# Patient Record
Sex: Male | Born: 1960 | Race: White | Hispanic: No | Marital: Married | Smoking: Current every day smoker
Health system: Southern US, Community
[De-identification: ages and names within clinical notes are randomized; demographics above are authoritative.]

## PROBLEM LIST (undated history)

## (undated) DIAGNOSIS — F1320 Sedative, hypnotic or anxiolytic dependence, uncomplicated: Secondary | ICD-10-CM

## (undated) DIAGNOSIS — E538 Deficiency of other specified B group vitamins: Secondary | ICD-10-CM

## (undated) DIAGNOSIS — G459 Transient cerebral ischemic attack, unspecified: Secondary | ICD-10-CM

## (undated) DIAGNOSIS — F1120 Opioid dependence, uncomplicated: Secondary | ICD-10-CM

## (undated) DIAGNOSIS — I255 Ischemic cardiomyopathy: Secondary | ICD-10-CM

## (undated) DIAGNOSIS — F1910 Other psychoactive substance abuse, uncomplicated: Secondary | ICD-10-CM

## (undated) DIAGNOSIS — I509 Heart failure, unspecified: Secondary | ICD-10-CM

## (undated) DIAGNOSIS — F329 Major depressive disorder, single episode, unspecified: Secondary | ICD-10-CM

## (undated) DIAGNOSIS — I219 Acute myocardial infarction, unspecified: Secondary | ICD-10-CM

## (undated) DIAGNOSIS — N400 Enlarged prostate without lower urinary tract symptoms: Secondary | ICD-10-CM

## (undated) DIAGNOSIS — G8929 Other chronic pain: Secondary | ICD-10-CM

## (undated) DIAGNOSIS — Z95810 Presence of automatic (implantable) cardiac defibrillator: Secondary | ICD-10-CM

## (undated) DIAGNOSIS — F419 Anxiety disorder, unspecified: Secondary | ICD-10-CM

## (undated) DIAGNOSIS — Z8547 Personal history of malignant neoplasm of testis: Secondary | ICD-10-CM

## (undated) DIAGNOSIS — F32A Depression, unspecified: Secondary | ICD-10-CM

## (undated) DIAGNOSIS — I2510 Atherosclerotic heart disease of native coronary artery without angina pectoris: Secondary | ICD-10-CM

## (undated) DIAGNOSIS — M549 Dorsalgia, unspecified: Secondary | ICD-10-CM

## (undated) DIAGNOSIS — M1990 Unspecified osteoarthritis, unspecified site: Secondary | ICD-10-CM

## (undated) DIAGNOSIS — G4733 Obstructive sleep apnea (adult) (pediatric): Secondary | ICD-10-CM

## (undated) DIAGNOSIS — D751 Secondary polycythemia: Secondary | ICD-10-CM

## (undated) HISTORY — DX: Other chronic pain: G89.29

## (undated) HISTORY — DX: Personal history of malignant neoplasm of testis: Z85.47

## (undated) HISTORY — DX: Benign prostatic hyperplasia without lower urinary tract symptoms: N40.0

## (undated) HISTORY — DX: Ischemic cardiomyopathy: I25.5

## (undated) HISTORY — DX: Deficiency of other specified B group vitamins: E53.8

## (undated) HISTORY — DX: Major depressive disorder, single episode, unspecified: F32.9

## (undated) HISTORY — DX: Secondary polycythemia: D75.1

## (undated) HISTORY — DX: Heart failure, unspecified: I50.9

## (undated) HISTORY — DX: Unspecified osteoarthritis, unspecified site: M19.90

## (undated) HISTORY — DX: Opioid dependence, uncomplicated: F11.20

## (undated) HISTORY — DX: Presence of automatic (implantable) cardiac defibrillator: Z95.810

## (undated) HISTORY — DX: Atherosclerotic heart disease of native coronary artery without angina pectoris: I25.10

## (undated) HISTORY — PX: ASD REPAIR, SINUS VENOSUS: SHX1196

## (undated) HISTORY — PX: TESTICLE SURGERY: SHX794

## (undated) HISTORY — DX: Depression, unspecified: F32.A

## (undated) HISTORY — DX: Sedative, hypnotic or anxiolytic dependence, uncomplicated: F13.20

## (undated) HISTORY — DX: Anxiety disorder, unspecified: F41.9

## (undated) HISTORY — DX: Chronic systolic (congestive) heart failure: I50.22

## (undated) HISTORY — DX: Dorsalgia, unspecified: M54.9

## (undated) HISTORY — PX: HERNIA REPAIR: SHX51

---

## 2002-12-08 ENCOUNTER — Ambulatory Visit (HOSPITAL_COMMUNITY): Admission: RE | Admit: 2002-12-08 | Discharge: 2002-12-08 | Payer: Self-pay | Admitting: Cardiology

## 2003-08-13 ENCOUNTER — Ambulatory Visit (HOSPITAL_COMMUNITY): Admission: RE | Admit: 2003-08-13 | Discharge: 2003-08-13 | Payer: Self-pay | Admitting: Cardiology

## 2004-10-25 ENCOUNTER — Emergency Department (HOSPITAL_COMMUNITY): Admission: EM | Admit: 2004-10-25 | Discharge: 2004-10-25 | Payer: Self-pay | Admitting: Emergency Medicine

## 2005-05-17 ENCOUNTER — Observation Stay (HOSPITAL_COMMUNITY): Admission: EM | Admit: 2005-05-17 | Discharge: 2005-05-18 | Payer: Self-pay | Admitting: Emergency Medicine

## 2005-05-20 ENCOUNTER — Emergency Department (HOSPITAL_COMMUNITY): Admission: EM | Admit: 2005-05-20 | Discharge: 2005-05-20 | Payer: Self-pay | Admitting: Emergency Medicine

## 2005-07-24 ENCOUNTER — Ambulatory Visit: Payer: Self-pay | Admitting: Oncology

## 2005-08-04 ENCOUNTER — Ambulatory Visit (HOSPITAL_COMMUNITY): Admission: RE | Admit: 2005-08-04 | Discharge: 2005-08-04 | Payer: Self-pay | Admitting: *Deleted

## 2005-08-05 ENCOUNTER — Ambulatory Visit (HOSPITAL_COMMUNITY): Admission: RE | Admit: 2005-08-05 | Discharge: 2005-08-05 | Payer: Self-pay | Admitting: *Deleted

## 2006-05-26 ENCOUNTER — Encounter: Admission: RE | Admit: 2006-05-26 | Discharge: 2006-05-26 | Payer: Self-pay | Admitting: Specialist

## 2006-09-17 ENCOUNTER — Ambulatory Visit: Payer: Self-pay | Admitting: Critical Care Medicine

## 2006-09-17 ENCOUNTER — Inpatient Hospital Stay (HOSPITAL_COMMUNITY): Admission: EM | Admit: 2006-09-17 | Discharge: 2006-09-20 | Payer: Self-pay | Admitting: Emergency Medicine

## 2006-09-29 ENCOUNTER — Ambulatory Visit: Payer: Self-pay | Admitting: Critical Care Medicine

## 2007-02-09 ENCOUNTER — Ambulatory Visit: Payer: Self-pay | Admitting: Internal Medicine

## 2007-02-15 ENCOUNTER — Ambulatory Visit (HOSPITAL_COMMUNITY): Admission: RE | Admit: 2007-02-15 | Discharge: 2007-02-15 | Payer: Self-pay | Admitting: Internal Medicine

## 2010-07-30 ENCOUNTER — Telehealth (INDEPENDENT_AMBULATORY_CARE_PROVIDER_SITE_OTHER): Payer: Self-pay | Admitting: *Deleted

## 2010-08-03 ENCOUNTER — Encounter: Payer: Self-pay | Admitting: Specialist

## 2010-08-03 ENCOUNTER — Encounter: Payer: Self-pay | Admitting: *Deleted

## 2010-08-07 ENCOUNTER — Telehealth (INDEPENDENT_AMBULATORY_CARE_PROVIDER_SITE_OTHER): Payer: Self-pay | Admitting: *Deleted

## 2010-08-07 ENCOUNTER — Inpatient Hospital Stay (HOSPITAL_COMMUNITY)
Admission: EM | Admit: 2010-08-07 | Discharge: 2010-08-09 | Payer: Self-pay | Source: Home / Self Care | Attending: Cardiology | Admitting: Cardiology

## 2010-08-07 LAB — COMPREHENSIVE METABOLIC PANEL WITH GFR
ALT: 23 U/L (ref 0–53)
AST: 16 U/L (ref 0–37)
Albumin: 4.3 g/dL (ref 3.5–5.2)
Alkaline Phosphatase: 72 U/L (ref 39–117)
BUN: 16 mg/dL (ref 6–23)
CO2: 24 meq/L (ref 19–32)
Calcium: 9.1 mg/dL (ref 8.4–10.5)
Chloride: 104 meq/L (ref 96–112)
Creatinine, Ser: 1.11 mg/dL (ref 0.4–1.5)
GFR calc non Af Amer: >60 mL/min
Glucose, Bld: 100 mg/dL — ABNORMAL HIGH (ref 70–99)
Potassium: 4.2 meq/L (ref 3.5–5.1)
Sodium: 135 meq/L (ref 135–145)
Total Bilirubin: 0.3 mg/dL (ref 0.3–1.2)
Total Protein: 7.2 g/dL (ref 6.0–8.3)

## 2010-08-07 LAB — CBC
HCT: 51 % (ref 39.0–52.0)
Hemoglobin: 16.6 g/dL (ref 13.0–17.0)
MCH: 29.8 pg (ref 26.0–34.0)
MCHC: 32.5 g/dL (ref 30.0–36.0)
MCV: 91.6 fL (ref 78.0–100.0)
Platelets: 153 10*3/uL (ref 150–400)
RBC: 5.57 MIL/uL (ref 4.22–5.81)
RDW: 13.4 % (ref 11.5–15.5)
WBC: 6.8 10*3/uL (ref 4.0–10.5)

## 2010-08-07 LAB — DIFFERENTIAL
Basophils Absolute: 0 10*3/uL (ref 0.0–0.1)
Basophils Relative: 0 % (ref 0–1)
Eosinophils Absolute: 0.3 10*3/uL (ref 0.0–0.7)
Eosinophils Relative: 4 % (ref 0–5)
Lymphocytes Relative: 35 % (ref 12–46)
Lymphs Abs: 2.4 10*3/uL (ref 0.7–4.0)
Monocytes Absolute: 0.4 10*3/uL (ref 0.1–1.0)
Monocytes Relative: 6 % (ref 3–12)
Neutro Abs: 3.8 10*3/uL (ref 1.7–7.7)
Neutrophils Relative %: 55 % (ref 43–77)

## 2010-08-07 LAB — POCT I-STAT 3, VENOUS BLOOD GAS (G3P V)
Acid-Base Excess: 1 mmol/L (ref 0.0–2.0)
Bicarbonate: 25.6 mEq/L — ABNORMAL HIGH (ref 20.0–24.0)
O2 Saturation: 97 %
TCO2: 27 mmol/L (ref 0–100)
pCO2, Ven: 40.6 mmHg — ABNORMAL LOW (ref 45.0–50.0)
pH, Ven: 7.409 — ABNORMAL HIGH (ref 7.250–7.300)
pO2, Ven: 92 mmHg — ABNORMAL HIGH (ref 30.0–45.0)

## 2010-08-07 LAB — CK TOTAL AND CKMB (NOT AT ARMC)
CK, MB: 3 ng/mL (ref 0.3–4.0)
Relative Index: 2.7 — ABNORMAL HIGH (ref 0.0–2.5)
Total CK: 111 U/L (ref 7–232)

## 2010-08-07 LAB — RAPID URINE DRUG SCREEN, HOSP PERFORMED
Amphetamines: NOT DETECTED
Barbiturates: NOT DETECTED
Benzodiazepines: POSITIVE — AB
Cocaine: NOT DETECTED
Opiates: POSITIVE — AB
Tetrahydrocannabinol: POSITIVE — AB

## 2010-08-07 LAB — APTT: aPTT: 29 s (ref 24–37)

## 2010-08-07 LAB — HEMOGLOBIN A1C
Hgb A1c MFr Bld: 5.7 % — ABNORMAL HIGH
Mean Plasma Glucose: 117 mg/dL — ABNORMAL HIGH

## 2010-08-07 LAB — PROTIME-INR
INR: 1.01 (ref 0.00–1.49)
Prothrombin Time: 13.5 s (ref 11.6–15.2)

## 2010-08-07 LAB — TSH: TSH: 0.757 u[IU]/mL (ref 0.350–4.500)

## 2010-08-07 LAB — TROPONIN I: Troponin I: <0.01 ng/mL (ref 0.00–0.06)

## 2010-08-07 LAB — BRAIN NATRIURETIC PEPTIDE: Pro B Natriuretic peptide (BNP): 66 pg/mL (ref 0.0–100.0)

## 2010-08-08 LAB — POCT ACTIVATED CLOTTING TIME
Activated Clotting Time: 140 seconds
Activated Clotting Time: 605 seconds

## 2010-08-08 LAB — CBC
HCT: 49 % (ref 39.0–52.0)
HCT: 50.8 % (ref 39.0–52.0)
Hemoglobin: 17 g/dL (ref 13.0–17.0)
Hemoglobin: 17.5 g/dL — ABNORMAL HIGH (ref 13.0–17.0)
MCH: 31.5 pg (ref 26.0–34.0)
MCH: 31.5 pg (ref 26.0–34.0)
MCHC: 34.7 g/dL (ref 30.0–36.0)
MCHC: 34.4 g/dL (ref 30.0–36.0)
MCV: 90.7 fL (ref 78.0–100.0)
MCV: 91.5 fL (ref 78.0–100.0)
Platelets: 141 10*3/uL — ABNORMAL LOW (ref 150–400)
Platelets: 138 10*3/uL — ABNORMAL LOW (ref 150–400)
RBC: 5.4 MIL/uL (ref 4.22–5.81)
RBC: 5.55 MIL/uL (ref 4.22–5.81)
RDW: 13.5 % (ref 11.5–15.5)
RDW: 13.5 % (ref 11.5–15.5)
WBC: 6.1 10*3/uL (ref 4.0–10.5)
WBC: 5.9 10*3/uL (ref 4.0–10.5)

## 2010-08-08 LAB — HEPARIN LEVEL (UNFRACTIONATED)
Heparin Unfractionated: 0.16 IU/mL — ABNORMAL LOW (ref 0.30–0.70)
Heparin Unfractionated: 0.63 IU/mL (ref 0.30–0.70)
Heparin Unfractionated: <0.1 IU/mL — ABNORMAL LOW (ref 0.30–0.70)

## 2010-08-08 LAB — PLATELET INHIBITION P2Y12
P2Y12 % Inhibition: 28 %
Platelet Function  P2Y12: 128 [PRU] — ABNORMAL LOW (ref 194–418)
Platelet Function Baseline: 177 [PRU] — ABNORMAL LOW (ref 194–418)

## 2010-08-08 LAB — CARDIAC PANEL(CRET KIN+CKTOT+MB+TROPI)
CK, MB: 2.4 ng/mL (ref 0.3–4.0)
CK, MB: 3 ng/mL (ref 0.3–4.0)
CK, MB: 2.7 ng/mL (ref 0.3–4.0)
Relative Index: 2.4 (ref 0.0–2.5)
Relative Index: 2.9 — ABNORMAL HIGH (ref 0.0–2.5)
Relative Index: INVALID (ref 0.0–2.5)
Total CK: 100 U/L (ref 7–232)
Total CK: 104 U/L (ref 7–232)
Total CK: 98 U/L (ref 7–232)
Troponin I: 0.01 ng/mL (ref 0.00–0.06)
Troponin I: <0.01 ng/mL (ref 0.00–0.06)
Troponin I: <0.01 ng/mL (ref 0.00–0.06)

## 2010-08-08 LAB — GLUCOSE, CAPILLARY: Glucose-Capillary: 105 mg/dL — ABNORMAL HIGH (ref 70–99)

## 2010-08-09 LAB — CBC
HCT: 48.6 % (ref 39.0–52.0)
Hemoglobin: 16.6 g/dL (ref 13.0–17.0)
MCH: 30.9 pg (ref 26.0–34.0)
MCHC: 34.2 g/dL (ref 30.0–36.0)
MCV: 90.5 fL (ref 78.0–100.0)
Platelets: 138 10*3/uL — ABNORMAL LOW (ref 150–400)
RBC: 5.37 MIL/uL (ref 4.22–5.81)
RDW: 13.5 % (ref 11.5–15.5)
WBC: 7.1 10*3/uL (ref 4.0–10.5)

## 2010-08-09 LAB — BASIC METABOLIC PANEL
BUN: 13 mg/dL (ref 6–23)
CO2: 26 mEq/L (ref 19–32)
Calcium: 9 mg/dL (ref 8.4–10.5)
Chloride: 104 mEq/L (ref 96–112)
Creatinine, Ser: 1.08 mg/dL (ref 0.4–1.5)
GFR calc Af Amer: >60 mL/min (ref >60)
GFR calc non Af Amer: >60 mL/min (ref >60)
Glucose, Bld: 99 mg/dL (ref 70–99)
Potassium: 3.9 mEq/L (ref 3.5–5.1)
Sodium: 139 mEq/L (ref 135–145)

## 2010-08-12 ENCOUNTER — Encounter: Payer: Self-pay | Admitting: Internal Medicine

## 2010-08-12 ENCOUNTER — Telehealth: Payer: Self-pay | Admitting: Internal Medicine

## 2010-08-13 ENCOUNTER — Observation Stay (HOSPITAL_COMMUNITY)
Admission: RE | Admit: 2010-08-13 | Discharge: 2010-08-14 | Disposition: A | Discharge status: Home / Self Care | Payer: BC Managed Care – PPO | Source: Ambulatory Visit | Attending: Internal Medicine | Admitting: Internal Medicine

## 2010-08-13 DIAGNOSIS — G8929 Other chronic pain: Secondary | ICD-10-CM | POA: Insufficient documentation

## 2010-08-13 DIAGNOSIS — I2589 Other forms of chronic ischemic heart disease: Principal | ICD-10-CM | POA: Insufficient documentation

## 2010-08-13 DIAGNOSIS — M4800 Spinal stenosis, site unspecified: Secondary | ICD-10-CM | POA: Insufficient documentation

## 2010-08-13 DIAGNOSIS — I498 Other specified cardiac arrhythmias: Secondary | ICD-10-CM | POA: Insufficient documentation

## 2010-08-13 DIAGNOSIS — Z8547 Personal history of malignant neoplasm of testis: Secondary | ICD-10-CM | POA: Insufficient documentation

## 2010-08-13 DIAGNOSIS — G4733 Obstructive sleep apnea (adult) (pediatric): Secondary | ICD-10-CM | POA: Insufficient documentation

## 2010-08-13 DIAGNOSIS — Z7902 Long term (current) use of antithrombotics/antiplatelets: Secondary | ICD-10-CM | POA: Insufficient documentation

## 2010-08-13 DIAGNOSIS — I2510 Atherosclerotic heart disease of native coronary artery without angina pectoris: Secondary | ICD-10-CM | POA: Insufficient documentation

## 2010-08-13 DIAGNOSIS — Z79899 Other long term (current) drug therapy: Secondary | ICD-10-CM | POA: Insufficient documentation

## 2010-08-13 DIAGNOSIS — F1920 Other psychoactive substance dependence, uncomplicated: Secondary | ICD-10-CM | POA: Insufficient documentation

## 2010-08-13 DIAGNOSIS — F17200 Nicotine dependence, unspecified, uncomplicated: Secondary | ICD-10-CM | POA: Insufficient documentation

## 2010-08-13 DIAGNOSIS — F1320 Sedative, hypnotic or anxiolytic dependence, uncomplicated: Secondary | ICD-10-CM | POA: Insufficient documentation

## 2010-08-13 DIAGNOSIS — F329 Major depressive disorder, single episode, unspecified: Secondary | ICD-10-CM | POA: Insufficient documentation

## 2010-08-13 DIAGNOSIS — N400 Enlarged prostate without lower urinary tract symptoms: Secondary | ICD-10-CM | POA: Insufficient documentation

## 2010-08-13 DIAGNOSIS — F3289 Other specified depressive episodes: Secondary | ICD-10-CM | POA: Insufficient documentation

## 2010-08-13 HISTORY — PX: CARDIAC DEFIBRILLATOR PLACEMENT: SHX171

## 2010-08-13 LAB — SURGICAL PCR SCREEN
MRSA, PCR: NEGATIVE
Staphylococcus aureus: NEGATIVE

## 2010-08-14 ENCOUNTER — Inpatient Hospital Stay (HOSPITAL_COMMUNITY): Payer: BC Managed Care – PPO

## 2010-08-14 NOTE — Progress Notes (Signed)
  Request for Records received form [facility] Rotenstre[facility] [place] sent to [employer]  July 30, 2010 12:51 PM

## 2010-08-14 NOTE — Progress Notes (Signed)
Summary: pt's dad needs to speak with [name]  Phone Note Call from Patient   Caller: [phone] pt's [name] Reason for Call: Talk to Nurse Summary of Call: pt's dad [name] needs to talk with you re pt, said he has been seen here before, but I don't see any records of it Initial call taken by: [name],  August 07, 2010 1:28 PM  Follow-up for Phone Call        per family request  will go see pt in the ER [name] and [name] aware [name], RN, BSN  August 07, 2010 2:06 PM

## 2010-08-15 ENCOUNTER — Telehealth: Payer: Self-pay | Admitting: Internal Medicine

## 2010-08-18 ENCOUNTER — Encounter: Payer: Self-pay | Admitting: Internal Medicine

## 2010-08-20 NOTE — Progress Notes (Signed)
Summary: question re procedure  Phone Note Call from Patient Call back at [phone]   Caller: Patient Reason for Call: Talk to Nurse Summary of Call: pt has question re his procedure done in the hospital Cardiac catheterization pacermaker. pt has question re what he needs to do next. pt wants to know what he does re the bandage/can he shower? Initial call taken by: Regan Lemming,  August 15, 2010 11:05 AM  Follow-up for Phone Call        Pt. has discharge instructions from hospital.  Pt still has large bandage over defib insertion site. I told pt he could remove this dressing as it had been 24 hours after insertion.  He is aware there are steri strips under dressing and understands instructions as on dc sheet for these.  Aware he can not shower for one week.  Pt also asking regarding Merlin. Pt informed this will be started after follow up appt with Dr. Caryl Comes and he will be given information on this at office visit with Dr. Caryl Comes. Follow-up by: Thompson Grayer, RN, BSN,  August 15, 2010 11:34 AM

## 2010-08-20 NOTE — Progress Notes (Signed)
Summary: pt needs know if he is getting defib tomorrow  Phone Note Call from Patient Call back at [phone]   Caller: Patient Reason for Call: Talk to Nurse, Talk to Doctor Summary of Call: pt thinks he is to get a defib implanted tomorrow but he has not heard anything Initial call taken by: Shelda Pal,  August 12, 2010 12:32 PM  Follow-up for Phone Call        08/12/10-1320pm--pt calling wanting to know if defib implant is going to happen tomorrow--after speaking with dr Caryl Comes i went ahead and sched procedure for 1pm 08/13/10 with anesthesia available--i also notified pt to be at short stay,NPO, at 11am for 100pm procedure--pt agrees Follow-up by: Leodis Sias, RN,  August 12, 2010 1:21 PM

## 2010-08-27 ENCOUNTER — Ambulatory Visit: Payer: BC Managed Care – PPO

## 2010-08-28 NOTE — Miscellaneous (Signed)
Summary: Device preload  Clinical Lists Changes  Observations: Added new observation of ICD INDICATN: ICM (08/18/2010 12:27) Added new observation of ICDLEADSTAT2: active (08/18/2010 12:27) Added new observation of ICDLEADSER2: [id number] (08/18/2010 12:27) Added new observation of ICDLEADMOD2: 1423T (08/18/2010 12:27) Added new observation of ICDLEADLOC2: RV (08/18/2010 12:27) Added new observation of ICDLEADSTAT1: active (08/18/2010 12:27) Added new observation of ICDLEADSER1: [id number] (08/18/2010 12:27) Added new observation of ICDLEADMOD1: 5686HU (08/18/2010 12:27) Added new observation of ICDLEADDOI2: 08/13/2010 (08/18/2010 12:27) Added new observation of ICDLEADDOI1: 08/13/2010 (08/18/2010 12:27) Added new observation of ICDLEADLOC1: RA (08/18/2010 12:27) Added new observation of ICD IMP MD: Virl Axe, MD (08/18/2010 12:27) Added new observation of ICD IMPL DTE: 08/13/2010 (08/18/2010 12:27) Added new observation of ICD SERL#: [id number]  (08/18/2010 12:27) Added new observation of ICD MODL#: [id number]  (08/18/2010 12:27) Added new observation of ICDMANUFACTR: St Jude  (08/18/2010 12:27) Added new observation of ICD MD: Virl Axe, MD  (08/18/2010 12:27)       ICD Specifications Following MD:  Virl Axe, MD     ICD Vendor:  St Jude     ICD Model Number:  [id number]     ICD Serial Number:  [id number] ICD DOI:  08/13/2010     ICD Implanting MD:  Virl Axe, MD  Lead 1:    Location: RA     DOI: 08/13/2010     Model #: [id number]     Serial #: [id number]     Status: active Lead 2:    Location: RV     DOI: 08/13/2010     Model #: [id number]     Serial #: [id number]     Status: active  Indications::  ICM

## 2010-09-01 NOTE — Op Note (Signed)
[name], [name]                ACCOUNT NO.:  [id number]  MEDICAL RECORD NO.:  [id number]           PATIENT TYPE:  I  LOCATION:  2035                         FACILITY:  [facility]  PHYSICIAN:  Deboraha Sprang, MD, FACCDATE OF BIRTH:  1961/05/11  DATE OF PROCEDURE:  08/13/2010 DATE OF DISCHARGE:                              OPERATIVE REPORT   PREOPERATIVE DIAGNOSES:  Ischemic cardiomyopathy and depressed left ventricular function and some degree of bradycardia.  POSTOPERATIVE DIAGNOSES:  Ischemic cardiomyopathy and depressed left ventricular function and some degree of bradycardia.  PROCEDURE:  Dual-chamber defibrillator implantation with intraoperative defibrillation threshold testing.  Following obtaining informed consent, the patient was brought to electrophysiology laboratory and placed on the fluoroscopic table in the supine position.  After routine prep and drape of the left upper chest and under sedation delivered by Dr. Myriam Jacobson, the patient was prepped and draped and then, the patient's left subclavian area was locally anesthetized.  An incision was made and carried down to layer of the prepectoral fascia.  Using electrocautery and sharp dissection, a pocket was formed similarly.  Hemostasis was obtained.  Thereafter, attention was turned to gaining access to the external thoracic left subclavian vein which was accomplished with mild difficulty, but without the aspiration of air or puncture of the artery. Two separate venipunctures were accomplished, guidewires were placed and retained, and sequentially an 8-French and 7-French sheaths were placed which were pass a St. Jude Durata single coil defibrillator lead, model [id number], serial number [id number] and a St. Jude 2080 TC asphyxiation atrial lead, serial number [id number].  Under fluoroscopic guidance, these were manipulated to the right ventricular apex and the right atrial appendage respectively with a bipolar R-wave of  1.6 with a pace impedance of 649, threshold 0.5 at 0.5, current of threshold 0.7 mA. There was no diaphragmatic pacing at 10 V and the current of injury was brisk.  Bipolar P-wave is 3.6 with a pace impedance of 674, a threshold of 1.2 V at 0.5 msec, current of threshold 1.7 mA and there was no diaphragmatic pacing at 10 volts, the current of injury was brisk.  With these acceptable parameters recorded, the leads were attached to a [place]lock [id number] ICD, serial number [id number].  Through the device, bipolar P-wave was 2.8 with a pace impedance of 630, a threshold of 1.75 at 0.5 and the R-wave was 12 with a pace impedance of 6 and a threshold of 0.5 at 0.5, high-voltage impedance was 72 ohms.  With these acceptable parameters recorded, defibrillation threshold testing was undertaken.  Ventricular fibrillation was induced via the T- wave shock.  After a total duration of 6 seconds, the 15 J shock was delivered through a measured resistance of 72 ohms, terminating ventricular fibrillation and restoring sinus rhythm.  The device was implanted.  The pocket was copiously irrigated with antibiotic- containing saline solution.  Hemostasis was assured.  The leads and the pulse generator were placed in the pocket secured to the prepectoral fascia.  The wound was closed in 2 layers in the normal fashion.  The wound was washed, dried, and Benzoin, Steri-Strip dressing  was applied.  Needle counts, sponge counts, and instrument counts were correct at the end of the procedure according to staff, and the patient tolerated the procedure without apparent complication.     Deboraha Sprang, MD, [facility]     SCK/MEDQ  D:  08/13/2010  T:  08/14/2010  Job:  [id number]  Electronically Signed by Virl Axe MD [facility] on 09/01/2010 09:54:25 PM

## 2010-09-01 NOTE — Discharge Summary (Signed)
Sandoval, Trevor                ACCOUNT NO.:  [id number]  MEDICAL RECORD NO.:  [id number]           PATIENT TYPE:  I  LOCATION:  2035                         FACILITY:  [facility]  PHYSICIAN:  Deboraha Sprang, MD, FACCDATE OF BIRTH:  Aug 30, 1960  DATE OF ADMISSION:  08/13/2010 DATE OF DISCHARGE:  08/14/2010                              DISCHARGE SUMMARY   PRIMARY DIAGNOSIS:  Ischemic cardiomyopathy with ejection fraction of 20% to 30%.  SECONDARY DIAGNOSES: 1. Ventricular ectopy. 2. Tobacco abuse. 3. Chronic narcotic and benzodiazepine dependence. 4. Chronic back pain from lumbar stenosis. 5. Obstructive sleep apnea. 6. Benign prostatic hypertrophy. 7. Depression. 8. History of testicular cancer.  ALLERGIES:  The patient is allergic to NUBAIN.  PROCEDURES THIS ADMISSION:  Implantation of a dual-chamber St. Jude Medical ICD on August 13, 2010, by Dr. Caryl Comes.  The patient received a model number 6734-19F ICD with a model number 2088TC right atrial lead, model number 7902I right ventricular lead.  DFTs at the time of implant were less than or equal to 15 joules.  The patient had no early apparent complications.  He was enrolled in the AnalyzeST study.  BRIEF HISTORY OF PRESENT ILLNESS:  [name] is a 50 year old male with a history of ischemic cardiomyopathy.  For him, ICD implantation had been recommended, but deferred in the past.  He was admitted in January 2012 with chest pain and underwent cardiac catheterization at that time. There were no options for revascularizations.  He was evaluated by Dr. Caryl Comes that admission for risk stratification for sudden death. Recommendation included implantation of primary prevention ICD.  Risks, benefits, and alternatives of the procedure were discussed with the patient and he wished to proceed.  HOSPITAL COURSE:  The patient was admitted on August 13, 2010, with planned implantation of ICD.  This was carried out by Dr. Caryl Comes  with details as outlined above.  He was monitored on telemetry overnight, which demonstrated sinus rhythm.  His device was interrogated and found to be functioning normally.  The patient was enrolled in the Analyze ST trial.  The patient's left chest was without hematoma or ecchymosis. Dr. Caryl Comes examined the patient on August 14, 2010, and considered him stable for discharge with plans to begin Aldactone upon discharge and obtaining a BMET at [facility] appointment.  FOLLOWUP APPOINTMENTS: 1. [facility] on August 27, 2010, at 2:30 p.m. 2. Dr. Caryl Comes in May 2012 - the office will call to schedule the     appointment.  DISCHARGE INSTRUCTIONS: 1. Increase activity slowly. 2. No driving for 1 week. 3. Follow low-sodium, heart-healthy diet. 4. See supplemental device discharge instructions for wound care and     arm mobility. 5. Keep incision clean and dry for 1 week. 6. No jacuzzi or swimming for 6 weeks.  DISCHARGE MEDICATIONS: 1. Aldactone 25 mg one-half tablet daily - this is a new prescription    for the patient. 2. Aspirin 81 mg daily. 3. Crestor 40 mg daily. 4. Carvedilol 6.25 mg twice daily. 5. Dilaudid 8 mg every 4 hours. 6. Imdur 30 mg daily. 7. Lactulose 1 teaspoonful daily as  needed for constipation. 8. Lisinopril 5 mg daily. 9. Morphine CR 60 mg every 8 hours. 10.Nitroglycerin spray as needed for chest pain. 11.Plavix 75 mg daily. 12.Uroxatral 10 mg daily as needed. 13.Valium 10 mg 4 times daily.  DISPOSITION:  The patient was seen and examined by Dr. Caryl Comes on August 14, 2010, and considered stable for discharge.  DURATION OF DISCHARGE ENCOUNTER:  35 minutes.     Chanetta Marshall, RN,BSN   ______________________________ Deboraha Sprang, MD, [facility]    AS/MEDQ  D:  08/14/2010  T:  08/14/2010  Job:  [id number]  Electronically Signed by Chanetta Marshall RNBSN on 08/18/2010 11:38:58 AM Electronically Signed by Virl Axe MD [place] on 09/01/2010  09:54:21 PM

## 2010-09-01 NOTE — Discharge Summary (Signed)
NAMEJOHANAN, SKORUPSKI                ACCOUNT NO.:  [id number]  MEDICAL RECORD NO.:  [id number]          PATIENT TYPE:  INP  LOCATION:  4496                         FACILITY:  [facility]  PHYSICIAN:  Deboraha Sprang, MD, FACCDATE OF BIRTH:  May 09, 1961  DATE OF ADMISSION:  08/07/2010 DATE OF DISCHARGE:  08/09/2010                              DISCHARGE SUMMARY   PROCEDURES: 1. Cardiac catheterization. 2. Coronary arteriogram. 3. Left ventriculogram.  PRIMARY FINAL DISCHARGE DIAGNOSIS:  Anginal pain, medical therapy recommended for coronary artery disease.  SECONDARY DIAGNOSES: 1. Ischemic cardiomyopathy with an ejection fraction previously of     about 20%. 2. History of catheterization this admission showing circumflex     chronically totalled, posterior descending artery/posterolateral     95%, left anterior descending artery 60-70% and between 30 and 50%     disease in the right coronary artery, diagonal, obtuse marginal,     and posterior descending artery. 3. Remote history of stent to the circumflex in 2002. 4. 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 5. Obstructive sleep apnea. 6. History of lumbar stenosis and chronic pain issues. 7. Remote history of testicular cancer. 8. Benign prostatic hypertrophy. 9. Allergy or intolerance to DARVOCET, DEMEROL, NUBAIN, PENTAZOCINE,     NALOXONE, NALTREXONE, BUPRENORPHINE and STADOL. 10.Status post orchiectomy for testicular cancer as well as hernia     repair and sinus surgery. 11.History of tobacco use. 12.Family history of either coronary artery disease or sudden death in     his father at age 108.  TIME AT DISCHARGE:  Greater than 30 minutes.  HOSPITAL COURSE:  Mr. Mickelson is a 50 year old male with a history of coronary artery disease.  He had chest pain and came to the hospital where he was admitted for further evaluation.  His cardiac enzymes were negative for MI.  Hemoglobin A1c was 5.7. Urine drug screen was  positive for benzodiazepines, opiates, and THC. His platelet count was slightly low showing mild thrombocytopenia and ranged between 138, 000 and 141,000.  He had a cardiac catheterization on August 08, 2010, with the results described above.  The films were reviewed by Dr. Haroldine Laws and Dr. Lia Foyer.  Smoking cessation was strongly advised.  A smoking cessation consult was called.  His volume status was monitored carefully and was stable.  He has a history of ischemic cardiomyopathy and left ventricular dysfunction, so since his EF was 20% in cath Dr. Caryl Comes was asked to assess him for possible ICD.  On August 09, 2010, Mr. Deis was seen by Dr. Caryl Comes.  Dr. Caryl Comes felt that he was a candidate for ICD for primary prevention.  He is considered for a trial, but will need anesthesia for DFT testing.  It is possible that there is a need for Aldactone, but this decision can be made as an outpatient.  On August 09, 2010, Mr. Holness was considered stable for discharge, to come back on August 13, 2010 for an ICD.  DISCHARGE INSTRUCTIONS: 1. His activity level is to be increased gradually with no driving for     2 days and no lifting for a week. 2. He  is to call our office for problems with the cath site. 3. He is encouraged to stick to a low-sodium, heart-healthy diet. 4. He is to return on August 13, 2010 as directed for ICD insertion. 5. He is encouraged to obtain a primary care physician in the     [place] area since he is in the process of moving here. 6. He is to follow up with Dr. Haroldine Laws and Dr. Caryl Comes and     appointments will be arranged.  Discharge medications are dictated     separately.     Rosaria Ferries, PA-C   ______________________________ Deboraha Sprang, MD, [facility]    RB/MEDQ  D:  08/13/2010  T:  08/14/2010  Job:  [id number]  Electronically Signed by Rosaria Ferries PA-C on 08/14/2010 01:20:00 PM Electronically Signed by Virl Axe MD [facility] on 09/01/2010 09:54:14  PM

## 2010-09-01 NOTE — Discharge Summary (Signed)
  NAMEROXIE, [name]                ACCOUNT NO.:  [id number]  MEDICAL RECORD NO.:  [id number]          PATIENT TYPE:  INP  LOCATION:  3846                         FACILITY:  [facility]  PHYSICIAN:  Deboraha Sprang, MD, FACCDATE OF BIRTH:  14-Jan-1961  DATE OF ADMISSION:  08/07/2010 DATE OF DISCHARGE:  08/09/2010                              DISCHARGE SUMMARY   ADDENDUM  DISCHARGE MEDICATIONS: 1. Lactulose 1 teaspoon daily p.r.n. as prior to admission. 2. Lisinopril 5 mg a day. 3. Valium 10 mg q.i.d., prescription for #30 given with the patient     understands there will be no refills. 4. Carvedilol 6.25 mg b.i.d. 5. Crestor 40 mg daily. 6. Isosorbide dinitrate daily. 7. Nitroglycerin sublingual p.r.n. 8. Dilaudid 8 mg q.4 h. p.r.n. as prior to admission. 9. Morphine sulfate CR 60 mg q.8 h. 10.Aspirin 325 mg daily. 11.Plavix 75 mg daily. 12.Uroxatral 10 mg daily p.r.n.     Rosaria Ferries, PA-C   ______________________________ Deboraha Sprang, MD, [facility]    RB/MEDQ  D:  08/09/2010  T:  08/10/2010  Job:  [id number]  cc:   Annabelle Harman, MD Lurline Idol, MD  Electronically Signed by Rosaria Ferries PA-C on 08/13/2010 02:56:54 PM Electronically Signed by Virl Axe MD [facility] on 09/01/2010 09:54:08 PM

## 2010-09-03 ENCOUNTER — Encounter: Payer: Self-pay | Admitting: Internal Medicine

## 2010-09-03 ENCOUNTER — Other Ambulatory Visit: Payer: Self-pay

## 2010-09-03 ENCOUNTER — Ambulatory Visit (INDEPENDENT_AMBULATORY_CARE_PROVIDER_SITE_OTHER): Payer: BC Managed Care – PPO

## 2010-09-03 ENCOUNTER — Encounter (INDEPENDENT_AMBULATORY_CARE_PROVIDER_SITE_OTHER): Payer: BC Managed Care – PPO

## 2010-09-03 DIAGNOSIS — R0989 Other specified symptoms and signs involving the circulatory and respiratory systems: Secondary | ICD-10-CM

## 2010-09-03 DIAGNOSIS — I2589 Other forms of chronic ischemic heart disease: Secondary | ICD-10-CM

## 2010-09-03 DIAGNOSIS — Z79899 Other long term (current) drug therapy: Secondary | ICD-10-CM

## 2010-09-03 NOTE — Procedures (Signed)
[name], [name]                ACCOUNT NO.:  [id number]  MEDICAL RECORD NO.:  [id number]          PATIENT TYPE:  INP  LOCATION:  6531                         FACILITY:  [facility]  PHYSICIAN:  Arturo Morton. Riley Kill, MD, FACCDATE OF BIRTH:  1960-10-21  DATE OF PROCEDURE:  08/08/2010 DATE OF DISCHARGE:                           CARDIAC CATHETERIZATION   [name] has a complex history.  His last catheterization he believes was at [facility] and at that time, he had a patent stent to the intermediate.  He also had a patent posterolateral branch.  He has been evaluated for an ICD.  He presented with an episode of chest pain and developed discomfort with negative enzymes.  ICD has been planned.  He continues to smoke approximately 4 cigarettes a day.  He underwent catheterization by Dr. Gala Romney.  This demonstrated an occluded ramus, occluded posterolateral with a recanalized posterolateral segment of the right coronary artery that has some collateralization from the distal circumflex. The patient had been taken to the holding area as I was in the midst of a prior percutaneous intervention.  We discussed the options with the patient, which included continued medical therapy versus attempted opening his posterolateral segment.  He was agreeable to proceeding.  PROCEDURE:  Attempted percutaneous intervention of a chronically recanalized posterolateral segment.  DESCRIPTION OF THE PROCEDURE:  The patient was brought to the cath lab and prepped and draped.  Using a double glove technique, the previously indwelling sheath was exchanged for a 6-French sheath.  A JR-4 guiding catheter with side holes was utilized.  Bivalirudin was given according to protocol.  300 mg of oral clopidogrel was administered.  The patient was on chronic Plavix.  Following this, we initially attempted to cross with a J-tip traverse wire.  This was ultimately unsuccessful.  A light and medium support hydrophilic PT  Graphix 2 wire was utilized.  We then attempted to feel the wire, we were unable to cross the area which has a chronic dissection with a steep bend in the midst of the lesion. Following this, we attempted to maintain the wire in the false channel, and cross into the bend with a J-tipped traverse wire.  We were at approximately 26 minutes of fluoro time at this time frame, I elected not to use a balloon because the occlusion is nearly a flush occlusion just after the PDA, and given the patient's collaterals, it was felt that we should not proceed with that approach.  Given the fluoro time, efforts were abandoned.  The patient was taken to the holding area and his bivalirudin was stopped.  FINDINGS:  The right coronary artery has multiple areas of luminal irregularity with about 30-40% proximal lesions.  There is an area of marked ectasia.  The vessel is a large caliber leading into the PDA and the PDA is largely without occlusion.  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  Following intermittent administration of nitroglycerin, there was no obvious change at the completion of the procedure.  DISPOSITION:  The patient has  been scheduled apparently for a defibrillator, and I will have Dr. Graciela Husbands look into that in more detail. As he is moving back to the area, we think that the best approach may be to go ahead and do that.     Arturo Morton. Riley Kill, MD, [facility]     TDS/MEDQ  D:  08/08/2010  T:  08/09/2010  Job:  [id number]  cc:   Bevelyn Buckles. Bensimhon, MD Duke Salvia, MD, [facility]  Electronically Signed by Shawnie Pons MD [facility] on 09/03/2010 09:08:34 PM

## 2010-09-03 NOTE — Cardiovascular Report (Signed)
Summary: Pre-Op Orders  Pre-Op Orders   Imported By: Marilynne Drivers 08/29/2010 10:04:43  _____________________________________________________________________  External Attachment:    Type:   Image     Comment:   External Document

## 2010-09-03 NOTE — Procedures (Signed)
Sandoval, Trevor                ACCOUNT NO.:  [id number]  MEDICAL RECORD NO.:  [id number]          PATIENT TYPE:  INP  LOCATION:  6531                         FACILITY:  [facility]  PHYSICIAN:  [name], MDDATE OF BIRTH:  [birth year]  DATE OF PROCEDURE:  08/08/2010 DATE OF DISCHARGE:                           CARDIAC CATHETERIZATION   PRIMARY CARDIOLOGIST:  Dr. Randolm Idol in [name], [place].  PRIMARY CARE PHYSICIAN:  Dr. Raelyn Ensign in [place], [place].  PATIENT IDENTIFICATION:  [name] is a very pleasant 50 year old man with a history of severe coronary artery disease complicated by an ischemic cardiomyopathy with an EF in a 25% range.  He also has history of obstructive sleep apnea, chronic back pain, and mild ongoing tobacco use.  He was admitted with unstable angina.  Cardiac markers have been normal.  In reviewing of his records, he underwent apparently stenting of the circumflex and ramus in the past.  His last catheterization was in 2006.  The LAD had luminal irregularities.  The ramus intermedius stent was patent.  The RCA had luminal irregularities.  Circumflex was totalled after the OM with right-to-left collaterals.  EF was 50% by report.  PROCEDURES PERFORMED: 1. Selective coronary angiography. 2. Left heart cath. 3. Left ventriculogram.  DESCRIPTION OF PROCEDURE:  The risks and indications were explained. Consent was signed and placed on the chart.  Right groin area was prepped and draped in a routine sterile fashion and anesthetized with 1% local lidocaine.  Standard catheters including JL-4, JR-4, and angled pigtail were used.  All catheter exchanges made over wire.  There are no apparent complications.  Left main had an ostial 20% lesion.  LAD coursed to the apex, gave off a moderate-sized diagonal branch in the midsection.  Throughout the proximal and mid LAD, there was approximately 40% tubular narrowing.  In the mid LAD, there was a  60-70% focal lesion.  In the diagonal, there was a 30% tubular lesion.  Left circumflex gave off a ramus branch, small OM-1.  The distal AV groove circ was subtotally occluded which was chronic.  In the ramus branch, there was evidence of a previously placed stent.  This was now totally occluded.  In the OM-1, there was a 40% proximal lesion.  Right coronary artery was a large dominant vessel, had diffuse 40-50% disease throughout.  In the distal RCA, prior to the PDA, there was a tubular 40% lesion.  In the distal RCA just after the takeoff of the PDA, there was a 99% lesion with a near subtotal occlusion.  In the PDA, there was a 50% tubular lesion distally.  Left ventriculogram done in the RAO position showed right and left ventricle was not fully opacified, but LV function was severely depressed with an EF of approximately 20%.  ASSESSMENT: 1. Severe three-vessel coronary artery disease with significant     progression since his previous catheterization. 2. Severe left ventricular dysfunction.  PLAN/DISCUSSION:  I will review the films with Dr. Lia Foyer to discuss whether or not he would benefit from a percutaneous intervention on his distal RCA.  He will also need an ICD.  I have  counseled him on the need to stop smoking.     Shaune Pascal. Harshan Kearley, MD     DRB/MEDQ  D:  08/08/2010  T:  08/09/2010  Job:  [id number]  Electronically Signed by Glori Bickers MD on 09/03/2010 01:54:54 PM

## 2010-09-03 NOTE — H&P (Signed)
Sandoval, Trevor                ACCOUNT NO.:  [id number]  MEDICAL RECORD NO.:  [id number]          PATIENT TYPE:  INP  LOCATION:  2507                         FACILITY:  [facility]  PHYSICIAN:  Arturo Morton. Riley Kill, MD, FACCDATE OF BIRTH:  04/13/1961  DATE OF ADMISSION:  08/07/2010 DATE OF DISCHARGE:                             HISTORY & PHYSICAL   PRIMARY CARE PHYSICIAN:  He was seeing Dr. Vernona Rieger in [place], [place] but has not yet acquired a family physician here.  PRIMARY CARDIOLOGIST:  Dr. Cathlyn Parsons in [place], [place], the patient has not yet obtained one here.  CHIEF COMPLAINT:  Chest pain.  HISTORY OF PRESENT ILLNESS:  [name] is a 50 year old male with a history of coronary artery disease and ischemic cardiomyopathy.  Today, late this morning, he had onset of substernal chest pain.  It reached an 8/10.  It radiated into his jaw.  It was associated with diaphoresis, but no shortness of breath, nausea, or vomiting.  He has had similar symptoms before that were successfully treated with Valium, so he used Valium 10 mg sublingual x2.  The pain had started at rest and when the Valium did not relieve it he used a sublingual nitroglycerin which relieved the pain.  It came back within a few minutes and he again used the sublingual nitroglycerin with relief.  After the third episode of chest pain which was also relieved with nitroglycerin, he called the EMS as his doctors have previously instructed him to do.  As instructed, he took aspirin 81 mg x4.  During transport, he had recurrent chest pain which was treated successfully with sublingual nitroglycerin.  [name] stated the pain had started at rest and he does not usually get chest pain.  Today, he had general malaise on waking and after meeting he napped until 11:30.  The chest pain began shortly after he got up at 11:30.  Currently, in the emergency room, he is pain free and resting comfortably.  PAST MEDICAL  HISTORY: 1. Status post cardiac catheterization here in 2006 showing LAD,     diagonal 1, and ramus intermedius with luminal irregularities, the     ramus intermedius stent was patent, the RCA had luminal     irregularities and 2 focal ectatic areas.  His circumflex was     totalled after the OM with right to left collaterals.  His EF was     50%. 2. Status post stent to the circumflex in 2002. 3. History of ischemic cardiomyopathy with an echocardiogram at the     [facility] in November 2011 showing an EF of 25-30%. 4. 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 5. Obstructive sleep apnea (improved with weight loss). 6. History of lumbar stenosis with chronic pain issues. 7. Remote history of testicular cancer. 8. History of BPH.  SURGICAL HISTORY:  He is status post cardiac catheterization as well as cancer resection with orchiectomy, hernia repair, and sinus surgery.  ALLERGIES:  He is allergic or intolerant to DARVOCET, DEMEROL, NUBAIN, PENTAZOCINE, NALOXONE, NALTREXONE, BUPRENORPHINE, and STADOL.  CURRENT MEDICATIONS: 1. Plavix 75 mg daily. 2. Crestor 40 mg  a day. 3. Imdur 30 mg a day. 4. Lisinopril 5 mg daily. 5. Uroxatral 10 mg daily. 6. Coreg 6.25 mg b.i.d. 7. Morphine SA 60 mg q.8 h. 8. Dilaudid 8 mg q.8 h., the patient is currently is taking q.6 h. 9. Lactulose daily p.r.n. 10.Valium 10 mg q.i.d.  SOCIAL HISTORY:  She lives in [place], having recently moved here from [place].  His wife is coming to live here from [place] very soon. He has worked with the [facility] [employer] [facility] [employer] and multiple other organizations in risks Comptroller.  He has a greater than 30-pack-year history of tobacco use and is down to 4 cigarettes a day.  He denies alcohol or drug abuse.  FAMILY HISTORY:  His mother is alive at age 31 with no heart disease. His father died at 68 with a possible MI, but might have been a sudden death.  No siblings  have coronary artery disease.  REVIEW OF SYSTEMS:  He had night sweats last night.  His weight is down about 60 pounds in the last 3 years.  He has problems with nasal stuffiness.  He uses Afrin on a regular basis.  The chest pain as described above and is a pressure-type sensation.  He has not been coughing or wheezing and has not had fevers or chills.  If he walks for even 5 minutes, he will have numbness in his buttocks and lower extremities.  He has chronic arthralgias and back pain.  He has been having some reflux symptoms recently, but denies melena.  Full 14-point review of systems is otherwise negative except as stated in the HPI.  PHYSICAL EXAMINATION:  VITAL SIGNS:  Temperature is 98.3.  Blood pressure initially 91/63, now 112/75.  Heart rate 69.  Respiratory rate 16.  O2 saturation is 96% on room air. GENERAL:  He is a well-developed, well-nourished white male in no acute distress. HEENT:  Normal. NECK:  There is no lymphadenopathy, thyromegaly, bruit, or JVD noted. CARDIOVASCULAR:  His heart is regular in rate and rhythm with an S1 and S2 and a systolic murmur is noted at the left sternal border.  Distal pulses are intact in all 4 extremities. LUNGS:  Essentially clear to auscultation bilaterally. SKIN:  No rashes or lesions are noted. ABDOMEN:  Soft and nontender with active bowel sounds. EXTREMITIES:  There is no cyanosis, clubbing, or edema noted. MUSCULOSKELETAL:  There is no joint deformity or effusions and no spine or CVA tenderness. NEURO:  He is alert and oriented with cranial nerves II-XII grossly intact.  EKG is sinus rhythm, rate 69 beats per minute with noted left atrial abnormality and a notched P-wave in the leads 2, 3, and aVF.  He has some diffuse inferolateral T-wave flattening.  This is mildly different from an EKG dated November 2006.  LABORATORY VALUES:  Hemoglobin 16.6, hematocrit 51.0, WBCs 6.8, and platelets 153.  INR 1.01.  Sodium 135,  potassium 4.2, chloride 104, CO2 of 24, BUN 16, creatinine 1.11, and glucose 100.  Other LFT values within normal limits.  CK-MB 111/3.0 with a troponin I of less than 0.01 and a BNP of 66.  Chest x-ray is pending.  IMPRESSION:  [name] was seen today by Dr. Riley Kill, the data were reviewed and the situation was discussed with his father.  He had 3 episodes of substernal chest pain relieved with nitroglycerin and Valium.  He has a history of coronary artery disease with prior cath at [facility] and reduced left ventricular function  at the [facility]. He had a TIA workup at [facility] which was thought to be secondary to narcotics.  Currently, he is resting comfortably and pain free.  He is on lower doses of pain medications to control his chronic pain than he was at the time he had the TIA.  On exam, he has no JVD and a few slight rhonchi.  He has an S4 and apical murmur.  He has no edema and no pathologic Q-waves on his EKG.  PLAN: 1. Rule out MI with serial cardiac enzymes.  He will be anticoagulated     with heparin and we will continue him on aspirin. 2. Obtain records from [place]. 3. Dr. Graciela Husbands has been contacted to see [name] in the morning and     evaluate him for possible ICD.  Further evaluation and treatment     will depend on the results of the above testing.     Theodore Demark, PA-C   ______________________________ Arturo Morton. Riley Kill, MD, [facility]    RB/MEDQ  D:  08/07/2010  T:  08/08/2010  Job:  [id number]  Electronically Signed by Theodore Demark PA-C on 08/13/2010 02:56:46 PM Electronically Signed by Shawnie Pons MD [facility] on 09/03/2010 09:08:31 PM

## 2010-09-04 ENCOUNTER — Telehealth (INDEPENDENT_AMBULATORY_CARE_PROVIDER_SITE_OTHER): Payer: Self-pay | Admitting: *Deleted

## 2010-09-04 LAB — BASIC METABOLIC PANEL
BUN: 18 mg/dL (ref 6–23)
CO2: 31 mEq/L (ref 19–32)
Calcium: 9 mg/dL (ref 8.4–10.5)
Chloride: 100 mEq/L (ref 96–112)
Creatinine, Ser: 1.1 mg/dL (ref 0.4–1.5)
GFR: 74.29 mL/min (ref >60.00)
Glucose, Bld: 120 mg/dL — ABNORMAL HIGH (ref 70–99)
Potassium: 3.8 mEq/L (ref 3.5–5.1)
Sodium: 138 mEq/L (ref 135–145)

## 2010-09-04 LAB — MAGNESIUM: Magnesium: 2 mg/dL (ref 1.5–2.5)

## 2010-09-09 NOTE — Progress Notes (Addendum)
  Phone Note Outgoing Call   Details for Reason: Lab work    Patient was seen in the clinic on 09/03/10 for research visit for Analyze ST Study. Pt device was checked for study.Wound check was done with no signs of redness or drainage from ICD site. Pt stated had not felt well. Pt had swelling in lower extremities. Pt had seen cardiologist in [place] and aldactone was increased to 25 mg once daily. Aspirin was increased to 325 mg once daily. Pt had Torsemide added to 40 mg once daily by primary physician . I spoke to Dr. Graciela Husbands about pt. We drew BMP and Magnesium level. Aldactone prescription was renewed. Pt was set up to see Dr. Gala Romney with edema and see Dr. Graciela Husbands in 2 months post hospital. I called pt with lab results. I spoke with wife in [place]. I was unable to reach pt at 2 differnet numbers.

## 2010-09-12 DIAGNOSIS — I255 Ischemic cardiomyopathy: Secondary | ICD-10-CM | POA: Insufficient documentation

## 2010-09-15 ENCOUNTER — Encounter (INDEPENDENT_AMBULATORY_CARE_PROVIDER_SITE_OTHER): Payer: Self-pay

## 2010-09-15 ENCOUNTER — Encounter: Payer: Self-pay | Admitting: Internal Medicine

## 2010-09-15 ENCOUNTER — Encounter (INDEPENDENT_AMBULATORY_CARE_PROVIDER_SITE_OTHER): Payer: BC Managed Care – PPO | Admitting: Internal Medicine

## 2010-09-15 DIAGNOSIS — I5022 Chronic systolic (congestive) heart failure: Secondary | ICD-10-CM

## 2010-09-15 DIAGNOSIS — I2510 Atherosclerotic heart disease of native coronary artery without angina pectoris: Secondary | ICD-10-CM

## 2010-09-15 DIAGNOSIS — R0989 Other specified symptoms and signs involving the circulatory and respiratory systems: Secondary | ICD-10-CM

## 2010-09-15 DIAGNOSIS — I5042 Chronic combined systolic (congestive) and diastolic (congestive) heart failure: Secondary | ICD-10-CM | POA: Insufficient documentation

## 2010-09-18 NOTE — Cardiovascular Report (Signed)
Summary: Office Visit   Office Visit   Imported By: Sallee Provencal 09/11/2010 16:16:19  _____________________________________________________________________  External Attachment:    Type:   Image     Comment:   External Document

## 2010-09-23 NOTE — Procedures (Signed)
Summary: wound check/sjm/amber    ICD Specifications Following MD:  Virl Axe, MD     ICD Vendor:  [employer]     ICD Model Number:  [id number]     ICD Serial Number:  [id number] ICD DOI:  08/13/2010     ICD Implanting MD:  Virl Axe, MD  Lead 1:    Location: [facility]     DOI: 08/13/2010     Model #: [id number]     Serial #: [id number]     Status: active Lead 2:    Location: [facility]     DOI: 08/13/2010     Model #: [id number]     Serial #: [id number]     Status: active  Indications::  ICM  Prescriptions: ALDACTONE 25 MG TABS (SPIRONOLACTONE) 1 once daily  #30 x 6   Entered by:   Devra Dopp, LPN   Authorized by:   Nikki Dom, MD, [facility]   Signed by:   Devra Dopp, LPN on 37/30/8168   Method used:   Electronically to        [facility] (retail)       [place], [place]  [id number]       Ph: [phone]       Fax: [phone]   RxID:   [id number]

## 2010-09-23 NOTE — Assessment & Plan Note (Signed)
Summary: [facility]   Visit Type:  Post-hospital  CC:  chest pains.  History of Present Illness: [name] is a very pleasant 50 year old man with a history of tobacco use,  obesity, OSA, depression, chronic back pain on high-dose narcotics and severe coronary artery disease complicated by an ischemic cardiomyopathy with an EF in a 25% range.  He was admitted in Feburary 2011 with chest pain.   Cath showed:  LM: ostial 20% LAD: Diffuse  40% prox, mid 60-70% focal lesion.  D1: 30% tubular lesion. LCX: gave off a ramus branch, small OM-1.  The distal AV groove circ was subtotally occluded which was chronic.  In the ramus branch, there was evidence of a previously placed stent.  This was now totally occluded.  In the OM-1, there was a 40% proximal lesion. RCA:  dominant vessel, had diffuse 40-50% disease throughout.  In the distal RCA, prior to the PDA, there was a tubular 40% lesion.  In the distal RCA just after the takeoff of the PDA, there was a 99% lesion with a near subtotal occlusion.  In the PDA, there was a 50% tubular lesion distally.  LV 20%.  Failed PCI of distal RCA.  Underwent ICD implantation. Now enrolled in Analyze ST.   Returns for follow-up. Feels fatigued. A couple weeks ago had an episode of CP that lasted a few minutes and resolved. No CP since. Under a lot of stress with alcoholic wife and taking care of 61-year old son. Weight very labile.  Previously on torsemide regulalry and now just takes as needed. Took it twice last week. Denies dyspnea. Walking limited due to back pain/numbness. Scheduled to go to cardiac rehab. Smoking a few cigs every other day.      Current Medications (verified): 1)  Aldactone 25 Mg Tabs (Spironolactone) .Marland Kitchen.. 1 Once Daily 2)  Aspirin 81 Mg Tbec (Aspirin) .... Take One Tablet By Mouth Daily 3)  Crestor 40 Mg Tabs (Rosuvastatin Calcium) .... Take One Tablet By Mouth Daily. 4)  Carvedilol 6.25 Mg Tabs (Carvedilol) .... Take One Tablet By Mouth Twice A  Day 5)  Dilaudid 8 Mg Tabs (Hydromorphone Hcl) .... Every 6 Hours 6)  Imdur 30 Mg Xr24h-Tab (Isosorbide Mononitrate) .... Take 1 Tablet By Mouth Once A Day 7)  Lactulose  Soln (Lactulose) .... As Needed 8)  Lisinopril 5 Mg Tabs (Lisinopril) .... Take One Tablet By Mouth Daily. Ran Out Yesterday 9)  Morphine Sulfate 60 Mg Xr24h-Cap (Morphine Sulfate) .... Every 8 Hours 10)  Nitrostat 0.4 Mg Subl (Nitroglycerin) .Marland Kitchen.. 1 Tablet Under Tongue At Onset of Chest Pain; You May Repeat Every 5 Minutes For Up To 3 Doses. 11)  Plavix 75 Mg Tabs (Clopidogrel Bisulfate) .... Take One Tablet By Mouth Daily 12)  Uroxatral 10 Mg Xr24h-Tab (Alfuzosin Hcl) .... As Needed 13)  Valium 10 Mg Tabs (Diazepam) .... Four Times A Day 14)  Testosterone Injection .... Every 2 Weeks  Allergies (verified): 1)  ! Nubain  Past History:  Past Medical History: Last updated: 09/12/2010 1. Ischemic Cardiomyopathy 2. Ventricular ectopy.  3. Chronic back pain from lumbar stenosis.  4. Obstructive sleep apnea.  5. Benign prostatic hypertrophy.  6. Depression.  7. History of testicular cancer.  8. Chronic narcotic and benzodiazepine dependence.   Review of Systems       As per HPI and past medical history; otherwise all systems negative.   Vital Signs:  Patient profile:   50 year old male Height:  73 inches Weight:      225.50 pounds BMI:     29.86 Pulse rate:   63 / minute Pulse rhythm:   regular Resp:     18 per minute BP sitting:   84 / 56  (right arm) Cuff size:   large  Vitals Entered By: Sidney Ace (September 15, 2010 2:20 PM)  Physical Exam  General:  Well appearing. no resp difficulty HEENT: normal Neck: supple. no JVD. Carotids 2+ bilat; no bruits. No lymphadenopathy or thryomegaly appreciated. Cor: PMI nondisplaced. Regular rate & rhythm. No rubs, gallops, murmur. ICD site well healed Lungs: clear Abdomen: soft, nontender, nondistended. No hepatosplenomegaly. No bruits or masses. Good bowel  sounds. Extremities: no cyanosis, clubbing, rash, edema Neuro: alert & orientedx3, cranial nerves grossly intact. moves all 4 extremities w/o difficulty. 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 NYHA II-III. BP running low. Will split lisinopril to 2.5 two times a day. Use torsemide as needed to keep wt under 230. Unable to do CPX due to back pain.   Problem # 2:  CAD, NATIVE VESSEL (ICD-414.01) Stable. No evidence of ischemia. Continue current regimen. Stressed need for cardiac rehab.   Orders: EKG w/ Interpretation (93000)  Problem # 3:  TOBACCO USE Stressed need for smoking cessation.   Patient Instructions: 1)  Lisinopril 2.24m two times a day  2)  Follow up in 4 weeks--Wed. 4/2 at 12:00 Prescriptions: LISINOPRIL 5 MG TABS (LISINOPRIL) 1/2 tab two times a day  #30 x 6   Entered by:   HKevan Rosebush RN   Authorized by:   DJolaine Artist MD, [facility]  Signed by:   HKevan Rosebush RN on 09/15/2010   Method used:   Electronically to        [facility]       [address] [place] [id number]      Ph: [phone]      Fax: [phone]  RxID:   [id number]

## 2010-10-03 ENCOUNTER — Encounter: Payer: Self-pay | Admitting: *Deleted

## 2010-10-14 ENCOUNTER — Encounter: Payer: Self-pay | Admitting: Internal Medicine

## 2010-10-15 ENCOUNTER — Ambulatory Visit: Payer: BC Managed Care – PPO | Admitting: Internal Medicine

## 2010-10-15 ENCOUNTER — Telehealth: Payer: Self-pay | Admitting: Cardiology

## 2010-10-15 ENCOUNTER — Emergency Department (HOSPITAL_COMMUNITY): Payer: BC Managed Care – PPO

## 2010-10-15 ENCOUNTER — Inpatient Hospital Stay (HOSPITAL_COMMUNITY)
Admission: EM | Admit: 2010-10-15 | Discharge: 2010-10-17 | DRG: 533 | Disposition: A | Discharge status: Home / Self Care | Payer: BC Managed Care – PPO | Attending: Family Medicine | Admitting: Family Medicine

## 2010-10-15 DIAGNOSIS — G4733 Obstructive sleep apnea (adult) (pediatric): Secondary | ICD-10-CM | POA: Diagnosis present

## 2010-10-15 DIAGNOSIS — N138 Other obstructive and reflux uropathy: Secondary | ICD-10-CM

## 2010-10-15 DIAGNOSIS — Z79899 Other long term (current) drug therapy: Secondary | ICD-10-CM

## 2010-10-15 DIAGNOSIS — F1120 Opioid dependence, uncomplicated: Secondary | ICD-10-CM | POA: Diagnosis present

## 2010-10-15 DIAGNOSIS — Z95810 Presence of automatic (implantable) cardiac defibrillator: Secondary | ICD-10-CM

## 2010-10-15 DIAGNOSIS — I2510 Atherosclerotic heart disease of native coronary artery without angina pectoris: Secondary | ICD-10-CM | POA: Diagnosis present

## 2010-10-15 DIAGNOSIS — I2589 Other forms of chronic ischemic heart disease: Secondary | ICD-10-CM | POA: Diagnosis present

## 2010-10-15 DIAGNOSIS — R4701 Aphasia: Principal | ICD-10-CM | POA: Diagnosis present

## 2010-10-15 DIAGNOSIS — F17200 Nicotine dependence, unspecified, uncomplicated: Secondary | ICD-10-CM | POA: Diagnosis present

## 2010-10-15 DIAGNOSIS — F341 Dysthymic disorder: Secondary | ICD-10-CM | POA: Diagnosis present

## 2010-10-15 DIAGNOSIS — I509 Heart failure, unspecified: Secondary | ICD-10-CM | POA: Diagnosis present

## 2010-10-15 DIAGNOSIS — F1920 Other psychoactive substance dependence, uncomplicated: Secondary | ICD-10-CM | POA: Diagnosis present

## 2010-10-15 DIAGNOSIS — I421 Obstructive hypertrophic cardiomyopathy: Secondary | ICD-10-CM

## 2010-10-15 DIAGNOSIS — Z7982 Long term (current) use of aspirin: Secondary | ICD-10-CM

## 2010-10-15 DIAGNOSIS — Z8547 Personal history of malignant neoplasm of testis: Secondary | ICD-10-CM

## 2010-10-15 DIAGNOSIS — N401 Enlarged prostate with lower urinary tract symptoms: Secondary | ICD-10-CM

## 2010-10-15 DIAGNOSIS — Z7902 Long term (current) use of antithrombotics/antiplatelets: Secondary | ICD-10-CM

## 2010-10-15 DIAGNOSIS — I5022 Chronic systolic (congestive) heart failure: Secondary | ICD-10-CM | POA: Diagnosis present

## 2010-10-15 DIAGNOSIS — Z8673 Personal history of transient ischemic attack (TIA), and cerebral infarction without residual deficits: Secondary | ICD-10-CM

## 2010-10-15 DIAGNOSIS — G8929 Other chronic pain: Secondary | ICD-10-CM | POA: Diagnosis present

## 2010-10-15 DIAGNOSIS — N400 Enlarged prostate without lower urinary tract symptoms: Secondary | ICD-10-CM | POA: Diagnosis present

## 2010-10-15 LAB — URINALYSIS, ROUTINE W REFLEX MICROSCOPIC
Bilirubin Urine: NEGATIVE
Glucose, UA: NEGATIVE mg/dL
Ketones, ur: NEGATIVE mg/dL
Leukocytes, UA: NEGATIVE
Nitrite: NEGATIVE
Protein, ur: NEGATIVE mg/dL
Specific Gravity, Urine: 1.026 (ref 1.005–1.030)
Urobilinogen, UA: 0.2 mg/dL (ref 0.0–1.0)
pH: 5.5 (ref 5.0–8.0)

## 2010-10-15 LAB — COMPREHENSIVE METABOLIC PANEL
ALT: 32 U/L (ref 0–53)
AST: 20 U/L (ref 0–37)
Albumin: 4.2 g/dL (ref 3.5–5.2)
Alkaline Phosphatase: 86 U/L (ref 39–117)
BUN: 16 mg/dL (ref 6–23)
CO2: 23 mEq/L (ref 19–32)
Calcium: 9 mg/dL (ref 8.4–10.5)
Chloride: 102 mEq/L (ref 96–112)
Creatinine, Ser: 0.92 mg/dL (ref 0.4–1.5)
GFR calc Af Amer: >60 mL/min (ref >60)
GFR calc non Af Amer: >60 mL/min (ref >60)
Glucose, Bld: 124 mg/dL — ABNORMAL HIGH (ref 70–99)
Potassium: 4.2 mEq/L (ref 3.5–5.1)
Sodium: 133 mEq/L — ABNORMAL LOW (ref 135–145)
Total Bilirubin: 0.6 mg/dL (ref 0.3–1.2)
Total Protein: 7.6 g/dL (ref 6.0–8.3)

## 2010-10-15 LAB — RAPID URINE DRUG SCREEN, HOSP PERFORMED
Amphetamines: NOT DETECTED
Barbiturates: NOT DETECTED
Benzodiazepines: POSITIVE — AB
Cocaine: NOT DETECTED
Opiates: POSITIVE — AB
Tetrahydrocannabinol: POSITIVE — AB

## 2010-10-15 LAB — POCT CARDIAC MARKERS
CKMB, poc: 1.6 ng/mL (ref 1.0–8.0)
Myoglobin, poc: 63.3 ng/mL (ref 12–200)
Troponin i, poc: <0.05 ng/mL (ref 0.00–0.09)

## 2010-10-15 LAB — PROTIME-INR
INR: 1.02 (ref 0.00–1.49)
Prothrombin Time: 13.6 seconds (ref 11.6–15.2)

## 2010-10-15 LAB — CBC
HCT: 48.7 % (ref 39.0–52.0)
Hemoglobin: 17.6 g/dL — ABNORMAL HIGH (ref 13.0–17.0)
MCH: 32.2 pg (ref 26.0–34.0)
MCHC: 36.1 g/dL — ABNORMAL HIGH (ref 30.0–36.0)
MCV: 89 fL (ref 78.0–100.0)
Platelets: 165 10*3/uL (ref 150–400)
RBC: 5.47 MIL/uL (ref 4.22–5.81)
RDW: 13.6 % (ref 11.5–15.5)
WBC: 9.4 10*3/uL (ref 4.0–10.5)

## 2010-10-15 LAB — TROPONIN I: Troponin I: 0.01 ng/mL (ref 0.00–0.06)

## 2010-10-15 LAB — APTT: aPTT: 28 seconds (ref 24–37)

## 2010-10-15 LAB — URINE MICROSCOPIC-ADD ON

## 2010-10-15 LAB — CK TOTAL AND CKMB (NOT AT ARMC)
CK, MB: 5 ng/mL — ABNORMAL HIGH (ref 0.3–4.0)
Relative Index: 2.3 (ref 0.0–2.5)
Total CK: 218 U/L (ref 7–232)

## 2010-10-15 LAB — HEMOGLOBIN A1C
Hgb A1c MFr Bld: 6 % — ABNORMAL HIGH (ref <5.7)
Mean Plasma Glucose: 126 mg/dL — ABNORMAL HIGH (ref <117)

## 2010-10-15 NOTE — Telephone Encounter (Signed)
Patient has been experiencing dizziness and has been unable to form sentences for the past three days. He states that his BP has been elevated for the past few days when usually he is hypotensive. His BP has been running around 168/90, 169/105, 156/119. Spoke w/ Dr. Gala Romney and since patient is experiencing these symptoms with his hypertension, we advised him to go to the ER for further evaluation. I have cancelled his appointment with Dr. Gala Romney this morning. I will notify Trish.

## 2010-10-16 ENCOUNTER — Inpatient Hospital Stay (HOSPITAL_COMMUNITY): Payer: BC Managed Care – PPO

## 2010-10-16 ENCOUNTER — Telehealth: Payer: Self-pay | Admitting: Internal Medicine

## 2010-10-16 DIAGNOSIS — I10 Essential (primary) hypertension: Secondary | ICD-10-CM

## 2010-10-16 LAB — LIPID PANEL
Cholesterol: 145 mg/dL (ref 0–200)
HDL: 27 mg/dL — ABNORMAL LOW (ref >39)
LDL Cholesterol: 84 mg/dL (ref 0–99)
Total CHOL/HDL Ratio: 5.4 RATIO
Triglycerides: 171 mg/dL — ABNORMAL HIGH (ref <150)
VLDL: 34 mg/dL (ref 0–40)

## 2010-10-16 LAB — CBC
HCT: 45.5 % (ref 39.0–52.0)
Hemoglobin: 16.2 g/dL (ref 13.0–17.0)
MCH: 32.1 pg (ref 26.0–34.0)
MCHC: 35.6 g/dL (ref 30.0–36.0)
MCV: 90.1 fL (ref 78.0–100.0)
Platelets: 143 10*3/uL — ABNORMAL LOW (ref 150–400)
RBC: 5.05 MIL/uL (ref 4.22–5.81)
RDW: 13.8 % (ref 11.5–15.5)
WBC: 6.4 10*3/uL (ref 4.0–10.5)

## 2010-10-16 LAB — BASIC METABOLIC PANEL
BUN: 18 mg/dL (ref 6–23)
CO2: 26 mEq/L (ref 19–32)
Calcium: 8.8 mg/dL (ref 8.4–10.5)
Chloride: 103 mEq/L (ref 96–112)
Creatinine, Ser: 0.83 mg/dL (ref 0.4–1.5)
GFR calc Af Amer: >60 mL/min (ref >60)
GFR calc non Af Amer: >60 mL/min (ref >60)
Glucose, Bld: 119 mg/dL — ABNORMAL HIGH (ref 70–99)
Potassium: 3.8 mEq/L (ref 3.5–5.1)
Sodium: 136 mEq/L (ref 135–145)

## 2010-10-16 MED ORDER — IOHEXOL 350 MG/ML SOLN
50.0000 mL | Freq: Once | INTRAVENOUS | Status: AC | PRN
  Administered 2010-10-16: 50 mL via INTRAVENOUS

## 2010-10-16 NOTE — Telephone Encounter (Signed)
Pt calling from [facility], had appt yesterday at 12p, dr Mahalia Longest was running late so he was told to go to er that we would call his orders over, he said no one from her called with orders , he got there and no one knew what to do for him, that someone from our office was to go see him and didn't, he said he had a stroke and they finally did a ct which was normal, they wanted to do an mri but couldn't because of his merlin, so they want to do a doppler and send him home, he said he will not leave the hospital until someone from [facility] sees him today! Wants to talk to dr dan's nurse to found out why he was not seen yesterday

## 2010-10-16 NOTE — Telephone Encounter (Signed)
Discussed w/Dr Bensimhon, he didn't know pt was admitted so he didn't know to see him today, have spoken w/Trish, she will have someone go and see him today, pt is aware

## 2010-10-17 ENCOUNTER — Other Ambulatory Visit: Payer: Self-pay | Admitting: Family Medicine

## 2010-10-17 DIAGNOSIS — G459 Transient cerebral ischemic attack, unspecified: Secondary | ICD-10-CM

## 2010-10-17 DIAGNOSIS — F419 Anxiety disorder, unspecified: Secondary | ICD-10-CM

## 2010-10-17 MED ORDER — DIAZEPAM 10 MG PO TABS: 10.0000 mg | ORAL_TABLET | Freq: Four times a day (QID) | ORAL | Status: DC

## 2010-10-17 NOTE — Consult Note (Addendum)
[name], BONAVENTURE                ACCOUNT NO.:  [id number]  MEDICAL RECORD NO.:  [id number]           PATIENT TYPE:  I  LOCATION:  8841                         FACILITY:  [facility]  PHYSICIAN:  Carlena Bjornstad, MD, FACCDATE OF BIRTH:  [birth year]  DATE OF CONSULTATION:  10/16/2010 DATE OF DISCHARGE:                                CONSULTATION   CHIEF COMPLAINT:  Difficulty with word finding and blurry vision.  HISTORY OF PRESENT ILLNESS:  [name] is a 50 year old gentleman with a history of CAD as outlined below, ischemic cardiomyopathy with an EF of 20% status post ICD implantation, and chronic pain on chronic narcotic/benzodiazepines. He was admitted to the hospital with complaints of difficulty with articulation, blurred vision, weakness, and elevated blood pressure.  He apparently has had a workup for these symptoms in the past at [facility] in which he had a negative carotid Doppler study, and states at that time he was taking an excess of his morphine and it was felt to possibly be related to that.   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  On Tuesday, October 14, 2010, he states he word finding difficulty, blurry vision, as well as dizziness. (The dizziness concerned him since he has never had that symptom before with these episodes.) He experienced a reported elevated heart rate in the 130s, and elevated blood pressure 660-630 systolic.  He is unsure how long it lasted, as he went to lay down for it to resolve.  After about an hour of resting, he felt better, but still had some residual blurry vision.  Tuesday evening, he was playing with his dog on the yard and suddenly felt dizzy, could not talk at all, and difficulty walking.  He knows his blood pressure was 173/121.  However, he was concerned about his pet so he helped take it to the vet.  Upon  getting home, he laid down and napped with his son and felt somewhat better, but upon waking his blood pressure was still 160/170.  He has had no chest pain or syncope.  He possibly had some shortness of breath with these episodes.  On Wednesday, yesterday, he was supposed to have an appointment at noon with Dr. Haroldine Laws and Dr. Caryl Comes but called the office prior to arriving secondary to concerns over these symptoms.  That morning when he woke up, he noted that he still could not read the newspaper and his blood pressure was markedly elevated.  He called our office and was told to preceded to the ER where he was admitted by the [facility].  He also took 40 mg of Valium hoping that it would help with the symptoms but it has not. Today, he felt somewhat better, but still is having difficulty with blurry vision intermittently.  Lab work is mostly unremarkable thus far including negative cardiac enzymes with exception of an MB of 5.0. Urine drug screen is positive for benzos, opiates, and THC.  CT of the head is negative thus  far.  MRI is not able to be obtained secondary to ICD.  He reports no ICD discharges.  PAST MEDICAL HISTORY: 1. CAD.     a.     Status post circumflex stent in 2002.     b.     Last catheterization on August 09, 2010, showing diffuse      three-vessel coronary artery disease including chronic to occluded      distal AV groove circumflex, a ramus that was now totally      occluded, and 99% distal RCA near subtotal occlusion.  At that      cath, the failed PCI to the RCA.  Ischemic cardiomyopathy with EF      of 20% by cath on August 09, 2010, status post dual chamber St.      Jude ICD implantation on August 13, 2010. 2. History of avium complex tuberculosis. 3. Obstructive sleep apnea. 4. Lumbar stenosis/chronic pain on chronic narcotics and     benzodiazepine. 5. Remote history of testicular cancer status post orchiectomy. 6. BPH. 7. Status post  sinus surgery. 8. Status post hernia surgery.  INPATIENT MEDICATIONS: 1. Uroxatral 10 mg daily. 2. Aspirin 325 mg daily. 3. Coreg 6.25 mg b.i.d. 4. Plavix 75 mg daily. 5. Lovenox 40 mg. 6. Imdur 30 mg daily. 7. Morphine sulfate 60 mg q.8 h. 8. Crestor 40 mg daily. 9. Aldactone 12.5 mg daily.  OUTPATIENT MEDICATIONS: 1. Lactulose p.r.n. 2. Lisinopril 2.5 mg b.i.d. 3. Valium 10 mg q.i.d. 4. Coreg 6.25 mg b.i.d. 5. Crestor 40 mg daily. 6. Spironolactone 25 mg daily. 7. Imdur 30 mg daily. 8. Dilaudid 8 mg q.3 h. 9. Morphine sulfate ER 60 mg q.8 h. 10.Aspirin 81 mg daily. 11.Plavix 75 mg daily. 12.Uroxatral 10 mg daily.  ALLERGIES:  The patient admits to allergy of DARVOCET.  Per prior chart record, he is also intolerant to DEMEROL, NUBAIN, PENTAZOCINE, NALOXONE, NALTREXONE, BUPRENORPHINE, and STADOL.  SOCIAL HISTORY:  Mr. [name] recently moved back to [place] from [place].  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  He has a 33-year history of ongoing tobacco abuse, but has cut down significantly on smoking since ICD implantation.  He denies any alcohol or drug use.  He has four children and is married.  FAMILY HISTORY:  His mother is living at 8 with no heart disease. Father died at age 20 with possible MI or sudden cardiac death.  REVIEW OF SYSTEMS:  No fevers, chills, sweats.  Positive for occasional shortness of breath.  No chest pain or edema or syncope.  No nausea, vomiting, diarrhea.  No bright red blood per rectum, melena, or hematemesis.  He does have occasional hematuria which he relates to his prior history of testicular cancer and is followed by Urology.  LABORATORY DATA:  WBC 10.4, hemoglobin 16.2, hematocrit 45.5, platelet count 143,000.  Sodium 136, potassium 3.8, chloride 103, CO2 26,  glucose 119, BUN 18, creatinine 0.83.  Cardiac enzymes negative x1 and second set showed a CK of 218, MB of 5.0, troponin 0.01.  Total cholesterol 145, triglycerides 171, HDL 27, LDL 84.  Urine drug screen positive for benzos, opiates, and THC.  UA showed moderate blood.  RADIOLOGY: 1. CT of the head is negative. 2. EKG normal sinus rhythm with a rate of 73 beats per minute.  QTc of     463, borderline intraventricular  conduction delay with a QRS of     106.  PHYSICAL EXAMINATION:  VITAL SIGNS:  Temperature 97.6, pulse 84, respirations 18, blood pressure 120/79, pulse ox 96% on room air. GENERAL:  This is a middle-aged white male in no acute distress, well developed and well nourished. HEENT:  Normocephalic, atraumatic.  Extraocular movements intact.  Clear sclerae.  Nares without discharge. NECK:  Supple without carotid bruit. HEART:  Auscultation of the heart reveals regular rate and rhythm with S1 and S2 without murmurs, rubs, or gallops. LUNGS:  Sounds are coarse bilaterally with occasional rare rhonchi cleared by coughing. ABDOMEN:  Soft, nontender, nondistended with positive bowel sounds. EXTREMITIES:  Warm and dry and without edema.  He has 2+ pedal pulses bilaterally. NEUROLOGIC:  He is alert and oriented x3, responds to questions appropriately with a normal affect.  ASSESSMENT AND PLAN:  The patient was seen and examined by Dr. Ron Parker and myself.  This is a 50 year old gentleman with a history of CAD, ischemic cardiomyopathy status post ICD implantation, obstructive sleep apnea, and chronic pain on chronic narcotics and benzodiazepines who presents with acute on chronic episodes of intermittent dizziness/blurry vision with recent difficulty with word finding.  At this point, it is not entirely clear what the episodes might have been but it seems unlikely that these are cardiac events.  CT of the head is negative.  We will interrogate his ICD to look for evidence of  arrhythmia.  He denies any ICD shocks.  We do agree with proceeding with a neurology consult, but we do agree with the primary service that these episodes may be related to his opiates/benzodiazepine use leading to altered mental status.  We feel it would be very helpful to get Neurology's objective input on the matter.  At this time, no other cardiac workup is recommended.  He can followup as an outpatient with Dr. Haroldine Laws thereafter.  We will also restart his lisinopril at his home dose, which may be the reason why his blood pressures are running higher than he used to here in the hospital.  He was reassured that his blood pressure in the 120 range is safe for him.  His brief episodes of hypertension in the 160s-170s may have been related to anxiety regarding his symptomatology, but he should keep a blood pressure log at home to assess for variation to see for possible adjustment that may be needed to his medications.  Thank you for asking [place] to participate in the care of this patient.     Melina Copa, P.A.C.   ______________________________ Carlena Bjornstad, MD, [facility]    DD/MEDQ  D:  10/16/2010  T:  10/17/2010  Job:  [id number]  cc:   Shaune Pascal. Bensimhon, MD Deboraha Sprang, MD, [facility]  Electronically Signed by Melina Copa  on 10/17/2010 03:39:50 PM Electronically Signed by Dola Argyle MD Sasser on 10/24/2010 02:47:49 PM

## 2010-10-18 NOTE — Consult Note (Signed)
NAMEANTHONEY, SHEPPARD                ACCOUNT NO.:  [id number]  MEDICAL RECORD NO.:  [id number]           PATIENT TYPE:  I  LOCATION:  3002                         FACILITY:  [facility]  PHYSICIAN:  Levie Heritage, MD       DATE OF BIRTH:  06-Apr-1961  DATE OF CONSULTATION:  10/16/2010 DATE OF DISCHARGE:                                CONSULTATION   REASON FOR CONSULTATION:  Transient expressive aphasia and blurred vision, now fully resolved.  HISTORY OF PRESENT ILLNESS:  This is a 50 year old Caucasian male with past medical history of testicular cancer, CAD, ischemic cardiomyopathy with 20-30% EF, tobacco abuse, obstructive sleep apnea, chronic back pain, benign prostatic hypertrophy, depression, ICD placement on August 13, 2010.  The patient states this Monday he was at work when he noted intermittent expressive aphasia associated with headache, lightheadedness, and blurred vision.  He states that the expressive aphasia only lasted for a few minutes, however, this was followed by slowly progressive lightheadedness and then blurred vision which lasted for approximately 3 days.  During this time of blurred vision and lightheadedness, he noted his blood pressures were significantly higher than normal.  He states his normal blood pressure systolically runs between 80-90 and diastolically 26-70, however, over the past 3 days, he noted that his systolic blood pressure has been between 100-130 and his diastolic is between 90-100.  He describes his blurred vision as being complete blurred field vision.  No specific homonymous or quadrantanopia or blurred vision.  Since his blood pressures have reduced in number over the past day, he feels as though his vision has come back and is now at baseline.  He states that he seen physicians at [facility] approximately 2 years ago for similar expressive aphasia issues.  At that time, he states that his MRI was negative and carotid Dopplers  were negative and he believed that his ejection fraction was actually greater than 30%.  At the present time, his ejection fraction on 2-D echo shows to be 20-30%.  There is a concern that the patient could be having TIA- like symptoms, felt due to his ICD placement.  MRI was not obtainable and Neurology was consulted for further evaluation of the patient.  PAST MEDICAL HISTORY: 1. Coronary artery disease. 2. Ischemic cardiomyopathy with an ejection fraction of 23%.  The     patient sees Dr. Sherryl Manges as his cardiologist for his     cardiomyopathy. 3. Tobacco abuse. 4. History of testicular cancer. 5. Obstructive sleep apnea. 6. Chronic back pain due to lumbar stenosis, on chronic narcotics and     benzodiazepine. 7. Benign prostatic hypertrophy. 8. Depression.  PAST SURGICAL HISTORY:  Cardiac cath in 2006 and 2012, ICD placement on August 13, 2010, orchiectomy, hernia repair, and sinus surgery.  ALLERGIES:  DARVOCET which causes throat swelling.  MEDICATIONS:  While in the hospital, the patient has been placed on Uroxatral, aspirin, Coreg, Plavix, lactulose, morphine, Crestor, Aldactone, Tylenol, Valium, and Dilaudid p.r.n.  SOCIAL HISTORY:  The patient lives with his wife in [place].  He recently moved from [place] within the past few weeks and  has not yet established primary care physician and thus has been placed on the Teaching Service while in the hospital.  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  He does smoke greater than 30-pack per year tobacco.  Denies drinking alcohol or other drug use. However, it should be noted that the patient was THC positive upon admission.  FAMILY HISTORY:  Mother is alive at age 80 and father deceased at 2 secondary to cardiac issues.  REVIEW OF SYSTEMS:  Positive for blurred vision, difficulty with expressing self, back pain,  depression, and chronic tobacco use.  PHYSICAL EXAMINATION:  VITAL SIGNS:  Blood pressure ranges between 87- 133 systolically and 48-87 diastolically, pulse 82-91, respirations 17, temperature 98 degrees Fahrenheit. NEUROLOGI:  The patient is alert and oriented x3.  Carries out 2 and 3- step commands without any difficulty.  Pupils are equal, round, reactive to light and accommodating conjugate.  Extraocular movements are intact. Visual fields are grossly intact.  Face is symmetrical.  Tongue is midline.  Uvula is midline.  The patient at the present time is having no dysarthria, no aphasia, or slurred speech.  I did not note a facial droop.  The patient's facial sensation V1-V3 bilaterally is intact. Shoulder shrug and head turn was within normal limits.  Coordination: The patient's finger-to-nose was smooth.  Heel-to-shin was smooth.  Fine motor movements within normal limits.  Gait:  The patient had some difficulty with gait with one person assist but was able to walk.  He felt as though his bilateral legs are weak.  Motor:  The patient showed 5/5 strength throughout.  No tremor, asterixis, or abnormal muscle movements.  His deep tendon reflexes were 2- throughout with downgoing toes bilaterally.  The patient showed no drift in his upper or lower extremities.  Sensation:  The patient has decreased sensation from mid calf to foot bilaterally and describes having paresthesias on a daily basis secondary to lumbar stenosis.  Otherwise, his upper extremities were within normal limits to pinprick, light touch, vibration. PULMONARY:  Clear to auscultation bilaterally. CARDIOVASCULAR:  S1-S2.  Regular rate and rhythm. NECK:  Negative for bruits and supple.  LABORATORY DATA:  UA was negative.  Urine drug screen showed positive for benzos, opiates, and THC.  Sodium is 136, potassium 3.8, chloride 103, CO2 26, BUN 18, creatinine 0.83, and glucose 119.  White blood cell count 6.4, platelets  143, hemoglobin 16.2, and hematocrit 45.5. Triglycerides 171, cholesterol 145, HDL 27, and LDL 84.  HbA1c of 6.0.  IMAGING:  CT of head was negative for any mass, bleed, or intracranial abnormalities.  CT angio of head and neck is pending.  ASSESSMENT: 1. This is a 50 year old male with questionable transient expressive aphasia. 3-     day history of blurred vision.  Given the     patient's ejection fraction of 23%, we must consider the     possibility of a perfusion dependence stenosis causing transient     ischemic attack symptoms.  At this time, we would recommend continuing with     aspirin and Plavix. 2. Obtain a CTA of head and neck to further evaluate the patient's neck     vessels for intracranial stenosis.   I have discussed these findings with Dr. Hoy Morn.  He has seen and evaluated the patient and agrees with the above recommendations.     Felicie Morn, PA-C  I have discussed the  CTA findings with patient. I have explained him how his symptoms could be result of his HTN during those days. He seems to understand it. I have no additional neurological intervention in this setting to offer. Please call back if  any Qs. ______________________________ Levie Heritage, MD    DS/MEDQ  D:  10/16/2010  T:  10/17/2010  Job:  [id number]  Electronically Signed by Felicie Morn PA-C on 10/17/2010 10:39:49 AM Electronically Signed by Levie Heritage MD on 10/18/2010 08:02:17 AM

## 2010-10-22 NOTE — Telephone Encounter (Signed)
Spent 15 min on phone w/pt he is requesting that Dr Gala Romney discuss his condition w/his brother [name] and explain to him that "he is not a surgical candidate and only options would be a heart pump and transplant" will discuss w/Dr Bensimhon tom and call him back

## 2010-10-22 NOTE — Telephone Encounter (Signed)
i would request that his brother come to next office visit if he would like to discuss.
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
## 2010-10-27 ENCOUNTER — Ambulatory Visit (INDEPENDENT_AMBULATORY_CARE_PROVIDER_SITE_OTHER): Payer: BC Managed Care – PPO | Admitting: Psychology

## 2010-10-27 DIAGNOSIS — F4323 Adjustment disorder with mixed anxiety and depressed mood: Secondary | ICD-10-CM

## 2010-10-27 NOTE — Discharge Summary (Signed)
NAMEHERVE, HAUG                ACCOUNT NO.:  [id number]  MEDICAL RECORD NO.:  [id number]           PATIENT TYPE:  I  LOCATION:  7106                         FACILITY:  [facility]  PHYSICIAN:  Blane Ohara Tyrome Donatelli, M.D.DATE OF BIRTH:  04-15-1961  DATE OF ADMISSION:  10/15/2010 DATE OF DISCHARGE:  10/17/2010                              DISCHARGE SUMMARY   PRIMARY CARE PROVIDER:  [facility].  CARDIOLOGIST:  [facility].  DISCHARGE DIAGNOSES: 1. Transient neurologic dysfuntion with expressive dysphasia 2. Coronary artery disease. 3. Ischemic cardiomyopathy, new ejection fraction of 30-45%. 4. Tobacco abuse. 5. History of testicular cancer. 6. Chronic back pain due to lumbar stenosis. 7. Chronic necrotic and benzodiazepines dependence. 8. Benign prostatic hypertrophy. 9. Depression. 10. Anxiety disorder.  DISCHARGE MEDICATIONS:  Medications which were changed to include spironolactone decreased from 25 mg to 12.5 mg p.o. daily.  Home medications which remained the same includes: 1. Aspirin 81 mg p.o. b.i.d. 2. Carvedilol 6.25 mg p.o. b.i.d. with meals. 3. Crestor 40 mg p.o. daily. 4. Hydromorphone 8 mg p.o. q.3 hours. 5. Isosorbide mononitrate XR 30 mg p.o. daily. 6. Lactulose 2 teaspoons p.o. b.i.d. every 2-3 days p.r.n.     constipation. 7. Lisinopril 2.5 mg p.o. b.i.d. 8. Morphine sulfate CR 60 mg p.o. q.8 hours. 9. Nitroglycerin translingual spray, 1 spray sublingual q.5 minutes up     to 3 times p.r.n. chest pain. 10.Plavix 75 mg p.o. daily. 11.Uroxatral 10 mg p.o. daily p.r.n. urinary retention. 12.Valium 10 mg p.o. 4 times a day.  CONSULTS: [facility] 2. Neurology.  PROCEDURES: 1. CT of the head on October 15, 2010, without contrast which was     negative. 2. CT angiogram of the head and neck on October 17, 2010, showing a     negative neck CTA. 3. Normal CTA of the brain for age and minor ICA - atherosclerosis, no     intracranial  stenosis and otherwise negative intracranial CTA. 4. Echocardiogram on October 17, 2010, showing a moderately reduced     systolic function, estimated ejection fraction in the range of 35-     40%. 5. Carotid Dopplers on October 17, 2010, which were negative.  LABORATORY DATA:  On admission, the patient's CBC and BMP were within normal limits.  The patient's troponin was negative at 0.05.  Point of care, repeat cardiac enzymes were negative with troponin of 0.01. Hemoglobin A1c 6.0.  Fasting lipid panel; cholesterol 145, HDL 27, LDL 84.  Urine drug screen on admission was positive for benzodiazepines, opiates, and THC.  Urinalysis was negative.  BRIEF HOSPITAL COURSE:  This is a 50 year old male with an extensive cardiac history, presenting with expressive aphasia and elevated blood pressures, concerning for possible TIA.  1. Expressive aphasia.  Neurology and Cardiology were consulted.  CT     of the head was negative.  TIA rule out workup was negative with     negative carotid Dopplers, echocardiogram, and CTA head and neck.     The patient was unable to obtain an MRI, MRA due to his ICD     placement.  However, Neurology felt that the CTA of head and neck     was adequate for ruling out any intracranial stenosis.  Throughout     his hospitalization, the patient felt that his expressive aphasia     and boggy headedness as well as blurred vision were slowly     improving on the day of discharge.  He felt that he was back to his     baseline.  The patient never experienced any of these acute     expressive aphasia events while in the hospital.  Neurology did not     feel that he needs any further neurologic workup and does not need     to be seen on an outpatient basis.  Neurological exam remained     completely benign from admission to discharge. 2. Cardiovascular.  The patient was continued on his home medications     and was seen by [facility] for elevated blood pressures      from the patient's normal baseline.  Per the patient, his normal     blood pressures were 80s-90s/40s-50s, however while in-house, he     was in the 100s-120s/60s-80s.  The patient was extremely concerned     that this could cause him to have a stroke, however, consulted     [facility] and they were not concerned with the patient's     current blood pressures.  In addition, the patient's echo showed an     improved ejection fraction from 20-30% to 35-40% which could also     contribute to the patient's normalized blood pressures.  ICD was     interrogated while here and no concerns were found.  The patient is     to follow up with Dr. Haroldine Laws as an outpatient. 3. Chronic pain/lumbar stenosis.  The patient was continued on his     home pain regimen with Dilaudid and extended release morphine.  The     patient did not have any increase pain and there is no need for any     acute medication changes while in-house. 4. Anxiety.  The patient was continued on his home 4 times daily     Valium.  DISCHARGE INSTRUCTIONS:  The patient was discharged home with instructions to return for any acute neurological changes including numbness, tingling, weakness, slurred speech, or visual changes.  FOLLOWUP APPOINTMENTS:  The patient plans to follow up with [facility].  He will call to set up new patient appointment.  In addition, the patient will follow up with [facility] per Cardiology, their office will call him to schedule an appointment.  DISCHARGE CONDITION:  The patient was discharged home in stable medical condition with resolution of all questionable TIA symptoms.    ______________________________ Lorin Glass, MD   ______________________________ Blane Ohara Ayala Ribble, M.D.    JM/MEDQ  D:  10/17/2010  T:  10/18/2010  Job:  [id number]  cc:   [facility]  Electronically Signed by Lorin Glass MD on 10/20/2010  10:29:11 PM Electronically Signed by Lissa Morales M.D. on 10/27/2010 09:36:56 AM

## 2010-10-28 NOTE — Telephone Encounter (Signed)
Dr Gala Romney has spoken w/Dr Dellia Cloud, he states he is going to call pt's brother [name] to discuss

## 2010-11-04 ENCOUNTER — Ambulatory Visit (INDEPENDENT_AMBULATORY_CARE_PROVIDER_SITE_OTHER): Payer: BC Managed Care – PPO | Admitting: Psychology

## 2010-11-04 DIAGNOSIS — F4323 Adjustment disorder with mixed anxiety and depressed mood: Secondary | ICD-10-CM

## 2010-11-21 ENCOUNTER — Ambulatory Visit: Payer: BC Managed Care – PPO | Admitting: Psychology

## 2010-11-26 ENCOUNTER — Ambulatory Visit (INDEPENDENT_AMBULATORY_CARE_PROVIDER_SITE_OTHER): Payer: BC Managed Care – PPO | Admitting: Internal Medicine

## 2010-11-26 ENCOUNTER — Encounter: Payer: Self-pay | Admitting: Internal Medicine

## 2010-11-26 VITALS — BP 130/80 | HR 74 | Resp 14 | Ht 73.0 in | Wt 240.0 lb

## 2010-11-26 DIAGNOSIS — I2510 Atherosclerotic heart disease of native coronary artery without angina pectoris: Secondary | ICD-10-CM

## 2010-11-26 DIAGNOSIS — I5042 Chronic combined systolic (congestive) and diastolic (congestive) heart failure: Secondary | ICD-10-CM

## 2010-11-26 MED ORDER — ALFUZOSIN HCL ER 10 MG PO TB24: 10.0000 mg | ORAL_TABLET | Freq: Every day | ORAL | Status: DC

## 2010-11-26 MED ORDER — CARVEDILOL 6.25 MG PO TABS: ORAL_TABLET | ORAL | Status: DC

## 2010-11-26 MED ORDER — LISINOPRIL 10 MG PO TABS: ORAL_TABLET | ORAL | Status: DC

## 2010-11-26 MED ORDER — LISINOPRIL 5 MG PO TABS: ORAL_TABLET | ORAL | Status: DC

## 2010-11-26 NOTE — Assessment & Plan Note (Signed)
No evidence of ischemia. Continue current regimen.

## 2010-11-26 NOTE — Assessment & Plan Note (Addendum)
Doing fairly well. NYHA Class II. Extensive discussion about prognosis and we went through [facility] which predicted 5 year survival of 96-99% which was reassuring to him. Will focus on aggressive medical therapy with titration of ace-I and b-blocker.  I suspect he will have al least moderate improvement in his LV function with medical therapy. Increase coreg to 9.375 bid and lisinopril 10 qd. Return in 2 weeks. I suspect with medical thearpy and a daily exercise program he will be able to withstand back surgery in the near future.

## 2010-11-26 NOTE — Patient Instructions (Signed)
Increase Carvedilol to 1 & 1/2 tabs Twice daily  Increase Lisinopril to 10mg  daily Your physician recommends that you schedule a follow-up appointment in: 2 weeks

## 2010-11-26 NOTE — Progress Notes (Signed)
HPI:  [name] is a very pleasant 50 year old man with a history of tobacco use,  obesity, OSA, depression, chronic back pain on high-dose narcotics and severe coronary artery disease complicated by an ischemic cardiomyopathy with an EF in a 25% range.  Cath showed (2/11):  LM: ostial 20% LAD: Diffuse  40% prox, mid 60-70% focal lesion.  D1: 30% tubular lesion. LCX: gave off a ramus branch, small OM-1.  The distal AV groove circ was subtotally occluded which was chronic.  In the ramus branch, there was evidence of a previously placed stent.  This was now totally occluded.  In the OM-1, there was a 40% proximal lesion. RCA:  dominant vessel, had diffuse 40-50% disease throughout.  In the distal RCA, prior to the PDA, there was a tubular 40% lesion.  In the distal RCA just after the takeoff of the PDA, there was a 99% lesion with a near subtotal occlusion.  In the PDA, there was a 50% tubular lesion distally.  LV 20%.  Failed PCI of distal RCA.  Underwent ICD implantation. Now enrolled in [employer].   Was admitted in 4/12 with possible TIA symptoms but CT and CTA was normal felt it might be related to pain medications.   Returns for follow-up. Feels fatigued. Continues to have problems with legs going numb. Weight up 15-18 pounds. Just got an exercise bike. Used it once or twice without too much problem Able to ride 4-5 miles. Denies significant edema. No orthopnea or PND. Says he doesn't sleep well because he was worried about dying.  Previously on torsemide regulalry and now just takes as needed. Also on spiro. Walking limited due to back pain/numbness which is progressive. Scheduled to go to cardiac rehab. Smoking a few cigs every other day. Recently decreased morphine and BP now improved.     ROS: All systems negative except as listed in HPI, PMH and Problem List.   Past Medical History  Diagnosis Date  . Chronic systolic heart failure     EF 20-25%. s/p ST. Jude ICD  . CAD (coronary artery  disease)     Last cath 2/12. 3-v CAD. Failed PCI of distal RCA  . Chronic back pain     lumbar stenosis  . Benign prostatic hypertrophy   . Depression   . History of testicular cancer   . Narcotic dependence     chronic  . Benzodiazepine dependence     chronic    Current Outpatient Prescriptions  Medication Sig Dispense Refill  . alfuzosin (UROXATRAL) 10 MG 24 hr tablet Take 10 mg by mouth as needed.        Marland Kitchen aspirin 81 MG tablet Take 81 mg by mouth daily.        . carvedilol (COREG) 6.25 MG tablet Take 6.25 mg by mouth 2 (two) times daily with a meal.        . clopidogrel (PLAVIX) 75 MG tablet Take 75 mg by mouth daily.        . diazepam (VALIUM) 10 MG tablet Take 1 tablet (10 mg total) by mouth 4 (four) times daily.  124 tablet  1  . HYDROmorphone (DILAUDID) 8 MG tablet Take 8 mg by mouth every 6 (six) hours as needed.        . isosorbide mononitrate (IMDUR) 30 MG 24 hr tablet Take 30 mg by mouth daily.        Marland Kitchen lactulose (CHRONULAC) 10 GM/15ML solution Take 20 g by mouth as needed.        Marland Kitchen  lisinopril (PRINIVIL,ZESTRIL) 5 MG tablet 1 tab po qd      . morphine (KADIAN) 60 MG 24 hr capsule Take 60 mg by mouth every 8 (eight) hours.        . nitroGLYCERIN (NITROSTAT) 0.4 MG SL tablet Place 0.4 mg under the tongue every 5 (five) minutes as needed.        . rosuvastatin (CRESTOR) 40 MG tablet Take 40 mg by mouth daily.        Marland Kitchen spironolactone (ALDACTONE) 25 MG tablet Take 25 mg by mouth daily.        Marland Kitchen testosterone cypionate (DEPOTESTOTERONE CYPIONATE) 200 MG/ML injection Inject into the muscle every 14 (fourteen) days.        Marland Kitchen DISCONTD: isosorbide mononitrate (IMDUR) 30 MG 24 hr tablet Take 30 mg by mouth daily.           PHYSICAL EXAM: Filed Vitals:   11/26/10 0918  BP: 130/80  Pulse: 74  Resp: 14   General:  Well appearing. No resp difficulty HEENT: normal Neck: supple. JVP flat. Carotids 2+ bilaterally; no bruits. No lymphadenopathy or thryomegaly appreciated. Cor: PMI  normal. Regular rate & rhythm. Soft SEM at apex. No s3 Lungs: clear Abdomen: soft, nontender, nondistended. No hepatosplenomegaly. No bruits or masses. Good bowel sounds. Extremities: no cyanosis, clubbing, rash, edema Neuro: alert & orientedx3, cranial nerves grossly intact. Moves all 4 extremities w/o difficulty. Affect pleasant.    ECG:   ASSESSMENT & PLAN:

## 2010-11-28 NOTE — Cardiovascular Report (Signed)
NAME:  [name], [name]                          ACCOUNT NO.:  [id number]   MEDICAL RECORD NO.:  [id number]                   PATIENT TYPE:  OIB   LOCATION:  2870                                 FACILITY:  [facility]   PHYSICIAN:  Ludwig Lean. Doreatha Lew, M.D.            DATE OF BIRTH:  [birth year]   DATE OF PROCEDURE:  12/08/2002  DATE OF DISCHARGE:                              CARDIAC CATHETERIZATION   PROCEDURE:  Left heart catheterization with selective coronary angiography,  left ventriculography with Perclose.   HISTORY:  [name] had a previous stent to the intermediate coronary  artery.  He is referred now for followup catheterization because of a  history of intermittent chest pain.   Percutaneous right femoral artery.   CATHETERS:  6-French __________ curved,  Judkins right and left coronary  catheters, 6-French pigtail ventriculographic catheter.   CONTRAST MATERIAL:  Omnipaque.   MEDICATIONS:  Prior to the procedure, Valium 10 mg p.o.  During the  procedure, Versed 6 mg IV, Ancef 1 g IV.   COMMENTS:  The patient tolerated the procedure well.   HEMODYNAMIC DATA:  The aortic pressure was 120/74, LV 120/14-23.  There is  no aortic valve gradient on pull-back.   ANGIOGRAPHIC DATA:   LEFT VENTRICULOGRAM:  The left ventriculogram was performed in the RAO  position.  Overall cardiac size and silhouette are normal.  The global  ejection fraction is 60%.  Regional wall motion is normal.   CORONARY ARTERIES:  1. Right coronary artery:  The right coronary artery is very large, dominant     vessel.  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  The vessel itself is at least a 3.5 mm vessel.  It supplies     extensive collaterals to the distal left circumflex by way of  large     posterolateral branches.  The right coronary artery extends up into the     posterior AV groove.  There is no obstructive coronary disease in the     right coronary artery.  It  functionally is only the aneurysmal     atherosclerotic process.  2. Left main coronary artery is normal.  3. Intermediate coronary:  The intermediate coronary is widely patent.  The     stent is present and is widely patent without restenosis.  4. Left circumflex:  The left circumflex is a relatively small vessel.     There is one high obtuse marginal and then a continuation of the left     circumflex in the AV groove.  This is a small vessel and it would appear     that there is at least 20 mm of vessel occlusion before retrograde     collaterals begin to appear.  This vessel would be probably too small to     receive a successful stent.  5. Left anterior descending:  The  left anterior descending is a reasonably     large vessel that wraps around the apex.  There are irregularities in     this vessel but no significant obstructive disease.    OVERALL IMPRESSION:  1. Normal left ventricular function.  2. Patent stent in the intermediate coronary artery.  3. Totally occluded small branch of the left circumflex with retrograde fill     via the left coronary artery as well as the right coronary artery.  4. 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   DISCUSSION:  In light of these findings, it was felt that [name] is best  treated medically with risk factor modification.                                               Ludwig Lean. Doreatha Lew, M.D.    SNT/MEDQ  D:  12/08/2002  T:  12/10/2002  Job:  [id number]   cc:   Youlanda Roys. Deatra Ina, M.D.  [address]  [place]  [place] [address]  Fax: [phone]

## 2010-11-28 NOTE — H&P (Signed)
[name], [name]                ACCOUNT NO.:  [id number]   MEDICAL RECORD NO.:  [id number]          PATIENT TYPE:  INP   LOCATION:  0102                         FACILITY:  [facility]   PHYSICIAN:  Burnett Harry. Joya Gaskins, MD, FCCPDATE OF BIRTH:  06-15-1961   DATE OF ADMISSION:  09/17/2006  DATE OF DISCHARGE:                              HISTORY & PHYSICAL   CHIEF COMPLAINT:  Hemoptysis with fever.   HISTORY OF PRESENT ILLNESS:  This is a 50 year old white male whom I  have seen previously for obstructive sleep apnea in 2004, but I have not  seen since 2005.  The patient noted the onset for 3 days of fever up to  102 degrees, hemoptysis, rusty-colored sputum, shortness of breath,  congestion.  He had Augmentin around the house, which he took five doses  of 875 mg.  He then passed out some this morning and then awakened still  quite weak with right sided, sharp, stabbing chest pain.  He smokes 2-3  cigarettes daily.  He has noted increased acid heartburn.  He has lost  some weight.  He had been on a CPAP machine but has not been using it.  He comes to the office as a work-in and on x-ray has bilateral  infiltrates, and low blood pressure, and low oxygen saturations.  On  this basis, he is admitted for further inpatient care.   PAST HISTORY:   MEDICAL HISTORY:  1. Hypertension.  2. History of coronary artery disease with a stent in 2002.  3. History of testicular cancer with resection.  4. History of depression in the past.  5. Obstructive sleep apnea as noted above.  6. The patient also has a history of a chronic pain syndrome with      chronic pain medications.  He has got significant radicular pain      multifactorial with severe stenosis L4-L5, lateral recess stenosis      L5-S1, with disk degeneration in the lumbar spine and chronic leg      pain.   OPERATIVE HISTORY:  1. Angioplasty, twice.  2. Testicular cancer surgery.  3. Hernia surgery, 1987.  4. Septal surgery of the  sinuses, 1983.   CURRENT MEDICATIONS:  1. Plavix 75 mg daily.  2. Cardizem CD 300 mg daily.  3. Wellbutrin 150 mg daily.  4. Flomax 0.4 mg daily.  5. Testim daily.  6. MS Contin 100 mg b.i.d.  7. MSIR 30 mg every 4 hours p.r.n.  8. Valium p.r.n.  9. Nitroglycerin p.r.n.   ALLERGIES:  DARVOCET CAUSES DYSPNEA.   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   FAMILY HISTORY:  Allergies in brother and a daughter.  Asthma in a  daughter.   REVIEW OF SYSTEMS:  He has actually lost weight on purpose over time.  He has had a sore throat, had change in color of mucus, has had  bilateral chest pain, increased shortness of breath, acid symptoms.   FAMILY HISTORY:  I have already said, father  died at age 29.   SOCIAL HISTORY:  He does live with his wife as noted above and four  children.  Smoking history as noted above.   PHYSICAL EXAMINATION:  GENERAL:  This is an ill-appearing, pale male,  diaphoretic, in no acute distress.  VITAL SIGNS:  Weight 240 pounds, temperature 99.2, blood pressure  106/70, pulse 94, saturation 90% room air.  CHEST:  Showed rhonchi bilaterally and consolidated changes, right  greater than left  lower lung zones.  CARDIAC:  Showed resting tachycardia without S3, normal S1 S2.  ABDOMEN:  Soft, nontender.  EXTREMITIES:  Showed no edema, clubbing, or venous disease.  SKIN:  Clear.  NEUROLOGIC:  Intact.  HEENT:  Showed no jugular distension, no lymphadenopathy.  Oropharynx  clear.  NECK:  Supple.   LABORATORY DATA:  Chest x-ray showed bilateral lower lobe infiltrates,  right greater than left.  No other labs are available for review.   IMPRESSION:  1. Bilateral lower lobe community-acquired pneumonia with rusty-      colored sputum, rule out Streptococcal pneumoniae.  2. Mild early sepsis.   RECOMMENDATIONS:  1. Admit to ICU.  2. Give IV fluid bolus.  3. Give  Rocephin and Zithromax IV.  4. Obtain blood cultures, sputum culture, routine admission labs.      Burnett Harry Joya Gaskins, MD, [facility]  Electronically Signed     PEW/MEDQ  D:  09/17/2006  T:  09/17/2006  Job:  [id number]   cc:   [name], M.D.

## 2010-11-28 NOTE — Cardiovascular Report (Signed)
[name], [name]                ACCOUNT NO.:  [id number]   MEDICAL RECORD NO.:  [id number]          PATIENT TYPE:  INP   LOCATION:  2901                         FACILITY:  [facility]   PHYSICIAN:  Kaylyn Lim., M.D.DATE OF BIRTH:  Apr 26, 1961   DATE OF PROCEDURE:  05/18/2005  DATE OF DISCHARGE:                              CARDIAC CATHETERIZATION   INDICATIONS FOR PROCEDURE:  Refractory chest pain.   DESCRIPTION OF PROCEDURE:  The patient was brought to the cardiac cath lab  after appropriate informed consent. He is prepped and draped in a sterile  fashion. Approximately 15 cc of 1% lidocaine was used for local anesthesia.  A 6-French sheath was placed in the right femoral artery without difficulty.  Coronary angiography, LV angiography and limited right femoral angiography  were then performed. Arteriotomy site was suitable for Angio-Seal closure.  Angio-Seal closure device was deployed successfully.   FINDINGS:  1.  Left Main: Normal.  2.  LAD: Large vessel with mild to moderate luminal irregularities. No      significant obstructive disease noted.  3.  D1: Moderate size with mild luminal irregularities.  4.  Ramus intermedius: Large vessel was widely patent proximal stent and      mild mid and distal luminal regularities  5.  LCX: Small vessel with mid-occlusion after first OM, mid and distal      vessel fill via R->L collaterals  6.  OM: Moderate size vessel with mild luminal irregularities.  7.  RCA: Dominant with mild to moderate luminal irregularities and two focal      ectatic areas noted in the mid-vessel. RCA does give good right-to-left      collaterals to the distal and mid-circumflex.  8.  LV: EF is 50% with no wall motion abnormalities. LVEDP is 23 mmHg.  9.  Right femoral shows mild luminal irregularities. Arteriotomy site was      suitable for Angio-Seal closure device. AngioSeal closure device was      deployed without difficulty.   IMPRESSION:  1.  Small  single-vessel obstructive disease (unchanged from catheterization      in 2004).  2.  Widely patent proximal ramus intermedius stent.  3.  Normal left ventricular systolic function.   PLAN:  1.  Aggressive risk factor modification and medical management. We will add      Plavix to his ongoing medical regimen.  2.  Total cessation of all illegal drug use and tobacco products was      discussed with patient at length.      Kaylyn Lim., M.D.  Electronically Signed     TWK/MEDQ  D:  05/18/2005  T:  05/18/2005  Job:  [id number]   cc:   Ludwig Lean. Doreatha Lew, M.D.  Fax: [phone]

## 2010-11-28 NOTE — H&P (Signed)
[name], [name] NO.:  [id number]   MEDICAL RECORD NO.:  [id number]          PATIENT TYPE:  EMS   LOCATION:  MAJO                         FACILITY:  [facility]   PHYSICIAN:  Belva Crome III, M.D.DATE OF BIRTH:  08-14-1960   DATE OF ADMISSION:  10/25/2004  DATE OF DISCHARGE:                                HISTORY & PHYSICAL   REASON FOR ADMISSION:  Chest discomfort.   SUBJECTIVE:  [name] is 50 years of age and currently has a difficult time  giving the history. He apparently does have a history of coronary artery  disease having undergone stent implantation in the LAD. Catheterization as  recently as a year ago by Dr. Doreatha Lew did not reveal any significant  obstruction. The patient also previously had stent placed in the first  obtuse marginal.   He states that this morning before it was time to get up, he awakened having  some indigestion and took some Nexium with relief. After arising later this  morning, he began having chest pain. He cannot characterize his discomfort.  He states that prior to his cardiac interventions, he had no pain and cannot  really remember what his symptom was that led to the presentation. He made  it sound as if he collapsed or was brought in but cannot remember any  details. He is currently pain-free but states he has used the sublingual  nitroglycerin spray several times today with relief.   ALLERGIES:  DARVOCET.   PAST MEDICAL HISTORY:  1.  History of sleep apnea.  2.  History of testicular cancer with orchiectomy in 2002, radiation      therapy, and now testosterone replacement therapy.  3.  Hyperlipidemia.  4.  Hypertension.  5.  Old charts indicate narcotic dependency.  6.  History of sleep apnea.  7.  Obesity.   HABITS:  Continues to smoke cigarettes. Denies ethanol use and substance  abuse.   FAMILY HISTORY:  Father died of heart attack at age 84. Mother is alive and  well.   SOCIAL HISTORY:  He does have a law  degree. He is with his fiance, she is  expecting.   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   MEDICATIONS:  1.  Nexium 40 mg per day.  2.  Plavix 75 mg per day.  3.  Demadex 40 mg every other day.  4.  Xanax 2 mg p.r.n.  5.  Cardizem CD 200 mg per day.  6.  Plavix 75 mg per day.  7.  Baby aspirin one per day.  8.  Crestor 20 mg per day.  9.  AndroGel ointment daily.   OBJECTIVE:  VITAL SIGNS:  On exam, the patient is in no acute distress. His  blood pressure is 128/60, heart rate is 70.  SKIN:  Reveals multiple healed pits in both arms that he says are the result  of healed abscesses from methicillin-resistant Staphylococcus.  HEENT:  Reveals pupils are equal and reactive.  CHEST:  Clear.  CARDIAC:  No gallop, no murmur, no rub,  no click.  ABDOMEN:  Soft, bowel sounds are normal. No tenderness.  EXTREMITIES:  No edema. Upper and lower extremity pulses are 2+.  NEUROLOGIC:  Reveals the patient with some slurred speech. His wife or  fiance says this happens from time to time. No obvious focal deficit. He is  having difficulty maintaining his focus during the interview and becomes  somewhat tangential.   His EKG is nonischemic.   ASSESSMENT:  Problem 1. Chest pain, poorly characterized in a patient with  history of coronary artery disease who has had angioplasty on left anterior  descending and stent in the circumflex obtuse marginal, rule out recurrent  angina. Rule out other.   Problem 2. Slurred speech, etiology uncertain. Rule out substance abuse.   Problem 3. History of testicular cancer status post orchiectomy and  radiation.   Problem 4. Hyperlipidemia.   Problem 5. Hypertension.   PLAN:  Admit for observation. Urine drug screen. Serial enzymes and EKGs.  Lovenox subcu.      HWS/MEDQ  D:  10/25/2004  T:  10/25/2004  Job:  [id number]   cc:   Ludwig Lean. Doreatha Lew, M.D.  Fax: [phone]   Youlanda Roys. Deatra Ina,  M.D.  [address]  [place]  [place] [address]  Fax: [phone]

## 2010-11-28 NOTE — Assessment & Plan Note (Signed)
[facility]                             PULMONARY OFFICE NOTE   SERAJ, DUNNAM                       MRN:          [id number]  DATE:09/29/2006                            DOB:          [birth year]    [name] is seen today in post-hospital followup.  He was hospitalized  for community-acquired pneumonia between March 7 and 10.  He also has  chronic pain syndrome with multifactorial pain secondary to severe  stenosis of L1 to L5 and recess stenosis of L5 to S1.  Also a history of  obstructive sleep apnea.  During the hospitalization he was given broad-  spectrum antibiotics.  No specific positive micro-data was obtained.  He  did improve and was discharged home.  He has finished his course of  Avelox and overall is improved.  The problems are that his local  orthopedist has discharged him from the practice because of his question  of multiple medications for narcotics to be used.  He had and  established appointment with Dr. Marvel Plan of the [facility]  for potential surgery, but Dr. Marvel Plan wanted him to come off all  narcotics prior to surgery.  Dr. Marvel Plan was given the notion that  the patient had a prescription for methadone along with his morphine MS-  Contin.  According to my records, the only prescription medicines the  patient has is the MS-Contin of which he is taking 100 mg twice daily,  and MSIR at 30 mg every 4 to 6 hours for breakthrough pain.  He says now  he has run out of these medicines completely, as he has been discharged  from the orthopedic practice.  He has a pending appointment with Dr.  Reece Levy for detoxification and further pain management with [facility].  1. The patient maintains Plavix 75 mg daily.  2. Wellbutrin 150 mg daily.  3. Flomax 0.4 mg daily.  4. Reglan 10 mg AC and at bedtime.   EXAM:  This is an obese male in no acute distress.  Temperature 98, blood pressure 132/80, pulse 101,  saturation 92% on room  air.  Weight is 237 pounds.  CHEST:  Clear without evidence of wheeze, rale, or rhonchi.  CARDIAC:  Regular rate and rhythm without S3.  Normal S1, S2.  ABDOMEN:  Soft, nontender.  EXTREMITIES:  No edema, clubbing, or venous disease.  SKIN:  Clear.  NEUROLOGIC:  Intact.  HEENT:  No jugular venous distension.  No lymphadenopathy.  Oropharynx  clear.  NECK:  Supple.   CHEST X-RAY:  Obtained today showed complete resolution of bilateral  infiltrates.   IMPRESSION:  1. Community-acquired pneumonia with resolution of bilateral pneumonia      on chest x-ray.  For this, no further pulmonary treatment is      indicated.  2. Obstructive sleep apnea.  No further treatment of this is      indicated, other than nocturnal oxygen therapy, which he already      has, until he can undergo further testing and treatment following      back surgery.  3. Chronic pain syndrome.  The patient is completely out of pain      medicines, and he has a pending appointment with Dr. Reece Levy.  I gave      him a 1-week supply of MS-Contin at 100 mg b.i.d. and a 1-week      supply of MSIR 30 mg every 4 to 6 hours p.r.n.  Dispensed a total      number on this 1 of 20 tablets and 14 total of MS-Contin 100s.  No      additional refills will be issued from this office and the patient      understands this.  He was given a number to call for Dr. Reddy's      office, who confirmed that he does have an appointment with their      service once he pays their up-front fee.  The patient is aware of      this.  The patient does not require further pulmonary followup and      we release this patient for further followup.     Burnett Harry Joya Gaskins, MD, [facility]  Electronically Signed    PEW/MedQ  DD: 09/29/2006  DT: 09/30/2006  Job #: [id number]   cc:   Janice Coffin. Nelva Bush, M.D.  Denna Haggard, M.D.

## 2010-11-28 NOTE — Discharge Summary (Signed)
NAMEDARE, SANGER                ACCOUNT NO.:  [id number]   MEDICAL RECORD NO.:  [id number]          PATIENT TYPE:  INP   LOCATION:  2901                         FACILITY:  [facility]   PHYSICIAN:  Kaylyn Lim., M.D.DATE OF BIRTH:  15-Mar-1961   DATE OF ADMISSION:  05/17/2005  DATE OF DISCHARGE:  05/18/2005                                 DISCHARGE SUMMARY   DISCHARGE DIAGNOSES:  1.  History of single vessel obstructive coronary disease.  2.  History of dyslipidemia.  3.  Chronic pain.  4.  Substance abuse.  5.  Sleep apnea.  6.  History of testicular cancer with orchiectomy and radiation therapy in      2002.  7.  Hypertension.  8.  Narcotic dependency.   HISTORY OF PRESENT ILLNESS:  The patient was admitted 05/17/2005 with  diffuse and chronic chest and back pain.   HOSPITAL COURSE:  The patient was admitted to the step-down unit for  evaluation. He was initially placed on nitroglycerin drip without any  significant improvement in his pain. A urine drug screen was performed which  was positive for both cocaine and marijuana as well as narcotics and  benzodiazepines. I discussed this with the patient at length and he reported  that he did have marijuana and cocaine use once within the last week. He was  started on Dilaudid on arrival because of complaints of 10/10 in diffuse  pain all over. This was quickly weaned off after he would fall asleep and  his respiratory rate would decrease down to 8 per minute. He underwent  cardiac catheterization on November6,2006 which showed a normal left main,  LAD with mild to moderate luminal irregularities, first diagonal was  moderate size with mild luminal irregularities. Ramus intermedius was a  large vessel with a widely patent proximal stent. Circumflex was a very  small vessel approximately 1 mm with a mid occlusion after its first OM. It  did fill well with right-to-left collaterals. His RCA was dominant with mild  to moderate  luminal irregularities in two focal ectatic areas in the mid  vessel with no significant obstructive disease. His EF was 50% and no wall  motion abnormalities. Following the procedure he was transferred back to his  room. A closure device was successfully deployed in the cath lab. He was  then discharged home later that evening. He was to resume his prior  medications and these include aspirin, Nexium 40 milligrams daily, Demadex  40 milligrams every other day, Xanax 10 milligrams p.r.n., methadone 240  milligrams in the morning, 220 milligrams in the evening (his prior dose),  Cardizem CD 300 milligrams daily, Crestor 20 milligrams daily, AndroGel  ointment once daily and Plavix 75 milligrams daily.   SPECIAL INSTRUCTIONS:  I did discussed with him the importance of staying  clean and away from illicit drugs including cocaine, marijuana, tobacco etc.  The patient understands the importance of this for long-term health. He  will follow up with his pain clinic for continuing down titration of his  methadone, otherwise he will follow up with Dr. Bea Laura  at [facility]  cardiology in 2 weeks.  No further narcotics were prescribed as outpatient  due to patients narcotic dependence and ongoing substance abuse problems.      Kaylyn Lim., M.D.  Electronically Signed     TWK/MEDQ  D:  05/19/2005  T:  05/19/2005  Job:  [id number]   cc:   Ludwig Lean. Doreatha Lew, M.D.  Fax: [phone]

## 2010-11-28 NOTE — H&P (Signed)
NAME:  [name], [name] NO.:  [id number]   MEDICAL RECORD NO.:  [id number]                  PATIENT TYPE:   LOCATION:                                       FACILITY:  [facility]   PHYSICIAN:  Ludwig Lean. Doreatha Lew, M.D.            DATE OF BIRTH:  [birth year]   DATE OF ADMISSION:  12/08/2002  DATE OF DISCHARGE:                                HISTORY & PHYSICAL   CHIEF COMPLAINT:  Chest discomfort.   HISTORY OF PRESENT ILLNESS:  The patient is a 50 year old white male who has  had a known history of atherosclerotic cardiovascular disease.  He has had  previous revascularization.  He has had a reoccurrence of some degree of  chest discomfort that has been associated with lightheadedness and a feeling  of nausea; this is somewhat associated with exertion but at other times is  not.  He is subsequently referred for elective cardiac catheterization.   PAST MEDICAL HISTORY:  1. Atherosclerotic cardiovascular disease with previous angioplasty     performed in [facility] with stent placement to the LAD.  There are     reports of cardiac catheterization in December of '02 revealing mild     coronary ectasia of the proximal LAD in the mid right coronary;     otherwise, there were no significant stenotic places.  The previously     placed stent in the proximal segment of the first OM branch was widely     patent.  There was normal LV function.  2. Hypertension, currently well controlled.  3. Hyperlipidemia, recently started on Pravachol.  4. Testicular cancer with orchiectomy in 2002 with one remaining testicle     and currently on testosterone replacement.  5. Erectile dysfunction.  6. Narcotic dependency.  7. Sleep apnea.  8. Obesity.  9. Prior history of positive PPD.  10.      Minimal cigarette abuse.   ALLERGIES:  DARVOCET.   CURRENT MEDICATIONS:  1. Cardizem CD 300 mg per day.  2. Plavix 75 mg per day.  3. Baby aspirin daily.  4. Demadex 40 mg  b.i.d.  5. Wellbutrin 150 mg daily.  6. Xanax p.r.n. 2 mg.  7. Methadone one-half tablet b.i.d.  8. Nitroglycerin spray p.r.n.  9. Valium p.r.n.  10.      Nexium 40 mg per day.  11.      Pravachol 40 mg per day.  12.      Testosterone ointment.   FAMILY HISTORY:  Father died of a heart attack at age 24.  Mother is living  at age 64.   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  He recently moved  from [place] to the [place] area.  There is no alcohol and rarely  uses tobacco.  He lives at home with his fiancee of several years.   REVIEW OF SYSTEMS:  As noted above  and otherwise unremarkable.   PHYSICAL EXAMINATION:  VITAL SIGNS:  Weight is 277 pounds, blood pressure  130/80 sitting, 130/80 standing, heart rate is 92, respirations 18, he is  afebrile.  HEENT:  Unremarkable.  NECK:  Supple and full.  LUNGS:  Clear.  HEART:  Shows a regular rhythm without murmur.  ABDOMEN:  Obese but soft, positive bowel sounds, nontender.  EXTREMITIES:  Without edema.  Distal pulses are intact.  NEUROLOGIC:  Intact.   LABORATORY DATA:  Labs are pending.   OVERALL IMPRESSION:  1. Complains of chest discomfort in the setting of known atherosclerosis     with previous revascularization.  2. Hyperlipidemia with recent initiation of lipid-lowering agent.  3. Hypertension.  4. Narcotic dependency.  5. Obesity.  6. Sleep apnea.  7. Positive family history.   PLAN:  We will proceed on with elective cardiac catheterization.  The  procedure has been reviewed in full detail, and he is willing to proceed on  Friday, Dec 08, 2002.     Donnel Saxon C. Maxwell Caul, N.P.                 Ludwig Lean. Doreatha Lew, M.D.    LCO/MEDQ  D:  12/08/2002  T:  12/08/2002  Job:  [id number]   cc:   Youlanda Roys. Deatra Ina, M.D.  [address]  [place]  [place] [address]  Fax: [phone]

## 2010-11-28 NOTE — H&P (Signed)
[name], [name]                ACCOUNT NO.:  [id number]   MEDICAL RECORD NO.:  [id number]          PATIENT TYPE:  EMS   LOCATION:  MAJO                         FACILITY:  [facility]   PHYSICIAN:  Ezzard Standing, M.D.DATE OF BIRTH:  1960/12/06   DATE OF ADMISSION:  05/17/2005  DATE OF DISCHARGE:                                HISTORY & PHYSICAL   This 50 year old male is admitted to the hospital for treatment of chest  pain. The patient is a rambling, circuitous historian. He has a history of  coronary artery disease and had a stent placed in the intermediate branch in  [place], [place], several years ago. He states that he has moved  up here at [facility] and that Dr. Doreatha Lew does a catheterization on him  every year to check the stent. He had a cardiac catheterization last done by  Dr. Doreatha Lew in May 2004 showing normal left ventricular function . Right  coronary artery was a dominant vessel with some aneurysmal disease in the  mid portion. There was collaterals noted to the distal left circumflex. The  intermediate coronary artery was patent and a stent was present and patent  without restenosis. The left anterior descending reportedly was a large  vessel wrapped around the apex with irregularities, but no significant  disease.  No mention of a stent was made. It was recommended he be treated  with risk factor modification. He states he intermittently uses  nitroglycerin spray.  He was in the emergency room in April 206 and was seen  by Dr. Tamala Julian and signed out against advice at that time and later saw Dr.  Doreatha Lew in the emergency room.   The patient reports that over the past month that he has had episodic chest  pain, abdominal pain, and diarrhea and has had a colonoscopy and endoscopy  by Dr. Earlean Shawl. He also states that he has possible metastatic cancer and has  a liver lesion that is in the process of being evaluated.   The patient also has a history of narcotic  dependence and brings in a bottle  of high-dose methadone 220 mg that he says is given by [facility] in  [place]. I was not familiar with the prescribing physician. He states  that the doctor may a prison doctor that travels around to several cities.  He also states that he intermittently uses marijuana and states that he used  cocaine as recently as nine or ten days ago in the form of snorting.   He awoke this morning with severe midsternal chest discomfort that was  difficult to characterize. He since that time has had recurrent chest  discomfort lasting between 5 and 10 minutes and relieved with two  nitroglycerin sprays. He called me at approximately 12 noon and he was  advised to come to the emergency room. He, however, did not come to the  emergency room until several hours later. He is seen at the present time and  he is not currently having pain at the time of the present examination.   PAST MEDICAL HISTORY:  Remarkable for sleep  apnea. He has a history of  testicular cancer with orchiectomy in 2002, radiation therapy, and  testosterone replacement therapy. He has known hyperlipidemia, hypertension.  He has a history of narcotic dependency, and obesity.   ALLERGIES:  DARVOCET.   PAST SURGICAL HISTORY:  Orchiectomy.   CURRENT MEDICATIONS:  He has not been on Plavix for a month. He is on  aspirin daily, Nexium 40 mg daily, Demadex 40  mg every other day, Xanax 10  mg p.r.n., methadone he states he is on 240 mg in the morning and 220 in the  evening, Cardizem CD 300 mg daily, Crestor 20 mg daily, AndroGel ointment  daily.   FAMILY HISTORY:  Father died of an MI at age 26.  Mother alive and well.   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 He continues to smoke cigarettes of less than a  pack a day, probably a few per day.  He states he does not use alcohol,  substance abuse with cocaine and marijuana as noted  above.   REVIEW OF SYSTEMS:  He states that he has been evaluated at [facility] and has a  nodule in the liver that is in the process of being evaluated. He is obese.  He does not get regular exercise. 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   PHYSICAL EXAMINATION:  GENERAL: On examination  he is an obese male in no  acute distress.  VITAL SIGNS: His blood pressure is 130/70, pulse 70.  SKIN: Healed scars on his arms and his hands, and he says there are healed  abscesses, methicillin-resistant Staphylococcus aureus.  HEENT: EOMI. PERRLA. CNS clear. Fundi not examined. Pharynx negative.  NECK: Supple without masses, JVD, thyromegaly, or bruits.  LUNGS: Clear, mild gynecomastia noted.  CARDIAC: Normal S1 and S2. No S3, S4, or murmur.  ABDOMEN: Soft, nontender. Femoral distal pulses are 2+.  NEUROLOGIC: Normal.   EKG reveals nonspecific ST abnormality.   IMPRESSION:  1.  Chest discomfort. Rule out acute coronary syndrome or unstable angina.      Initial cardiac enzymes are negative and EKG is nonspecific.  2.  Coronary artery disease with previous stent and intermediate, possible      intervention of the [name] although uncharacterized at this time.  3.  History of testicular cancer, status post orchiectomy and radiation      therapy.  4.  Hyperlipidemia.  5.  Sleep apnea.  6.  Hypertension.  7.  Substance abuse with cocaine, he admitted to recently.  8.  Narcotic dependence on very high dose methadone.  9.  Recent gastrointestinal illness with a liver lesion.   RECOMMENDATIONS:  The patient had a very circuitous tangential history. At  this time we will ask for pharmacy consult to assist with methadone  management. I was not familiar with the clinic or the prescribing physician.  Begin Lovenox, continue Cardizem, restart Plavix, and aspirin. Further workup by Dr.  Doreatha Lew probably to include cardiac catheterization.      Ezzard Standing, M.D.  Electronically Signed     WST/MEDQ  D:  05/17/2005  T:  05/17/2005  Job:  [id number]   cc:   Ludwig Lean. Doreatha Lew, M.D.  Fax: [phone]

## 2010-11-28 NOTE — Discharge Summary (Signed)
[name], [name]                ACCOUNT NO.:  [id number]   MEDICAL RECORD NO.:  [id number]          PATIENT TYPE:  INP   LOCATION:  1326                         FACILITY:  [facility]   PHYSICIAN:  Kathee Delton, MD,FCCPDATE OF BIRTH:  01/17/60   DATE OF ADMISSION:  09/17/2006  DATE OF DISCHARGE:  09/20/2006                               DISCHARGE SUMMARY   DISCHARGE DIAGNOSES:  1. Bilateral community-acquired pneumonia (no organism specified).  2. Chronic pain syndrome with multifactorial pain secondary to severe      stenosis of L1 through L5, recess stenosis of the L5 through S1.  3. History of obstructive sleep apnea.   LABORATORY DATA:  Date:  September 20, 2006:  Hemoglobin 11.3, white blood  cell count 7.6, platelet count 178.  Date:  September 20, 2006:  Sodium 141,  potassium 3.2, chloride 107, CO2 29, BUN 4, creatinine 0.87, glucose  136.   MICROBIOLOGY:  Sputum culture currently no organisms to date.  Legionella urinary antigen negative.  Strep urinary antigen negative,  both obtained September 17, 2006.   RADIOLOGY:  Portable film of chest obtained on September 20, 2006,  demonstrates marked improvement in bilateral basilar infiltrates with  significant increase in aeration.   BRIEF HISTORY:  A 50 year old white male of whom Dr. Joya Gaskins has seen in  the past in 2004 for obstructive sleep apnea.  Presented with a 3 day  history of fever as high as 102 degrees, notable rusty-colored sputum,  shortness of breath, and congestion.  He had Augmentin around the house  for which he took 5 doses at 875 mg.  He noted that he passed out  sometime the morning of presentation and awoke feeling quite weak,  reporting significant right-sided chest pain which was sharp and  stabbing in nature.  He smokes 2-3 cigarettes a day.  He notes increased  acid reflux recently.  He has lost some weight in the recent history and  because of this, has not been using his CPAP machine.  He was seen by  Dr.  Joya Gaskins in an acute office visit with chest x-ray demonstrating  bilateral infiltrates.  He also was notable for having low blood  pressure and low oxygenation by pulse oximetry.  He was admitted for  further evaluation and therapy.   PAST MEDICAL HISTORY:  1. Hypertension.  2. Coronary artery disease with stents in 2002.  3. History of testicular cancer with resection.  4. Depression.  5. Obstructive sleep apnea.  6. Chronic pain syndrome.   HOSPITAL COURSE BY DISCHARGE DIAGNOSIS:  #1 - BILATERAL COMMUNITY-  ACQUIRED PNEUMONIA (NO ORGANISM SPECIFIED).  [name] was admitted to  the pulmonary service.  Diagnostic and therapeutic measurements included  urine strep, Legionella antigens both of which were negative, portable  chest x-ray demonstrated bilateral pulmonary infiltrates.  Sputum  culture demonstrated normal oropharyngeal flora  [name] was treated  empirically with IV Rocephin and azithromycin.  He is now on day #3  antibiotic therapy.  Upon time of discharge, he is afebrile.  He  initially presented with significant leukocytosis with a white  blood  cell count of 14.6.  Upon time of discharge, his white blood cell count  is now 7.6.  He has been afebrile for over 48 hours.  Upon time of  discharge, his followup chest x-ray demonstrates markedly improved  bilateral pulmonary infiltrates.  He is ambulating comfortably  throughout the hospital ward on room air support without significant  dyspnea and currently feels he is close to baseline from pulmonary  status.  From a pulmonary standpoint, he has reached maximum inpatient  benefit from hospital care.  He will therefore be discharged to home  with a prescription of Avelox, instructed to take 1 tablet daily x7 more  days then discontinue.  Furthermore, he has followup with Dr. Joya Gaskins on  Tuesday, March 18, at 1:30 p.m.   #2 - CHRONIC PAIN SYNDROME WITH HISTORY OF NARCOTIC DEPENDENCY:  Mr.  Mcquigg was on quite a  significantly high dose of narcotics prior to  admission.  Because of his hypotension, he has continued on half-  strength narcotic dosing compared to his regular regimen.  Prior to  admission, he was on MS Contin 100 mg b.i.d., as well as MSIR 30 mg  q.4h. p.r.n.  Upon time of discharge, he informs me that his  prescription has run out for both of these medications.  Dr. Nelva Bush with  [facility] has been managing his narcotics in the  past, and therefore have made him a 3:00 appointment with Dr. Nelva Bush  today to help address his narcotic needs.  I spoke at length with Mr.  Space today about the importance that only one prescriber be  responsible for his narcotic management and informed that [facility] would not take responsibility for his pain control.  Mr.  Blazejewski was in agreement to this plan and understood the rationale for  this.   #3 - OBSTRUCTIVE SLEEP APNEA:  The patient refuses CPAP at this time,  claims that he has lost over 40 pounds of weight, and this has markedly  improved; however, he does report he still snores.  Perhaps further  followup in the outpatient setting would be beneficial, specifically  perhaps followup polysomnogram test.  [name] does report that the  primary reason for his noncompliance for CPAP is his face mask  apparatus.  He would be interested in evaluating other options.   DISCHARGE INSTRUCTIONS:  1. Diet as tolerated.  2. Activity as tolerated.  3. Follow up with Dr. Asencion Noble, March 18 at 1:30.  4. Dr. Nelva Bush today, September 20, 2006.   MEDICATIONS:  1. Reglan 10 mg tab before meals and q.h.s.  2. Wellbutrin 150 mg tab daily.  3. Flomax 0.4 mg daily.  4. Plavix 75 mg daily.  5. AndroGel ointment as prescribed previously.  6. Morphine ER and morphine IR as prescribed by Dr. Nelva Bush.  7. Avelox 400 mg tabs 1 tab daily x7 more days.  Upon time of discharge, [name] is back to baseline from a pulmonary   standpoint and has met maximum hospital benefit.      Salvadore Dom, NP      Kathee Delton, MD,FCCP  Electronically Signed    PB/MEDQ  D:  09/20/2006  T:  09/20/2006  Job:  [id number]   cc:   Burnett Harry. Joya Gaskins, MD, FCCP  [address] [address]   Richard D. Nelva Bush, M.D.  Fax: [phone]   Sigmund I. Gaynelle Arabian, M.D.  Fax: [phone]   [facility] Agapito Games, MD

## 2010-12-05 ENCOUNTER — Ambulatory Visit (INDEPENDENT_AMBULATORY_CARE_PROVIDER_SITE_OTHER): Payer: BC Managed Care – PPO | Admitting: Family Medicine

## 2010-12-05 ENCOUNTER — Encounter: Payer: Self-pay | Admitting: Family Medicine

## 2010-12-05 VITALS — BP 138/90 | HR 87 | Temp 97.7°F | Ht 72.0 in | Wt 244.0 lb

## 2010-12-05 DIAGNOSIS — F1120 Opioid dependence, uncomplicated: Secondary | ICD-10-CM

## 2010-12-05 DIAGNOSIS — F419 Anxiety disorder, unspecified: Secondary | ICD-10-CM

## 2010-12-05 DIAGNOSIS — F329 Major depressive disorder, single episode, unspecified: Secondary | ICD-10-CM

## 2010-12-05 DIAGNOSIS — Z8547 Personal history of malignant neoplasm of testis: Secondary | ICD-10-CM

## 2010-12-05 DIAGNOSIS — F32A Depression, unspecified: Secondary | ICD-10-CM

## 2010-12-05 DIAGNOSIS — F17200 Nicotine dependence, unspecified, uncomplicated: Secondary | ICD-10-CM

## 2010-12-05 DIAGNOSIS — F411 Generalized anxiety disorder: Secondary | ICD-10-CM

## 2010-12-05 DIAGNOSIS — Z720 Tobacco use: Secondary | ICD-10-CM

## 2010-12-05 DIAGNOSIS — F1320 Sedative, hypnotic or anxiolytic dependence, uncomplicated: Secondary | ICD-10-CM

## 2010-12-05 DIAGNOSIS — I2589 Other forms of chronic ischemic heart disease: Secondary | ICD-10-CM

## 2010-12-05 DIAGNOSIS — F3289 Other specified depressive episodes: Secondary | ICD-10-CM

## 2010-12-05 DIAGNOSIS — G8929 Other chronic pain: Secondary | ICD-10-CM

## 2010-12-05 DIAGNOSIS — M549 Dorsalgia, unspecified: Secondary | ICD-10-CM

## 2010-12-05 MED ORDER — CITALOPRAM HYDROBROMIDE 20 MG PO TABS: 20.0000 mg | ORAL_TABLET | Freq: Every day | ORAL | Status: DC

## 2010-12-05 MED ORDER — DIAZEPAM 10 MG PO TABS: 10.0000 mg | ORAL_TABLET | Freq: Four times a day (QID) | ORAL | Status: DC

## 2010-12-05 NOTE — Patient Instructions (Addendum)
Nice to meet you. We will request records from your doctor in [place]. Will make a referral to pain clinic in [place].  Please schedule f/u appointment with Dr. McDiarmid in next 1-2 weeks.

## 2010-12-09 ENCOUNTER — Ambulatory Visit: Payer: BC Managed Care – PPO | Admitting: Internal Medicine

## 2010-12-09 ENCOUNTER — Encounter: Payer: Self-pay | Admitting: Family Medicine

## 2010-12-09 DIAGNOSIS — F32A Depression, unspecified: Secondary | ICD-10-CM | POA: Insufficient documentation

## 2010-12-09 DIAGNOSIS — F1120 Opioid dependence, uncomplicated: Secondary | ICD-10-CM | POA: Insufficient documentation

## 2010-12-09 DIAGNOSIS — F17210 Nicotine dependence, cigarettes, uncomplicated: Secondary | ICD-10-CM | POA: Insufficient documentation

## 2010-12-09 DIAGNOSIS — Z8547 Personal history of malignant neoplasm of testis: Secondary | ICD-10-CM | POA: Insufficient documentation

## 2010-12-09 DIAGNOSIS — F329 Major depressive disorder, single episode, unspecified: Secondary | ICD-10-CM | POA: Insufficient documentation

## 2010-12-09 DIAGNOSIS — F1320 Sedative, hypnotic or anxiolytic dependence, uncomplicated: Secondary | ICD-10-CM | POA: Insufficient documentation

## 2010-12-09 NOTE — Assessment & Plan Note (Signed)
Pain seems to significantly impair function, but not changed today. Goal is to improve functionality with minimal side effects. This will be difficult given his long-time dependence on large quantities of narcotics. Patient believes he is not a candidate for back surgery and brings stacks of orthopedic records from [place]. Will make referral to pain clinic today. Have requested med records from [facility] to confirm his medication history.

## 2010-12-09 NOTE — Assessment & Plan Note (Signed)
Being followed closely by Corinda Gubler and Dr. Clarise Cruz. Heart function stable/improved after ICD placement. Titration of coreg and lisinopril ongoing. No signs of CHF exacerbation currently and BP is stable. Patient has appointment for follow up in 2 weeks.
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
## 2010-12-09 NOTE — Assessment & Plan Note (Signed)
Likely the cause of patient's transient neurologic problems in congruence with benzodiazepine dependence. Pain clinic referral today. Records from [place], [place] physician requested.

## 2010-12-09 NOTE — Assessment & Plan Note (Signed)
Patient denies ever being treated for this. Will start celexa today with hopes of improving depression and anxiety. Follow up in 2 weeks.

## 2010-12-09 NOTE — Assessment & Plan Note (Signed)
Patient has been dependent on benzodiazepines for many decades. Discussed the utility of better medications being SSRIs. WIll start celexa with a plan to gradually wean valium 10mg  QID. Refilled a 2 week supply today and patient to follow up in clinic to assess clinical status. Goal to minimize dose and better control anxiety.

## 2010-12-15 ENCOUNTER — Ambulatory Visit (INDEPENDENT_AMBULATORY_CARE_PROVIDER_SITE_OTHER): Payer: BC Managed Care – PPO | Admitting: Psychology

## 2010-12-15 DIAGNOSIS — F4323 Adjustment disorder with mixed anxiety and depressed mood: Secondary | ICD-10-CM

## 2010-12-16 ENCOUNTER — Telehealth: Payer: Self-pay | Admitting: Internal Medicine

## 2010-12-16 NOTE — Telephone Encounter (Signed)
Pt calling to rs no show appt 5-29 and next available is 8-1 - and pt has questions for nurse

## 2010-12-16 NOTE — Telephone Encounter (Signed)
I talked with pt. Pt missed appt with Dr Haroldine Laws 12/09/10 because he was out of town. Dr Haroldine Laws has no office openings in the next week or so. Pt has appt with Dr Caryl Comes 12/24/10. Pt still would like to see Dr Haroldine Laws in the next week  or so. I have given pt appt with Digby Groeneveld 12/25/10 -Dr Haroldine Laws is also in the office that morning. Pt agreed with this plan. Pt states he is not having any new symptoms he is just due to see Dr Haroldine Laws.

## 2010-12-17 ENCOUNTER — Encounter: Payer: Self-pay | Admitting: Family Medicine

## 2010-12-18 ENCOUNTER — Ambulatory Visit (INDEPENDENT_AMBULATORY_CARE_PROVIDER_SITE_OTHER): Payer: BC Managed Care – PPO | Admitting: Family Medicine

## 2010-12-18 ENCOUNTER — Telehealth: Payer: Self-pay | Admitting: *Deleted

## 2010-12-18 NOTE — Telephone Encounter (Signed)
Informed of pain clinic appointment 01/20/2011 @ 1:30pm [facility] with dr. Wynn Banker [address] [phone]. Patient informed of new pt packet being sent out by pain clinic and information including, drug screen being preformed and that this is only an evaluation then will decide what treatment is needed and will be done. Informed that results take 2 weeks for urine test and that no narcotics will be prescribed until that comes back and it will be determined by doctor is they will even be prescribed

## 2010-12-22 ENCOUNTER — Ambulatory Visit (INDEPENDENT_AMBULATORY_CARE_PROVIDER_SITE_OTHER): Payer: BC Managed Care – PPO | Admitting: Psychology

## 2010-12-22 DIAGNOSIS — F4323 Adjustment disorder with mixed anxiety and depressed mood: Secondary | ICD-10-CM

## 2010-12-24 ENCOUNTER — Other Ambulatory Visit: Payer: Self-pay

## 2010-12-24 ENCOUNTER — Ambulatory Visit (INDEPENDENT_AMBULATORY_CARE_PROVIDER_SITE_OTHER): Payer: BC Managed Care – PPO | Admitting: Internal Medicine

## 2010-12-24 ENCOUNTER — Encounter: Payer: Self-pay | Admitting: Internal Medicine

## 2010-12-24 VITALS — BP 100/78 | Ht 72.0 in | Wt 234.0 lb

## 2010-12-24 DIAGNOSIS — I5042 Chronic combined systolic (congestive) and diastolic (congestive) heart failure: Secondary | ICD-10-CM

## 2010-12-24 DIAGNOSIS — Z95810 Presence of automatic (implantable) cardiac defibrillator: Secondary | ICD-10-CM

## 2010-12-24 DIAGNOSIS — F1120 Opioid dependence, uncomplicated: Secondary | ICD-10-CM

## 2010-12-24 DIAGNOSIS — I2589 Other forms of chronic ischemic heart disease: Secondary | ICD-10-CM

## 2010-12-24 NOTE — Progress Notes (Signed)
HPI  [name] is a 50 y.o. male Seen in followup for ICD implantation in the setting of ischemic cardiomyopathy with ejection fraction of 25% range not amenable to revascularization. He also had complex ventricular ectopy. He is part of the analyzed ST trial.  He also has history of tobacco use,  obesity, OSA, depression, chronic back pain on high-dose narcotics   Cath showed (2/11):  LM: ostial 20% LAD: Diffuse  40% prox, mid 60-70% focal lesion.  D1: 30% tubular lesion. LCX: gave off a ramus branch, small OM-1.  The distal AV groove circ was subtotally occluded which was chronic.  In the ramus branch, there was evidence of a previously placed stent.  This was now totally occluded.  In the OM-1, there was a 40% proximal lesion. RCA:  dominant vessel, had diffuse 40-50% disease throughout.  In the distal RCA, prior to the PDA, there was a tubular 40% lesion.  In the distal RCA just after the takeoff of the PDA, there was a 99% lesion with a near subtotal occlusion.  In the PDA, there was a 50% tubular lesion distally.  LV 20%.  Failed PCI of distal RCA.   We spent more than 40 minutes discussing the plan for his detoxification from his chronic narcotic addiction.  Past Medical History  Diagnosis Date  . Chronic systolic heart failure     EF 20-25%. s/p ST. Jude ICD  . CAD (coronary artery disease)     Last cath 2/12. 3-v CAD. Failed PCI of distal RCA  . Chronic back pain     lumbar stenosis  . Benign prostatic hypertrophy   . Depression   . History of testicular cancer   . Narcotic dependence     chronic  . Benzodiazepine dependence     chronic  . Anxiety   . CHF (congestive heart failure)     Past Surgical History  Procedure Date  . Hernia repair   . Asd repair, sinus venosus   . Testicle surgery     testicular cancer surgery  . Cardiac defibrillator placement 08/2010    Current Outpatient Prescriptions  Medication Sig Dispense Refill  . alfuzosin (UROXATRAL)  10 MG 24 hr tablet Take 1 tablet (10 mg total) by mouth daily. As needed  30 tablet  0  . aspirin 81 MG tablet Take 81 mg by mouth daily.        . carvedilol (COREG) 6.25 MG tablet Take 1 & 1/2 tabs Twice daily  90 tablet  3  . citalopram (CELEXA) 20 MG tablet Take 1 tablet (20 mg total) by mouth daily.  30 tablet  11  . clopidogrel (PLAVIX) 75 MG tablet Take 75 mg by mouth daily.        . diazepam (VALIUM) 10 MG tablet Take 1 tablet (10 mg total) by mouth 4 (four) times daily.  60 tablet  0  . HYDROmorphone (DILAUDID) 8 MG tablet Take 8 mg by mouth every 6 (six) hours as needed.        . lactulose (CHRONULAC) 10 GM/15ML solution Take 20 g by mouth as needed.        Marland Kitchen lisinopril (PRINIVIL,ZESTRIL) 10 MG tablet Take 1 tab by mouth daily  30 tablet  6  . morphine (KADIAN) 60 MG 24 hr capsule Take 60 mg by mouth every 8 (eight) hours.        . nitroGLYCERIN (NITROSTAT) 0.4 MG SL tablet Place 0.4 mg under the tongue every 5 (five)  minutes as needed.        . rosuvastatin (CRESTOR) 40 MG tablet Take 40 mg by mouth daily.        Marland Kitchen spironolactone (ALDACTONE) 25 MG tablet Take 25 mg by mouth daily.        Marland Kitchen testosterone cypionate (DEPOTESTOTERONE CYPIONATE) 200 MG/ML injection Inject into the muscle every 14 (fourteen) days.        Marland Kitchen DISCONTD: isosorbide mononitrate (IMDUR) 30 MG 24 hr tablet Take 30 mg by mouth daily.          Allergies  Allergen Reactions  . Nalbuphine   . Nubain (Nalbuphine Hcl)     Review of Systems negative except from HPI and PMH  Physical Exam Ill-appearing.   Assessment and  Plan

## 2010-12-24 NOTE — Patient Instructions (Signed)
Your physician recommends that you schedule a follow-up appointment on : 01/13/11 @ 11:15am with Dr. Gala Romney.

## 2010-12-24 NOTE — Assessment & Plan Note (Signed)
I spent more than 30-45 minutes discussing with Mr. Clinard the importance of trying to pursue cogent plan for dealing with his  drug dependence.

## 2010-12-24 NOTE — Assessment & Plan Note (Signed)
The patient's device was interrogated.  The information was reviewed. No changes were made in the programming.    

## 2010-12-25 ENCOUNTER — Ambulatory Visit: Payer: BC Managed Care – PPO | Admitting: Physician Assistant

## 2010-12-25 ENCOUNTER — Telehealth: Payer: Self-pay | Admitting: Internal Medicine

## 2010-12-25 NOTE — Telephone Encounter (Signed)
Pt has question re meds.

## 2010-12-25 NOTE — Telephone Encounter (Signed)
Pt thought he had a paper that said decrease Lisinopril to 2.5 mg bid he is confused needs to know what dose he is suppose to be on looks like 10 mg I don't see where it was decreased but will double check w/Dr Bensimhon in AM.  Also he would like a referral to Dr Channing Mutters in [place] for his back pain, will have to discuss w/Dr Bensimhon

## 2010-12-26 NOTE — Telephone Encounter (Signed)
Per Dr Gala Romney and Dr Graciela Husbands pt should be on lisinopril 10 mg daily, ok to refer to Dr Channing Mutters in [place], called his office at [phone] they state pt must have an MRI or CT with in last 6 months to see neurosurgery, will check w/pt, Left message to call back

## 2010-12-29 ENCOUNTER — Ambulatory Visit (INDEPENDENT_AMBULATORY_CARE_PROVIDER_SITE_OTHER): Payer: BC Managed Care – PPO | Admitting: Psychology

## 2010-12-29 DIAGNOSIS — F4323 Adjustment disorder with mixed anxiety and depressed mood: Secondary | ICD-10-CM

## 2010-12-30 ENCOUNTER — Telehealth: Payer: Self-pay | Admitting: Family Medicine

## 2010-12-30 NOTE — Telephone Encounter (Signed)
Patient is asking to speak with Medical Director re: being seen by Dr. Wendy Poet. Pt was seen in the hospital & was d/c with an appt with Mcdiarmid, pt then says he received a call from our office from someone that did not leave their name asking how he got this appt with Mcdiarmid. Pt was then told he could not follow Mcdiarmid b/c he was not taking new patients, pt was upset so Mcdiarmid agreed to see him one time then he would need to follow [place] or [place]. Patient agreed but showed up late to his appt on 6/7 so was rescheduled by [facility]. Dr Wendy Poet then said pt needed to be rescheduled since he was late and he would need to follow one of the interns. Patient called today thinking he had an appt with Mcdiarmid not [place] & I tried explaining the situation but pt did not want to listen. He agreed to keep his appt on 6/21 but still wants to speak with Director.

## 2010-12-31 NOTE — Telephone Encounter (Signed)
Maureen Ralphs in [place] was seeing pt today she was going to let him know about not being able to get in with Dr Channing Mutters she states he is seeing a pain clinic at [facility] who is wanting him to go in for detox

## 2010-12-31 NOTE — Telephone Encounter (Signed)
Tried to cal home number rang then got fax tone Tried to call mobile number - left message that I called

## 2011-01-01 ENCOUNTER — Ambulatory Visit: Payer: BC Managed Care – PPO | Admitting: Family Medicine

## 2011-01-05 ENCOUNTER — Ambulatory Visit (INDEPENDENT_AMBULATORY_CARE_PROVIDER_SITE_OTHER): Payer: BC Managed Care – PPO | Admitting: Psychology

## 2011-01-05 DIAGNOSIS — F4323 Adjustment disorder with mixed anxiety and depressed mood: Secondary | ICD-10-CM

## 2011-01-08 NOTE — Telephone Encounter (Signed)
Has appt with Dr Loraine Maple.   If he still wishes to speak with me after that I will be happy to talk with him

## 2011-01-12 ENCOUNTER — Ambulatory Visit (INDEPENDENT_AMBULATORY_CARE_PROVIDER_SITE_OTHER): Payer: BC Managed Care – PPO | Admitting: Psychology

## 2011-01-12 DIAGNOSIS — F4323 Adjustment disorder with mixed anxiety and depressed mood: Secondary | ICD-10-CM

## 2011-01-13 ENCOUNTER — Ambulatory Visit: Payer: BC Managed Care – PPO | Admitting: Internal Medicine

## 2011-01-15 ENCOUNTER — Ambulatory Visit: Payer: BC Managed Care – PPO | Admitting: Family Medicine

## 2011-01-16 ENCOUNTER — Encounter: Payer: Self-pay | Admitting: Internal Medicine

## 2011-01-20 ENCOUNTER — Ambulatory Visit: Payer: BC Managed Care – PPO | Admitting: Physical Medicine & Rehabilitation

## 2011-01-21 ENCOUNTER — Ambulatory Visit (INDEPENDENT_AMBULATORY_CARE_PROVIDER_SITE_OTHER): Payer: BC Managed Care – PPO | Admitting: Internal Medicine

## 2011-01-21 ENCOUNTER — Encounter: Payer: Self-pay | Admitting: Internal Medicine

## 2011-01-21 VITALS — BP 124/88 | HR 80 | Ht 72.0 in | Wt 236.0 lb

## 2011-01-21 DIAGNOSIS — I5022 Chronic systolic (congestive) heart failure: Secondary | ICD-10-CM

## 2011-01-21 DIAGNOSIS — R0989 Other specified symptoms and signs involving the circulatory and respiratory systems: Secondary | ICD-10-CM

## 2011-01-22 ENCOUNTER — Telehealth: Payer: Self-pay | Admitting: *Deleted

## 2011-01-22 ENCOUNTER — Telehealth: Payer: Self-pay | Admitting: Internal Medicine

## 2011-01-22 DIAGNOSIS — C6290 Malignant neoplasm of unspecified testis, unspecified whether descended or undescended: Secondary | ICD-10-CM

## 2011-01-22 MED ORDER — LACTULOSE 10 GM/15ML PO SOLN: 20.0000 g | ORAL | Status: DC | PRN

## 2011-01-22 MED ORDER — ALFUZOSIN HCL ER 10 MG PO TB24: 10.0000 mg | ORAL_TABLET | Freq: Every day | ORAL | Status: DC

## 2011-01-22 NOTE — Telephone Encounter (Signed)
[name], Just wanted to let you know that I received fax from [facility] center for pain and rehab medicine today. The patient had an appointment with them 01-20-2011 and cancelled due to his flight being delayed. The appointment has been rescheduled for 03-05-2020. If the patient misses/cancels this appointment patient will not be accepted to this

## 2011-01-22 NOTE — Telephone Encounter (Signed)
Pt needs to talk to someone about his medications

## 2011-01-22 NOTE — Telephone Encounter (Signed)
PT CALLED NEEDING SOME REFILLS ON  NON CARDIAC MEDS FILLED  LACTULOSE AND ALFUZOSIN 10 MG WITH 1 REFILLS PER PT DOES NOT A HAVE PMD AT THIS TIME   ALSO STATED NEEDED ISOSORBIDE  REVIEWED CHART APPEARS TO HAVE BEEN DISCONTINUED  DID NOT FILL  PT ALSO NEEDING APPT WITH DR BENSIMHON  FIRST AVAILABLE NOT TIL December WILL FORWARD TO [facility] FOR REVIEW . PT UNABLE  TO SEE MD  AT LAST APPT HAD FAMILY EMERGENCY .

## 2011-01-24 NOTE — Progress Notes (Signed)
Patient left before being seen due to emergency. Will reschedule.

## 2011-01-30 NOTE — Telephone Encounter (Signed)
Can he get alfuzosin from his urologist. We can do the lactulose. Pls schedule him in HF clinic at [facility] in early august.

## 2011-02-10 ENCOUNTER — Observation Stay (HOSPITAL_COMMUNITY)
Admission: EM | Admit: 2011-02-10 | Discharge: 2011-02-11 | DRG: 143 | Disposition: A | Discharge status: Home / Self Care | Payer: BC Managed Care – PPO | Attending: Internal Medicine | Admitting: Internal Medicine

## 2011-02-10 ENCOUNTER — Telehealth: Payer: Self-pay | Admitting: Internal Medicine

## 2011-02-10 ENCOUNTER — Emergency Department (HOSPITAL_COMMUNITY): Payer: BC Managed Care – PPO

## 2011-02-10 DIAGNOSIS — Z8249 Family history of ischemic heart disease and other diseases of the circulatory system: Secondary | ICD-10-CM | POA: Insufficient documentation

## 2011-02-10 DIAGNOSIS — Z8547 Personal history of malignant neoplasm of testis: Secondary | ICD-10-CM | POA: Insufficient documentation

## 2011-02-10 DIAGNOSIS — Z8673 Personal history of transient ischemic attack (TIA), and cerebral infarction without residual deficits: Secondary | ICD-10-CM | POA: Insufficient documentation

## 2011-02-10 DIAGNOSIS — M48061 Spinal stenosis, lumbar region without neurogenic claudication: Secondary | ICD-10-CM | POA: Insufficient documentation

## 2011-02-10 DIAGNOSIS — N400 Enlarged prostate without lower urinary tract symptoms: Secondary | ICD-10-CM | POA: Insufficient documentation

## 2011-02-10 DIAGNOSIS — R079 Chest pain, unspecified: Principal | ICD-10-CM | POA: Insufficient documentation

## 2011-02-10 DIAGNOSIS — F329 Major depressive disorder, single episode, unspecified: Secondary | ICD-10-CM | POA: Insufficient documentation

## 2011-02-10 DIAGNOSIS — I2589 Other forms of chronic ischemic heart disease: Secondary | ICD-10-CM | POA: Insufficient documentation

## 2011-02-10 DIAGNOSIS — Z95810 Presence of automatic (implantable) cardiac defibrillator: Secondary | ICD-10-CM | POA: Insufficient documentation

## 2011-02-10 DIAGNOSIS — I10 Essential (primary) hypertension: Secondary | ICD-10-CM | POA: Insufficient documentation

## 2011-02-10 DIAGNOSIS — F1920 Other psychoactive substance dependence, uncomplicated: Secondary | ICD-10-CM | POA: Insufficient documentation

## 2011-02-10 DIAGNOSIS — F17200 Nicotine dependence, unspecified, uncomplicated: Secondary | ICD-10-CM | POA: Insufficient documentation

## 2011-02-10 DIAGNOSIS — I2510 Atherosclerotic heart disease of native coronary artery without angina pectoris: Secondary | ICD-10-CM | POA: Insufficient documentation

## 2011-02-10 DIAGNOSIS — F1320 Sedative, hypnotic or anxiolytic dependence, uncomplicated: Secondary | ICD-10-CM | POA: Insufficient documentation

## 2011-02-10 DIAGNOSIS — I498 Other specified cardiac arrhythmias: Secondary | ICD-10-CM | POA: Insufficient documentation

## 2011-02-10 DIAGNOSIS — R42 Dizziness and giddiness: Secondary | ICD-10-CM | POA: Insufficient documentation

## 2011-02-10 DIAGNOSIS — G4733 Obstructive sleep apnea (adult) (pediatric): Secondary | ICD-10-CM | POA: Insufficient documentation

## 2011-02-10 DIAGNOSIS — E785 Hyperlipidemia, unspecified: Secondary | ICD-10-CM | POA: Insufficient documentation

## 2011-02-10 DIAGNOSIS — F3289 Other specified depressive episodes: Secondary | ICD-10-CM | POA: Insufficient documentation

## 2011-02-10 DIAGNOSIS — Z9861 Coronary angioplasty status: Secondary | ICD-10-CM | POA: Insufficient documentation

## 2011-02-10 DIAGNOSIS — J449 Chronic obstructive pulmonary disease, unspecified: Secondary | ICD-10-CM | POA: Insufficient documentation

## 2011-02-10 DIAGNOSIS — J4489 Other specified chronic obstructive pulmonary disease: Secondary | ICD-10-CM | POA: Insufficient documentation

## 2011-02-10 LAB — RAPID URINE DRUG SCREEN, HOSP PERFORMED
Amphetamines: NOT DETECTED
Barbiturates: NOT DETECTED
Benzodiazepines: POSITIVE — AB
Cocaine: NOT DETECTED
Opiates: POSITIVE — AB
Tetrahydrocannabinol: POSITIVE — AB

## 2011-02-10 LAB — DIFFERENTIAL
Basophils Absolute: 0 10*3/uL (ref 0.0–0.1)
Basophils Relative: 0 % (ref 0–1)
Eosinophils Absolute: 0.3 10*3/uL (ref 0.0–0.7)
Eosinophils Relative: 4 % (ref 0–5)
Lymphocytes Relative: 22 % (ref 12–46)
Lymphs Abs: 1.6 10*3/uL (ref 0.7–4.0)
Monocytes Absolute: 0.7 10*3/uL (ref 0.1–1.0)
Monocytes Relative: 10 % (ref 3–12)
Neutro Abs: 4.9 10*3/uL (ref 1.7–7.7)
Neutrophils Relative %: 64 % (ref 43–77)

## 2011-02-10 LAB — BASIC METABOLIC PANEL
BUN: 13 mg/dL (ref 6–23)
CO2: 27 mEq/L (ref 19–32)
Calcium: 10.2 mg/dL (ref 8.4–10.5)
Chloride: 96 mEq/L (ref 96–112)
Creatinine, Ser: 0.95 mg/dL (ref 0.50–1.35)
GFR calc Af Amer: >60 mL/min (ref >60)
GFR calc non Af Amer: >60 mL/min (ref >60)
Glucose, Bld: 116 mg/dL — ABNORMAL HIGH (ref 70–99)
Potassium: 3.7 mEq/L (ref 3.5–5.1)
Sodium: 137 mEq/L (ref 135–145)

## 2011-02-10 LAB — CBC
HCT: 49.3 % (ref 39.0–52.0)
Hemoglobin: 17.7 g/dL — ABNORMAL HIGH (ref 13.0–17.0)
MCH: 32.8 pg (ref 26.0–34.0)
MCHC: 35.9 g/dL (ref 30.0–36.0)
MCV: 91.5 fL (ref 78.0–100.0)
Platelets: 152 10*3/uL (ref 150–400)
RBC: 5.39 MIL/uL (ref 4.22–5.81)
RDW: 12.8 % (ref 11.5–15.5)
WBC: 7.6 10*3/uL (ref 4.0–10.5)

## 2011-02-10 LAB — CK TOTAL AND CKMB (NOT AT ARMC)
CK, MB: 5.3 ng/mL — ABNORMAL HIGH (ref 0.3–4.0)
Relative Index: 1.6 (ref 0.0–2.5)
Total CK: 339 U/L — ABNORMAL HIGH (ref 7–232)

## 2011-02-10 LAB — ETHANOL: Alcohol, Ethyl (B): <11 mg/dL (ref 0–11)

## 2011-02-10 LAB — TROPONIN I: Troponin I: <0.3 ng/mL (ref <0.30)

## 2011-02-10 NOTE — Telephone Encounter (Addendum)
Received Certified Letter Via Mail Elmo Putt signed for) from [facility] @ [place] enclosed was a Advertising account executive for Records I have Interofficed all to Attn: Sherlon Handing on [place] 02/10/11/KM   Letter received from [facility] @ [place] they No Longer needs Records for this Pt ..have interoffice to [employer] @ [place]   02/16/11/km

## 2011-02-11 ENCOUNTER — Inpatient Hospital Stay (HOSPITAL_COMMUNITY): Payer: BC Managed Care – PPO

## 2011-02-11 DIAGNOSIS — R079 Chest pain, unspecified: Secondary | ICD-10-CM

## 2011-02-11 LAB — COMPREHENSIVE METABOLIC PANEL
ALT: 22 U/L (ref 0–53)
AST: 17 U/L (ref 0–37)
Albumin: 4.1 g/dL (ref 3.5–5.2)
Alkaline Phosphatase: 78 U/L (ref 39–117)
BUN: 13 mg/dL (ref 6–23)
CO2: 31 mEq/L (ref 19–32)
Calcium: 9.8 mg/dL (ref 8.4–10.5)
Chloride: 96 mEq/L (ref 96–112)
Creatinine, Ser: 0.91 mg/dL (ref 0.50–1.35)
GFR calc Af Amer: >60 mL/min (ref >60)
GFR calc non Af Amer: >60 mL/min (ref >60)
Glucose, Bld: 103 mg/dL — ABNORMAL HIGH (ref 70–99)
Potassium: 4.4 mEq/L (ref 3.5–5.1)
Sodium: 136 mEq/L (ref 135–145)
Total Bilirubin: 0.4 mg/dL (ref 0.3–1.2)
Total Protein: 7.3 g/dL (ref 6.0–8.3)

## 2011-02-11 LAB — HEMOGLOBIN A1C
Hgb A1c MFr Bld: 5.9 % — ABNORMAL HIGH (ref <5.7)
Mean Plasma Glucose: 123 mg/dL — ABNORMAL HIGH (ref <117)

## 2011-02-11 LAB — PRO B NATRIURETIC PEPTIDE: Pro B Natriuretic peptide (BNP): 227.5 pg/mL — ABNORMAL HIGH (ref 0–125)

## 2011-02-11 LAB — CARDIAC PANEL(CRET KIN+CKTOT+MB+TROPI)
CK, MB: 4.3 ng/mL — ABNORMAL HIGH (ref 0.3–4.0)
Relative Index: 1.8 (ref 0.0–2.5)
Total CK: 237 U/L — ABNORMAL HIGH (ref 7–232)
Troponin I: <0.3 ng/mL (ref <0.30)

## 2011-02-11 LAB — TSH: TSH: 0.497 u[IU]/mL (ref 0.350–4.500)

## 2011-02-11 MED ORDER — TECHNETIUM TC 99M TETROFOSMIN IV KIT
10.0000 | PACK | Freq: Once | INTRAVENOUS | Status: AC | PRN
  Administered 2011-02-11: 10 via INTRAVENOUS

## 2011-02-12 ENCOUNTER — Other Ambulatory Visit (HOSPITAL_COMMUNITY): Payer: BC Managed Care – PPO

## 2011-02-16 ENCOUNTER — Ambulatory Visit (INDEPENDENT_AMBULATORY_CARE_PROVIDER_SITE_OTHER): Payer: BC Managed Care – PPO | Admitting: Psychology

## 2011-02-16 DIAGNOSIS — F4323 Adjustment disorder with mixed anxiety and depressed mood: Secondary | ICD-10-CM

## 2011-02-16 NOTE — Telephone Encounter (Signed)
Pt sch for CHF clinic on 8/14

## 2011-02-20 NOTE — Discharge Summary (Signed)
[name], [name]                ACCOUNT NO.:  [id number]  MEDICAL RECORD NO.:  [id number]  LOCATION:  2035                         FACILITY:  [facility]  PHYSICIAN:  Denice Bors. Stanford Breed, MD, FACCDATE OF BIRTH:  1961/06/30  DATE OF ADMISSION:  02/10/2011 DATE OF DISCHARGE:  02/11/2011                              DISCHARGE SUMMARY   PROCEDURES:  Portable chest x-ray.  PRIMARY FINAL DISCHARGE DIAGNOSES:  Chest pain, cardiac enzymes negative for myocardial infarction and Myoview refused.  SECONDARY DIAGNOSES: 1. Status post stent to the circumflex in 2002. 2. Status post cardiac catheterization in January 2012 showing left     main 20%, left anterior descending 40%, then 70%, circumflex     subtotally occluded in the atrioventricular groove which was     chronic, ramus intermedius branch off the circumflex.  He had an     occluded stent, obtuse marginal 1 40%, right coronary artery 50%     then 99% just after the posterior descending artery, posterior     descending artery 50%, ejection fraction 20% (status post attempted     percutaneous coronary intervention that was unsuccessful). 3. Ischemic cardiomyopathy with an ejection fraction of 35-40% by     echocardiogram in April 2012. 4. 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 5. Obstructive sleep apnea. 6. Lumbar stenosis with chronic pain issues. 7. Benign prostatic hypertrophy. 8. Remote history of testicular cancer. 9. Depression. 10.History of transient neurologic dysfunction with a expressive     dysphasia in April 2012. 11.Allergy or intolerance to Darvon, Demerol, Nubain, pentazocine,     naloxone, naltrexone, buprenorphine and Stadol. 12.Status post orchiectomy as well as hernia repair, sinus surgery and     cath. 13.History of tobacco use. 14.Family history of either a coronary artery disease, a sudden death     in his father at age 69.  Time of discharge 34 minutes.  HOSPITAL COURSE:  [name] is a 50 year old  male with known coronary artery disease.  He had emotional stress and developed chest pressure. He was brought to the hospital where he was admitted for further evaluation and treatment.  His cardiac enzymes showed some elevation in the CKs and a minimal elevation in the MBs but the index was within normal limits and all troponins were negative.  A TSH was within normal limits at 0.457 and a BNP was only minimally elevated at 227.5.  A CMET had no significant abnormalities, an EtOH level was less than 11 and urine drug screen was positive for benzos, opiates and tetrahydrocannabinol.  Chest x-ray showed no acute disease.  Dr. Stanford Breed evaluated [name] on February 11, 2011.  He felt that since his cardiac enzymes were negative, a Myoview could be performed to further assess his chest pain.  However, [name] stated that he felt generally bad all over.  He felt that he had been cathed recently enough that a Myoview would not provide any new information.  He did not wish to undergo the procedure and refused it.  His chest pain had resolved. His volume status was felt to be at baseline.  He is ambulating without chest pain or shortness of breath and considered  stable for discharge on February 11, 2011.  DISCHARGE INSTRUCTIONS:  His activity level is to be increased gradually.  He is encouraged to stick to a low-sodium heart-healthy diet.  He is to follow up with Dr. Haroldine Laws on December 28 at 11:30. He is to follow up with Dr. Cheryln Manly and with [place] as needed as scheduled.  DISCHARGE MEDICATIONS: 1. Lactulose 10 mg per 15 mL q.2 days p.r.n. 2. Lisinopril 5 mg one-half tablet b.i.d. 3. Ambien CR 12.5 mg nightly. 4. Ativan 2 mg t.i.d. 5. Valium 10 mg q.i.d. as prior to admission. 6. Coreg 6.25 mg b.i.d. 7. Crestor 40 mg daily. 8. Demadex daily p.r.n. 9. Spirolactone 25 mg a day. 10.Sublingual nitroglycerin spray p.r.n. 11.Hydromorphone 8 mg q.3 h. as prior to  admission. 12.Morphine sulfate CR 30 mg b.i.d. as prior to admission. 13.Morphine sulfate CR 60 mg q.8 h. as prior to admission. 14.Aspirin 81 mg a day. 15.Plavix 75 mg a day. 16.Uroxatral 10 mg daily p.r.n.     Rosaria Ferries, PA-C   ______________________________ Denice Bors. Stanford Breed, MD, [facility]    RB/MEDQ  D:  02/11/2011  T:  02/12/2011  Job:  [id number]  cc:   Shanon Brow L. Cheryln Manly, PhD [facility]  Electronically Signed by Rosaria Ferries PA-C on 02/19/2011 01:52:44 PM Electronically Signed by Kirk Ruths MD [facility] on 02/20/2011 08:40:33 AM

## 2011-02-23 ENCOUNTER — Ambulatory Visit: Payer: BC Managed Care – PPO | Admitting: Psychology

## 2011-02-24 ENCOUNTER — Ambulatory Visit (HOSPITAL_COMMUNITY)
Admission: RE | Admit: 2011-02-24 | Discharge: 2011-02-24 | Disposition: A | Discharge status: Home / Self Care | Payer: BC Managed Care – PPO | Source: Ambulatory Visit | Attending: Internal Medicine | Admitting: Internal Medicine

## 2011-02-24 VITALS — BP 111/78 | HR 103 | Ht 73.0 in | Wt 236.0 lb

## 2011-02-24 DIAGNOSIS — I5042 Chronic combined systolic (congestive) and diastolic (congestive) heart failure: Secondary | ICD-10-CM | POA: Insufficient documentation

## 2011-02-24 DIAGNOSIS — I714 Abdominal aortic aneurysm, without rupture, unspecified: Secondary | ICD-10-CM | POA: Insufficient documentation

## 2011-02-24 DIAGNOSIS — I2589 Other forms of chronic ischemic heart disease: Secondary | ICD-10-CM

## 2011-02-24 DIAGNOSIS — I513 Intracardiac thrombosis, not elsewhere classified: Secondary | ICD-10-CM

## 2011-02-24 DIAGNOSIS — Z720 Tobacco use: Secondary | ICD-10-CM

## 2011-02-24 DIAGNOSIS — F17200 Nicotine dependence, unspecified, uncomplicated: Secondary | ICD-10-CM | POA: Insufficient documentation

## 2011-02-24 DIAGNOSIS — I2510 Atherosclerotic heart disease of native coronary artery without angina pectoris: Secondary | ICD-10-CM

## 2011-02-24 DIAGNOSIS — I219 Acute myocardial infarction, unspecified: Secondary | ICD-10-CM

## 2011-02-24 DIAGNOSIS — I739 Peripheral vascular disease, unspecified: Secondary | ICD-10-CM | POA: Insufficient documentation

## 2011-02-24 MED ORDER — LISINOPRIL 10 MG PO TABS: 10.0000 mg | ORAL_TABLET | Freq: Two times a day (BID) | ORAL | Status: DC

## 2011-02-24 MED ORDER — LISINOPRIL 20 MG PO TABS: ORAL_TABLET | ORAL | Status: DC

## 2011-02-24 NOTE — Assessment & Plan Note (Signed)
Doing well NYHA I-II. Volume status looks good. Will titrate lisinopril to 10 bid. Check echo looking for improvement in LV function.

## 2011-02-24 NOTE — Progress Notes (Signed)
HPI:  [name] is a 50 year old man with a history of polysubstance use,  obesity, OSA, depression, chronic back pain on high-dose narcotics and severe coronary artery disease complicated by an ischemic cardiomyopathy with an EF in a 25% range.  Last cath 2011 showed:  LM: ostial 20% LAD: Diffuse  40% prox, mid 60-70% focal lesion.  D1: 30% tubular lesion. LCX: gave off a ramus branch, small OM-1.  The distal AV groove circ was subtotally occluded which was chronic.  In the ramus branch, there was evidence of a previously placed stent.  This was now totally occluded.  In the OM-1, there was a 40% proximal lesion. RCA:  dominant vessel, had diffuse 40-50% disease throughout.  In the distal RCA, prior to the PDA, there was a tubular 40% lesion.  In the distal RCA just after the takeoff of the PDA, there was a 99% lesion with a near subtotal occlusion.  In the PDA, there was a 50% tubular lesion distally.  LV 20%.  Failed PCI of distal RCA.  Underwent ICD implantation. Now enrolled in [employer].   Admitted 2 weeks ago with atypical CP. CE normal. Myoview suggested but he refused.  HR was in 61s. Was told cut Coreg back but he kept it at the same dose.  Says he feels much better after he was placed on neurontin by [facility] Cheree Ditto). Eventually going to be treated with suboxone. Can walk a couple hundred yards before his legs bother him. Denies CP or dyspnea. No edema, orthopnea or PND. Not weighing every day because he doesn't have a scale. Not watching his diet.  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 Wants to know what to do about it.   ROS: All systems negative except as listed in HPI, PMH and Problem List.  Past Medical History  Diagnosis Date  . Chronic systolic heart failure     EF 20-25%. s/p ST. Jude ICD  . CAD (coronary artery disease)     Last cath 2/12. 3-v CAD. Failed PCI of distal RCA  . Chronic back pain     lumbar  stenosis  . Benign prostatic hypertrophy   . Depression   . History of testicular cancer   . Narcotic dependence     chronic  . Benzodiazepine dependence     chronic  . Anxiety   . CHF (congestive heart failure)     Current Outpatient Prescriptions  Medication Sig Dispense Refill  . alfuzosin (UROXATRAL) 10 MG 24 hr tablet Take 1 tablet (10 mg total) by mouth daily. As needed  30 tablet  2  . aspirin 81 MG tablet Take 81 mg by mouth daily.        . carvedilol (COREG) 6.25 MG tablet take 1 and 1/2 tab twice a day      . citalopram (CELEXA) 20 MG tablet Take 1 tablet (20 mg total) by mouth daily.  30 tablet  11  . clopidogrel (PLAVIX) 75 MG tablet Take 75 mg by mouth daily.        . diazepam (VALIUM) 10 MG tablet Take 1 tablet (10 mg total) by mouth 4 (four) times daily.  60 tablet  0  . gabapentin (NEURONTIN) 600 MG tablet Take 600 mg by mouth 2 (two) times daily.        Marland Kitchen lactulose (CHRONULAC) 10 GM/15ML solution Take 30 mLs (20 g total) by mouth as needed.  240 mL  1  . lisinopril (PRINIVIL,ZESTRIL) 10 MG tablet Take 1 tab by mouth daily  30 tablet  6  . morphine (MS CONTIN) 200 MG 12 hr tablet Take 200 mg by mouth 2 (two) times daily.        Marland Kitchen morphine (MS CONTIN) 60 MG 12 hr tablet Take 60 mg by mouth 2 (two) times daily.        . nitroGLYCERIN (NITROSTAT) 0.4 MG SL tablet Place 0.4 mg under the tongue every 5 (five) minutes as needed.        . rosuvastatin (CRESTOR) 40 MG tablet Take 40 mg by mouth daily.        Marland Kitchen spironolactone (ALDACTONE) 25 MG tablet Take 25 mg by mouth daily.        Marland Kitchen testosterone cypionate (DEPOTESTOTERONE CYPIONATE) 200 MG/ML injection Inject into the muscle every 14 (fourteen) days.        Marland Kitchen zolpidem (AMBIEN CR) 12.5 MG CR tablet Take 12.5 mg by mouth at bedtime as needed.          PHYSICAL EXAM: Filed Vitals:   02/24/11 1212  BP: 111/78  Pulse: 103   General:  Well appearing. No resp difficulty HEENT: normal Neck: supple. JVP flat. Carotids 2+  bilaterally; no bruits. No lymphadenopathy or thryomegaly appreciated. Cor: PMI normal. Regular rate & rhythm. No rubs, gallops or murmurs. Lungs: clear Abdomen: soft, nontender, nondistended. No hepatosplenomegaly. No bruits or masses. Good bowel sounds. Extremities: no cyanosis, clubbing, rash, edema Neuro: alert & oriented x 3, cranial nerves grossly intact. Moves all 4 extremities w/o difficulty. Affect pleasant.   ECG: NSR 90 No ST-T wave abnormalities.     ASSESSMENT & PLAN:

## 2011-02-24 NOTE — Assessment & Plan Note (Signed)
Counseled on need to stop smoking completely.

## 2011-02-24 NOTE — Assessment & Plan Note (Signed)
Needs CT of chest and ab to evaluate for AAA and mural thrombus.

## 2011-02-24 NOTE — Patient Instructions (Signed)
Increase Lisinopril to 10 mg Twice daily   Labs on Friday at [facility]  Your physician has requested that you have an echocardiogram. Echocardiography is a painless test that uses sound waves to create images of your heart. It provides your doctor with information about the size and shape of your heart and how well your heart's chambers and valves are working. This procedure takes approximately one hour. There are no restrictions for this procedure.  Non-Cardiac CT scanning, (CAT scanning), is a noninvasive, special x-ray that produces cross-sectional images of the body using x-rays and a computer. CT scans help physicians diagnose and treat medical conditions. For some CT exams, a contrast material is used to enhance visibility in the area of the body being studied. CT scans provide greater clarity and reveal more details than regular x-ray exams.  Your physician recommends that you schedule a follow-up appointment in: 6 weeks

## 2011-02-24 NOTE — Assessment & Plan Note (Signed)
No evidence of ischemia. Continue current regimen. Long talk about nature of his CAD and why he is not candidate for CABG at this time

## 2011-02-27 ENCOUNTER — Other Ambulatory Visit: Payer: BC Managed Care – PPO | Admitting: *Deleted

## 2011-03-02 ENCOUNTER — Ambulatory Visit: Payer: BC Managed Care – PPO | Admitting: Psychology

## 2011-03-04 NOTE — H&P (Signed)
[name], [name] NO.:  [id number]  MEDICAL RECORD NO.:  [id number]  LOCATION:  2035                         FACILITY:  [facility]  PHYSICIAN:  Harlon Flor, MD   DATE OF BIRTH:  1960/11/20  DATE OF ADMISSION:  02/10/2011 DATE OF DISCHARGE:                             HISTORY & PHYSICAL   PRIMARY CARDIOLOGIST:  Bevelyn Buckles. Bensimhon, MD  CHIEF COMPLAINT:  Chest pain.  HISTORY OF PRESENT ILLNESS:  [name] is a 50 year old white male with coronary artery disease and ischemic cardiomyopathy who presents to the emergency room tonight with chest pain.  He was driving home tonight when he was stopped by an off-duty sheriff due to concerns for intoxication.  She had called police.  During this time, the patient apparently got into a heated discussion with off-duty sheriff and during the heated discussion, began to have chest pressure.  The chest pressure is much more severe than when he normally experiences and lasted for 20- 30 minutes.  Once police arrived, they did not allow him to take his medicines according to the patient.  He was found to have outstanding warrant from [place] for drug charges and was arrested.  Because of his chest pain, he is brought here.  He is currently pain free.  He takes multiple narcotics and benzodiazepines and seems somewhat somnolent tonight.  He reports he does not have chronic angina and this episode tonight was new for him.  He did get very upset tonight when he was speaking to the off-duty sheriff and said the pain persisted for approximately 30 minutes.  He very rarely takes his diuretic and has remained relatively euvolemic.  His activity level is fairly low, but he does not have exertional shortness of breath currently.  He does have chronic New York Heart Association class 2 symptoms.  PAST MEDICAL HISTORY: 1. Coronary artery disease:  Previous PCI.  Last heart catheterization     in January 2012 here showed  intermediate focal lesion in the mid     LAD and totally occluded distal circumflex and total occluded ramus     vessel and nonobstructive disease in his RCA proximally, but a     subtotal occlusion of the distal RCA at the takeoff of the PDA.     His LVEF was 20%.  He had attempted PCI to the distal RCA which was     unsuccessful. 2. Ischemic cardiomyopathy.  Echocardiogram in [place] in November     2011 showed EF 25-30%.  Echo here in April 2012 showed a LVEDD 52     mm, LVEF 35-40%. 3. ICD placement in 2012. 4. Avium complex tuberculosis. 5. Obstructive sleep apnea. 6. Chronic lower back pain with lumbar stenosis. 7. Remote history of testicular cancer. 8. Prostatic hypertrophy.  SURGICAL HISTORY:  Orchiectomy, hernia repair, and sinus surgery.  ALLERGIES:  DARVOCET.  CURRENT MEDICATIONS:  Per medicine reconciliation: 1. Morphine sulfate 30 mg twice daily as needed. 2. Ativan 2 mg 3 a day. 3. Ambien 12.5 one tablet at bedtime. 4. Valium 10 mg 4 times daily. 5. Uroxatral 10 mg daily. 6. Torsemide 20 mg as needed.  He takes this approximately  once every     week. 7. Aldactone 25 mg daily. 8. Plavix 75 mg daily. 9. Sublingual nitroglycerin p.r.n. 10.Morphine sulfate extended release 60 mg every 8 hours. 11.Lisinopril 2.5 mg b.i.d. 12.Lactulose 2 tablespoons as needed. 13.Dilaudid 8 mg every 4 hours as needed. 14.Crestor 40 mg daily. 15.Aspirin two 81 mg tablets daily. 16.Carvedilol 6.25 mg 1.5 tablets b.i.d.  SOCIAL HISTORY:  He lives in [place], lives with his wife.  He has a heavy smoking history, but otherwise smokes 3-4 cigarettes per day now. He does not drink alcohol.  FAMILY HISTORY:  His father had sudden cardiac death at age 51.  REVIEW OF SYSTEMS:  Four review of systems is obtained and is negative except as stated in the HPI.  PHYSICAL EXAMINATION:  VITAL SIGNS:  Blood pressure 118/73, pulse 50, respirations 16, and temperature 98.4. GENERAL:  No acute  distress, somewhat somnolent, but arousable. HEENT:  Extraocular movements are intact.  Oropharynx is benign. Nonicteric sclerae. NECK:  Supple. CARDIOVASCULAR:  Regular rate and rhythm without murmurs, rubs, or gallops.  He has no jugular venous distention. LUNGS:  Clear to auscultation bilaterally. ABDOMEN:  Soft, nontender, and nondistended. EXTREMITIES:  No clubbing, cyanosis, or edema.  Pulses are intact throughout. NEURO:  Other than being somnolent, he is oriented and moves all extremities well.  His cranial nerves were grossly intact. SKIN:  No rashes. LYMPH NODE:  No lymphadenopathy.  EKG shows normal sinus rhythm, bradycardic with lateral T-wave abnormality.  His hemoglobin is 17.  His BUN is 13, creatinine is 0.95. His cardiac enzymes are negative x1.  Urine drug screen is pending.  His alcohol level is undetectable.  His chest x-ray is clear.  ASSESSMENT/PLAN:  [name] is a 50 year old white male with coronary artery disease and recent attempted percutaneous coronary intervention to his distal right coronary artery, ischemic cardiomyopathy with recent improvement in his left ventricular function from 35-40%, and chronic pain syndrome who is here with unstable angina after being arrested due to outstanding warrant in [place] for drug charges 1. Unstable angina:  I will rule him out for myocardial infarction.     If cardiac enzymes are negative, we will plan for medical     management.  For now, we will put him on Lovenox 1 mg/kg b.i.d.     until he is ruled out as well as aspirin and continue his Plavix.     I am unable to review his cath films and it is not clear if he has     had a previous viability study or has been offered CABG.  I suspect     by the report it seems most of his disease is distal vessel     regardless.  Much of his workup has been done at [facility] and in [place]. 2. Cardiomyopathy:  Currently, he is well perfusing, euvolemic.  We     will continue his  Coreg, ACE inhibitor, and Aldactone.  We will     hold his diuretic for now as he only takes this intermittently at     home. 3. Benzodiazepine and narcotic dependence:  Given his charges on drug,     I suspect he is not taking all the medications that are on his med     list.  In addition, he is somewhat somnolent today.  We will check     urine drug screen and plan on cutting back on his narcotics and     benzodiazepines in the hospital somewhat  as I suspect he is not     taking all of this on a regular basis.  He is accompanied by police     officer who plans to be with him until discharge at which point he     will be going to jail.     Harlon Flor, MD     MMB/MEDQ  D:  02/10/2011  T:  02/11/2011  Job:  [id number]  cc:   Bevelyn Buckles. Bensimhon, MD  Electronically Signed by Meridee Score MD on 03/04/2011 08:11:26 PM

## 2011-03-06 ENCOUNTER — Encounter: Payer: BC Managed Care – PPO | Attending: Physical Medicine & Rehabilitation

## 2011-03-06 ENCOUNTER — Ambulatory Visit (HOSPITAL_BASED_OUTPATIENT_CLINIC_OR_DEPARTMENT_OTHER): Payer: BC Managed Care – PPO | Admitting: Physical Medicine & Rehabilitation

## 2011-03-06 DIAGNOSIS — M48061 Spinal stenosis, lumbar region without neurogenic claudication: Secondary | ICD-10-CM | POA: Insufficient documentation

## 2011-03-06 DIAGNOSIS — M79609 Pain in unspecified limb: Secondary | ICD-10-CM | POA: Insufficient documentation

## 2011-03-06 DIAGNOSIS — IMO0002 Reserved for concepts with insufficient information to code with codable children: Secondary | ICD-10-CM | POA: Insufficient documentation

## 2011-03-06 DIAGNOSIS — M549 Dorsalgia, unspecified: Secondary | ICD-10-CM | POA: Insufficient documentation

## 2011-03-09 ENCOUNTER — Ambulatory Visit: Payer: BC Managed Care – PPO | Admitting: Physical Medicine and Rehabilitation

## 2011-03-09 ENCOUNTER — Ambulatory Visit (INDEPENDENT_AMBULATORY_CARE_PROVIDER_SITE_OTHER): Payer: BC Managed Care – PPO | Admitting: Psychology

## 2011-03-09 DIAGNOSIS — F4323 Adjustment disorder with mixed anxiety and depressed mood: Secondary | ICD-10-CM
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
## 2011-03-11 ENCOUNTER — Ambulatory Visit (INDEPENDENT_AMBULATORY_CARE_PROVIDER_SITE_OTHER): Payer: BC Managed Care – PPO | Admitting: Psychology

## 2011-03-11 DIAGNOSIS — F4323 Adjustment disorder with mixed anxiety and depressed mood: Secondary | ICD-10-CM

## 2011-03-17 ENCOUNTER — Ambulatory Visit: Payer: BC Managed Care – PPO | Admitting: Psychology

## 2011-03-18 ENCOUNTER — Ambulatory Visit (INDEPENDENT_AMBULATORY_CARE_PROVIDER_SITE_OTHER): Payer: BC Managed Care – PPO | Admitting: Psychology

## 2011-03-18 ENCOUNTER — Ambulatory Visit: Payer: BC Managed Care – PPO | Admitting: Psychology

## 2011-03-18 DIAGNOSIS — F4323 Adjustment disorder with mixed anxiety and depressed mood: Secondary | ICD-10-CM

## 2011-03-19 ENCOUNTER — Ambulatory Visit (INDEPENDENT_AMBULATORY_CARE_PROVIDER_SITE_OTHER): Payer: BC Managed Care – PPO | Admitting: Psychology

## 2011-03-19 DIAGNOSIS — F4323 Adjustment disorder with mixed anxiety and depressed mood: Secondary | ICD-10-CM

## 2011-03-20 ENCOUNTER — Ambulatory Visit (HOSPITAL_BASED_OUTPATIENT_CLINIC_OR_DEPARTMENT_OTHER): Payer: BC Managed Care – PPO | Admitting: Physical Medicine & Rehabilitation

## 2011-03-20 ENCOUNTER — Encounter: Payer: BC Managed Care – PPO | Attending: Physical Medicine & Rehabilitation

## 2011-03-20 DIAGNOSIS — M48061 Spinal stenosis, lumbar region without neurogenic claudication: Secondary | ICD-10-CM

## 2011-03-20 DIAGNOSIS — M79609 Pain in unspecified limb: Secondary | ICD-10-CM | POA: Insufficient documentation

## 2011-03-20 DIAGNOSIS — IMO0002 Reserved for concepts with insufficient information to code with codable children: Secondary | ICD-10-CM

## 2011-03-20 DIAGNOSIS — M549 Dorsalgia, unspecified: Secondary | ICD-10-CM | POA: Insufficient documentation

## 2011-03-20 NOTE — Assessment & Plan Note (Signed)
HISTORY:  A 50 year old male with severe lumbar spinal stenosis.  He also has ischemic cardiomyopathy, nonoperative candidate.  He was seen in initial evaluation by myself on March 06, 2011.  Lumbar imaging was reviewed, severe stenosis L4-5 greater than L5-S1, and nerve root encroachment at L5 and S1.  His pain score is 8/10, pain is described as sharp, burning, numbness as walking, which is relieved by sitting, but not by standing.  Pain is worse with walking, bending, and standing.  Relief from meds is fair. He uses a walker when he gets some numbness in his legs, this helps, also using shopping cart helps.  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  REVIEW OF SYSTEMS:  Weakness, numbness, trouble walking, anxiety.  PHYSICAL EXAMINATION:  VITAL SIGNS:  Blood pressure 124/72, pulse 73, respirations 16, and O2 sat 95% on room air. EXTREMITIES:  He has bilateral decreased Achilles reflexes.  Extremities without edema.  He has good strength in bilateral extremities. Straight leg raising test is negative.  Gait is slightly wide based. Short step length, decreased arm swing.  IMPRESSION:  Lumbar spinal stenosis with chronic radiculitis, nonsurgical candidate.  We checked his urine screen, it was appropriate for the morphine, it did show more than one benzodiazepine, he has been chronically on diazepam and per his report has been transitioning from diazepam to Ativan.  In addition, it shows some alprazolam which he states he had at home.  We discussed the results.  He states that he is supposed to be just on the diazepam and lorazepam currently.  We will recheck urine drug screen if it shows the Xanax again, we will dismiss from clinic.  The patient understands policies of the controlled substance agreement.  He will follow up with his psychiatrist, Dr. Toney Reil.     Charlett Blake, M.D. Electronically Signed    AEK/MedQ D:  03/20/2011 10:18:21   T:  03/20/2011 13:16:29  Job #:  [id number]  cc:   Shaune Pascal. [place], [place] [place] [address]  Dr. Margretta Ditty  Aldona Bar, M.D. Fax: [phone]

## 2011-03-25 ENCOUNTER — Ambulatory Visit (INDEPENDENT_AMBULATORY_CARE_PROVIDER_SITE_OTHER): Payer: BC Managed Care – PPO | Admitting: Psychology

## 2011-03-25 DIAGNOSIS — F4323 Adjustment disorder with mixed anxiety and depressed mood: Secondary | ICD-10-CM

## 2011-03-26 ENCOUNTER — Encounter: Payer: BC Managed Care – PPO | Admitting: *Deleted

## 2011-03-27 ENCOUNTER — Ambulatory Visit (HOSPITAL_COMMUNITY)
Admission: RE | Admit: 2011-03-27 | Discharge: 2011-03-27 | Disposition: A | Discharge status: Home / Self Care | Payer: BC Managed Care – PPO | Source: Ambulatory Visit | Attending: Internal Medicine | Admitting: Internal Medicine

## 2011-03-27 ENCOUNTER — Ambulatory Visit: Payer: BC Managed Care – PPO | Admitting: Psychology

## 2011-03-27 DIAGNOSIS — I513 Intracardiac thrombosis, not elsewhere classified: Secondary | ICD-10-CM

## 2011-03-27 DIAGNOSIS — Z950 Presence of cardiac pacemaker: Secondary | ICD-10-CM | POA: Insufficient documentation

## 2011-03-27 DIAGNOSIS — I714 Abdominal aortic aneurysm, without rupture, unspecified: Secondary | ICD-10-CM | POA: Insufficient documentation

## 2011-03-27 DIAGNOSIS — M47817 Spondylosis without myelopathy or radiculopathy, lumbosacral region: Secondary | ICD-10-CM | POA: Insufficient documentation

## 2011-03-27 DIAGNOSIS — K7689 Other specified diseases of liver: Secondary | ICD-10-CM | POA: Insufficient documentation

## 2011-03-27 DIAGNOSIS — M48061 Spinal stenosis, lumbar region without neurogenic claudication: Secondary | ICD-10-CM | POA: Insufficient documentation

## 2011-03-27 DIAGNOSIS — J984 Other disorders of lung: Secondary | ICD-10-CM | POA: Insufficient documentation

## 2011-03-27 DIAGNOSIS — I2510 Atherosclerotic heart disease of native coronary artery without angina pectoris: Secondary | ICD-10-CM | POA: Insufficient documentation

## 2011-03-27 DIAGNOSIS — I708 Atherosclerosis of other arteries: Secondary | ICD-10-CM | POA: Insufficient documentation

## 2011-03-27 DIAGNOSIS — N62 Hypertrophy of breast: Secondary | ICD-10-CM | POA: Insufficient documentation

## 2011-03-27 DIAGNOSIS — I369 Nonrheumatic tricuspid valve disorder, unspecified: Secondary | ICD-10-CM

## 2011-03-27 MED ORDER — IOHEXOL 300 MG/ML  SOLN
100.0000 mL | Freq: Once | INTRAMUSCULAR | Status: AC | PRN
  Administered 2011-03-27: 100 mL via INTRAVENOUS

## 2011-03-30 ENCOUNTER — Encounter: Payer: Self-pay | Admitting: *Deleted

## 2011-03-31 ENCOUNTER — Other Ambulatory Visit (HOSPITAL_COMMUNITY): Payer: BC Managed Care – PPO

## 2011-04-03 ENCOUNTER — Encounter (INDEPENDENT_AMBULATORY_CARE_PROVIDER_SITE_OTHER): Payer: BC Managed Care – PPO | Admitting: *Deleted

## 2011-04-03 ENCOUNTER — Encounter: Payer: Self-pay | Admitting: Internal Medicine

## 2011-04-03 DIAGNOSIS — I428 Other cardiomyopathies: Secondary | ICD-10-CM

## 2011-04-06 ENCOUNTER — Ambulatory Visit (INDEPENDENT_AMBULATORY_CARE_PROVIDER_SITE_OTHER): Payer: BC Managed Care – PPO | Admitting: Psychology

## 2011-04-06 ENCOUNTER — Ambulatory Visit (HOSPITAL_COMMUNITY)
Admission: RE | Admit: 2011-04-06 | Discharge: 2011-04-06 | Disposition: A | Discharge status: Home / Self Care | Payer: BC Managed Care – PPO | Source: Ambulatory Visit | Attending: Internal Medicine | Admitting: Internal Medicine

## 2011-04-06 ENCOUNTER — Encounter (HOSPITAL_COMMUNITY): Payer: Self-pay

## 2011-04-06 DIAGNOSIS — F4323 Adjustment disorder with mixed anxiety and depressed mood: Secondary | ICD-10-CM

## 2011-04-06 DIAGNOSIS — I5042 Chronic combined systolic (congestive) and diastolic (congestive) heart failure: Secondary | ICD-10-CM | POA: Insufficient documentation

## 2011-04-06 DIAGNOSIS — I2510 Atherosclerotic heart disease of native coronary artery without angina pectoris: Secondary | ICD-10-CM

## 2011-04-06 MED ORDER — LISINOPRIL 10 MG PO TABS: 10.0000 mg | ORAL_TABLET | Freq: Two times a day (BID) | ORAL | Status: DC

## 2011-04-06 MED ORDER — CARVEDILOL 12.5 MG PO TABS: 12.5000 mg | ORAL_TABLET | Freq: Two times a day (BID) | ORAL | Status: DC

## 2011-04-06 MED ORDER — ROSUVASTATIN CALCIUM 40 MG PO TABS: 40.0000 mg | ORAL_TABLET | Freq: Every day | ORAL | Status: DC

## 2011-04-06 MED ORDER — CLOPIDOGREL BISULFATE 75 MG PO TABS: 75.0000 mg | ORAL_TABLET | Freq: Every day | ORAL | Status: DC

## 2011-04-06 NOTE — Patient Instructions (Signed)
Increase Carvedilol 12.5 mg po bid  Please contintinue to work on smoking cessation  Follow up in one month

## 2011-04-06 NOTE — Progress Notes (Signed)
HPI:  [name] is a 50 year old man with a history of polysubstance use,  obesity, OSA, depression, chronic back pain on high-dose narcotics and severe coronary artery disease complicated by an ischemic cardiomyopathy with an EF in a 25% range.  Last cath 2011 showed:  LM: ostial 20% LAD: Diffuse  40% prox, mid 60-70% focal lesion.  D1: 30% tubular lesion. LCX: gave off a ramus branch, small OM-1.  The distal AV groove circ was subtotally occluded which was chronic.  In the ramus branch, there was evidence of a previously placed stent.  This was now totally occluded.  In the OM-1, there was a 40% proximal lesion. RCA:  dominant vessel, had diffuse 40-50% disease throughout.  In the distal RCA, prior to the PDA, there was a tubular 40% lesion.  In the distal RCA just after the takeoff of the PDA, there was a 99% lesion with a near subtotal occlusion.  In the PDA, there was a 5 0% tubular lesion distally.  LV 20%.  Failed PCI of distal RCA.  Underwent ICD implantation. Now enrolled in [employer].   No SOB/CP. Sleeps with head of bed elevated. He will only take or demadex spironolactone if legs swell. He has been on antibiotics for upper respiratory infection.    Admitted 2 weeks ago with atypical CP. CE normal. Myoview suggested but he refused.  HR was in 48s. Was told cut Coreg back but he kept it at the same dose.  Says he feels much better after he was placed on neurontin by [facility] Cheree Ditto). Eventually going to be treated with suboxone. Can walk a couple hundred yards before his legs bother him. Denies CP or dyspnea. No edema, orthopnea or PND. Not weighing every day because he doesn't have a scale. Not watching his diet.  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 Wants to know what to do about it.  Denies SOB/CP He does not weigh daily but he thinks his weight is up.  Uses stationary bike 3 times a week. He continues to smoke 2-3  cigarettes a day. Sleeps with head of bed elevated. He will only take demadex or spironolactone if legs swell. He has been on antibiotics (Cleocin, Augmetin, and Levaquin) for upper respiratory infection. Followed by Dr Letta Pate for back pain.    Echo reviewed from earlier this month (9/12) EF 35-40%. Mild MR.    ROS: All systems negative except as listed in HPI, PMH and Problem List.  Past Medical History  Diagnosis Date  . Chronic systolic heart failure     EF 20-25%. s/p ST. Jude ICD  . CAD (coronary artery disease)     Last cath 2/12. 3-v CAD. Failed PCI of distal RCA  . Chronic back pain     lumbar stenosis  . Benign prostatic hypertrophy   . Depression   . History of testicular cancer   . Narcotic dependence     chronic  . Benzodiazepine dependence     chronic  . Anxiety   . CHF (congestive heart failure)     Current Outpatient Prescriptions  Medication Sig Dispense Refill  . alfuzosin (UROXATRAL) 10 MG 24 hr tablet Take 1 tablet (10 mg total) by mouth daily. As needed  30 tablet  2  . aspirin 81 MG tablet Take 81 mg by mouth daily.        . carvedilol (COREG) 6.25 MG tablet take 1 and  1/2 tab twice a day      . citalopram (CELEXA) 20 MG tablet Take 1 tablet (20 mg total) by mouth daily.  30 tablet  11  . clopidogrel (PLAVIX) 75 MG tablet Take 75 mg by mouth daily.        . diazepam (VALIUM) 10 MG tablet Take 1 tablet (10 mg total) by mouth 4 (four) times daily.  60 tablet  0  . gabapentin (NEURONTIN) 600 MG tablet Take 600 mg by mouth 2 (two) times daily.        Marland Kitchen lactulose (CHRONULAC) 10 GM/15ML solution Take 30 mLs (20 g total) by mouth as needed.  240 mL  1  . lisinopril (PRINIVIL,ZESTRIL) 10 MG tablet Take 1 tablet (10 mg total) by mouth 2 (two) times daily.  60 tablet  3  . morphine (MS CONTIN) 200 MG 12 hr tablet Take 200 mg by mouth 2 (two) times daily.        Marland Kitchen morphine (MS CONTIN) 60 MG 12 hr tablet Take 60 mg by mouth 2 (two) times daily.        .  nitroGLYCERIN (NITROSTAT) 0.4 MG SL tablet Place 0.4 mg under the tongue every 5 (five) minutes as needed.        . rosuvastatin (CRESTOR) 40 MG tablet Take 40 mg by mouth daily.        Marland Kitchen spironolactone (ALDACTONE) 25 MG tablet Take 25 mg by mouth daily.        Marland Kitchen testosterone cypionate (DEPOTESTOTERONE CYPIONATE) 200 MG/ML injection Inject into the muscle every 14 (fourteen) days.        Marland Kitchen zolpidem (AMBIEN CR) 12.5 MG CR tablet Take 12.5 mg by mouth at bedtime as needed.          PHYSICAL EXAM: Filed Vitals:   04/06/11 1112  BP: 128/74  Pulse: 86   General:  Well appearing. No resp difficulty HEENT: normal Neck: supple. JVP flat. Carotids 2+ bilaterally; no bruits. No lymphadenopathy or thryomegaly appreciated. Cor: PMI normal. Regular rate & rhythm. No rubs, gallops or murmurs. Lungs: Coarse through out Abdomen: soft, nontender, nondistended. No hepatosplenomegaly. No bruits or masses. Good bowel sounds. Extremities: no cyanosis, clubbing, rash, edema Neuro: alert & oriented x 3, cranial nerves grossly intact. Moves all 4 extremities w/o difficulty. Affect pleasant.    ASSESSMENT & PLAN:

## 2011-04-06 NOTE — Assessment & Plan Note (Addendum)
NYHA II. Volume status stable. Reviewed echo with him. EF 35-40%. Increase Carvedilol  to12.51m twice daily. Continue to weigh daily.  Follow up in one month.   Patient seen and examined with ADarrick Grinder NP. We discussed all aspects of the encounter. I agree with the assessment and plan as stated above.

## 2011-04-06 NOTE — Assessment & Plan Note (Addendum)
No evidence of ischemia. Continue current regimen. Emphasized need for diet and exercise.

## 2011-04-08 NOTE — Telephone Encounter (Signed)
This encounter was created in error - please disregard.

## 2011-04-14 ENCOUNTER — Ambulatory Visit: Payer: BC Managed Care – PPO | Admitting: Physical Medicine & Rehabilitation

## 2011-04-17 ENCOUNTER — Encounter: Payer: BC Managed Care – PPO | Attending: Physical Medicine & Rehabilitation

## 2011-04-17 ENCOUNTER — Ambulatory Visit (HOSPITAL_BASED_OUTPATIENT_CLINIC_OR_DEPARTMENT_OTHER): Payer: BC Managed Care – PPO | Admitting: Physical Medicine & Rehabilitation

## 2011-04-17 DIAGNOSIS — M549 Dorsalgia, unspecified: Secondary | ICD-10-CM | POA: Insufficient documentation

## 2011-04-17 DIAGNOSIS — M48061 Spinal stenosis, lumbar region without neurogenic claudication: Secondary | ICD-10-CM

## 2011-04-17 DIAGNOSIS — IMO0002 Reserved for concepts with insufficient information to code with codable children: Secondary | ICD-10-CM | POA: Insufficient documentation

## 2011-04-17 DIAGNOSIS — M79609 Pain in unspecified limb: Secondary | ICD-10-CM | POA: Insufficient documentation

## 2011-04-17 NOTE — Assessment & Plan Note (Signed)
REASON FOR VISIT:  Chronic back pain and lower extremity pain.  HISTORY:  The patient is a 50 year old male with history of lumbar stenosis, onset greater than 5 years ago.  Multiple MRIs and CT scans have been performed, severe stenosis L4-5, large disk protrusion, small protrusionL5-S1 and stenosis L3-4 as well.  He does have foraminal narrowing right L4-5 and another MRI done in 2011.  Unfortunately, due to his severe ischemic cardiomyopathy, he is not a candidate for surgical treatment.  He has been managed with narcotic analgesics.  His ejection fraction as noted by [facility] somewhere in the 20- 25% range on cath, but around 35% on echo.  FUNCTIONAL STATUS:  He is able to ambulate, but needs a walker.  His walking tolerance is less than 200 feet.  He does not get any chest pain when he walks.  It is really more due to leg pain.  He gets relief with leaning over a shopping cart or on his walker or sitting.  He has not gone through any physical therapy.  Functional needs some help with dressing and bathing and certain household duties, otherwise independent.  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  REVIEW OF SYSTEMS:  Weakness, numbness, trouble walking, spasms, anxiety, constipation, urine retention and limb swelling.  SOCIAL HISTORY:  Married, lives with his wife and 45-year-old son.  Blood pressure 139/92, pulse 118, respirations 18 and O2 sat 97% on room air.  General, no acute distress.  Mood and affect appropriate.  His lower extremity strength is normal.  Negative straight leg raising test. He is able to ambulate with a walker forward flexed posture.  He is able to get up and down the step to the exam table.  His mood and affect are appropriate.  Mental status is alert.  IMPRESSION: 1. Severe lumbar stenosis primarily at L4-5 level causing neurogenic     claudication. 2. Cardiomyopathy.  I do think that he would be able to tolerate     physical  therapy from a cardiac standpoint. 3. Narcotic analgesics.  We will monitor with a urine drug screen     today because he is going out of town, we will need to fill his     morphine couple days early.  His pill count was on target for the     long-acting morphine and 1 day short on a short acting.  Discussed with the patient, agrees with plan.  I will see him back in 1 month.  We will increase his gabapentin to 800 q.i.d.     Charlett Blake, M.D. Electronically Signed   AEK/MedQ D:  04/17/2011 11:38:50  T:  04/17/2011 14:09:55  Job #:  [id number]

## 2011-05-12 ENCOUNTER — Ambulatory Visit (HOSPITAL_COMMUNITY)
Admission: RE | Admit: 2011-05-12 | Discharge: 2011-05-12 | Disposition: A | Discharge status: Home / Self Care | Payer: BC Managed Care – PPO | Source: Ambulatory Visit | Attending: Internal Medicine | Admitting: Internal Medicine

## 2011-05-12 VITALS — BP 106/76 | HR 100 | Wt 243.8 lb

## 2011-05-12 DIAGNOSIS — R42 Dizziness and giddiness: Secondary | ICD-10-CM | POA: Insufficient documentation

## 2011-05-12 DIAGNOSIS — I5022 Chronic systolic (congestive) heart failure: Secondary | ICD-10-CM | POA: Insufficient documentation

## 2011-05-12 DIAGNOSIS — I509 Heart failure, unspecified: Secondary | ICD-10-CM | POA: Insufficient documentation

## 2011-05-12 DIAGNOSIS — Z91199 Patient's noncompliance with other medical treatment and regimen due to unspecified reason: Secondary | ICD-10-CM | POA: Insufficient documentation

## 2011-05-12 DIAGNOSIS — F411 Generalized anxiety disorder: Secondary | ICD-10-CM | POA: Insufficient documentation

## 2011-05-12 DIAGNOSIS — F329 Major depressive disorder, single episode, unspecified: Secondary | ICD-10-CM | POA: Insufficient documentation

## 2011-05-12 DIAGNOSIS — I2510 Atherosclerotic heart disease of native coronary artery without angina pectoris: Secondary | ICD-10-CM | POA: Insufficient documentation

## 2011-05-12 DIAGNOSIS — Z9119 Patient's noncompliance with other medical treatment and regimen: Secondary | ICD-10-CM | POA: Insufficient documentation

## 2011-05-12 DIAGNOSIS — G8929 Other chronic pain: Secondary | ICD-10-CM | POA: Insufficient documentation

## 2011-05-12 DIAGNOSIS — R0609 Other forms of dyspnea: Secondary | ICD-10-CM | POA: Insufficient documentation

## 2011-05-12 DIAGNOSIS — Z79899 Other long term (current) drug therapy: Secondary | ICD-10-CM | POA: Insufficient documentation

## 2011-05-12 DIAGNOSIS — F3289 Other specified depressive episodes: Secondary | ICD-10-CM | POA: Insufficient documentation

## 2011-05-12 DIAGNOSIS — R0989 Other specified symptoms and signs involving the circulatory and respiratory systems: Secondary | ICD-10-CM | POA: Insufficient documentation

## 2011-05-12 DIAGNOSIS — F1320 Sedative, hypnotic or anxiolytic dependence, uncomplicated: Secondary | ICD-10-CM | POA: Insufficient documentation

## 2011-05-12 DIAGNOSIS — I2589 Other forms of chronic ischemic heart disease: Secondary | ICD-10-CM

## 2011-05-12 DIAGNOSIS — F1120 Opioid dependence, uncomplicated: Secondary | ICD-10-CM | POA: Insufficient documentation

## 2011-05-12 DIAGNOSIS — M549 Dorsalgia, unspecified: Secondary | ICD-10-CM | POA: Insufficient documentation

## 2011-05-12 DIAGNOSIS — Z7982 Long term (current) use of aspirin: Secondary | ICD-10-CM | POA: Insufficient documentation

## 2011-05-12 NOTE — Assessment & Plan Note (Addendum)
NYHA II-III. Volume status looks OK. Long talk about need for compliance with meds. Resume current regimen.

## 2011-05-12 NOTE — Patient Instructions (Addendum)
Weigh yourself every day after you wake up and go to the bathroom. Torsemide ( Demadex) is your water pill.  Take torsemide each day after you weigh yourself if your weight is up 3 lbs from the day before.  Lisinopril is an Ace inhibitor that helps your heart pump strong and lowers your blood pressure. Coreg (carvedilol) is a Beta-blocker that slows your heart rate and lowers your blood pressure and helps your heart pump stronger. Spironolactone helps your heart pump stronger and helps your water pill work better.   Take your blood pressure when you feel dizzy and bring those readings and your weight chart in to the next visit.  Your physician has recommended that you have a cardiopulmonary stress test (CPX). CPX testing is a non-invasive measurement of heart and lung function. It replaces a traditional treadmill stress test. This type of test provides a tremendous amount of information that relates not only to your present condition but also for future outcomes. This test combines measurements of you ventilation, respiratory gas exchange in the lungs, electrocardiogram (EKG), blood pressure and physical response before, during, and following an exercise protocol.  Your physician recommends that you schedule a follow-up appointment in: 1 month

## 2011-05-12 NOTE — Progress Notes (Signed)
HPI:  [name] is a 50 year old man with a history of polysubstance use,  obesity, OSA, depression, chronic back pain on high-dose narcotics and severe coronary artery disease complicated by an ischemic cardiomyopathy/heart failure with an EF in a 25% range.  Last cath 2011 showed:  LM: ostial 20% LAD: Diffuse  40% prox, mid 60-70% focal lesion.  D1: 30% tubular lesion. LCX: gave off a ramus branch, small OM-1.  The distal AV groove circ was subtotally occluded which was chronic.  In the ramus branch, there was evidence of a previously placed stent.  This was now totally occluded.  In the OM-1, there was a 40% proximal lesion. RCA:  dominant vessel, had diffuse 40-50% disease throughout.  In the distal RCA, prior to the PDA, there was a tubular 40% lesion.  In the distal RCA just after the takeoff of the PDA, there was a 99% lesion with a near subtotal occlusion.  In the PDA, there was a 5 0% tubular lesion distally. LV 20%.  Failed PCI of distal RCA.  Underwent ICD implantation. Now enrolled in [employer].   Echo 04/06/11; EF 35-40% mild RV hypokinesis. Mild MR.  Returns for routine f/u today. Says he feels pretty good. Has lost about 10 pounds. Not compliant with meds. Not taking spiro as he says he doesn't feel like it is helping him. Not checking weights daily. Doesn't feel very well. Says when he takes all his medications he feels fatigued and gets dizzy. No edema, orthopnea or PND. If dizzy during the day will often hold night meds. Not taking BP. Riding exercise bike occasionally can go 8-10 miles. Mild DOE. Taking torsemide 64m 2-3 per week when ankles swell.   ROS: All systems negative except as listed in HPI, PMH and Problem List.  Past Medical History  Diagnosis Date  . Chronic systolic heart failure     EF 20-25%. s/p ST. Jude ICD  . CAD (coronary artery disease)     Last cath 2/12. 3-v CAD. Failed PCI of distal RCA  . Chronic back pain     lumbar stenosis  . Benign prostatic  hypertrophy   . Depression   . History of testicular cancer   . Narcotic dependence     chronic  . Benzodiazepine dependence     chronic  . Anxiety   . CHF (congestive heart failure)     Current Outpatient Prescriptions  Medication Sig Dispense Refill  . aspirin 81 MG tablet Take 81 mg by mouth daily.        . carvedilol (COREG) 12.5 MG tablet Take 1 tablet (12.5 mg total) by mouth 2 (two) times daily with a meal.  60 tablet  6  . clopidogrel (PLAVIX) 75 MG tablet Take 1 tablet (75 mg total) by mouth daily.  30 tablet  6  . diazepam (VALIUM) 10 MG tablet Take 10 mg by mouth 2 (two) times daily.        .Marland Kitchengabapentin (NEURONTIN) 600 MG tablet Take 800 mg by mouth. 5 times a day      . lactulose (CHRONULAC) 10 GM/15ML solution Take 30 mLs (20 g total) by mouth as needed.  240 mL  1  . lisinopril (PRINIVIL,ZESTRIL) 10 MG tablet Take 1 tablet (10 mg total) by mouth 2 (two) times daily.  60 tablet  3  . LORazepam (ATIVAN) 2 MG tablet Take 2 mg by mouth every 6 (six) hours as needed.        .Marland Kitchenmorphine (MS  CONTIN) 200 MG 12 hr tablet Take 200 mg by mouth every 8 (eight) hours.       Marland Kitchen morphine (MSIR) 30 MG tablet Take 30 mg by mouth every 6 (six) hours as needed.        . nitroGLYCERIN (NITROSTAT) 0.4 MG SL tablet Place 0.4 mg under the tongue every 5 (five) minutes as needed.       . rosuvastatin (CRESTOR) 40 MG tablet Take 1 tablet (40 mg total) by mouth daily.  30 tablet  6  . testosterone cypionate (DEPOTESTOTERONE CYPIONATE) 200 MG/ML injection Inject into the muscle every 14 (fourteen) days.        Marland Kitchen torsemide (DEMADEX) 20 MG tablet Take 20 mg by mouth daily.        Marland Kitchen zolpidem (AMBIEN CR) 12.5 MG CR tablet Take 12.5 mg by mouth at bedtime as needed.        Marland Kitchen alfuzosin (UROXATRAL) 10 MG 24 hr tablet Take 1 tablet (10 mg total) by mouth daily. As needed  30 tablet  2  . citalopram (CELEXA) 20 MG tablet Take 1 tablet (20 mg total) by mouth daily.  30 tablet  11  . spironolactone (ALDACTONE)  25 MG tablet Take 25 mg by mouth daily.          PHYSICAL EXAM: Filed Vitals:   05/12/11 1149  BP: 106/76  Pulse: 100   General:  Well appearing. No resp difficulty HEENT: normal Neck: supple. JVP flat. Carotids 2+ bilaterally; no bruits. No lymphadenopathy or thryomegaly appreciated. Cor: PMI normal. Regular rate & rhythm. No rubs, gallops or murmurs. Lungs: Coarse through out Abdomen: soft, nontender, nondistended. No hepatosplenomegaly. No bruits or masses. Good bowel sounds. Extremities: no cyanosis, clubbing, rash, edema Neuro: alert & oriented x 3, cranial nerves grossly intact. Moves all 4 extremities w/o difficulty. Affect pleasant.    ASSESSMENT & PLAN:

## 2011-05-13 NOTE — Assessment & Plan Note (Signed)
No evidence of ischemia. Continue current regimen.

## 2011-05-19 ENCOUNTER — Encounter: Payer: BC Managed Care – PPO | Attending: Physical Medicine & Rehabilitation

## 2011-05-19 ENCOUNTER — Ambulatory Visit (HOSPITAL_BASED_OUTPATIENT_CLINIC_OR_DEPARTMENT_OTHER): Payer: BC Managed Care – PPO | Admitting: Physical Medicine & Rehabilitation

## 2011-05-19 ENCOUNTER — Ambulatory Visit: Payer: BC Managed Care – PPO | Admitting: Physical Medicine & Rehabilitation

## 2011-05-19 DIAGNOSIS — M48061 Spinal stenosis, lumbar region without neurogenic claudication: Secondary | ICD-10-CM | POA: Insufficient documentation

## 2011-05-19 DIAGNOSIS — M79609 Pain in unspecified limb: Secondary | ICD-10-CM | POA: Insufficient documentation

## 2011-05-19 DIAGNOSIS — M549 Dorsalgia, unspecified: Secondary | ICD-10-CM | POA: Insufficient documentation

## 2011-05-19 DIAGNOSIS — IMO0002 Reserved for concepts with insufficient information to code with codable children: Secondary | ICD-10-CM | POA: Insufficient documentation

## 2011-05-19 NOTE — Assessment & Plan Note (Signed)
HISTORY:  A 50 year old male with history of lumbar spinal stenosis, none operative due to congestive heart failure.  He states that he is going to the outpatient heart transplant program at [facility] to be evaluated.  He states he has back pain that is worse with walking and standing, improves with leaning over.  The pain is improved with rest and medications.  He can walk 2-3 minutes at a time.  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  He needs assist with dressing, household duties, shopping.  REVIEW OF SYSTEMS:  Positive for bladder control problems, numbness, walking problems mainly in the lower extremities.  He has had a fall, scraping his forehead.  PAST MEDICAL HISTORY:  Also significant for testicular cancer and is on hormone replacement.  SOCIAL HISTORY:  Married, lives with his wife and 50-year-old son.  PHYSICAL EXAMINATION:  VITAL SIGNS:  Blood pressure 113/72, pulse 105, respirations 18, O2 saturation 95% on room air, weight 238 pounds, height 6 feet 1 inch. Overweight male, in no acute distress.  Orientation x3.  Mood and affect are appropriate, although it looks tired.  His back has a full forward flexion, extension is to neutral.  He has no tenderness to palpation lumbar paraspinals, lower extremity strength is good, deep tendon reflexes are reduced bilateral knees and ankles.  Hip knee and ankle range of motion are normal.  IMPRESSION: 1. Lumbar spinal stenosis.  His walking tolerance is reduced.  He also     has falls I think he would benefit from outpatient physical     therapy.  I really do not think any issue with his cardiac status     in regards to his ability to complete his physical therapy program. 2. Congestive heart failure.  He will follow up with Dr. Haroldine Laws. 3. The patient is interested in pursuing disability.  I would think he     can qualify on the basis of this heart disease alone but also in     combination with his  lumbar stenosis.     Charlett Blake, M.D. Electronically Signed    AEK/MedQ D:  05/19/2011 14:17:04  T:  05/19/2011 15:26:20  Job #:  [id number]

## 2011-05-20 ENCOUNTER — Ambulatory Visit (HOSPITAL_COMMUNITY): Payer: BC Managed Care – PPO

## 2011-05-22 DIAGNOSIS — C6290 Malignant neoplasm of unspecified testis, unspecified whether descended or undescended: Secondary | ICD-10-CM | POA: Insufficient documentation

## 2011-05-28 ENCOUNTER — Ambulatory Visit: Payer: BC Managed Care – PPO | Admitting: Psychology

## 2011-06-08 ENCOUNTER — Ambulatory Visit (HOSPITAL_COMMUNITY): Payer: BC Managed Care – PPO

## 2011-06-08 ENCOUNTER — Ambulatory Visit (INDEPENDENT_AMBULATORY_CARE_PROVIDER_SITE_OTHER): Payer: BC Managed Care – PPO | Admitting: Psychology

## 2011-06-08 DIAGNOSIS — F4323 Adjustment disorder with mixed anxiety and depressed mood: Secondary | ICD-10-CM

## 2011-06-10 ENCOUNTER — Telehealth: Payer: Self-pay | Admitting: Internal Medicine

## 2011-06-10 ENCOUNTER — Encounter (HOSPITAL_COMMUNITY): Payer: BC Managed Care – PPO

## 2011-06-11 ENCOUNTER — Encounter: Payer: Self-pay | Admitting: Internal Medicine

## 2011-06-11 ENCOUNTER — Ambulatory Visit (INDEPENDENT_AMBULATORY_CARE_PROVIDER_SITE_OTHER): Payer: BC Managed Care – PPO | Admitting: *Deleted

## 2011-06-11 ENCOUNTER — Ambulatory Visit (HOSPITAL_COMMUNITY)
Admission: RE | Admit: 2011-06-11 | Discharge: 2011-06-11 | Disposition: A | Discharge status: Home / Self Care | Payer: BC Managed Care – PPO | Source: Ambulatory Visit | Attending: Internal Medicine | Admitting: Internal Medicine

## 2011-06-11 ENCOUNTER — Other Ambulatory Visit: Payer: Self-pay

## 2011-06-11 DIAGNOSIS — I5042 Chronic combined systolic (congestive) and diastolic (congestive) heart failure: Secondary | ICD-10-CM

## 2011-06-11 DIAGNOSIS — K5900 Constipation, unspecified: Secondary | ICD-10-CM | POA: Insufficient documentation

## 2011-06-11 DIAGNOSIS — I2589 Other forms of chronic ischemic heart disease: Secondary | ICD-10-CM

## 2011-06-11 DIAGNOSIS — Z8547 Personal history of malignant neoplasm of testis: Secondary | ICD-10-CM | POA: Insufficient documentation

## 2011-06-11 DIAGNOSIS — I700 Atherosclerosis of aorta: Secondary | ICD-10-CM

## 2011-06-11 LAB — ICD DEVICE OBSERVATION
AL AMPLITUDE: 2.6 mv
AL IMPEDENCE ICD: 412.5 Ohm
AL THRESHOLD: 0.375 V
ATRIAL PACING ICD: 0.13 pct
BAMS-0001: 180 {beats}/min
BAMS-0003: 70 {beats}/min
DEV-0020ICD: NEGATIVE
DEVICE MODEL ICD: 815096
FVT: 0
HV IMPEDENCE: 66 Ohm
MODE SWITCH EPISODES: 0
PACEART VT: 0
RV LEAD AMPLITUDE: 12 mv
RV LEAD IMPEDENCE ICD: 337.5 Ohm
RV LEAD THRESHOLD: 1.125 V
TOT-0006: 20120201000000
TOT-0007: 2
TOT-0008: 0
TOT-0009: 1
TOT-0010: 4
TZAT-0001FASTVT: 1
TZAT-0004FASTVT: 8
TZAT-0012FASTVT: 200 ms
TZAT-0013FASTVT: 3
TZAT-0018FASTVT: NEGATIVE
TZAT-0019FASTVT: 7.5 V
TZAT-0020FASTVT: 1 ms
TZON-0003FASTVT: 340 ms
TZON-0003SLOWVT: 400 ms
TZON-0004FASTVT: 35
TZON-0004SLOWVT: 35
TZON-0005FASTVT: 6
TZON-0005SLOWVT: 6
TZON-0010FASTVT: 40 ms
TZON-0010SLOWVT: 40 ms
TZST-0001FASTVT: 2
TZST-0001FASTVT: 3
TZST-0001FASTVT: 4
TZST-0003FASTVT: 30 J
TZST-0003FASTVT: 40 J
TZST-0003FASTVT: 40 J
VENTRICULAR PACING ICD: 0.22 pct
VF: 0

## 2011-06-11 LAB — BASIC METABOLIC PANEL
BUN: 12 mg/dL (ref 6–23)
CO2: 30 mEq/L (ref 19–32)
Calcium: 9.3 mg/dL (ref 8.4–10.5)
Chloride: 103 mEq/L (ref 96–112)
Creatinine, Ser: 0.96 mg/dL (ref 0.50–1.35)
GFR calc Af Amer: >90 mL/min (ref >90)
GFR calc non Af Amer: >90 mL/min (ref >90)
Glucose, Bld: 109 mg/dL — ABNORMAL HIGH (ref 70–99)
Potassium: 5.2 mEq/L — ABNORMAL HIGH (ref 3.5–5.1)
Sodium: 142 mEq/L (ref 135–145)

## 2011-06-11 LAB — PRO B NATRIURETIC PEPTIDE: Pro B Natriuretic peptide (BNP): 146.3 pg/mL — ABNORMAL HIGH (ref 0–125)

## 2011-06-11 LAB — PSA: PSA: 0.4 ng/mL (ref <=4.00)

## 2011-06-11 MED ORDER — CARVEDILOL 12.5 MG PO TABS: 18.7500 mg | ORAL_TABLET | Freq: Two times a day (BID) | ORAL | Status: DC

## 2011-06-11 MED ORDER — LACTULOSE 10 GM/15ML PO SOLN: 20.0000 g | ORAL | Status: DC | PRN

## 2011-06-11 NOTE — Patient Instructions (Signed)
Increase Carvedilol to 1 & 1/2 tabs Twice daily   Labs today   Your physician recommends that you schedule a follow-up appointment in: 3 months

## 2011-06-11 NOTE — Progress Notes (Signed)
ICD check by industry for research 

## 2011-06-12 NOTE — Progress Notes (Signed)
HPI:  [name] is a 50 year old man with a history of polysubstance use,  obesity, OSA, depression, chronic back pain on high-dose narcotics and severe coronary artery disease complicated by an ischemic cardiomyopathy/heart failure with an EF in the 25-35% range.  Last cath 2011 showed:  LVEF 20% LM: ostial 20% LAD: Diffuse  40% prox, mid 60-70% focal lesion.  D1: 30% tubular lesion. LCX: gave off a ramus branch, small OM-1.  The distal AV groove circ was subtotally occluded which was chronic.  In the ramus branch, there was evidence of a previously placed stent.  This was now totally occluded.  In the OM-1, there was a 40% proximal lesion. RCA:  dominant vessel, had diffuse 40-50% disease throughout.  In the distal RCA, prior to the PDA, there was a tubular 40% lesion.  In the distal RCA just after the takeoff of the PDA, there was a 99% lesion with a near subtotal occlusion.  In the PDA, there was a 5 0% tubular lesion distally. Failed PCI of distal RCA. Anatomy not favorable for CABG.  He is s/p St. Jude  ICD implantation. Now enrolled in [employer]. Device checked today and was functioning properly with no VT.  Most recent Echo 04/06/11; EF 35-40% mild RV hypokinesis. Mild MR.  Last week attempted to do CPX on bike but said it was very difficult for him due to his back and was upset that we didn't have a recumbent bike. Unfortunately metabolic cart malfunctioned during test and we do not have any useable data from the test. He refused to reschedule the test due to back pain.   He presents for f/u. Says he feels bad mostly due to his severe back pain. Legs and perineum now getting numb. Has gained 20 pounds over past few months but insists he is eating only salads and can't understand why weight going up. Denies significant dyspnea says main limitation is his back. No orthopnea, PND or edema. Reports compliance with meds.   Appears very groggy today and I questioned him as to his narcotic use. He  said he has actually cut this way down from previous 2767m day of morphine dose equivalents (was on high-dose dilaudid around the clock). Now down to 3073mmorphine per day. Urine drug screens repeatedly + for THC.   ROS: All systems negative except as listed in HPI, PMH and Problem List.  Past Medical History  Diagnosis Date  . Chronic systolic heart failure     EF 20-25%. s/p ST. Jude ICD  . CAD (coronary artery disease)     Last cath 2/12. 3-v CAD. Failed PCI of distal RCA  . Chronic back pain     lumbar stenosis  . Benign prostatic hypertrophy   . Depression   . History of testicular cancer   . Narcotic dependence     chronic  . Benzodiazepine dependence     chronic  . Anxiety   . CHF (congestive heart failure)     Current Outpatient Prescriptions  Medication Sig Dispense Refill  . alfuzosin (UROXATRAL) 10 MG 24 hr tablet Take 1 tablet (10 mg total) by mouth daily. As needed  30 tablet  2  . aspirin 81 MG tablet Take 81 mg by mouth daily.        . carvedilol (COREG) 12.5 MG tablet Take 1.5 tablets (18.75 mg total) by mouth 2 (two) times daily with a meal.  90 tablet  6  . clopidogrel (PLAVIX) 75 MG tablet Take 1 tablet (  75 mg total) by mouth daily.  30 tablet  6  . diazepam (VALIUM) 10 MG tablet Take 10 mg by mouth 2 (two) times daily.        Marland Kitchen gabapentin (NEURONTIN) 600 MG tablet Take 800 mg by mouth 4 (four) times daily. 5 times a day      . lactulose (CHRONULAC) 10 GM/15ML solution Take 30 mLs (20 g total) by mouth as needed.  240 mL  0  . lisinopril (PRINIVIL,ZESTRIL) 10 MG tablet Take 1 tablet (10 mg total) by mouth 2 (two) times daily.  60 tablet  3  . LORazepam (ATIVAN) 2 MG tablet Take 2 mg by mouth every 6 (six) hours as needed.        Marland Kitchen morphine (MS CONTIN) 200 MG 12 hr tablet Take 200 mg by mouth every 8 (eight) hours.       Marland Kitchen morphine (MSIR) 30 MG tablet Take 30 mg by mouth every 6 (six) hours as needed.        . nitroGLYCERIN (NITROSTAT) 0.4 MG SL tablet Place  0.4 mg under the tongue every 5 (five) minutes as needed.       . rosuvastatin (CRESTOR) 40 MG tablet Take 1 tablet (40 mg total) by mouth daily.  30 tablet  6  . spironolactone (ALDACTONE) 25 MG tablet Take 25 mg by mouth daily.        Marland Kitchen testosterone cypionate (DEPOTESTOTERONE CYPIONATE) 200 MG/ML injection Inject into the muscle every 14 (fourteen) days.        Marland Kitchen torsemide (DEMADEX) 20 MG tablet Take 20 mg by mouth daily as needed.       . zolpidem (AMBIEN CR) 12.5 MG CR tablet Take 12.5 mg by mouth at bedtime as needed.          PHYSICAL EXAM: Filed Vitals:   06/11/11 1301  BP: 118/78  Pulse: 81   Vitals - 1 value per visit 06/11/2011 05/12/2011 04/06/2011 7/67/3419  SYSTOLIC 379 024 097 353  DIASTOLIC 78 76 74 78  PULSE 81 100 86 103  TEMPERATURE      RESPIRATIONS      Weight (lb) 253.4 243.75 251 236  HEIGHT    6' 1"   BMI 33.44 32.17 33.12 31.14   Vitals - 1 value per visit 01/21/2011 12/24/2010 12/05/2010 2/99/2426 02/13/4195  SYSTOLIC 222 979 892 119 84  DIASTOLIC 88 78 90 80 56  PULSE 80  87 74 63  TEMPERATURE   97.7    RESPIRATIONS    14 18  Weight (lb) 236 234 244 240 225.5  HEIGHT 6' 0"  6' 0"  6' 0"  6' 1"  6' 1"   BMI 32 31.73 33.09 31.67 29.76   General:  Somewhat groggy. But able to have full conversation. HEENT: normal Neck: supple. JVP flat. Carotids 2+ bilaterally; no bruits. No lymphadenopathy or thryomegaly appreciated. Cor: PMI normal. Regular rate & rhythm. No rubs, gallops or murmurs. Lungs: Coarse through out Abdomen: obese soft, nontender, nondistended. No hepatosplenomegaly. No bruits or masses. Good bowel sounds. Extremities: no cyanosis, clubbing, rash, edema Neuro: alert & oriented x 3, cranial nerves grossly intact. Moves all 4 extremities w/o difficulty. Affect pleasant.    ASSESSMENT & PLAN:

## 2011-06-12 NOTE — Assessment & Plan Note (Signed)
He appears to have a significant functional limitation but I suspect that this is mostly related to his back pain and obesity. Despite his reduced EF, I do not think HF is the major player here. (He remains incredulous that we interpreted our echos correctly and does not understand how EF could have improved if angioplasty failed.) Volume status looks good though he continues to gain weight.   We will try to repeat CPX test prior to his visit with Dr. Stann Mainland at [facility]. And hopefully he will have a repeat echo there to get confirmation on his EF.   I have told him that I think his heart can tolerate back surgery and I think this would be important for him as he appears to be getting more debilitated and I worry about how much pain medicine he is requring. He was dubious of this and I asked him to get Dr. Stann Mainland' opinion on his candidacy for surgery as well.   Will continue his current meds for now with hopes of titrating after his visit to [facility]. We did discuss possible addition of digoxin but I do not think his symptoms warrant at this point.

## 2011-06-13 NOTE — Progress Notes (Signed)
Encounter addended by: Brendaly Townsel H Tonni Mansour on: 06/13/2011 10:34 AM<BR>     Documentation filed: Charges VN

## 2011-06-16 ENCOUNTER — Ambulatory Visit (INDEPENDENT_AMBULATORY_CARE_PROVIDER_SITE_OTHER): Payer: BC Managed Care – PPO | Admitting: Psychology

## 2011-06-16 DIAGNOSIS — F4323 Adjustment disorder with mixed anxiety and depressed mood: Secondary | ICD-10-CM

## 2011-06-17 ENCOUNTER — Ambulatory Visit (HOSPITAL_COMMUNITY): Payer: BC Managed Care – PPO

## 2011-06-18 ENCOUNTER — Ambulatory Visit (HOSPITAL_BASED_OUTPATIENT_CLINIC_OR_DEPARTMENT_OTHER): Payer: BC Managed Care – PPO | Admitting: Physical Medicine & Rehabilitation

## 2011-06-18 ENCOUNTER — Encounter: Payer: BC Managed Care – PPO | Attending: Physical Medicine & Rehabilitation

## 2011-06-18 DIAGNOSIS — M48061 Spinal stenosis, lumbar region without neurogenic claudication: Secondary | ICD-10-CM | POA: Insufficient documentation

## 2011-06-18 DIAGNOSIS — M79609 Pain in unspecified limb: Secondary | ICD-10-CM | POA: Insufficient documentation

## 2011-06-18 DIAGNOSIS — M549 Dorsalgia, unspecified: Secondary | ICD-10-CM | POA: Insufficient documentation

## 2011-06-18 DIAGNOSIS — G894 Chronic pain syndrome: Secondary | ICD-10-CM

## 2011-06-18 DIAGNOSIS — IMO0002 Reserved for concepts with insufficient information to code with codable children: Secondary | ICD-10-CM | POA: Insufficient documentation

## 2011-06-19 ENCOUNTER — Encounter: Payer: Self-pay | Admitting: *Deleted

## 2011-06-19 ENCOUNTER — Ambulatory Visit: Payer: BC Managed Care – PPO | Admitting: Psychology

## 2011-06-19 DIAGNOSIS — I472 Ventricular tachycardia: Secondary | ICD-10-CM | POA: Insufficient documentation

## 2011-06-19 NOTE — Assessment & Plan Note (Signed)
This is a patient of Dr. Wynn Banker, that was scheduled today, but due to the time constraints, I did see the patient.  He has got a history of lumbar spinal stenosis.  It is nonoperative due to his congestive heart failure and ejection fraction of 20.  I rates his average pain at 9.  It is a sharp, burning, stabbing pain.  He states that he does not feel like the morphine as to releases frequent enough for him given how much medicine he has been on the past.  He does rate his activity level when the pain is worse.  He does state it is worse with walking and standing. Rest and medication help.  He uses a walker for ambulation.  He does not climb steps.  He does drive.  Functionally, he still works as a Teacher, English as a foreign language.  REVIEW OF SYSTEMS:  Notable for difficulties as above, otherwise, within normal limits.  PAST MEDICAL HISTORY:  Unchanged.  SOCIAL HISTORY:  Unchanged.  FAMILY HISTORY:  Unchanged.  PHYSICAL EXAM:  VITAL SIGNS:  His blood pressure is 124/77, pulse 105, respirations 16, O2 sats 93 on room air. GENERAL:  He is alert and oriented x3, very kyphotic in his gait. LOWER EXTREMITY:  Strength and sensation are intact.  IMPRESSION: 1. Lumbar spinal stenosis, with intolerance to ambulation very long. 2. Congestive heart failure, followed by Cardiology.  PLAN: 1. Refill Neurontin 800 mg 1 p.o. q.i.d., #120 with 3 refills. 2. Per the recommendation of his cardiologist, we will start on     lactulose 15-30 meals twice a day as needed. 3. Morphine IR 30 mg.  We will increase it to 1 p.o. q.6 hours p.r.n.,     #120 with no refill. 4. MS Contin CR 100 mg 1 p.o. t.i.d., #90 with no refill.  His     questions were encouraged and answered.  He will see Dr. Wynn Banker     next month.     Thamara Leger L. Blima Dessert Electronically Signed    RLW/MedQ D:  06/18/2011 12:55:23  T:  06/19/2011 01:34:32  Job #:  [id number]

## 2011-06-22 ENCOUNTER — Ambulatory Visit: Payer: BC Managed Care – PPO | Admitting: Psychology

## 2011-06-22 ENCOUNTER — Encounter: Payer: Self-pay | Admitting: Surgery

## 2011-06-26 ENCOUNTER — Ambulatory Visit: Payer: BC Managed Care – PPO | Admitting: Psychology

## 2011-06-29 ENCOUNTER — Ambulatory Visit: Payer: BC Managed Care – PPO | Admitting: Psychology

## 2011-07-01 ENCOUNTER — Ambulatory Visit: Payer: BC Managed Care – PPO | Attending: Physical Medicine & Rehabilitation | Admitting: Physical Therapy

## 2011-07-01 DIAGNOSIS — M6281 Muscle weakness (generalized): Secondary | ICD-10-CM | POA: Insufficient documentation

## 2011-07-01 DIAGNOSIS — M2550 Pain in unspecified joint: Secondary | ICD-10-CM | POA: Insufficient documentation

## 2011-07-01 DIAGNOSIS — IMO0001 Reserved for inherently not codable concepts without codable children: Secondary | ICD-10-CM | POA: Insufficient documentation

## 2011-07-01 DIAGNOSIS — R262 Difficulty in walking, not elsewhere classified: Secondary | ICD-10-CM | POA: Insufficient documentation

## 2011-07-03 ENCOUNTER — Ambulatory Visit: Payer: BC Managed Care – PPO | Admitting: Psychology

## 2011-07-10 ENCOUNTER — Encounter: Payer: BC Managed Care – PPO | Admitting: Internal Medicine

## 2011-07-15 ENCOUNTER — Encounter: Payer: BC Managed Care – PPO | Admitting: Physical Therapy

## 2011-07-15 ENCOUNTER — Encounter: Payer: BC Managed Care – PPO | Attending: Neurosurgery | Admitting: Neurosurgery

## 2011-07-15 DIAGNOSIS — M545 Low back pain, unspecified: Secondary | ICD-10-CM | POA: Insufficient documentation

## 2011-07-15 DIAGNOSIS — R5381 Other malaise: Secondary | ICD-10-CM | POA: Insufficient documentation

## 2011-07-15 DIAGNOSIS — R209 Unspecified disturbances of skin sensation: Secondary | ICD-10-CM | POA: Insufficient documentation

## 2011-07-15 DIAGNOSIS — G894 Chronic pain syndrome: Secondary | ICD-10-CM

## 2011-07-15 DIAGNOSIS — R279 Unspecified lack of coordination: Secondary | ICD-10-CM | POA: Insufficient documentation

## 2011-07-15 DIAGNOSIS — M48061 Spinal stenosis, lumbar region without neurogenic claudication: Secondary | ICD-10-CM

## 2011-07-15 DIAGNOSIS — R5383 Other fatigue: Secondary | ICD-10-CM | POA: Insufficient documentation

## 2011-07-15 DIAGNOSIS — M79609 Pain in unspecified limb: Secondary | ICD-10-CM | POA: Insufficient documentation

## 2011-07-15 DIAGNOSIS — I509 Heart failure, unspecified: Secondary | ICD-10-CM | POA: Insufficient documentation

## 2011-07-16 NOTE — Assessment & Plan Note (Signed)
This is a patient of Dr. Wynn Banker is seen for chronic low back and right leg pain.  The patient states that his cardiologist at [facility] has told him that it is all that he can get him through a "3 or 4-hour" back surgery.  The question will be if Anesthesia would go along with that. He is going to investigate that through that doctor.  He has investigated with them the possible need for a decompressive laminectomy, and he will keep Dr. Wynn Banker informed of what their decisions would be.  He rates his average pain at a 7 or 9, it is a sharp, burning, stabbing pain.  General activity level is 9.  The pain is same 24 hours a day.  Sleep patterns are poor.  Walking, standing and activity aggravate.  Rest and medication helps.  He walks with without assistance.  He does use a rolling walker.  He does drive.  He can walk until just a couple months at a time.  Functionally, he is still employed.  He works about 10-15 hours a week.  He needs help with ADLs and household duties.  REVIEW OF SYSTEMS:  Notable for difficulties described above as well as some bowel and bladder control issues, weakness, numbness, trouble walking, spasms anxiety.  No suicidal thoughts or aberrant behaviors. Last pill count and UDS consistent.  PAST MEDICAL HISTORY SOCIAL HISTORY, AND FAMILY HISTORY:  Unchanged.  PHYSICAL EXAMINATION:  His blood pressure is 134/90, pulse 86, respirations 16, O2 sats 95 on room air.  Motor strength and sensation are quite diminished in lower extremities.  Constitutionally, he is within normal limits.  He is alert and oriented x3.  He has a very unstable kyphotic gait.  IMPRESSIONS: 1. Lumbar spinal stenosis, trouble with ambulation. 2. Congestive heart failure.  Followed up by Cardiology.  PLAN: 1. Refill morphine sulfate IR 30 mg 1 p.o. q.6 hours p.r.n., 120 with     no refill. 2. MS Contin CR 100 mg 1 p.o. t.i.d., 90 with no refills.  His     questions were encouraged and  answered.  He will see Dr. Wynn Banker     in a month.     Latavia Goga L. Blima Dessert Electronically Signed    RLW/MedQ D:  07/15/2011 15:56:28  T:  07/16/2011 04:53:19  Job #:  [id number]

## 2011-07-20 ENCOUNTER — Encounter: Payer: BC Managed Care – PPO | Admitting: Surgery

## 2011-07-20 ENCOUNTER — Ambulatory Visit: Payer: BC Managed Care – PPO | Admitting: Physical Medicine & Rehabilitation

## 2011-07-20 ENCOUNTER — Other Ambulatory Visit: Payer: BC Managed Care – PPO

## 2011-07-21 ENCOUNTER — Encounter: Payer: BC Managed Care – PPO | Admitting: Physical Therapy

## 2011-07-23 ENCOUNTER — Encounter: Payer: BC Managed Care – PPO | Admitting: Physical Therapy

## 2011-07-24 ENCOUNTER — Encounter: Payer: Self-pay | Admitting: Surgery

## 2011-07-27 ENCOUNTER — Encounter: Payer: BC Managed Care – PPO | Admitting: Surgery

## 2011-07-27 ENCOUNTER — Other Ambulatory Visit: Payer: BC Managed Care – PPO

## 2011-07-28 ENCOUNTER — Encounter: Payer: BC Managed Care – PPO | Admitting: Physical Therapy

## 2011-07-28 ENCOUNTER — Ambulatory Visit: Payer: BC Managed Care – PPO | Admitting: Physical Therapy

## 2011-07-30 ENCOUNTER — Encounter: Payer: BC Managed Care – PPO | Admitting: Physical Therapy

## 2011-07-31 ENCOUNTER — Ambulatory Visit: Payer: BC Managed Care – PPO | Attending: Physical Medicine & Rehabilitation | Admitting: Physical Therapy

## 2011-07-31 DIAGNOSIS — IMO0001 Reserved for inherently not codable concepts without codable children: Secondary | ICD-10-CM | POA: Insufficient documentation

## 2011-07-31 DIAGNOSIS — M6281 Muscle weakness (generalized): Secondary | ICD-10-CM | POA: Insufficient documentation

## 2011-07-31 DIAGNOSIS — R262 Difficulty in walking, not elsewhere classified: Secondary | ICD-10-CM | POA: Insufficient documentation

## 2011-07-31 DIAGNOSIS — M2550 Pain in unspecified joint: Secondary | ICD-10-CM | POA: Insufficient documentation

## 2011-08-04 ENCOUNTER — Encounter: Payer: BC Managed Care – PPO | Admitting: Physical Therapy

## 2011-08-06 ENCOUNTER — Ambulatory Visit: Payer: BC Managed Care – PPO | Admitting: Physical Therapy

## 2011-08-06 ENCOUNTER — Encounter: Payer: BC Managed Care – PPO | Admitting: Physical Therapy

## 2011-08-11 ENCOUNTER — Encounter: Payer: BC Managed Care – PPO | Admitting: Physical Therapy

## 2011-08-12 ENCOUNTER — Ambulatory Visit: Payer: BC Managed Care – PPO | Admitting: Physical Therapy

## 2011-08-13 ENCOUNTER — Encounter: Payer: BC Managed Care – PPO | Admitting: Physical Therapy

## 2011-08-13 ENCOUNTER — Ambulatory Visit: Payer: BC Managed Care – PPO | Admitting: Physical Therapy

## 2011-08-14 ENCOUNTER — Other Ambulatory Visit: Payer: Self-pay | Admitting: Family Medicine

## 2011-08-14 ENCOUNTER — Encounter: Payer: BC Managed Care – PPO | Attending: Physical Medicine & Rehabilitation

## 2011-08-14 ENCOUNTER — Ambulatory Visit (HOSPITAL_BASED_OUTPATIENT_CLINIC_OR_DEPARTMENT_OTHER): Payer: BC Managed Care – PPO | Admitting: Physical Medicine & Rehabilitation

## 2011-08-14 ENCOUNTER — Ambulatory Visit: Payer: BC Managed Care – PPO | Admitting: Physical Medicine & Rehabilitation

## 2011-08-14 DIAGNOSIS — M48061 Spinal stenosis, lumbar region without neurogenic claudication: Secondary | ICD-10-CM

## 2011-08-14 DIAGNOSIS — G894 Chronic pain syndrome: Secondary | ICD-10-CM

## 2011-08-14 DIAGNOSIS — M79609 Pain in unspecified limb: Secondary | ICD-10-CM | POA: Insufficient documentation

## 2011-08-14 DIAGNOSIS — IMO0002 Reserved for concepts with insufficient information to code with codable children: Secondary | ICD-10-CM | POA: Insufficient documentation

## 2011-08-14 DIAGNOSIS — M549 Dorsalgia, unspecified: Secondary | ICD-10-CM | POA: Insufficient documentation

## 2011-08-15 NOTE — Assessment & Plan Note (Signed)
This is patient of Dr. Wynn Banker, seen for lower extremity pain and low back pain.  Reports no pain, change in his pain at 8.  General activity level is 7.  Pain is worse during the day, evening, and night.  Sleep patterns are poor.  Walking, standing, and activity aggravate; therapy and medication helps.  He walks with walker with assistance.  He does climb steps.  He does not drive.  Functionally, he is still working as a Teacher, English as a foreign language, needs help with household duties.  REVIEW OF SYSTEMS:  Notable for difficulties as described above otherwise unremarkable.  No suicidal thoughts or aberrant behaviors.  He did not bring his pill bottles in with him today.  Dr. Wynn Banker is okay with his refills.  Past medical history, social history, and family history are unchanged.  PHYSICAL EXAMINATION:  VITAL SIGNS:  His blood pressure is 158/92, pulse 96, respirations 18, O2 sats 100 on room air. MUSCULOSKELETAL:  His motor strength, sensation is somewhat diminished. NEUROLOGIC:  Constitutionally, he is obese, he is alert and oriented x3, and does have a limp.  IMPRESSION: 1. Lumbar spinal stenosis. 2. History of congestive heart failure.  PLAN: 1. Refill MS Contin CR 100 mg 1 p.o. t.i.d., 90 with no refill. 2. Morphine sulfate 30 mg 1 p.o. q.6 hours p.r.n., 120 with no     refills.  Questions were encouraged and answered.  He will follow up here in a month.     [name] L. Blima Dessert Electronically Signed    RLW/MedQ D:  08/14/2011 15:28:37  T:  08/15/2011 04:55:16  Job #:  [id number]

## 2011-08-18 ENCOUNTER — Encounter: Payer: BC Managed Care – PPO | Admitting: Physical Therapy

## 2011-08-20 ENCOUNTER — Encounter: Payer: BC Managed Care – PPO | Admitting: Physical Therapy

## 2011-08-21 ENCOUNTER — Encounter: Payer: BC Managed Care – PPO | Admitting: Physical Therapy

## 2011-08-25 ENCOUNTER — Encounter: Payer: BC Managed Care – PPO | Admitting: Physical Therapy

## 2011-08-26 ENCOUNTER — Encounter: Payer: BC Managed Care – PPO | Admitting: Physical Therapy

## 2011-09-10 ENCOUNTER — Telehealth: Payer: Self-pay | Admitting: Physical Medicine & Rehabilitation

## 2011-09-10 NOTE — Telephone Encounter (Signed)
Patient asked  April to call and make Dr. Letta Pate aware of why they d/c'd.  Rehab d/c'd because of 4 NOS.  The reason he NOS was  Because he had been at the transplant unit at [facility].  Patient would like to resume PT, but Rehab needs a new order.

## 2011-09-11 ENCOUNTER — Ambulatory Visit: Payer: BC Managed Care – PPO | Admitting: Physical Medicine & Rehabilitation

## 2011-09-11 ENCOUNTER — Encounter: Payer: Self-pay | Admitting: *Deleted

## 2011-09-11 ENCOUNTER — Encounter: Payer: BC Managed Care – PPO | Attending: Physical Medicine & Rehabilitation | Admitting: *Deleted

## 2011-09-11 ENCOUNTER — Other Ambulatory Visit: Payer: Self-pay | Admitting: *Deleted

## 2011-09-11 ENCOUNTER — Encounter: Payer: BC Managed Care – PPO | Attending: Physical Medicine & Rehabilitation

## 2011-09-11 VITALS — BP 109/60 | HR 83 | Resp 18 | Ht 61.0 in | Wt 255.0 lb

## 2011-09-11 DIAGNOSIS — R262 Difficulty in walking, not elsewhere classified: Secondary | ICD-10-CM | POA: Insufficient documentation

## 2011-09-11 DIAGNOSIS — M545 Low back pain, unspecified: Secondary | ICD-10-CM | POA: Insufficient documentation

## 2011-09-11 DIAGNOSIS — M79609 Pain in unspecified limb: Secondary | ICD-10-CM | POA: Insufficient documentation

## 2011-09-11 DIAGNOSIS — IMO0002 Reserved for concepts with insufficient information to code with codable children: Secondary | ICD-10-CM

## 2011-09-11 DIAGNOSIS — M48061 Spinal stenosis, lumbar region without neurogenic claudication: Secondary | ICD-10-CM | POA: Insufficient documentation

## 2011-09-11 DIAGNOSIS — M549 Dorsalgia, unspecified: Secondary | ICD-10-CM | POA: Insufficient documentation

## 2011-09-11 DIAGNOSIS — G8929 Other chronic pain: Secondary | ICD-10-CM | POA: Insufficient documentation

## 2011-09-11 DIAGNOSIS — I509 Heart failure, unspecified: Secondary | ICD-10-CM | POA: Insufficient documentation

## 2011-09-11 MED ORDER — MORPHINE SULFATE CR 100 MG PO TB12: 100.0000 mg | ORAL_TABLET | Freq: Two times a day (BID) | ORAL | Status: DC

## 2011-09-11 MED ORDER — MORPHINE SULFATE 30 MG PO TABS: 30.0000 mg | ORAL_TABLET | Freq: Four times a day (QID) | ORAL | Status: DC | PRN

## 2011-09-11 NOTE — Telephone Encounter (Signed)
If this is okay, please place an order in the system and I will fax to PT. Thanks.

## 2011-09-11 NOTE — Telephone Encounter (Signed)
Will address at next visit

## 2011-09-11 NOTE — Progress Notes (Signed)
Requests handicap parking placard. No other questions voiced. No change in pain complaint at this time.

## 2011-09-17 ENCOUNTER — Encounter (HOSPITAL_COMMUNITY): Payer: BC Managed Care – PPO

## 2011-09-18 ENCOUNTER — Telehealth: Payer: Self-pay | Admitting: Physical Medicine & Rehabilitation

## 2011-09-18 NOTE — Telephone Encounter (Signed)
Okay to write Rx before he leaves

## 2011-09-18 NOTE — Telephone Encounter (Signed)
Patient cannot come in on 25th, closing on house in [place].  Will Dr. Moise Boring Rx before he leaves on 26th?

## 2011-09-18 NOTE — Telephone Encounter (Signed)
Please advise 

## 2011-09-21 ENCOUNTER — Encounter: Payer: Self-pay | Admitting: *Deleted

## 2011-09-21 NOTE — Telephone Encounter (Signed)
LM with pt to call us a little closer to when he will need rx and we will get these ready for him.

## 2011-09-28 ENCOUNTER — Telehealth: Payer: Self-pay | Admitting: Physical Medicine & Rehabilitation

## 2011-09-28 NOTE — Telephone Encounter (Signed)
Patient needs refill on Morphine ER and IR.

## 2011-10-01 ENCOUNTER — Telehealth: Payer: Self-pay | Admitting: Physical Medicine & Rehabilitation

## 2011-10-01 NOTE — Telephone Encounter (Signed)
Needs medication before he leaves for [place].

## 2011-10-02 MED ORDER — MORPHINE SULFATE CR 100 MG PO TB12: 100.0000 mg | ORAL_TABLET | Freq: Two times a day (BID) | ORAL | Status: DC

## 2011-10-02 MED ORDER — MORPHINE SULFATE 30 MG PO TABS: 30.0000 mg | ORAL_TABLET | Freq: Four times a day (QID) | ORAL | Status: DC | PRN

## 2011-10-02 NOTE — Telephone Encounter (Signed)
Pt aware rx are ready for pick up.

## 2011-10-02 NOTE — Telephone Encounter (Signed)
I tried printing rx but I couldn't get to print. Please print and sign.

## 2011-10-02 NOTE — Telephone Encounter (Signed)
I need chart.  It is not clear what meds and doses are

## 2011-10-05 ENCOUNTER — Ambulatory Visit: Payer: BC Managed Care – PPO | Admitting: Physical Medicine & Rehabilitation

## 2011-10-12 ENCOUNTER — Ambulatory Visit (HOSPITAL_COMMUNITY): Admission: RE | Admit: 2011-10-12 | Payer: BC Managed Care – PPO | Source: Ambulatory Visit

## 2011-10-29 ENCOUNTER — Telehealth: Payer: Self-pay | Admitting: Internal Medicine

## 2011-10-29 NOTE — Telephone Encounter (Signed)
03-15-82 rtn past due certified letter, cell # mailbox full, but gave option to leave a call back number, and I did this, call wife's number  n/a sounded like a fax/mt

## 2011-11-02 ENCOUNTER — Telehealth (HOSPITAL_COMMUNITY): Payer: Self-pay | Admitting: *Deleted

## 2011-11-02 NOTE — Telephone Encounter (Signed)
Mr Kerschner called today.  He has a dentist appointment on Wednesday with Dr Geanie Cooley [phone].  He needs to have an antibiotic ordered so he can complete this appointment.  Thank.

## 2011-11-03 NOTE — Telephone Encounter (Signed)
Per Dr Haroldine Laws pt does not need antibiotics prior to dental work, unable to reach pt and his VM is full, have faxed note to his dentist

## 2011-11-04 NOTE — Telephone Encounter (Signed)
Close  

## 2011-11-09 ENCOUNTER — Encounter: Payer: Self-pay | Admitting: Physical Medicine & Rehabilitation

## 2011-11-09 ENCOUNTER — Other Ambulatory Visit: Payer: Self-pay

## 2011-11-09 ENCOUNTER — Encounter: Payer: BC Managed Care – PPO | Attending: Physical Medicine & Rehabilitation

## 2011-11-09 ENCOUNTER — Ambulatory Visit (HOSPITAL_BASED_OUTPATIENT_CLINIC_OR_DEPARTMENT_OTHER): Payer: BC Managed Care – PPO | Admitting: Physical Medicine & Rehabilitation

## 2011-11-09 VITALS — BP 130/76 | HR 52 | Resp 16 | Ht 72.0 in | Wt 254.0 lb

## 2011-11-09 DIAGNOSIS — M549 Dorsalgia, unspecified: Secondary | ICD-10-CM | POA: Insufficient documentation

## 2011-11-09 DIAGNOSIS — M79609 Pain in unspecified limb: Secondary | ICD-10-CM | POA: Insufficient documentation

## 2011-11-09 DIAGNOSIS — M48061 Spinal stenosis, lumbar region without neurogenic claudication: Secondary | ICD-10-CM

## 2011-11-09 DIAGNOSIS — IMO0002 Reserved for concepts with insufficient information to code with codable children: Secondary | ICD-10-CM | POA: Insufficient documentation

## 2011-11-09 DIAGNOSIS — M48062 Spinal stenosis, lumbar region with neurogenic claudication: Secondary | ICD-10-CM

## 2011-11-09 MED ORDER — MS CONTIN 100 MG PO TBCR: 100.0000 mg | EXTENDED_RELEASE_TABLET | Freq: Three times a day (TID) | ORAL | Status: DC

## 2011-11-09 MED ORDER — MORPHINE SULFATE 30 MG PO TABS: 30.0000 mg | ORAL_TABLET | Freq: Four times a day (QID) | ORAL | Status: DC | PRN

## 2011-11-09 NOTE — Patient Instructions (Signed)
Chronic Pain Management Managing chronic pain is not easy. The goal is to provide as much pain relief as possible. There are emotional as well as physical problems. Chronic pain may lead to symptoms of depression which magnify those of the pain. Problems may include:  Anxiety.   Sleep disturbances.   Confused thinking.   Feeling cranky.   Fatigue.   Weight gain or loss.  Identify the source of the pain first, if possible. The pain may be masking another problem. Try to find a pain management specialist or clinic. Work with a team to create a treatment plan for you. MEDICATIONS  May include narcotics or opioids. Larger than normal doses may be needed to control your pain.   Drugs for depression may help.   Over-the-counter medicines may help for some conditions. These drugs may be used along with others for better pain relief.   May be injected into sites such as the spine and joints. Injections may have to be repeated if they wear off.  THERAPY MAY INCLUDE:  Working with a physical therapist to keep from getting stiff.   Regular, gentle exercise.   Cognitive or behavioral therapy.   Using complementary or integrative medicine such as:   Acupuncture.   Massage, Reiki, or Rolfing.   Aroma, color, light, or sound therapy.   Group support.  FOR MORE INFORMATION https://www.rubio.com/. American Chronic Pain Association http://www.mcdowell.com/. Document Released: 08/06/2004 Document Revised: 06/18/2011 Document Reviewed: 09/15/2007 [facility] Patient Information 2012 [place], [place].

## 2011-11-09 NOTE — Progress Notes (Signed)
  Subjective:    Patient ID: [name], male    DOB: [birth year], 51 y.o.   MRN: [id number]  HPI I had last seen the patient in the fall 2012. He has seen my PA the last 3 visits. Interval history has had cardiac stents x2 placed approximately 3 weeks ago at [facility]. His cardiologist at [facility] told him that he would likely suffer a myocardial infarction if he underwent back surgery. The last note from Dr. Fredda Hammed the patient's local cardiologist indicated that he should be a will to tolerate back surgery. In either case the patient would like to clarify this before deciding on having lumbar decompressive surgery. He has had no new medical problems other than those listed above. He states he's had testosterone level checked but plans to follow up with his urologist. No signs of aberrant drug behavior. No recent urine drug screen Pain Inventory Average Pain 8 Pain Right Now 8 My pain is sharp and burning  In the last 24 hours, has pain interfered with the following? General activity 8 Relation with others 5 Enjoyment of life 7 What TIME of day is your pain at its worst? Daytime and Evening Sleep (in general) Poor  Pain is worse with: walking, bending, standing and some activites Pain improves with: medication Relief from Meds: 5  Mobility use a walker do you drive?  yes  Function employed # of hrs/week 20 I need assistance with the following:  dressing, bathing, meal prep, household duties and shopping  Neuro/Psych bladder control problems bowel control problems numbness trouble walking anxiety  Prior Studies Any changes since last visit?  no  Physicians involved in your care Any changes since last visit?  no  Review of Systems  Constitutional: Negative.   HENT: Negative.   Eyes: Negative.   Respiratory: Negative.   Cardiovascular: Negative.   Gastrointestinal: Negative.   Genitourinary: Negative.   Musculoskeletal: Negative.   Skin: Negative.   Neurological:  Positive for numbness.  Hematological: Negative.   Psychiatric/Behavioral: Negative.        Objective:   Physical Exam  Nursing note and vitals reviewed. Constitutional: He is oriented to person, place, and time. He appears well-developed.  Musculoskeletal:       Lumbar back: He exhibits decreased range of motion. He exhibits no tenderness.       Lumbar for flexion is 75%, extension is 25%  Neurological: He is alert and oriented to person, place, and time. He has normal strength. Gait abnormal.  Reflex Scores:      Patellar reflexes are 2+ on the right side and 2+ on the left side.      Achilles reflexes are 1+ on the right side and 1+ on the left side.      Stenotic gait          Assessment & Plan:  1. Lumbar spinal stenosis with neurogenic claudication. He would benefit from decompressive surgery however the patient has coronary artery disease as well as cardiomyopathy. He is reportedly on the heart transplant list at [facility]. He recently underwent angioplasty and stenting of 2 vessels. He plans to talk to his cardiologist at [facility] once again about his surgical risk. Will continue his current pain medications. No signs of aberrant drug behavior. No recent urine drug screen so will repeat for monitoring purposes. PA visit in one month I'll see him back on a when necessary basis

## 2011-11-12 ENCOUNTER — Ambulatory Visit (HOSPITAL_COMMUNITY): Payer: BC Managed Care – PPO

## 2011-11-17 ENCOUNTER — Ambulatory Visit (HOSPITAL_COMMUNITY)
Admission: RE | Admit: 2011-11-17 | Discharge: 2011-11-17 | Disposition: A | Discharge status: Home / Self Care | Payer: BC Managed Care – PPO | Source: Ambulatory Visit | Attending: Internal Medicine | Admitting: Internal Medicine

## 2011-11-17 ENCOUNTER — Encounter (HOSPITAL_COMMUNITY): Payer: Self-pay

## 2011-11-17 VITALS — BP 130/60 | HR 91 | Resp 18 | Ht 73.0 in | Wt 249.4 lb

## 2011-11-17 DIAGNOSIS — F329 Major depressive disorder, single episode, unspecified: Secondary | ICD-10-CM | POA: Insufficient documentation

## 2011-11-17 DIAGNOSIS — I2510 Atherosclerotic heart disease of native coronary artery without angina pectoris: Secondary | ICD-10-CM

## 2011-11-17 DIAGNOSIS — F1920 Other psychoactive substance dependence, uncomplicated: Secondary | ICD-10-CM | POA: Insufficient documentation

## 2011-11-17 DIAGNOSIS — M549 Dorsalgia, unspecified: Secondary | ICD-10-CM | POA: Insufficient documentation

## 2011-11-17 DIAGNOSIS — I509 Heart failure, unspecified: Secondary | ICD-10-CM | POA: Insufficient documentation

## 2011-11-17 DIAGNOSIS — I2589 Other forms of chronic ischemic heart disease: Secondary | ICD-10-CM

## 2011-11-17 DIAGNOSIS — G4733 Obstructive sleep apnea (adult) (pediatric): Secondary | ICD-10-CM | POA: Insufficient documentation

## 2011-11-17 DIAGNOSIS — E669 Obesity, unspecified: Secondary | ICD-10-CM | POA: Insufficient documentation

## 2011-11-17 DIAGNOSIS — I5042 Chronic combined systolic (congestive) and diastolic (congestive) heart failure: Secondary | ICD-10-CM | POA: Insufficient documentation

## 2011-11-17 DIAGNOSIS — Z01810 Encounter for preprocedural cardiovascular examination: Secondary | ICD-10-CM

## 2011-11-17 DIAGNOSIS — G8929 Other chronic pain: Secondary | ICD-10-CM | POA: Insufficient documentation

## 2011-11-17 DIAGNOSIS — F3289 Other specified depressive episodes: Secondary | ICD-10-CM | POA: Insufficient documentation

## 2011-11-17 DIAGNOSIS — Z7982 Long term (current) use of aspirin: Secondary | ICD-10-CM | POA: Insufficient documentation

## 2011-11-17 MED ORDER — CARVEDILOL 12.5 MG PO TABS: 18.7500 mg | ORAL_TABLET | Freq: Two times a day (BID) | ORAL | Status: DC

## 2011-11-17 NOTE — Progress Notes (Signed)
HPI:  [name] is a 51 year old man with a history of obesity, OSA, depression, chronic back pain on high-dose narcotics and severe coronary artery disease complicated by an ischemic cardiomyopathy/heart failure with an EF in the 25-35% range.  Last cath 2011 showed:  LVEF 20% LM: ostial 20% LAD: Diffuse  40% prox, mid 60-70% focal lesion.  D1: 30% tubular lesion. LCX: gave off a ramus branch, small OM-1.  The distal AV groove circ was subtotally occluded which was chronic.  In the ramus branch, there was evidence of a previously placed stent.  This was now totally occluded.  In the OM-1, there was a 40% proximal lesion. RCA:  dominant vessel, had diffuse 40-50% disease throughout.  In the distal RCA, prior to the PDA, there was a tubular 40% lesion.  In the distal RCA just after the takeoff of the PDA, there was a 99% lesion with a near subtotal occlusion.  In the PDA, there was a 5 0% tubular lesion distally. Failed PCI of distal RCA. Anatomy not favorable for CABG.  He is s/p St. Jude  ICD implantation. Now enrolled in Analyze ST. Device checked today and was functioning properly with no VT.  Most recent Echo 04/06/11; EF 35-40% mild RV hypokinesis. Mild MR.  Saw Dr. Stann Mainland at [facility]. Underwent cath at [facility] in 3/13 and had 2.5x75m Xience DES placed in LAD.   He presents for f/u. Back pain better. Legs and perineum now getting numb. Denies significant dyspnea says main limitation is his legs. [facility]  No orthopnea, PND or edema. Reports compliance with meds. Sweating heavily. Weight stable. Taking carvedilol 25 mg qhs.   ROS: All systems negative except as listed in HPI, PMH and Problem List.  Past Medical History  Diagnosis Date  . Chronic systolic heart failure     EF 20-25%. s/p ST. Jude ICD  . CAD (coronary artery disease)     Last cath 2/12. 3-v CAD. Failed PCI of distal RCA  . Chronic back pain     lumbar stenosis  . Benign prostatic  hypertrophy   . Depression   . History of testicular cancer   . Narcotic dependence     chronic  . Benzodiazepine dependence     chronic  . Anxiety   . CHF (congestive heart failure)   . DJD (degenerative joint disease)     Current Outpatient Prescriptions  Medication Sig Dispense Refill  . aspirin 325 MG tablet Take 325 mg by mouth daily.      . carvedilol (COREG) 12.5 MG tablet Take 1.5 tablets (18.75 mg total) by mouth 2 (two) times daily with a meal.  90 tablet  6  . clopidogrel (PLAVIX) 75 MG tablet Take 1 tablet (75 mg total) by mouth daily.  30 tablet  6  . diazepam (VALIUM) 10 MG tablet Take 10 mg by mouth 2 (two) times daily.        .Marland Kitchenezetimibe (ZETIA) 10 MG tablet Take 10 mg by mouth daily.      .Marland Kitchengabapentin (NEURONTIN) 600 MG tablet Take 800 mg by mouth 4 (four) times daily. 5 times a day      . lactulose (CHRONULAC) 10 GM/15ML solution Take 30 mLs (20 g total) by mouth as needed.  240 mL  0  . lisinopril (PRINIVIL,ZESTRIL) 10 MG tablet Take 1 tablet (10 mg total) by mouth 2 (two) times daily.  60 tablet  3  . LORazepam (ATIVAN) 2  MG tablet Take 2 mg by mouth every 6 (six) hours as needed.        Marland Kitchen morphine (MSIR) 30 MG tablet Take 1 tablet (30 mg total) by mouth every 6 (six) hours as needed.  120 tablet  0  . MS CONTIN 100 MG PO TBCR Take 100 mg by mouth 3 (three) times daily.  90 tablet  0  . nitroGLYCERIN (NITROSTAT) 0.4 MG SL tablet Place 0.4 mg under the tongue every 5 (five) minutes as needed.       . rosuvastatin (CRESTOR) 40 MG tablet Take 1 tablet (40 mg total) by mouth daily.  30 tablet  6  . spironolactone (ALDACTONE) 25 MG tablet Take 25 mg by mouth daily.        Marland Kitchen testosterone cypionate (DEPOTESTOTERONE CYPIONATE) 200 MG/ML injection Inject into the muscle every 14 (fourteen) days.        Marland Kitchen torsemide (DEMADEX) 20 MG tablet Take 20 mg by mouth daily as needed.       . zolpidem (AMBIEN CR) 12.5 MG CR tablet Take 12.5 mg by mouth at bedtime as needed.        Marland Kitchen  alfuzosin (UROXATRAL) 10 MG 24 hr tablet Take 1 tablet (10 mg total) by mouth daily. As needed  30 tablet  2    PHYSICAL EXAM: Filed Vitals:   11/17/11 1004  BP: 130/60  Pulse: 91  Resp: 18    General:  Normal. No acute distress.  HEENT: normal Neck: supple. JVP flat. Carotids 2+ bilaterally; no bruits. No lymphadenopathy or thryomegaly appreciated. Cor: PMI normal. Regular rate & rhythm. No rubs, gallops or murmurs. Lungs: Coarse through out Abdomen: obese soft, nontender, nondistended. No hepatosplenomegaly. No bruits or masses. Good bowel sounds. Extremities: no cyanosis, clubbing, rash, edema Neuro: alert & oriented x 3, cranial nerves grossly intact. Moves all 4 extremities w/o difficulty. Affect pleasant.    ASSESSMENT & PLAN:

## 2011-11-17 NOTE — Patient Instructions (Signed)
Increase Carvedilol to 18.75 mg Twice daily   We will contact you in 3 months to schedule your next appointment.

## 2011-11-21 DIAGNOSIS — Z01810 Encounter for preprocedural cardiovascular examination: Secondary | ICD-10-CM | POA: Insufficient documentation

## 2011-11-21 NOTE — Assessment & Plan Note (Signed)
Stable NYHA II-III. Appears mainly limited by back pain and related leg weakness. Volume status looks ok. Titrate carvedilol to 18.75 bid.

## 2011-11-21 NOTE — Assessment & Plan Note (Signed)
Long discussion about whether or not he is candidate for back surgery. He said he was told at [facility] that he could not have surgery due to his heart and if he did he would have a "major heart attack.". I told him that while he was at increased risk for peri-op CV complications, given his coronary anatomy and relatively stable HF I did not think the risk was prohibitive. However, he does have 2 recent DES to LAD and would have to wait until he completed 1 year of Plavix unless there was an emergent need for surgery. I also explained that surgery was a risk/benefit question for him. If he was getting to the point where his spine disease could cause irreversible sensory or motor deficits then I think it is well worth the risk to have surgery. If surgery was less certain to help him then may be better to continue with medical therapy.

## 2011-11-21 NOTE — Assessment & Plan Note (Signed)
Stable s/p recent LAD PCI at [facility]. He had questions about the degree of LAD stenosis when we did his last cath here and we reviewed films together and confirmed that in 2011 LAD stenosis was 70% at worst and not candidate for PCI at that time. Continue DAPT.

## 2011-11-24 ENCOUNTER — Telehealth: Payer: Self-pay | Admitting: *Deleted

## 2011-11-24 NOTE — H&P (Signed)
[name] is an 51 y.o. male.   Chief Complaint: multiple non restorable teeth as well as lesion left lateral boarder of tongue GIT:JLLVDI on tongue present for more than 1 month, teeth occasionally painful  Past Medical History  Diagnosis Date  . Chronic systolic heart failure     EF 20-25%. s/p ST. Jude ICD  . CAD (coronary artery disease)     Last cath 2/12. 3-v CAD. Failed PCI of distal RCA  . Chronic back pain     lumbar stenosis  . Benign prostatic hypertrophy   . Depression   . History of testicular cancer   . Narcotic dependence     chronic  . Benzodiazepine dependence     chronic  . Anxiety   . CHF (congestive heart failure)   . DJD (degenerative joint disease)     Past Surgical History  Procedure Date  . Hernia repair   . Asd repair, sinus venosus   . Testicle surgery     testicular cancer surgery  . Cardiac defibrillator placement 08/2010    Family History  Problem Relation Age of Onset  . Heart disease Father 41    Died of MI   Social History:  reports that he has been smoking.  He uses smokeless tobacco. He reports that he uses illicit drugs (Marijuana). He reports that he does not drink alcohol.  Allergies:  Allergies  Allergen Reactions  . Nalbuphine   . Nubain (Nalbuphine Hcl)   . Propoxyphene And Methadone Other (See Comments)    THROAT CLOSES UP    No prescriptions prior to admission    No results found for this or any previous visit (from the past 48 hour(s)). No results found.  ROS  There were no vitals taken for this visit. Physical Exam  HENT:  Mouth/Throat: Uvula is midline, oropharynx is clear and moist and mucous membranes are normal. Oral lesions present. Dental caries present.       Assessment/Plan Biopsy of tongue Cut bridge and remove #2,  Extract #14, 22  Jacquie Lukes,JOSEPH L 11/24/2011, 12:36 PM

## 2011-11-24 NOTE — Consult Note (Signed)
This is a 51 y/o male who presents with a very significant cardiac history, narcotic use, and a hx of testicular cancer with chemo.  He is taking asa and plavix.  The patient was referred to [place] for removal of three teeth including cutting a bridge.  His soft tissue exam revealed a 76mx5mm mass on the left lateral boarder of the tongue.  An excisional bx will be done at the same time.  He has been cleared for surgery by his MD.

## 2011-11-24 NOTE — Telephone Encounter (Signed)
FYI - pt is having surgery at [facility] with Dr. Sabra Heck on his tongue.

## 2011-11-24 NOTE — Progress Notes (Signed)
The patient was referred by his general DDS for multiple extractions.  He has a very significant medical history including extensive cardiac disease, chronic narcotic use and testicular cancer. The patient has 3 teeth to be removed #2, 14, and 22.  A bridge will have to be cut to remover #2.  Because of his cardiac history he is not a candidate for office oral surgery.  We will be using hemostatic agents to counter both the asa and plavix use.  His oral soft tissue exam revealed a 35m x 522mfirm lesion on the left lateral boarder of the tongue.  This will be removed at the same time the teeth are taking out.

## 2011-11-27 ENCOUNTER — Telehealth (HOSPITAL_COMMUNITY): Payer: Self-pay | Admitting: *Deleted

## 2011-11-27 NOTE — Telephone Encounter (Signed)
Received form from the [facility] for clearance, per Dr Haroldine Laws pt ok from cardiac perspective to have oral surgery, form signed by him and faxed back to Dr Sabra Heck at [phone]

## 2011-11-30 ENCOUNTER — Encounter (HOSPITAL_COMMUNITY): Payer: Self-pay | Admitting: Pharmacy Technician

## 2011-12-02 ENCOUNTER — Inpatient Hospital Stay (HOSPITAL_COMMUNITY): Admission: RE | Admit: 2011-12-02 | Payer: BC Managed Care – PPO | Source: Ambulatory Visit

## 2011-12-03 ENCOUNTER — Inpatient Hospital Stay (HOSPITAL_COMMUNITY): Admission: RE | Admit: 2011-12-03 | Payer: BC Managed Care – PPO | Source: Ambulatory Visit

## 2011-12-04 ENCOUNTER — Encounter: Payer: Self-pay | Admitting: Physical Medicine and Rehabilitation

## 2011-12-04 ENCOUNTER — Encounter
Payer: BC Managed Care – PPO | Attending: Physical Medicine & Rehabilitation | Admitting: Physical Medicine and Rehabilitation

## 2011-12-04 VITALS — BP 126/88 | HR 78 | Resp 16 | Ht 73.0 in | Wt 243.0 lb

## 2011-12-04 DIAGNOSIS — M79609 Pain in unspecified limb: Secondary | ICD-10-CM | POA: Insufficient documentation

## 2011-12-04 DIAGNOSIS — M545 Low back pain, unspecified: Secondary | ICD-10-CM | POA: Insufficient documentation

## 2011-12-04 DIAGNOSIS — M479 Spondylosis, unspecified: Secondary | ICD-10-CM | POA: Insufficient documentation

## 2011-12-04 DIAGNOSIS — M4800 Spinal stenosis, site unspecified: Secondary | ICD-10-CM

## 2011-12-04 DIAGNOSIS — M48062 Spinal stenosis, lumbar region with neurogenic claudication: Secondary | ICD-10-CM | POA: Insufficient documentation

## 2011-12-04 DIAGNOSIS — I2510 Atherosclerotic heart disease of native coronary artery without angina pectoris: Secondary | ICD-10-CM | POA: Insufficient documentation

## 2011-12-04 DIAGNOSIS — I428 Other cardiomyopathies: Secondary | ICD-10-CM | POA: Insufficient documentation

## 2011-12-04 MED ORDER — MORPHINE SULFATE 30 MG PO TABS: 30.0000 mg | ORAL_TABLET | Freq: Four times a day (QID) | ORAL | Status: DC | PRN

## 2011-12-04 MED ORDER — MORPHINE SULFATE ER 100 MG PO TBCR: 100.0000 mg | EXTENDED_RELEASE_TABLET | Freq: Three times a day (TID) | ORAL | Status: DC

## 2011-12-04 NOTE — Progress Notes (Signed)
Subjective:    Patient ID: [name], male    DOB: 1961/03/28, 51 y.o.   MRN: [id number]  HPI The patient is a 51  year old  male, who presents with severe spinal spondylosis and HF .  The patient complains about moderate to severe pain in his lower back and LE bilateral . Taking his pain medications , and being in a flexed  position alleviate the symptoms. Prolonged standing or walking   aggrevates the symptoms. The patient grades his pain as a 7  /10. The patient reports that he needs a new prescription for his physical therapy. Pain Inventory Average Pain 8 Pain Right Now 7 My pain is sharp, burning, stabbing and tingling  In the last 24 hours, has pain interfered with the following? General activity 7 Relation with others 7 Enjoyment of life 8 What TIME of day is your pain at its worst? morning Sleep (in general) Fair  Pain is worse with: walking Pain improves with: medication Relief from Meds: 7  Mobility walk with assistance use a walker do you drive?  yes  Function employed # of hrs/week 20 I need assistance with the following:  dressing, bathing, meal prep, household duties and shopping  Neuro/Psych bladder control problems bowel control problems numbness tingling trouble walking anxiety  Prior Studies Any changes since last visit?  no  Physicians involved in your care Any changes since last visit?  no   Family History  Problem Relation Age of Onset  . Heart disease Father 2    Died of MI   History   Social History  . Marital Status: Married    Spouse Name: N/A    Number of Children: N/A  . Years of Education: N/A   Social History Main Topics  . Smoking status: Current Everyday Smoker -- 0.3 packs/day for 25 years  . Smokeless tobacco: Current User  . Alcohol Use: No  . Drug Use: Yes    Special: Marijuana     Urine showed THC  . Sexually Active: None   Other Topics Concern  . None   Social History Narrative  . None   Past Surgical  History  Procedure Date  . Hernia repair   . Asd repair, sinus venosus   . Testicle surgery     testicular cancer surgery  . Cardiac defibrillator placement 08/2010   Past Medical History  Diagnosis Date  . Chronic systolic heart failure     EF 20-25%. s/p ST. Jude ICD  . CAD (coronary artery disease)     Last cath 2/12. 3-v CAD. Failed PCI of distal RCA  . Chronic back pain     lumbar stenosis  . Benign prostatic hypertrophy   . Depression   . History of testicular cancer   . Narcotic dependence     chronic  . Benzodiazepine dependence     chronic  . Anxiety   . CHF (congestive heart failure)   . DJD (degenerative joint disease)    BP 126/88  Pulse 78  Resp 16  Ht 6' 1"  (1.854 m)  Wt 243 lb (110.224 kg)  BMI 32.06 kg/m2      Review of Systems  Constitutional: Negative.   HENT: Negative.   Eyes: Negative.   Respiratory: Positive for apnea, cough, shortness of breath and wheezing.   Gastrointestinal: Positive for constipation.  Genitourinary: Positive for urgency.  Musculoskeletal: Positive for back pain, joint swelling and gait problem.  Skin: Negative.   Neurological: Positive for  numbness.  Hematological: Negative.   Psychiatric/Behavioral: Negative.        Objective:   Physical Exam  Constitutional: He is oriented to person, place, and time. He appears well-developed.       Walks with walker  HENT:  Head: Normocephalic.  Eyes: Pupils are equal, round, and reactive to light.  Neck: Normal range of motion.  Neurological: He is alert and oriented to person, place, and time. He has normal reflexes.  Skin: Skin is warm and dry.  Psychiatric: He has a normal mood and affect.  Symmetric normal motor tone is noted throughout. Normal muscle bulk. Muscle testing reveals 5/5 muscle strength of the upper extremity, and 5/5 of the lower extremity, except right iliopsoas 4/5, and left tibialis anterior 4-/5. Full range of motion in upper and lower extremities. ROM  of spine is  Restricted in extension. Fine motor movements are normal in both hands. Sensory is intact and symmetric to light touch, pinprick and proprioception. DTR in the upper and lower extremity are present and symmetric 2+. No clonus is noted.  Patient arises from chair with slight difficulty. Narrow based gait with a walker, forward flexed spine.            Assessment & Plan:  1. Lumbar spinal stenosis with neurogenic claudication. He would benefit from decompressive surgery however the patient has coronary artery disease as well as cardiomyopathy. He therefore is no candidate for surgery at this point. He is reportedly on the heart transplant list at [facility]. He recently underwent angioplasty and stenting of 2 vessels. He plans to talk to his cardiologist at [facility] once again about his surgical risk.  Will continue his current pain medications. No signs of aberrant drug behavior. No recent urine drug screen so will repeat for monitoring purposes. Refilled his medication, and reordered his physical therapy. Follow up in one month with PA.

## 2011-12-04 NOTE — Patient Instructions (Signed)
Continue with medication. Continue with PT.

## 2011-12-10 ENCOUNTER — Ambulatory Visit: Payer: BC Managed Care – PPO | Admitting: Physical Medicine & Rehabilitation

## 2011-12-18 ENCOUNTER — Ambulatory Visit (INDEPENDENT_AMBULATORY_CARE_PROVIDER_SITE_OTHER): Payer: BC Managed Care – PPO | Admitting: Internal Medicine

## 2011-12-18 ENCOUNTER — Encounter: Payer: Self-pay | Admitting: Internal Medicine

## 2011-12-18 VITALS — BP 120/80 | HR 75 | Ht 73.0 in | Wt 250.0 lb

## 2011-12-18 DIAGNOSIS — I5042 Chronic combined systolic (congestive) and diastolic (congestive) heart failure: Secondary | ICD-10-CM

## 2011-12-18 DIAGNOSIS — Z95810 Presence of automatic (implantable) cardiac defibrillator: Secondary | ICD-10-CM

## 2011-12-18 DIAGNOSIS — I2589 Other forms of chronic ischemic heart disease: Secondary | ICD-10-CM

## 2011-12-18 DIAGNOSIS — F17200 Nicotine dependence, unspecified, uncomplicated: Secondary | ICD-10-CM

## 2011-12-18 DIAGNOSIS — Z720 Tobacco use: Secondary | ICD-10-CM

## 2011-12-18 LAB — ICD DEVICE OBSERVATION
AL AMPLITUDE: 3.7 mv
AL IMPEDENCE ICD: 412.5 Ohm
AL THRESHOLD: 0.375 V
ATRIAL PACING ICD: 0.37 pct
BAMS-0001: 180 {beats}/min
BAMS-0003: 70 {beats}/min
DEV-0020ICD: NEGATIVE
DEVICE MODEL ICD: 815096
FVT: 0
HV IMPEDENCE: 74 Ohm
MODE SWITCH EPISODES: 0
PACEART VT: 2
RV LEAD AMPLITUDE: 12 mv
RV LEAD IMPEDENCE ICD: 350 Ohm
RV LEAD THRESHOLD: 1.25 V
TOT-0006: 20120201000000
TOT-0007: 2
TOT-0008: 0
TOT-0009: 1
TOT-0010: 6
TZAT-0001FASTVT: 1
TZAT-0004FASTVT: 8
TZAT-0012FASTVT: 200 ms
TZAT-0013FASTVT: 3
TZAT-0018FASTVT: NEGATIVE
TZAT-0019FASTVT: 7.5 V
TZAT-0020FASTVT: 1 ms
TZON-0003FASTVT: 340 ms
TZON-0003SLOWVT: 400 ms
TZON-0004FASTVT: 35
TZON-0004SLOWVT: 35
TZON-0005FASTVT: 6
TZON-0005SLOWVT: 6
TZON-0010FASTVT: 40 ms
TZON-0010SLOWVT: 40 ms
TZST-0001FASTVT: 2
TZST-0001FASTVT: 3
TZST-0001FASTVT: 4
TZST-0003FASTVT: 30 J
TZST-0003FASTVT: 40 J
TZST-0003FASTVT: 40 J
VENTRICULAR PACING ICD: 0.16 pct
VF: 2

## 2011-12-18 NOTE — Assessment & Plan Note (Signed)
Discussed a number of strategies.. I asked that he contact his psychiatrist for psychiatric history prior to the initiation of this drug. In addition, we talked about the use of lozenges and patches as well as E. cigarettes

## 2011-12-18 NOTE — Patient Instructions (Addendum)
Your physician recommends that you schedule a follow-up appointment in: 3 months with Kristin/ Nevin Bloodgood for a device check.  Your physician recommends that you continue on your current medications as directed. Please refer to the Current Medication list given to you today.

## 2011-12-18 NOTE — Assessment & Plan Note (Signed)
The patient's device was interrogated.  The information was reviewed. No changes were made in the programming.    

## 2011-12-18 NOTE — Progress Notes (Signed)
HPI  [name] is a 51 y.o. male Seen in followup for ICD implantation in the setting of ischemic cardiomyopathy with ejection fraction of 25% range not amenable to revascularization. He also had complex ventricular ectopy. He is part of the analyze  ST trial.    He also has history of tobacco use, obesity, OSA, depression, chronic back pain on high-dose narcotics  Cath showed (2/11):  LM: ostial 20%  LAD: Diffuse 40% prox, mid 60-70% focal lesion. D1: 30% tubular lesion.  LCX: gave off a ramus branch, small OM-1. The distal AV groove circ was subtotally occluded which was chronic. In the ramus branch, there was evidence of a previously placed stent. This was now totally occluded. In the OM-1, there was a 40% proximal lesion.  RCA: dominant vessel, had diffuse 40-50% disease throughout. In the distal RCA, prior to the PDA, there was a tubular 40% lesion. In the distal RCA just after the takeoff of the PDA, there was a 99% lesion with a near subtotal occlusion. In the PDA, there was a 50% tubular lesion distally.  LV 20%. Failed PCI of distal RCA.   He has recently been followed at [facility] where in April he underwent revascularization with stenting.DES  He has had no subsequent chest pain.   He expresses concern about sexual function and a vertiginous sensation associated with it. He also wonders what point he can take nitroglycerin following the use of Viagra. He inquires regarding Chantix therapy   Past Medical History  Diagnosis Date  . Chronic systolic heart failure     EF 20-25%. s/p ST. Jude ICD  . CAD (coronary artery disease)     Last cath 2/12. 3-v CAD. Failed PCI of distal RCA  . Chronic back pain     lumbar stenosis  . Benign prostatic hypertrophy   . Depression   . History of testicular cancer   . Narcotic dependence     chronic  . Benzodiazepine dependence     chronic  . Anxiety   . CHF (congestive heart failure)   . DJD (degenerative joint disease)     Past  Surgical History  Procedure Date  . Hernia repair   . Asd repair, sinus venosus   . Testicle surgery     testicular cancer surgery  . Cardiac defibrillator placement 08/2010    Current Outpatient Prescriptions  Medication Sig Dispense Refill  . aspirin 325 MG tablet Take 325 mg by mouth daily.      . carvedilol (COREG) 12.5 MG tablet Take 1.5 tablets (18.75 mg total) by mouth 2 (two) times daily with a meal.  90 tablet  6  . clopidogrel (PLAVIX) 75 MG tablet Take 1 tablet (75 mg total) by mouth daily.  30 tablet  6  . diazepam (VALIUM) 10 MG tablet Take 10 mg by mouth 3 (three) times daily. For muscle spasms      . ezetimibe (ZETIA) 10 MG tablet Take 10 mg by mouth daily.      Marland Kitchen gabapentin (NEURONTIN) 600 MG tablet Take 800 mg by mouth 4 (four) times daily.       Marland Kitchen lactulose (CHRONULAC) 10 GM/15ML solution Take 20 g by mouth daily as needed. For constipation      . lisinopril (PRINIVIL,ZESTRIL) 5 MG tablet Take 5 mg by mouth daily.      Marland Kitchen LORazepam (ATIVAN) 1 MG tablet Take 1 mg by mouth every 6 (six) hours as needed. For anxiety      .  morphine (MS CONTIN) 100 MG 12 hr tablet Take 1 tablet (100 mg total) by mouth 3 (three) times daily.  90 tablet  0  . morphine (MSIR) 30 MG tablet Take 1 tablet (30 mg total) by mouth every 6 (six) hours as needed. For pain  120 tablet  0  . nitroGLYCERIN (NITROSTAT) 0.4 MG SL tablet Place 0.4 mg under the tongue every 5 (five) minutes as needed. For chest pain'       . ondansetron (ZOFRAN) 8 MG tablet Take by mouth every 12 (twelve) hours as needed. For nausea      . rosuvastatin (CRESTOR) 40 MG tablet Take 1 tablet (40 mg total) by mouth daily.  30 tablet  6  . spironolactone (ALDACTONE) 25 MG tablet Take 25 mg by mouth daily.        Marland Kitchen testosterone cypionate (DEPOTESTOTERONE CYPIONATE) 200 MG/ML injection Inject into the muscle every 14 (fourteen) days.        Marland Kitchen torsemide (DEMADEX) 20 MG tablet Take 20 mg by mouth daily as needed. For swelling in legs       . zolpidem (AMBIEN) 10 MG tablet Take 10 mg by mouth at bedtime as needed. For sleep      . Zolpidem Tartrate 3.5 MG SUBL Place 3.5 mg under the tongue at bedtime as needed. For sleep      . nystatin (MYCOSTATIN) 100000 UNIT/ML suspension Take 500,000 Units by mouth 4 (four) times daily.        Allergies  Allergen Reactions  . Nalbuphine   . Nubain (Nalbuphine Hcl)   . Propoxyphene And Methadone Other (See Comments)    THROAT CLOSES UP    Review of Systems negative except from HPI and PMH  Physical Exam BP 120/80  Pulse 75  Ht 6' 1"  (1.854 m)  Wt 250 lb (113.399 kg)  BMI 32.98 kg/m2 Well developed and well nourished in no acute distress he has this odor of cigarettes about him HENT normal E scleral and icterus clear Neck Supple JVP flat; carotids brisk and full Clear to ausculation Regular rate and rhythm, no murmurs gallops or rub Soft with active bowel sounds No clubbing cyanosis none Edema Alert and oriented, grossly normal motor and sensory function Skin Warm and Dry    Assessment and  Plan

## 2011-12-18 NOTE — Assessment & Plan Note (Signed)
Status post intercurrent revascularization. We reviewed the issues of nitroglycerin following the use of Viagra. He was tld that he should wait 24 hours prior to the use of nitroglycerin.

## 2012-01-01 ENCOUNTER — Encounter: Payer: Self-pay | Admitting: Physical Medicine and Rehabilitation

## 2012-01-01 ENCOUNTER — Encounter
Payer: BC Managed Care – PPO | Attending: Physical Medicine & Rehabilitation | Admitting: Physical Medicine and Rehabilitation

## 2012-01-01 DIAGNOSIS — M48061 Spinal stenosis, lumbar region without neurogenic claudication: Secondary | ICD-10-CM

## 2012-01-01 DIAGNOSIS — I5022 Chronic systolic (congestive) heart failure: Secondary | ICD-10-CM | POA: Insufficient documentation

## 2012-01-01 DIAGNOSIS — F17200 Nicotine dependence, unspecified, uncomplicated: Secondary | ICD-10-CM | POA: Insufficient documentation

## 2012-01-01 DIAGNOSIS — Z79899 Other long term (current) drug therapy: Secondary | ICD-10-CM | POA: Insufficient documentation

## 2012-01-01 DIAGNOSIS — M79609 Pain in unspecified limb: Secondary | ICD-10-CM | POA: Insufficient documentation

## 2012-01-01 DIAGNOSIS — Z8547 Personal history of malignant neoplasm of testis: Secondary | ICD-10-CM | POA: Insufficient documentation

## 2012-01-01 DIAGNOSIS — M545 Low back pain, unspecified: Secondary | ICD-10-CM | POA: Insufficient documentation

## 2012-01-01 DIAGNOSIS — I2510 Atherosclerotic heart disease of native coronary artery without angina pectoris: Secondary | ICD-10-CM | POA: Insufficient documentation

## 2012-01-01 DIAGNOSIS — I428 Other cardiomyopathies: Secondary | ICD-10-CM | POA: Insufficient documentation

## 2012-01-01 DIAGNOSIS — M47817 Spondylosis without myelopathy or radiculopathy, lumbosacral region: Secondary | ICD-10-CM | POA: Insufficient documentation

## 2012-01-01 MED ORDER — MORPHINE SULFATE 30 MG PO TABS: 30.0000 mg | ORAL_TABLET | Freq: Four times a day (QID) | ORAL | Status: DC | PRN

## 2012-01-01 MED ORDER — MORPHINE SULFATE ER 100 MG PO TBCR: 100.0000 mg | EXTENDED_RELEASE_TABLET | Freq: Three times a day (TID) | ORAL | Status: DC

## 2012-01-01 NOTE — Progress Notes (Signed)
Subjective:    Patient ID: ANIAS BARTOL, male    DOB: [birth year], 51 y.o.   MRN: [id number]  HPI The patient is a 51 year old male, who presents with severe spinal spondylosis and HF . The patient complains about moderate to severe pain in his lower back and LE bilateral . Taking his pain medications , and being in a flexed position alleviate the symptoms. Prolonged standing or walking aggrevates the symptoms. The patient grades his pain as a 7 /10. The patient reports that he has not started physical therapy yet, because his father is severely ill.   Pain Inventory Average Pain 8 Pain Right Now 8 My pain is constant, sharp, burning, stabbing and parasthesia  In the last 24 hours, has pain interfered with the following? General activity 7 Relation with others 7 Enjoyment of life 7 What TIME of day is your pain at its worst? daytime Sleep (in general) Poor  Pain is worse with: walking and bending Pain improves with: medication Relief from Meds: 5  Mobility walk with assistance use a walker how many minutes can you walk? 4-5 ability to climb steps?  yes do you drive?  yes needs help with transfers  Function employed # of hrs/week 20  Neuro/Psych bladder control problems bowel control problems weakness numbness trouble walking spasms anxiety  Prior Studies Any changes since last visit?  no  Physicians involved in your care Any changes since last visit?  no   Family History  Problem Relation Age of Onset  . Heart disease Father 44    Died of MI   History   Social History  . Marital Status: Married    Spouse Name: N/A    Number of Children: N/A  . Years of Education: N/A   Social History Main Topics  . Smoking status: Current Everyday Smoker -- 0.3 packs/day for 25 years  . Smokeless tobacco: Current User  . Alcohol Use: No  . Drug Use: Yes    Special: Marijuana     Urine showed THC  . Sexually Active: None   Other Topics Concern  . None   Social  History Narrative  . None   Past Surgical History  Procedure Date  . Hernia repair   . Asd repair, sinus venosus   . Testicle surgery     testicular cancer surgery  . Cardiac defibrillator placement 08/2010   Past Medical History  Diagnosis Date  . Chronic systolic heart failure     EF 20-25%. s/p ST. Jude ICD  . CAD (coronary artery disease)     Last cath 2/12. 3-v CAD. Failed PCI of distal RCAc  CATH [facility] 4/13 with DES to  LAD X2  . Chronic back pain     lumbar stenosis  . Benign prostatic hypertrophy   . Depression   . History of testicular cancer   . Narcotic dependence     chronic  . Benzodiazepine dependence     chronic  . Anxiety   . CHF (congestive heart failure)   . DJD (degenerative joint disease)   . Automatic implantable cardiac defibrillator [employer] Judes     Analyze ST   There were no vitals taken for this visit.    Review of Systems  Musculoskeletal: Positive for back pain and gait problem.       Spasms  Neurological: Positive for numbness.  Psychiatric/Behavioral: The patient is nervous/anxious.   All other systems reviewed and are negative.  Objective:   Physical Exam Constitutional: He is oriented to person, place, and time. He appears well-developed.  Walks with walker  HENT:  Head: Normocephalic.  Eyes: Pupils are equal, round, and reactive to light.  Neck: Normal range of motion.  Neurological: He is alert and oriented to person, place, and time. He has normal reflexes.  Skin: Skin is warm and dry.  Psychiatric: He has a normal mood and affect.  Symmetric normal motor tone is noted throughout. Normal muscle bulk. Muscle testing reveals 5/5 muscle strength of the upper extremity, and 5/5 of the lower extremity, except right iliopsoas 4/5, and left tibialis anterior 4-/5. Full range of motion in upper and lower extremities. ROM of spine is Restricted in extension. Fine motor movements are normal in both hands.  Sensory is intact and  symmetric to light touch, pinprick and proprioception.  DTR in the upper and lower extremity are present and symmetric 2+. No clonus is noted.  Patient arises from chair with slight difficulty. Narrow based gait with a walker, forward flexed spine.         Assessment & Plan:  1. Lumbar spinal stenosis with neurogenic claudication. He would benefit from decompressive surgery however the patient has coronary artery disease as well as cardiomyopathy. He therefore is no candidate for surgery at this point. He is reportedly on the heart transplant list at [facility]. He recently underwent angioplasty and stenting of 2 vessels. He plans to talk to his cardiologist at [facility] once again about his surgical risk.  Will continue his current pain medications. No signs of aberrant drug behavior. No recent urine drug screen so will repeat for monitoring purposes. Refilled his medication, patient will restart his physical therapy, could not start yet, because his father is severely ill.   Follow up in one month with PA.

## 2012-01-01 NOTE — Patient Instructions (Signed)
Restart PT, continue with medication

## 2012-01-12 ENCOUNTER — Encounter: Payer: Self-pay | Admitting: Internal Medicine

## 2012-02-01 ENCOUNTER — Encounter
Payer: BC Managed Care – PPO | Attending: Physical Medicine and Rehabilitation | Admitting: Physical Medicine and Rehabilitation

## 2012-02-01 ENCOUNTER — Ambulatory Visit: Payer: BC Managed Care – PPO | Admitting: Physical Medicine and Rehabilitation

## 2012-02-01 ENCOUNTER — Encounter: Payer: Self-pay | Admitting: Physical Medicine and Rehabilitation

## 2012-02-01 VITALS — BP 118/85 | HR 88 | Resp 18 | Ht 72.0 in | Wt 245.0 lb

## 2012-02-01 DIAGNOSIS — I739 Peripheral vascular disease, unspecified: Secondary | ICD-10-CM | POA: Insufficient documentation

## 2012-02-01 DIAGNOSIS — M479 Spondylosis, unspecified: Secondary | ICD-10-CM | POA: Insufficient documentation

## 2012-02-01 DIAGNOSIS — M79609 Pain in unspecified limb: Secondary | ICD-10-CM | POA: Insufficient documentation

## 2012-02-01 DIAGNOSIS — M545 Low back pain, unspecified: Secondary | ICD-10-CM | POA: Insufficient documentation

## 2012-02-01 DIAGNOSIS — M48061 Spinal stenosis, lumbar region without neurogenic claudication: Secondary | ICD-10-CM

## 2012-02-01 MED ORDER — DICLOFENAC EPOLAMINE 1.3 % TD PTCH: 1.0000 | MEDICATED_PATCH | Freq: Two times a day (BID) | TRANSDERMAL | Status: DC

## 2012-02-01 MED ORDER — MORPHINE SULFATE 30 MG PO TABS: 30.0000 mg | ORAL_TABLET | Freq: Four times a day (QID) | ORAL | Status: DC | PRN

## 2012-02-01 MED ORDER — MORPHINE SULFATE ER 100 MG PO TBCR: 100.0000 mg | EXTENDED_RELEASE_TABLET | Freq: Three times a day (TID) | ORAL | Status: DC

## 2012-02-01 NOTE — Progress Notes (Signed)
Subjective:    Patient ID: [name], male    DOB: Oct 27, 1960, 51 y.o.   MRN: [id number]  HPI The patient is a 51 year old male, who presents with severe spinal spondylosis and HF . The patient complains about moderate to severe pain in his lower back and LE bilateral . Taking his pain medications , and being in a flexed position alleviate the symptoms. Prolonged standing or walking aggrevates the symptoms. The patient grades his pain as a 7 /10. The patient reports that he has not restarted physical therapy yet, because his father is severely ill. He states, that he has increased back pain after prolonged sitting or walking.  Pain Inventory Average Pain 8 Pain Right Now 8 My pain is sharp, burning, dull and stabbing  In the last 24 hours, has pain interfered with the following? General activity 8 Relation with others 8 Enjoyment of life 8 What TIME of day is your pain at its worst? Morning, Daytime and Evening Sleep (in general) Fair  Pain is worse with: walking, bending and standing Pain improves with: rest, therapy/exercise and medication Relief from Meds: 5  Mobility walk with assistance use a walker  Function employed # of hrs/week 20  Neuro/Psych numbness trouble walking anxiety  Prior Studies Any changes since last visit?  no  Physicians involved in your care Any changes since last visit?  no   Family History  Problem Relation Age of Onset  . Heart disease Father 35    Died of MI   History   Social History  . Marital Status: Married    Spouse Name: N/A    Number of Children: N/A  . Years of Education: N/A   Social History Main Topics  . Smoking status: Current Everyday Smoker -- 0.3 packs/day for 25 years  . Smokeless tobacco: Current User  . Alcohol Use: No  . Drug Use: Yes    Special: Marijuana     Urine showed THC  . Sexually Active: None   Other Topics Concern  . None   Social History Narrative  . None   Past Surgical History    Procedure Date  . Hernia repair   . Asd repair, sinus venosus   . Testicle surgery     testicular cancer surgery  . Cardiac defibrillator placement 08/2010   Past Medical History  Diagnosis Date  . Chronic systolic heart failure     EF 11-91%. s/p ST. Jude ICD  . CAD (coronary artery disease)     Last cath 2/12. 3-v CAD. Failed PCI of distal RCAc  CATH [facility] 4/13 with DES to  LAD X2  . Chronic back pain     lumbar stenosis  . Benign prostatic hypertrophy   . Depression   . History of testicular cancer   . Narcotic dependence     chronic  . Benzodiazepine dependence     chronic  . Anxiety   . CHF (congestive heart failure)   . DJD (degenerative joint disease)   . Automatic implantable cardiac defibrillator [employer] Judes     Analyze ST   BP 118/85  Pulse 88  Resp 18  Ht 6' (1.829 m)  Wt 245 lb (111.131 kg)  BMI 33.23 kg/m2  SpO2 99%      Review of Systems  Constitutional: Negative.   HENT: Negative.   Eyes: Negative.   Respiratory: Negative.   Cardiovascular: Negative.   Gastrointestinal: Negative.   Genitourinary: Negative.   Musculoskeletal: Positive for back  pain and gait problem.  Skin: Negative.   Neurological: Positive for numbness.  Hematological: Negative.   Psychiatric/Behavioral: Negative.        Objective:   Physical Exam Constitutional: He is oriented to person, place, and time. He appears well-developed.  Walks with walker  HENT:  Head: Normocephalic.  Eyes: Pupils are equal, round, and reactive to light.  Neck: Normal range of motion.  Neurological: He is alert and oriented to person, place, and time. He has normal reflexes.  Skin: Skin is warm and dry.  Psychiatric: He has a normal mood and affect.  Symmetric normal motor tone is noted throughout. Normal muscle bulk. Muscle testing reveals 5/5 muscle strength of the upper extremity, and 5/5 of the lower extremity, except right iliopsoas 4/5, and left tibialis anterior 4-/5. Full range of  motion in upper and lower extremities. ROM of spine is Restricted in extension. Fine motor movements are normal in both hands.  Sensory is intact and symmetric to light touch, pinprick and proprioception.  DTR in the upper and lower extremity are present and symmetric 2+. No clonus is noted.  Patient arises from chair with slight difficulty. Narrow based gait with a walker, forward flexed spine.  Tenderness paraspinal muscles in L-spine.       Assessment & Plan:  1. Lumbar spinal stenosis with neurogenic claudication. He would benefit from decompressive surgery however the patient has coronary artery disease as well as cardiomyopathy. He therefore is no candidate for surgery at this point. He is reportedly on the heart transplant list at [facility]. He recently underwent angioplasty and stenting of 2 vessels. He plans to talk to his cardiologist at [facility] once again about his surgical risk.  Will continue his current pain medications. No signs of aberrant drug behavior. Refilled his medication, patient will restart his physical therapy, could not start yet, because his father is severely ill. Prescribed Flector patches for muscle strain /exacerbation after prolonged walking or sitting.  Follow up in one month with PA.

## 2012-02-01 NOTE — Patient Instructions (Signed)
Restart with PT.

## 2012-02-03 NOTE — Progress Notes (Signed)
Please check UDS next visit AK MD

## 2012-02-05 ENCOUNTER — Telehealth: Payer: Self-pay | Admitting: *Deleted

## 2012-02-05 NOTE — Telephone Encounter (Signed)
We received a fax from his insurance company and they are denying coverage on Flector Patches. Any alternatives you would like him to try in place of this? Thanks.

## 2012-02-05 NOTE — Telephone Encounter (Signed)
No that was just for some exacerbation or muscle strain after prolonged sitting or standing, did they send a paper to appeal , usually if we fill out that he has a muscle strain they pay for it, please try to make sure that he has had a muscle strain in his low back after traveling .

## 2012-02-08 ENCOUNTER — Ambulatory Visit (HOSPITAL_COMMUNITY): Admission: RE | Admit: 2012-02-08 | Payer: BC Managed Care – PPO | Source: Ambulatory Visit | Admitting: Oral Surgery

## 2012-02-08 ENCOUNTER — Encounter (HOSPITAL_COMMUNITY): Admission: RE | Payer: Self-pay | Source: Ambulatory Visit

## 2012-02-08 SURGERY — DENTAL RESTORATION/EXTRACTIONS
Anesthesia: General

## 2012-02-25 ENCOUNTER — Ambulatory Visit: Payer: BC Managed Care – PPO | Admitting: Physical Therapy

## 2012-02-26 ENCOUNTER — Telehealth (HOSPITAL_COMMUNITY): Payer: Self-pay | Admitting: Cardiology

## 2012-02-26 MED ORDER — ALFUZOSIN HCL ER 10 MG PO TB24: 10.0000 mg | ORAL_TABLET | Freq: Every day | ORAL | Status: AC

## 2012-02-26 NOTE — Telephone Encounter (Signed)
Ok per Dr Gala Romney quantity of 10 sent to pharmacy

## 2012-02-26 NOTE — Telephone Encounter (Signed)
Pt is requesting a quick/limited refill of his Uroxatral 10 mg # 5. This will help him out until he can get in to see his Urologist. Thanks  [employer]

## 2012-03-01 ENCOUNTER — Ambulatory Visit (HOSPITAL_BASED_OUTPATIENT_CLINIC_OR_DEPARTMENT_OTHER): Payer: BC Managed Care – PPO | Admitting: Physical Medicine & Rehabilitation

## 2012-03-01 ENCOUNTER — Encounter: Payer: Self-pay | Admitting: Physical Medicine & Rehabilitation

## 2012-03-01 ENCOUNTER — Encounter: Payer: BC Managed Care – PPO | Attending: Physical Medicine & Rehabilitation

## 2012-03-01 VITALS — BP 111/74 | HR 64 | Resp 14 | Ht 72.0 in | Wt 253.0 lb

## 2012-03-01 DIAGNOSIS — M545 Low back pain, unspecified: Secondary | ICD-10-CM | POA: Insufficient documentation

## 2012-03-01 DIAGNOSIS — M48062 Spinal stenosis, lumbar region with neurogenic claudication: Secondary | ICD-10-CM | POA: Insufficient documentation

## 2012-03-01 DIAGNOSIS — M79609 Pain in unspecified limb: Secondary | ICD-10-CM | POA: Insufficient documentation

## 2012-03-01 DIAGNOSIS — M479 Spondylosis, unspecified: Secondary | ICD-10-CM | POA: Insufficient documentation

## 2012-03-01 DIAGNOSIS — I2510 Atherosclerotic heart disease of native coronary artery without angina pectoris: Secondary | ICD-10-CM | POA: Insufficient documentation

## 2012-03-01 DIAGNOSIS — I428 Other cardiomyopathies: Secondary | ICD-10-CM | POA: Insufficient documentation

## 2012-03-01 MED ORDER — MORPHINE SULFATE 30 MG PO TABS: 30.0000 mg | ORAL_TABLET | Freq: Four times a day (QID) | ORAL | Status: DC | PRN

## 2012-03-01 MED ORDER — MORPHINE SULFATE ER 100 MG PO TBCR: 100.0000 mg | EXTENDED_RELEASE_TABLET | Freq: Three times a day (TID) | ORAL | Status: DC

## 2012-03-01 NOTE — Progress Notes (Deleted)
  Subjective:    Patient ID: [name], male    DOB: 05-15-1961, 51 y.o.   MRN: [id number]  HPI    Review of Systems     Objective:   Physical Exam        Assessment & Plan:

## 2012-03-01 NOTE — Patient Instructions (Signed)
Continue current medications Physical therapy may be helpful for leg strengthening

## 2012-03-01 NOTE — Progress Notes (Signed)
Subjective:    Patient ID: [name], male    DOB: Apr 02, 1961, 51 y.o.   MRN: [id number]  HPI  Painno new medical issues.claudication symptoms start at 40 or 50 yards with lower extremity pain and weakness as well as perineal numbness. This subsides with sitting. No urinary incontinence No complications from medications or side effects that are not tolerated. Inventory Average Pain 8 Pain Right Now 8 My pain is sharp, burning and stabbing  In the last 24 hours, has pain interfered with the following? General activity 7 Relation with others 7 Enjoyment of life 7 What TIME of day is your pain at its worst? morning Sleep (in general) Poor  Pain is worse with: walking, bending and standing Pain improves with: medication Relief from Meds: 6  Mobility use a walker ability to climb steps?  yes do you drive?  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  no  Physicians involved in your care Any changes since last visit?  no   Family History  Problem Relation Age of Onset  . Heart disease Father 78    Died of MI   History   Social History  . Marital Status: Married    Spouse Name: N/A    Number of Children: N/A  . Years of Education: N/A   Social History Main Topics  . Smoking status: Current Everyday Smoker -- 0.3 packs/day for 25 years  . Smokeless tobacco: Current User  . Alcohol Use: No  . Drug Use: Yes    Special: Marijuana     Urine showed THC  . Sexually Active: None   Other Topics Concern  . None   Social History Narrative  . None   Past Surgical History  Procedure Date  . Hernia repair   . Asd repair, sinus venosus   . Testicle surgery     testicular cancer surgery  . Cardiac defibrillator placement 08/2010   Past Medical History  Diagnosis Date  . Chronic systolic heart  failure     EF 20-25%. s/p ST. Jude ICD  . CAD (coronary artery disease)     Last cath 2/12. 3-v CAD. Failed PCI of distal RCAc  CATH [facility] 4/13 with DES to  LAD X2  . Chronic back pain     lumbar stenosis  . Benign prostatic hypertrophy   . Depression   . History of testicular cancer   . Narcotic dependence     chronic  . Benzodiazepine dependence     chronic  . Anxiety   . CHF (congestive heart failure)   . DJD (degenerative joint disease)   . Automatic implantable cardiac defibrillator [employer] Judes     Analyze ST   BP 111/74  Pulse 64  Resp 14  Ht 6' (1.829 m)  Wt 253 lb (114.76 kg)  BMI 34.31 kg/m2  SpO2 89%     Review of Systems  Musculoskeletal: Positive for myalgias, back pain and arthralgias.  All other systems reviewed and are negative.       Objective:   Physical Exam  Motor strength is 5/5 in bilateral upper lobes remedies with exception that the right plantar flexor is slightly weaker than the left Calf circumference is equivalent at 42 cm 10 cm below the tibial tuberosity. There is no evidence of fasciculations Sensation is reduced  on the right foot below the ankle and on the left foot it goes up to mid calf. Mood and affect are appropriate Gait is/. No assisted device      Assessment & Plan:  Lumbar spinal stenosis with neurogenic claudication. No progression. He is considering surgery but the earliest that can be done would be one year after her drug eluding stents have been placed. That would be in April of 2014

## 2012-03-03 ENCOUNTER — Ambulatory Visit: Payer: BC Managed Care – PPO | Attending: Family Medicine | Admitting: Physical Therapy

## 2012-03-07 ENCOUNTER — Ambulatory Visit: Payer: Self-pay | Admitting: Physical Medicine and Rehabilitation

## 2012-03-08 ENCOUNTER — Encounter: Payer: Self-pay | Admitting: *Deleted

## 2012-03-24 ENCOUNTER — Ambulatory Visit (INDEPENDENT_AMBULATORY_CARE_PROVIDER_SITE_OTHER): Payer: BC Managed Care – PPO | Admitting: *Deleted

## 2012-03-24 DIAGNOSIS — Z95810 Presence of automatic (implantable) cardiac defibrillator: Secondary | ICD-10-CM

## 2012-03-24 DIAGNOSIS — I2589 Other forms of chronic ischemic heart disease: Secondary | ICD-10-CM

## 2012-03-24 LAB — REMOTE ICD DEVICE
AL AMPLITUDE: 3.3 mv
AL IMPEDENCE ICD: 450 Ohm
AL THRESHOLD: 0.375 V
ATRIAL PACING ICD: 1 pct
BAMS-0001: 180 {beats}/min
BAMS-0003: 70 {beats}/min
DEV-0020ICD: NEGATIVE
DEVICE MODEL ICD: 815096
HV IMPEDENCE: 79 Ohm
RV LEAD AMPLITUDE: 12 mv
RV LEAD IMPEDENCE ICD: 390 Ohm
RV LEAD THRESHOLD: 1.125 V
TZAT-0001FASTVT: 1
TZAT-0004FASTVT: 8
TZAT-0012FASTVT: 200 ms
TZAT-0013FASTVT: 3
TZAT-0018FASTVT: NEGATIVE
TZAT-0019FASTVT: 7.5 V
TZAT-0020FASTVT: 1 ms
TZON-0003FASTVT: 340 ms
TZON-0003SLOWVT: 400 ms
TZON-0004FASTVT: 35
TZON-0004SLOWVT: 35
TZON-0005FASTVT: 6
TZON-0005SLOWVT: 6
TZON-0010FASTVT: 40 ms
TZON-0010SLOWVT: 40 ms
TZST-0001FASTVT: 2
TZST-0001FASTVT: 3
TZST-0001FASTVT: 4
TZST-0003FASTVT: 30 J
TZST-0003FASTVT: 40 J
TZST-0003FASTVT: 40 J
VENTRICULAR PACING ICD: 1 pct

## 2012-03-30 ENCOUNTER — Encounter: Payer: Self-pay | Admitting: Physical Medicine and Rehabilitation

## 2012-03-30 ENCOUNTER — Encounter: Payer: Self-pay | Admitting: Internal Medicine

## 2012-03-30 ENCOUNTER — Encounter
Payer: BC Managed Care – PPO | Attending: Physical Medicine and Rehabilitation | Admitting: Physical Medicine and Rehabilitation

## 2012-03-30 ENCOUNTER — Telehealth: Payer: Self-pay | Admitting: Physical Medicine and Rehabilitation

## 2012-03-30 VITALS — BP 139/83 | HR 90 | Resp 14 | Ht 70.0 in | Wt 247.0 lb

## 2012-03-30 DIAGNOSIS — I2510 Atherosclerotic heart disease of native coronary artery without angina pectoris: Secondary | ICD-10-CM | POA: Insufficient documentation

## 2012-03-30 DIAGNOSIS — Z5181 Encounter for therapeutic drug level monitoring: Secondary | ICD-10-CM

## 2012-03-30 DIAGNOSIS — M48062 Spinal stenosis, lumbar region with neurogenic claudication: Secondary | ICD-10-CM | POA: Insufficient documentation

## 2012-03-30 DIAGNOSIS — M4800 Spinal stenosis, site unspecified: Secondary | ICD-10-CM

## 2012-03-30 DIAGNOSIS — I428 Other cardiomyopathies: Secondary | ICD-10-CM | POA: Insufficient documentation

## 2012-03-30 MED ORDER — MORPHINE SULFATE 30 MG PO TABS: 30.0000 mg | ORAL_TABLET | Freq: Four times a day (QID) | ORAL | Status: DC | PRN

## 2012-03-30 MED ORDER — MORPHINE SULFATE ER 100 MG PO TBCR: 100.0000 mg | EXTENDED_RELEASE_TABLET | Freq: Three times a day (TID) | ORAL | Status: DC

## 2012-03-30 NOTE — Telephone Encounter (Signed)
Ok will do.

## 2012-03-30 NOTE — Addendum Note (Signed)
Addended by: Su Monks on: 03/30/2012 11:29 AM   Modules accepted: Orders

## 2012-03-30 NOTE — Telephone Encounter (Signed)
Please order

## 2012-03-30 NOTE — Telephone Encounter (Signed)
Patient would like a referral to a neurosurgeon.

## 2012-03-30 NOTE — Progress Notes (Signed)
Subjective:    Patient ID: [name], male    DOB: 03/25/1961, 51 y.o.   MRN: [id number]  HPI The patient is a 51 year old male, who presents with severe spinal spondylosis and HF . The patient complains about moderate to severe pain in his lower back and LE bilateral . Taking his pain medications , and being in a flexed position alleviate the symptoms. Prolonged standing or walking aggrevates the symptoms. The patient grades his pain as a 7 /10. The patient reports that he has not restarted physical therapy yet, because the order went to another office, not to the one where he was doing it before, we will set him up at the right office, his order should still be valid.   Pain Inventory Average Pain 8 Pain Right Now 8 My pain is sharp and burning  In the last 24 hours, has pain interfered with the following? General activity 8 Relation with others 8 Enjoyment of life 8 What TIME of day is your pain at its worst? daytime Sleep (in general) Fair  Pain is worse with: walking, bending, sitting and some activites Pain improves with: rest, pacing activities and medication Relief from Meds: 7  Mobility walk with assistance use a walker how many minutes can you walk? 5 ability to climb steps?  yes do you drive?  yes  Function employed # of hrs/week ceo   Neuro/Psych bladder control problems weakness numbness trouble walking  Prior Studies Any changes since last visit?  no  Physicians involved in your care Any changes since last visit?  no   Family History  Problem Relation Age of Onset  . Heart disease Father 34    Died of MI   History   Social History  . Marital Status: Married    Spouse Name: N/A    Number of Children: N/A  . Years of Education: N/A   Social History Main Topics  . Smoking status: Current Every Day Smoker -- 0.3 packs/day for 25 years  . Smokeless tobacco: Current User  . Alcohol Use: No  . Drug Use: Yes    Special: Marijuana     Urine  showed THC  . Sexually Active: None   Other Topics Concern  . None   Social History Narrative  . None   Past Surgical History  Procedure Date  . Hernia repair   . Asd repair, sinus venosus   . Testicle surgery     testicular cancer surgery  . Cardiac defibrillator placement 08/2010   Past Medical History  Diagnosis Date  . Chronic systolic heart failure     EF 40-98%. s/p ST. Jude ICD  . CAD (coronary artery disease)     Last cath 2/12. 3-v CAD. Failed PCI of distal RCAc  CATH [facility] 4/13 with DES to  LAD X2  . Chronic back pain     lumbar stenosis  . Benign prostatic hypertrophy   . Depression   . History of testicular cancer   . Narcotic dependence     chronic  . Benzodiazepine dependence     chronic  . Anxiety   . CHF (congestive heart failure)   . DJD (degenerative joint disease)   . Automatic implantable cardiac defibrillator [employer] Judes     Analyze ST   BP 139/83  Pulse 90  Resp 14  Ht 5\' 10"  (1.778 m)  Wt 247 lb (112.038 kg)  BMI 35.44 kg/m2  SpO2 95%      Review  of Systems  Musculoskeletal: Positive for myalgias, arthralgias and gait problem.  Neurological: Positive for weakness and numbness.  All other systems reviewed and are negative.       Objective:   Physical Exam Constitutional: He is oriented to person, place, and time. He appears well-developed.  Walks with walker  HENT:  Head: Normocephalic.  Eyes: Pupils are equal, round, and reactive to light.  Neck: Normal range of motion.  Neurological: He is alert and oriented to person, place, and time. He has normal reflexes.  Skin: Skin is warm and dry.  Psychiatric: He has a normal mood and affect.  Symmetric normal motor tone is noted throughout. Normal muscle bulk. Muscle testing reveals 5/5 muscle strength of the upper extremity, and 5/5 of the lower extremity, except right iliopsoas 4/5, and left tibialis anterior 4-/5. Full range of motion in upper and lower extremities. ROM of spine is  Restricted in extension. Fine motor movements are normal in both hands.  Sensory is intact and symmetric to light touch, pinprick and proprioception.  DTR in the upper and lower extremity are present and symmetric 2+. No clonus is noted.  Patient arises from chair with slight difficulty. Narrow based gait with a walker, forward flexed spine.  Tenderness paraspinal muscles in L-spine.        Assessment & Plan:  1. Lumbar spinal stenosis with neurogenic claudication. He would benefit from decompressive surgery however the patient has coronary artery disease as well as cardiomyopathy. He therefore is no candidate for surgery at this point. He is reportedly on the heart transplant list at [facility]. He recently underwent angioplasty and stenting of 2 vessels. He plans to talk to his cardiologist at [facility] once again about his surgical risk. The earliest they could consider surgery would be in April of next year, one year after he had his medicated stents placed. Will continue his current pain medications. No signs of aberrant drug behavior. Refilled his medication, patient will restart his physical therapy, could not start yet, because his father is severely ill. Prescribed Flector patches for muscle strain /exacerbation after prolonged walking or sitting at last visit, these were not approved by his insurance at this point.  Follow up in one month with PA.

## 2012-03-30 NOTE — Patient Instructions (Signed)
Stay as active as possible

## 2012-03-31 ENCOUNTER — Ambulatory Visit: Payer: BC Managed Care – PPO | Admitting: Physical Medicine and Rehabilitation

## 2012-04-19 ENCOUNTER — Telehealth: Payer: Self-pay | Admitting: Physical Medicine & Rehabilitation

## 2012-04-19 NOTE — Telephone Encounter (Signed)
Please talk to me tomorrow, I do not really know what you want me to do

## 2012-04-19 NOTE — Telephone Encounter (Signed)
Wants referral to Dr Patrice Paradise?  Has already seen Dr Sherwood Gambler.  He will not do surgery due to patients cardiac status.  Please have [name] Glad call.  No one will do surgery in [place].  Cannot come off Plavix for 1 year from when stents were placed.  Please call.

## 2012-04-20 ENCOUNTER — Encounter: Payer: Self-pay | Admitting: *Deleted

## 2012-04-20 NOTE — Telephone Encounter (Signed)
Patient advised there is nothing that can be done until his stent has been in a year.  Advised him he can do some research and see what other options there are at this point.  He will discuss options at next appointment.

## 2012-04-22 ENCOUNTER — Other Ambulatory Visit: Payer: Self-pay | Admitting: Internal Medicine

## 2012-04-26 ENCOUNTER — Encounter: Payer: Self-pay | Admitting: Internal Medicine

## 2012-04-28 ENCOUNTER — Encounter
Payer: BC Managed Care – PPO | Attending: Physical Medicine and Rehabilitation | Admitting: Physical Medicine and Rehabilitation

## 2012-04-28 ENCOUNTER — Encounter: Payer: Self-pay | Admitting: Physical Medicine and Rehabilitation

## 2012-04-28 VITALS — BP 117/60 | HR 83 | Resp 16 | Ht 72.0 in | Wt 248.0 lb

## 2012-04-28 DIAGNOSIS — M545 Low back pain, unspecified: Secondary | ICD-10-CM | POA: Insufficient documentation

## 2012-04-28 DIAGNOSIS — M79609 Pain in unspecified limb: Secondary | ICD-10-CM | POA: Insufficient documentation

## 2012-04-28 DIAGNOSIS — Z5181 Encounter for therapeutic drug level monitoring: Secondary | ICD-10-CM

## 2012-04-28 DIAGNOSIS — M48061 Spinal stenosis, lumbar region without neurogenic claudication: Secondary | ICD-10-CM | POA: Insufficient documentation

## 2012-04-28 DIAGNOSIS — M479 Spondylosis, unspecified: Secondary | ICD-10-CM | POA: Insufficient documentation

## 2012-04-28 MED ORDER — MORPHINE SULFATE 30 MG PO TABS: 30.0000 mg | ORAL_TABLET | Freq: Four times a day (QID) | ORAL | Status: DC | PRN

## 2012-04-28 MED ORDER — MORPHINE SULFATE ER 100 MG PO TBCR: 100.0000 mg | EXTENDED_RELEASE_TABLET | Freq: Three times a day (TID) | ORAL | Status: DC

## 2012-04-28 NOTE — Progress Notes (Signed)
Subjective:    Patient ID: [name], male    DOB: 02-Sep-1960, 51 y.o.   MRN: [id number]  HPI The patient is a 51 year old male, who presents with severe spinal spondylosis and HF . The patient complains about moderate to severe pain in his lower back and LE bilateral . Taking his pain medications , and being in a flexed position alleviate the symptoms. Prolonged standing or walking aggrevates the symptoms. The patient grades his pain as a 7 /10. The patient reports that he has  restarted physical therapy, and would like to continue.  Pain Inventory Average Pain 7 Pain Right Now 9 My pain is sharp, burning and stabbing  In the last 24 hours, has pain interfered with the following? General activity 7 Relation with others 8 Enjoyment of life 9 What TIME of day is your pain at its worst? morning daytime and evening Sleep (in general) Poor  Pain is worse with: walking and standing Pain improves with: rest and medication Relief from Meds: 5  Mobility walk without assistance use a walker do you drive?  yes  Function employed # of hrs/week 30 I need assistance with the following:  dressing, bathing and meal prep  Neuro/Psych bladder control problems numbness trouble walking anxiety  Prior Studies Any changes since last visit?  no  Physicians involved in your care Any changes since last visit?  no   Family History  Problem Relation Age of Onset  . Heart disease Father 66    Died of MI   History   Social History  . Marital Status: Married    Spouse Name: N/A    Number of Children: N/A  . Years of Education: N/A   Social History Main Topics  . Smoking status: Current Every Day Smoker -- 0.3 packs/day for 25 years  . Smokeless tobacco: Current User   Comment: using e cigarette now  . Alcohol Use: No  . Drug Use: Yes    Special: Marijuana     Urine showed THC  . Sexually Active: None   Other Topics Concern  . None   Social History Narrative  . None    Past Surgical History  Procedure Date  . Hernia repair   . Asd repair, sinus venosus   . Testicle surgery     testicular cancer surgery  . Cardiac defibrillator placement 08/2010   Past Medical History  Diagnosis Date  . Chronic systolic heart failure     EF 14-78%. s/p ST. Jude ICD  . CAD (coronary artery disease)     Last cath 2/12. 3-v CAD. Failed PCI of distal RCAc  CATH [facility] 4/13 with DES to  LAD X2  . Chronic back pain     lumbar stenosis  . Benign prostatic hypertrophy   . Depression   . History of testicular cancer   . Narcotic dependence     chronic  . Benzodiazepine dependence     chronic  . Anxiety   . CHF (congestive heart failure)   . DJD (degenerative joint disease)   . Automatic implantable cardiac defibrillator St Judes     Analyze ST   BP 117/60  Pulse 83  Resp 16  Ht 6' (1.829 m)  Wt 248 lb (112.492 kg)  BMI 33.63 kg/m2  SpO2 95%    Review of Systems  Genitourinary: Positive for difficulty urinating.  Musculoskeletal: Positive for back pain and gait problem.  Neurological: Positive for numbness.  Psychiatric/Behavioral: The patient is nervous/anxious.  All other systems reviewed and are negative.       Objective:   Physical Exam Constitutional: He is oriented to person, place, and time. He appears well-developed.  Walks with walker  HENT:  Head: Normocephalic.  Eyes: Pupils are equal, round, and reactive to light.  Neck: Normal range of motion.  Neurological: He is alert and oriented to person, place, and time. He has normal reflexes.  Skin: Skin is warm and dry.  Psychiatric: He has a normal mood and affect.  Symmetric normal motor tone is noted throughout. Normal muscle bulk. Muscle testing reveals 5/5 muscle strength of the upper extremity, and 5/5 of the lower extremity, except right iliopsoas 4/5, and left tibialis anterior 4-/5. Full range of motion in upper and lower extremities. ROM of spine is Restricted in extension. Fine  motor movements are normal in both hands.  Sensory is intact and symmetric to light touch, pinprick and proprioception.  DTR in the upper and lower extremity are present and symmetric 2+. No clonus is noted.  Patient arises from chair with slight difficulty. Narrow based gait with a walker, forward flexed spine.  Tenderness paraspinal muscles in L-spine.        Assessment & Plan:  1. Lumbar spinal stenosis with neurogenic claudication. He would benefit from decompressive surgery however the patient has coronary artery disease as well as cardiomyopathy. He therefore is no candidate for surgery at this point. He is reportedly on the heart transplant list at [facility]. He recently underwent angioplasty and stenting of 2 vessels. He plans to talk to his cardiologist at [facility] once again about his surgical risk. The earliest they could consider surgery would be in April of next year, one year after he had his medicated stents placed.  Will continue his current pain medications. Refilled his medication, patient will continue  his physical therapy, with modalities for pain relief. Prescribed Flector patches for muscle strain /exacerbation after prolonged walking or sitting at last visit, these were not approved by his insurance at this point, will try to get approval.  Follow up in one month with PA.

## 2012-04-28 NOTE — Patient Instructions (Signed)
Continue with PT, continue staying as active as tolerated.

## 2012-05-16 ENCOUNTER — Encounter (HOSPITAL_COMMUNITY): Payer: Self-pay

## 2012-05-16 ENCOUNTER — Ambulatory Visit (HOSPITAL_COMMUNITY)
Admission: RE | Admit: 2012-05-16 | Discharge: 2012-05-16 | Disposition: A | Discharge status: Home / Self Care | Payer: BC Managed Care – PPO | Source: Ambulatory Visit | Attending: Internal Medicine | Admitting: Internal Medicine

## 2012-05-16 VITALS — BP 130/82 | HR 77 | Resp 18 | Ht 72.0 in | Wt 247.8 lb

## 2012-05-16 DIAGNOSIS — G4733 Obstructive sleep apnea (adult) (pediatric): Secondary | ICD-10-CM | POA: Insufficient documentation

## 2012-05-16 DIAGNOSIS — G4731 Primary central sleep apnea: Secondary | ICD-10-CM | POA: Insufficient documentation

## 2012-05-16 DIAGNOSIS — I5042 Chronic combined systolic (congestive) and diastolic (congestive) heart failure: Secondary | ICD-10-CM | POA: Insufficient documentation

## 2012-05-16 DIAGNOSIS — I2510 Atherosclerotic heart disease of native coronary artery without angina pectoris: Secondary | ICD-10-CM

## 2012-05-16 DIAGNOSIS — A310 Pulmonary mycobacterial infection: Secondary | ICD-10-CM | POA: Insufficient documentation

## 2012-05-16 DIAGNOSIS — R0602 Shortness of breath: Secondary | ICD-10-CM | POA: Insufficient documentation

## 2012-05-16 LAB — COMPREHENSIVE METABOLIC PANEL
ALT: 21 U/L (ref 0–53)
AST: 22 U/L (ref 0–37)
Albumin: 4.1 g/dL (ref 3.5–5.2)
Alkaline Phosphatase: 80 U/L (ref 39–117)
BUN: 11 mg/dL (ref 6–23)
CO2: 36 mEq/L — ABNORMAL HIGH (ref 19–32)
Calcium: 10.6 mg/dL — ABNORMAL HIGH (ref 8.4–10.5)
Chloride: 96 mEq/L (ref 96–112)
Creatinine, Ser: 1.14 mg/dL (ref 0.50–1.35)
GFR calc Af Amer: 84 mL/min — ABNORMAL LOW (ref >90)
GFR calc non Af Amer: 72 mL/min — ABNORMAL LOW (ref >90)
Glucose, Bld: 127 mg/dL — ABNORMAL HIGH (ref 70–99)
Potassium: 4.4 mEq/L (ref 3.5–5.1)
Sodium: 140 mEq/L (ref 135–145)
Total Bilirubin: 1 mg/dL (ref 0.3–1.2)
Total Protein: 7.7 g/dL (ref 6.0–8.3)

## 2012-05-16 LAB — LIPID PANEL
Cholesterol: 128 mg/dL (ref 0–200)
HDL: 32 mg/dL — ABNORMAL LOW (ref >39)
LDL Cholesterol: 64 mg/dL (ref 0–99)
Total CHOL/HDL Ratio: 4 RATIO
Triglycerides: 160 mg/dL — ABNORMAL HIGH (ref <150)
VLDL: 32 mg/dL (ref 0–40)

## 2012-05-16 LAB — CBC
HCT: 54.7 % — ABNORMAL HIGH (ref 39.0–52.0)
Hemoglobin: 19.3 g/dL — ABNORMAL HIGH (ref 13.0–17.0)
MCH: 32.2 pg (ref 26.0–34.0)
MCHC: 35.3 g/dL (ref 30.0–36.0)
MCV: 91.3 fL (ref 78.0–100.0)
Platelets: 134 10*3/uL — ABNORMAL LOW (ref 150–400)
RBC: 5.99 MIL/uL — ABNORMAL HIGH (ref 4.22–5.81)
RDW: 15.1 % (ref 11.5–15.5)
WBC: 9.2 10*3/uL (ref 4.0–10.5)

## 2012-05-16 NOTE — Addendum Note (Signed)
Encounter addended by: Noralee Space, RN on: 05/16/2012  2:45 PM<BR>     Documentation filed: Orders

## 2012-05-16 NOTE — Assessment & Plan Note (Signed)
Will refer to Dr. Linus Salmons or Dr. Megan Salon in [place] for further evaluation. Does not seem symptomatic now but may be an issue if he were to need a heart transplant down the road.

## 2012-05-16 NOTE — Patient Instructions (Addendum)
Labs today  Your physician has requested that you have an echocardiogram. Echocardiography is a painless test that uses sound waves to create images of your heart. It provides your doctor with information about the size and shape of your heart and how well your heart's chambers and valves are working. This procedure takes approximately one hour. There are no restrictions for this procedure.  You have been referred to Pulmonary, Dr Shelle Iron on Wednesday 11/20 at 3:00pm, please arrive at 2:45 [phone]  You have been referred to Dr Orvan Falconer on Tuesday 11/12 at 11:15 [phone])  We will contact you in 4 months to schedule your next appointment.

## 2012-05-16 NOTE — Assessment & Plan Note (Addendum)
No evidence of ischemia. Continue current regimen. Will check lipids today. Needs Plavix until at least March 2014 thus will need to avoid surgery until that time unless emergent.

## 2012-05-16 NOTE — Assessment & Plan Note (Signed)
Well compensated. Continue current therapy. Due for repeat echo. If potassium OK can increase lisinopril.

## 2012-05-16 NOTE — Addendum Note (Signed)
Encounter addended by: Noralee Space, RN on: 05/16/2012  3:01 PM<BR>     Documentation filed: Patient Instructions Section

## 2012-05-16 NOTE — Assessment & Plan Note (Signed)
Will refer for sleep study eval.

## 2012-05-16 NOTE — Progress Notes (Signed)
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  Last cath 2011 showed:  LVEF 20% LM: ostial 20% LAD: Diffuse  40% prox, mid 60-70% focal lesion.  D1: 30% tubular lesion. LCX: gave off a ramus branch, small OM-1.  The distal AV groove circ was subtotally occluded which was chronic.  In the ramus branch, there was evidence of a previously placed stent.  This was now totally occluded.  In the OM-1, there was a 40% proximal lesion. RCA:  dominant vessel, had diffuse 40-50% disease throughout.  In the distal RCA, prior to the PDA, there was a tubular 40% lesion.  In the distal RCA just after the takeoff of the PDA, there was a 99% lesion with a near subtotal occlusion.  In the PDA, there was a 5 0% tubular lesion distally. Failed PCI of distal RCA. Anatomy not favorable for CABG.  He is s/p St. Jude  ICD implantation. Now enrolled in Analyze ST.   Most recent Echo 04/06/11; EF 35-40% mild RV hypokinesis. Mild MR.  Saw Dr. Stann Mainland at [facility]. Underwent cath at [facility] in 3/13 and had 2.5x68m Xience DES placed in LAD.   He presents for f/u. Back and leg pain stable. Still hard to walk any distance due to pain/numbness. Using electronic cigarette to quit smoking. Off completely for 3 weeks now. Weight very stable. Occasional CP relieved with NTG. No orthopnea, PND or edema. Taking carvedilol 25 mg bid. Lisinopril decreased to 562mdaily due to hyperkalemia.   ROS: All systems negative except as listed in HPI, PMH and Problem List.  Past Medical History  Diagnosis Date  . Chronic systolic heart failure     EF 20-25%. s/p ST. Jude ICD  . CAD (coronary artery disease)     Last cath 2/12. 3-v CAD. Failed PCI of distal RCAc  CATH [facility] 4/13 with DES  to  LAD X2  . Chronic back pain     lumbar stenosis  . Benign prostatic hypertrophy   . Depression   . History of testicular cancer   . Narcotic dependence     chronic  . Benzodiazepine dependence     chronic  . Anxiety   . CHF (congestive heart failure)   . DJD (degenerative joint disease)   . Automatic implantable cardiac defibrillator St Judes     Analyze ST    Current Outpatient Prescriptions  Medication Sig Dispense Refill  . alfuzosin (UROXATRAL) 10 MG 24 hr tablet Take 1 tablet (10 mg total) by mouth daily.  10 tablet  0  . aspirin 325 MG tablet Take 325 mg by mouth daily.      . carvedilol (COREG) 12.5 MG tablet Take 1.5 tablets (18.75 mg total) by mouth 2 (two) times daily with a meal.  90 tablet  6  . clopidogrel (PLAVIX) 75 MG tablet Take 1 tablet (75 mg total) by mouth daily.  30 tablet  6  . CRESTOR 40 MG tablet TAKE 1 TABLET DAILY.  30 tablet  3  . diazepam (VALIUM) 10 MG tablet Take 10 mg by mouth 3 (three) times daily. For muscle spasms      . ezetimibe (ZETIA) 10 MG tablet Take 10 mg by mouth daily.      .Marland Kitchen  gabapentin (NEURONTIN) 600 MG tablet Take 800 mg by mouth 4 (four) times daily.       Marland Kitchen lactulose (CHRONULAC) 10 GM/15ML solution Take 20 g by mouth daily as needed. For constipation      . lisinopril (PRINIVIL,ZESTRIL) 5 MG tablet Take 5 mg by mouth daily.      Marland Kitchen LORazepam (ATIVAN) 1 MG tablet Take 1 mg by mouth every 6 (six) hours as needed. For anxiety      . morphine (MS CONTIN) 100 MG 12 hr tablet Take 1 tablet (100 mg total) by mouth 3 (three) times daily.  90 tablet  0  . morphine (MSIR) 30 MG tablet Take 1 tablet (30 mg total) by mouth every 6 (six) hours as needed. For pain  120 tablet  0  . nitroGLYCERIN (NITROSTAT) 0.4 MG SL tablet Place 0.4 mg under the tongue every 5 (five) minutes as needed. For chest pain'       . nystatin (MYCOSTATIN) 100000 UNIT/ML suspension Take 500,000 Units by mouth 4 (four) times daily.      . ondansetron (ZOFRAN) 8 MG  tablet Take by mouth every 12 (twelve) hours as needed. For nausea      . spironolactone (ALDACTONE) 25 MG tablet Take 25 mg by mouth daily.        Marland Kitchen testosterone cypionate (DEPOTESTOTERONE CYPIONATE) 200 MG/ML injection Inject into the muscle every 14 (fourteen) days.        Marland Kitchen torsemide (DEMADEX) 20 MG tablet Take 20 mg by mouth daily as needed. For swelling in legs      . zolpidem (AMBIEN) 10 MG tablet Take 10 mg by mouth at bedtime as needed. For sleep      . Zolpidem Tartrate 3.5 MG SUBL Place 3.5 mg under the tongue at bedtime as needed. For sleep        PHYSICAL EXAM: Filed Vitals:   05/16/12 1405  BP: 130/82  Pulse: 77  Resp: 18    General:  Normal. No acute distress.  HEENT: normal Neck: supple. JVP flat. Carotids 2+ bilaterally; no bruits. No lymphadenopathy or thryomegaly appreciated. Cor: PMI normal. Regular rate & rhythm. No rubs, gallops or murmurs. Lungs: Coarse through out Abdomen: obese soft, nontender, nondistended. No hepatosplenomegaly. No bruits or masses. Good bowel sounds. Extremities: no cyanosis, clubbing, rash, edema Neuro: alert & oriented x 3, cranial nerves grossly intact. Moves all 4 extremities w/o difficulty. Affect pleasant.    ASSESSMENT & PLAN:

## 2012-05-17 LAB — TESTOSTERONE: Testosterone: 385.49 ng/dL (ref 300–890)

## 2012-05-24 ENCOUNTER — Ambulatory Visit: Payer: BC Managed Care – PPO | Admitting: Internal Medicine

## 2012-05-24 ENCOUNTER — Ambulatory Visit (HOSPITAL_COMMUNITY)
Admission: RE | Admit: 2012-05-24 | Discharge: 2012-05-24 | Disposition: A | Discharge status: Home / Self Care | Payer: BC Managed Care – PPO | Source: Ambulatory Visit | Attending: Internal Medicine | Admitting: Internal Medicine

## 2012-05-24 DIAGNOSIS — I5042 Chronic combined systolic (congestive) and diastolic (congestive) heart failure: Secondary | ICD-10-CM | POA: Insufficient documentation

## 2012-05-24 DIAGNOSIS — F17200 Nicotine dependence, unspecified, uncomplicated: Secondary | ICD-10-CM | POA: Insufficient documentation

## 2012-05-24 DIAGNOSIS — I2510 Atherosclerotic heart disease of native coronary artery without angina pectoris: Secondary | ICD-10-CM | POA: Insufficient documentation

## 2012-05-24 DIAGNOSIS — I517 Cardiomegaly: Secondary | ICD-10-CM

## 2012-05-24 NOTE — Progress Notes (Signed)
*  PRELIMINARY RESULTS* Echocardiogram 2D Echocardiogram has been performed.  Jeryl Columbia 05/24/2012, 10:55 AM

## 2012-05-26 ENCOUNTER — Encounter
Payer: BC Managed Care – PPO | Attending: Physical Medicine and Rehabilitation | Admitting: Physical Medicine and Rehabilitation

## 2012-05-26 ENCOUNTER — Encounter: Payer: Self-pay | Admitting: Physical Medicine and Rehabilitation

## 2012-05-26 VITALS — BP 142/76 | HR 70 | Resp 14 | Ht 72.0 in | Wt 247.0 lb

## 2012-05-26 DIAGNOSIS — M479 Spondylosis, unspecified: Secondary | ICD-10-CM | POA: Insufficient documentation

## 2012-05-26 DIAGNOSIS — M545 Low back pain, unspecified: Secondary | ICD-10-CM

## 2012-05-26 DIAGNOSIS — M79605 Pain in left leg: Secondary | ICD-10-CM

## 2012-05-26 DIAGNOSIS — M79609 Pain in unspecified limb: Secondary | ICD-10-CM | POA: Insufficient documentation

## 2012-05-26 DIAGNOSIS — I428 Other cardiomyopathies: Secondary | ICD-10-CM | POA: Insufficient documentation

## 2012-05-26 DIAGNOSIS — M48061 Spinal stenosis, lumbar region without neurogenic claudication: Secondary | ICD-10-CM

## 2012-05-26 DIAGNOSIS — I2510 Atherosclerotic heart disease of native coronary artery without angina pectoris: Secondary | ICD-10-CM | POA: Insufficient documentation

## 2012-05-26 MED ORDER — MORPHINE SULFATE ER 100 MG PO TBCR: 100.0000 mg | EXTENDED_RELEASE_TABLET | Freq: Three times a day (TID) | ORAL | Status: DC

## 2012-05-26 MED ORDER — MORPHINE SULFATE 30 MG PO TABS: 30.0000 mg | ORAL_TABLET | Freq: Four times a day (QID) | ORAL | Status: DC | PRN

## 2012-05-26 NOTE — Progress Notes (Signed)
Subjective:    Patient ID: [name], male    DOB: 31-Mar-1961, 51 y.o.   MRN: [id number]  HPI The patient is a 51 year old male, who presents with severe spinal spondylosis and HF . The patient complains about moderate to severe pain in his lower back and LE bilateral . Taking his pain medications , and being in a flexed position alleviate the symptoms. Prolonged standing or walking aggrevates the symptoms. The patient grades his pain as a 7 /10.  Pain Inventory Average Pain 8 Pain Right Now 7 My pain is sharp, burning, stabbing and aching  In the last 24 hours, has pain interfered with the following? General activity 8 Relation with others 8 Enjoyment of life 8 What TIME of day is your pain at its worst? varies Sleep (in general) Fair  Pain is worse with: walking and some activites Pain improves with: rest, pacing activities and medication Relief from Meds: 8  Mobility use a walker how many minutes can you walk? 2-3 ability to climb steps?  yes do you drive?  yes needs help with transfers  Function employed # of hrs/week 20 [employer] I need assistance with the following:  dressing, bathing, meal prep, household duties and shopping  Neuro/Psych bladder control problems weakness numbness trouble walking anxiety  Prior Studies ECG  Physicians involved in your care Any changes since last visit?  no   Family History  Problem Relation Age of Onset  . Heart disease Father 3    Died of MI   History   Social History  . Marital Status: Married    Spouse Name: N/A    Number of Children: N/A  . Years of Education: N/A   Social History Main Topics  . Smoking status: Current Every Day Smoker -- 0.3 packs/day for 25 years  . Smokeless tobacco: Current User     Comment: using e cigarette now  . Alcohol Use: No  . Drug Use: Yes    Special: Marijuana     Comment: Urine showed THC  . Sexually Active: None   Other Topics Concern  . None   Social History Narrative   . None   Past Surgical History  Procedure Date  . Hernia repair   . Asd repair, sinus venosus   . Testicle surgery     testicular cancer surgery  . Cardiac defibrillator placement 08/2010   Past Medical History  Diagnosis Date  . Chronic systolic heart failure     EF 40-98%. s/p ST. Jude ICD  . CAD (coronary artery disease)     Last cath 2/12. 3-v CAD. Failed PCI of distal RCAc  C[facility] 4/13 with DES to  LAD X2  . Chronic back pain     lumbar stenosis  . Benign prostatic hypertrophy   . Depression   . History of testicular cancer   . Narcotic dependence     chronic  . Benzodiazepine dependence     chronic  . Anxiety   . CHF (congestive heart failure)   . DJD (degenerative joint disease)   . Automatic implantable cardiac defibrillator St Judes     Analyze ST   BP 142/76  Pulse 70  Resp 14  Ht 6' (1.829 m)  Wt 247 lb (112.038 kg)  BMI 33.50 kg/m2  SpO2 96%     Review of Systems  Respiratory: Positive for apnea, cough and shortness of breath.   Musculoskeletal: Positive for myalgias, back pain, arthralgias and gait problem.  Neurological: Positive for weakness and numbness.  Hematological: Bruises/bleeds easily.  Psychiatric/Behavioral: The patient is nervous/anxious.   All other systems reviewed and are negative.       Objective:   Physical Exam Constitutional: He is oriented to person, place, and time. He appears well-developed.  Walks with walker  HENT:  Head: Normocephalic.  Eyes: Pupils are equal, round, and reactive to light.  Neck: Normal range of motion.  Neurological: He is alert and oriented to person, place, and time. He has normal reflexes.  Skin: Skin is warm and dry.  Psychiatric: He has a normal mood and affect.  Symmetric normal motor tone is noted throughout. Normal muscle bulk. Muscle testing reveals 5/5 muscle strength of the upper extremity, and 5/5 of the lower extremity, except right iliopsoas 4/5, and left tibialis anterior 4-/5.  Full range of motion in upper and lower extremities. ROM of spine is Restricted in extension. Fine motor movements are normal in both hands.  Sensory is intact and symmetric to light touch, pinprick and proprioception.  DTR in the upper and lower extremity are present and symmetric 2+. No clonus is noted.  Patient arises from chair with slight difficulty. Narrow based gait with a walker, forward flexed spine.  Tenderness paraspinal muscles in L-spine.        Assessment & Plan:  1. Lumbar spinal stenosis with neurogenic claudication. He would benefit from decompressive surgery however the patient has coronary artery disease as well as cardiomyopathy. He therefore is no candidate for surgery at this point. He is reportedly on the heart transplant list at [facility]. He recently underwent angioplasty and stenting of 2 vessels. He plans to talk to his cardiologist at [facility] once again about his surgical risk. The earliest they could consider surgery would be in April of next year, one year after he had his medicated stents placed.  Will continue his current pain medications. Refilled his medication, patient will continue his physical therapy, with modalities for pain relief. Prescribed Flector patches for muscle strain /exacerbation after prolonged walking or sitting at last visit, these were not approved by his insurance at this point, will try to get approval.  Patient seemed to be sedated at last visit, he is back to his normal alert self today, he states, that he is not taking that many benzos anymore, his PCP also noted this and has tried to talk to his psychiatrist in the past, without much success, but the patient has decreased the benzos on his own and informed his physicians. Patient is considering applying for disability in the near future. Follow up in one month with PA.

## 2012-05-26 NOTE — Patient Instructions (Signed)
Stay as active as tolerated. 

## 2012-06-01 ENCOUNTER — Ambulatory Visit (INDEPENDENT_AMBULATORY_CARE_PROVIDER_SITE_OTHER): Payer: BC Managed Care – PPO | Admitting: Pulmonary Disease

## 2012-06-01 ENCOUNTER — Encounter: Payer: Self-pay | Admitting: Pulmonary Disease

## 2012-06-01 VITALS — BP 120/80 | HR 78 | Temp 98.2°F | Ht 72.0 in | Wt 249.0 lb

## 2012-06-01 DIAGNOSIS — G4733 Obstructive sleep apnea (adult) (pediatric): Secondary | ICD-10-CM

## 2012-06-01 NOTE — Patient Instructions (Addendum)
Will get you scheduled for a sleep study, and will arrange followup once results are available.

## 2012-06-01 NOTE — Assessment & Plan Note (Signed)
The patient has a history of obstructive sleep apnea for which he has been on CPAP.  He discontinued the device after 40 pound weight loss, and also because of the inconvenience the mask.  He has since gained weight back, and his history is very suggestive of persistent sleep disordered breathing.  He also has significant underlying cardiac disease that can be negatively impacted by untreated sleep apnea.  For this reason, he needs another sleep study, and the patient is agreeable.

## 2012-06-01 NOTE — Progress Notes (Signed)
  Subjective:    Patient ID: [name], male    DOB: January 11, 1961, 51 y.o.   MRN: [id number]  HPI The patient is a 51 year old male who I've been asked to see for possible obstructive sleep apnea.  The patient was diagnosed 6 years ago with sleep apnea, and treated with CPAP.  He wore this for approximately 3 years, and ultimately discontinued after losing 40 pounds.  He also felt the mask was uncomfortable and inconvenient.  He has since gained weight back, and is having symptoms that are very suggestive of sleep disordered breathing.  He has been noted to have snoring, but his wife has not commented on an abnormal breathing pattern during sleep.  He has frequent awakenings at night, and has not rested in the mornings upon arising.  He can fall asleep anytime he sits down, including trying to watch television or movies.  He does not fall asleep driving during the day, and does not drive at night for safety reasons.  His weight has increased at least 20 pounds over the last 2 years.  His Epworth score today is 5.  Sleep Questionnaire: What time do you typically go to bed?( Between what hours) 3am How long does it take you to fall asleep? 5 mins How many times during the night do you wake up? 3 What time do you get out of bed to start your day? 0900 Do you drive or operate heavy machinery in your occupation? No How much has your weight changed (up or down) over the past two years? (In pounds) 20 lb (9.072 kg) Have you ever had a sleep study before? Yes If yes, location of study? [facility] If yes, date of study? 2006 Do you currently use CPAP? No Do you wear oxygen at any time? No    Review of Systems  Constitutional: Negative for fever and unexpected weight change.  HENT: Negative for ear pain, nosebleeds, congestion, sore throat, rhinorrhea, sneezing, trouble swallowing, dental problem, postnasal drip and sinus pressure.   Eyes: Negative for redness and itching.  Respiratory: Negative for  cough, chest tightness, shortness of breath and wheezing.   Cardiovascular: Negative for palpitations and leg swelling.  Gastrointestinal: Negative for nausea and vomiting.  Genitourinary: Negative for dysuria.  Musculoskeletal: Negative for joint swelling.  Skin: Negative for rash.  Neurological: Negative for headaches.  Hematological: Does not bruise/bleed easily.  Psychiatric/Behavioral: Negative for dysphoric mood. The patient is not nervous/anxious.        Objective:   Physical Exam Constitutional:  Obese male, no acute distress  HENT:  Nares patent without discharge  Oropharynx without exudate, palate and uvula are moderately thickened and elongated.  Eyes:  Perrla, eomi, no scleral icterus  Neck:  No JVD, no TMG  Cardiovascular:  Normal rate, regular rhythm, no rubs or gallops.  2/6 sem        Intact distal pulses  Pulmonary :  Normal breath sounds, no stridor or respiratory distress   No rales, rhonchi, or wheezing  Abdominal:  Soft, nondistended, bowel sounds present.  No tenderness noted.   Musculoskeletal:  mild lower extremity edema noted.  Lymph Nodes:  No cervical lymphadenopathy noted  Skin:  No cyanosis noted  Neurologic:  Appears sleepy, appropriate, moves all 4 extremities without obvious deficit.         Assessment & Plan:

## 2012-06-02 ENCOUNTER — Encounter: Payer: Self-pay | Admitting: Internal Medicine

## 2012-06-02 ENCOUNTER — Ambulatory Visit (INDEPENDENT_AMBULATORY_CARE_PROVIDER_SITE_OTHER): Payer: BC Managed Care – PPO | Admitting: Internal Medicine

## 2012-06-02 VITALS — BP 131/84 | HR 60 | Temp 98.6°F | Ht 72.0 in | Wt 252.0 lb

## 2012-06-02 DIAGNOSIS — Z227 Latent tuberculosis: Secondary | ICD-10-CM

## 2012-06-02 NOTE — Progress Notes (Signed)
Patient ID: [name], male   DOB: 08-Jan-1961, 51 y.o.   MRN: [id number]    [facility]  Reason for Consult: Evaluation and management of prior mycobacterial infections Referring Physician: Dr. Nicholes Mango  Patient Active Problem List  Diagnosis  . CARDIOMYOPATHY, ISCHEMIC  . CAD, NATIVE VESSEL  . COMBINED HEART FAILURE, CHRONIC  . Anxiety disorder  . Chronic back pain  . Depression  . Tobacco abuse  . cHistory of testicular cancer  . Benzodiazepine dependence  . Narcotic dependence  . ICD-St.Jude  . PAD (peripheral artery disease)  . Neurogenic claudication due to lumbar spinal stenosis  . Pre-operative cardiovascular examination  . MAI (mycobacterium avium-intracellulare)  . OSA (obstructive sleep apnea)  . 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   ASPIRIN 325 MG TABLET    Take 325 mg by mouth daily.   CARVEDILOL (COREG) 12.5 MG TABLET    Take 1.5 tablets (18.75 mg total) by mouth 2 (two) times daily with a meal.   CLOPIDOGREL (PLAVIX) 75 MG TABLET    Take 1 tablet (75 mg total) by mouth daily.   CRESTOR 40 MG TABLET    TAKE 1 TABLET DAILY.   DIAZEPAM (VALIUM) 10 MG TABLET    Take 10 mg by mouth 3 (three) times daily. For muscle spasms   EZETIMIBE (ZETIA) 10 MG TABLET    Take 10 mg by mouth daily.   GABAPENTIN (NEURONTIN) 600 MG TABLET    Take 800 mg by mouth 4 (four) times daily.    LACTULOSE (CHRONULAC) 10 GM/15ML SOLUTION    Take 20 g by mouth daily as needed. For constipation   LISINOPRIL (PRINIVIL,ZESTRIL) 5 MG TABLET    Take 5 mg by mouth daily.   LORAZEPAM (ATIVAN) 1 MG TABLET    Take 1 mg by mouth every 6 (six) hours as needed. For anxiety   MORPHINE (MS CONTIN) 100 MG 12 HR TABLET    Take 1 tablet (100 mg total) by mouth 3 (three) times daily.   MORPHINE (MSIR)  30 MG TABLET    Take 1 tablet (30 mg total) by mouth every 6 (six) hours as needed. For pain   NITROGLYCERIN (NITROSTAT) 0.4 MG SL TABLET    Place 0.4 mg under the tongue every 5 (five) minutes as needed. For chest pain'    NYSTATIN (MYCOSTATIN) 100000 UNIT/ML SUSPENSION    Take 500,000 Units by mouth 4 (four) times daily.   ONDANSETRON (ZOFRAN) 8 MG TABLET    Take by mouth every 12 (twelve) hours as needed. For nausea   SPIRONOLACTONE (ALDACTONE) 25 MG TABLET    Take 25 mg by mouth daily.     TESTOSTERONE CYPIONATE (DEPOTESTOTERONE CYPIONATE) 200 MG/ML INJECTION    Inject into the muscle every 14 (fourteen) days.     TORSEMIDE (DEMADEX) 20 MG TABLET    Take 20 mg by mouth daily as needed. For swelling in legs   ZOLPIDEM (AMBIEN) 10 MG TABLET    Take 10 mg by mouth at bedtime as needed. For sleep   ZOLPIDEM TARTRATE 3.5 MG SUBL    Place 3.5 mg under the tongue at bedtime as needed. For sleep  Modified Medications   No medications on file  Discontinued Medications   No medications  on file    Recommendations: 1. Try to obtain medical records from [place] 2. Interferon gamma release assay (quantiferon gold) for latent tuberculosis 3. 2 view chest x-ray  4. Followup in 2-3 weeks  Assessment: It sounds as though [name] has latent tuberculosis and, at the very least had a period of time last year when he was colonized with Mycobacterium avium. I do not have enough information to know if he had symptomatic Mycobacterium avium pneumonia at that time. He does not have any evidence of active pneumonia now. I would recommend that it would be best to have records from [place] for review before making a final recommendation. I most concerned about his latent tuberculosis and will check and interferon gamma release assay. If it is positive as would certainly recommend INH therapy for latent tuberculosis. There are no guidelines for treatment of asymptomatic individuals with prior Mycobacterium avium  infection prior to transplantation. This will need to be discussed with the transplant team at [facility].   HPI: [name] is a 51 y.o. male who is referred to me by Dr. Nicholes Mango for evaluation of the need to treat for prior mycobacterial infections. 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 He has also traveled fairly extensively. In 1997 while living in [place] he had routine PPD skin testing done by one of his physician friends. He does not know of any specific contact with anyone with tuberculosis. However he recalls that his skin test was strongly positive with an area of induration that he estimates was slightly larger than a quarter. He recalls being told that because of his age he did not need any treatment for tuberculosis. He has had pneumonia on several occasions but has never been diagnosed with active tuberculosis. He recently did learn that one of his adult daughters has been treated for tuberculosis though.  Last year, he had a brief episode of hemoptysis and a sputum culture at that time was positive for Mycobacterium avium. He is not sure what his chest x-ray showed at that time. He was still living in [place] and saw an infectious disease doctor there. He cannot remember the doctor's name but recalls being told that he did not need any treatment for Mycobacterium avium. He did not have any further episodes of hemoptysis. A CT scan of his chest done hearing [place] in September of 2012 showed some stable mediastinal adenopathy but no evidence of active pneumonia or prior scar compatible with previous pulmonary tuberculosis.  He has coronary artery disease and systolic heart failure. He has been to [facility] and has been told that he might need heart transplantation within the next 5 years. He is concerned that he might need treatment for mycobacterial infection prior to transplantation.  He  states that he has had mild night sweats throughout all of his adult life. He has been a long-term smoker but has not had a cigarette in the past 6 weeks. He has been using an electronic cigarette to try to wean himself off of nicotine. His chronic smoker's cough has improved and is nonproductive. He has had no change in appetite or weight.  Review of Systems: Pertinent items are noted in HPI.      Past Medical History  Diagnosis Date  . Chronic systolic heart failure     EF 16-10%. s/p ST. Jude ICD  . CAD (coronary artery disease)     Last  cath 2/12. 3-v CAD. Failed PCI of distal RCAc  CATH [facility] 4/13 with DES to  LAD X2  . Chronic back pain     lumbar stenosis  . Benign prostatic hypertrophy   . Depression   . History of testicular cancer   . Narcotic dependence     chronic  . Benzodiazepine dependence     chronic  . Anxiety   . CHF (congestive heart failure)   . DJD (degenerative joint disease)   . Automatic implantable cardiac defibrillator St Judes     Analyze ST    History  Substance Use Topics  . Smoking status: Current Every Day Smoker -- 0.3 packs/day for 25 years  . Smokeless tobacco: Current User     Comment: using e cigarette now  . Alcohol Use: No    Family History  Problem Relation Age of Onset  . Heart disease Father 54    Died of MI   Allergies  Allergen Reactions  . Nalbuphine   . Nubain (Nalbuphine Hcl)   . Propoxyphene And Methadone Other (See Comments)    THROAT CLOSES UP    OBJECTIVE: Blood pressure 131/84, pulse 60, temperature 98.6 F (37 C), temperature source Oral, height 6' (1.829 m), weight 252 lb (114.306 kg). General: He is slightly groggy during exam but in no distress Lymph nodes: No palpable adenopathy Oral: Some of his teeth are worn down. If no oropharyngeal lesions Skin: No rash Lungs: Faint scattered expiratory wheezes Cor: Regular S1 and S2 with no murmurs. He has a left upper chest pacemaker Abdomen: Obese, soft and  nontender   Microbiology: No results found for this or any previous visit (from the past 240 hour(s)).  Cliffton Asters, MD [facility] for Infectious Disease [facility] [phone] pager   [phone] cell 06/02/2012, 5:09 PM

## 2012-06-07 LAB — QUANTIFERON TB GOLD ASSAY (BLOOD): Interferon Gamma Release Assay: NEGATIVE

## 2012-06-10 ENCOUNTER — Telehealth: Payer: Self-pay | Admitting: *Deleted

## 2012-06-10 NOTE — Telephone Encounter (Signed)
Discussed with dr bensimhon, z-pak called to [phone]

## 2012-06-10 NOTE — Telephone Encounter (Signed)
Spoke with pt, he is coughing up yellow sputum and is asking dr bensimhjon for antibiotics. He reports his PCP is closed. Will discuss with dr bensimhon and call the pt back.

## 2012-06-14 ENCOUNTER — Telehealth: Payer: Self-pay | Admitting: *Deleted

## 2012-06-14 ENCOUNTER — Ambulatory Visit
Admission: RE | Admit: 2012-06-14 | Discharge: 2012-06-14 | Disposition: A | Discharge status: Home / Self Care | Payer: BC Managed Care – PPO | Source: Ambulatory Visit | Attending: Internal Medicine | Admitting: Internal Medicine

## 2012-06-14 NOTE — Telephone Encounter (Signed)
Patient called to advise he had his Xray done today, [facility] was closed by the time he got there last week. He also advised that he has been having profuse night sweats, shortness of breath, weakness, dizziness, Right arm pain, and tightness/uncomfortable feeling in his back between his shoulder blades when he breaths. He is not sure of pain as he takes Morphine and does not really feel pain. He thinks he has had some fever but he did not take his temp as he did not have a thermometer until today. He also says that his cough is "different" not able to describe it just said different. Advised patient will let the doctor know he has had the imaging done and what his symptoms are and call him with what he wants to do.

## 2012-06-15 ENCOUNTER — Telehealth: Payer: Self-pay | Admitting: *Deleted

## 2012-06-15 NOTE — Telephone Encounter (Signed)
Patient informed that blood work and chest x-ray were normal.  RN advised pt to continue to monitor temperature and notify [facility] if fever above 101 degrees F.  Pt verbalized understanding.  Has f/u appt w/ Dr. Orvan Falconer 06/23/12.

## 2012-06-20 ENCOUNTER — Encounter: Payer: Self-pay | Admitting: Internal Medicine

## 2012-06-20 ENCOUNTER — Ambulatory Visit (INDEPENDENT_AMBULATORY_CARE_PROVIDER_SITE_OTHER): Payer: BC Managed Care – PPO | Admitting: *Deleted

## 2012-06-20 DIAGNOSIS — I2589 Other forms of chronic ischemic heart disease: Secondary | ICD-10-CM

## 2012-06-20 DIAGNOSIS — Z95810 Presence of automatic (implantable) cardiac defibrillator: Secondary | ICD-10-CM

## 2012-06-20 LAB — ICD DEVICE OBSERVATION
AL AMPLITUDE: 3 mv
AL IMPEDENCE ICD: 400 Ohm
AL THRESHOLD: 0.5 V
ATRIAL PACING ICD: 1 pct
BAMS-0001: 180 {beats}/min
BAMS-0003: 70 {beats}/min
DEV-0020ICD: NEGATIVE
DEVICE MODEL ICD: 815096
HV IMPEDENCE: 63 Ohm
RV LEAD AMPLITUDE: 12 mv
RV LEAD IMPEDENCE ICD: 330 Ohm
RV LEAD THRESHOLD: 2 V
TZAT-0001FASTVT: 1
TZAT-0004FASTVT: 8
TZAT-0012FASTVT: 200 ms
TZAT-0013FASTVT: 3
TZAT-0018FASTVT: NEGATIVE
TZAT-0019FASTVT: 7.5 V
TZAT-0020FASTVT: 1 ms
TZON-0003FASTVT: 340 ms
TZON-0003SLOWVT: 400 ms
TZON-0004FASTVT: 35
TZON-0004SLOWVT: 35
TZON-0005FASTVT: 6
TZON-0005SLOWVT: 6
TZON-0010FASTVT: 40 ms
TZON-0010SLOWVT: 40 ms
TZST-0001FASTVT: 2
TZST-0001FASTVT: 3
TZST-0001FASTVT: 4
TZST-0003FASTVT: 30 J
TZST-0003FASTVT: 40 J
TZST-0003FASTVT: 40 J
VENTRICULAR PACING ICD: 1 pct

## 2012-06-20 NOTE — Progress Notes (Signed)
Pt seen in clinic for follow up of ICD as part of Analyze ST protocol-- checked by industry. .  For full details, see PaceArt report.  No programming changes made today.  Plan to follow up in 3 months with Merlin transmission.  Gypsy Balsam, RN, BSN 06/20/2012 11:46 AM

## 2012-06-22 ENCOUNTER — Ambulatory Visit: Payer: BC Managed Care – PPO | Admitting: Physical Medicine and Rehabilitation

## 2012-06-22 ENCOUNTER — Encounter (HOSPITAL_BASED_OUTPATIENT_CLINIC_OR_DEPARTMENT_OTHER): Payer: BC Managed Care – PPO

## 2012-06-23 ENCOUNTER — Encounter: Payer: Self-pay | Admitting: Physical Medicine and Rehabilitation

## 2012-06-23 ENCOUNTER — Encounter
Payer: BC Managed Care – PPO | Attending: Physical Medicine and Rehabilitation | Admitting: Physical Medicine and Rehabilitation

## 2012-06-23 ENCOUNTER — Ambulatory Visit: Payer: BC Managed Care – PPO | Admitting: Internal Medicine

## 2012-06-23 VITALS — BP 153/89 | HR 68 | Resp 16 | Ht 72.0 in | Wt 258.0 lb

## 2012-06-23 DIAGNOSIS — M545 Low back pain, unspecified: Secondary | ICD-10-CM

## 2012-06-23 DIAGNOSIS — G8929 Other chronic pain: Secondary | ICD-10-CM

## 2012-06-23 DIAGNOSIS — M48061 Spinal stenosis, lumbar region without neurogenic claudication: Secondary | ICD-10-CM

## 2012-06-23 DIAGNOSIS — M48062 Spinal stenosis, lumbar region with neurogenic claudication: Secondary | ICD-10-CM | POA: Insufficient documentation

## 2012-06-23 MED ORDER — MORPHINE SULFATE ER 100 MG PO TBCR: 100.0000 mg | EXTENDED_RELEASE_TABLET | Freq: Three times a day (TID) | ORAL | Status: DC

## 2012-06-23 MED ORDER — MORPHINE SULFATE 30 MG PO TABS: 30.0000 mg | ORAL_TABLET | Freq: Four times a day (QID) | ORAL | Status: DC | PRN

## 2012-06-23 NOTE — Progress Notes (Signed)
Subjective:    Patient ID: [name], male    DOB: 04/24/1961, 51 y.o.   MRN: [id number]  HPI The patient is a 51 year old male, who presents with severe spinal spondylosis and HF . The patient complains about moderate to severe pain in his lower back and LE bilateral . Taking his pain medications , and being in a flexed position alleviate the symptoms. Prolonged standing or walking aggrevates the symptoms. The patient grades his pain as a 7 /10.  Pain Inventory Average Pain 8 Pain Right Now 7 My pain is constant, sharp, burning, stabbing, tingling and aching  In the last 24 hours, has pain interfered with the following? General activity 7 Relation with others 6 Enjoyment of life 5 What TIME of day is your pain at its worst? all the time Sleep (in general) Poor  Pain is worse with: walking, sitting, standing and some activites Pain improves with: medication Relief from Meds: 6  Mobility walk with assistance use a walker how many minutes can you walk? 3-4 ability to climb steps?  yes do you drive?  yes Do you have any goals in this area?  yes  Function not employed: date last employed  I need assistance with the following:  feeding, meal prep, household duties and shopping  Neuro/Psych weakness numbness tingling trouble walking depression anxiety  Prior Studies Any changes since last visit?  no  Physicians involved in your care Any changes since last visit?  no   Family History  Problem Relation Age of Onset  . Heart disease Father 4    Died of MI   History   Social History  . Marital Status: Married    Spouse Name: N/A    Number of Children: N/A  . Years of Education: N/A   Social History Main Topics  . Smoking status: Current Every Day Smoker -- 0.3 packs/day for 25 years  . Smokeless tobacco: Current User     Comment: using e cigarette now  . Alcohol Use: No  . Drug Use: Yes    Special: Marijuana     Comment: Urine showed THC  . Sexually  Active: None   Other Topics Concern  . None   Social History Narrative  . None   Past Surgical History  Procedure Date  . Hernia repair   . Asd repair, sinus venosus   . Testicle surgery     testicular cancer surgery  . Cardiac defibrillator placement 08/2010   Past Medical History  Diagnosis Date  . Chronic systolic heart failure     EF 40-98%. s/p ST. Jude ICD  . CAD (coronary artery disease)     Last cath 2/12. 3-v CAD. Failed PCI of distal RCAc  C[facility] 4/13 with DES to  LAD X2  . Chronic back pain     lumbar stenosis  . Benign prostatic hypertrophy   . Depression   . History of testicular cancer   . Narcotic dependence     chronic  . Benzodiazepine dependence     chronic  . Anxiety   . CHF (congestive heart failure)   . DJD (degenerative joint disease)   . Automatic implantable cardiac defibrillator St Judes     Analyze ST   BP 153/89  Pulse 68  Resp 16  Ht 6' (1.829 m)  Wt 258 lb (117.028 kg)  BMI 34.99 kg/m2  SpO2 97%    Review of Systems  Musculoskeletal: Positive for back pain and gait problem.  Neurological: Positive for weakness and numbness.       Tingling  Psychiatric/Behavioral: Positive for dysphoric mood. The patient is nervous/anxious.   All other systems reviewed and are negative.       Objective:   Physical Exam Constitutional: He is oriented to person, place, and time. He appears well-developed.  Walks with walker  HENT:  Head: Normocephalic.  Eyes: Pupils are equal, round, and reactive to light.  Neck: Normal range of motion.  Neurological: He is alert and oriented to person, place, and time. He has normal reflexes.  Skin: Skin is warm and dry.  Psychiatric: He has a normal mood and affect.  Symmetric normal motor tone is noted throughout. Normal muscle bulk. Muscle testing reveals 5/5 muscle strength of the upper extremity, and 5/5 of the lower extremity, except right iliopsoas 4/5, and left tibialis anterior 4-/5. Full range  of motion in upper and lower extremities. ROM of spine is Restricted in extension. Fine motor movements are normal in both hands.  Sensory is intact and symmetric to light touch, pinprick and proprioception.  DTR in the upper and lower extremity are present and symmetric 2+. No clonus is noted.  Patient arises from chair with slight difficulty. Narrow based gait with a walker, forward flexed spine.  Tenderness paraspinal muscles in L-spine.        Assessment & Plan:  1. Lumbar spinal stenosis with neurogenic claudication. He would benefit from decompressive surgery however the patient has coronary artery disease as well as cardiomyopathy. He therefore is no candidate for surgery at this point. He is reportedly on the heart transplant list at [facility]. He recently underwent angioplasty and stenting of 2 vessels. He plans to talk to his cardiologist at [facility] once again about his surgical risk. The earliest they could consider surgery would be in April of next year, one year after he had his medicated stents placed.  Will continue his current pain medications. Refilled his medication, patient will continue his physical therapy, with modalities for pain relief. Prescribed Flector patches for muscle strain /exacerbation after prolonged walking or sitting at last visit, these were not approved by his insurance at this point, will try to get approval.  Patient seemed to be sedated at last visit, he is back to his normal alert self today, he states, that he is not taking that many benzos anymore, his PCP also noted this and has tried to talk to his psychiatrist in the past, without much success, but the patient has decreased the benzos on his own and informed his physicians.  Patient is considering applying for disability in the near future.  Follow up in one month with PA.

## 2012-06-23 NOTE — Patient Instructions (Signed)
Stay as active as tolerated. 

## 2012-07-14 ENCOUNTER — Ambulatory Visit (HOSPITAL_BASED_OUTPATIENT_CLINIC_OR_DEPARTMENT_OTHER): Payer: BC Managed Care – PPO

## 2012-07-19 ENCOUNTER — Ambulatory Visit: Payer: BC Managed Care – PPO | Admitting: Internal Medicine

## 2012-07-22 ENCOUNTER — Encounter: Payer: Self-pay | Admitting: Physical Medicine and Rehabilitation

## 2012-07-22 ENCOUNTER — Encounter
Payer: BC Managed Care – PPO | Attending: Physical Medicine and Rehabilitation | Admitting: Physical Medicine and Rehabilitation

## 2012-07-22 VITALS — BP 150/91 | HR 76 | Resp 14 | Ht 72.0 in | Wt 248.0 lb

## 2012-07-22 DIAGNOSIS — M48061 Spinal stenosis, lumbar region without neurogenic claudication: Secondary | ICD-10-CM

## 2012-07-22 DIAGNOSIS — G8929 Other chronic pain: Secondary | ICD-10-CM

## 2012-07-22 DIAGNOSIS — M47817 Spondylosis without myelopathy or radiculopathy, lumbosacral region: Secondary | ICD-10-CM | POA: Insufficient documentation

## 2012-07-22 DIAGNOSIS — M545 Low back pain, unspecified: Secondary | ICD-10-CM | POA: Insufficient documentation

## 2012-07-22 DIAGNOSIS — M549 Dorsalgia, unspecified: Secondary | ICD-10-CM

## 2012-07-22 MED ORDER — MORPHINE SULFATE ER 100 MG PO TBCR: 100.0000 mg | EXTENDED_RELEASE_TABLET | Freq: Three times a day (TID) | ORAL | Status: DC

## 2012-07-22 MED ORDER — MORPHINE SULFATE 30 MG PO TABS: 30.0000 mg | ORAL_TABLET | Freq: Four times a day (QID) | ORAL | Status: DC | PRN

## 2012-07-22 NOTE — Patient Instructions (Signed)
Continue with staying as active as tolerated 

## 2012-07-22 NOTE — Progress Notes (Signed)
Subjective:    Patient ID: [name], male    DOB: [birth year], 53 y.o.   MRN: [id number]  HPI The patient is a 52 year old male, who presents with severe spinal spondylosis and HF . The patient complains about moderate to severe pain in his lower back and LE bilateral . Taking his pain medications , and being in a flexed position alleviate the symptoms. Prolonged standing or walking aggrevates the symptoms. The patient grades his pain as a 7 /10.  Pain Inventory Average Pain 8 Pain Right Now 8 My pain is constant, sharp, burning, stabbing and tingling  In the last 24 hours, has pain interfered with the following? General activity 6 Relation with others 8 Enjoyment of life 7 What TIME of day is your pain at its worst? day and evening Sleep (in general) Poor  Pain is worse with: walking, standing and some activites Pain improves with: rest, therapy/exercise and medication Relief from Meds: 5  Mobility walk with assistance use a cane use a walker ability to climb steps?  yes do you drive?  yes needs help with transfers  Function employed # of hrs/week   Neuro/Psych numbness trouble walking  Prior Studies Any changes since last visit?  no  Physicians involved in your care Any changes since last visit?  no   Family History  Problem Relation Age of Onset  . Heart disease Father 53    Died of MI   History   Social History  . Marital Status: Married    Spouse Name: N/A    Number of Children: N/A  . Years of Education: N/A   Social History Main Topics  . Smoking status: Current Every Day Smoker -- 0.3 packs/day for 25 years  . Smokeless tobacco: Current User     Comment: using e cigarette now  . Alcohol Use: No  . Drug Use: Yes    Special: Marijuana     Comment: Urine showed THC  . Sexually Active: None   Other Topics Concern  . None   Social History Narrative  . None   Past Surgical History  Procedure Date  . Hernia repair   . Asd repair, sinus  venosus   . Testicle surgery     testicular cancer surgery  . Cardiac defibrillator placement 08/2010   Past Medical History  Diagnosis Date  . Chronic systolic heart failure     EF 40-98%. s/p ST. Jude ICD  . CAD (coronary artery disease)     Last cath 2/12. 3-v CAD. Failed PCI of distal RCAc  C[facility] 4/13 with DES to  LAD X2  . Chronic back pain     lumbar stenosis  . Benign prostatic hypertrophy   . Depression   . History of testicular cancer   . Narcotic dependence     chronic  . Benzodiazepine dependence     chronic  . Anxiety   . CHF (congestive heart failure)   . DJD (degenerative joint disease)   . Automatic implantable cardiac defibrillator St Judes     Analyze ST   BP 150/91  Pulse 76  Resp 14  Ht 6' (1.829 m)  Wt 248 lb (112.492 kg)  BMI 33.63 kg/m2  SpO2 96%     Review of Systems  Musculoskeletal: Positive for back pain and gait problem.  Neurological: Positive for numbness.  All other systems reviewed and are negative.       Objective:   Physical Exam Constitutional: He is  oriented to person, place, and time. He appears well-developed.  Walks with walker  HENT:  Head: Normocephalic.  Eyes: Pupils are equal, round, and reactive to light.  Neck: Normal range of motion.  Neurological: He is alert and oriented to person, place, and time. He has normal reflexes.  Skin: Skin is warm and dry.  Psychiatric: He has a normal mood and affect.  Symmetric normal motor tone is noted throughout. Normal muscle bulk. Muscle testing reveals 5/5 muscle strength of the upper extremity, and 5/5 of the lower extremity, except right iliopsoas 4/5, and left tibialis anterior 4-/5. Full range of motion in upper and lower extremities. ROM of spine is Restricted in extension. Fine motor movements are normal in both hands.  Sensory is intact and symmetric to light touch, pinprick and proprioception.  DTR in the upper and lower extremity are present and symmetric 2+. No  clonus is noted.  Patient arises from chair with slight difficulty. Narrow based gait with a walker, forward flexed spine.  Tenderness paraspinal muscles in L-spine.        Assessment & Plan:  1. Lumbar spinal stenosis with neurogenic claudication. He would benefit from decompressive surgery however the patient has coronary artery disease as well as cardiomyopathy. He therefore is no candidate for surgery at this point. He is reportedly on the heart transplant list at [facility]. He recently underwent angioplasty and stenting of 2 vessels. He plans to talk to his cardiologist at [facility] once again about his surgical risk. The earliest they could consider surgery would be in April of next year, one year after he had his medicated stents placed.  Will continue his current pain medications. Refilled his medication, patient will continue his physical therapy, with modalities for pain relief. Prescribed Flector patches for muscle strain /exacerbation after prolonged walking or sitting at last visit, these were not approved by his insurance at this point, will try to get approval.  Patient seemed to be sedated at last visit, he is back to his normal alert self today, he states, that he is not taking that many benzos anymore, his PCP also noted this and has tried to talk to his psychiatrist in the past, without much success, but the patient has decreased the benzos on his own and informed his physicians.  Patient is considering applying for disability in the near future.  Follow up in one month with PA.

## 2012-08-02 ENCOUNTER — Ambulatory Visit: Payer: BC Managed Care – PPO | Admitting: Internal Medicine

## 2012-08-07 ENCOUNTER — Ambulatory Visit (HOSPITAL_BASED_OUTPATIENT_CLINIC_OR_DEPARTMENT_OTHER): Payer: BC Managed Care – PPO

## 2012-08-09 ENCOUNTER — Ambulatory Visit: Payer: BC Managed Care – PPO | Admitting: Internal Medicine

## 2012-08-19 ENCOUNTER — Encounter: Payer: Self-pay | Admitting: Physical Medicine and Rehabilitation

## 2012-08-19 ENCOUNTER — Encounter
Payer: BC Managed Care – PPO | Attending: Physical Medicine and Rehabilitation | Admitting: Physical Medicine and Rehabilitation

## 2012-08-19 VITALS — BP 116/81 | HR 82 | Resp 16 | Ht 72.0 in | Wt 250.0 lb

## 2012-08-19 DIAGNOSIS — F17200 Nicotine dependence, unspecified, uncomplicated: Secondary | ICD-10-CM | POA: Insufficient documentation

## 2012-08-19 DIAGNOSIS — Z9861 Coronary angioplasty status: Secondary | ICD-10-CM | POA: Insufficient documentation

## 2012-08-19 DIAGNOSIS — M48061 Spinal stenosis, lumbar region without neurogenic claudication: Secondary | ICD-10-CM

## 2012-08-19 DIAGNOSIS — I428 Other cardiomyopathies: Secondary | ICD-10-CM | POA: Insufficient documentation

## 2012-08-19 DIAGNOSIS — M79609 Pain in unspecified limb: Secondary | ICD-10-CM | POA: Insufficient documentation

## 2012-08-19 DIAGNOSIS — M47817 Spondylosis without myelopathy or radiculopathy, lumbosacral region: Secondary | ICD-10-CM | POA: Insufficient documentation

## 2012-08-19 DIAGNOSIS — Z95810 Presence of automatic (implantable) cardiac defibrillator: Secondary | ICD-10-CM | POA: Insufficient documentation

## 2012-08-19 DIAGNOSIS — I2510 Atherosclerotic heart disease of native coronary artery without angina pectoris: Secondary | ICD-10-CM | POA: Insufficient documentation

## 2012-08-19 DIAGNOSIS — M545 Low back pain, unspecified: Secondary | ICD-10-CM | POA: Insufficient documentation

## 2012-08-19 DIAGNOSIS — G8929 Other chronic pain: Secondary | ICD-10-CM

## 2012-08-19 MED ORDER — MORPHINE SULFATE ER 100 MG PO TBCR: 100.0000 mg | EXTENDED_RELEASE_TABLET | Freq: Three times a day (TID) | ORAL | Status: DC

## 2012-08-19 MED ORDER — MORPHINE SULFATE 30 MG PO TABS: 30.0000 mg | ORAL_TABLET | Freq: Four times a day (QID) | ORAL | Status: DC | PRN

## 2012-08-19 NOTE — Progress Notes (Signed)
Subjective:    Patient ID: [name], male    DOB: 02-04-1961, 52 y.o.   MRN: [id number]  HPI The patient is a 52 year old male, who presents with severe spinal spondylosis and HF . The patient complains about moderate to severe pain in his lower back and LE bilateral . Taking his pain medications , and being in a flexed position alleviate the symptoms. Prolonged standing or walking aggrevates the symptoms. The patient grades his pain as a 7 /10.  Pain Inventory Average Pain 8 Pain Right Now 8 My pain is sharp, burning, dull and stabbing  In the last 24 hours, has pain interfered with the following? General activity 7 Relation with others 7 Enjoyment of life 5 What TIME of day is your pain at its worst? daytime Sleep (in general) Poor  Pain is worse with: walking and standing Pain improves with: rest, therapy/exercise and medication Relief from Meds: 5  Mobility use a cane use a walker do you drive?  yes  Function disabled: date disabled in process I need assistance with the following:  dressing, bathing, meal prep, household duties and shopping  Neuro/Psych numbness trouble walking anxiety  Prior Studies Any changes since last visit?  no  Physicians involved in your care Any changes since last visit?  no   Family History  Problem Relation Age of Onset  . Heart disease Father 72    Died of MI   History   Social History  . Marital Status: Married    Spouse Name: N/A    Number of Children: N/A  . Years of Education: N/A   Social History Main Topics  . Smoking status: Current Every Day Smoker -- 0.3 packs/day for 25 years  . Smokeless tobacco: Current User     Comment: using e cigarette now  . Alcohol Use: No  . Drug Use: Yes    Special: Marijuana     Comment: Urine showed THC  . Sexually Active: None   Other Topics Concern  . None   Social History Narrative  . None   Past Surgical History  Procedure Date  . Hernia repair   . Asd repair,  sinus venosus   . Testicle surgery     testicular cancer surgery  . Cardiac defibrillator placement 08/2010   Past Medical History  Diagnosis Date  . Chronic systolic heart failure     EF 40-98%. s/p ST. Jude ICD  . CAD (coronary artery disease)     Last cath 2/12. 3-v CAD. Failed PCI of distal RCAc  CATH [facility] 4/13 with DES to  LAD X2  . Chronic back pain     lumbar stenosis  . Benign prostatic hypertrophy   . Depression   . History of testicular cancer   . Narcotic dependence     chronic  . Benzodiazepine dependence     chronic  . Anxiety   . CHF (congestive heart failure)   . DJD (degenerative joint disease)   . Automatic implantable cardiac defibrillator [employer] Jud[employer]     Analyze ST   BP 116/81  Pulse 82  Resp 16  Ht 6' (1.829 m)  Wt 250 lb (113.399 kg)  BMI 33.91 kg/m2  SpO2 93%    Review of Systems  Musculoskeletal: Positive for gait problem.  Psychiatric/Behavioral: The patient is nervous/anxious.   All other systems reviewed and are negative.       Objective:   Physical Exam Constitutional: He is oriented to person, place,  and time. He appears well-developed.  Walks with walker  HENT:  Head: Normocephalic.  Eyes: Pupils are equal, round, and reactive to light.  Neck: Normal range of motion.  Neurological: He is alert and oriented to person, place, and time. He has normal reflexes.  Skin: Skin is warm and dry.  Psychiatric: He has a normal mood and affect.  Symmetric normal motor tone is noted throughout. Normal muscle bulk. Muscle testing reveals 5/5 muscle strength of the upper extremity, and 5/5 of the lower extremity, except right iliopsoas 4/5, and left tibialis anterior 4-/5. Full range of motion in upper and lower extremities. ROM of spine is Restricted in extension. Fine motor movements are normal in both hands.  Sensory is intact and symmetric to light touch, pinprick and proprioception.  DTR in the upper and lower extremity are present and symmetric  2+. No clonus is noted.  Patient arises from chair with slight difficulty. Narrow based gait with a walker, forward flexed spine.  Tenderness paraspinal muscles in L-spine.        Assessment & Plan:  1. Lumbar spinal stenosis with neurogenic claudication. He would benefit from decompressive surgery however the patient has coronary artery disease as well as cardiomyopathy. He therefore is no candidate for surgery at this point. He is reportedly on the heart transplant list at [facility]. He recently underwent angioplasty and stenting of 2 vessels. He plans to talk to his cardiologist at [facility] once again about his surgical risk. The earliest they could consider surgery would be in April of next year, one year after he had his medicated stents placed.  Will continue his current pain medications. Refilled his medication, patient will continue his physical therapy, with modalities for pain relief. Prescribed Flector patches for muscle strain /exacerbation after prolonged walking or sitting at last visit, these were not approved by his insurance at this point, will try to get approval.  Patient seemed to be sedated at last visit, he is back to his normal alert self today, he states, that he is not taking that many benzos anymore, his PCP also noted this and has tried to talk to his psychiatrist in the past, without much success, but the patient has decreased the benzos on his own and informed his physicians.  Patient is considering applying for disability in the near future.  Patienthas appointment with Dr. Noel Gerold for an evaluation of his spine on 09/15/2011, he wants me to talk to Dr. Noel Gerold about his case before he sees him, I will do this after he has signed a release form. Follow up in one month with PA.

## 2012-08-19 NOTE — Patient Instructions (Signed)
Stay as active as tolerated. 

## 2012-08-22 NOTE — Progress Notes (Signed)
[name], please correct note to reflect that he may be cleared for surgery April of this year

## 2012-08-23 ENCOUNTER — Telehealth: Payer: Self-pay | Admitting: Internal Medicine

## 2012-09-08 ENCOUNTER — Other Ambulatory Visit: Payer: Self-pay

## 2012-09-08 ENCOUNTER — Ambulatory Visit (INDEPENDENT_AMBULATORY_CARE_PROVIDER_SITE_OTHER): Payer: BC Managed Care – PPO | Admitting: *Deleted

## 2012-09-08 ENCOUNTER — Encounter (INDEPENDENT_AMBULATORY_CARE_PROVIDER_SITE_OTHER): Payer: BC Managed Care – PPO

## 2012-09-08 ENCOUNTER — Encounter: Payer: Self-pay | Admitting: Internal Medicine

## 2012-09-08 DIAGNOSIS — I2589 Other forms of chronic ischemic heart disease: Secondary | ICD-10-CM

## 2012-09-08 DIAGNOSIS — I2510 Atherosclerotic heart disease of native coronary artery without angina pectoris: Secondary | ICD-10-CM

## 2012-09-08 DIAGNOSIS — R0989 Other specified symptoms and signs involving the circulatory and respiratory systems: Secondary | ICD-10-CM

## 2012-09-08 LAB — ICD DEVICE OBSERVATION
AL AMPLITUDE: 3 mv
AL IMPEDENCE ICD: 450 Ohm
AL THRESHOLD: 0.5 V
ATRIAL PACING ICD: 0.42 pct
BAMS-0001: 180 {beats}/min
BAMS-0003: 70 {beats}/min
CHARGE TIME: 9.4 s
DEV-0020ICD: NEGATIVE
DEVICE MODEL ICD: 815096
FVT: 0
HV IMPEDENCE: 75 Ohm
MODE SWITCH EPISODES: 0
PACEART VT: 0
RV LEAD AMPLITUDE: 12 mv
RV LEAD IMPEDENCE ICD: 350 Ohm
RV LEAD THRESHOLD: 1.5 V
TOT-0006: 20120201000000
TOT-0007: 2
TOT-0008: 0
TOT-0009: 1
TOT-0010: 8
TZAT-0001FASTVT: 1
TZAT-0004FASTVT: 8
TZAT-0012FASTVT: 200 ms
TZAT-0013FASTVT: 3
TZAT-0018FASTVT: NEGATIVE
TZAT-0019FASTVT: 7.5 V
TZAT-0020FASTVT: 1 ms
TZON-0003FASTVT: 340 ms
TZON-0003SLOWVT: 400 ms
TZON-0004FASTVT: 35
TZON-0004SLOWVT: 35
TZON-0005FASTVT: 6
TZON-0005SLOWVT: 6
TZON-0010FASTVT: 40 ms
TZON-0010SLOWVT: 40 ms
TZST-0001FASTVT: 2
TZST-0001FASTVT: 3
TZST-0001FASTVT: 4
TZST-0003FASTVT: 30 J
TZST-0003FASTVT: 40 J
TZST-0003FASTVT: 40 J
VENTRICULAR PACING ICD: 0.07 pct
VF: 0

## 2012-09-08 NOTE — Progress Notes (Signed)
icd check in clinic  

## 2012-09-16 ENCOUNTER — Encounter
Payer: BC Managed Care – PPO | Attending: Physical Medicine and Rehabilitation | Admitting: Physical Medicine and Rehabilitation

## 2012-09-19 ENCOUNTER — Telehealth: Payer: Self-pay | Admitting: Physical Medicine & Rehabilitation
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
## 2012-09-20 ENCOUNTER — Encounter
Payer: BC Managed Care – PPO | Attending: Physical Medicine and Rehabilitation | Admitting: Physical Medicine and Rehabilitation

## 2012-09-20 ENCOUNTER — Encounter: Payer: Self-pay | Admitting: Physical Medicine and Rehabilitation

## 2012-09-20 VITALS — BP 131/82 | HR 80 | Resp 14 | Ht 72.0 in | Wt 243.8 lb

## 2012-09-20 DIAGNOSIS — G8929 Other chronic pain: Secondary | ICD-10-CM

## 2012-09-20 DIAGNOSIS — M545 Low back pain, unspecified: Secondary | ICD-10-CM

## 2012-09-20 DIAGNOSIS — M48062 Spinal stenosis, lumbar region with neurogenic claudication: Secondary | ICD-10-CM | POA: Insufficient documentation

## 2012-09-20 DIAGNOSIS — I2510 Atherosclerotic heart disease of native coronary artery without angina pectoris: Secondary | ICD-10-CM | POA: Insufficient documentation

## 2012-09-20 DIAGNOSIS — M47817 Spondylosis without myelopathy or radiculopathy, lumbosacral region: Secondary | ICD-10-CM | POA: Insufficient documentation

## 2012-09-20 MED ORDER — MORPHINE SULFATE 30 MG PO TABS: 30.0000 mg | ORAL_TABLET | Freq: Four times a day (QID) | ORAL | Status: DC | PRN

## 2012-09-20 MED ORDER — MORPHINE SULFATE ER 100 MG PO TBCR: 100.0000 mg | EXTENDED_RELEASE_TABLET | Freq: Three times a day (TID) | ORAL | Status: DC

## 2012-09-20 NOTE — Patient Instructions (Signed)
Stay as active as tolerated. 
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
## 2012-10-13 ENCOUNTER — Ambulatory Visit (HOSPITAL_BASED_OUTPATIENT_CLINIC_OR_DEPARTMENT_OTHER): Payer: BC Managed Care – PPO | Attending: Pulmonary Disease

## 2012-10-13 VITALS — Ht 73.0 in | Wt 245.0 lb

## 2012-10-13 DIAGNOSIS — G4733 Obstructive sleep apnea (adult) (pediatric): Secondary | ICD-10-CM

## 2012-10-13 DIAGNOSIS — I4949 Other premature depolarization: Secondary | ICD-10-CM | POA: Insufficient documentation

## 2012-10-13 DIAGNOSIS — G4730 Sleep apnea, unspecified: Secondary | ICD-10-CM | POA: Insufficient documentation

## 2012-10-17 ENCOUNTER — Ambulatory Visit (HOSPITAL_COMMUNITY): Payer: BC Managed Care – PPO

## 2012-10-20 ENCOUNTER — Encounter
Payer: BC Managed Care – PPO | Attending: Physical Medicine and Rehabilitation | Admitting: Physical Medicine and Rehabilitation

## 2012-10-20 ENCOUNTER — Encounter: Payer: Self-pay | Admitting: Physical Medicine and Rehabilitation

## 2012-10-20 VITALS — BP 139/79 | HR 83 | Resp 16 | Ht 72.0 in | Wt 258.0 lb

## 2012-10-20 DIAGNOSIS — M545 Low back pain, unspecified: Secondary | ICD-10-CM

## 2012-10-20 DIAGNOSIS — M48062 Spinal stenosis, lumbar region with neurogenic claudication: Secondary | ICD-10-CM | POA: Insufficient documentation

## 2012-10-20 DIAGNOSIS — Z5181 Encounter for therapeutic drug level monitoring: Secondary | ICD-10-CM

## 2012-10-20 DIAGNOSIS — Z79899 Other long term (current) drug therapy: Secondary | ICD-10-CM | POA: Insufficient documentation

## 2012-10-20 DIAGNOSIS — G8929 Other chronic pain: Secondary | ICD-10-CM | POA: Insufficient documentation

## 2012-10-20 MED ORDER — MORPHINE SULFATE 30 MG PO TABS: 30.0000 mg | ORAL_TABLET | Freq: Four times a day (QID) | ORAL | Status: DC | PRN

## 2012-10-20 MED ORDER — MORPHINE SULFATE ER 100 MG PO TBCR: 100.0000 mg | EXTENDED_RELEASE_TABLET | Freq: Three times a day (TID) | ORAL | Status: DC

## 2012-10-20 NOTE — Patient Instructions (Signed)
Continue with staying as active as tolerated 

## 2012-10-20 NOTE — Progress Notes (Signed)
Subjective:    Patient ID: [name], male    DOB: [birth year], 52 y.o.   MRN: [id number]  HPI The patient is a 52 year old male, who presents with severe spinal spondylosis and HF . The patient complains about moderate to severe pain in his lower back and LE bilateral . Taking his pain medications , and being in a flexed position alleviate the symptoms. Prolonged standing or walking aggrevates the symptoms. The patient grades his pain as a 6 /10. He has not seen Dr. Noel Gerold yet , he rescheduled for next week.   Pain Inventory Average Pain 8 Pain Right Now 9 My pain is sharp and burning  In the last 24 hours, has pain interfered with the following? General activity 7 Relation with others 7 Enjoyment of life 8 What TIME of day is your pain at its worst? morning and day Sleep (in general) Fair  Pain is worse with: walking and standing Pain improves with: medication Relief from Meds: 3  Mobility use a walker  Function employed # of hrs/week 10 I need assistance with the following:  dressing, household duties and shopping  Neuro/Psych numbness trouble walking anxiety  Prior Studies Any changes since last visit?  no  Physicians involved in your care Any changes since last visit?  no   Family History  Problem Relation Age of Onset  . Heart disease Father 25    Died of MI   History   Social History  . Marital Status: Married    Spouse Name: N/A    Number of Children: N/A  . Years of Education: N/A   Social History Main Topics  . Smoking status: Current Every Day Smoker -- 0.30 packs/day for 25 years  . Smokeless tobacco: Current User     Comment: using e cigarette now  . Alcohol Use: No  . Drug Use: Yes    Special: Marijuana     Comment: Urine showed THC  . Sexually Active: None   Other Topics Concern  . None   Social History Narrative  . None   Past Surgical History  Procedure Laterality Date  . Hernia repair    . Asd repair, sinus venosus    .  Testicle surgery      testicular cancer surgery  . Cardiac defibrillator placement  08/2010   Past Medical History  Diagnosis Date  . Chronic systolic heart failure     EF 40-98%. s/p ST. Jude ICD  . CAD (coronary artery disease)     Last cath 2/12. 3-v CAD. Failed PCI of distal RCAc  CATH [facility] 4/13 with DES to  LAD X2  . Chronic back pain     lumbar stenosis  . Benign prostatic hypertrophy   . Depression   . History of testicular cancer   . Narcotic dependence     chronic  . Benzodiazepine dependence     chronic  . Anxiety   . CHF (congestive heart failure)   . DJD (degenerative joint disease)   . Automatic implantable cardiac defibrillator [employer] Judes     Analyze ST   BP 139/79  Pulse 83  Resp 16  Ht 6' (1.829 m)  Wt 258 lb (117.028 kg)  BMI 34.98 kg/m2  SpO2 95%     Review of Systems  Constitutional: Positive for diaphoresis.  Respiratory: Positive for cough and shortness of breath.   Musculoskeletal: Positive for back pain and gait problem.  Neurological: Positive for numbness.  Psychiatric/Behavioral: The patient  is nervous/anxious.   All other systems reviewed and are negative.       Objective:   Physical Exam Constitutional: He is oriented to person, place, and time. He appears well-developed.  Walks with walker  HENT:  Head: Normocephalic.  Eyes: Pupils are equal, round, and reactive to light.  Neck: Normal range of motion.  Neurological: He is alert and oriented to person, place, and time. He has normal reflexes.  Skin: Skin is warm and dry.  Psychiatric: He has a normal mood and affect.  Symmetric normal motor tone is noted throughout. Normal muscle bulk. Muscle testing reveals 5/5 muscle strength of the upper extremity, and 5/5 of the lower extremity, except right iliopsoas 4/5, and left tibialis anterior 4-/5. Full range of motion in upper and lower extremities. ROM of spine is Restricted in extension. Fine motor movements are normal in both hands.   Sensory is intact and symmetric to light touch, pinprick and proprioception.  DTR in the upper and lower extremity are present and symmetric 2+. No clonus is noted.  Patient arises from chair with slight difficulty. Narrow based gait with a walker, forward flexed spine.  Tenderness paraspinal muscles in L-spine.        Assessment & Plan:  1. Lumbar spinal stenosis with neurogenic claudication. He would benefit from decompressive surgery however the patient has coronary artery disease as well as cardiomyopathy. He therefore is no candidate for surgery at this point. He is reportedly on the heart transplant list at [facility]. He recently underwent angioplasty and stenting of 2 vessels. He plans to talk to his cardiologist at [facility] once again about his surgical risk. The earliest they could consider surgery would be in April of next year, one year after he had his medicated stents placed.  Will continue his current pain medications. Refilled his medication.    Patient is considering applying for disability in the near future.  Patient has appointment with Dr. Noel Gerold for an evaluation of his spine on next week, he wants me to talk to Dr. Noel Gerold about his case before he sees him, what I did after he signed a release form.  Follow up in one month with PA.

## 2012-10-27 DIAGNOSIS — G4730 Sleep apnea, unspecified: Secondary | ICD-10-CM

## 2012-10-27 DIAGNOSIS — G4710 Hypersomnia, unspecified: Secondary | ICD-10-CM

## 2012-10-28 NOTE — Procedures (Signed)
[name], [name]                ACCOUNT NO.:  [id number]  MEDICAL RECORD NO.:  [id number]          PATIENT TYPE:  OUT  LOCATION:  SLEEP CENTER                 FACILITY:  [facility]  PHYSICIAN:  Barbaraann Share, MD,FCCPDATE OF BIRTH:  11-23-1960  DATE OF STUDY:  10/13/2012                           NOCTURNAL POLYSOMNOGRAM  REFERRING PHYSICIAN:  Barbaraann Share, MD,FCCP  LOCATION:  Sleep Lab.  REFERRING PHYSICIAN:  Barbaraann Share, MD,FCCP  INDICATION FOR STUDY:  Hypersomnia with sleep apnea.  EPWORTH SLEEPINESS SCORE:  16.  SLEEP ARCHITECTURE:  The patient had total sleep time of 255 minutes with no slow-wave sleep or REM noted.  Sleep onset latency was very rapid at 1 minute, and sleep efficiency was poor at 68%.  RESPIRATORY DATA:  The patient was found to have 59 obstructive apneas, 207 central apneas, and 1 obstructive hypopnea, giving him an apnea- hypopnea index of 63 events per hour.  The patient slept entirely in the supine position during the night, and there was loud snoring noted throughout.  OXYGEN DATA:  There was O2 desaturation as low as 76% with the patient's obstructive events.  CARDIAC DATA:  Frequent PVCs noted, but no clinically significant arrhythmias were seen.  MOVEMENT/PARASOMNIA:  The patient had no significant leg jerks or other abnormal behaviors noted.  IMPRESSION/RECOMMENDATION: 1. Severe complex sleep apnea with both central and obstructive     events, and an apnea/hypopnea index of 63 events per hour.  There     was oxygen desaturation as low as 76%.  Treatment for this degree     of sleep apnea should focus primarily on a positive pressure     device, as well as aggressive weight loss.  Given the complex     nature of the patient's sleep apnea, would recommend return to the     sleep center for formal CPAP titration. 2. Frequent premature ventricular contractions noted throughout.     Barbaraann Share, MD,FCCP Diplomate, American Board  of Sleep Medicine    [facility]  D:  10/27/2012 14:15:41  T:  10/28/2012 00:35:08  Job:  [id number]

## 2012-11-02 ENCOUNTER — Other Ambulatory Visit (HOSPITAL_COMMUNITY): Payer: Self-pay | Admitting: *Deleted

## 2012-11-02 ENCOUNTER — Telehealth: Payer: Self-pay

## 2012-11-02 MED ORDER — CLOPIDOGREL BISULFATE 75 MG PO TABS: 75.0000 mg | ORAL_TABLET | Freq: Every day | ORAL | Status: DC

## 2012-11-02 NOTE — Telephone Encounter (Signed)
Message copied by Judd Gaudier on Wed Nov 02, 2012  8:46 AM ------      Message from: Su Monks      Created: Thu Oct 27, 2012 11:19 AM       UDS showed codein, patient told me that he had an exacerbation of his COPD, most likely he got cough medication, please ask him, I will educate patient that he has to inform us about this meds. ------

## 2012-11-02 NOTE — Telephone Encounter (Signed)
Tried to contact patient but not available and not able to leave message.

## 2012-11-07 ENCOUNTER — Other Ambulatory Visit: Payer: Self-pay | Admitting: Orthopaedic Surgery

## 2012-11-07 DIAGNOSIS — M4310 Spondylolisthesis, site unspecified: Secondary | ICD-10-CM

## 2012-11-09 ENCOUNTER — Ambulatory Visit: Payer: BC Managed Care – PPO | Admitting: Pulmonary Disease

## 2012-11-11 ENCOUNTER — Ambulatory Visit: Payer: BC Managed Care – PPO | Admitting: Pulmonary Disease

## 2012-11-17 ENCOUNTER — Telehealth: Payer: Self-pay | Admitting: Internal Medicine

## 2012-11-17 ENCOUNTER — Encounter: Payer: Self-pay | Admitting: Physical Medicine and Rehabilitation

## 2012-11-17 ENCOUNTER — Encounter
Payer: BC Managed Care – PPO | Attending: Physical Medicine and Rehabilitation | Admitting: Physical Medicine and Rehabilitation

## 2012-11-17 VITALS — BP 160/93 | HR 100 | Resp 14 | Ht 72.0 in | Wt 247.0 lb

## 2012-11-17 DIAGNOSIS — I2510 Atherosclerotic heart disease of native coronary artery without angina pectoris: Secondary | ICD-10-CM | POA: Insufficient documentation

## 2012-11-17 DIAGNOSIS — M545 Low back pain, unspecified: Secondary | ICD-10-CM

## 2012-11-17 DIAGNOSIS — M48062 Spinal stenosis, lumbar region with neurogenic claudication: Secondary | ICD-10-CM

## 2012-11-17 DIAGNOSIS — G8929 Other chronic pain: Secondary | ICD-10-CM

## 2012-11-17 DIAGNOSIS — Z79899 Other long term (current) drug therapy: Secondary | ICD-10-CM | POA: Insufficient documentation

## 2012-11-17 DIAGNOSIS — I428 Other cardiomyopathies: Secondary | ICD-10-CM | POA: Insufficient documentation

## 2012-11-17 DIAGNOSIS — M79609 Pain in unspecified limb: Secondary | ICD-10-CM | POA: Insufficient documentation

## 2012-11-17 DIAGNOSIS — I509 Heart failure, unspecified: Secondary | ICD-10-CM | POA: Insufficient documentation

## 2012-11-17 MED ORDER — MORPHINE SULFATE ER 100 MG PO TBCR: 100.0000 mg | EXTENDED_RELEASE_TABLET | Freq: Three times a day (TID) | ORAL | Status: DC

## 2012-11-17 MED ORDER — MORPHINE SULFATE 30 MG PO TABS: 30.0000 mg | ORAL_TABLET | Freq: Four times a day (QID) | ORAL | Status: DC | PRN

## 2012-11-17 NOTE — Patient Instructions (Signed)
Try to stay as active as tolerated 

## 2012-11-17 NOTE — Telephone Encounter (Signed)
Spoke with [facility], the pt is having a lumbar myogram and will need to hold his plavix not carvedilol. She will refax the form.

## 2012-11-17 NOTE — Telephone Encounter (Signed)
New Problem:    Called in wanting to know the status of a fax sent on 11/08/12 regarding the patient holding his carvedilol (COREG) 12.5 MG tablet for 5 days.  Please call back.

## 2012-11-17 NOTE — Progress Notes (Signed)
Subjective:    Patient ID: [name], male    DOB: April 01, 1961, 52 y.o.   MRN: [id number]  HPI The patient is a 52 year old male, who presents with severe spinal spondylosis and HF . The patient complains about moderate to severe pain in his lower back and LE bilateral . Taking his pain medications , and being in a flexed position alleviate the symptoms. Prolonged standing or walking aggrevates the symptoms. The patient grades his pain as a 6 /10. He has seen Dr. Noel Gerold, who suggested to place an X-stop, into his L-spine. Dr. Noel Gerold ordered a CT-myelogram, which the patient will schedule.    Pain Inventory Average Pain 8 Pain Right Now 8 My pain is constant, sharp and burning  In the last 24 hours, has pain interfered with the following? General activity 8 Relation with others 6 Enjoyment of life 8 What TIME of day is your pain at its worst? day and evening Sleep (in general) Poor  Pain is worse with: walking, standing and some activites Pain improves with: rest and medication Relief from Meds: 7  Mobility use a walker ability to climb steps?  yes Do you have any goals in this area?  yes  Function employed # of hrs/week 30+  Neuro/Psych weakness numbness trouble walking anxiety  Prior Studies Any changes since last visit?  no  Physicians involved in your care Any changes since last visit?  no   Family History  Problem Relation Age of Onset  . Heart disease Father 65    Died of MI   History   Social History  . Marital Status: Married    Spouse Name: N/A    Number of Children: N/A  . Years of Education: N/A   Social History Main Topics  . Smoking status: Current Every Day Smoker -- 0.30 packs/day for 25 years  . Smokeless tobacco: Current User     Comment: using e cigarette now  . Alcohol Use: No  . Drug Use: Yes    Special: Marijuana     Comment: Urine showed THC  . Sexually Active: None   Other Topics Concern  . None   Social History Narrative   . None   Past Surgical History  Procedure Laterality Date  . Hernia repair    . Asd repair, sinus venosus    . Testicle surgery      testicular cancer surgery  . Cardiac defibrillator placement  08/2010   Past Medical History  Diagnosis Date  . Chronic systolic heart failure     EF 40-98%. s/p ST. Jude ICD  . CAD (coronary artery disease)     Last cath 2/12. 3-v CAD. Failed PCI of distal RCAc  [facility] 4/13 with DES to  LAD X2  . Chronic back pain     lumbar stenosis  . Benign prostatic hypertrophy   . Depression   . History of testicular cancer   . Narcotic dependence     chronic  . Benzodiazepine dependence     chronic  . Anxiety   . CHF (congestive heart failure)   . DJD (degenerative joint disease)   . Automatic implantable cardiac defibrillator St Judes     Analyze ST   BP 160/93  Pulse 100  Resp 14  Ht 6' (1.829 m)  Wt 247 lb (112.038 kg)  BMI 33.49 kg/m2  SpO2 90%     Review of Systems  Respiratory: Positive for apnea and shortness of breath.  Gastrointestinal: Positive for constipation.  Genitourinary: Positive for difficulty urinating.  Musculoskeletal: Positive for back pain.  Neurological: Positive for weakness and numbness.  Psychiatric/Behavioral: The patient is nervous/anxious.   All other systems reviewed and are negative.       Objective:   Physical Exam Constitutional: He is oriented to person, place, and time. He appears well-developed.  Walks with walker  HENT:  Head: Normocephalic.  Eyes: Pupils are equal, round, and reactive to light.  Neck: Normal range of motion.  Neurological: He is alert and oriented to person, place, and time. He has normal reflexes.  Skin: Skin is warm and dry.  Psychiatric: He has a normal mood and affect.  Symmetric normal motor tone is noted throughout. Normal muscle bulk. Muscle testing reveals 5/5 muscle strength of the upper extremity, and 5/5 of the lower extremity, except right iliopsoas 4/5, and  left tibialis anterior 4-/5. Full range of motion in upper and lower extremities. ROM of spine is Restricted in extension. Fine motor movements are normal in both hands.  Sensory is intact and symmetric to light touch, pinprick and proprioception.  DTR in the upper and lower extremity are present and symmetric 2+. No clonus is noted.  Patient arises from chair with slight difficulty. Narrow based gait with a walker, forward flexed spine.  Tenderness paraspinal muscles in L-spine.        Assessment & Plan:  1. Lumbar spinal stenosis with neurogenic claudication. He would benefit from decompressive surgery however the patient has coronary artery disease as well as cardiomyopathy. He therefore is no candidate for surgery at this point. He is reportedly on the heart transplant list at [facility] He recently underwent angioplasty and stenting of 2 vessels. He plans to talk to his cardiologist at [facility] once again about his surgical risk. The earliest they could consider surgery would be in April of next year, one year after he had his medicated stents placed.  Will continue his current pain medications. Refilled his medication.  Patient is considering applying for disability in the near future.  Patient has appointment with Dr. Noel Gerold for an evaluation of his spine.  Dr. Noel Gerold suggested to place an X-stop, into his L-spine. Dr. Noel Gerold ordered a CT-myelogram, which the patient will schedule. Follow up in one month with PA.

## 2012-11-23 ENCOUNTER — Ambulatory Visit: Payer: BC Managed Care – PPO | Admitting: Pulmonary Disease

## 2012-11-24 ENCOUNTER — Institutional Professional Consult (permissible substitution): Payer: BC Managed Care – PPO | Admitting: Critical Care Medicine

## 2012-11-24 ENCOUNTER — Ambulatory Visit (HOSPITAL_COMMUNITY)
Admission: RE | Admit: 2012-11-24 | Discharge: 2012-11-24 | Disposition: A | Discharge status: Home / Self Care | Payer: BC Managed Care – PPO | Source: Ambulatory Visit | Attending: Internal Medicine | Admitting: Internal Medicine

## 2012-11-24 ENCOUNTER — Encounter (HOSPITAL_COMMUNITY): Payer: Self-pay

## 2012-11-24 VITALS — BP 120/70 | HR 71 | Ht 72.0 in | Wt 245.0 lb

## 2012-11-24 DIAGNOSIS — I509 Heart failure, unspecified: Secondary | ICD-10-CM | POA: Insufficient documentation

## 2012-11-24 DIAGNOSIS — N529 Male erectile dysfunction, unspecified: Secondary | ICD-10-CM

## 2012-11-24 DIAGNOSIS — I5042 Chronic combined systolic (congestive) and diastolic (congestive) heart failure: Secondary | ICD-10-CM

## 2012-11-24 DIAGNOSIS — M549 Dorsalgia, unspecified: Secondary | ICD-10-CM | POA: Insufficient documentation

## 2012-11-24 DIAGNOSIS — Z01810 Encounter for preprocedural cardiovascular examination: Secondary | ICD-10-CM

## 2012-11-24 DIAGNOSIS — I2510 Atherosclerotic heart disease of native coronary artery without angina pectoris: Secondary | ICD-10-CM

## 2012-11-24 DIAGNOSIS — M79609 Pain in unspecified limb: Secondary | ICD-10-CM | POA: Insufficient documentation

## 2012-11-24 MED ORDER — LISINOPRIL 10 MG PO TABS: 10.0000 mg | ORAL_TABLET | Freq: Every day | ORAL | Status: DC

## 2012-11-24 NOTE — Assessment & Plan Note (Signed)
No evidence of ischemia. Continue current regimen.

## 2012-11-24 NOTE — Progress Notes (Signed)
Patient ID: [name], male   DOB: 08-05-1960, 52 y.o.   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  Last cath 2011 showed:  LVEF 20% LM: ostial 20% LAD: Diffuse  40% prox, mid 60-70% focal lesion.  D1: 30% tubular lesion. LCX: gave off a ramus branch, small OM-1.  The distal AV groove circ was subtotally occluded which was chronic.  In the ramus branch, there was evidence of a previously placed stent.  This was now totally occluded.  In the OM-1, there was a 40% proximal lesion. RCA:  dominant vessel, had diffuse 40-50% disease throughout.  In the distal RCA, prior to the PDA, there was a tubular 40% lesion.  In the distal RCA just after the takeoff of the PDA, there was a 99% lesion with a near subtotal occlusion.  In the PDA, there was a 5 0% tubular lesion distally. Failed PCI of distal RCA. Anatomy not favorable for CABG.  He is s/p St. Jude  ICD implantation. Was enrolled in Analyze ST.   Most recent Echo 9/13; EF 30-35% mild RV dilation.  Saw Dr. Stann Mainland at [facility]. Underwent cath [facility] in 3/13 and had 2.5x31m Xience DES placed in LAD. [facility]   He presents for follow up. Complains of back pain. Requesting clearance for back surgery which can be done under local with conscious sedation. Complains of mild dyspnea with exertion. No edema. Has lost 13 pounds since April.  Occasional CP - no change. Does not weight at home. Not exercising due to back pain.  Scheduled to see Dr. RStann Mainlandsoon on May 21. SArlyce Harmanstopped in past due to hyperkalemia. Asking for Viagra.    ROS: All systems negative except as listed in HPI, PMH and Problem List.  Past Medical  History  Diagnosis Date  . Chronic systolic heart failure     EF 20-25%. s/p ST. Jude ICD  . CAD (coronary artery disease)     Last cath 2/12. 3-v CAD. Failed PCI of distal RCAc  C[facility] 4/13 with DES to  LAD X2  . Chronic back pain     lumbar stenosis  . Benign prostatic hypertrophy   . Depression   . History of testicular cancer   . Narcotic dependence     chronic  . Benzodiazepine dependence     chronic  . Anxiety   . CHF (congestive heart failure)   . DJD (degenerative joint disease)   . Automatic implantable cardiac defibrillator St Judes     Analyze ST    Current Outpatient Prescriptions  Medication Sig Dispense Refill  . alfuzosin (UROXATRAL) 10 MG 24 hr tablet Take 1 tablet (10 mg total) by mouth daily.  10 tablet  0  . aspirin 325 MG tablet Take 325 mg by mouth daily.      . carvedilol (COREG) 12.5 MG tablet Take 1.5 tablets (18.75 mg total) by mouth 2 (two) times daily with a meal.  90 tablet  6  . clopidogrel (PLAVIX) 75 MG tablet Take 1 tablet (75 mg total) by mouth daily.  30 tablet  6  . CRESTOR 40 MG tablet TAKE 1 TABLET DAILY.  3[place]  tablet  3  . diazepam (VALIUM) 10 MG tablet Take 10 mg by mouth 3 (three) times daily. For muscle spasms      . ezetimibe (ZETIA) 10 MG tablet Take 10 mg by mouth daily.      . fluticasone (FLONASE) 50 MCG/ACT nasal spray       . gabapentin (NEURONTIN) 600 MG tablet Take 800 mg by mouth 4 (four) times daily.       Marland Kitchen lactulose (CHRONULAC) 10 GM/15ML solution Take 20 g by mouth daily as needed. For constipation      . lisinopril (PRINIVIL,ZESTRIL) 5 MG tablet Take 5 mg by mouth daily.      Marland Kitchen LORazepam (ATIVAN) 1 MG tablet Take 1 mg by mouth every 6 (six) hours as needed. For anxiety      . morphine (MS CONTIN) 100 MG 12 hr tablet Take 1 tablet (100 mg total) by mouth 3 (three) times daily.  90 tablet  0  . morphine (MSIR) 30 MG tablet Take 1 tablet (30 mg total) by mouth every 6 (six) hours as needed. For pain  120 tablet  0  .  nitroGLYCERIN (NITROSTAT) 0.4 MG SL tablet Place 0.4 mg under the tongue every 5 (five) minutes as needed. For chest pain'       . nystatin (MYCOSTATIN) 100000 UNIT/ML suspension Take 500,000 Units by mouth 4 (four) times daily.      Marland Kitchen spironolactone (ALDACTONE) 25 MG tablet Take 25 mg by mouth daily.        Marland Kitchen testosterone cypionate (DEPOTESTOTERONE CYPIONATE) 200 MG/ML injection Inject into the muscle every 14 (fourteen) days.        Marland Kitchen torsemide (DEMADEX) 20 MG tablet Take 20 mg by mouth daily as needed. For swelling in legs      . VIAGRA 100 MG tablet       . zolpidem (AMBIEN) 10 MG tablet Take 10 mg by mouth at bedtime as needed. For sleep      . Zolpidem Tartrate 3.5 MG SUBL Place 3.5 mg under the tongue at bedtime as needed. For sleep       No current facility-administered medications for this encounter.    PHYSICAL EXAM: Filed Vitals:   11/24/12 1430  BP: 120/70  Pulse: 71    General:  Normal. No acute distress.  HEENT: normal Neck: supple. JVP flat. Carotids 2+ bilaterally; no bruits. No lymphadenopathy or thryomegaly appreciated. Cor: PMI normal. Regular rate & rhythm. No rubs, gallops 2/6 TR murmur Lungs: Clear Abdomen: obese soft, nontender, nondistended. No hepatosplenomegaly. No bruits or masses. Good bowel sounds. Extremities: no cyanosis, clubbing, rash, edema Neuro: alert & oriented x 3, cranial nerves grossly intact. Moves all 4 extremities w/o difficulty. Affect pleasant.    ASSESSMENT & PLAN:

## 2012-11-24 NOTE — Patient Instructions (Addendum)
Increase Lisinopril to 10 mg daily

## 2012-11-24 NOTE — Assessment & Plan Note (Addendum)
Stable. Volume status looks ok. Increase lisinopril to 10 daily. Check BMET in 1 week.

## 2012-11-24 NOTE — Assessment & Plan Note (Signed)
Ok to use Viagra. Warned never to use it with NTG.

## 2012-11-24 NOTE — Assessment & Plan Note (Signed)
I think he is low-moderate risk for peri-op cardiac events with his minimally-invasive back surgery. Can proceed. Will hold Plavix for 5 days.

## 2012-11-24 NOTE — Addendum Note (Signed)
Encounter addended by: Noralee Space, RN on: 11/24/2012  3:20 PM<BR>     Documentation filed: Patient Instructions Section, Orders

## 2012-11-28 ENCOUNTER — Ambulatory Visit (INDEPENDENT_AMBULATORY_CARE_PROVIDER_SITE_OTHER): Payer: BC Managed Care – PPO | Admitting: *Deleted

## 2012-11-28 ENCOUNTER — Ambulatory Visit
Admission: RE | Admit: 2012-11-28 | Discharge: 2012-11-28 | Disposition: A | Discharge status: Home / Self Care | Payer: BC Managed Care – PPO | Source: Ambulatory Visit | Attending: Orthopaedic Surgery | Admitting: Orthopaedic Surgery

## 2012-11-28 ENCOUNTER — Telehealth: Payer: Self-pay | Admitting: *Deleted

## 2012-11-28 DIAGNOSIS — M4310 Spondylolisthesis, site unspecified: Secondary | ICD-10-CM

## 2012-11-28 DIAGNOSIS — I5042 Chronic combined systolic (congestive) and diastolic (congestive) heart failure: Secondary | ICD-10-CM

## 2012-11-28 DIAGNOSIS — Z95810 Presence of automatic (implantable) cardiac defibrillator: Secondary | ICD-10-CM

## 2012-11-28 LAB — REMOTE ICD DEVICE
AL AMPLITUDE: 3 mv
AL IMPEDENCE ICD: 410 Ohm
ATRIAL PACING ICD: 1 pct
BAMS-0001: 180 {beats}/min
BAMS-0003: 70 {beats}/min
DEV-0020ICD: NEGATIVE
DEVICE MODEL ICD: 815096
HV IMPEDENCE: 78 Ohm
RV LEAD AMPLITUDE: 12 mv
RV LEAD IMPEDENCE ICD: 330 Ohm
TZAT-0001FASTVT: 1
TZAT-0004FASTVT: 8
TZAT-0012FASTVT: 200 ms
TZAT-0013FASTVT: 3
TZAT-0018FASTVT: NEGATIVE
TZAT-0019FASTVT: 7.5 V
TZAT-0020FASTVT: 1 ms
TZON-0003FASTVT: 340 ms
TZON-0003SLOWVT: 400 ms
TZON-0004FASTVT: 35
TZON-0004SLOWVT: 35
TZON-0005FASTVT: 6
TZON-0005SLOWVT: 6
TZON-0010FASTVT: 40 ms
TZON-0010SLOWVT: 40 ms
TZST-0001FASTVT: 2
TZST-0001FASTVT: 3
TZST-0001FASTVT: 4
TZST-0003FASTVT: 30 J
TZST-0003FASTVT: 40 J
TZST-0003FASTVT: 40 J
VENTRICULAR PACING ICD: 1 pct

## 2012-11-28 MED ORDER — IOHEXOL 180 MG/ML  SOLN
18.0000 mL | Freq: Once | INTRAMUSCULAR | Status: AC | PRN
  Administered 2012-11-28: 18 mL via INTRATHECAL

## 2012-11-28 NOTE — Telephone Encounter (Signed)
Patient needs to schedule f/u appt to review sleep study results.  Pt has cancelled multiple appts and no showed---very important if they make a f/u that they keep the appt.  ATC x 1--no voicemail on cell# LMOM x 1 on home #

## 2012-11-28 NOTE — Progress Notes (Signed)
Pt states he has been off plavix for the past 5 days. 

## 2012-12-01 ENCOUNTER — Other Ambulatory Visit (HOSPITAL_COMMUNITY): Payer: Self-pay | Admitting: *Deleted

## 2012-12-01 ENCOUNTER — Institutional Professional Consult (permissible substitution): Payer: BC Managed Care – PPO | Admitting: Critical Care Medicine

## 2012-12-01 MED ORDER — EZETIMIBE 10 MG PO TABS: 10.0000 mg | ORAL_TABLET | Freq: Every day | ORAL | Status: DC

## 2012-12-01 NOTE — Telephone Encounter (Signed)
Pt has scheduled a sleep f/u for 12-21-12. I advised pt to keep this appt 'very important". Pt said he would. Nothing furtehr needed. Mariann Laster

## 2012-12-08 ENCOUNTER — Other Ambulatory Visit (HOSPITAL_COMMUNITY): Payer: Self-pay | Admitting: *Deleted

## 2012-12-08 MED ORDER — SILDENAFIL CITRATE 100 MG PO TABS: 100.0000 mg | ORAL_TABLET | ORAL | Status: DC | PRN

## 2012-12-13 ENCOUNTER — Telehealth (HOSPITAL_COMMUNITY): Payer: Self-pay | Admitting: *Deleted

## 2012-12-13 NOTE — Telephone Encounter (Signed)
Pt called and stated he wanted to proceed with the minimally invasive surgery with Dr Sharolyn Douglas, Dr Gala Romney stated it was ok in last OV note 5/15, note faxed to Dr Noel Gerold at [phone]

## 2012-12-15 ENCOUNTER — Telehealth: Payer: Self-pay

## 2012-12-15 ENCOUNTER — Encounter: Payer: Self-pay | Admitting: Physical Medicine and Rehabilitation

## 2012-12-15 ENCOUNTER — Encounter
Payer: BC Managed Care – PPO | Attending: Physical Medicine and Rehabilitation | Admitting: Physical Medicine and Rehabilitation

## 2012-12-15 VITALS — BP 134/84 | HR 78 | Resp 16 | Ht 72.0 in | Wt 247.0 lb

## 2012-12-15 DIAGNOSIS — M4310 Spondylolisthesis, site unspecified: Secondary | ICD-10-CM

## 2012-12-15 DIAGNOSIS — Q762 Congenital spondylolisthesis: Secondary | ICD-10-CM

## 2012-12-15 DIAGNOSIS — M48062 Spinal stenosis, lumbar region with neurogenic claudication: Secondary | ICD-10-CM

## 2012-12-15 DIAGNOSIS — M47817 Spondylosis without myelopathy or radiculopathy, lumbosacral region: Secondary | ICD-10-CM | POA: Insufficient documentation

## 2012-12-15 DIAGNOSIS — F17200 Nicotine dependence, unspecified, uncomplicated: Secondary | ICD-10-CM | POA: Insufficient documentation

## 2012-12-15 MED ORDER — MORPHINE SULFATE ER 100 MG PO TBCR: 100.0000 mg | EXTENDED_RELEASE_TABLET | Freq: Three times a day (TID) | ORAL | Status: DC

## 2012-12-15 MED ORDER — MORPHINE SULFATE 30 MG PO TABS: 30.0000 mg | ORAL_TABLET | Freq: Four times a day (QID) | ORAL | Status: DC | PRN

## 2012-12-15 NOTE — Telephone Encounter (Signed)
Patient is going to come in early and his pharmacy can send him is prescription, but he will need his next script to post date so he is able to do so.  Can discuss as next appointment as well.

## 2012-12-15 NOTE — Patient Instructions (Signed)
Stay as active as tolerated. 

## 2012-12-15 NOTE — Progress Notes (Deleted)
Pain Inventory Average Pain 8 Pain Right Now 8 My pain is burning and stabbing  In the last 24 hours, has pain interfered with the following? General activity 7 Relation with others 7 Enjoyment of life 5 What TIME of day is your pain at its worst? morning,evening,night Sleep (in general) NA  Pain is worse with: walking Pain improves with: medication Relief from Meds: 3  Mobility use a walker how many minutes can you walk? 10-15 ability to climb steps?  yes do you drive?  yes Do you have any goals in this area?  yes  Function retired I need assistance with the following:  feeding, dressing, meal prep, household duties and shopping  Neuro/Psych bladder control problems weakness numbness trouble walking anxiety  Prior Studies Any changes since last visit?  no  Physicians involved in your care Any changes since last visit?  no   Family History  Problem Relation Age of Onset  . Heart disease Father 77    Died of MI   History   Social History  . Marital Status: Married    Spouse Name: N/A    Number of Children: N/A  . Years of Education: N/A   Social History Main Topics  . Smoking status: Current Every Day Smoker -- 0.30 packs/day for 25 years  . Smokeless tobacco: Current User     Comment: using e cigarette now  . Alcohol Use: No  . Drug Use: Yes    Special: Marijuana     Comment: Urine showed THC  . Sexually Active: None   Other Topics Concern  . None   Social History Narrative  . None   Past Surgical History  Procedure Laterality Date  . Hernia repair    . Asd repair, sinus venosus    . Testicle surgery      testicular cancer surgery  . Cardiac defibrillator placement  08/2010   Past Medical History  Diagnosis Date  . Chronic systolic heart failure     EF 16-10%. s/p ST. Jude ICD  . CAD (coronary artery disease)     Last cath 2/12. 3-v CAD. Failed PCI of distal RCAc  [facility] 4/13 with DES to  LAD X2  . Chronic back pain     lumbar  stenosis  . Benign prostatic hypertrophy   . Depression   . History of testicular cancer   . Narcotic dependence     chronic  . Benzodiazepine dependence     chronic  . Anxiety   . CHF (congestive heart failure)   . DJD (degenerative joint disease)   . Automatic implantable cardiac defibrillator [employer] Judes     Analyze ST   BP 134/84  Pulse 78  Resp 16  Ht 6' (1.829 m)  Wt 247 lb (112.038 kg)  BMI 33.49 kg/m2  SpO2 93%

## 2012-12-15 NOTE — Telephone Encounter (Signed)
Patient is going to cap cod the end of June to July for a few weeks.  He was wondering if he can get a 45 day supply of his medication to keep him from having to come back early, or print a script for next month.  Please advise.

## 2012-12-15 NOTE — Progress Notes (Deleted)
  Subjective:    Patient ID: [name], male    DOB: 03-17-1961, 52 y.o.   MRN: [id number]  HPI    Review of Systems     Objective:   Physical Exam        Assessment & Plan:

## 2012-12-15 NOTE — Progress Notes (Signed)
Subjective:    Patient ID: [name], male    DOB: 1960/09/29, 52 y.o.   MRN: [id number]  HPI The patient is a 52 year old male, who presents with severe spinal spondylosis and HF . The patient complains about moderate to severe pain in his lower back and LE bilateral . Taking his pain medications , and being in a flexed position alleviate the symptoms. Prolonged standing or walking aggrevates the symptoms. The patient grades his pain as a 6 /10. He has seen Dr. Noel Gerold, who suggested to place an X-stop, into his L-spine. Dr. Noel Gerold ordered a CT-myelogram, which the patient will schedule.   Pain Inventory Average Pain 8 Pain Right Now 8 My pain is burning and stabbing  In the last 24 hours, has pain interfered with the following? General activity 7 Relation with others 7 Enjoyment of life 5 What TIME of day is your pain at its worst? morning,evening,night Sleep (in general) NA  Pain is worse with: walking Pain improves with: medication Relief from Meds: 3  Mobility use a walker how many minutes can you walk? 10-15 ability to climb steps?  yes do you drive?  yes Do you have any goals in this area?  yes  Function retired I need assistance with the following:  feeding, dressing, meal prep, household duties and shopping  Neuro/Psych bladder control problems weakness numbness trouble walking anxiety  Prior Studies Any changes since last visit?  no  Physicians involved in your care Any changes since last visit?  no   Family History  Problem Relation Age of Onset  . Heart disease Father 27    Died of MI   History   Social History  . Marital Status: Married    Spouse Name: N/A    Number of Children: N/A  . Years of Education: N/A   Social History Main Topics  . Smoking status: Current Every Day Smoker -- 0.30 packs/day for 25 years  . Smokeless tobacco: Current User     Comment: using e cigarette now  . Alcohol Use: No  . Drug Use: Yes    Special: Marijuana      Comment: Urine showed THC  . Sexually Active: None   Other Topics Concern  . None   Social History Narrative  . None   Past Surgical History  Procedure Laterality Date  . Hernia repair    . Asd repair, sinus venosus    . Testicle surgery      testicular cancer surgery  . Cardiac defibrillator placement  08/2010   Past Medical History  Diagnosis Date  . Chronic systolic heart failure     EF 08-65%. s/p ST. Jude ICD  . CAD (coronary artery disease)     Last cath 2/12. 3-v CAD. Failed PCI of distal RCAc  [facility] 4/13 with DES to  LAD X2  . Chronic back pain     lumbar stenosis  . Benign prostatic hypertrophy   . Depression   . History of testicular cancer   . Narcotic dependence     chronic  . Benzodiazepine dependence     chronic  . Anxiety   . CHF (congestive heart failure)   . DJD (degenerative joint disease)   . Automatic implantable cardiac defibrillator [employer] Judes     Analyze ST   BP 134/84  Pulse 78  Resp 16  Ht 6' (1.829 m)  Wt 247 lb (112.038 kg)  BMI 33.49 kg/m2  SpO2 93%  Review of Systems  Respiratory: Positive for cough and shortness of breath.   Cardiovascular: Positive for leg swelling.  Gastrointestinal: Positive for abdominal pain and constipation.  Musculoskeletal: Positive for gait problem.  Neurological: Positive for weakness and numbness.  Psychiatric/Behavioral: Positive for agitation.  All other systems reviewed and are negative.       Objective:   Physical Exam Constitutional: He is oriented to person, place, and time. He appears well-developed.  Walks with walker  HENT:  Head: Normocephalic.  Eyes: Pupils are equal, round, and reactive to light.  Neck: Normal range of motion.  Neurological: He is alert and oriented to person, place, and time. He has normal reflexes.  Skin: Skin is warm and dry.  Psychiatric: He has a normal mood and affect.  Symmetric normal motor tone is noted throughout. Normal muscle bulk. Muscle  testing reveals 5/5 muscle strength of the upper extremity, and 5/5 of the lower extremity, except right iliopsoas 4/5, and left tibialis anterior 4-/5. Full range of motion in upper and lower extremities. ROM of spine is Restricted in extension. Fine motor movements are normal in both hands.  Sensory is intact and symmetric to light touch, pinprick and proprioception.  DTR in the upper and lower extremity are present and symmetric 2+. No clonus is noted.  Patient arises from chair with slight difficulty. Narrow based gait with a walker, forward flexed spine.  Tenderness paraspinal muscles in L-spine.        Assessment & Plan:  1. Lumbar spinal stenosis with neurogenic claudication. He would benefit from decompressive surgery however the patient has coronary artery disease as well as cardiomyopathy. He therefore is no candidate for surgery at this point. He is reportedly on the heart transplant list at [facility]. He recently underwent angioplasty and stenting of 2 vessels. He plans to talk to his cardiologist at [facility] once again about his surgical risk. The earliest they could consider surgery would be in April of next year, one year after he had his medicated stents placed.  Will continue his current pain medications. Refilled his medication.  Patient is considering applying for disability in the near future.  Patient has appointment with Dr. Noel Gerold for an evaluation of his spine. Dr. Noel Gerold suggested to place an X-stop, into his L-spine. Dr. Noel Gerold ordered a CT-myelogram, which showed :   L2-3: Mild multifactorial stenosis without apparent neural  compression.  L3-4: Severe multifactorial stenosis. 2 mm of anterolisthesis  with flexion.  L4-5: Very severe multifactorial stenosis. 6 mm of  anterolisthesis that increases to 9 mm with flexion and reduces to  3 mm with extension.  L5-S1: Poor contrast opacity because of the stenosis at L4-5.  Disc degeneration and facet degeneration. 2 mm of  anterolisthesis  with flexion that reduces with extension.  Follow up in one month with PA.

## 2012-12-15 NOTE — Telephone Encounter (Signed)
It is ok to write him an extra prescription to be filled when due, for this one time

## 2012-12-19 ENCOUNTER — Ambulatory Visit: Payer: BC Managed Care – PPO | Admitting: Physical Medicine and Rehabilitation

## 2012-12-20 ENCOUNTER — Encounter: Payer: Self-pay | Admitting: *Deleted

## 2012-12-21 ENCOUNTER — Ambulatory Visit (INDEPENDENT_AMBULATORY_CARE_PROVIDER_SITE_OTHER): Payer: BC Managed Care – PPO | Admitting: Pulmonary Disease

## 2012-12-21 ENCOUNTER — Encounter: Payer: Self-pay | Admitting: Pulmonary Disease

## 2012-12-21 VITALS — BP 130/82 | HR 76 | Temp 98.4°F | Ht 71.0 in | Wt 243.8 lb

## 2012-12-21 DIAGNOSIS — G4731 Primary central sleep apnea: Secondary | ICD-10-CM

## 2012-12-21 DIAGNOSIS — G4730 Sleep apnea, unspecified: Secondary | ICD-10-CM

## 2012-12-21 NOTE — Progress Notes (Signed)
  Subjective:    Patient ID: [name], male    DOB: [birth year], 52 y.o.   MRN: [id number]  HPI Patient comes in today for followup of his recent sleep study.  He was found to have severe complex apnea, with central spitting more prominent than obstructives.  I have reviewed this study with him in detail, and answered all of his questions.  I suspect his central venous are secondary to his ischemic cardiomyopathy, as well as his sedating medications at night.   Review of Systems  Constitutional: Negative for fever and unexpected weight change.  HENT: Positive for congestion and rhinorrhea. Negative for ear pain, nosebleeds, sore throat, sneezing, trouble swallowing, dental problem, postnasal drip and sinus pressure.   Eyes: Negative for redness and itching.  Respiratory: Positive for cough, shortness of breath and wheezing. Negative for chest tightness.   Cardiovascular: Positive for palpitations and leg swelling.  Gastrointestinal: Negative for nausea and vomiting.  Genitourinary: Negative for dysuria.  Musculoskeletal: Negative for joint swelling.  Skin: Negative for rash.  Neurological: Negative for headaches.  Hematological: Does not bruise/bleed easily.  Psychiatric/Behavioral: Positive for dysphoric mood. The patient is nervous/anxious.        Objective:   Physical Exam Obese male in no acute distress Nose with purulent discharge noted Neck without lymphadenopathy or thyromegaly Lower extremities with mild edema, no cyanosis Alert and oriented, moves all 4 extremities.       Assessment & Plan:

## 2012-12-21 NOTE — Patient Instructions (Addendum)
Will arrange for a titration study at the sleep center to work on mask fit, and also to find a device to control your apneas.  Work on weight loss. Will call you once the results are available.

## 2012-12-21 NOTE — Assessment & Plan Note (Signed)
The patient's sleep study shows severe complex apnea, with more central events and obstructive.  Given his complex apnea, I think he needs to return to the sleep center for a formal titration with CPAP initially, but a low threshold to change to ASV.  Will also work on mask fit at that time as well.

## 2012-12-27 ENCOUNTER — Encounter: Payer: Self-pay | Admitting: Internal Medicine

## 2012-12-29 ENCOUNTER — Telehealth: Payer: Self-pay | Admitting: Pulmonary Disease

## 2012-12-29 NOTE — Telephone Encounter (Signed)
PCC's did yall try calling pt to schedule his CPAP titration study thanks

## 2012-12-29 NOTE — Telephone Encounter (Signed)
Spoke to wife and pt dont see a message where anyone called him explessed to them we would call back if need to he is aware iof his study 7/10/142sleep center [name]

## 2013-01-02 ENCOUNTER — Institutional Professional Consult (permissible substitution): Payer: BC Managed Care – PPO | Admitting: Critical Care Medicine

## 2013-01-05 ENCOUNTER — Encounter: Payer: Self-pay | Admitting: Physical Medicine and Rehabilitation

## 2013-01-05 ENCOUNTER — Encounter
Payer: BC Managed Care – PPO | Attending: Physical Medicine and Rehabilitation | Admitting: Physical Medicine and Rehabilitation

## 2013-01-05 VITALS — BP 146/88 | HR 98 | Resp 16 | Ht 72.0 in | Wt 249.0 lb

## 2013-01-05 DIAGNOSIS — Q762 Congenital spondylolisthesis: Secondary | ICD-10-CM

## 2013-01-05 DIAGNOSIS — G4730 Sleep apnea, unspecified: Secondary | ICD-10-CM | POA: Insufficient documentation

## 2013-01-05 DIAGNOSIS — M545 Low back pain, unspecified: Secondary | ICD-10-CM | POA: Insufficient documentation

## 2013-01-05 DIAGNOSIS — M48062 Spinal stenosis, lumbar region with neurogenic claudication: Secondary | ICD-10-CM

## 2013-01-05 DIAGNOSIS — M47817 Spondylosis without myelopathy or radiculopathy, lumbosacral region: Secondary | ICD-10-CM | POA: Insufficient documentation

## 2013-01-05 DIAGNOSIS — M4310 Spondylolisthesis, site unspecified: Secondary | ICD-10-CM

## 2013-01-05 MED ORDER — MORPHINE SULFATE ER 100 MG PO TBCR: 100.0000 mg | EXTENDED_RELEASE_TABLET | Freq: Three times a day (TID) | ORAL | Status: DC

## 2013-01-05 MED ORDER — MORPHINE SULFATE 30 MG PO TABS: 30.0000 mg | ORAL_TABLET | Freq: Four times a day (QID) | ORAL | Status: DC | PRN

## 2013-01-05 NOTE — Patient Instructions (Signed)
Stay as active as tolerated. 

## 2013-01-05 NOTE — Progress Notes (Signed)
Subjective:    Patient ID: [name], male    DOB: [birth year], 52 y.o.   MRN: [id number]  HPI The patient is a 52 year old male, who presents with severe spinal spondylosis and HF . The patient complains about moderate to severe pain in his lower back and LE bilateral . Taking his pain medications , and being in a flexed position alleviate the symptoms. Prolonged standing or walking aggrevates the symptoms. The patient grades his pain as a 6 /10. He has seen Dr. Noel Gerold, who suggested to place an X-stop, into his L-spine. Dr. Noel Gerold ordered a CT-myelogram, which showed severe spinal stenosis worse at L4-5. The patient also reports that he had a sleep study done and was diagnosed with severe complex sleep apnea. He will receive a C-pap soon.  Pain Inventory  Average Pain 8  Pain Right Now 8  My pain is burning and stabbing  In the last 24 hours, has pain interfered with the following?  General activity 7  Relation with others 7  Enjoyment of life 5  What TIME of day is your pain at its worst? morning,evening,night  Sleep (in general) NA  Pain is worse with: walking  Pain improves with: medication  Relief from Meds: 3  Mobility  use a walker  how many minutes can you walk? 10-15  ability to climb steps? yes  do you drive? yes  Do you have any goals in this area? yes  Function  retired  I need assistance with the following: feeding, dressing, meal prep, household duties and shopping  Neuro/Psych  bladder control problems  weakness  numbness  trouble walking  anxiety  Prior Studies  Any changes since last visit? no  Physicians involved in your care  Any changes since last visit? no   Family History  Problem Relation Age of Onset  . Heart disease Father 25    Died of MI   History   Social History  . Marital Status: Married    Spouse Name: N/A    Number of Children: N/A  . Years of Education: N/A   Social History Main Topics  . Smoking status: Current Every Day  Smoker -- 0.30 packs/day for 25 years  . Smokeless tobacco: Current User     Comment: using e cigarette now  . Alcohol Use: No  . Drug Use: Yes    Special: Marijuana     Comment: Urine showed THC  . Sexually Active: None   Other Topics Concern  . None   Social History Narrative  . None   Past Surgical History  Procedure Laterality Date  . Hernia repair    . Asd repair, sinus venosus    . Testicle surgery      testicular cancer surgery  . Cardiac defibrillator placement  08/2010   Past Medical History  Diagnosis Date  . Chronic systolic heart failure     EF 30-86%. s/p ST. Jude ICD  . CAD (coronary artery disease)     Last cath 2/12. 3-v CAD. Failed PCI of distal RCAc  CATH [facility] 4/13 with DES to  LAD X2  . Chronic back pain     lumbar stenosis  . Benign prostatic hypertrophy   . Depression   . History of testicular cancer   . Narcotic dependence     chronic  . Benzodiazepine dependence     chronic  . Anxiety   . CHF (congestive heart failure)   . DJD (degenerative joint disease)   .  Automatic implantable cardiac defibrillator [employer]     Analyze ST   BP 146/88  Pulse 98  Resp 16  Ht 6' (1.829 m)  Wt 249 lb (112.946 kg)  BMI 33.76 kg/m2  SpO2 93%     Review of Systems  All other systems reviewed and are negative.       Objective:   Physical Exam Constitutional: He is oriented to person, place, and time. He appears well-developed.  Walks with walker  HENT:  Head: Normocephalic.  Eyes: Pupils are equal, round, and reactive to light.  Neck: Normal range of motion.  Neurological: He is alert and oriented to person, place, and time. He has normal reflexes.  Skin: Skin is warm and dry.  Psychiatric: He has a normal mood and affect.  Symmetric normal motor tone is noted throughout. Normal muscle bulk. Muscle testing reveals 5/5 muscle strength of the upper extremity, and 5/5 of the lower extremity, except right iliopsoas 4/5, and left tibialis anterior  4-/5. Full range of motion in upper and lower extremities. ROM of spine is Restricted in extension. Fine motor movements are normal in both hands.  Sensory is intact and symmetric to light touch, pinprick and proprioception.  DTR in the upper and lower extremity are present and symmetric 2+. No clonus is noted.  Patient arises from chair with slight difficulty. Narrow based gait with a walker, forward flexed spine.  Tenderness paraspinal muscles in L-spine.        Assessment & Plan:  1. Lumbar spinal stenosis with neurogenic claudication. He would benefit from decompressive surgery however the patient has coronary artery disease as well as cardiomyopathy. He therefore is no candidate for surgery at this point. He is reportedly on the heart transplant list at [facility]. He recently underwent angioplasty and stenting of 2 vessels. He plans to talk to his cardiologist at [facility] once again about his surgical risk. The earliest they could consider surgery would be in April of next year, one year after he had his medicated stents placed.  Will continue his current pain medications. Refilled his medication.  Patient is considering applying for disability in the near future.  Patient has appointment with Dr. Noel Gerold for an evaluation of his spine. Dr. Noel Gerold suggested to place an X-stop, into his L-spine. Dr. Noel Gerold ordered a CT-myelogram, which showed :  L2-3: Mild multifactorial stenosis without apparent neural  compression.  L3-4: Severe multifactorial stenosis. 2 mm of anterolisthesis  with flexion.  L4-5: Very severe multifactorial stenosis. 6 mm of  anterolisthesis that increases to 9 mm with flexion and reduces to  3 mm with extension.  L5-S1: Poor contrast opacity because of the stenosis at L4-5.  Disc degeneration and facet degeneration. 2 mm of anterolisthesis  with flexion that reduces with extension.  2. Severe complex sleep apnea syndrome, patient will receive C-PAP soon. Refilled his morphine MS  Contin 100mg  tid, # 90 and MSIR 30mg  q 6 hrs prn break through pain #120 Follow up in one month with PA.

## 2013-01-09 ENCOUNTER — Ambulatory Visit: Payer: BC Managed Care – PPO | Admitting: Physical Medicine and Rehabilitation

## 2013-01-11 ENCOUNTER — Telehealth (HOSPITAL_COMMUNITY): Payer: Self-pay | Admitting: *Deleted

## 2013-01-11 DIAGNOSIS — I5022 Chronic systolic (congestive) heart failure: Secondary | ICD-10-CM

## 2013-01-11 MED ORDER — SPIRONOLACTONE 25 MG PO TABS: 12.5000 mg | ORAL_TABLET | Freq: Every day | ORAL | Status: DC

## 2013-01-11 MED ORDER — TORSEMIDE 20 MG PO TABS: 20.0000 mg | ORAL_TABLET | Freq: Every day | ORAL | Status: DC | PRN

## 2013-01-11 NOTE — Telephone Encounter (Signed)
Pt called and stated he realized he was off his Cleda Daub and has been for about a year, he had been on Spiro 25 for years but somehow had missed getting it refilled and hasn't had it in about a year per pharmacy, per Dr Gala Romney restart it at 12.5 mg daily and recheck lab in 1 week pt aware and verbalizes understanding, he will go to [place] next [place] or fri

## 2013-01-14 ENCOUNTER — Other Ambulatory Visit: Payer: Self-pay | Admitting: Internal Medicine

## 2013-01-19 ENCOUNTER — Ambulatory Visit (HOSPITAL_BASED_OUTPATIENT_CLINIC_OR_DEPARTMENT_OTHER): Payer: BC Managed Care – PPO | Attending: Pulmonary Disease | Admitting: Radiology

## 2013-01-19 VITALS — Ht 72.0 in | Wt 243.0 lb

## 2013-01-19 DIAGNOSIS — G4731 Primary central sleep apnea: Secondary | ICD-10-CM

## 2013-01-19 DIAGNOSIS — G4710 Hypersomnia, unspecified: Secondary | ICD-10-CM | POA: Insufficient documentation

## 2013-01-25 DIAGNOSIS — G4730 Sleep apnea, unspecified: Secondary | ICD-10-CM

## 2013-01-25 DIAGNOSIS — G4710 Hypersomnia, unspecified: Secondary | ICD-10-CM

## 2013-01-25 NOTE — Procedures (Signed)
[name], [name]                ACCOUNT NO.:  [id number]  MEDICAL RECORD NO.:  [id number]          PATIENT TYPE:  OUT  LOCATION:  SLEEP CENTER                 FACILITY:  [facility]  PHYSICIAN:  Kathee Delton, MD,FCCPDATE OF BIRTH:  1959/09/11  DATE OF STUDY:  01/19/2013                           NOCTURNAL POLYSOMNOGRAM  REFERRING PHYSICIAN:  Kathee Delton, MD,FCCP  INDICATION FOR STUDY:  Hypersomnia with sleep apnea.  The patient has been diagnosed with complex sleep apnea and returns for pressure optimization.  EPWORTH SLEEPINESS SCORE:  15.  MEDICATIONS:  SLEEP ARCHITECTURE:  The patient had a total sleep time of 274 minutes with no slow-wave sleep and decreased quantity of REM.  Sleep onset latency was normal at 12 minutes and REM onset was normal at 63 minutes. Sleep efficiency was moderately reduced at 74%.  RESPIRATORY DATA:  The patient underwent a CPAP titration study with a large ResMed AirFit F10 full-face mask.  Pressure was increased in order to control both obstructive and central events, as well as snoring.  The patient was titrated as high as a CPAP pressure of 12 cm, with pressure induced central apneas noted.  It was then changed to bilevel, but continued to have significant central events.  The patient was subsequently changed to ASV, and this showed excellent control of all of his obstructive and central events.  The patient had an EPAP pressure of 6 cm of water, and his pressure support ranged from 3 to 15 cm.  He had excellent control even through supine REM.  OXYGEN DATA:  O2 desaturation as low as 83% was noted.  CARDIAC DATA:  Occasional PVC noted, but no clinically significant arrhythmias were seen.  MOVEMENT-PARASOMNIA:  The patient had no significant leg jerks or other abnormal behaviors noted.  IMPRESSIONS-RECOMMENDATIONS: 1. Good control of previously diagnosed complex apnea with an ASV device, and     delivered by a large ResMed AirFit F10  full-face mask.  The patient     failed CPAP and BiPAP because of worsening central apnea, and     ultimately required an EPAP pressure of 6 cm of water and a     pressure support between 3 and 15 cm of water.  He had total     resolution of his events even through supine REM on this setting.     He should also be encouraged to work     aggressively on weight loss. 2. Occasional PVC noted, but no clinically significant arrhythmias     were seen.     Kathee Delton, MD,FCCP [place], [place] Board of Sleep Medicine    [facility]/MEDQ  D:  01/25/2013 73:41:93  T:  01/25/2013 79:02:40  Job:  [id number]

## 2013-01-26 ENCOUNTER — Telehealth: Payer: Self-pay | Admitting: Pulmonary Disease

## 2013-01-26 DIAGNOSIS — G4731 Primary central sleep apnea: Secondary | ICD-10-CM

## 2013-01-26 NOTE — Telephone Encounter (Signed)
Called pt on mobile number on record, and got a beep like a fax machine.  Unsure if this is correct. Called home number, and LMOM for him to call us back and leave a number where we can reach him during the day.  Will discuss his sleep test with him then.

## 2013-01-31 ENCOUNTER — Ambulatory Visit: Payer: BC Managed Care – PPO | Admitting: Physical Medicine and Rehabilitation

## 2013-02-02 ENCOUNTER — Institutional Professional Consult (permissible substitution): Payer: BC Managed Care – PPO | Admitting: Critical Care Medicine

## 2013-02-02 ENCOUNTER — Telehealth: Payer: Self-pay | Admitting: Pulmonary Disease

## 2013-02-02 NOTE — Telephone Encounter (Signed)
Called and spoke with pt and he is aware that [facility] is out of the office for the next 2 wks.  Lamar pt wanted to give you the numbers that he can be reached at.

## 2013-02-08 ENCOUNTER — Encounter
Payer: BC Managed Care – PPO | Attending: Physical Medicine and Rehabilitation | Admitting: Physical Medicine and Rehabilitation

## 2013-02-08 ENCOUNTER — Encounter: Payer: Self-pay | Admitting: Physical Medicine and Rehabilitation

## 2013-02-08 VITALS — BP 126/80 | HR 73 | Resp 14 | Ht 72.0 in | Wt 244.6 lb

## 2013-02-08 DIAGNOSIS — G4730 Sleep apnea, unspecified: Secondary | ICD-10-CM | POA: Insufficient documentation

## 2013-02-08 DIAGNOSIS — M47817 Spondylosis without myelopathy or radiculopathy, lumbosacral region: Secondary | ICD-10-CM | POA: Insufficient documentation

## 2013-02-08 DIAGNOSIS — M48062 Spinal stenosis, lumbar region with neurogenic claudication: Secondary | ICD-10-CM

## 2013-02-08 DIAGNOSIS — M79609 Pain in unspecified limb: Secondary | ICD-10-CM | POA: Insufficient documentation

## 2013-02-08 MED ORDER — MORPHINE SULFATE 30 MG PO TABS: 30.0000 mg | ORAL_TABLET | Freq: Four times a day (QID) | ORAL | Status: DC | PRN

## 2013-02-08 MED ORDER — MORPHINE SULFATE ER 100 MG PO TBCR: 100.0000 mg | EXTENDED_RELEASE_TABLET | Freq: Three times a day (TID) | ORAL | Status: DC

## 2013-02-08 NOTE — Progress Notes (Signed)
Subjective:    Patient ID: [name], male    DOB: 1960/08/27, 52 y.o.   MRN: [id number]  HPI The patient is a 52 year old male, who presents with severe spinal spondylosis and HF . The patient complains about moderate to severe pain in his lower back and LE bilateral . Taking his pain medications , and being in a flexed position alleviate the symptoms. Prolonged standing or walking aggrevates the symptoms. The patient grades his pain as a 6 /10. He has seen Dr. Noel Gerold, who suggested to place an X-stop, into his L-spine. Dr. Noel Gerold ordered a CT-myelogram, which showed severe spinal stenosis worse at L4-5. The patient also reports that he had a sleep study done and was diagnosed with severe complex sleep apnea. He is following up with his pulmonologist on this. He went to a PT once, but was not very happy with the treatment, he would like to go back to another office where he was before.   Pain Inventory Average Pain 8 Pain Right Now 8 My pain is burning and stabbing  In the last 24 hours, has pain interfered with the following? General activity 7 Relation with others 7 Enjoyment of life 5 What TIME of day is your pain at its worst? morning evening and night Sleep (in general) Poor  Pain is worse with: walking Pain improves with: medication Relief from Meds: 3  Mobility use a walker how many minutes can you walk? 10-15  ability to climb steps?  yes do you drive?  yes  Function retired I need assistance with the following:  dressing, meal prep, household duties and shopping  Neuro/Psych bladder control problems weakness numbness trouble walking anxiety  Prior Studies Any changes since last visit?  no  Physicians involved in your care Any changes since last visit?  no   Family History  Problem Relation Age of Onset  . Heart disease Father 51    Died of MI   History   Social History  . Marital Status: Married    Spouse Name: N/A    Number of Children: N/A  .  Years of Education: N/A   Social History Main Topics  . Smoking status: Current Every Day Smoker -- 0.30 packs/day for 25 years  . Smokeless tobacco: Current User     Comment: using e cigarette now  . Alcohol Use: No  . Drug Use: Yes    Special: Marijuana     Comment: Urine showed THC  . Sexually Active: None   Other Topics Concern  . None   Social History Narrative  . None   Past Surgical History  Procedure Laterality Date  . Hernia repair    . Asd repair, sinus venosus    . Testicle surgery      testicular cancer surgery  . Cardiac defibrillator placement  08/2010   Past Medical History  Diagnosis Date  . Chronic systolic heart failure     EF 78-29%. s/p ST. Jude ICD  . CAD (coronary artery disease)     Last cath 2/12. 3-v CAD. Failed PCI of distal RCAc  CATH [facility] 4/13 with DES to  LAD X2  . Chronic back pain     lumbar stenosis  . Benign prostatic hypertrophy   . Depression   . History of testicular cancer   . Narcotic dependence     chronic  . Benzodiazepine dependence     chronic  . Anxiety   . CHF (congestive heart failure)   .  DJD (degenerative joint disease)   . Automatic implantable cardiac defibrillator [employer]     Analyze ST   BP 126/80  Pulse 73  Resp 14  Ht 6' (1.829 m)  Wt 244 lb 9.6 oz (110.95 kg)  BMI 33.17 kg/m2  SpO2 94%   Review of Systems  Musculoskeletal: Positive for gait problem.  All other systems reviewed and are negative.       Objective:   Physical Exam Constitutional: He is oriented to person, place, and time. He appears well-developed.  Walks with walker  HENT:  Head: Normocephalic.  Eyes: Pupils are equal, round, and reactive to light.  Neck: Normal range of motion.  Neurological: He is alert and oriented to person, place, and time. He has normal reflexes.  Skin: Skin is warm and dry.  Psychiatric: He has a normal mood and affect.  Symmetric normal motor tone is noted throughout. Normal muscle bulk. Muscle testing  reveals 5/5 muscle strength of the upper extremity, and 5/5 of the lower extremity, except right iliopsoas 4/5, and left tibialis anterior 4-/5. Full range of motion in upper and lower extremities. ROM of spine is Restricted in extension. Fine motor movements are normal in both hands.  Sensory is intact and symmetric to light touch, pinprick and proprioception.  DTR in the upper and lower extremity are present and symmetric 2+. No clonus is noted.  Patient arises from chair with slight difficulty. Narrow based gait with a walker, forward flexed spine.  Tenderness paraspinal muscles in L-spine.        Assessment & Plan:  1. Lumbar spinal stenosis with neurogenic claudication. He would benefit from decompressive surgery however the patient has coronary artery disease as well as cardiomyopathy. He therefore is no candidate for surgery at this point. He is reportedly on the heart transplant list at [facility]. He recently underwent angioplasty and stenting of 2 vessels. He plans to talk to his cardiologist at [facility] once again about his surgical risk. The earliest they could consider surgery would be in April of next year, one year after he had his medicated stents placed.  Will continue his current pain medications. Refilled his medication.  Patient is considering applying for disability in the near future.  Patient has appointment with Dr. Noel Gerold for an evaluation of his spine. Dr. Noel Gerold suggested to place an X-stop, into his L-spine. Dr. Noel Gerold ordered a CT-myelogram, which showed :  L2-3: Mild multifactorial stenosis without apparent neural  compression.  L3-4: Severe multifactorial stenosis. 2 mm of anterolisthesis  with flexion.  L4-5: Very severe multifactorial stenosis. 6 mm of  anterolisthesis that increases to 9 mm with flexion and reduces to  3 mm with extension.  L5-S1: Poor contrast opacity because of the stenosis at L4-5.  Disc degeneration and facet degeneration. 2 mm of anterolisthesis   with flexion that reduces with extension.  2. Severe complex sleep apnea syndrome, patient will receive C or E-PAP soon.  Refilled his morphine MS Contin 100mg  tid, # 90 and MSIR 30mg  q 6 hrs prn break through pain #120  Ordered PT to strengthen his core, again, also offered to talk to his PT about the appropriate exercising program for his condition. Follow up in one month with PA.

## 2013-02-08 NOTE — Patient Instructions (Signed)
Try to stay as active as tolerated 

## 2013-02-10 ENCOUNTER — Encounter: Payer: Self-pay | Admitting: Internal Medicine

## 2013-02-20 ENCOUNTER — Ambulatory Visit: Payer: BC Managed Care – PPO | Admitting: Internal Medicine

## 2013-02-21 NOTE — Telephone Encounter (Signed)
Please let pt know that his apnea was not controlled on a cpap or a bipap device.  He was then changed to ASV device and had a great response.  See if he is ok with starting on this device and I can arrange.  Will need to see me in 6 weeks to check on progress.

## 2013-02-23 NOTE — Telephone Encounter (Signed)
LMOM x 1 

## 2013-02-24 NOTE — Telephone Encounter (Signed)
ATC patient on both #s provided no answer LMOMTCB on mobile #

## 2013-02-27 ENCOUNTER — Encounter: Payer: Self-pay | Admitting: Internal Medicine

## 2013-02-27 ENCOUNTER — Institutional Professional Consult (permissible substitution): Payer: BC Managed Care – PPO | Admitting: Critical Care Medicine

## 2013-02-27 ENCOUNTER — Telehealth: Payer: Self-pay | Admitting: Pulmonary Disease

## 2013-02-27 ENCOUNTER — Ambulatory Visit (INDEPENDENT_AMBULATORY_CARE_PROVIDER_SITE_OTHER): Payer: BC Managed Care – PPO | Admitting: *Deleted

## 2013-02-27 DIAGNOSIS — I5042 Chronic combined systolic (congestive) and diastolic (congestive) heart failure: Secondary | ICD-10-CM

## 2013-02-27 NOTE — Telephone Encounter (Signed)
Kathee Delton, MD at 02/21/2013 7:26 PM   Status: Signed            Please let pt know that his apnea was not controlled on a cpap or a bipap device. He was then changed to ASV device and had a great response. See if he is ok with starting on this device and I can arrange. Will need to see me in 6 weeks to check on progress.    Results have been explained to patient, pt expressed understanding.  Pt is okay with moving forward with new ASV machine. Pt scheduled for 6 week f/u: 04/03/13 at 930

## 2013-02-27 NOTE — Progress Notes (Signed)
icd check in clinic by [facility]. Normal device function. No episodes recorded.

## 2013-02-28 ENCOUNTER — Other Ambulatory Visit: Payer: Self-pay | Admitting: Pulmonary Disease

## 2013-02-28 DIAGNOSIS — G4731 Primary central sleep apnea: Secondary | ICD-10-CM

## 2013-02-28 DIAGNOSIS — G4739 Other sleep apnea: Secondary | ICD-10-CM

## 2013-02-28 NOTE — Telephone Encounter (Signed)
I spoke with pt. He is okay with ordering ASV. Please advise KC thanks

## 2013-02-28 NOTE — Telephone Encounter (Signed)
Order has been sent for ASV.

## 2013-03-03 LAB — ICD DEVICE OBSERVATION
AL AMPLITUDE: 5 mv
AL IMPEDENCE ICD: 512.5 Ohm
AL THRESHOLD: 0.5 V
ATRIAL PACING ICD: 0.4 pct
BAMS-0001: 180 {beats}/min
BAMS-0003: 70 {beats}/min
CHARGE TIME: 9.5 s
DEV-0020ICD: NEGATIVE
DEVICE MODEL ICD: 815096
FVT: 0
HV IMPEDENCE: 84 Ohm
MODE SWITCH EPISODES: 0
PACEART VT: 0
RV LEAD AMPLITUDE: 12 mv
RV LEAD IMPEDENCE ICD: 350 Ohm
RV LEAD THRESHOLD: 2 V
TOT-0006: 20120201000000
TOT-0007: 2
TOT-0008: 0
TOT-0009: 1
TOT-0010: 9
TZAT-0001FASTVT: 1
TZAT-0004FASTVT: 8
TZAT-0012FASTVT: 200 ms
TZAT-0013FASTVT: 3
TZAT-0018FASTVT: NEGATIVE
TZAT-0019FASTVT: 7.5 V
TZAT-0020FASTVT: 1 ms
TZON-0003FASTVT: 340 ms
TZON-0003SLOWVT: 400 ms
TZON-0004FASTVT: 35
TZON-0004SLOWVT: 35
TZON-0005FASTVT: 6
TZON-0005SLOWVT: 6
TZON-0010FASTVT: 40 ms
TZON-0010SLOWVT: 40 ms
TZST-0001FASTVT: 2
TZST-0001FASTVT: 3
TZST-0001FASTVT: 4
TZST-0003FASTVT: 30 J
TZST-0003FASTVT: 40 J
TZST-0003FASTVT: 40 J
VENTRICULAR PACING ICD: 0.07 pct
VF: 0

## 2013-03-06 ENCOUNTER — Institutional Professional Consult (permissible substitution): Payer: BC Managed Care – PPO | Admitting: Critical Care Medicine

## 2013-03-09 ENCOUNTER — Encounter
Payer: BC Managed Care – PPO | Attending: Physical Medicine and Rehabilitation | Admitting: Physical Medicine and Rehabilitation

## 2013-03-09 ENCOUNTER — Encounter: Payer: Self-pay | Admitting: Physical Medicine and Rehabilitation

## 2013-03-09 VITALS — BP 137/81 | HR 77 | Resp 14 | Ht 72.0 in | Wt 243.4 lb

## 2013-03-09 DIAGNOSIS — M48062 Spinal stenosis, lumbar region with neurogenic claudication: Secondary | ICD-10-CM | POA: Insufficient documentation

## 2013-03-09 DIAGNOSIS — Z9861 Coronary angioplasty status: Secondary | ICD-10-CM | POA: Insufficient documentation

## 2013-03-09 DIAGNOSIS — I428 Other cardiomyopathies: Secondary | ICD-10-CM | POA: Insufficient documentation

## 2013-03-09 DIAGNOSIS — G4730 Sleep apnea, unspecified: Secondary | ICD-10-CM | POA: Insufficient documentation

## 2013-03-09 DIAGNOSIS — I2510 Atherosclerotic heart disease of native coronary artery without angina pectoris: Secondary | ICD-10-CM | POA: Insufficient documentation

## 2013-03-09 MED ORDER — MORPHINE SULFATE 30 MG PO TABS: 30.0000 mg | ORAL_TABLET | Freq: Four times a day (QID) | ORAL | Status: DC | PRN

## 2013-03-09 MED ORDER — MORPHINE SULFATE ER 100 MG PO TBCR: 100.0000 mg | EXTENDED_RELEASE_TABLET | Freq: Three times a day (TID) | ORAL | Status: DC

## 2013-03-09 NOTE — Progress Notes (Signed)
Subjective:    Patient ID: [name], male    DOB: 06/20/1961, 52 y.o.   MRN: [id number]  HPI The patient is a 52 year old male, who presents with severe spinal spondylosis and HF . The patient complains about moderate to severe pain in his lower back and LE bilateral . Taking his pain medications , and being in a flexed position alleviate the symptoms. Prolonged standing or walking aggrevates the symptoms. The patient grades his pain as a 6 /10. He has seen Dr. Noel Gerold, who suggested to place an X-stop, into his L-spine. Dr. Noel Gerold ordered a CT-myelogram, which showed severe spinal stenosis worse at L4-5. The patient also reports that he had a sleep study done and was diagnosed with severe complex sleep apnea. He is following up with his pulmonologist on this.    Pain Inventory Average Pain 8 Pain Right Now 8 My pain is sharp and burning  In the last 24 hours, has pain interfered with the following? General activity 2 Relation with others 2 Enjoyment of life 4 What TIME of day is your pain at its worst? day, evening Sleep (in general) Poor  Pain is worse with: walking and standing Pain improves with: therapy/exercise and medication Relief from Meds: 6  Mobility walk with assistance use a cane use a walker ability to climb steps?  yes do you drive?  yes use a wheelchair  Function employed # of hrs/week 30 disabled: date disabled na I need assistance with the following:  dressing, meal prep, household duties and shopping  Neuro/Psych numbness anxiety  Prior Studies Any changes since last visit?  no  Physicians involved in your care Any changes since last visit?  no   Family History  Problem Relation Age of Onset  . Heart disease Father 29    Died of MI   History   Social History  . Marital Status: Married    Spouse Name: N/A    Number of Children: N/A  . Years of Education: N/A   Social History Main Topics  . Smoking status: Current Every Day Smoker --  0.30 packs/day for 25 years  . Smokeless tobacco: Current User     Comment: using e cigarette now  . Alcohol Use: No  . Drug Use: Yes    Special: Marijuana     Comment: Urine showed THC  . Sexual Activity: None   Other Topics Concern  . None   Social History Narrative  . None   Past Surgical History  Procedure Laterality Date  . Hernia repair    . Asd repair, sinus venosus    . Testicle surgery      testicular cancer surgery  . Cardiac defibrillator placement  08/2010   Past Medical History  Diagnosis Date  . Chronic systolic heart failure     EF 40-98%. s/p ST. Jude ICD  . CAD (coronary artery disease)     Last cath 2/12. 3-v CAD. Failed PCI of distal RCAc  C[facility] 4/13 with DES to  LAD X2  . Chronic back pain     lumbar stenosis  . Benign prostatic hypertrophy   . Depression   . History of testicular cancer   . Narcotic dependence     chronic  . Benzodiazepine dependence     chronic  . Anxiety   . CHF (congestive heart failure)   . DJD (degenerative joint disease)   . Automatic implantable cardiac defibrillator [employer]     Analyze ST  BP 137/81  Pulse 77  Resp 14  Ht 6' (1.829 m)  Wt 243 lb 6.4 oz (110.406 kg)  BMI 33 kg/m2  SpO2 94%      Review of Systems  Constitutional: Positive for unexpected weight change.  Gastrointestinal: Positive for constipation.  Genitourinary: Positive for decreased urine volume.  Neurological: Positive for numbness.  Hematological: Bruises/bleeds easily.  All other systems reviewed and are negative.       Objective:   Physical Exam Constitutional: He is oriented to person, place, and time. He appears well-developed.  Walks with walker  HENT:  Head: Normocephalic.  Eyes: Pupils are equal, round, and reactive to light.  Neck: Normal range of motion.  Neurological: He is alert and oriented to person, place, and time. He has normal reflexes.  Skin: Skin is warm and dry.  Psychiatric: He has a normal mood and  affect.  Symmetric normal motor tone is noted throughout. Normal muscle bulk. Muscle testing reveals 5/5 muscle strength of the upper extremity, and 5/5 of the lower extremity, except right iliopsoas 4/5, and left tibialis anterior 4-/5. Full range of motion in upper and lower extremities. ROM of spine is Restricted in extension. Fine motor movements are normal in both hands.  Sensory is intact and symmetric to light touch, pinprick and proprioception.  DTR in the upper and lower extremity are present and symmetric 2+. No clonus is noted.  Patient arises from chair with slight difficulty. Narrow based gait with a walker, forward flexed spine.  Tenderness paraspinal muscles in L-spine.        Assessment & Plan:  1. Lumbar spinal stenosis with neurogenic claudication. He would benefit from decompressive surgery however the patient has coronary artery disease as well as cardiomyopathy. He therefore is no candidate for surgery at this point. He is reportedly on the heart transplant list at [facility]. He recently underwent angioplasty and stenting of 2 vessels. He plans to talk to his cardiologist at [facility] once again about his surgical risk. The earliest they could consider surgery would be in April of next year, one year after he had his medicated stents placed.  Will continue his current pain medications. Refilled his medication.  Patient is considering applying for disability in the near future.  Patient has appointment with Dr. Noel Gerold for an evaluation of his spine. Dr. Noel Gerold suggested to place an X-stop, into his L-spine. Dr. Noel Gerold ordered a CT-myelogram, which showed :  L2-3: Mild multifactorial stenosis without apparent neural  compression.  L3-4: Severe multifactorial stenosis. 2 mm of anterolisthesis  with flexion.  L4-5: Very severe multifactorial stenosis. 6 mm of  anterolisthesis that increases to 9 mm with flexion and reduces to  3 mm with extension.  L5-S1: Poor contrast opacity because of  the stenosis at L4-5.  Disc degeneration and facet degeneration. 2 mm of anterolisthesis  with flexion that reduces with extension.  2. Severe complex sleep apnea syndrome, patient will receive C or E-PAP soon.  Refilled his morphine MS Contin 100mg  tid, # 90 and MSIR 30mg  q 6 hrs prn break through pain #120  Ordered PT to strengthen his core,at his last visit , also offered to talk to his PT about the appropriate exercising program for his condition.  Follow up in one month with PA.

## 2013-03-09 NOTE — Patient Instructions (Addendum)
Stay as active as tolerated, continue with your exercise program. 

## 2013-03-15 ENCOUNTER — Telehealth: Payer: Self-pay | Admitting: Pulmonary Disease

## 2013-03-15 NOTE — Telephone Encounter (Signed)
The pt returned call and was very irate from the start of the conversation. I advised what we were told per [employer]. He then stated "what makes you think I give a ** about insurance paying for the machine."  I then asked if he wanted to pay cash for the machine and if so he should contact [employer] and let them know this and they will process the order this way. The pt then stated this is not what he wants to do. He continued to be very rude, cursing, threatening to call Saul Fordyce, the manager, etc. I offered to get a message to the manager but he refused. I again advised the pt to contact [employer] and discuss with them any issues they are having. He stated that I should contact [employer]. Again I advised that we have contacted them and again advised that they have everything they need from [place] and they are waiting on approval from insurance. The patient continued to be rude and belligerent so I advised him that I was going to end the conversation at this point because I have advised him of everything I could. Print production planner is aware of issues as well as Dr. Delford Field because the pt has an appt with him tomorrow. Carron Curie, CMA

## 2013-03-15 NOTE — Telephone Encounter (Signed)
I spoke with carol. Pt is scheduled to have set up on Friday fyi for [place]

## 2013-03-15 NOTE — Telephone Encounter (Signed)
I spoke with Rosylynn from [employer]. She stated they are still waiting for authorization from pt insurance for payment for the equipment. She stated as soon as they get this authorization from his insurance they will call pt for set up right away.   lmtcb x1 for pt

## 2013-03-16 ENCOUNTER — Ambulatory Visit (INDEPENDENT_AMBULATORY_CARE_PROVIDER_SITE_OTHER): Payer: BC Managed Care – PPO | Admitting: Critical Care Medicine

## 2013-03-16 ENCOUNTER — Encounter: Payer: Self-pay | Admitting: Critical Care Medicine

## 2013-03-16 ENCOUNTER — Ambulatory Visit (HOSPITAL_BASED_OUTPATIENT_CLINIC_OR_DEPARTMENT_OTHER)
Admission: RE | Admit: 2013-03-16 | Discharge: 2013-03-16 | Disposition: A | Discharge status: Home / Self Care | Payer: BC Managed Care – PPO | Source: Ambulatory Visit | Attending: Critical Care Medicine | Admitting: Critical Care Medicine

## 2013-03-16 VITALS — BP 100/62 | HR 74 | Ht 72.0 in | Wt 237.0 lb

## 2013-03-16 DIAGNOSIS — J449 Chronic obstructive pulmonary disease, unspecified: Secondary | ICD-10-CM

## 2013-03-16 DIAGNOSIS — J441 Chronic obstructive pulmonary disease with (acute) exacerbation: Secondary | ICD-10-CM | POA: Insufficient documentation

## 2013-03-16 DIAGNOSIS — Z95810 Presence of automatic (implantable) cardiac defibrillator: Secondary | ICD-10-CM | POA: Insufficient documentation

## 2013-03-16 DIAGNOSIS — F17200 Nicotine dependence, unspecified, uncomplicated: Secondary | ICD-10-CM

## 2013-03-16 DIAGNOSIS — Z720 Tobacco use: Secondary | ICD-10-CM

## 2013-03-16 DIAGNOSIS — I517 Cardiomegaly: Secondary | ICD-10-CM | POA: Insufficient documentation

## 2013-03-16 DIAGNOSIS — A310 Pulmonary mycobacterial infection: Secondary | ICD-10-CM

## 2013-03-16 MED ORDER — AZITHROMYCIN 250 MG PO TABS: 250.0000 mg | ORAL_TABLET | Freq: Every day | ORAL | Status: DC

## 2013-03-16 MED ORDER — BECLOMETHASONE DIPROPIONATE 40 MCG/ACT IN AERS: 1.0000 | INHALATION_SPRAY | Freq: Two times a day (BID) | RESPIRATORY_TRACT | Status: DC

## 2013-03-16 NOTE — Progress Notes (Signed)
Subjective:    Patient ID: [name], male    DOB: 1960/08/09, 52 y.o.   MRN: [id number]  HPI 51 y.o.M This patient was last seen in this clinic in 2008 and at that time was diagnosed with this obstructive sleep apnea and resolved community acquired pneumonia Over the interval time the patient's had episodes of increasing cough with brown mucus worse in the mornings. There was a question of Mycobacterium avium intracellular a in the lungs but only was a colonizer in the sputum. The patient used to smoke on a daily basis. The patient moved away from [place] only to return several years ago. The patient now has a cardiomyopathy is being followed by cardiology for this. Patient notes progressive dyspnea with exertion and cyclical cough. Note the patient is on an ACE inhibitor at this time. Ejection fraction is at 20/25 percent range. The mucus color is dark brown in nature and is worse early in the mornings.  The patient does have a complex obstructive sleep apnea and is followed by sleep medicine. The patient does have a bilevel device which she is going to be set up on the results of the sleep study.  The patient returns now for to reestablish  Past Medical History  Diagnosis Date  . Chronic systolic heart failure     EF 20-25%. s/p ST. Jude ICD  . CAD (coronary artery disease)     Last cath 2/12. 3-v CAD. Failed PCI of distal RCAc  CATH [facility] 4/13 with DES to  LAD X2  . Chronic back pain     lumbar stenosis  . Benign prostatic hypertrophy   . Depression   . History of testicular cancer   . Narcotic dependence     chronic  . Benzodiazepine dependence     chronic  . Anxiety   . CHF (congestive heart failure)   . DJD (degenerative joint disease)   . Automatic implantable cardiac defibrillator [employer]     Analyze ST     Family History  Problem Relation Age of Onset  . Heart disease Father 79    Died of MI     History   Social History  . Marital Status: Married     Spouse Name: N/A    Number of Children: N/A  . Years of Education: N/A   Occupational History  . Not on file.   Social History Main Topics  . Smoking status: Current Every Day Smoker -- 1.50 packs/day for 29 years    Types: Cigarettes  . Smokeless tobacco: Never Used     Comment: using e cigarette now  . Alcohol Use: No  . Drug Use: Yes    Special: Marijuana     Comment: Urine showed THC  . Sexual Activity: Not on file   Other Topics Concern  . Not on file   Social History Narrative  . No narrative on file     Allergies  Allergen Reactions  . Darvocet [Propoxyphene-Acetaminophen] Anaphylaxis    Throat closes     Outpatient Prescriptions Prior to Visit  Medication Sig Dispense Refill  . aspirin 325 MG tablet Take 325 mg by mouth 2 (two) times daily.       . clopidogrel (PLAVIX) 75 MG tablet Take 1 tablet (75 mg total) by mouth daily.  30 tablet  6  . CRESTOR 40 MG tablet TAKE 1 TABLET DAILY.  30 tablet  12  . diazepam (VALIUM) 10 MG tablet Take 10 mg by  mouth 3 (three) times daily. For muscle spasms      . ezetimibe (ZETIA) 10 MG tablet Take 1 tablet (10 mg total) by mouth daily.  30 tablet  6  . lactulose (CHRONULAC) 10 GM/15ML solution Take 20 g by mouth daily as needed. For constipation      . lisinopril (PRINIVIL,ZESTRIL) 10 MG tablet Take 15 mg by mouth daily.       Marland Kitchen LORazepam (ATIVAN) 1 MG tablet Take 1 mg by mouth every 6 (six) hours as needed. For anxiety      . morphine (MS CONTIN) 100 MG 12 hr tablet Take 1 tablet (100 mg total) by mouth 3 (three) times daily.  90 tablet  0  . morphine (MSIR) 30 MG tablet Take 1 tablet (30 mg total) by mouth every 6 (six) hours as needed. For pain  120 tablet  0  . nystatin (MYCOSTATIN) 100000 UNIT/ML suspension Take 500,000 Units by mouth. Use every 5 mins for no more than 15 mins for chest pain PRN      . sildenafil (VIAGRA) 100 MG tablet Take 1 tablet (100 mg total) by mouth as needed for erectile dysfunction.  10 tablet  6   . spironolactone (ALDACTONE) 25 MG tablet Take 0.5 tablets (12.5 mg total) by mouth daily.  15 tablet  3  . testosterone cypionate (DEPOTESTOTERONE CYPIONATE) 200 MG/ML injection Inject into the muscle every 14 (fourteen) days.        Marland Kitchen torsemide (DEMADEX) 20 MG tablet Take 1 tablet (20 mg total) by mouth daily as needed. For swelling in legs  30 tablet  6  . zolpidem (AMBIEN) 10 MG tablet Take 10 mg by mouth at bedtime as needed. For sleep      . carvedilol (COREG) 12.5 MG tablet Take 1.5 tablets (18.75 mg total) by mouth 2 (two) times daily with a meal.  90 tablet  6  . gabapentin (NEURONTIN) 600 MG tablet Take 800 mg by mouth 4 (four) times daily.        No facility-administered medications prior to visit.      Review of Systems Constitutional:   No  weight loss, night sweats,  Fevers, chills, fatigue, lassitude. HEENT:   No headaches,  Difficulty swallowing,  Tooth/dental problems,  Sore throat,                No sneezing, itching, ear ache, nasal congestion, post nasal drip,   CV:  No chest pain,  Orthopnea, PND, swelling in lower extremities, anasarca, dizziness, palpitations  GI  No heartburn, indigestion, abdominal pain, nausea, vomiting, diarrhea, change in bowel habits, loss of appetite  Resp: Notes shortness of breath with exertion and at rest.  No excess mucus, notes productive cough,  Notes non-productive cough,  No coughing up of blood.  No change in color of mucus.  No wheezing.  No chest wall deformity  Skin: no rash or lesions.  GU: no dysuria, change in color of urine, no urgency or frequency.  No flank pain.  MS:  No joint pain or swelling.  No decreased range of motion.  No back pain.  Psych:  No change in mood or affect. No depression or anxiety.  No memory loss.     Objective:   Physical Exam Filed Vitals:   03/16/13 1117  BP: 100/62  Pulse: 74  Height: 6' (1.829 m)  Weight: 237 lb (107.502 kg)  SpO2: 93%    Gen: Pleasant, well-nourished, in no  distress,  normal  affect  ENT: No lesions,  mouth clear,  oropharynx clear, no postnasal drip  Neck: No JVD, no TMG, no carotid bruits  Lungs: No use of accessory muscles, no dullness to percussion, scattered rhonchi  Cardiovascular: RRR, heart sounds normal, no murmur or gallops, no peripheral edema  Abdomen: soft and NT, no HSM,  BS normal  Musculoskeletal: No deformities, no cyanosis or clubbing  Neuro: alert, non focal  Skin: Warm, no lesions or rashes  No results found. Spirometry on 03/16/2013 shows normal spirometry with FEV1 and FVC greater than 100% per day     Assessment & Plan:   Obstructive chronic bronchitis with exacerbation Asthmatic bronchitis with associated acute flare with well-preserved lung function on spirometry Ongoing tobacco use Plan Begin Qvar 2 puff twice daily 40 mcg strength Administer azithromycin for 5 days Smoking cessation was advised utilizing nicotine replacement therapy Followup with bilevel support at night for severe complex sleep apnea was advised    Tobacco abuse There is ongoing tobacco use and the patient was given 3-10 minutes of smoking cessation counts  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 aspirin 325 MG tablet Take 325 mg by mouth 2 (two) times daily.       . carvedilol (COREG) 12.5 MG tablet Take 12.5 mg by mouth 2 (two) times daily with a meal.      . clopidogrel (PLAVIX) 75 MG tablet Take 1 tablet (75 mg total) by mouth daily.  30 tablet  6  . CRESTOR 40 MG tablet TAKE 1 TABLET DAILY.  30 tablet  12  . diazepam (VALIUM) 10 MG tablet Take 10 mg by mouth 3 (three) times daily. For muscle spasms      . ezetimibe (ZETIA) 10 MG tablet Take 1 tablet (10 mg total) by mouth daily.  30 tablet  6  . lactulose  (CHRONULAC) 10 GM/15ML solution Take 20 g by mouth daily as needed. For constipation      . lisinopril (PRINIVIL,ZESTRIL) 10 MG tablet Take 15 mg by mouth daily.       Marland Kitchen LORazepam (ATIVAN) 1 MG tablet Take 1 mg by mouth every 6 (six) hours as needed. For anxiety      . morphine (MS CONTIN) 100 MG 12 hr tablet Take 1 tablet (100 mg total) by mouth 3 (three) times daily.  90 tablet  0  . morphine (MSIR) 30 MG tablet Take 1 tablet (30 mg total) by mouth every 6 (six) hours as needed. For pain  120 tablet  0  . nystatin (MYCOSTATIN) 100000 UNIT/ML suspension Take 500,000 Units by mouth. Use every 5 mins for no more than 15 mins for chest pain PRN      . sildenafil (VIAGRA) 100 MG tablet Take 1 tablet (100 mg total) by mouth as needed for erectile dysfunction.  10 tablet  6  . spironolactone (ALDACTONE) 25 MG tablet Take 0.5 tablets (12.5 mg total) by mouth daily.  15 tablet  3  . testosterone cypionate (DEPOTESTOTERONE CYPIONATE) 200 MG/ML injection Inject into the muscle every 14 (fourteen) days.        Marland Kitchen torsemide (DEMADEX) 20 MG tablet Take 1 tablet (20 mg total) by mouth daily as needed. For swelling in legs  30 tablet  6  . zolpidem (AMBIEN) 10 MG tablet Take 10 mg by mouth at bedtime  as needed. For sleep      . [DISCONTINUED] carvedilol (COREG) 12.5 MG tablet Take 1.5 tablets (18.75 mg total) by mouth 2 (two) times daily with a meal.  90 tablet  6  . azithromycin (ZITHROMAX) 250 MG tablet Take 1 tablet (250 mg total) by mouth daily. Take two once then one daily until gone  6 each  0  . beclomethasone (QVAR) 40 MCG/ACT inhaler Inhale 1 puff into the lungs 2 (two) times daily.  1 Inhaler  2  . gabapentin (NEURONTIN) 600 MG tablet Take 800 mg by mouth 4 (four) times daily.        No facility-administered encounter medications on file as of 03/16/2013.

## 2013-03-16 NOTE — Patient Instructions (Addendum)
Azithromycin 250mg  Take two once then one daily until gone Qvar 40 mcg two puff twice daily Try to reduce e cig use ,  Use nicotine replacement therapy if possible A chest xray will be obtained Return 2 months

## 2013-03-16 NOTE — Assessment & Plan Note (Signed)
There is ongoing tobacco use and the patient was given 3-10 minutes of smoking cessation counts

## 2013-03-16 NOTE — Assessment & Plan Note (Signed)
Asthmatic bronchitis with associated acute flare with well-preserved lung function on spirometry Ongoing tobacco use Plan Begin Qvar 2 puff twice daily 40 mcg strength Administer azithromycin for 5 days Smoking cessation was advised utilizing nicotine replacement therapy Followup with bilevel support at night for severe complex sleep apnea was advised

## 2013-03-16 NOTE — Assessment & Plan Note (Signed)
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

## 2013-03-17 NOTE — Progress Notes (Signed)
Quick Note:  Spoke with pt. Informed him of cxr results and recs per Dr. Wright. He verbalized understanding and voiced no further questions or concerns at this time. ______ 

## 2013-03-17 NOTE — Progress Notes (Signed)
Quick Note:  Notify the patient that the Xray is stable and no pneumonia No change in medications are recommended. Continue current meds as prescribed at last office visit ______ 

## 2013-03-20 ENCOUNTER — Other Ambulatory Visit: Payer: Self-pay | Admitting: Family Medicine

## 2013-03-20 DIAGNOSIS — R109 Unspecified abdominal pain: Secondary | ICD-10-CM

## 2013-03-26 ENCOUNTER — Other Ambulatory Visit: Payer: Self-pay | Admitting: Internal Medicine

## 2013-04-03 ENCOUNTER — Encounter: Payer: Self-pay | Admitting: Pulmonary Disease

## 2013-04-03 ENCOUNTER — Ambulatory Visit (INDEPENDENT_AMBULATORY_CARE_PROVIDER_SITE_OTHER): Payer: BC Managed Care – PPO | Admitting: Pulmonary Disease

## 2013-04-03 VITALS — BP 130/84 | HR 77 | Temp 97.6°F | Ht 72.0 in | Wt 239.4 lb

## 2013-04-03 DIAGNOSIS — G4731 Primary central sleep apnea: Secondary | ICD-10-CM

## 2013-04-03 DIAGNOSIS — G4730 Sleep apnea, unspecified: Secondary | ICD-10-CM

## 2013-04-03 NOTE — Assessment & Plan Note (Signed)
The patient is wearing his ASV device, and it appears that his central and obstructive events are well controlled according to his download.  I have encouraged him to work on increasing total sleep time, as well as aggressive weight loss.  He is to call us if he has issues with his sleep, and I will see him back in 6 months.

## 2013-04-03 NOTE — Progress Notes (Signed)
  Subjective:    Patient ID: [name], male    DOB: 15-Jun-1961, 52 y.o.   MRN: [id number]  HPI The patient comes in today for followup of his complex sleep apnea.  He has failed CPAP and BiPAP because of his central apnea component, and recently started on ASV.  The patient states he is doing much better with this device, and is having no issues with his mask or pressure.  He feels that he sleeps better with this device, and his wife has commented that he has no breakthrough snoring.  We do have it downloaded today, and this shows fairly good compliance in terms of daily usage, but he is only using about 3 hours a night on average.  I've encouraged him to work on increased total sleep time.   Review of Systems  Constitutional: Negative for fever and unexpected weight change.  HENT: Negative for ear pain, nosebleeds, congestion, sore throat, rhinorrhea, sneezing, trouble swallowing, dental problem, postnasal drip and sinus pressure.   Eyes: Negative for redness and itching.  Respiratory: Negative for cough, chest tightness, shortness of breath and wheezing.   Cardiovascular: Negative for palpitations and leg swelling.  Gastrointestinal: Negative for nausea and vomiting.  Genitourinary: Negative for dysuria.  Musculoskeletal: Negative for joint swelling.  Skin: Negative for rash.  Neurological: Negative for headaches.  Hematological: Does not bruise/bleed easily.  Psychiatric/Behavioral: Negative for dysphoric mood. The patient is not nervous/anxious.        Objective:   Physical Exam Overweight male in no acute distress Nose without purulence or discharge noted No skin breakdown or pressure necrosis from the CPAP mask Neck without lymphadenopathy or thyromegaly Lower extremities with minimal edema, no cyanosis Alert and oriented, does not appear to be sleepy, moves all 4 extremities.       Assessment & Plan:

## 2013-04-03 NOTE — Patient Instructions (Addendum)
Continue on your ASV device.   Work on weight loss followup with me in 6mos.

## 2013-04-06 ENCOUNTER — Encounter: Payer: BC Managed Care – PPO | Admitting: Physical Medicine and Rehabilitation

## 2013-04-07 ENCOUNTER — Ambulatory Visit
Admission: RE | Admit: 2013-04-07 | Discharge: 2013-04-07 | Disposition: A | Discharge status: Home / Self Care | Payer: BC Managed Care – PPO | Source: Ambulatory Visit | Attending: Family Medicine | Admitting: Family Medicine

## 2013-04-07 ENCOUNTER — Encounter: Payer: Self-pay | Admitting: Physical Medicine and Rehabilitation

## 2013-04-07 ENCOUNTER — Encounter
Payer: BC Managed Care – PPO | Attending: Physical Medicine and Rehabilitation | Admitting: Physical Medicine and Rehabilitation

## 2013-04-07 VITALS — BP 131/75 | HR 79 | Resp 16 | Ht 72.0 in | Wt 236.0 lb

## 2013-04-07 DIAGNOSIS — M79609 Pain in unspecified limb: Secondary | ICD-10-CM | POA: Insufficient documentation

## 2013-04-07 DIAGNOSIS — M48062 Spinal stenosis, lumbar region with neurogenic claudication: Secondary | ICD-10-CM | POA: Insufficient documentation

## 2013-04-07 DIAGNOSIS — Z5181 Encounter for therapeutic drug level monitoring: Secondary | ICD-10-CM

## 2013-04-07 DIAGNOSIS — Z79899 Other long term (current) drug therapy: Secondary | ICD-10-CM

## 2013-04-07 DIAGNOSIS — I509 Heart failure, unspecified: Secondary | ICD-10-CM | POA: Insufficient documentation

## 2013-04-07 DIAGNOSIS — G4730 Sleep apnea, unspecified: Secondary | ICD-10-CM | POA: Insufficient documentation

## 2013-04-07 DIAGNOSIS — R109 Unspecified abdominal pain: Secondary | ICD-10-CM

## 2013-04-07 MED ORDER — MORPHINE SULFATE ER 100 MG PO TBCR: 100.0000 mg | EXTENDED_RELEASE_TABLET | Freq: Three times a day (TID) | ORAL | Status: DC

## 2013-04-07 MED ORDER — MORPHINE SULFATE 30 MG PO TABS: 30.0000 mg | ORAL_TABLET | Freq: Four times a day (QID) | ORAL | Status: DC | PRN

## 2013-04-07 MED ORDER — GABAPENTIN 300 MG PO CAPS: ORAL_CAPSULE | ORAL | Status: DC

## 2013-04-07 NOTE — Progress Notes (Signed)
Subjective:    Patient ID: [name], male    DOB: October 26, 1960, 52 y.o.   MRN: [id number]  HPI The patient is a 52 year old male, who presents with severe spinal spondylosis and HF . The patient complains about moderate to severe pain in his lower back and LE bilateral . Taking his pain medications , and being in a flexed position alleviate the symptoms. Prolonged standing or walking aggrevates the symptoms. The patient grades his pain as a 6 /10. He has seen Dr. Noel Gerold, who suggested to place an X-stop, into his L-spine. Dr. Noel Gerold ordered a CT-myelogram, which showed severe spinal stenosis worse at L4-5. The patient also reports that he had a sleep study done and was diagnosed with severe complex sleep apnea. He is following up with his pulmonologist on this, he is now using his device and is sleeping much better and feels more refreshed.    Pain Inventory  Average Pain 8  Pain Right Now 8  My pain is burning and stabbing  In the last 24 hours, has pain interfered with the following?  General activity 7  Relation with others 7  Enjoyment of life 5  What TIME of day is your pain at its worst? morning,evening,night  Sleep (in general) NA  Pain is worse with: walking  Pain improves with: medication  Relief from Meds: 3  Mobility  use a walker  how many minutes can you walk? 10-15  ability to climb steps? yes  do you drive? yes  Do you have any goals in this area? yes  Function  retired  I need assistance with the following: feeding, dressing, meal prep, household duties and shopping  Neuro/Psych  bladder control problems  weakness  numbness  trouble walking  anxiety  Prior Studies  Any changes since last visit? no  Physicians involved in your care  Any changes since last visit? no   Family History  Problem Relation Age of Onset  . Heart disease Father 80    Died of MI   History   Social History  . Marital Status: Married    Spouse Name: N/A    Number of Children:  N/A  . Years of Education: N/A   Social History Main Topics  . Smoking status: Current Some Day Smoker -- 1.50 packs/day for 29 years    Types: Cigarettes    Last Attempt to Quit: 03/27/2013  . Smokeless tobacco: Never Used     Comment: PT STOPPED SMOKING CIGS 03/27/13. NOW CURRENTLY USING E-CIG  . Alcohol Use: No  . Drug Use: Yes    Special: Marijuana     Comment: Urine showed THC  . Sexual Activity: None   Other Topics Concern  . None   Social History Narrative  . None   Past Surgical History  Procedure Laterality Date  . Hernia repair    . Asd repair, sinus venosus    . Testicle surgery      testicular cancer surgery  . Cardiac defibrillator placement  08/2010   Past Medical History  Diagnosis Date  . Chronic systolic heart failure     EF 40-98%. s/p ST. Jude ICD  . CAD (coronary artery disease)     Last cath 2/12. 3-v CAD. Failed PCI of distal RCAc  CATH [facility] 4/13 with DES to  LAD X2  . Chronic back pain     lumbar stenosis  . Benign prostatic hypertrophy   . Depression   . History of  testicular cancer   . Narcotic dependence     chronic  . Benzodiazepine dependence     chronic  . Anxiety   . CHF (congestive heart failure)   . DJD (degenerative joint disease)   . Automatic implantable cardiac defibrillator [employer]     Analyze ST   BP 131/75  Pulse 79  Resp 16  Ht 6' (1.829 m)  Wt 236 lb (107.049 kg)  BMI 32 kg/m2  SpO2 93%     Review of Systems  Constitutional: Positive for diaphoresis and unexpected weight change.  Musculoskeletal: Positive for myalgias, arthralgias and gait problem.  Neurological: Positive for weakness and numbness.  Psychiatric/Behavioral: The patient is nervous/anxious.   All other systems reviewed and are negative.       Objective:   Physical Exam Constitutional: He is oriented to person, place, and time. He appears well-developed.  Walks with walker  HENT:  Head: Normocephalic.  Eyes: Pupils are equal, round, and  reactive to light.  Neck: Normal range of motion.  Neurological: He is alert and oriented to person, place, and time. He has normal reflexes.  Skin: Skin is warm and dry.  Psychiatric: He has a normal mood and affect.  Symmetric normal motor tone is noted throughout. Normal muscle bulk. Muscle testing reveals 5/5 muscle strength of the upper extremity, and 5/5 of the lower extremity, except right iliopsoas 4/5, and left tibialis anterior 4-/5. Full range of motion in upper and lower extremities. ROM of spine is Restricted in extension. Fine motor movements are normal in both hands.  Sensory is intact and symmetric to light touch, pinprick and proprioception.  DTR in the upper and lower extremity are present and symmetric 2+. No clonus is noted.  Patient arises from chair with slight difficulty. Narrow based gait with a walker, forward flexed spine.  Tenderness paraspinal muscles in L-spine.        Assessment & Plan:  1. Lumbar spinal stenosis with neurogenic claudication. He would benefit from decompressive surgery however the patient has coronary artery disease as well as cardiomyopathy. He therefore is no candidate for surgery at this point. He is reportedly on the heart [facility] list [facility]. He recently underwent angioplasty and stenting of 2 vessels. He plans to talk to his cardiologist at [facility] once again about his surgical risk. The earliest they could consider surgery would be in April of next year, one year after he had his medicated stents placed.  Will continue his current pain medications. Refilled his medication.  Patient is considering applying for disability in the near future.  Patient has appointment with Dr. Noel Gerold for an evaluation of his spine. Dr. Noel Gerold suggested to place an X-stop, into his L-spine. Dr. Noel Gerold ordered a CT-myelogram, which showed :  L2-3: Mild multifactorial stenosis without apparent neural  compression.  L3-4: Severe multifactorial stenosis. 2 mm of  anterolisthesis  with flexion.  L4-5: Very severe multifactorial stenosis. 6 mm of  anterolisthesis that increases to 9 mm with flexion and reduces to  3 mm with extension.  L5-S1: Poor contrast opacity because of the stenosis at L4-5.  Disc degeneration and facet degeneration. 2 mm of anterolisthesis  with flexion that reduces with extension.  2. Severe complex sleep apnea syndrome, patient has received a C or E-PAP, which helps him to sleep better, and he is more refreshed in the morning.   Refilled his morphine MS Contin 100mg  tid, # 90 and MSIR 30mg  q 6 hrs prn break through pain #  120  Patient would like to go back on his gabapentin, he was on 600mg  qid. I prescribed gabapentin 600mg  tid, but because he was not taking this medication for a while I instructed him to start slowly with 300mg  at hs, and then add 1 tablet every 3 days, until he is taking 600 mg tid.  Follow up in one month with PA.

## 2013-04-07 NOTE — Patient Instructions (Addendum)
Continue with your exercise and walking program Take one tablet of the gabapentin 300mg  , at bedtime , for three days, then add another tablet in the am for 3 days, then add another tablet at noon. Then if tolerated add another tablet at bedtime, after three days, until you are taking 600mg  at am, at noon and at bedtime.

## 2013-04-14 ENCOUNTER — Telehealth: Payer: Self-pay | Admitting: Critical Care Medicine

## 2013-04-14 MED ORDER — AZITHROMYCIN 250 MG PO TABS: ORAL_TABLET | ORAL | Status: DC

## 2013-04-14 NOTE — Telephone Encounter (Signed)
I spoke with pt. He is aware of recs. RX for ZPAK has been sent in. He stated he will call who prescribed this medication. Nothing further needed

## 2013-04-14 NOTE — Telephone Encounter (Signed)
Spoke to pt. Reports coughing with production of yellow, brown, green mucus. States that when taking in a deep breath, his chest hurts. Denies fever, chills, body aches. Requesting abx called in.  MW - please advise. Thanks!

## 2013-04-14 NOTE — Telephone Encounter (Signed)
z pak mucinex dm 1200 mg every 12 hours May need trial off acei if not improving over weekend but needs to check with whoever prescribed it to stop it

## 2013-04-18 ENCOUNTER — Encounter: Payer: Self-pay | Admitting: Internal Medicine

## 2013-04-21 ENCOUNTER — Encounter: Payer: Self-pay | Admitting: Cardiology

## 2013-05-05 ENCOUNTER — Ambulatory Visit: Payer: BC Managed Care – PPO | Admitting: Physical Medicine & Rehabilitation

## 2013-05-08 ENCOUNTER — Encounter: Payer: Self-pay | Admitting: Physical Medicine and Rehabilitation

## 2013-05-08 ENCOUNTER — Ambulatory Visit: Payer: BC Managed Care – PPO | Admitting: Physical Medicine and Rehabilitation

## 2013-05-08 ENCOUNTER — Encounter
Payer: BC Managed Care – PPO | Attending: Physical Medicine and Rehabilitation | Admitting: Physical Medicine and Rehabilitation

## 2013-05-08 VITALS — BP 163/89 | HR 88 | Resp 18 | Ht 72.0 in | Wt 234.0 lb

## 2013-05-08 DIAGNOSIS — M48062 Spinal stenosis, lumbar region with neurogenic claudication: Secondary | ICD-10-CM

## 2013-05-08 DIAGNOSIS — I428 Other cardiomyopathies: Secondary | ICD-10-CM | POA: Insufficient documentation

## 2013-05-08 DIAGNOSIS — G4730 Sleep apnea, unspecified: Secondary | ICD-10-CM | POA: Insufficient documentation

## 2013-05-08 DIAGNOSIS — Z9861 Coronary angioplasty status: Secondary | ICD-10-CM | POA: Insufficient documentation

## 2013-05-08 DIAGNOSIS — M47817 Spondylosis without myelopathy or radiculopathy, lumbosacral region: Secondary | ICD-10-CM

## 2013-05-08 DIAGNOSIS — I2510 Atherosclerotic heart disease of native coronary artery without angina pectoris: Secondary | ICD-10-CM | POA: Insufficient documentation

## 2013-05-08 MED ORDER — MORPHINE SULFATE ER 100 MG PO TBCR: 100.0000 mg | EXTENDED_RELEASE_TABLET | Freq: Three times a day (TID) | ORAL | Status: DC

## 2013-05-08 MED ORDER — MORPHINE SULFATE 30 MG PO TABS: 30.0000 mg | ORAL_TABLET | Freq: Four times a day (QID) | ORAL | Status: DC | PRN

## 2013-05-08 NOTE — Patient Instructions (Signed)
Try to stay as active as tolerated 

## 2013-05-08 NOTE — Progress Notes (Signed)
Subjective:    Patient ID: [name], male    DOB: [birth year], 52 y.o.   MRN: [id number]  HPI The patient is a 52 year old male, who presents with severe spinal spondylosis and HF . The patient complains about moderate to severe pain in his lower back and LE bilateral . Taking his pain medications , and being in a flexed position alleviate the symptoms. Prolonged standing or walking aggrevates the symptoms. The patient grades his pain as a 6 /10. He has seen Dr. Noel Gerold, who suggested to place an X-stop, into his L-spine. Dr. Noel Gerold ordered a CT-myelogram, which showed severe spinal stenosis worse at L4-5.The patient is still hesitant to do this surgery. The patient also reports that he had a sleep study done and was diagnosed with severe complex sleep apnea. He is following up with his pulmonologist on this, he is now using his device and is sleeping much better and feels more refreshed.   Pain Inventory Average Pain 8 Pain Right Now 9 My pain is sharp and burning  In the last 24 hours, has pain interfered with the following? General activity 7 Relation with others 8 Enjoyment of life 7 What TIME of day is your pain at its worst? morning, day and evening Sleep (in general) Poor  Pain is worse with: walking, standing and some activites Pain improves with: rest and medication Relief from Meds: 3  Mobility use a walker how many minutes can you walk? 3-4 do you drive?  yes  Function employed # of hrs/week 30 hours [employer] I need assistance with the following:  dressing, meal prep, household duties and shopping Do you have any goals in this area?  yes  Neuro/Psych weakness numbness trouble walking anxiety  Prior Studies Any changes since last visit?  no  Physicians involved in your care Any changes since last visit?  no   Family History  Problem Relation Age of Onset  . Heart disease Father 86    Died of MI   History   Social History  . Marital Status:  Married    Spouse Name: N/A    Number of Children: N/A  . Years of Education: N/A   Social History Main Topics  . Smoking status: Current Some Day Smoker -- 1.50 packs/day for 29 years    Types: Cigarettes    Last Attempt to Quit: 03/27/2013  . Smokeless tobacco: Never Used     Comment: PT STOPPED SMOKING CIGS 03/27/13. NOW CURRENTLY USING E-CIG  . Alcohol Use: No  . Drug Use: Yes    Special: Marijuana     Comment: Urine showed THC  . Sexual Activity: None   Other Topics Concern  . None   Social History Narrative  . None   Past Surgical History  Procedure Laterality Date  . Hernia repair    . Asd repair, sinus venosus    . Testicle surgery      testicular cancer surgery  . Cardiac defibrillator placement  08/2010   Past Medical History  Diagnosis Date  . Chronic systolic heart failure     EF 40-98%. s/p ST. Jude ICD  . CAD (coronary artery disease)     Last cath 2/12. 3-v CAD. Failed PCI of distal RCAc  CATH [facility] 4/13 with DES to  LAD X2  . Chronic back pain     lumbar stenosis  . Benign prostatic hypertrophy   . Depression   . History of testicular cancer   .  Narcotic dependence     chronic  . Benzodiazepine dependence     chronic  . Anxiety   . CHF (congestive heart failure)   . DJD (degenerative joint disease)   . Automatic implantable cardiac defibrillator [employer]     Analyze ST   BP 163/89  Pulse 88  Resp 18  Ht 6' (1.829 m)  Wt 234 lb (106.142 kg)  BMI 31.73 kg/m2  SpO2 96%     Review of Systems  Constitutional: Positive for diaphoresis and unexpected weight change.  Respiratory: Positive for apnea, cough, shortness of breath and wheezing.   Gastrointestinal: Positive for constipation.  Genitourinary: Positive for difficulty urinating.  Musculoskeletal: Positive for gait problem.  Neurological: Positive for weakness and numbness.  Psychiatric/Behavioral: The patient is nervous/anxious.   All other systems reviewed and are negative.        Objective:   Physical Exam Constitutional: He is oriented to person, place, and time. He appears well-developed.  Walks with walker  HENT:  Head: Normocephalic.  Eyes: Pupils are equal, round, and reactive to light.  Neck: Normal range of motion.  Neurological: He is alert and oriented to person, place, and time. He has normal reflexes.  Skin: Skin is warm and dry.  Psychiatric: He has a normal mood and affect.  Symmetric normal motor tone is noted throughout. Normal muscle bulk. Muscle testing reveals 5/5 muscle strength of the upper extremity, and 5/5 of the lower extremity, except right iliopsoas 4/5, and left tibialis anterior 4-/5. Full range of motion in upper and lower extremities. ROM of spine is Restricted in extension. Fine motor movements are normal in both hands.  Sensory is intact and symmetric to light touch, pinprick and proprioception.  DTR in the upper and lower extremity are present and symmetric 2+. No clonus is noted.  Patient arises from chair with slight difficulty. Narrow based gait with a walker, forward flexed spine.  Tenderness paraspinal muscles in L-spine.        Assessment & Plan:  1. Lumbar spinal stenosis with neurogenic claudication. He would benefit from decompressive surgery however the patient has coronary artery disease as well as cardiomyopathy. He therefore is no candidate for surgery at this point. He is reportedly on the heart [facility] list at [facility]. He recently underwent angioplasty and stenting of 2 vessels. He plans to talk to his cardiologist at [facility] once again about his surgical risk.   Patient is considering applying for disability in the near future.  Patient has appointment with Dr. Noel Gerold for an evaluation of his spine. Dr. Noel Gerold suggested to place an X-stop, into his L-spine. Dr. Noel Gerold ordered a CT-myelogram, which showed :  L2-3: Mild multifactorial stenosis without apparent neural  compression.  L3-4: Severe multifactorial stenosis. 2  mm of anterolisthesis  with flexion.  L4-5: Very severe multifactorial stenosis. 6 mm of  anterolisthesis that increases to 9 mm with flexion and reduces to  3 mm with extension.  L5-S1: Poor contrast opacity because of the stenosis at L4-5.  Disc degeneration and facet degeneration. 2 mm of anterolisthesis  with flexion that reduces with extension.  The patient is hesitant with this surgery at this point. 2. Severe complex sleep apnea syndrome, patient has received a C or E-PAP, which helps him to sleep better, and he is more refreshed in the morning.  Refilled his morphine MS Contin 100mg  tid, # 90 and MSIR 30mg  q 6 hrs prn break through pain #120  Continue gabapentin,  600mg  tid.  Follow up in one month with PA.

## 2013-05-18 ENCOUNTER — Other Ambulatory Visit: Payer: Self-pay

## 2013-05-30 ENCOUNTER — Ambulatory Visit (INDEPENDENT_AMBULATORY_CARE_PROVIDER_SITE_OTHER): Payer: BC Managed Care – PPO | Admitting: Critical Care Medicine

## 2013-05-30 ENCOUNTER — Encounter: Payer: Self-pay | Admitting: Critical Care Medicine

## 2013-05-30 ENCOUNTER — Other Ambulatory Visit: Payer: BC Managed Care – PPO

## 2013-05-30 VITALS — BP 122/80 | HR 59 | Temp 98.0°F | Ht 72.0 in | Wt 233.4 lb

## 2013-05-30 DIAGNOSIS — G4731 Primary central sleep apnea: Secondary | ICD-10-CM

## 2013-05-30 DIAGNOSIS — J441 Chronic obstructive pulmonary disease with (acute) exacerbation: Secondary | ICD-10-CM

## 2013-05-30 DIAGNOSIS — J209 Acute bronchitis, unspecified: Secondary | ICD-10-CM

## 2013-05-30 DIAGNOSIS — G4730 Sleep apnea, unspecified: Secondary | ICD-10-CM

## 2013-05-30 MED ORDER — AZITHROMYCIN 250 MG PO TABS: ORAL_TABLET | ORAL | Status: DC

## 2013-05-30 NOTE — Progress Notes (Signed)
Subjective:    Patient ID: [name], male    DOB: Aug 14, 1960, 52 y.o.   MRN: [id number]  HPI  52 y.o.M This patient was last seen in this clinic in 2008 and at that time was diagnosed with this obstructive sleep apnea and resolved community acquired pneumonia Over the interval time the patient's had episodes of increasing cough with brown mucus worse in the mornings. There was a question of Mycobacterium avium intracellular a in the lungs but only was a colonizer in the sputum. The patient used to smoke on a daily basis. The patient moved away from [place] only to return several years ago. The patient now has a cardiomyopathy is being followed by cardiology for this. Patient notes progressive dyspnea with exertion and cyclical cough. Note the patient is on an ACE inhibitor at this time. Ejection fraction is at 20/25 percent range. The mucus color is dark brown in nature and is worse early in the mornings.  The patient does have a complex obstructive sleep apnea and is followed by sleep medicine. The patient does have a bilevel device which she is going to be set up on the results of the sleep study.  The patient returns now for to reestablish  05/30/2013 Chief Complaint  Patient presents with  . 2 month follow up    c/o congestion and prod cough with yellowish brown mucus x 6 days, increased SOB, and wheezing with chills and sweats.  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 Started one week ago thick green mucus.   [employer].>>heated humidity Notes some nasal congestion.  Notes is more dyspneic.  No real chest pain, poss pleuritic pain .    Past Medical History  Diagnosis Date  . Chronic systolic heart failure     EF 40-98%. s/p ST. Jude ICD  . CAD (coronary artery disease)     Last cath 2/12. 3-v CAD. Failed PCI of distal RCAc  CATH [facility] 4/13 with DES to  LAD X2  . Chronic back pain     lumbar stenosis  . Benign prostatic hypertrophy   . Depression   . History of testicular cancer   . Narcotic dependence     chronic  . Benzodiazepine dependence     chronic  . Anxiety   . CHF (congestive heart failure)   . DJD (degenerative joint disease)   . Automatic implantable cardiac defibrillator [employer] Judes     Analyze ST     Family History  Problem Relation Age of Onset  . Heart disease Father 34    Died of MI     History   Social History  . Marital Status: Married    Spouse Name: N/A    Number of Children: N/A  . Years of Education: N/A   Occupational History  . Not on file.   Social History Main Topics  . Smoking status: Current  Some Day Smoker -- 1.50 packs/day for 29 years    Types: Cigarettes    Last Attempt to Quit: 03/27/2013  . Smokeless tobacco: Never Used     Comment: PT STOPPED SMOKING CIGS 03/27/13. NOW CURRENTLY USING E-CIG  . Alcohol Use: No  . Drug Use: Yes    Special: Marijuana     Comment: Urine showed THC  . Sexual Activity: Not on file   Other Topics Concern  . Not on file   Social History Narrative  . No narrative on file     Allergies  Allergen Reactions  . Darvocet [Propoxyphene-Acetaminophen] Anaphylaxis    Throat closes     Outpatient Prescriptions Prior to Visit  Medication Sig Dispense Refill  . alfuzosin (UROXATRAL) 10 MG 24 hr  tablet as needed.       Marland Kitchen aspirin 325 MG tablet Take 325 mg by mouth 2 (two) times daily.       . beclomethasone (QVAR) 40 MCG/ACT inhaler Inhale 1 puff into the lungs 2 (two) times daily.  1 Inhaler  2  . carvedilol (COREG) 12.5 MG tablet Take 12.5 mg by mouth 2 (two) times daily with a meal.      . clopidogrel (PLAVIX) 75 MG tablet Take 1 tablet (75 mg total) by mouth daily.  30 tablet  6  . CRESTOR 40 MG tablet TAKE 1 TABLET DAILY.  30 tablet  12  . diazepam (VALIUM) 10 MG tablet Take 10 mg by mouth 3 (three) times daily. For muscle spasms      . ezetimibe (ZETIA) 10 MG tablet Take 1 tablet (10 mg total) by mouth daily.  30 tablet  6  . gabapentin (NEURONTIN) 300 MG capsule Take 2 tablets three times a day, Start as instructed by your provider  180 capsule  0  . lactulose (CHRONULAC) 10 GM/15ML solution Take 20 g by mouth daily as needed. For constipation      . lisinopril (PRINIVIL,ZESTRIL) 10 MG tablet Take 10 mg by mouth 2 (two) times daily.       Marland Kitchen LORazepam (ATIVAN) 1 MG tablet Take 1 mg by mouth 4 (four) times daily. For anxiety      . morphine (MS CONTIN) 100 MG 12 hr tablet Take 1 tablet (100 mg total) by mouth 3 (three) times daily.  90 tablet  0  . morphine (MSIR) 30 MG tablet Take 1 tablet (30 mg total) by mouth every 6 (six) hours as needed. For pain  120 tablet  0  . sildenafil (VIAGRA) 100 MG tablet Take 1 tablet (100 mg total) by mouth as needed for erectile dysfunction.  10 tablet  6  . spironolactone (ALDACTONE) 25 MG tablet Take 0.5 tablets (12.5 mg total) by mouth daily.  15 tablet  3  . testosterone cypionate (DEPOTESTOTERONE CYPIONATE) 200 MG/ML injection Inject into the muscle every 14 (fourteen) days.        Marland Kitchen torsemide (DEMADEX) 20 MG tablet Take 1 tablet (20 mg total) by mouth daily as needed. For swelling in legs  30 tablet  6  . traZODone (DESYREL) 100 MG tablet Take 100 mg by mouth at bedtime.      Marland Kitchen zolpidem (AMBIEN) 10 MG tablet Take 10 mg by mouth at bedtime as  needed. For sleep      . azithromycin (ZITHROMAX) 250 MG tablet Take as directed  6 tablet  0  . nystatin (MYCOSTATIN) 100000 UNIT/ML suspension Take 500,000 Units by mouth. Use every 5 mins for  no more than 15 mins for chest pain PRN       No facility-administered medications prior to visit.      Review of Systems  Constitutional:   No  weight loss, night sweats,  Fevers, chills, fatigue, lassitude. HEENT:   No headaches,  Difficulty swallowing,  Tooth/dental problems,  Sore throat,                No sneezing, itching, ear ache, nasal congestion, post nasal drip,   CV:  No chest pain,  Orthopnea, PND, swelling in lower extremities, anasarca, dizziness, palpitations  GI  No heartburn, indigestion, abdominal pain, nausea, vomiting, diarrhea, change in bowel habits, loss of appetite  Resp: Notes shortness of breath with exertion and at rest.  No excess mucus, notes productive cough,  Notes non-productive cough,  No coughing up of blood.  No change in color of mucus.  No wheezing.  No chest wall deformity  Skin: no rash or lesions.  GU: no dysuria, change in color of urine, no urgency or frequency.  No flank pain.  MS:  No joint pain or swelling.  No decreased range of motion.  No back pain.  Psych:  No change in mood or affect. No depression or anxiety.  No memory loss.     Objective:   Physical Exam  Filed Vitals:   05/30/13 1141  BP: 122/80  Pulse: 59  Temp: 98 F (36.7 C)  TempSrc: Oral  Height: 6' (1.829 m)  Weight: 233 lb 6.4 oz (105.87 kg)  SpO2: 97%    Gen: Pleasant, well-nourished, in no distress,  normal affect  ENT: No lesions,  mouth clear,  oropharynx clear, no postnasal drip  Neck: No JVD, no TMG, no carotid bruits  Lungs: No use of accessory muscles, no dullness to percussion, scattered rhonchi  Cardiovascular: RRR, heart sounds normal, no murmur or gallops, no peripheral edema  Abdomen: soft and NT, no HSM,  BS normal  Musculoskeletal: No  deformities, no cyanosis or clubbing  Neuro: alert, non focal  Skin: Warm, no lesions or rashes  No results found. Spirometry on 03/16/2013 shows normal spirometry with FEV1 and FVC greater than 100% per day     Assessment & Plan:   Obstructive chronic bronchitis with exacerbation Chronic obstructive lung disease with recent exacerbation now with mild recurrent exacerbation of COPD Ongoing tobacco use Plan Azithromycin 250mg  Take two once then one daily until gone Sputum culture No other changes Return one week for flu shot     Updated Medication List Outpatient Encounter Prescriptions as of 05/30/2013  Medication Sig  . alfuzosin (UROXATRAL) 10 MG 24 hr tablet as needed.   Marland Kitchen aspirin 325 MG tablet Take 325 mg by mouth 2 (two) times daily.   . beclomethasone (QVAR) 40 MCG/ACT inhaler Inhale 1 puff into the lungs 2 (two) times daily.  . carvedilol (COREG) 12.5 MG tablet Take 12.5 mg by mouth 2 (two) times daily with a meal.  . clopidogrel (PLAVIX) 75 MG tablet Take 1 tablet (75 mg total) by mouth daily.  . CRESTOR 40 MG tablet TAKE 1 TABLET DAILY.  . diazepam (VALIUM) 10 MG tablet Take 10 mg by mouth 3 (three) times daily. For muscle spasms  . ezetimibe (ZETIA) 10 MG tablet Take 1 tablet (10 mg total) by mouth daily.  Marland Kitchen gabapentin (NEURONTIN) 300 MG capsule Take 2 tablets three times a day, Start as instructed by your provider  . lactulose (CHRONULAC) 10 GM/15ML solution Take 20 g  by mouth daily as needed. For constipation  . lisinopril (PRINIVIL,ZESTRIL) 10 MG tablet Take 10 mg by mouth 2 (two) times daily.   Marland Kitchen LORazepam (ATIVAN) 1 MG tablet Take 1 mg by mouth 4 (four) times daily. For anxiety  . morphine (MS CONTIN) 100 MG 12 hr tablet Take 1 tablet (100 mg total) by mouth 3 (three) times daily.  Marland Kitchen morphine (MSIR) 30 MG tablet Take 1 tablet (30 mg total) by mouth every 6 (six) hours as needed. For pain  . nitroGLYCERIN (NITROSTAT) 0.4 MG SL tablet Place 0.4 mg under the  tongue every 5 (five) minutes as needed for chest pain.  . sildenafil (VIAGRA) 100 MG tablet Take 1 tablet (100 mg total) by mouth as needed for erectile dysfunction.  Marland Kitchen spironolactone (ALDACTONE) 25 MG tablet Take 0.5 tablets (12.5 mg total) by mouth daily.  Marland Kitchen testosterone cypionate (DEPOTESTOTERONE CYPIONATE) 200 MG/ML injection Inject into the muscle every 14 (fourteen) days.    Marland Kitchen torsemide (DEMADEX) 20 MG tablet Take 1 tablet (20 mg total) by mouth daily as needed. For swelling in legs  . traZODone (DESYREL) 100 MG tablet Take 100 mg by mouth at bedtime.  Marland Kitchen zolpidem (AMBIEN) 10 MG tablet Take 10 mg by mouth at bedtime as needed. For sleep  . azithromycin (ZITHROMAX) 250 MG tablet Take as directed  . [DISCONTINUED] azithromycin (ZITHROMAX) 250 MG tablet Take as directed  . [DISCONTINUED] nystatin (MYCOSTATIN) 100000 UNIT/ML suspension Take 500,000 Units by mouth. Use every 5 mins for no more than 15 mins for chest pain PRN

## 2013-05-30 NOTE — Patient Instructions (Signed)
Azithromycin 250mg  Take two once then one daily until gone Sputum culture No other changes Return one week for flu shot

## 2013-05-31 NOTE — Assessment & Plan Note (Signed)
Chronic obstructive lung disease with recent exacerbation now with mild recurrent exacerbation of COPD Ongoing tobacco use Plan Azithromycin 247m Take two once then one daily until gone Sputum culture No other changes Return one week for flu shot

## 2013-06-02 LAB — RESPIRATORY CULTURE OR RESPIRATORY AND SPUTUM CULTURE

## 2013-06-05 ENCOUNTER — Telehealth: Payer: Self-pay | Admitting: Critical Care Medicine

## 2013-06-05 ENCOUNTER — Encounter: Payer: Self-pay | Admitting: Internal Medicine

## 2013-06-05 ENCOUNTER — Encounter: Payer: BC Managed Care – PPO | Admitting: *Deleted

## 2013-06-05 ENCOUNTER — Other Ambulatory Visit: Payer: Self-pay | Admitting: Internal Medicine

## 2013-06-05 NOTE — Progress Notes (Signed)
Quick Note:  lmomtcb for pt on home and cell #s ______ 

## 2013-06-05 NOTE — Telephone Encounter (Signed)
Notes Recorded by Elsie Stain, MD on 06/02/2013 at 4:14 PM Cal the pt and tell him sputum cult pos for H. Flu (not flu bug, a bacteria) Take ABX as Rx at last OV   Spoke with pt and is aware of recs. He reports he has already finished ABX. He reports he is feeling some better but no 100%. He is scheduled to come in for the flu shot tomorrow in [place] and wants to know if he still should get this done? Please advise Dr. Joya Gaskins thanks

## 2013-06-05 NOTE — Telephone Encounter (Signed)
Pt is aware. Nothing further needed

## 2013-06-05 NOTE — Telephone Encounter (Signed)
Yes get flu vaccine

## 2013-06-05 NOTE — Telephone Encounter (Signed)
lmomtcb x1 for pt 

## 2013-06-06 ENCOUNTER — Encounter
Payer: BC Managed Care – PPO | Attending: Physical Medicine and Rehabilitation | Admitting: Physical Medicine and Rehabilitation

## 2013-06-06 ENCOUNTER — Ambulatory Visit: Payer: BC Managed Care – PPO

## 2013-06-06 ENCOUNTER — Encounter: Payer: Self-pay | Admitting: Physical Medicine and Rehabilitation

## 2013-06-06 VITALS — BP 167/86 | HR 114 | Resp 16 | Ht 72.0 in | Wt 228.0 lb

## 2013-06-06 DIAGNOSIS — I428 Other cardiomyopathies: Secondary | ICD-10-CM | POA: Insufficient documentation

## 2013-06-06 DIAGNOSIS — M25569 Pain in unspecified knee: Secondary | ICD-10-CM

## 2013-06-06 DIAGNOSIS — M51379 Other intervertebral disc degeneration, lumbosacral region without mention of lumbar back pain or lower extremity pain: Secondary | ICD-10-CM | POA: Insufficient documentation

## 2013-06-06 DIAGNOSIS — M5137 Other intervertebral disc degeneration, lumbosacral region: Secondary | ICD-10-CM | POA: Insufficient documentation

## 2013-06-06 DIAGNOSIS — M25562 Pain in left knee: Secondary | ICD-10-CM

## 2013-06-06 DIAGNOSIS — M48062 Spinal stenosis, lumbar region with neurogenic claudication: Secondary | ICD-10-CM | POA: Insufficient documentation

## 2013-06-06 DIAGNOSIS — G4733 Obstructive sleep apnea (adult) (pediatric): Secondary | ICD-10-CM | POA: Insufficient documentation

## 2013-06-06 DIAGNOSIS — I2510 Atherosclerotic heart disease of native coronary artery without angina pectoris: Secondary | ICD-10-CM | POA: Insufficient documentation

## 2013-06-06 DIAGNOSIS — Z79899 Other long term (current) drug therapy: Secondary | ICD-10-CM | POA: Insufficient documentation

## 2013-06-06 DIAGNOSIS — M25469 Effusion, unspecified knee: Secondary | ICD-10-CM | POA: Insufficient documentation

## 2013-06-06 DIAGNOSIS — I509 Heart failure, unspecified: Secondary | ICD-10-CM | POA: Insufficient documentation

## 2013-06-06 MED ORDER — MORPHINE SULFATE 30 MG PO TABS: 30.0000 mg | ORAL_TABLET | Freq: Four times a day (QID) | ORAL | Status: DC | PRN

## 2013-06-06 MED ORDER — MORPHINE SULFATE ER 100 MG PO TBCR: 100.0000 mg | EXTENDED_RELEASE_TABLET | Freq: Three times a day (TID) | ORAL | Status: DC

## 2013-06-06 NOTE — Progress Notes (Signed)
Subjective:    Patient ID: [name], male    DOB: 08-18-1960, 52 y.o.   MRN: [id number]  HPI The patient is a 52 year old male, who presents with severe spinal spondylosis and HF . The patient complains about moderate to severe pain in his lower back and LE bilateral . Taking his pain medications , and being in a flexed position alleviate the symptoms. Prolonged standing or walking aggrevates the symptoms. The patient grades his pain as a 6 /10. He has seen Dr. Noel Gerold, who suggested to place an X-stop, into his L-spine. Dr. Noel Gerold ordered a CT-myelogram, which showed severe spinal stenosis worse at L4-5.The patient is still hesitant to do this surgery. The patient also reports that he had a sleep study done and was diagnosed with severe complex sleep apnea. He is following up with his pulmonologist on this, he is now using his device and is sleeping much better and feels more refreshed.  He reports that he fell last Friday on his left knee, he states that it is swollen, but he can put his full weight on without very much pain.  Pain Inventory Average Pain 8 Pain Right Now 9 My pain is sharp, burning and stabbing  In the last 24 hours, has pain interfered with the following? General activity 6 Relation with others 8 Enjoyment of life 7 What TIME of day is your pain at its worst? daytime, evening Sleep (in general) Poor  Pain is worse with: walking, standing and some activites Pain improves with: medication Relief from Meds: 7  Mobility walk with assistance use a walker ability to climb steps?  no do you drive?  yes  Function employed # of hrs/week 25 I need assistance with the following:  dressing, household duties and shopping  Neuro/Psych numbness tingling trouble walking anxiety  Prior Studies Any changes since last visit?  no  Physicians involved in your care Any changes since last visit?  no   Family History  Problem Relation Age of Onset  . Heart disease  Father 30    Died of MI   History   Social History  . Marital Status: Married    Spouse Name: N/A    Number of Children: N/A  . Years of Education: N/A   Social History Main Topics  . Smoking status: Current Some Day Smoker -- 1.50 packs/day for 29 years    Types: Cigarettes    Last Attempt to Quit: 03/27/2013  . Smokeless tobacco: Never Used     Comment: PT STOPPED SMOKING CIGS 03/27/13. NOW CURRENTLY USING E-CIG  . Alcohol Use: No  . Drug Use: Yes    Special: Marijuana     Comment: Urine showed THC  . Sexual Activity: None   Other Topics Concern  . None   Social History Narrative  . None   Past Surgical History  Procedure Laterality Date  . Hernia repair    . Asd repair, sinus venosus    . Testicle surgery      testicular cancer surgery  . Cardiac defibrillator placement  08/2010   Past Medical History  Diagnosis Date  . Chronic systolic heart failure     EF 65-78%. s/p ST. Jude ICD  . CAD (coronary artery disease)     Last cath 2/12. 3-v CAD. Failed PCI of distal RCAc  C[facility] 4/13 with DES to  LAD X2  . Chronic back pain     lumbar stenosis  . Benign prostatic hypertrophy   .  Depression   . History of testicular cancer   . Narcotic dependence     chronic  . Benzodiazepine dependence     chronic  . Anxiety   . CHF (congestive heart failure)   . DJD (degenerative joint disease)   . Automatic implantable cardiac defibrillator [employer]     Analyze ST   BP 167/86  Pulse 114  Resp 16  Ht 6' (1.829 m)  Wt 228 lb (103.42 kg)  BMI 30.92 kg/m2  SpO2 92%     Review of Systems  Constitutional: Positive for unexpected weight change.  Respiratory: Positive for apnea.   Cardiovascular: Positive for leg swelling.  Genitourinary: Positive for decreased urine volume.  Musculoskeletal: Positive for back pain and gait problem.  Neurological: Positive for numbness.       Tingling  Psychiatric/Behavioral: The patient is nervous/anxious.   All other systems  reviewed and are negative.       Objective:   Physical Exam Constitutional: He is oriented to person, place, and time. He appears well-developed.  Walks with walker  HENT:  Head: Normocephalic.  Eyes: Pupils are equal, round, and reactive to light.  Neck: Normal range of motion.  Neurological: He is alert and oriented to person, place, and time. He has normal reflexes.  Skin: Skin is warm and dry.  Psychiatric: He has a normal mood and affect.  Symmetric normal motor tone is noted throughout. Normal muscle bulk. Muscle testing reveals 5/5 muscle strength of the upper extremity, and 5/5 of the lower extremity, except right iliopsoas 4/5, and left tibialis anterior 4-/5. Full range of motion in upper and lower extremities. ROM of spine is Restricted in extension. Fine motor movements are normal in both hands.  Sensory is intact and symmetric to light touch, pinprick and proprioception.  DTR in the upper and lower extremity are present and symmetric 2+. No clonus is noted.  Patient arises from chair with slight difficulty. Narrow based gait with a walker, forward flexed spine.  Tenderness paraspinal muscles in L-spine. Left knee : effusion in left knee joint, swelling on top of patella, but full ROM, and only very mild pain, with standing on his left leg only, pain with palpation of patella        Assessment & Plan:  1. Lumbar spinal stenosis with neurogenic claudication. He would benefit from decompressive surgery however the patient has coronary artery disease as well as cardiomyopathy. He therefore is no candidate for surgery at this point. He is reportedly on the heart transplant list at [facility]. He recently underwent angioplasty and stenting of 2 vessels. He plans to talk to his cardiologist at [facility] once again about his surgical risk.  Patient is considering applying for disability in the near future.  Patient has appointment with Dr. Noel Gerold for an evaluation of his spine. Dr. Noel Gerold  suggested to place an X-stop, into his L-spine. Dr. Noel Gerold ordered a CT-myelogram, which showed :  L2-3: Mild multifactorial stenosis without apparent neural  compression.  L3-4: Severe multifactorial stenosis. 2 mm of anterolisthesis  with flexion.  L4-5: Very severe multifactorial stenosis. 6 mm of  anterolisthesis that increases to 9 mm with flexion and reduces to  3 mm with extension.  L5-S1: Poor contrast opacity because of the stenosis at L4-5.  Disc degeneration and facet degeneration. 2 mm of anterolisthesis  with flexion that reduces with extension.  The patient is hesitant with this surgery at this point.  2. Severe complex sleep apnea syndrome, patient has  received a C or E-PAP, which helps him to sleep better, and he is more refreshed in the morning.  3. He reports that he fell last Friday on his left knee, he states that it is swollen, but he can put his full weight on without very much pain.Ordered x-ray of his left knee, consider referral to orthopedic surgeon depending on results, and Sx Refilled his morphine MS Contin 100mg  tid, # 90 and MSIR 30mg  q 6 hrs prn break through pain #120  Continue gabapentin, 600mg  tid.  Follow up in one month with PA.

## 2013-06-06 NOTE — Patient Instructions (Signed)
Follow up with your orthopedic surgeon, if your knee pain does not improve, or if the X-rays I ordered show some findings,

## 2013-06-07 ENCOUNTER — Telehealth: Payer: Self-pay

## 2013-06-07 NOTE — Telephone Encounter (Signed)
Ok, to fill

## 2013-06-07 NOTE — Telephone Encounter (Signed)
Patient request voltaren gel, sent to [facility]

## 2013-06-12 ENCOUNTER — Encounter: Payer: Self-pay | Admitting: *Deleted

## 2013-06-12 MED ORDER — DICLOFENAC SODIUM 1 % TD GEL: 2.0000 g | Freq: Four times a day (QID) | TRANSDERMAL | Status: DC

## 2013-06-12 NOTE — Telephone Encounter (Signed)
Please give directions for voltaren gel.

## 2013-06-12 NOTE — Telephone Encounter (Signed)
Voltaren gel, 2g, qid, 2 tubes, with 2 refills

## 2013-06-12 NOTE — Telephone Encounter (Signed)
Order placed

## 2013-06-13 ENCOUNTER — Telehealth (HOSPITAL_COMMUNITY): Payer: Self-pay | Admitting: Cardiology

## 2013-06-13 NOTE — Telephone Encounter (Signed)
Will send to Dr Gala Romney

## 2013-06-13 NOTE — Telephone Encounter (Signed)
Pt called to request a letter/ statement to given to his oral surgeon stating his procedure can be done at a routine oral surgery facility/ day surgery ctr VS the hospital surgery center. Pt is requesting to have it done at a oral surgeon office because of cost and surgeon wants to do at the hospital because of his cardiac history. Please advise from a cardiac stand point either way Pt is scheduled to have 4 extractions and a bridge placed, Dr. Felton Clinton

## 2013-06-18 LAB — MDC_IDC_ENUM_SESS_TYPE_REMOTE
Battery Remaining Longevity: 64 mo
Brady Statistic RA Percent Paced: 1.3 %
Brady Statistic RV Percent Paced: 1 %
Implantable Pulse Generator Serial Number: 815096
Lead Channel Impedance Value: 360 Ohm
Lead Channel Impedance Value: 510 Ohm
Lead Channel Pacing Threshold Amplitude: 0.5 V
Lead Channel Pacing Threshold Amplitude: 2 V
Lead Channel Pacing Threshold Pulse Width: 0.5 ms
Lead Channel Pacing Threshold Pulse Width: 0.5 ms
Lead Channel Sensing Intrinsic Amplitude: 12 mV
Lead Channel Sensing Intrinsic Amplitude: 5 mV
Lead Channel Setting Pacing Amplitude: 2 V
Lead Channel Setting Pacing Amplitude: 3 V
Lead Channel Setting Pacing Pulse Width: 0.5 ms
Lead Channel Setting Sensing Sensitivity: 0.5 mV
Zone Setting Detection Interval: 270 ms
Zone Setting Detection Interval: 340 ms
Zone Setting Detection Interval: 400 ms

## 2013-06-20 NOTE — Telephone Encounter (Signed)
Dr Gala Romney completed form that was faxed over "pt stable from cardiac perspective for oral surgery would favor conscious sedation over general anesthesia, if possible." Dr Gala Romney also called and spoke w/Dr Hyacinth Meeker regarding this matter and states he feels pt would be ok to have procedure done in surgery center but he will leave that up to Dr Rondel Baton preference

## 2013-06-29 ENCOUNTER — Encounter: Payer: Self-pay | Admitting: Physical Medicine and Rehabilitation

## 2013-06-29 ENCOUNTER — Encounter
Payer: BC Managed Care – PPO | Attending: Physical Medicine and Rehabilitation | Admitting: Physical Medicine and Rehabilitation

## 2013-06-29 VITALS — BP 136/86 | HR 83 | Resp 14 | Ht 72.0 in | Wt 226.0 lb

## 2013-06-29 DIAGNOSIS — M48062 Spinal stenosis, lumbar region with neurogenic claudication: Secondary | ICD-10-CM

## 2013-06-29 DIAGNOSIS — I2510 Atherosclerotic heart disease of native coronary artery without angina pectoris: Secondary | ICD-10-CM | POA: Insufficient documentation

## 2013-06-29 DIAGNOSIS — I509 Heart failure, unspecified: Secondary | ICD-10-CM | POA: Insufficient documentation

## 2013-06-29 DIAGNOSIS — F3289 Other specified depressive episodes: Secondary | ICD-10-CM | POA: Insufficient documentation

## 2013-06-29 DIAGNOSIS — I5022 Chronic systolic (congestive) heart failure: Secondary | ICD-10-CM | POA: Insufficient documentation

## 2013-06-29 DIAGNOSIS — F329 Major depressive disorder, single episode, unspecified: Secondary | ICD-10-CM | POA: Insufficient documentation

## 2013-06-29 DIAGNOSIS — G4737 Central sleep apnea in conditions classified elsewhere: Secondary | ICD-10-CM | POA: Insufficient documentation

## 2013-06-29 DIAGNOSIS — Z95810 Presence of automatic (implantable) cardiac defibrillator: Secondary | ICD-10-CM | POA: Insufficient documentation

## 2013-06-29 DIAGNOSIS — Z9181 History of falling: Secondary | ICD-10-CM | POA: Insufficient documentation

## 2013-06-29 DIAGNOSIS — G8929 Other chronic pain: Secondary | ICD-10-CM | POA: Insufficient documentation

## 2013-06-29 DIAGNOSIS — G4733 Obstructive sleep apnea (adult) (pediatric): Secondary | ICD-10-CM | POA: Insufficient documentation

## 2013-06-29 DIAGNOSIS — R269 Unspecified abnormalities of gait and mobility: Secondary | ICD-10-CM

## 2013-06-29 DIAGNOSIS — Z79899 Other long term (current) drug therapy: Secondary | ICD-10-CM

## 2013-06-29 MED ORDER — MORPHINE SULFATE ER 100 MG PO TBCR: 100.0000 mg | EXTENDED_RELEASE_TABLET | Freq: Three times a day (TID) | ORAL | Status: DC

## 2013-06-29 MED ORDER — GABAPENTIN 300 MG PO CAPS: ORAL_CAPSULE | ORAL | Status: DC

## 2013-06-29 MED ORDER — MORPHINE SULFATE 30 MG PO TABS: 30.0000 mg | ORAL_TABLET | Freq: Four times a day (QID) | ORAL | Status: DC | PRN

## 2013-06-29 NOTE — Progress Notes (Signed)
Subjective:    Patient ID: [name], male    DOB: [birth year], 52 y.o.   MRN: [id number]  HPI [name] is a 52 year old male with history of severe spinal spondylosis and chronic HF who returns for followup on pain management and medication refill. He complains about moderate to severe pain in his lower back  radiating to BLE . Taking his pain medications and being in a flexed position alleviate the symptoms. Prolonged standing or walking aggrevates the symptoms. He has had falls without warning and uses rollater at all times. He has lost some weight and this has helped decrease his symptoms. [facility] finally has voltaren gel and he looking forward to see if this will help his symptoms.   He also reports numbness and tingling LUE at times when awakening from sleep--he was told to use nitro for this but questions it's validity as it's not very effective.  Pain Inventory Average Pain 8 Pain Right Now 7 My pain is sharp, burning, stabbing and aching  In the last 24 hours, has pain interfered with the following? General activity 7 Relation with others 7 Enjoyment of life 6 What TIME of day is your pain at its worst? morning, day, evening Sleep (in general) Fair  Pain is worse with: walking and bending Pain improves with: rest and medication Relief from Meds: 6  Mobility walk with assistance use a walker ability to climb steps?  yes do you drive?  yes Do you have any goals in this area?  no  Function employed # of hrs/week 30-40 I need assistance with the following:  dressing, household duties and shopping Do you have any goals in this area?  no  Neuro/Psych numbness trouble walking anxiety  Prior Studies Any changes since last visit?  no  Physicians involved in your care Any changes since last visit?  no   Family History  Problem Relation Age of Onset  . Heart disease Father 50    Died of MI   History   Social History  . Marital Status: Married   Spouse Name: N/A    Number of Children: N/A  . Years of Education: N/A   Social History Main Topics  . Smoking status: Current Some Day Smoker -- 1.50 packs/day for 29 years    Types: Cigarettes    Last Attempt to Quit: 03/27/2013  . Smokeless tobacco: Never Used     Comment: PT STOPPED SMOKING CIGS 03/27/13. NOW CURRENTLY USING E-CIG  . Alcohol Use: No  . Drug Use: Yes    Special: Marijuana     Comment: Urine showed THC  . Sexual Activity: None   Other Topics Concern  . None   Social History Narrative  . None   Past Surgical History  Procedure Laterality Date  . Hernia repair    . Asd repair, sinus venosus    . Testicle surgery      testicular cancer surgery  . Cardiac defibrillator placement  08/2010   Past Medical History  Diagnosis Date  . Chronic systolic heart failure     EF 40-98%. s/p ST. Jude ICD  . CAD (coronary artery disease)     Last cath 2/12. 3-v CAD. Failed PCI of distal RCAc  CATH [facility] 4/13 with DES to  LAD X2  . Chronic back pain     lumbar stenosis  . Benign prostatic hypertrophy   . Depression   . History of testicular cancer   . Narcotic  dependence     chronic  . Benzodiazepine dependence     chronic  . Anxiety   . CHF (congestive heart failure)   . DJD (degenerative joint disease)   . Automatic implantable cardiac defibrillator [employer]     Analyze ST   BP 136/86  Pulse 83  Resp 14  Ht 6' (1.829 m)  Wt 226 lb (102.513 kg)  BMI 30.64 kg/m2  SpO2 92%     Review of Systems  Constitutional: Positive for unexpected weight change.  Respiratory: Positive for apnea.   Genitourinary: Positive for decreased urine volume.  Musculoskeletal: Positive for back pain and gait problem.  Neurological: Positive for numbness.  Psychiatric/Behavioral: The patient is nervous/anxious.   All other systems reviewed and are negative.       Objective:   Physical Exam  Constitutional: He appears well-developed and well-nourished.  HENT:  Head:  Normocephalic and atraumatic.  Eyes: Conjunctivae are normal. Pupils are equal, round, and reactive to light.  Cardiovascular: Normal rate and regular rhythm.   Pulmonary/Chest: Effort normal. He has decreased breath sounds. He has no wheezes.  Musculoskeletal: He exhibits no edema.  Symmetric normal motor tone is noted throughout. Normal muscle bulk. Muscle testing reveals 5/5 muscle strength of the upper extremity, and 5/5 of the lower extremity, except right iliopsoas 4/5, and left tibialis anterior 4-/5. Full range of motion in upper and lower extremities. ROM of spine is Restricted in extension. Sensory is intact to light touch.  DTR's present, symmetric 2+. No clonus is noted.   Patient arises from chair with slight difficulty and noted to have narrow based gait with forward flexed spine.     Neurological: He is alert.  Skin: Skin is warm and dry.          Assessment & Plan:  1.  Lumbar spinal stenosis with neurogenic claudication: He has decided against X- stop by Dr. Noel Gerold but does not know anyone else in area who does this surgery. He reports recent falls due to neurogenic claudication. He is not a surgical candidate--was cleared by cardiologist a couple of years ago but denied by anesthesia. He has seen NS at [facility] in the past.  Current pain medication regimen keep symptoms at manageable level where he can work and get some pleasure out of life.  Recommended getting another opinion at [facility] to see if they have something similar to offer.  Refilled: Gabapentin 300 mg #180--use two tabs tid.      MS contin 100 mg #90-- use tid      MSIR  30 mg # 120 use one every 6 hours prn.   2. Severe complex sleep apnea:  Sleep hygiene has improved  but he is having problems with the machine (too much humidification causing chocking episode) and plans on going to [place] office to have them change out equipment.  3. Neuropathy LUE:  Reviewed CTA neck from 2012 that indicated cervical spine  disease. Offered working it up with CT. We discussed what it may show but again he would not have surgical options and feels that it would be a waste of resources (with current life expectancy)   4. Severe CAD/depression:  Discussed QOL--the fact that he's on heart transplant list, his life expectancy, wife 8 years younger and 69 year old son at home as well as pro/cons of surgery given his life expectency. He is currently working and that helps gives him a goal.

## 2013-07-11 ENCOUNTER — Encounter: Payer: Self-pay | Admitting: *Deleted

## 2013-07-12 LAB — AFB CULTURE WITH SMEAR (NOT AT ARMC): Acid Fast Smear: NONE SEEN

## 2013-07-20 ENCOUNTER — Ambulatory Visit (HOSPITAL_COMMUNITY): Admission: RE | Admit: 2013-07-20 | Payer: BC Managed Care – PPO | Source: Ambulatory Visit | Admitting: Oral Surgery

## 2013-07-20 ENCOUNTER — Encounter (HOSPITAL_COMMUNITY): Admission: RE | Payer: Self-pay | Source: Ambulatory Visit

## 2013-07-20 SURGERY — MULTIPLE EXTRACTION WITH ALVEOLOPLASTY
Anesthesia: General | Site: Mouth

## 2013-07-27 ENCOUNTER — Encounter: Payer: Self-pay | Admitting: Physical Medicine and Rehabilitation

## 2013-07-27 ENCOUNTER — Encounter
Payer: BC Managed Care – PPO | Attending: Physical Medicine and Rehabilitation | Admitting: Physical Medicine and Rehabilitation

## 2013-07-27 VITALS — BP 129/69 | HR 83 | Resp 14 | Ht 72.0 in | Wt 227.0 lb

## 2013-07-27 DIAGNOSIS — T3995XA Adverse effect of unspecified nonopioid analgesic, antipyretic and antirheumatic, initial encounter: Secondary | ICD-10-CM

## 2013-07-27 DIAGNOSIS — K5900 Constipation, unspecified: Secondary | ICD-10-CM

## 2013-07-27 DIAGNOSIS — M545 Low back pain, unspecified: Secondary | ICD-10-CM

## 2013-07-27 DIAGNOSIS — G4733 Obstructive sleep apnea (adult) (pediatric): Secondary | ICD-10-CM | POA: Insufficient documentation

## 2013-07-27 DIAGNOSIS — K5909 Other constipation: Secondary | ICD-10-CM | POA: Insufficient documentation

## 2013-07-27 DIAGNOSIS — M48062 Spinal stenosis, lumbar region with neurogenic claudication: Secondary | ICD-10-CM

## 2013-07-27 DIAGNOSIS — M79605 Pain in left leg: Secondary | ICD-10-CM

## 2013-07-27 DIAGNOSIS — M79604 Pain in right leg: Secondary | ICD-10-CM

## 2013-07-27 DIAGNOSIS — R269 Unspecified abnormalities of gait and mobility: Secondary | ICD-10-CM

## 2013-07-27 DIAGNOSIS — Z79899 Other long term (current) drug therapy: Secondary | ICD-10-CM | POA: Insufficient documentation

## 2013-07-27 DIAGNOSIS — T398X5A Adverse effect of other nonopioid analgesics and antipyretics, not elsewhere classified, initial encounter: Secondary | ICD-10-CM | POA: Insufficient documentation

## 2013-07-27 MED ORDER — MORPHINE SULFATE ER 100 MG PO TBCR: 100.0000 mg | EXTENDED_RELEASE_TABLET | Freq: Three times a day (TID) | ORAL | Status: DC

## 2013-07-27 MED ORDER — MORPHINE SULFATE 30 MG PO TABS: 30.0000 mg | ORAL_TABLET | Freq: Four times a day (QID) | ORAL | Status: DC | PRN

## 2013-07-27 NOTE — Progress Notes (Signed)
Subjective: follow up on lumbar spinal stenosis with neurogenic claudication, gait disorder nad mdiecalin     Patient ID: [name], male    DOB: 09-02-1960, 53 y.o.   MRN: [id number]  HPI Mr. Ryot Burrous is a 53 year old male with history of severe spinal spondylosis and chronic HF who returns for followup on pain management and medication refill. He complains about moderate to severe pain in his lower back radiating to BLE . Taking his pain medications and resting alleviate the symptoms. Walking and bending aggrevates the symptoms. He does  report decrease in falls and is using his  rollater at all times. He feels the weight loss over the past few months has made a difference and and has really helped with pain control. He reports that today his  back and bilateral feet pain are well controlled.  He continues to have  numbness and tingling LUE at times when awakening from sleep. He does report problems with constipation and does not want to use lactulose on daily basis.  He continue to use e-cigg but is trying to decrease nicotine amount. He continues to have problem with CPAP and is to follow up with Dr. Joya Gaskins this.     Review of Systems  Cardiovascular: Negative for chest pain.  Gastrointestinal: Positive for constipation.  Musculoskeletal: Positive for back pain, gait problem, myalgias and neck pain.  Neurological: Positive for weakness. Negative for dizziness and speech difficulty.       Objective:   Physical Exam  Nursing note and vitals reviewed. Constitutional: He is oriented to person, place, and time. He appears well-developed and well-nourished.  HENT:  Head: Normocephalic and atraumatic.  Eyes: Conjunctivae are normal. Pupils are equal, round, and reactive to light.  Neck: Neck supple.  Cardiovascular: Normal rate and regular rhythm.   Pulmonary/Chest: Effort normal and breath sounds normal. No respiratory distress. He has no wheezes.  Abdominal: Soft. Bowel sounds  are normal. He exhibits no distension. There is no tenderness.  Musculoskeletal: He exhibits no edema.  Symmetric normal motor tone is noted throughout. Normal muscle bulk. Muscle testing reveals 5/5 muscle strength of the upper extremity, and 5/5 of the lower extremity, except right iliopsoas 4/5, and left tibialis anterior 4-/5. Full range of motion in upper and lower extremities. ROM of spine is Restricted in extension. Sensory is intact to light touch.  DTR's present, symmetric 2+. No clonus is noted.   Patient arises from chair with slight difficulty and noted to have narrow based gait with forward flexed spine.     Neurological: He is alert and oriented to person, place, and time.  Skin: Skin is warm and dry.          Assessment & Plan:  1. Lumbar spinal stenosis with neurogenic claudication: He is not a surgical candidate--was cleared by cardiologist a couple of years ago but denied by anesthesia. He plans on getting another opinion at [facility] to see if they have something similar to offer.  Ordered outpatient PT to help with balance, strengthening and fall prevention.  Refilled:  MS contin 100 mg #90-- use tid  MSIR 30 mg # 120 use one every 6 hours prn.   2. Severe complex sleep apnea: Sleep hygiene has improved but he is having problems with the machine (too much humidification causing chocking episode) and face mask. Is to follow up with Dr. Joya Gaskins.  3. Neuropathy LUE:  Did not focus on this. CTA neck from 2012 that indicated cervical  spine disease and he has declined work up.     4. Constipation-narcotic induced:  He reports GI symptoms related to constipation and does not want to use habit forming laxative. Discussed different types of laxatives--po form as well as enemas. emphasized diet and exercise as well as a regular bowel program. Advised use of Miralax daily and to increase this to bid-tid if no BM in 3 days.

## 2013-07-27 NOTE — Patient Instructions (Signed)
Start Miralax once daily for constipation.  Increase to 2-3 times a day if no BM in 3 days.

## 2013-07-27 NOTE — Progress Notes (Signed)
Subjective:    Patient ID: [name], male    DOB: June 10, 1961, 53 y.o.   MRN: [id number]  HPI  Pain Inventory Average Pain 8 Pain Right Now 8 My pain is sharp, burning, stabbing and aching  In the last 24 hours, has pain interfered with the following? General activity 7 Relation with others 7 Enjoyment of life 6 What TIME of day is your pain at its worst? morning, day , evening Sleep (in general) Fair  Pain is worse with: walking and bending Pain improves with: rest and medication Relief from Meds: 6  Mobility walk with assistance use a walker ability to climb steps?  no do you drive?  yes  Function employed # of hrs/week 30-50 I need assistance with the following:  dressing, meal prep, household duties and shopping Do you have any goals in this area?  yes  Neuro/Psych bowel control problems numbness tingling trouble walking anxiety  Prior Studies Any changes since last visit?  no  Physicians involved in your care Any changes since last visit?  no   Family History  Problem Relation Age of Onset  . Heart disease Father 73    Died of MI   History   Social History  . Marital Status: Married    Spouse Name: N/A    Number of Children: N/A  . Years of Education: N/A   Social History Main Topics  . Smoking status: Current Some Day Smoker -- 1.50 packs/day for 29 years    Types: Cigarettes    Last Attempt to Quit: 03/27/2013  . Smokeless tobacco: Never Used     Comment: PT STOPPED SMOKING CIGS 03/27/13. NOW CURRENTLY USING E-CIG  . Alcohol Use: No  . Drug Use: Yes    Special: Marijuana     Comment: Urine showed THC  . Sexual Activity: None   Other Topics Concern  . None   Social History Narrative  . None   Past Surgical History  Procedure Laterality Date  . Hernia repair    . Asd repair, sinus venosus    . Testicle surgery      testicular cancer surgery  . Cardiac defibrillator placement  08/2010   Past Medical History  Diagnosis Date   . Chronic systolic heart failure     EF 20-25%. s/p ST. Jude ICD  . CAD (coronary artery disease)     Last cath 2/12. 3-v CAD. Failed PCI of distal RCAc  CATH [facility] 4/13 with DES to  LAD X2  . Chronic back pain     lumbar stenosis  . Benign prostatic hypertrophy   . Depression   . History of testicular cancer   . Narcotic dependence     chronic  . Benzodiazepine dependence     chronic  . Anxiety   . CHF (congestive heart failure)   . DJD (degenerative joint disease)   . Automatic implantable cardiac defibrillator [employer]     Analyze ST   BP 129/69  Pulse 83  Resp 14  Ht 6' (1.829 m)  Wt 227 lb (102.967 kg)  BMI 30.78 kg/m2  SpO2 95%     Review of Systems  Constitutional: Positive for unexpected weight change.  Respiratory: Positive for apnea.   Cardiovascular: Positive for leg swelling.  Gastrointestinal: Positive for constipation.  Genitourinary:       Bowel control problems  Musculoskeletal: Positive for back pain and gait problem.  Neurological: Positive for weakness and numbness.  Psychiatric/Behavioral: The patient is  nervous/anxious.   All other systems reviewed and are negative.       Objective:   Physical Exam        Assessment & Plan:

## 2013-08-08 ENCOUNTER — Telehealth: Payer: Self-pay | Admitting: Cardiology

## 2013-08-08 NOTE — Telephone Encounter (Signed)
Pt called saying he was a pt of Dr Zoila Shutter and Dr Caryl Comes. He says he is on a heart transplant list at [facility]. He tells me he has had several "TIAs". He thinks she may have had a TIA tonight, or he thinks it could have been his nerves. I suggested he come to the ER but he declined and said he has had these before. He would like to come to the office tomorrow to be seen and I told him I would try and arrange that.    Kerin Ransom PA-C 08/08/2013 5:41 PM

## 2013-08-10 ENCOUNTER — Ambulatory Visit
Admission: RE | Admit: 2013-08-10 | Discharge: 2013-08-10 | Disposition: A | Discharge status: Home / Self Care | Payer: BC Managed Care – PPO | Source: Ambulatory Visit | Attending: Family Medicine | Admitting: Family Medicine

## 2013-08-10 ENCOUNTER — Ambulatory Visit (INDEPENDENT_AMBULATORY_CARE_PROVIDER_SITE_OTHER): Payer: BC Managed Care – PPO | Admitting: Family Medicine

## 2013-08-10 VITALS — BP 130/80 | HR 72 | Temp 98.2°F | Resp 18 | Ht 72.0 in | Wt 221.0 lb

## 2013-08-10 DIAGNOSIS — S0101XA Laceration without foreign body of scalp, initial encounter: Secondary | ICD-10-CM

## 2013-08-10 DIAGNOSIS — R079 Chest pain, unspecified: Secondary | ICD-10-CM

## 2013-08-10 DIAGNOSIS — G459 Transient cerebral ischemic attack, unspecified: Secondary | ICD-10-CM

## 2013-08-10 DIAGNOSIS — S0100XA Unspecified open wound of scalp, initial encounter: Secondary | ICD-10-CM

## 2013-08-10 DIAGNOSIS — Z23 Encounter for immunization: Secondary | ICD-10-CM

## 2013-08-10 MED ORDER — ZOSTER VACCINE LIVE 19400 UNT/0.65ML ~~LOC~~ SOLR: 0.6500 mL | Freq: Once | SUBCUTANEOUS | Status: DC

## 2013-08-10 NOTE — Progress Notes (Signed)
VCO. Local anesthesia with 5 cc 2% lidocaine plain. Scrubbed with soap and water and rinsed. Sterile prep and drape. 4.5 cm wound closed with #7 stainless steel staples. Cleansed.

## 2013-08-10 NOTE — Patient Instructions (Addendum)
We will send you for your head CT today- I will let you know the result.   If you have any further trouble with significant CP please seek care!     WOUND CARE Please return in 9-10 days to have your stitches/staples removed or sooner if you have concerns. Marland Kitchen Keep area clean and dry for 24 hours. Do not remove bandage, if applied. . After 24 hours, remove bandage and wash wound gently with mild soap and warm water. Reapply a new bandage after cleaning wound, if directed. . Continue daily cleansing with soap and water until stitches/staples are removed. . Do not apply any ointments or creams to the wound while stitches/staples are in place, as this may cause delayed healing. . Notify the office if you experience any of the following signs of infection: Swelling, redness, pus drainage, streaking, fever >101.0 F . Notify the office if you experience excessive bleeding that does not stop after 15-20 minutes of constant, firm Pressure.  The scheduling team will contact you to set up a complete physical and to establish here for primary care.  If you have not heard from them in 1 week, please contact this office.

## 2013-08-10 NOTE — Progress Notes (Signed)
[facility] [address] [place] [address] [phone]  Date:  08/10/2013   Name:  [name]   DOB:  27-Apr-1961   MRN:  [id number]  PCP:  Hortencia Pilar, MD    Chief Complaint: Head Injury   History of Present Illness:  [name] is a 53 y.o. very pleasant male patient who presents with the following:  Here as a new patient today.  He has a very complex PHM including OSA, PAD, a pacemaker, chronic narcotic use due to chronic back pain, chronic heart failure followed by the transplant team at [facility].   He reports that "a couple of days ago I had a couple of TIAs" which seem to manifest as trouble with word finding. He states this was due to "stress at work."  These episodes lasted for about an hour.  Yesterday he was working at his desk and "dosed off," he started to tip over out of the chair and somehow hit his head on something.  This occurred around 10 or 11 last night.  He cut his scalp and it was bleeding. They applied pressure to the wound.    He is taking plavix and aspirin currently He had some chest pain the day before yesterday- it was "dull, a pressure" which lasted for 36 hours. It ended ysterday. He is currently CP free  He sees Dr. Corine Shelter at [facility] for potential heart transplant; we do not have these notes.  He has chronic back pain for spinal spondylosis which is managed by PM&R- he is taking gabapentin, MS contin 100mg  TID, and MSIR 30 mg every 6 hours as needed   Patient Active Problem List   Diagnosis Date Noted  . Obstructive chronic bronchitis with exacerbation 03/16/2013  . Erectile dysfunction 11/24/2012  . 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 MAI (mycobacterium avium-intracellulare) 05/16/2012  . 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 Pre-operative cardiovascular examination 11/21/2011  . 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 PAD (peripheral artery disease) 02/24/2011  . ICD-St.Jude  12/24/2010  . Depression 12/09/2010  . Tobacco abuse 12/09/2010  . cHistory of testicular cancer 12/09/2010  . Benzodiazepine dependence 12/09/2010  . Narcotic dependence 12/09/2010  . Chronic back pain 12/05/2010  . Anxiety disorder 10/17/2010  . CAD, NATIVE VESSEL 09/15/2010  . COMBINED HEART FAILURE, CHRONIC 09/15/2010  . CARDIOMYOPATHY, ISCHEMIC 09/12/2010    Past Medical History  Diagnosis Date  . Chronic systolic heart failure     EF 20-25%. s/p ST. Jude ICD  . CAD (coronary artery disease)     Last cath 2/12. 3-v CAD. Failed PCI of distal RCAc  CATH [facility] 4/13 with DES to  LAD X2  . Chronic back pain     lumbar stenosis  . Benign prostatic hypertrophy   . Depression   . History of testicular cancer   . Narcotic dependence     chronic  . Benzodiazepine dependence     chronic  . Anxiety   . CHF (congestive heart failure)   . DJD (degenerative joint disease)   . Automatic implantable cardiac defibrillator St Judes     Analyze ST    Past Surgical History  Procedure Laterality Date  . Hernia repair    . Asd repair, sinus venosus    . Testicle surgery      testicular cancer surgery  . Cardiac defibrillator placement  08/2010    History  Substance Use Topics  .  Smoking status: Current Some Day Smoker -- 1.50 packs/day for 29 years    Types: Cigarettes    Last Attempt to Quit: 03/27/2013  . Smokeless tobacco: Never Used     Comment: PT STOPPED SMOKING CIGS 03/27/13. NOW CURRENTLY USING E-CIG  . Alcohol Use: No    Family History  Problem Relation Age of Onset  . Heart disease Father 48    Died of MI    Allergies  Allergen Reactions  . Darvocet [Propoxyphene N-Acetaminophen] Anaphylaxis    Throat closes    Medication list has been reviewed and updated.  Current Outpatient Prescriptions on File Prior to Visit  Medication Sig Dispense Refill  . alfuzosin (UROXATRAL) 10 MG 24 hr tablet as needed.       Marland Kitchen aspirin 325 MG tablet Take 325 mg by mouth 2 (two)  times daily.       . beclomethasone (QVAR) 40 MCG/ACT inhaler Inhale 1 puff into the lungs 2 (two) times daily.  1 Inhaler  2  . clopidogrel (PLAVIX) 75 MG tablet Take 1 tablet (75 mg total) by mouth daily.  30 tablet  6  . CRESTOR 40 MG tablet TAKE 1 TABLET DAILY.  30 tablet  12  . diazepam (VALIUM) 10 MG tablet Take 10 mg by mouth 3 (three) times daily. For muscle spasms      . diclofenac sodium (VOLTAREN) 1 % GEL Apply 2 g topically 4 (four) times daily.  2 Tube  2  . ezetimibe (ZETIA) 10 MG tablet Take 1 tablet (10 mg total) by mouth daily.  30 tablet  6  . gabapentin (NEURONTIN) 300 MG capsule Take 2 tablets three times a day, Start as instructed by your provider  180 capsule  0  . lactulose (CHRONULAC) 10 GM/15ML solution Take 20 g by mouth daily as needed. For constipation      . lisinopril (PRINIVIL,ZESTRIL) 10 MG tablet Take 10 mg by mouth 2 (two) times daily.       Marland Kitchen LORazepam (ATIVAN) 1 MG tablet Take 1 mg by mouth 4 (four) times daily. For anxiety      . morphine (MS CONTIN) 100 MG 12 hr tablet Take 1 tablet (100 mg total) by mouth 3 (three) times daily.  90 tablet  0  . morphine (MSIR) 30 MG tablet Take 1 tablet (30 mg total) by mouth every 6 (six) hours as needed. For pain  120 tablet  0  . nitroGLYCERIN (NITROSTAT) 0.4 MG SL tablet Place 0.4 mg under the tongue every 5 (five) minutes as needed for chest pain.      . sildenafil (VIAGRA) 100 MG tablet Take 1 tablet (100 mg total) by mouth as needed for erectile dysfunction.  10 tablet  6  . spironolactone (ALDACTONE) 25 MG tablet Take 0.5 tablets (12.5 mg total) by mouth daily.  15 tablet  3  . testosterone cypionate (DEPOTESTOTERONE CYPIONATE) 200 MG/ML injection Inject into the muscle every 14 (fourteen) days.        Marland Kitchen torsemide (DEMADEX) 20 MG tablet Take 1 tablet (20 mg total) by mouth daily as needed. For swelling in legs  30 tablet  6  . traZODone (DESYREL) 100 MG tablet Take 100 mg by mouth at bedtime.      Marland Kitchen zolpidem (AMBIEN)  10 MG tablet Take 10 mg by mouth at bedtime as needed. For sleep       No current facility-administered medications on file prior to visit.    Review of Systems:  As per HPI- otherwise negative.   Physical Examination: Filed Vitals:   08/10/13 1021  BP: 130/80  Pulse: 72  Temp: 98.2 F (36.8 C)  Resp: 18   Filed Vitals:   08/10/13 1021  Height: 6' (1.829 m)  Weight: 221 lb (100.245 kg)   Body mass index is 29.97 kg/(m^2). Ideal Body Weight: Weight in (lb) to have BMI = 25: 183.9  GEN: WDWN, NAD, Non-toxic, overweight, sleeping in room when I came in but then woke up and was alert and oriented HEENT: Atraumatic, Normocephalic. Neck supple. No masses, No LAD. Cervical spine is non- tender, full ROM to flexion, extension and rotation left and right Ears and Nose: No external deformity. CV: RRR, No M/G/R. No JVD. No thrill. No extra heart sounds. PULM: CTA B, no wheezes, crackles, rhonchi. No retractions. No resp. distress. No accessory muscle use. ABD: S, NT, ND EXTR: No c/c/e NEURO  Normal strength, sensation and DTR all extremities.  Normal facial movement and sensation.  Gait is altered per his spinal stenosis- bends forward while walking PSYCH: Normally interactive. Conversant. Not depressed or anxious appearing.  Calm demeanor.  There is a laceration on the right occipital aspect of the scalp.  It does not appear to penetrate past the dermis.  No evidence of skull fracture, no step off  Wound repaired as per notes by [place] student PA and Harrison Mons, PA-C  EKG: SR with non- specific T wave changes and QRS widening which appears consistent with past EKG tracings.   Assessment and Plan: Laceration of scalp - Plan: Td vaccine greater than or equal to 7yo preservative free IM  Need for shingles vaccine - Plan: zoster vaccine live, PF, (ZOSTAVAX) 24401 UNT/0.65ML injection  Need for pneumococcal vaccination - Plan: Pneumococcal polysaccharide vaccine 23-valent  greater than or equal to 2yo subcutaneous/IM  TIA (transient ischemic attack) - Plan: CT Head Wo Contrast, EKG 12-Lead  Chest pain  Leum is here with a laceration and other concerning sx including chest pain and possible TIAs today.   Head laceration repaired as above. Td given, also pneumovax and zostavax rx per his request Possible TIA: discussed with pt in detail.  At this time his exam is normal.  He had a recent CT head and CT angiogram of his head as a baseline.  Will send for a CT head to rule- out bleed, acute stroke or skull fracture.  Discussed and offered a repeat CT angiogram, but at this time he feels comfortable with doing a CT alone Chest pain: was able to discuss with cardiology.  No acute change in his EKG.  Unless his CP comes back and persists there is nothing that needs to be done acutely.  Asked him to be sure to follow-up if his CP returns and he agreed  Signed Lamar Blinks, MD  Sent for CT of his head- results as below;  .CT HEAD WITHOUT CONTRAST  TECHNIQUE: Contiguous axial images were obtained from the base of the skull through the vertex without intravenous contrast.  COMPARISON: CT ANGIO HEAD W/CM &/OR WO/CM dated 10/16/2010  FINDINGS: No acute intracranial abnormality. Specifically, no hemorrhage, hydrocephalus, mass lesion, acute infarction, or significant intracranial injury. No acute calvarial abnormality. Skin staples project along the lateral vertex of the scalp on the right.  IMPRESSION: No evidence of focal or acute intracranial abnormalities.  Called a few times in the evening to try and relay these results.  Did not get an answer so LMOM that CT is  negative

## 2013-08-10 NOTE — Progress Notes (Signed)
I directly supervised and participated in the procedure and agree with the student's documentation.  

## 2013-08-14 NOTE — Progress Notes (Signed)
Made appointment with Dr Lorelei Pont for physical on 10/16/13 @ 8am.

## 2013-08-16 ENCOUNTER — Other Ambulatory Visit: Payer: Self-pay | Admitting: *Deleted

## 2013-08-16 ENCOUNTER — Telehealth: Payer: Self-pay | Admitting: Critical Care Medicine

## 2013-08-16 MED ORDER — MORPHINE SULFATE 30 MG PO TABS: 30.0000 mg | ORAL_TABLET | Freq: Four times a day (QID) | ORAL | Status: DC | PRN

## 2013-08-16 MED ORDER — MORPHINE SULFATE ER 100 MG PO TBCR: 100.0000 mg | EXTENDED_RELEASE_TABLET | Freq: Three times a day (TID) | ORAL | Status: DC

## 2013-08-16 NOTE — Telephone Encounter (Signed)
RX printed early for controlled medication for the visit with RN on 08/23/13 (to be signed by MD)

## 2013-08-16 NOTE — Telephone Encounter (Signed)
Noted  

## 2013-08-18 ENCOUNTER — Encounter: Payer: BC Managed Care – PPO | Admitting: Internal Medicine

## 2013-08-21 ENCOUNTER — Encounter: Payer: BC Managed Care – PPO | Admitting: Internal Medicine

## 2013-08-23 ENCOUNTER — Encounter: Payer: BC Managed Care – PPO | Attending: Physical Medicine & Rehabilitation | Admitting: *Deleted

## 2013-08-23 ENCOUNTER — Ambulatory Visit (INDEPENDENT_AMBULATORY_CARE_PROVIDER_SITE_OTHER): Payer: BC Managed Care – PPO | Admitting: Physician Assistant

## 2013-08-23 ENCOUNTER — Encounter: Payer: Self-pay | Admitting: *Deleted

## 2013-08-23 ENCOUNTER — Ambulatory Visit: Payer: BC Managed Care – PPO

## 2013-08-23 VITALS — BP 111/57 | HR 71 | Resp 14 | Wt 221.0 lb

## 2013-08-23 VITALS — BP 122/74 | HR 79 | Temp 98.9°F | Resp 17 | Ht 71.0 in | Wt 221.0 lb

## 2013-08-23 DIAGNOSIS — M545 Low back pain, unspecified: Secondary | ICD-10-CM | POA: Insufficient documentation

## 2013-08-23 DIAGNOSIS — G4730 Sleep apnea, unspecified: Secondary | ICD-10-CM | POA: Insufficient documentation

## 2013-08-23 DIAGNOSIS — Z23 Encounter for immunization: Secondary | ICD-10-CM

## 2013-08-23 DIAGNOSIS — I509 Heart failure, unspecified: Secondary | ICD-10-CM | POA: Insufficient documentation

## 2013-08-23 DIAGNOSIS — G8929 Other chronic pain: Secondary | ICD-10-CM

## 2013-08-23 DIAGNOSIS — S0100XA Unspecified open wound of scalp, initial encounter: Secondary | ICD-10-CM

## 2013-08-23 DIAGNOSIS — Z95810 Presence of automatic (implantable) cardiac defibrillator: Secondary | ICD-10-CM | POA: Insufficient documentation

## 2013-08-23 DIAGNOSIS — I5022 Chronic systolic (congestive) heart failure: Secondary | ICD-10-CM | POA: Insufficient documentation

## 2013-08-23 DIAGNOSIS — F17200 Nicotine dependence, unspecified, uncomplicated: Secondary | ICD-10-CM | POA: Insufficient documentation

## 2013-08-23 DIAGNOSIS — I2510 Atherosclerotic heart disease of native coronary artery without angina pectoris: Secondary | ICD-10-CM | POA: Insufficient documentation

## 2013-08-23 DIAGNOSIS — M549 Dorsalgia, unspecified: Secondary | ICD-10-CM

## 2013-08-23 DIAGNOSIS — M48062 Spinal stenosis, lumbar region with neurogenic claudication: Secondary | ICD-10-CM | POA: Insufficient documentation

## 2013-08-23 DIAGNOSIS — G5690 Unspecified mononeuropathy of unspecified upper limb: Secondary | ICD-10-CM | POA: Insufficient documentation

## 2013-08-23 DIAGNOSIS — S0101XA Laceration without foreign body of scalp, initial encounter: Secondary | ICD-10-CM

## 2013-08-23 DIAGNOSIS — K5900 Constipation, unspecified: Secondary | ICD-10-CM | POA: Insufficient documentation

## 2013-08-23 MED ORDER — ZOSTER VACCINE LIVE 19400 UNT/0.65ML ~~LOC~~ SOLR: 0.6500 mL | Freq: Once | SUBCUTANEOUS | Status: DC

## 2013-08-23 NOTE — Progress Notes (Signed)
Here for pill count and medication refills.  07/27/13 # 120  Today NV# 3  Morphine Sulfate ER Fill date   07/27/13 Today NV# 7  VSS  He has staples in head and is asking about having them removed but I am unable to do that without a physicians order and Dr Letta Pate is not in the office today.   He thinks his defibrillator fired and threw him back in his desk chair into a credenza with a marble top cutting his scalp.  He will have to return to the urgent care where he received treament to get the staples removed. Falls have been less of a problem for him lately than they have been in the past, but he is still a high fall risk.  I have given him a handout on fall prevention in the home to take with him. His pill counts were appropriate and I have given him refills on these medications.  He will return to the office for a refill and pill count next month.

## 2013-08-23 NOTE — Progress Notes (Signed)
I have examined this patient along with the student and agree.  

## 2013-08-23 NOTE — Progress Notes (Signed)
   Subjective:    Patient ID: [name], male    DOB: 10-Apr-1961, 53 y.o.   MRN: [id number]  HPI  Patient presents for removal of staples from his scalp. Wound on the right scalp closed on 08/10/13. He reports he is doing well and not having any problems except his wife would not remove the staples for him and made him come in. Denies drainage or pain from wound.    Review of Systems As above.     Objective:   Physical Exam  Wound has healed nicely although were was some scabbing.  No drainage or erythema appreciated.  #7 stainless steel staples removed without incident.      Assessment & Plan:   1. Wound of Scalp Continue wound care. Follow up as needed.

## 2013-08-23 NOTE — Patient Instructions (Signed)
Follow up in one month with RN for med refills

## 2013-08-30 ENCOUNTER — Ambulatory Visit: Payer: BC Managed Care – PPO

## 2013-08-31 ENCOUNTER — Ambulatory Visit (INDEPENDENT_AMBULATORY_CARE_PROVIDER_SITE_OTHER): Payer: BC Managed Care – PPO | Admitting: Internal Medicine

## 2013-08-31 ENCOUNTER — Encounter: Payer: Self-pay | Admitting: Internal Medicine

## 2013-08-31 VITALS — BP 153/92 | HR 74 | Ht 72.0 in | Wt 226.0 lb

## 2013-08-31 DIAGNOSIS — I255 Ischemic cardiomyopathy: Secondary | ICD-10-CM

## 2013-08-31 DIAGNOSIS — I5042 Chronic combined systolic (congestive) and diastolic (congestive) heart failure: Secondary | ICD-10-CM

## 2013-08-31 DIAGNOSIS — I2589 Other forms of chronic ischemic heart disease: Secondary | ICD-10-CM

## 2013-08-31 DIAGNOSIS — Z95810 Presence of automatic (implantable) cardiac defibrillator: Secondary | ICD-10-CM

## 2013-08-31 LAB — CBC WITH DIFFERENTIAL/PLATELET
Basophils Absolute: 0 10*3/uL (ref 0.0–0.1)
Basophils Relative: 0.4 % (ref 0.0–3.0)
Eosinophils Absolute: 0.1 10*3/uL (ref 0.0–0.7)
Eosinophils Relative: 1.2 % (ref 0.0–5.0)
HCT: 58.5 % — ABNORMAL HIGH (ref 39.0–52.0)
Hemoglobin: 19.6 g/dL (ref 13.0–17.0)
Lymphocytes Relative: 11.9 % — ABNORMAL LOW (ref 12.0–46.0)
Lymphs Abs: 1.1 10*3/uL (ref 0.7–4.0)
MCHC: 33.4 g/dL (ref 30.0–36.0)
MCV: 100.3 fl — ABNORMAL HIGH (ref 78.0–100.0)
Monocytes Absolute: 0.7 10*3/uL (ref 0.1–1.0)
Monocytes Relative: 7.6 % (ref 3.0–12.0)
Neutro Abs: 7 10*3/uL (ref 1.4–7.7)
Neutrophils Relative %: 78.9 % — ABNORMAL HIGH (ref 43.0–77.0)
Platelets: 156 10*3/uL (ref 150.0–400.0)
RBC: 5.84 Mil/uL — ABNORMAL HIGH (ref 4.22–5.81)
RDW: 14 % (ref 11.5–14.6)
WBC: 8.8 10*3/uL (ref 4.5–10.5)

## 2013-08-31 LAB — MDC_IDC_ENUM_SESS_TYPE_INCLINIC
Battery Remaining Longevity: 80.4 mo
Brady Statistic RA Percent Paced: 0.74 %
Brady Statistic RV Percent Paced: 0.15 %
Date Time Interrogation Session: 20150219135748
HighPow Impedance: 73.125
Implantable Pulse Generator Serial Number: 815096
Lead Channel Impedance Value: 337.5 Ohm
Lead Channel Impedance Value: 512.5 Ohm
Lead Channel Pacing Threshold Amplitude: 0.5 V
Lead Channel Pacing Threshold Amplitude: 0.5 V
Lead Channel Pacing Threshold Amplitude: 2 V
Lead Channel Pacing Threshold Amplitude: 2 V
Lead Channel Pacing Threshold Pulse Width: 0.5 ms
Lead Channel Pacing Threshold Pulse Width: 0.5 ms
Lead Channel Pacing Threshold Pulse Width: 0.5 ms
Lead Channel Pacing Threshold Pulse Width: 0.5 ms
Lead Channel Sensing Intrinsic Amplitude: 12 mV
Lead Channel Sensing Intrinsic Amplitude: 4.3 mV
Lead Channel Setting Pacing Amplitude: 2 V
Lead Channel Setting Pacing Amplitude: 3 V
Lead Channel Setting Pacing Pulse Width: 0.5 ms
Lead Channel Setting Sensing Sensitivity: 0.5 mV
Zone Setting Detection Interval: 270 ms
Zone Setting Detection Interval: 340 ms
Zone Setting Detection Interval: 400 ms

## 2013-08-31 LAB — BASIC METABOLIC PANEL
BUN: 12 mg/dL (ref 6–23)
CO2: 28 mEq/L (ref 19–32)
Calcium: 10 mg/dL (ref 8.4–10.5)
Chloride: 91 mEq/L — ABNORMAL LOW (ref 96–112)
Creatinine, Ser: 1 mg/dL (ref 0.4–1.5)
GFR: 79.17 mL/min (ref >60.00)
Glucose, Bld: 92 mg/dL (ref 70–99)
Potassium: 5 mEq/L (ref 3.5–5.1)
Sodium: 131 mEq/L — ABNORMAL LOW (ref 135–145)

## 2013-08-31 NOTE — Assessment & Plan Note (Signed)
The patient's device was interrogated.  The information was reviewed. No changes were made in the programming.    

## 2013-08-31 NOTE — Assessment & Plan Note (Signed)
euvolemic  Will check BMET on aldactone

## 2013-08-31 NOTE — Patient Instructions (Addendum)
Your physician has recommended you make the following change in your medication:  1) Decrease Aspirin 81 mg twice daily  Your physician recommends that you return for lab work today: BMET/CBCD  Your physician wants you to follow-up in: 6 months with device clinic.  You will receive a reminder letter in the mail two months in advance. If you don't receive a letter, please call our office to schedule the follow-up appointment.  Your physician wants you to follow-up in: 1 year with Dr. Caryl Comes.  You will receive a reminder letter in the mail two months in advance. If you don't receive a letter, please call our office to schedule the follow-up appointment.

## 2013-08-31 NOTE — Progress Notes (Signed)
Patient Care Team: Jolaine Artist, MD as PCP - General (Cardiology) Elsie Stain, MD as Consulting Physician (Pulmonary Disease)   HPI  [name] is a 53 y.o. male  Seen in followup for ICD implantation in the setting of ischemic cardiomyopathy with ejection fraction of 25% range not amenable to revascularization. He also had complex ventricular ectopy. He is part of the analyze ST trial.   He also has history of tobacco use, obesity, OSA, depression, chronic back pain on high-dose narcotics  Cath showed (2/11):  LM: ostial 20%  LAD: Diffuse 40% prox, mid 60-70% focal lesion. D1: 30% tubular lesion.  LCX: gave off a ramus branch, small OM-1. The distal AV groove circ was subtotally occluded which was chronic. In the ramus branch, there was evidence of a previously placed stent. This was now totally occluded. In the OM-1, there was a 40% proximal lesion.  RCA: dominant vessel, had diffuse 40-50% disease throughout. In the distal RCA, prior to the PDA, there was a tubular 40% lesion. In the distal RCA just after the takeoff of the PDA, there was a 99% lesion with a near subtotal occlusion. In the PDA, there was a 50% tubular lesion distally.  LV 20%. Failed PCI of distal RCA.    he apparently is followed at [facility] failure service  He has problems with narcotic addiction from chronic pain  He expresses frustration at loss of connection with Dr DB  He has multiple complaints which I can't identify specifically related to transient word finding difficulties.  He has lost 30 pounds and he is much better with less back pain and left leg numbness. He is concerned however that he has not been intentional in his weight loss.  Past Medical History  Diagnosis Date  . Chronic systolic heart failure     EF 20-25%. s/p ST. Jude ICD  . CAD (coronary artery disease)     Last cath 2/12. 3-v CAD. Failed PCI of distal RCAc  CATH [facility] 4/13 with DES to  LAD X2  . Chronic back  pain     lumbar stenosis  . Benign prostatic hypertrophy   . Depression   . History of testicular cancer   . Narcotic dependence     chronic  . Benzodiazepine dependence     chronic  . Anxiety   . CHF (congestive heart failure)   . DJD (degenerative joint disease)   . Automatic implantable cardiac defibrillator St Judes     Analyze ST    Past Surgical History  Procedure Laterality Date  . Hernia repair    . Asd repair, sinus venosus    . Testicle surgery      testicular cancer surgery  . Cardiac defibrillator placement  08/2010    Current Outpatient Prescriptions  Medication Sig Dispense Refill  . alfuzosin (UROXATRAL) 10 MG 24 hr tablet as needed.       Marland Kitchen aspirin 325 MG tablet Take 325 mg by mouth 2 (two) times daily.       . beclomethasone (QVAR) 40 MCG/ACT inhaler Inhale 1 puff into the lungs 2 (two) times daily.  1 Inhaler  2  . carvedilol (COREG) 25 MG tablet Take 25 mg by mouth 2 (two) times daily with a meal.      . clopidogrel (PLAVIX) 75 MG tablet Take 1 tablet (75 mg total) by mouth daily.  30 tablet  6  . CRESTOR 40 MG tablet TAKE 1 TABLET DAILY.  30 tablet  12  . diazepam (VALIUM) 10 MG tablet Take 10 mg by mouth 3 (three) times daily. For muscle spasms      . ezetimibe (ZETIA) 10 MG tablet Take 1 tablet (10 mg total) by mouth daily.  30 tablet  6  . gabapentin (NEURONTIN) 300 MG capsule 2 (two) times daily.      Marland Kitchen lactulose (CHRONULAC) 10 GM/15ML solution Take 20 g by mouth daily as needed. For constipation      . lisinopril (PRINIVIL,ZESTRIL) 10 MG tablet Take 10 mg by mouth 2 (two) times daily.       Marland Kitchen LORazepam (ATIVAN) 1 MG tablet Take 1 mg by mouth 4 (four) times daily. For anxiety      . morphine (MS CONTIN) 100 MG 12 hr tablet Take 1 tablet (100 mg total) by mouth 3 (three) times daily.  90 tablet  0  . morphine (MSIR) 30 MG tablet Take 1 tablet (30 mg total) by mouth every 6 (six) hours as needed. For pain  120 tablet  0  . nitroGLYCERIN (NITROSTAT) 0.4 MG  SL tablet Place 0.4 mg under the tongue every 5 (five) minutes as needed for chest pain.      . sildenafil (VIAGRA) 100 MG tablet Take 1 tablet (100 mg total) by mouth as needed for erectile dysfunction.  10 tablet  6  . spironolactone (ALDACTONE) 25 MG tablet Take 0.5 tablets (12.5 mg total) by mouth daily.  15 tablet  3  . testosterone cypionate (DEPOTESTOTERONE CYPIONATE) 200 MG/ML injection Inject into the muscle every 14 (fourteen) days.        Marland Kitchen torsemide (DEMADEX) 20 MG tablet Take 1 tablet (20 mg total) by mouth daily as needed. For swelling in legs  30 tablet  6  . traZODone (DESYREL) 100 MG tablet Take 100 mg by mouth at bedtime.      Marland Kitchen zolpidem (AMBIEN) 10 MG tablet Take 10 mg by mouth at bedtime as needed. For sleep      . zoster vaccine live, PF, (ZOSTAVAX) 70962 UNT/0.65ML injection Inject 19,400 Units into the skin once.  0.65 mL  0   No current facility-administered medications for this visit.    Allergies  Allergen Reactions  . Darvocet [Propoxyphene N-Acetaminophen] Anaphylaxis    Throat closes    Review of Systems negative except from HPI and PMH  Physical Exam BP 153/92  Pulse 74  Ht 6' (1.829 m)  Wt 226 lb (102.513 kg)  BMI 30.64 kg/m2 Well developed and nourished in no acute distress HENT normal Neck supple with JVP-flat Clear Regular rate and rhythm, no murmurs or gallops Abd-soft with active BS No Clubbing cyanosis edema Skin-warm and dry A & Oriented  Grossly normal sensory and motor function    Assessment and  Plan

## 2013-08-31 NOTE — Assessment & Plan Note (Signed)
Stable on current meds 

## 2013-09-01 ENCOUNTER — Telehealth: Payer: Self-pay | Admitting: *Deleted

## 2013-09-01 ENCOUNTER — Telehealth: Payer: Self-pay | Admitting: Internal Medicine

## 2013-09-01 NOTE — Telephone Encounter (Signed)
Follow Up   Pt returning call from earlier. Please call back.

## 2013-09-01 NOTE — Telephone Encounter (Signed)
A user error has taken place: encounter opened in error, closed for administrative reasons.

## 2013-09-01 NOTE — Telephone Encounter (Signed)
Called pt this morning about repeat lab work secondary to elevated Hgb lab yesterday (see documentation under lab work). Am discussing PCP referral w/ Caryl Comes and then getting back to pt with referral and schedule repeat lab work

## 2013-09-05 ENCOUNTER — Ambulatory Visit: Payer: BC Managed Care – PPO

## 2013-09-05 ENCOUNTER — Other Ambulatory Visit: Payer: Self-pay | Admitting: *Deleted

## 2013-09-05 DIAGNOSIS — D582 Other hemoglobinopathies: Secondary | ICD-10-CM

## 2013-09-05 NOTE — Telephone Encounter (Signed)
Advised patient that any PCP will be appropriate. We discussed possible referral to hematologist because of increased Hbb - pt seeing Dr. Stann Mainland at [facility] next week and would like to discuss with him first. Pt will call me after that appointment to inform us of decision. Repeat CBC scheduled for Monday 3/2. Pt agreeable to plan.

## 2013-09-06 ENCOUNTER — Other Ambulatory Visit: Payer: Self-pay | Admitting: Internal Medicine

## 2013-09-11 ENCOUNTER — Other Ambulatory Visit (INDEPENDENT_AMBULATORY_CARE_PROVIDER_SITE_OTHER): Payer: BC Managed Care – PPO

## 2013-09-11 ENCOUNTER — Ambulatory Visit: Payer: BC Managed Care – PPO

## 2013-09-11 DIAGNOSIS — D582 Other hemoglobinopathies: Secondary | ICD-10-CM

## 2013-09-11 LAB — CBC WITH DIFFERENTIAL/PLATELET
Basophils Absolute: 0 10*3/uL (ref 0.0–0.1)
Basophils Relative: 0.4 % (ref 0.0–3.0)
Eosinophils Absolute: 0.2 10*3/uL (ref 0.0–0.7)
Eosinophils Relative: 2.6 % (ref 0.0–5.0)
HCT: 55.8 % — ABNORMAL HIGH (ref 39.0–52.0)
Hemoglobin: 18.7 g/dL (ref 13.0–17.0)
Lymphocytes Relative: 21.8 % (ref 12.0–46.0)
Lymphs Abs: 1.6 10*3/uL (ref 0.7–4.0)
MCHC: 33.5 g/dL (ref 30.0–36.0)
MCV: 99.2 fl (ref 78.0–100.0)
Monocytes Absolute: 0.6 10*3/uL (ref 0.1–1.0)
Monocytes Relative: 8.5 % (ref 3.0–12.0)
Neutro Abs: 4.9 10*3/uL (ref 1.4–7.7)
Neutrophils Relative %: 66.7 % (ref 43.0–77.0)
Platelets: 140 10*3/uL — ABNORMAL LOW (ref 150.0–400.0)
RBC: 5.62 Mil/uL (ref 4.22–5.81)
RDW: 13.6 % (ref 11.5–14.6)
WBC: 7.4 10*3/uL (ref 4.5–10.5)

## 2013-09-12 ENCOUNTER — Telehealth: Payer: Self-pay | Admitting: Internal Medicine

## 2013-09-12 ENCOUNTER — Encounter: Payer: Self-pay | Admitting: *Deleted

## 2013-09-12 ENCOUNTER — Ambulatory Visit: Payer: BC Managed Care – PPO | Admitting: Physical Therapy

## 2013-09-12 NOTE — Telephone Encounter (Signed)
New message     Need a referral to a hematologist at [facility]

## 2013-09-12 NOTE — Telephone Encounter (Signed)
Follow up     Patient coming to office pick up blood work results -  [name]    Referral to hematologist @ [facility].

## 2013-09-12 NOTE — Telephone Encounter (Signed)
Advised him to speak with his heart doctor tomorrow at [facility] about hematologist referral. Pt requesting [facility] hematologist, Caryl Comes is not familiar with [facility] hematology department there.

## 2013-09-18 ENCOUNTER — Other Ambulatory Visit: Payer: Self-pay | Admitting: *Deleted

## 2013-09-18 DIAGNOSIS — D751 Secondary polycythemia: Secondary | ICD-10-CM | POA: Insufficient documentation

## 2013-09-18 MED ORDER — MORPHINE SULFATE 30 MG PO TABS: 30.0000 mg | ORAL_TABLET | Freq: Four times a day (QID) | ORAL | Status: DC | PRN

## 2013-09-18 MED ORDER — MORPHINE SULFATE ER 100 MG PO TBCR: 100.0000 mg | EXTENDED_RELEASE_TABLET | Freq: Three times a day (TID) | ORAL | Status: DC

## 2013-09-18 NOTE — Telephone Encounter (Signed)
RX printed for MD to sign for RN visit 09/19/13

## 2013-09-19 ENCOUNTER — Encounter: Payer: Self-pay | Admitting: *Deleted

## 2013-09-19 ENCOUNTER — Encounter: Payer: BC Managed Care – PPO | Attending: Physical Medicine & Rehabilitation | Admitting: *Deleted

## 2013-09-19 ENCOUNTER — Ambulatory Visit: Payer: BC Managed Care – PPO

## 2013-09-19 VITALS — BP 133/53 | HR 75 | Resp 14 | Wt 221.0 lb

## 2013-09-19 DIAGNOSIS — F329 Major depressive disorder, single episode, unspecified: Secondary | ICD-10-CM | POA: Insufficient documentation

## 2013-09-19 DIAGNOSIS — R269 Unspecified abnormalities of gait and mobility: Secondary | ICD-10-CM | POA: Insufficient documentation

## 2013-09-19 DIAGNOSIS — I509 Heart failure, unspecified: Secondary | ICD-10-CM | POA: Insufficient documentation

## 2013-09-19 DIAGNOSIS — F3289 Other specified depressive episodes: Secondary | ICD-10-CM | POA: Insufficient documentation

## 2013-09-19 DIAGNOSIS — M48062 Spinal stenosis, lumbar region with neurogenic claudication: Secondary | ICD-10-CM

## 2013-09-19 DIAGNOSIS — I2510 Atherosclerotic heart disease of native coronary artery without angina pectoris: Secondary | ICD-10-CM | POA: Insufficient documentation

## 2013-09-19 DIAGNOSIS — Z9181 History of falling: Secondary | ICD-10-CM | POA: Insufficient documentation

## 2013-09-19 DIAGNOSIS — G4733 Obstructive sleep apnea (adult) (pediatric): Secondary | ICD-10-CM | POA: Insufficient documentation

## 2013-09-19 DIAGNOSIS — Z95810 Presence of automatic (implantable) cardiac defibrillator: Secondary | ICD-10-CM | POA: Insufficient documentation

## 2013-09-19 DIAGNOSIS — I5022 Chronic systolic (congestive) heart failure: Secondary | ICD-10-CM | POA: Insufficient documentation

## 2013-09-19 DIAGNOSIS — G4737 Central sleep apnea in conditions classified elsewhere: Secondary | ICD-10-CM | POA: Insufficient documentation

## 2013-09-19 DIAGNOSIS — G8929 Other chronic pain: Secondary | ICD-10-CM | POA: Insufficient documentation

## 2013-09-19 NOTE — Patient Instructions (Addendum)
Follow up one month with RN and 2 month with [facility]

## 2013-09-19 NOTE — Progress Notes (Signed)
Here for pill count and medication refills.  MS CONTIN 100 mg  #90 Fill date 08/23/13    Today NV# 6, MSIR  30 mg #120 Fill date 08/23/13 Today  NV# 4  VSS   [name] is in today after having a bone marrow biopsy at [facility] this week.  He is saying he was not prepared for how painful it was going to be in spite of the sedative he was given. As a result he has taken  extra MSIR to cope with the pain.  He is on day 28 of rx and has 4 pills for which he should have 8.  He is scheuled to have a stent placed in his heart on Thursday.  He will return to the clinic for med refill next month and see Dr Letta Pate in May.  Refills were given.  He has had no falls since last visit and was given a handout and educated on fall risks in the home at last appt.

## 2013-09-20 ENCOUNTER — Ambulatory Visit: Payer: BC Managed Care – PPO

## 2013-09-25 DIAGNOSIS — R0902 Hypoxemia: Secondary | ICD-10-CM | POA: Insufficient documentation

## 2013-10-03 ENCOUNTER — Ambulatory Visit: Payer: BC Managed Care – PPO | Admitting: Pulmonary Disease

## 2013-10-11 ENCOUNTER — Other Ambulatory Visit: Payer: Self-pay | Admitting: *Deleted

## 2013-10-11 MED ORDER — MORPHINE SULFATE 30 MG PO TABS: 30.0000 mg | ORAL_TABLET | Freq: Four times a day (QID) | ORAL | Status: DC | PRN

## 2013-10-11 MED ORDER — MORPHINE SULFATE ER 100 MG PO TBCR: 100.0000 mg | EXTENDED_RELEASE_TABLET | Freq: Three times a day (TID) | ORAL | Status: DC

## 2013-10-16 ENCOUNTER — Encounter: Payer: BC Managed Care – PPO | Admitting: Family Medicine

## 2013-10-17 ENCOUNTER — Encounter: Payer: BC Managed Care – PPO | Attending: Physical Medicine & Rehabilitation | Admitting: *Deleted

## 2013-10-17 VITALS — BP 121/62 | HR 52 | Resp 14 | Wt 221.8 lb

## 2013-10-17 DIAGNOSIS — F329 Major depressive disorder, single episode, unspecified: Secondary | ICD-10-CM | POA: Insufficient documentation

## 2013-10-17 DIAGNOSIS — G4733 Obstructive sleep apnea (adult) (pediatric): Secondary | ICD-10-CM | POA: Insufficient documentation

## 2013-10-17 DIAGNOSIS — M48062 Spinal stenosis, lumbar region with neurogenic claudication: Secondary | ICD-10-CM

## 2013-10-17 DIAGNOSIS — Z95810 Presence of automatic (implantable) cardiac defibrillator: Secondary | ICD-10-CM | POA: Insufficient documentation

## 2013-10-17 DIAGNOSIS — F3289 Other specified depressive episodes: Secondary | ICD-10-CM | POA: Insufficient documentation

## 2013-10-17 DIAGNOSIS — Z9181 History of falling: Secondary | ICD-10-CM | POA: Insufficient documentation

## 2013-10-17 DIAGNOSIS — R269 Unspecified abnormalities of gait and mobility: Secondary | ICD-10-CM | POA: Insufficient documentation

## 2013-10-17 DIAGNOSIS — G8929 Other chronic pain: Secondary | ICD-10-CM

## 2013-10-17 DIAGNOSIS — I5022 Chronic systolic (congestive) heart failure: Secondary | ICD-10-CM | POA: Insufficient documentation

## 2013-10-17 DIAGNOSIS — I509 Heart failure, unspecified: Secondary | ICD-10-CM | POA: Insufficient documentation

## 2013-10-17 DIAGNOSIS — I2510 Atherosclerotic heart disease of native coronary artery without angina pectoris: Secondary | ICD-10-CM | POA: Insufficient documentation

## 2013-10-17 DIAGNOSIS — M549 Dorsalgia, unspecified: Secondary | ICD-10-CM

## 2013-10-17 DIAGNOSIS — G4737 Central sleep apnea in conditions classified elsewhere: Secondary | ICD-10-CM | POA: Insufficient documentation

## 2013-10-17 NOTE — Patient Instructions (Signed)
Follow up next month with Dr Letta Pate

## 2013-10-17 NOTE — Progress Notes (Signed)
Here for pill count and medication refills. MS Contin 100 mg # 90 Fill date 09/19/13  Today NV#8  MSIR # 120 Fill date 09/19/13 Today NV#2   [name] is feeling tired today.  His BP is 121/62 but his pulse is runnning 52 today which it normally runs in the 70-80's.  I rechecked his pulse  And it is runnin in low 50's.  I told him this could explain why he is feeling very fatigued.  I told him to follow up with it at home, especially before taking any of his beta blocker. He needs to follow up with his cardiologist if he continues to feel fatigued and his pulse remains slow. I have given him refills and he has an appt to follow up with Dr Letta Pate next month.

## 2013-10-20 ENCOUNTER — Ambulatory Visit: Payer: BC Managed Care – PPO | Admitting: Pulmonary Disease

## 2013-10-23 ENCOUNTER — Ambulatory Visit: Payer: BC Managed Care – PPO

## 2013-10-23 ENCOUNTER — Ambulatory Visit (INDEPENDENT_AMBULATORY_CARE_PROVIDER_SITE_OTHER): Payer: BC Managed Care – PPO | Admitting: Family Medicine

## 2013-10-23 ENCOUNTER — Encounter: Payer: Self-pay | Admitting: Family Medicine

## 2013-10-23 VITALS — BP 120/82 | HR 64 | Temp 98.2°F | Resp 16 | Ht 70.5 in | Wt 219.0 lb

## 2013-10-23 DIAGNOSIS — Q7649 Other congenital malformations of spine, not associated with scoliosis: Secondary | ICD-10-CM

## 2013-10-23 DIAGNOSIS — K047 Periapical abscess without sinus: Secondary | ICD-10-CM

## 2013-10-23 DIAGNOSIS — K044 Acute apical periodontitis of pulpal origin: Secondary | ICD-10-CM

## 2013-10-23 MED ORDER — PENICILLIN V POTASSIUM 500 MG PO TABS: 500.0000 mg | ORAL_TABLET | Freq: Two times a day (BID) | ORAL | Status: DC

## 2013-10-23 NOTE — Patient Instructions (Signed)
We can try a course of penicillin for your teeth- I have sent this to your drug store.  Your x-rays look ok to me but I will be in touch with your formal reports.

## 2013-10-23 NOTE — Progress Notes (Signed)
[facility] [address] [place] [address] [phone]  Date:  10/23/2013   Name:  EIRIK SCHUELER   DOB:  11-Sep-1960   MRN:  [id number]  PCP:  Glori Bickers, MD    Chief Complaint: per patient may need oxygen and Back Pain   History of Present Illness:  JOSHIA KITCHINGS is a 53 y.o. very pleasant male patient who presents with the following:  Seen by myself once in January of this year after he passed out and cut his scalp.  At that time he thought he was having "TIAs" which he stated are normal for him.  Also noted to be on high doses of chronic narcotics and benzodiazepines.   His father passed away yesterday.  This was not unexpected.    He is here today to discuss a few things- his history is somewhat disjointed and it is not immediately clear why he is here today. He was noted to have polycythemia recently. He is seeing hematology at [facility]. He is seeing them and Dr. Gwenette Greet- they plan to have oxygen started maybe at night.  He also uses CPAP already.  He has also lost some weight recently- he thinks he has lost 45 lbs over 6 months.  It seems he was told he might have an abnormal finding in his spine on an echo and was told to get x-rays of his thoracic spine.   He had testicular cancer as a younger man.  He did have surgery in 2002 for this, was told he is in remission.    He is on the cardiac transplant program at [facility].  He states he was told he might need an x-ray of his back due to something they saw on some other test at [facility].   He also has some sort of valve problem that is being addressed.     He also is concerned about his teeth; "my Ph balance is off and it is making my teeth rot out.  I am seeing a faculty member at [facility] and hopefully I won't have to pay because my father built the heart wing there."  He wonders if antibiotics might be helpful for his gums  [place] now wishes to establish care with me as a PCP.  This is ok, but we agreed that I will not  be writing his narcotics or benzodiazepines.   Patient Active Problem List   Diagnosis Date Noted  . Obstructive chronic bronchitis with exacerbation 03/16/2013  . Erectile dysfunction 11/24/2012  . 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 MAI (mycobacterium avium-intracellulare) 05/16/2012  . 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 Pre-operative cardiovascular examination 11/21/2011  . 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 PAD (peripheral artery disease) 02/24/2011  . ICD-St.Jude 12/24/2010  . Depression 12/09/2010  . Tobacco abuse 12/09/2010  . cHistory of testicular cancer 12/09/2010  . Benzodiazepine dependence 12/09/2010  . Narcotic dependence 12/09/2010  . Chronic back pain 12/05/2010  . Anxiety disorder 10/17/2010  . CAD, NATIVE VESSEL 09/15/2010  . COMBINED HEART FAILURE, CHRONIC 09/15/2010  . CARDIOMYOPATHY, ISCHEMIC 09/12/2010    Past Medical History  Diagnosis Date  . Chronic systolic heart failure     EF 20-25%. s/p ST. Jude ICD  . CAD (coronary artery disease)     Last cath 2/12. 3-v CAD. Failed PCI of distal RCAc  CATH [facility] 4/13 with DES to  LAD X2  . Chronic back pain  lumbar stenosis  . Benign prostatic hypertrophy   . Depression   . History of testicular cancer   . Narcotic dependence     chronic  . Benzodiazepine dependence     chronic  . Anxiety   . CHF (congestive heart failure)   . DJD (degenerative joint disease)   . Automatic implantable cardiac defibrillator St Judes     Analyze ST    Past Surgical History  Procedure Laterality Date  . Hernia repair    . Asd repair, sinus venosus    . Testicle surgery      testicular cancer surgery  . Cardiac defibrillator placement  08/2010    History  Substance Use Topics  . Smoking status: Current Some Day Smoker -- 1.50 packs/day for 29 years    Types: Cigarettes    Last Attempt to Quit: 03/27/2013  . Smokeless tobacco: Never Used     Comment: PT STOPPED  SMOKING CIGS 03/27/13. NOW CURRENTLY USING E-CIG  . Alcohol Use: No    Family History  Problem Relation Age of Onset  . Heart disease Father 51    Died of MI    Allergies  Allergen Reactions  . Darvocet [Propoxyphene N-Acetaminophen] Anaphylaxis    Throat closes    Medication list has been reviewed and updated.  Current Outpatient Prescriptions on File Prior to Visit  Medication Sig Dispense Refill  . alfuzosin (UROXATRAL) 10 MG 24 hr tablet as needed.       Marland Kitchen aspirin 325 MG tablet Take 325 mg by mouth 2 (two) times daily.       . beclomethasone (QVAR) 40 MCG/ACT inhaler Inhale 1 puff into the lungs 2 (two) times daily.  1 Inhaler  2  . carvedilol (COREG) 25 MG tablet Take 25 mg by mouth 2 (two) times daily with a meal.      . clopidogrel (PLAVIX) 75 MG tablet Take 1 tablet (75 mg total) by mouth daily.  30 tablet  6  . CRESTOR 40 MG tablet TAKE 1 TABLET DAILY.  30 tablet  12  . diazepam (VALIUM) 10 MG tablet Take 10 mg by mouth 3 (three) times daily. For muscle spasms      . ezetimibe (ZETIA) 10 MG tablet Take 1 tablet (10 mg total) by mouth daily.  30 tablet  6  . gabapentin (NEURONTIN) 300 MG capsule 2 (two) times daily.      Marland Kitchen lactulose (CHRONULAC) 10 GM/15ML solution Take 20 g by mouth daily as needed. For constipation      . lisinopril (PRINIVIL,ZESTRIL) 10 MG tablet Take 10 mg by mouth 2 (two) times daily.       Marland Kitchen LORazepam (ATIVAN) 1 MG tablet Take 1 mg by mouth 4 (four) times daily. For anxiety      . morphine (MS CONTIN) 100 MG 12 hr tablet Take 1 tablet (100 mg total) by mouth 3 (three) times daily.  90 tablet  0  . morphine (MSIR) 30 MG tablet Take 1 tablet (30 mg total) by mouth every 6 (six) hours as needed. For pain  120 tablet  0  . nitroGLYCERIN (NITROSTAT) 0.4 MG SL tablet Place 0.4 mg under the tongue every 5 (five) minutes as needed for chest pain.      . sildenafil (VIAGRA) 100 MG tablet Take 1 tablet (100 mg total) by mouth as needed for erectile dysfunction.   10 tablet  6  . spironolactone (ALDACTONE) 25 MG tablet TAKE (1/2) TABLET DAILY.  15 tablet  5  . testosterone cypionate (DEPOTESTOTERONE CYPIONATE) 200 MG/ML injection Inject into the muscle every 14 (fourteen) days.        Marland Kitchen torsemide (DEMADEX) 20 MG tablet Take 1 tablet (20 mg total) by mouth daily as needed. For swelling in legs  30 tablet  6  . traZODone (DESYREL) 100 MG tablet Take 100 mg by mouth at bedtime.      Marland Kitchen zolpidem (AMBIEN) 10 MG tablet Take 10 mg by mouth at bedtime as needed. For sleep      . zoster vaccine live, PF, (ZOSTAVAX) 41660 UNT/0.65ML injection Inject 19,400 Units into the skin once.  0.65 mL  0   No current facility-administered medications on file prior to visit.    Review of Systems:  As per HPI- otherwise negative.   Physical Examination: Filed Vitals:   10/23/13 1049  BP: 120/82  Pulse: 64  Temp: 98.2 F (36.8 C)  Resp: 16   Filed Vitals:   10/23/13 1049  Height: 5' 10.5" (1.791 m)  Weight: 219 lb (99.338 kg)   Body mass index is 30.97 kg/(m^2). Ideal Body Weight: Weight in (lb) to have BMI = 25: 176.4  GEN: WDWN, NAD, Non-toxic, A & O x 3, looks well HEENT: Atraumatic, Normocephalic. Neck supple. No masses, No LAD.  Bilateral TM wnl, oropharynx normal.  PEERL,EOMI.   Poor dentition with many missing and broken teeth and redness/ inflammation of his gums Ears and Nose: No external deformity. CV: RRR, No M/G/R. No JVD. No thrill. No extra heart sounds. PULM: CTA B, no wheezes, crackles, rhonchi. No retractions. No resp. distress. No accessory muscle use. EXTR: No c/c/e NEURO Normal gait.  PSYCH: Normally interactive. Conversant. Not depressed or anxious appearing.  Calm demeanor.   [facility] reading (PRIMARY) by  Dr. Lorelei Pont. T spine: negative CXR: NAD, pacemaker in place  THORACIC SPINE - 2 VIEW  COMPARISON: None.  FINDINGS: There is a mild scoliosis deformity involving the thoracic spine. There is no evidence of thoracic spine  fracture. No other significant bone abnormalities are identified.  IMPRESSION: 1. No acute findings. 2. Mild scoliosis.  CHEST 2 VIEW  COMPARISON: March 16, 2013  FINDINGS: There is no edema or consolidation. Heart size and pulmonary vascularity are normal. Pacemaker leads are attached to the right atrium and right ventricle. No adenopathy. No bone lesions are appreciable on this study.  IMPRESSION: No edema or consolidation.  Assessment and Plan: Spine anomaly - Plan: DG Chest 2 View, DG Thoracic Spine 2 View  Dental infection - Plan: penicillin v potassium (VEETID) 500 MG tablet  Performed x-rays as per his request.  I do not see any concerning findings in his spine.  Will follow-up with reports for him Gave penicillin to use for his dental infection He will continue to follow-up with his specialists.   Signed Lamar Blinks, MD

## 2013-10-27 ENCOUNTER — Ambulatory Visit (INDEPENDENT_AMBULATORY_CARE_PROVIDER_SITE_OTHER): Payer: BC Managed Care – PPO | Admitting: Adult Health

## 2013-10-27 ENCOUNTER — Telehealth: Payer: Self-pay | Admitting: Pulmonary Disease

## 2013-10-27 ENCOUNTER — Encounter: Payer: Self-pay | Admitting: *Deleted

## 2013-10-27 VITALS — BP 108/64 | HR 79 | Temp 98.2°F | Wt 211.0 lb

## 2013-10-27 DIAGNOSIS — J441 Chronic obstructive pulmonary disease with (acute) exacerbation: Secondary | ICD-10-CM

## 2013-10-27 MED ORDER — AMOXICILLIN-POT CLAVULANATE 875-125 MG PO TABS: 1.0000 | ORAL_TABLET | Freq: Two times a day (BID) | ORAL | Status: AC

## 2013-10-27 MED ORDER — BECLOMETHASONE DIPROPIONATE 40 MCG/ACT IN AERS: 1.0000 | INHALATION_SPRAY | Freq: Two times a day (BID) | RESPIRATORY_TRACT | Status: DC

## 2013-10-27 NOTE — Telephone Encounter (Signed)
Pt returning call.Stanley A Dalton ° °

## 2013-10-27 NOTE — Patient Instructions (Addendum)
May stop Pencillin V .  Begin Augmentin 875mg  Twice daily  For 7 days -take with food.  Eat yogurt while on antibiotics.  Tylenol As needed   Mucinex DM Twice daily  As needed  As needed  Cough/congestion  Restart Qvar 40 mcg two puff twice daily Keep working on not smoking including e cig Follow up with Dr. Joya Gaskins  In 2  Months and As needed   Please contact office for sooner follow up if symptoms do not improve or worsen or seek emergency care

## 2013-10-27 NOTE — Telephone Encounter (Signed)
Spoke with pt. He is scheduled to come in and see TP this afternoon at 4:15 for acute visit. Nothing further needed

## 2013-10-27 NOTE — Telephone Encounter (Signed)
LMTC x 1  

## 2013-10-31 ENCOUNTER — Ambulatory Visit (INDEPENDENT_AMBULATORY_CARE_PROVIDER_SITE_OTHER): Payer: BC Managed Care – PPO | Admitting: Pulmonary Disease

## 2013-10-31 ENCOUNTER — Encounter: Payer: Self-pay | Admitting: Pulmonary Disease

## 2013-10-31 VITALS — BP 124/78 | HR 101 | Temp 97.7°F | Ht 72.0 in | Wt 206.6 lb

## 2013-10-31 DIAGNOSIS — G4731 Primary central sleep apnea: Secondary | ICD-10-CM

## 2013-10-31 NOTE — Patient Instructions (Signed)
Continue with your ASV device Call your medical equipment company and get another mask seal and hose if needed.  Keep up with other supplies. followup with me again in one year, but call if having issues with your sleep apnea.

## 2013-10-31 NOTE — Progress Notes (Signed)
Subjective:    Patient ID: [name], male    DOB: 1960/10/03, 52 y.o.   MRN: [id number]  HPI  53 y.o.M with known hx of OSA.    05/30/2013 Chief Complaint  Patient presents with  . 2 month follow up    c/o congestion and prod cough with yellowish brown mucus x 6 days, increased SOB, and wheezing with chills and sweats.  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  Was seen on 4/13 by PCP, cxr w/ no acute findings and rx Pen Vee K . Says some improvement but still has significant cough/congestion  Not taking QVAR .  Still smoking , mostly e cigs. Discussed smoking cessation.  No frank hemoptysis, chest pain, orthopnea, edema, rash, n/v/d.    Past Medical History  Diagnosis Date  . Chronic systolic heart failure     EF 20-25%. s/p ST. Jude ICD  . CAD (coronary artery disease)     Last cath 2/12. 3-v CAD. Failed PCI of distal RCAc  CATH [facility] 4/13 with DES to  LAD X2  . Chronic back pain     lumbar stenosis  . Benign prostatic hypertrophy   . Depression   . History of testicular cancer   . Narcotic dependence     chronic  . Benzodiazepine dependence     chronic  .  Anxiety   . CHF (congestive heart failure)   . DJD (degenerative joint disease)   . Automatic implantable cardiac defibrillator St Judes     Analyze ST     Family History  Problem Relation Age of Onset  . Heart disease Father 29    Died of MI     History   Social History  . Marital Status: Married    Spouse Name: N/A    Number of Children: N/A  . Years of Education: N/A   Occupational History  . Not on file.   Social History Main Topics  . Smoking status: Current Some Day Smoker -- 1.50 packs/day for 29 years    Types: Cigarettes    Last Attempt to Quit: 03/27/2013  . Smokeless tobacco: Never Used     Comment: PT STOPPED SMOKING CIGS 03/27/13. NOW CURRENTLY USING E-CIG  . Alcohol Use: No  . Drug Use: Yes    Special: Marijuana     Comment: Urine showed THC  . Sexual Activity: Not on file   Other Topics Concern  . Not on file   Social History Narrative  . No narrative on file     Allergies  Allergen Reactions  . Darvocet [Propoxyphene N-Acetaminophen] Anaphylaxis    Throat closes     Outpatient Prescriptions Prior to Visit  Medication Sig Dispense Refill  . alfuzosin (UROXATRAL) 10 MG 24 hr tablet as needed.       Marland Kitchen  aspirin 325 MG tablet Take 325 mg by mouth 2 (two) times daily.       . carvedilol (COREG) 25 MG tablet Take 25 mg by mouth 2 (two) times daily with a meal.      . clopidogrel (PLAVIX) 75 MG tablet Take 1 tablet (75 mg total) by mouth daily.  30 tablet  6  . CRESTOR 40 MG tablet TAKE 1 TABLET DAILY.  30 tablet  12  . diazepam (VALIUM) 10 MG tablet Take 10 mg by mouth 3 (three) times daily. For muscle spasms      . ezetimibe (ZETIA) 10 MG tablet Take 1 tablet (10 mg total) by mouth daily.  30 tablet  6  . gabapentin (NEURONTIN) 300 MG capsule 2 (two) times daily.      Marland Kitchen lactulose (CHRONULAC) 10 GM/15ML solution Take 20 g by mouth daily as needed. For constipation      . LORazepam (ATIVAN) 1 MG tablet Take 1 mg by mouth 4 (four) times daily. For  anxiety      . morphine (MS CONTIN) 100 MG 12 hr tablet Take 1 tablet (100 mg total) by mouth 3 (three) times daily.  90 tablet  0  . morphine (MSIR) 30 MG tablet Take 1 tablet (30 mg total) by mouth every 6 (six) hours as needed. For pain  120 tablet  0  . nitroGLYCERIN (NITROSTAT) 0.4 MG SL tablet Place 0.4 mg under the tongue every 5 (five) minutes as needed for chest pain.      Marland Kitchen penicillin v potassium (VEETID) 500 MG tablet Take 1 tablet (500 mg total) by mouth 2 (two) times daily.  20 tablet  0  . sildenafil (VIAGRA) 100 MG tablet Take 1 tablet (100 mg total) by mouth as needed for erectile dysfunction.  10 tablet  6  . spironolactone (ALDACTONE) 25 MG tablet TAKE (1/2) TABLET DAILY.  15 tablet  5  . testosterone cypionate (DEPOTESTOTERONE CYPIONATE) 200 MG/ML injection Inject into the muscle every 14 (fourteen) days.        Marland Kitchen torsemide (DEMADEX) 20 MG tablet Take 1 tablet (20 mg total) by mouth daily as needed. For swelling in legs  30 tablet  6  . traZODone (DESYREL) 100 MG tablet Take 100 mg by mouth at bedtime.      Marland Kitchen zolpidem (AMBIEN) 10 MG tablet Take 10 mg by mouth at bedtime as needed. For sleep      . beclomethasone (QVAR) 40 MCG/ACT inhaler Inhale 1 puff into the lungs 2 (two) times daily.  1 Inhaler  2  . lisinopril (PRINIVIL,ZESTRIL) 10 MG tablet Take 10 mg by mouth 2 (two) times daily.       Marland Kitchen zoster vaccine live, PF, (ZOSTAVAX) 46270 UNT/0.65ML injection Inject 19,400 Units into the skin once.  0.65 mL  0   No facility-administered medications prior to visit.      Review of Systems  Constitutional:   No  weight loss, night sweats,  + Fevers, chills, fatigue, lassitude. HEENT:   No headaches,  Difficulty swallowing,  Tooth/dental problems,  Sore throat,                No sneezing, itching, ear ache, + nasal congestion, post nasal drip,   CV:  No chest pain,  Orthopnea, PND, swelling in lower extremities, anasarca, dizziness, palpitations  GI  No heartburn,  indigestion, abdominal pain, nausea, vomiting, diarrhea, change in bowel habits, loss of appetite  Resp:  .  No chest wall  deformity  Skin: no rash or lesions.  GU: no dysuria, change in color of urine, no urgency or frequency.  No flank pain.  MS:  No joint pain or swelling.  No decreased range of motion.  No back pain.  Psych:  No change in mood or affect. No depression or anxiety.  No memory loss.     Objective:   Physical Exam  Filed Vitals:   10/27/13 1638  BP: 108/64  Pulse: 79  Temp: 98.2 F (36.8 C)  TempSrc: Oral  Weight: 211 lb (95.709 kg)  SpO2: 97%    Gen: Pleasant, well-nourished, in no distress,  normal affect  ENT: No lesions,  mouth clear,  oropharynx clear, no postnasal drip  Neck: No JVD, no TMG, no carotid bruits  Lungs: No use of accessory muscles, no dullness to percussion, scattered rhonchi  Cardiovascular: RRR, heart sounds normal, no murmur or gallops, no peripheral edema  Abdomen: soft and NT, no HSM,  BS normal  Musculoskeletal: No deformities, no cyanosis or clubbing  Neuro: alert, non focal  Skin: Warm, no lesions or rashes  Spirometry on 03/16/2013 shows normal spirometry with FEV1 and FVC greater than 100% per day     Assessment & Plan:   Obstructive chronic bronchitis with exacerbation Flare -slow to resolve   Plan  May stop Pencillin V .  Begin Augmentin 862m Twice daily  For 7 days -take with food.  Eat yogurt while on antibiotics.  Tylenol As needed   Mucinex DM Twice daily  As needed  As needed  Cough/congestion  Restart Qvar 40 mcg two puff twice daily Keep working on not smoking including e cig Follow up with Dr. WJoya Gaskins In 2  Months and As needed   Please contact office for sooner follow up if symptoms do not improve or worsen or seek emergency care       Updated Medication List Outpatient Encounter Prescriptions as of 10/27/2013  Medication Sig  . alfuzosin (UROXATRAL) 10 MG 24 hr tablet as needed.   .Marland Kitchen aspirin 325 MG tablet Take 325 mg by mouth 2 (two) times daily.   . beclomethasone (QVAR) 40 MCG/ACT inhaler Inhale 1 puff into the lungs 2 (two) times daily.  . carvedilol (COREG) 25 MG tablet Take 25 mg by mouth 2 (two) times daily with a meal.  . clopidogrel (PLAVIX) 75 MG tablet Take 1 tablet (75 mg total) by mouth daily.  . CRESTOR 40 MG tablet TAKE 1 TABLET DAILY.  . diazepam (VALIUM) 10 MG tablet Take 10 mg by mouth 3 (three) times daily. For muscle spasms  . ezetimibe (ZETIA) 10 MG tablet Take 1 tablet (10 mg total) by mouth daily.  .Marland Kitchengabapentin (NEURONTIN) 300 MG capsule 2 (two) times daily.  .Marland Kitchenlactulose (CHRONULAC) 10 GM/15ML solution Take 20 g by mouth daily as needed. For constipation  . LORazepam (ATIVAN) 1 MG tablet Take 1 mg by mouth 4 (four) times daily. For anxiety  . morphine (MS CONTIN) 100 MG 12 hr tablet Take 1 tablet (100 mg total) by mouth 3 (three) times daily.  .Marland Kitchenmorphine (MSIR) 30 MG tablet Take 1 tablet (30 mg total) by mouth every 6 (six) hours as needed. For pain  . nitroGLYCERIN (NITROSTAT) 0.4 MG SL tablet Place 0.4 mg under the tongue every 5 (five) minutes as needed for chest pain.  .Marland Kitchenpenicillin v potassium (VEETID) 500 MG tablet Take 1 tablet (500 mg total) by mouth 2 (two) times daily.  .Marland Kitchen  sildenafil (VIAGRA) 100 MG tablet Take 1 tablet (100 mg total) by mouth as needed for erectile dysfunction.  Marland Kitchen spironolactone (ALDACTONE) 25 MG tablet TAKE (1/2) TABLET DAILY.  Marland Kitchen testosterone cypionate (DEPOTESTOTERONE CYPIONATE) 200 MG/ML injection Inject into the muscle every 14 (fourteen) days.    Marland Kitchen torsemide (DEMADEX) 20 MG tablet Take 1 tablet (20 mg total) by mouth daily as needed. For swelling in legs  . traZODone (DESYREL) 100 MG tablet Take 100 mg by mouth at bedtime.  Marland Kitchen zolpidem (AMBIEN) 10 MG tablet Take 10 mg by mouth at bedtime as needed. For sleep  . [DISCONTINUED] beclomethasone (QVAR) 40 MCG/ACT inhaler Inhale 1 puff into the lungs 2 (two) times daily.  .  [DISCONTINUED] lisinopril (PRINIVIL,ZESTRIL) 10 MG tablet Take 10 mg by mouth 2 (two) times daily.   Marland Kitchen amoxicillin-clavulanate (AUGMENTIN) 875-125 MG per tablet Take 1 tablet by mouth 2 (two) times daily.  . [DISCONTINUED] zoster vaccine live, PF, (ZOSTAVAX) 12787 UNT/0.65ML injection Inject 19,400 Units into the skin once.

## 2013-10-31 NOTE — Progress Notes (Deleted)
   Subjective:    Patient ID: [name], male    DOB: 1961/03/25, 53 y.o.   MRN: [id number]  HPI    Review of Systems  Constitutional: Negative for fever, chills, diaphoresis, activity change, appetite change, fatigue and unexpected weight change.  HENT: Negative for congestion, dental problem, ear discharge, ear pain, facial swelling, hearing loss, mouth sores, nosebleeds, postnasal drip, rhinorrhea, sinus pressure, sneezing, sore throat, tinnitus, trouble swallowing and voice change.   Eyes: Negative for photophobia, discharge, itching and visual disturbance.  Respiratory: Negative for apnea, cough, choking, chest tightness, shortness of breath, wheezing and stridor.   Cardiovascular: Negative for chest pain, palpitations and leg swelling.  Gastrointestinal: Negative for nausea, vomiting, abdominal pain, constipation, blood in stool and abdominal distention.  Genitourinary: Negative for dysuria, urgency, frequency, hematuria, flank pain, decreased urine volume and difficulty urinating.  Musculoskeletal: Negative for arthralgias, back pain, gait problem, joint swelling, myalgias, neck pain and neck stiffness.  Skin: Negative for color change, pallor and rash.  Neurological: Negative for dizziness, tremors, seizures, syncope, speech difficulty, weakness, light-headedness, numbness and headaches.  Hematological: Negative for adenopathy. Does not bruise/bleed easily.  Psychiatric/Behavioral: Negative for confusion, sleep disturbance and agitation. The patient is not nervous/anxious.        Objective:   Physical Exam        Assessment & Plan:

## 2013-10-31 NOTE — Assessment & Plan Note (Signed)
Flare -slow to resolve   Plan  May stop Pencillin V .  Begin Augmentin 875mg  Twice daily  For 7 days -take with food.  Eat yogurt while on antibiotics.  Tylenol As needed   Mucinex DM Twice daily  As needed  As needed  Cough/congestion  Restart Qvar 40 mcg two puff twice daily Keep working on not smoking including e cig Follow up with Dr. Joya Gaskins  In 2  Months and As needed   Please contact office for sooner follow up if symptoms do not improve or worsen or seek emergency care

## 2013-10-31 NOTE — Assessment & Plan Note (Signed)
The patient feels that he is doing well with his ASV device, but is in need of a new mask and possibly a new hose. I've asked him to continue on his current setting, and to keep up with his mask changes and supplies.

## 2013-10-31 NOTE — Progress Notes (Signed)
   Subjective:    Patient ID: [name], male    DOB: 07-Oct-1960, 53 y.o.   MRN: [id number]  HPI The patient comes in today for followup of his known complex sleep apnea. He has been on an ASV device with good compliance, and feels that he has done well. It has definitely helped his sleep efficiency in his daytime alertness, but he continues to only gets 5 hours of sleep a night for various reasons. His mask fits well, but is in need of replacement.   Review of Systems  Constitutional: Positive for appetite change, fatigue and unexpected weight change. Negative for fever.  HENT: Negative for congestion, dental problem, ear pain, nosebleeds, postnasal drip, rhinorrhea, sinus pressure, sneezing, sore throat and trouble swallowing.   Eyes: Negative for redness and itching.  Respiratory: Negative for cough, chest tightness, shortness of breath and wheezing.   Cardiovascular: Negative for palpitations and leg swelling.  Gastrointestinal: Negative for nausea and vomiting.  Genitourinary: Negative for dysuria.  Musculoskeletal: Negative for joint swelling.  Skin: Negative for rash.  Neurological: Negative for headaches.  Hematological: Does not bruise/bleed easily.  Psychiatric/Behavioral: Positive for dysphoric mood ( father passed 10/24/13). The patient is not nervous/anxious.        Objective:   Physical Exam Well-developed male in no acute distress Nose without purulence or discharge noted No skin breakdown or pressure necrosis from the CPAP mask Neck without lymphadenopathy or thyromegaly Lower extremities with mild edema, no cyanosis Alert and oriented, moves all 4 extremities.       Assessment & Plan:

## 2013-11-01 ENCOUNTER — Telehealth: Payer: Self-pay | Admitting: Pulmonary Disease

## 2013-11-01 NOTE — Telephone Encounter (Signed)
Please let pt know that his download shows excellent control of his apnea with his device during the time that he is wearing.  Needs to work on increasing total sleep time.

## 2013-11-02 NOTE — Telephone Encounter (Signed)
Pt returned Ashtyn's call.

## 2013-11-02 NOTE — Telephone Encounter (Signed)
ATC x1 Busy signal--home # LMOM x 1 mobile #

## 2013-11-02 NOTE — Telephone Encounter (Signed)
Called and spoke with pt and he is aware of results per [facility].  Pt voiced his understanding of these results.

## 2013-11-07 ENCOUNTER — Telehealth: Payer: Self-pay

## 2013-11-07 MED ORDER — MORPHINE SULFATE 30 MG PO TABS: 30.0000 mg | ORAL_TABLET | Freq: Four times a day (QID) | ORAL | Status: DC | PRN

## 2013-11-07 MED ORDER — MORPHINE SULFATE ER 100 MG PO TBCR: 100.0000 mg | EXTENDED_RELEASE_TABLET | Freq: Three times a day (TID) | ORAL | Status: DC

## 2013-11-07 NOTE — Telephone Encounter (Signed)
Refill for Morphine 100 mg and Morphine 30 mg printed for Dr. Letta Pate to sign. Will contact patient when ready for pickup.

## 2013-11-07 NOTE — Telephone Encounter (Signed)
Patient appointment had to be rescheduled due to Dr Aretta Nip schedule change.  Patient is not able to get back in the office before he will be out of medication.  Please refill rx and let patient know when it is ready for pick up.

## 2013-11-08 DIAGNOSIS — R634 Abnormal weight loss: Secondary | ICD-10-CM | POA: Insufficient documentation

## 2013-11-08 LAB — PULMONARY FUNCTION TEST

## 2013-11-09 NOTE — Telephone Encounter (Signed)
Contacted patient to inform him that his RX are ready for pickup.

## 2013-11-13 ENCOUNTER — Ambulatory Visit: Payer: BC Managed Care – PPO | Admitting: Physical Medicine & Rehabilitation

## 2013-12-05 ENCOUNTER — Ambulatory Visit (INDEPENDENT_AMBULATORY_CARE_PROVIDER_SITE_OTHER): Payer: BC Managed Care – PPO | Admitting: *Deleted

## 2013-12-05 DIAGNOSIS — I429 Cardiomyopathy, unspecified: Secondary | ICD-10-CM

## 2013-12-05 DIAGNOSIS — I428 Other cardiomyopathies: Secondary | ICD-10-CM

## 2013-12-05 NOTE — Progress Notes (Signed)
Remote ICD transmission.   

## 2013-12-11 ENCOUNTER — Telehealth: Payer: Self-pay

## 2013-12-11 MED ORDER — MORPHINE SULFATE 30 MG PO TABS: 30.0000 mg | ORAL_TABLET | Freq: Four times a day (QID) | ORAL | Status: DC | PRN

## 2013-12-11 MED ORDER — MORPHINE SULFATE ER 100 MG PO TBCR: 100.0000 mg | EXTENDED_RELEASE_TABLET | Freq: Three times a day (TID) | ORAL | Status: DC

## 2013-12-11 NOTE — Telephone Encounter (Signed)
Has an appt with Kirsteins 12/12/13.  Has not seen MD since 08/2012 so appt not switched to NP.  Printed for Kirsteins to sign.  Notified Mr Laduke rx will be ready to pick up this morning.

## 2013-12-11 NOTE — Telephone Encounter (Signed)
Patient called requesting morphine refill.   He will be out before his next appointment.

## 2013-12-12 ENCOUNTER — Encounter: Payer: BC Managed Care – PPO | Attending: Physical Medicine & Rehabilitation

## 2013-12-12 ENCOUNTER — Encounter: Payer: Self-pay | Admitting: Physical Medicine & Rehabilitation

## 2013-12-12 ENCOUNTER — Ambulatory Visit (HOSPITAL_BASED_OUTPATIENT_CLINIC_OR_DEPARTMENT_OTHER): Payer: BC Managed Care – PPO | Admitting: Physical Medicine & Rehabilitation

## 2013-12-12 VITALS — BP 111/73 | HR 62 | Resp 14 | Ht 72.0 in | Wt 222.0 lb

## 2013-12-12 DIAGNOSIS — R269 Unspecified abnormalities of gait and mobility: Secondary | ICD-10-CM | POA: Insufficient documentation

## 2013-12-12 DIAGNOSIS — G4737 Central sleep apnea in conditions classified elsewhere: Secondary | ICD-10-CM | POA: Insufficient documentation

## 2013-12-12 DIAGNOSIS — I2510 Atherosclerotic heart disease of native coronary artery without angina pectoris: Secondary | ICD-10-CM | POA: Insufficient documentation

## 2013-12-12 DIAGNOSIS — I5022 Chronic systolic (congestive) heart failure: Secondary | ICD-10-CM | POA: Insufficient documentation

## 2013-12-12 DIAGNOSIS — M48062 Spinal stenosis, lumbar region with neurogenic claudication: Secondary | ICD-10-CM

## 2013-12-12 DIAGNOSIS — G8929 Other chronic pain: Secondary | ICD-10-CM | POA: Insufficient documentation

## 2013-12-12 DIAGNOSIS — Z95810 Presence of automatic (implantable) cardiac defibrillator: Secondary | ICD-10-CM | POA: Insufficient documentation

## 2013-12-12 DIAGNOSIS — Z9181 History of falling: Secondary | ICD-10-CM | POA: Insufficient documentation

## 2013-12-12 DIAGNOSIS — F1120 Opioid dependence, uncomplicated: Secondary | ICD-10-CM

## 2013-12-12 DIAGNOSIS — F1920 Other psychoactive substance dependence, uncomplicated: Secondary | ICD-10-CM

## 2013-12-12 DIAGNOSIS — I509 Heart failure, unspecified: Secondary | ICD-10-CM | POA: Insufficient documentation

## 2013-12-12 DIAGNOSIS — F329 Major depressive disorder, single episode, unspecified: Secondary | ICD-10-CM | POA: Insufficient documentation

## 2013-12-12 DIAGNOSIS — G4733 Obstructive sleep apnea (adult) (pediatric): Secondary | ICD-10-CM | POA: Insufficient documentation

## 2013-12-12 DIAGNOSIS — F3289 Other specified depressive episodes: Secondary | ICD-10-CM | POA: Insufficient documentation

## 2013-12-12 MED ORDER — MORPHINE SULFATE 30 MG PO TABS: 30.0000 mg | ORAL_TABLET | Freq: Four times a day (QID) | ORAL | Status: DC | PRN

## 2013-12-12 MED ORDER — MORPHINE SULFATE ER 100 MG PO TBCR: 100.0000 mg | EXTENDED_RELEASE_TABLET | Freq: Three times a day (TID) | ORAL | Status: DC

## 2013-12-12 NOTE — Progress Notes (Signed)
Subjective:    Patient ID: [name], male    DOB: February 02, 1961, 53 y.o.   MRN: [id number]  HPI Using Avi mask for central obstructive sleep apnea for the last month sleeping better Lost ~50 lb  Sees Hematology, Pulmonary, Cardiology at [facility] Pulmonary consult due [facility] for 04/01/2012. Diagnosis of central and obstructive sleep apnea. Combination between obesity as well as chronic high dose narcotic use. Now has several CPAP machines Discussed low T , since testicular CA, also discussed effect of chronic hi dose morphine on T levels  [facility] pre-cath procedure note place patient as ASA class 2 risk Had repeat cardiac cath at [facility] in march 2015, not much change compared to prior cath in 2013 Coronary arteries: Left main: Normal Left anterior descending: Mild irregularity in the proximal LAD and nicely patent stent in the mid LAD after D-2 Left circumflex: Occluded OM-1 with filling from left collaterals. Right coronary: Dominant vessel with ectasia in the mid-proximal segment. Proximal 30% narrowing   Patient relates increased stress secondary to stepfather recently passed away.  0 Falls Ambulating distance has improved. He states he walked up to 1 mile with his daughter last winter in the snow using his walker He is no longer smoking cigarettes  He is using [employer] and is down to the lowest concentration nicotine liquid  Pain Inventory Average Pain 8 Pain Right Now 8 My pain is sharp, burning, stabbing and numbness  In the last 24 hours, has pain interfered with the following? General activity 7 Relation with others 9 Enjoyment of life 7 What TIME of day is your pain at its worst? daytime, evening, night Sleep (in general) Fair  Pain is worse with: walking, bending and standing Pain improves with: rest, therapy/exercise and medication Relief from Meds: 7  Mobility use a walker ability to climb steps?  yes do you drive?  yes  Function employed # of  hrs/week 30 I need assistance with the following:  dressing, meal prep, household duties and shopping  Neuro/Psych numbness trouble walking anxiety  Prior Studies Any changes since last visit?  no  Physicians involved in your care Any changes since last visit?  no   Family History  Problem Relation Age of Onset  . Heart disease Father 33    Died of MI   History   Social History  . Marital Status: Married    Spouse Name: N/A    Number of Children: N/A  . Years of Education: N/A   Social History Main Topics  . Smoking status: Current Some Day Smoker -- 1.50 packs/day for 29 years    Types: Cigarettes    Last Attempt to Quit: 03/27/2013  . Smokeless tobacco: Never Used     Comment: PT STOPPED SMOKING CIGS 03/27/13. NOW CURRENTLY USING E-CIG  . Alcohol Use: No  . Drug Use: Yes    Special: Marijuana     Comment: Urine showed THC  . Sexual Activity: None   Other Topics Concern  . None   Social History Narrative  . None   Past Surgical History  Procedure Laterality Date  . Hernia repair    . Asd repair, sinus venosus    . Testicle surgery      testicular cancer surgery  . Cardiac defibrillator placement  08/2010   Past Medical History  Diagnosis Date  . Chronic systolic heart failure     EF 20-25%. s/p ST. Jude ICD  . CAD (coronary artery disease)  Last cath 2/12. 3-v CAD. Failed PCI of distal RCAc  CATH [facility] 4/13 with DES to  LAD X2  . Chronic back pain     lumbar stenosis  . Benign prostatic hypertrophy   . Depression   . History of testicular cancer   . Narcotic dependence     chronic  . Benzodiazepine dependence     chronic  . Anxiety   . CHF (congestive heart failure)   . DJD (degenerative joint disease)   . Automatic implantable cardiac defibrillator [place]     Analyze ST   BP 111/73  Pulse 62  Resp 14  Ht 6' (1.829 m)  Wt 222 lb (100.699 kg)  BMI 30.10 kg/m2  SpO2 98%  Opioid Risk Score:   Fall Risk Score: Moderate Fall Risk (6-13  points) (patient educated hanodut declined)   Review of Systems  Constitutional: Positive for unexpected weight change.  Respiratory: Positive for apnea and shortness of breath.   Musculoskeletal: Positive for gait problem.  Neurological: Positive for numbness.  Hematological: Bruises/bleeds easily.  Psychiatric/Behavioral: The patient is nervous/anxious.   All other systems reviewed and are negative.      Objective:   Physical Exam  Nursing note and vitals reviewed. Constitutional: He is oriented to person, place, and time. He appears well-developed and well-nourished.  HENT:  Head: Normocephalic and atraumatic.  Eyes: Conjunctivae and EOM are normal. Pupils are equal, round, and reactive to light.  Neck: Normal range of motion.  Musculoskeletal:       Thoracic back: He exhibits normal range of motion, no tenderness and no deformity.       Lumbar back: He exhibits decreased range of motion. He exhibits no tenderness.  Neurological: He is alert and oriented to person, place, and time. He has normal reflexes.  Psychiatric: He has a normal mood and affect.   head forward posture, full cervical range of motion, no radicular pain with foraminal compression Responses are mildly slowed His medical history is fair when compared to actual physician notes    Assessment & Plan:  #1. Lumbar spinal stenosis, severe who has been managed with chronic narcotic analgesic medications. He has been regularly monitored with urine drug screen's as well as pill counts. No signs of aberrant drug behavior. Remote history of THC He ran out of his morphine a couple days before this visit which was not because he took too many meds. He did have withdrawal reaction mainly diarrhea  2. History of shoulder pain left side. Patient thought it was part related that his cardiologist told him not. This occurs after he sleeps. He does sleep with a head for posture so I think this is most likely related to radiating  pain from the cervical spine. We discussed cervical imaging however he would like to hold off on this.  He has made significant improvements in terms of his mobility after losing 50 pounds. His sleep apnea is also improved and also was more compliant with CPAP  I think now would be a good time for him to get cardiac rehabilitation. A wanted him to get cardiac clearance from his cardiologist at [facility] which he states he would do  Consider an evaluation by the cardiology in terms of surgical risk for lumbar decompression surgery. His ejection fraction has improved compared to previous   Return to clinic one month with nurse practitioner M.D. visit in 3 months  Over half of the 25 min visit was spent counseling and coordinating  care.

## 2013-12-12 NOTE — Patient Instructions (Signed)
Rec:  Cardiac rehab if ok with cardiology

## 2013-12-13 ENCOUNTER — Encounter: Payer: Self-pay | Admitting: Internal Medicine

## 2013-12-13 LAB — MDC_IDC_ENUM_SESS_TYPE_REMOTE
Brady Statistic RA Percent Paced: 1 %
Brady Statistic RV Percent Paced: 1 %
Implantable Pulse Generator Serial Number: 815096
Lead Channel Pacing Threshold Amplitude: 0.5 V
Lead Channel Pacing Threshold Pulse Width: 0.5 ms
Lead Channel Sensing Intrinsic Amplitude: 12 mV
Lead Channel Sensing Intrinsic Amplitude: 3.9 mV
Lead Channel Setting Pacing Amplitude: 2 V
Lead Channel Setting Pacing Amplitude: 3 V
Lead Channel Setting Pacing Pulse Width: 0.5 ms
Lead Channel Setting Sensing Sensitivity: 0.5 mV
Zone Setting Detection Interval: 270 ms
Zone Setting Detection Interval: 340 ms
Zone Setting Detection Interval: 400 ms

## 2013-12-18 ENCOUNTER — Other Ambulatory Visit (HOSPITAL_COMMUNITY): Payer: Self-pay | Admitting: *Deleted

## 2013-12-18 MED ORDER — SILDENAFIL CITRATE 100 MG PO TABS: 100.0000 mg | ORAL_TABLET | ORAL | Status: DC | PRN

## 2013-12-21 ENCOUNTER — Other Ambulatory Visit: Payer: Self-pay | Admitting: Internal Medicine

## 2014-01-02 ENCOUNTER — Encounter (HOSPITAL_BASED_OUTPATIENT_CLINIC_OR_DEPARTMENT_OTHER): Payer: BC Managed Care – PPO | Admitting: Registered Nurse

## 2014-01-02 ENCOUNTER — Encounter: Payer: Self-pay | Admitting: Registered Nurse

## 2014-01-02 ENCOUNTER — Other Ambulatory Visit: Payer: Self-pay

## 2014-01-02 VITALS — BP 135/75 | HR 82 | Resp 14 | Ht 72.0 in | Wt 208.0 lb

## 2014-01-02 DIAGNOSIS — Z5181 Encounter for therapeutic drug level monitoring: Secondary | ICD-10-CM

## 2014-01-02 DIAGNOSIS — F1120 Opioid dependence, uncomplicated: Secondary | ICD-10-CM

## 2014-01-02 DIAGNOSIS — M48062 Spinal stenosis, lumbar region with neurogenic claudication: Secondary | ICD-10-CM

## 2014-01-02 DIAGNOSIS — F1920 Other psychoactive substance dependence, uncomplicated: Secondary | ICD-10-CM

## 2014-01-02 DIAGNOSIS — Z79899 Other long term (current) drug therapy: Secondary | ICD-10-CM

## 2014-01-02 MED ORDER — MORPHINE SULFATE ER 100 MG PO TBCR: 100.0000 mg | EXTENDED_RELEASE_TABLET | Freq: Three times a day (TID) | ORAL | Status: DC

## 2014-01-02 MED ORDER — MORPHINE SULFATE 30 MG PO TABS
30.0000 mg | ORAL_TABLET | Freq: Four times a day (QID) | ORAL | Status: DC | PRN
Start: 2014-01-02 — End: 2014-01-31

## 2014-01-02 NOTE — Progress Notes (Signed)
Subjective:    Patient ID: [name], male    DOB: 1961/04/04, 53 y.o.   MRN: [id number]  HPI: [name] is a 53 year old male who returns for follow up for chronic pain and medication refill. He says his pain is located in his neck, shoulder and back. He rates his pain 8. His current exercise regime is is using his recumbeant bicycle and walking. He says he is being evaluated at [facility] for weight loss of 50lbs, they are seeing what is causing the weight loss. 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 Admits to using his CPAP machine nightly. Pain Inventory Average Pain 8 Pain Right Now 8 My pain is sharp, burning and tingling  In the last 24 hours, has pain interfered with the following? General activity 8 Relation with others 9 Enjoyment of life 9 What TIME of day is your pain at its worst? morning, daytime, evening Sleep (in general) Fair  Pain is worse with: some activites Pain improves with: rest and medication Relief from Meds: 8  Mobility walk with assistance use a cane use a walker how many minutes can you walk? depends on pain level ability to climb steps?  yes do you drive?  yes transfers alone  Function employed # of hrs/week na I need assistance with the following:  meal prep, household duties and shopping  Neuro/Psych numbness tingling trouble walking anxiety  Prior Studies Any changes since last visit?  no  Physicians involved in your care Any changes since last visit?  no   Family History  Problem Relation Age of Onset  . Heart disease Father 27    Died of MI   History   Social History  . Marital Status: Married    Spouse Name: N/A    Number of Children: N/A  . Years of Education: N/A   Social History Main Topics  . Smoking status: Current Some Day Smoker -- 1.50 packs/day for 29 years    Types: Cigarettes    Last Attempt to Quit: 03/27/2013  . Smokeless tobacco: Never Used     Comment: PT  STOPPED SMOKING CIGS 03/27/13. NOW CURRENTLY USING E-CIG  . Alcohol Use: No  . Drug Use: Yes    Special: Marijuana     Comment: Urine showed THC  . Sexual Activity: None   Other Topics Concern  . None   Social History Narrative  . None   Past Surgical History  Procedure Laterality Date  . Hernia repair    . Asd repair, sinus venosus    . Testicle surgery      testicular cancer surgery  . Cardiac defibrillator placement  08/2010   Past Medical History  Diagnosis Date  . Chronic systolic heart failure     EF 20-25%. s/p ST. Jude ICD  . CAD (coronary artery disease)     Last cath 2/12. 3-v CAD. Failed PCI of distal RCAc  CATH [facility] 4/13 with DES to  LAD X2  . Chronic back pain     lumbar stenosis  . Benign prostatic hypertrophy   . Depression   . History of testicular cancer   . Narcotic dependence     chronic  . Benzodiazepine dependence     chronic  . Anxiety   . CHF (congestive heart failure)   . DJD (degenerative joint disease)   . Automatic implantable cardiac defibrillator [place]  BP 135/75  Pulse 82  Resp 14  Ht 6' (1.829 m)  Wt 208 lb (94.348 kg)  BMI 28.20 kg/m2  SpO2 99%  Opioid Risk Score:   Fall Risk Score: High Fall Risk (>13 points) (pt educated on fall risk, brochure given to pt previously)    Review of Systems  Constitutional: Positive for unexpected weight change.  Respiratory: Positive for apnea.        Respiratory infections  Cardiovascular: Positive for leg swelling.  Gastrointestinal: Positive for constipation.  Musculoskeletal: Positive for back pain and gait problem.  Neurological: Positive for numbness.       Tingling  Hematological: Bruises/bleeds easily.  Psychiatric/Behavioral: The patient is nervous/anxious.   All other systems reviewed and are negative.      Objective:   Physical Exam  Nursing note and vitals reviewed. Constitutional: He is oriented to person, place, and time. He appears well-developed  and well-nourished.  HENT:  Head: Normocephalic and atraumatic.  Neck: Normal range of motion. Neck supple.  Cardiovascular: Normal rate, regular rhythm and normal heart sounds.   Pulmonary/Chest: Effort normal and breath sounds normal.  Musculoskeletal:  Normal Muscle Bulk and Muscle Testing Reveals:  Upper Extremities: Full ROM and Muscle Strength 5/5 Thoracic and Lumbar Hypersensitivity  Arises from chair with ease Narrow Based Gait  Neurological: He is alert and oriented to person, place, and time.  Skin: Skin is warm and dry.  Psychiatric: He has a normal mood and affect.          Assessment & Plan:  1. Lumbar spinal stenosis, severe.  Refilled: MS Contin 100 mg one tablet three times a day #90 and MSIR 30 mg one tablet every 6 hours as needed for sever pain #120. 2. History of shoulder pain left side. Controlled. No complaints today. 3. Sleep apnea : compliant with CPAP   30 minutes of face to face patient care time was spent during this visit. All questions were encouraged and answered.  F/U in 1 month

## 2014-01-03 ENCOUNTER — Telehealth: Payer: Self-pay | Admitting: *Deleted

## 2014-01-03 NOTE — Telephone Encounter (Signed)
Mr Sedano called and says he is going to be leaving for [facility] on Friday 01/05/14 and will need his refill before he gets back into town.  I have called [employer] and ok'd the early refill this time under these circumstances. Pharmacy agrees to fill and Mr Bewley notified.

## 2014-01-09 ENCOUNTER — Encounter: Payer: Self-pay | Admitting: Cardiology

## 2014-01-10 ENCOUNTER — Encounter: Payer: Self-pay | Admitting: Cardiology

## 2014-01-15 ENCOUNTER — Encounter: Payer: Self-pay | Admitting: Cardiology

## 2014-01-31 ENCOUNTER — Encounter: Payer: BC Managed Care – PPO | Attending: Physical Medicine & Rehabilitation | Admitting: Registered Nurse

## 2014-01-31 ENCOUNTER — Encounter: Payer: Self-pay | Admitting: Registered Nurse

## 2014-01-31 VITALS — BP 147/79 | HR 85 | Resp 16 | Ht 72.0 in | Wt 215.0 lb

## 2014-01-31 DIAGNOSIS — G8929 Other chronic pain: Secondary | ICD-10-CM | POA: Insufficient documentation

## 2014-01-31 DIAGNOSIS — G4737 Central sleep apnea in conditions classified elsewhere: Secondary | ICD-10-CM | POA: Insufficient documentation

## 2014-01-31 DIAGNOSIS — Z9181 History of falling: Secondary | ICD-10-CM | POA: Insufficient documentation

## 2014-01-31 DIAGNOSIS — I509 Heart failure, unspecified: Secondary | ICD-10-CM | POA: Insufficient documentation

## 2014-01-31 DIAGNOSIS — I5022 Chronic systolic (congestive) heart failure: Secondary | ICD-10-CM | POA: Insufficient documentation

## 2014-01-31 DIAGNOSIS — F3289 Other specified depressive episodes: Secondary | ICD-10-CM | POA: Insufficient documentation

## 2014-01-31 DIAGNOSIS — F1920 Other psychoactive substance dependence, uncomplicated: Secondary | ICD-10-CM

## 2014-01-31 DIAGNOSIS — Z5181 Encounter for therapeutic drug level monitoring: Secondary | ICD-10-CM

## 2014-01-31 DIAGNOSIS — Z79899 Other long term (current) drug therapy: Secondary | ICD-10-CM

## 2014-01-31 DIAGNOSIS — F329 Major depressive disorder, single episode, unspecified: Secondary | ICD-10-CM | POA: Insufficient documentation

## 2014-01-31 DIAGNOSIS — F1120 Opioid dependence, uncomplicated: Secondary | ICD-10-CM

## 2014-01-31 DIAGNOSIS — G4733 Obstructive sleep apnea (adult) (pediatric): Secondary | ICD-10-CM | POA: Insufficient documentation

## 2014-01-31 DIAGNOSIS — R269 Unspecified abnormalities of gait and mobility: Secondary | ICD-10-CM | POA: Insufficient documentation

## 2014-01-31 DIAGNOSIS — M48062 Spinal stenosis, lumbar region with neurogenic claudication: Secondary | ICD-10-CM

## 2014-01-31 DIAGNOSIS — I2510 Atherosclerotic heart disease of native coronary artery without angina pectoris: Secondary | ICD-10-CM | POA: Insufficient documentation

## 2014-01-31 DIAGNOSIS — Z95810 Presence of automatic (implantable) cardiac defibrillator: Secondary | ICD-10-CM | POA: Insufficient documentation

## 2014-01-31 MED ORDER — MORPHINE SULFATE 30 MG PO TABS: 30.0000 mg | ORAL_TABLET | Freq: Four times a day (QID) | ORAL | Status: DC | PRN

## 2014-01-31 MED ORDER — MORPHINE SULFATE ER 100 MG PO TBCR: 100.0000 mg | EXTENDED_RELEASE_TABLET | Freq: Three times a day (TID) | ORAL | Status: DC

## 2014-01-31 NOTE — Progress Notes (Signed)
Subjective:    Patient ID: [name], male    DOB: 1960/09/14, 53 y.o.   MRN: [id number]  HPI: [name] is a 53 year old male who returns for follow up for chronic pain and medication refill. He says his pain is located in his lower-back and bilateral lower extremities. He rates his pain 8. His current exercise regime is is using his recumbeant bicycle and walking.  He has been following up with [facility] hematologist, cardiologist and pulmonologist. He arrived to office using his cadillac walker.  Pain Inventory Average Pain 8 Pain Right Now 8 My pain is sharp, burning, stabbing and aching  In the last 24 hours, has pain interfered with the following? General activity 4 Relation with others 6 Enjoyment of life 4 What TIME of day is your pain at its worst? day and night Sleep (in general) Fair  Pain is worse with: walking, bending, standing and some activites Pain improves with: medication Relief from Meds: 5  Mobility use a walker do you drive?  yes  Function employed # of hrs/week 20 I need assistance with the following:  dressing, meal prep, household duties and shopping Do you have any goals in this area?  yes  Neuro/Psych numbness tingling trouble walking anxiety  Prior Studies Any changes since last visit?  yes  Physicians involved in your care Primary care . Psychiatrist .   Family History  Problem Relation Age of Onset  . Heart disease Father 59    Died of MI   History   Social History  . Marital Status: Married    Spouse Name: N/A    Number of Children: N/A  . Years of Education: N/A   Social History Main Topics  . Smoking status: Current Some Day Smoker -- 1.50 packs/day for 29 years    Types: Cigarettes    Last Attempt to Quit: 03/27/2013  . Smokeless tobacco: Never Used     Comment: PT STOPPED SMOKING CIGS 03/27/13. NOW CURRENTLY USING E-CIG  . Alcohol Use: No  . Drug Use: Yes    Special: Marijuana     Comment: Urine showed THC   . Sexual Activity: None   Other Topics Concern  . None   Social History Narrative  . None   Past Surgical History  Procedure Laterality Date  . Hernia repair    . Asd repair, sinus venosus    . Testicle surgery      testicular cancer surgery  . Cardiac defibrillator placement  08/2010   Past Medical History  Diagnosis Date  . Chronic systolic heart failure     EF 20-25%. s/p ST. Jude ICD  . CAD (coronary artery disease)     Last cath 2/12. 3-v CAD. Failed PCI of distal RCAc  CATH [facility] 4/13 with DES to  LAD X2  . Chronic back pain     lumbar stenosis  . Benign prostatic hypertrophy   . Depression   . History of testicular cancer   . Narcotic dependence     chronic  . Benzodiazepine dependence     chronic  . Anxiety   . CHF (congestive heart failure)   . DJD (degenerative joint disease)   . Automatic implantable cardiac defibrillator [employer]     Analyze ST   BP 147/79  Pulse 85  Resp 16  Ht 6' (1.829 m)  Wt 215 lb (97.523 kg)  BMI 29.15 kg/m2  SpO2 96%  Opioid Risk Score:  Fall Risk Score: Moderate Fall Risk (6-13 points) (patient educated handout declined)   Review of Systems  Musculoskeletal: Positive for back pain and gait problem.  Neurological: Positive for numbness.       Tingling  Psychiatric/Behavioral: The patient is nervous/anxious.   All other systems reviewed and are negative.      Objective:   Physical Exam  Nursing note and vitals reviewed. Constitutional: He is oriented to person, place, and time. He appears well-developed and well-nourished.  HENT:  Head: Normocephalic and atraumatic.  Neck: Normal range of motion. Neck supple.  Cardiovascular: Normal rate and regular rhythm.   Pulmonary/Chest: Effort normal and breath sounds normal.  Musculoskeletal:  Normal Muscle Bulk and Muscle Testing Reveals:  Upper Extremities: Full ROM and Muscle strength 5/5 Spinal Forward Flexion: 45 Degrees and Extension 10 Degrees Lumbar Paraspinal  Tenderness: L-3- L-5 Arises from chair with ease Narrow based Gait Using Cadillac Walker  Neurological: He is alert and oriented to person, place, and time.  Skin: Skin is warm and dry.  Psychiatric: He has a normal mood and affect.          Assessment & Plan:  1. Lumbar spinal stenosis, severe.  Refilled: MS Contin 100 mg one tablet three times a day #90 and MSIR 30 mg one tablet every 6 hours as needed for sever pain #120.  2. History of shoulder pain left side. Controlled. No complaints today.  3. Sleep apnea : compliant with CPAP   20 minutes of face to face patient care time was spent during this visit. All questions were encouraged and answered.   F/U in 1 month

## 2014-02-07 ENCOUNTER — Other Ambulatory Visit (HOSPITAL_COMMUNITY): Payer: Self-pay | Admitting: Internal Medicine

## 2014-02-12 ENCOUNTER — Ambulatory Visit (INDEPENDENT_AMBULATORY_CARE_PROVIDER_SITE_OTHER): Payer: BC Managed Care – PPO | Admitting: *Deleted

## 2014-02-12 DIAGNOSIS — I428 Other cardiomyopathies: Secondary | ICD-10-CM

## 2014-02-12 LAB — MDC_IDC_ENUM_SESS_TYPE_INCLINIC
Battery Remaining Longevity: 74.4 mo
Brady Statistic RA Percent Paced: 0.14 %
Brady Statistic RV Percent Paced: 0.23 %
Date Time Interrogation Session: 20150803122211
HighPow Impedance: 74.25 Ohm
Implantable Pulse Generator Serial Number: 815096
Lead Channel Impedance Value: 312.5 Ohm
Lead Channel Impedance Value: 450 Ohm
Lead Channel Pacing Threshold Amplitude: 0.5 V
Lead Channel Pacing Threshold Amplitude: 2 V
Lead Channel Pacing Threshold Pulse Width: 0.5 ms
Lead Channel Pacing Threshold Pulse Width: 0.5 ms
Lead Channel Sensing Intrinsic Amplitude: 12 mV
Lead Channel Sensing Intrinsic Amplitude: 3.9 mV
Lead Channel Setting Pacing Amplitude: 2 V
Lead Channel Setting Pacing Amplitude: 3 V
Lead Channel Setting Pacing Pulse Width: 0.5 ms
Lead Channel Setting Sensing Sensitivity: 0.5 mV
Zone Setting Detection Interval: 270 ms
Zone Setting Detection Interval: 340 ms
Zone Setting Detection Interval: 400 ms

## 2014-02-12 NOTE — Progress Notes (Signed)
ICD check in clinic. Normal device function. Thresholds and sensing consistent with previous device measurements. Impedance trends stable over time. No evidence of any ventricular arrhythmias. No mode switches. Histogram distribution appropriate for patient and level of activity. No changes made this session. Device programmed at appropriate safety margins. Device programmed to optimize intrinsic conduction. Estimated longevity 4.8 to 6.2 years. ROV in 6 mths w/SK.

## 2014-02-16 ENCOUNTER — Encounter: Payer: Self-pay | Admitting: Critical Care Medicine

## 2014-02-16 ENCOUNTER — Other Ambulatory Visit (INDEPENDENT_AMBULATORY_CARE_PROVIDER_SITE_OTHER): Payer: BC Managed Care – PPO

## 2014-02-16 ENCOUNTER — Ambulatory Visit (INDEPENDENT_AMBULATORY_CARE_PROVIDER_SITE_OTHER): Payer: BC Managed Care – PPO | Admitting: Critical Care Medicine

## 2014-02-16 VITALS — BP 116/82 | HR 62 | Temp 97.0°F | Ht 72.0 in | Wt 217.4 lb

## 2014-02-16 DIAGNOSIS — R634 Abnormal weight loss: Secondary | ICD-10-CM

## 2014-02-16 DIAGNOSIS — R0902 Hypoxemia: Secondary | ICD-10-CM

## 2014-02-16 DIAGNOSIS — J441 Chronic obstructive pulmonary disease with (acute) exacerbation: Secondary | ICD-10-CM

## 2014-02-16 DIAGNOSIS — R5381 Other malaise: Secondary | ICD-10-CM

## 2014-02-16 DIAGNOSIS — R5383 Other fatigue: Secondary | ICD-10-CM

## 2014-02-16 LAB — HEMOGLOBIN A1C: Hgb A1c MFr Bld: 5.7 % (ref 4.6–6.5)

## 2014-02-16 NOTE — Assessment & Plan Note (Signed)
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

## 2014-02-16 NOTE — Assessment & Plan Note (Signed)
Nocturnal hypoxemia on the basis of hypoventilation and complex sleep apnea syndrome Discussed with Dr. Gwenette Greet The patient is continue to use nocturnal ASV device

## 2014-02-16 NOTE — Patient Instructions (Signed)
Labs Hemoglobin A1C  CT Abdomen/chest was ordered Discuss with Dr Gwenette Greet a second AVS machine, it will be a major challenge to get [employer] to pay for a second machine Return as needed I will call with results

## 2014-02-16 NOTE — Progress Notes (Signed)
Quick Note:  [email or web address] on pt's cell # Called pt's home #, spoke with his wife. She will ask him to call office back. Will also send msg through [facility]. ______

## 2014-02-16 NOTE — Progress Notes (Signed)
Subjective:    Patient ID: [name], male    DOB: [birth year], 53 y.o.   MRN: [id number]  HPI  53 y.o.M with known hx of OSA.   02/16/2014 Chief Complaint  Patient presents with  . Follow-up    Recently seen at [facility] - would like to discuss having further workup in [place].  o2 sat dropped after exercising at [facility].     253 to 213  40# weight loss ? On purpose.  Low sats.  Not active.  Pt can walk a mile and use a recombint bike.  Pt notes some DOE.  No further hemoptysis.  ecigs use zero nicotine Uses cpap at night  Pt using old cpap.  Pt has a new cpap with heated humidity , mask of choice   Review of Systems  Constitutional:   No  weight loss, night sweats,  + Fevers, chills, fatigue, lassitude. HEENT:   No headaches,  Difficulty swallowing,  Tooth/dental problems,  Sore throat,                No sneezing, itching, ear ache, + nasal congestion, post nasal drip,   CV:  No chest pain,  Orthopnea, PND, swelling in lower extremities, anasarca, dizziness, palpitations  GI  No heartburn, indigestion, abdominal pain, nausea, vomiting, diarrhea, change in bowel habits, loss of appetite  Resp:  .  No chest wall deformity  Skin: no rash or lesions.  GU: no dysuria, change in color of urine, no urgency or frequency.  No flank pain.  MS:  No joint pain or swelling.  No decreased range of motion.  No back pain.  Psych:  No change in mood or affect. No depression or anxiety.  No memory loss.     Objective:   Physical Exam  Filed Vitals:   02/16/14 0924  BP: 116/82  Pulse: 62  Temp: 97 F (36.1 C)  TempSrc: Oral  Height: 6' (1.829 m)  Weight: 217 lb 6.4 oz (98.612 kg)  SpO2: 98%    Gen: Pleasant, well-nourished, in no distress,  normal affect  ENT: No lesions,  mouth clear,  oropharynx clear, no postnasal drip  Neck: No JVD, no TMG, no carotid bruits  Lungs: No use of accessory muscles, no dullness to percussion, no rhonchi  Cardiovascular: RRR, heart sounds  normal, no murmur or gallops, no peripheral edema  Abdomen: soft and NT, no HSM,  BS normal  Musculoskeletal: No deformities, no cyanosis or clubbing  Neuro: alert, non focal  Skin: Warm, no lesions or rashes  Spirometry on 03/16/2013 shows normal spirometry with FEV1 and FVC greater than 100% per day     Assessment & Plan:   Decreased body weight Unexplained 40 pound weight loss 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 Gwenette Greet The patient is continue to use nocturnal ASV device  Obstructive chronic bronchitis with exacerbation History of chronic bronchitis smoking induced but note now no active cough and no further hemoptysis. Pulmonary function testing from [facility] in April 2013 revealed normal diffusion capacity normal lung capacity and normal spirometry therefore there is no evidence in this patient for COPD    Updated Medication List Outpatient Encounter Prescriptions as of 02/16/2014  Medication Sig  . alfuzosin (UROXATRAL) 10 MG 24 hr tablet as needed.   Marland Kitchen aspirin  81 MG tablet Take 81 mg by mouth daily.  . beclomethasone (QVAR) 40 MCG/ACT inhaler Inhale 1 puff into the lungs 2 (two) times daily.  . carvedilol (COREG) 25 MG tablet Take by mouth. 25 mg in the mornings and 12.5 mg at night  . clopidogrel (PLAVIX) 75 MG tablet TAKE 1 TABLET EACH DAY.  Marland Kitchen CRESTOR 40 MG tablet TAKE 1 TABLET DAILY.  . diazepam (VALIUM) 10 MG tablet Take 10 mg by mouth 2 (two) times daily. For muscle spasms  . ezetimibe (ZETIA) 10 MG tablet Take 1 tablet (10 mg total) by mouth daily.  Marland Kitchen lactulose (CHRONULAC) 10 GM/15ML solution Take 20 g by mouth daily as needed. For constipation  . lisinopril (PRINIVIL,ZESTRIL) 10 MG tablet TAKE 1 TABLET ONCE DAILY.  Marland Kitchen  LORazepam (ATIVAN) 1 MG tablet Take 1 mg by mouth 4 (four) times daily. For anxiety  . morphine (MS CONTIN) 100 MG 12 hr tablet Take 1 tablet (100 mg total) by mouth 3 (three) times daily.  Marland Kitchen morphine (MSIR) 30 MG tablet Take 1 tablet (30 mg total) by mouth every 6 (six) hours as needed for severe pain.  . nitroGLYCERIN (NITROSTAT) 0.4 MG SL tablet Place 0.4 mg under the tongue every 5 (five) minutes as needed for chest pain.  . sildenafil (VIAGRA) 100 MG tablet Take 1 tablet (100 mg total) by mouth as needed for erectile dysfunction.  Marland Kitchen spironolactone (ALDACTONE) 25 MG tablet TAKE (1/2) TABLET DAILY.  Marland Kitchen testosterone cypionate (DEPOTESTOTERONE CYPIONATE) 200 MG/ML injection Inject into the muscle every 14 (fourteen) days.    Marland Kitchen torsemide (DEMADEX) 20 MG tablet Take 1 tablet (20 mg total) by mouth daily as needed. For swelling in legs  . traZODone (DESYREL) 100 MG tablet Take 100 mg by mouth at bedtime.  Marland Kitchen zolpidem (AMBIEN) 10 MG tablet Take 10 mg by mouth at bedtime as needed. For sleep  . Zolpidem Tartrate (INTERMEZZO) 3.5 MG SUBL Place under the tongue as needed.  . [DISCONTINUED] aspirin 325 MG tablet Take 325 mg by mouth 2 (two) times daily.   . [DISCONTINUED] gabapentin (NEURONTIN) 300 MG capsule 2 (two) times daily.  . [DISCONTINUED] lisinopril-hydrochlorothiazide (PRINZIDE,ZESTORETIC) 20-25 MG per tablet Take 1 tablet by mouth daily.  . [DISCONTINUED] penicillin v potassium (VEETID) 500 MG tablet Take 1 tablet (500 mg total) by mouth 2 (two) times daily.

## 2014-02-16 NOTE — Assessment & Plan Note (Signed)
History of chronic bronchitis smoking induced but note now no active cough and no further hemoptysis. Pulmonary function testing from [facility] in April 2013 revealed normal diffusion capacity normal lung capacity and normal spirometry therefore there is no evidence in this patient for COPD

## 2014-02-19 ENCOUNTER — Telehealth: Payer: Self-pay | Admitting: Critical Care Medicine

## 2014-02-19 NOTE — Progress Notes (Signed)
Quick Note:  [email or web address] on pt's cell # Called pt's home - spoke with wife. Pt unavailable at this time. She will ask him to return call. ______

## 2014-02-19 NOTE — Progress Notes (Signed)
Quick Note:  Pt aware of results - see 02/19/14 phone msg. ______

## 2014-02-19 NOTE — Telephone Encounter (Signed)
Notes Recorded by Elsie Stain, MD on 02/16/2014 at 11:56 AM Call pt and tell him he does NOT have Diabetes. HgA1C NORMAL.  -------  Called, spoke with pt.  Informed him of lab results.  He verbalized understanding and voiced no further questions or concerns at this time.

## 2014-02-20 ENCOUNTER — Other Ambulatory Visit: Payer: BC Managed Care – PPO

## 2014-02-20 ENCOUNTER — Ambulatory Visit (INDEPENDENT_AMBULATORY_CARE_PROVIDER_SITE_OTHER)
Admission: RE | Admit: 2014-02-20 | Discharge: 2014-02-20 | Disposition: A | Discharge status: Home / Self Care | Payer: BC Managed Care – PPO | Source: Ambulatory Visit | Attending: Critical Care Medicine | Admitting: Critical Care Medicine

## 2014-02-20 DIAGNOSIS — R5381 Other malaise: Secondary | ICD-10-CM

## 2014-02-20 DIAGNOSIS — R5383 Other fatigue: Secondary | ICD-10-CM

## 2014-02-20 DIAGNOSIS — R634 Abnormal weight loss: Secondary | ICD-10-CM

## 2014-02-20 MED ORDER — IOHEXOL 300 MG/ML  SOLN
100.0000 mL | Freq: Once | INTRAMUSCULAR | Status: AC | PRN
  Administered 2014-02-20: 100 mL via INTRAVENOUS

## 2014-02-22 ENCOUNTER — Telehealth: Payer: Self-pay | Admitting: Internal Medicine

## 2014-02-22 NOTE — Telephone Encounter (Signed)
New message  Pt call states that he took a very bad fall down floating stairs, fell through the wall and hit his defibulator. request a remote check.. he says there are no lights on except for the power light. Transferred call to device clinic

## 2014-02-22 NOTE — Telephone Encounter (Signed)
LMOVM for pt to return call 

## 2014-02-22 NOTE — Telephone Encounter (Signed)
Pt called and stated that he fell through a wall. When pt feel through the wall through he feel on his left sided. He said the area around his pacemaker hurts badly. Pt states that it is a little diffcult to breath. Pt just had a complete body scan 2 days ago. I walked pt through a manual transmission.

## 2014-02-23 ENCOUNTER — Telehealth: Payer: Self-pay | Admitting: *Deleted

## 2014-02-23 NOTE — Telephone Encounter (Signed)
Called pt regarding CT scna results. He reports at last OV with PW he reports he was told [facility] wanted him to have another titration study done. He reports he was scheduled for this in the past. I do not see any orders in EPIC nor is this mentioned in last OV note with PW or last OV note with [facility]. Please advise thanks

## 2014-02-23 NOTE — Telephone Encounter (Signed)
I was told by Dr. Joya Gaskins that he asked pt to get an apptm to come in and see me if he was not doing well with his ASV device.  Go ahead and make this appt, and have him bring his machine with card  To visit.

## 2014-02-23 NOTE — Telephone Encounter (Signed)
Spoke with patient-he is on schedule for Monday 02-26-14 at 9:30am to see [facility]. He is aware to bring his machine and card to this visit per [facility]. Nothing more needed at this time.

## 2014-02-25 ENCOUNTER — Ambulatory Visit (INDEPENDENT_AMBULATORY_CARE_PROVIDER_SITE_OTHER): Payer: BC Managed Care – PPO

## 2014-02-25 ENCOUNTER — Ambulatory Visit (HOSPITAL_BASED_OUTPATIENT_CLINIC_OR_DEPARTMENT_OTHER)
Admission: RE | Admit: 2014-02-25 | Discharge: 2014-02-25 | Disposition: A | Discharge status: Home / Self Care | Payer: BC Managed Care – PPO | Source: Ambulatory Visit | Attending: Family Medicine | Admitting: Family Medicine

## 2014-02-25 ENCOUNTER — Ambulatory Visit (INDEPENDENT_AMBULATORY_CARE_PROVIDER_SITE_OTHER): Payer: BC Managed Care – PPO | Admitting: Family Medicine

## 2014-02-25 VITALS — BP 118/68 | HR 74 | Temp 97.7°F | Resp 16

## 2014-02-25 DIAGNOSIS — T1490XA Injury, unspecified, initial encounter: Secondary | ICD-10-CM

## 2014-02-25 DIAGNOSIS — R079 Chest pain, unspecified: Secondary | ICD-10-CM

## 2014-02-25 DIAGNOSIS — S0990XA Unspecified injury of head, initial encounter: Secondary | ICD-10-CM | POA: Diagnosis not present

## 2014-02-25 DIAGNOSIS — K5900 Constipation, unspecified: Secondary | ICD-10-CM

## 2014-02-25 DIAGNOSIS — W19XXXA Unspecified fall, initial encounter: Secondary | ICD-10-CM | POA: Diagnosis not present

## 2014-02-25 DIAGNOSIS — R51 Headache: Secondary | ICD-10-CM | POA: Diagnosis present

## 2014-02-25 DIAGNOSIS — R42 Dizziness and giddiness: Secondary | ICD-10-CM | POA: Insufficient documentation

## 2014-02-25 LAB — POCT URINALYSIS DIPSTICK
Blood, UA: NEGATIVE
Glucose, UA: NEGATIVE
Leukocytes, UA: NEGATIVE
Nitrite, UA: NEGATIVE
Protein, UA: 30
Spec Grav, UA: >=1.03
Urobilinogen, UA: 1
pH, UA: 6

## 2014-02-25 LAB — POCT UA - MICROSCOPIC ONLY
Bacteria, U Microscopic: NEGATIVE
Casts, Ur, LPF, POC: NEGATIVE
Crystals, Ur, HPF, POC: NEGATIVE
Mucus, UA: NEGATIVE
Yeast, UA: NEGATIVE

## 2014-02-25 LAB — POCT CBC
Granulocyte percent: 61.8 %G (ref 37–80)
HCT, POC: 46.9 % (ref 43.5–53.7)
Hemoglobin: 15.7 g/dL (ref 14.1–18.1)
Lymph, poc: 1.7 (ref 0.6–3.4)
MCH, POC: 33.8 pg — AB (ref 27–31.2)
MCHC: 33.4 g/dL (ref 31.8–35.4)
MCV: 101.3 fL — AB (ref 80–97)
MID (cbc): 0.6 (ref 0–0.9)
MPV: 7.3 fL (ref 0–99.8)
POC Granulocyte: 3.7 (ref 2–6.9)
POC LYMPH PERCENT: 28.4 %L (ref 10–50)
POC MID %: 9.8 %M (ref 0–12)
Platelet Count, POC: 146 10*3/uL (ref 142–424)
RBC: 4.63 M/uL — AB (ref 4.69–6.13)
RDW, POC: 13.8 %
WBC: 6 10*3/uL (ref 4.6–10.2)

## 2014-02-25 MED ORDER — LACTULOSE 10 GM/15ML PO SOLN: 20.0000 g | Freq: Every day | ORAL | Status: DC | PRN

## 2014-02-25 MED ORDER — POLYETHYLENE GLYCOL 3350 17 GM/SCOOP PO POWD: 17.0000 g | Freq: Two times a day (BID) | ORAL | Status: DC | PRN

## 2014-02-25 NOTE — Progress Notes (Addendum)
Subjective:   This chart was scribed for Delman Cheadle, MD by Thea Alken, ED Scribe. This patient was seen in room 6 and the patient's care was started at 4:18 PM.   Patient ID: [name], male    DOB: 24-Oct-1960, 53 y.o.   MRN: [id number]  Chest Pain  Associated symptoms include a cough, dizziness, headaches and shortness of breath. Pertinent negatives include no fever.   Chief Complaint  Patient presents with  . Chest Pain    Pt. fell and hit chest,    HPI Comments: [name] is a 53 y.o. male with hx chronic heart failure, pacemaker and chronic pain who presents to the [facility] complaining of CP onset 3 days. Pt reports his foot got caught under the steps causing him to fall through a wall that consisted of wood paneling and sheet rock landing directly on his chest and head.  Pt reports he has improving left hip pain, chest pain,  SOB, HA and dizziness. Pt reports 1 day after fall he was was unable to bear weight to left hip but reports pain is improving. He states he is now able to bear weight but has pain with doing so.  Pt reports SOB and that he is unable to walk to his kitchen without being able to catch his breath. Pt takes morphine, 20 mg of valium and 4g of ativan a day but has been taken a little more ativan and valium than normal  due to the pain.   Pt reports hx testicular cancer.  Pt also has a cough. He reports pain with taking deep breaths on the left side.   Pt reports he received a full body CT scan 3 days ago.    Pt reports he on heart transplant at [facility] due to chronic heart failure. He has several specialist at [facility] so if he has to go to the ER would rather go there.   Past Medical History  Diagnosis Date  . Chronic systolic heart failure     EF 20-25%. s/p ST. Jude ICD  . CAD (coronary artery disease)     Last cath 2/12. 3-v CAD. Failed PCI of distal RCAc  CATH [facility] 4/13 with DES to  LAD X2  . Chronic back pain     lumbar stenosis    . Benign prostatic hypertrophy   . Depression   . History of testicular cancer   . Narcotic dependence     chronic  . Benzodiazepine dependence     chronic  . Anxiety   . CHF (congestive heart failure)   . DJD (degenerative joint disease)   . Automatic implantable cardiac defibrillator [employer] Jud[employer]     Analyze ST   Past Surgical History  Procedure Laterality Date  . Hernia repair    . Asd repair, sinus venosus    . Testicle surgery      testicular cancer surgery  . Cardiac defibrillator placement  08/2010   Prior to Admission medications   Medication Sig Start Date End Date Taking? Authorizing Provider  alfuzosin (UROXATRAL) 10 MG 24 hr tablet as needed.  03/17/13  Yes Historical Provider, MD  aspirin 81 MG tablet Take 81 mg by mouth daily.   Yes Historical Provider, MD  beclomethasone (QVAR) 40 MCG/ACT inhaler Inhale 1 puff into the lungs 2 (two) times daily. 10/27/13  Yes Elsie Stain, MD  carvedilol (COREG) 25 MG tablet Take by mouth. 25 mg in the mornings  and 12.5 mg at night   Yes Historical Provider, MD  clopidogrel (PLAVIX) 75 MG tablet TAKE 1 TABLET EACH DAY.   Yes Jolaine Artist, MD  CRESTOR 40 MG tablet TAKE 1 TABLET DAILY. 01/14/13  Yes Jolaine Artist, MD  diazepam (VALIUM) 10 MG tablet Take 10 mg by mouth 2 (two) times daily. For muscle spasms 12/05/10  Yes Clovis Cao, MD  ezetimibe (ZETIA) 10 MG tablet Take 1 tablet (10 mg total) by mouth daily. 12/01/12  Yes Jolaine Artist, MD  lactulose (CHRONULAC) 10 GM/15ML solution Take 20 g by mouth daily as needed. For constipation   Yes Historical Provider, MD  lisinopril (PRINIVIL,ZESTRIL) 10 MG tablet TAKE 1 TABLET ONCE DAILY.   Yes Jolaine Artist, MD  LORazepam (ATIVAN) 1 MG tablet Take 1 mg by mouth 4 (four) times daily. For anxiety   Yes Historical Provider, MD  morphine (MS CONTIN) 100 MG 12 hr tablet Take 1 tablet (100 mg total) by mouth 3 (three) times daily. 01/31/14  Yes Danella Sensing, NP  morphine (MSIR)  30 MG tablet Take 1 tablet (30 mg total) by mouth every 6 (six) hours as needed for severe pain. 01/31/14  Yes Danella Sensing, NP  nitroGLYCERIN (NITROSTAT) 0.4 MG SL tablet Place 0.4 mg under the tongue every 5 (five) minutes as needed for chest pain.   Yes Historical Provider, MD  sildenafil (VIAGRA) 100 MG tablet Take 1 tablet (100 mg total) by mouth as needed for erectile dysfunction. 12/18/13  Yes Jolaine Artist, MD  spironolactone (ALDACTONE) 25 MG tablet TAKE (1/2) TABLET DAILY. 09/06/13  Yes Deboraha Sprang, MD  testosterone cypionate (DEPOTESTOTERONE CYPIONATE) 200 MG/ML injection Inject into the muscle every 14 (fourteen) days.     Yes Historical Provider, MD  torsemide (DEMADEX) 20 MG tablet Take 1 tablet (20 mg total) by mouth daily as needed. For swelling in legs 01/11/13  Yes Jolaine Artist, MD  traZODone (DESYREL) 100 MG tablet Take 100 mg by mouth at bedtime.   Yes Historical Provider, MD  zolpidem (AMBIEN) 10 MG tablet Take 10 mg by mouth at bedtime as needed. For sleep   Yes Historical Provider, MD  Zolpidem Tartrate (INTERMEZZO) 3.5 MG SUBL Place under the tongue as needed.   Yes Historical Provider, MD   Review of Systems  Constitutional: Negative for fever and chills.  Respiratory: Positive for cough and shortness of breath.   Cardiovascular: Positive for chest pain.  Musculoskeletal: Positive for arthralgias, gait problem and myalgias.  Neurological: Positive for dizziness and headaches.   BP 118/68  Pulse 74  Temp(Src) 97.7 F (36.5 C)  Resp 16  SpO2 97% Objective:   Physical Exam  Nursing note and vitals reviewed. Constitutional: He is oriented to person, place, and time. He appears well-developed and well-nourished. No distress.  HENT:  Head: Normocephalic and atraumatic.  Right Ear: Tympanic membrane is erythematous. A middle ear effusion is present.  Left Ear: Tympanic membrane is erythematous. A middle ear effusion is present.  Nose: Mucosal edema and  rhinorrhea present.  Mouth/Throat: Mucous membranes are dry.  Eyes: Conjunctivae and EOM are normal.  Neck: Neck supple.  Cardiovascular: Normal rate and regular rhythm.  Exam reveals no gallop and no friction rub.   Murmur heard.  Systolic murmur is present with a grade of 4/6  Pulmonary/Chest: Effort normal and breath sounds normal. No respiratory distress. He has no wheezes. He exhibits no tenderness.  Musculoskeletal: Normal range of motion.  Neurological: He is alert and oriented to person, place, and time.  Skin: Skin is warm and dry.  Psychiatric: He has a normal mood and affect. His behavior is normal.   [facility] reading (PRIMARY) by Dr. Brigitte Pulse.  CXR no acute abnormality and change from prior.  Left hip X-ray no acute abnormality.    Assessment & Plan:   Chest pain, unspecified - Plan: EKG 12-Lead, DG Chest 2 View, POCT CBC  Injury - Plan: DG Hip Complete Left, POCT urinalysis dipstick, POCT UA - Microscopic Only, POCT CBC  Headache(784.0) - Plan: CT Head Wo Contrast  Unspecified constipation  Meds ordered this encounter  Medications  . polyethylene glycol powder (GLYCOLAX/MIRALAX) powder    Sig: Take 17 g by mouth 2 (two) times daily as needed.    Dispense:  255 g    Refill:  1  . lactulose (CHRONULAC) 10 GM/15ML solution    Sig: Take 30 mLs (20 g total) by mouth daily as needed. For constipation    Dispense:  500 mL    Refill:  1    I personally performed the services described in this documentation, which was scribed in my presence. The recorded information has been reviewed and considered, and addended by me as needed.  Delman Cheadle, MD MPH     ADDENDUM: 9:00 p.m.  Checked to see if head CT results were in yet - exam has not begun yet. Called pt - he states she didn't have a time scheduled so was just waiting to here from [place].  Pt was supposed to go directly there from our office but he did not understand that. He is now at home with his wife. Gave pt phone # for  medcenter at [facility] and advised to call them to see if head CT could still be done tonight then go straight over for imaging.  Pt understands and agrees.

## 2014-02-25 NOTE — Patient Instructions (Signed)
Your chest xray and hip xray look good.  Your pacer leads appear to be in the correct place and your lungs and heart look unchanged from prior xray in April. However, your shortness of breath and new cough is concerning to me. I would recommend that you follow-up with your cardiologist ASAP tomorrow - especially call your specialists at [facility] and let them know what happened. As you know, you are definitely overmedicated and so are VERY likely to fall again - you really need to try to wean down on at least some of them - especially the valium, ativan, ambien, intermezzo. Your neurologic exam is abnormal and so it is possible that you could have a bleed in your head from your fall.  However, this could also be from the high level of sedative medications you are on.  The only way to guarantee this is not the case would be to go to the ER for a head CT tonight which I think you should do tonight as you are on plavix.  You likely have costochondritis - rib bruising causing the chest pain.  Lots of ice to decrease inflammation to the chest wall but do not change the amount of pain medication you are taking or use any over-the-counter medications.  Costochondritis Costochondritis, sometimes called Tietze syndrome, is a swelling and irritation (inflammation) of the tissue (cartilage) that connects your ribs with your breastbone (sternum). It causes pain in the chest and rib area. Costochondritis usually goes away on its own over time. It can take up to 6 weeks or longer to get better, especially if you are unable to limit your activities. CAUSES  Some cases of costochondritis have no known cause. Possible causes include:  Injury (trauma).  Exercise or activity such as lifting.  Severe coughing. SIGNS AND SYMPTOMS  Pain and tenderness in the chest and rib area.  Pain that gets worse when coughing or taking deep breaths.  Pain that gets worse with specific movements. DIAGNOSIS  Your health care  provider will do a physical exam and ask about your symptoms. Chest X-rays or other tests may be done to rule out other problems. TREATMENT  Costochondritis usually goes away on its own over time. Your health care provider may prescribe medicine to help relieve pain. HOME CARE INSTRUCTIONS   Avoid exhausting physical activity. Try not to strain your ribs during normal activity. This would include any activities using chest, abdominal, and side muscles, especially if heavy weights are used.  Apply ice to the affected area for the first 2 days after the pain begins.  Put ice in a plastic bag.  Place a towel between your skin and the bag.  Leave the ice on for 20 minutes, 2-3 times a day.  Only take over-the-counter or prescription medicines as directed by your health care provider. SEEK MEDICAL CARE IF:  You have redness or swelling at the rib joints. These are signs of infection.  Your pain does not go away despite rest or medicine. SEEK IMMEDIATE MEDICAL CARE IF:   Your pain increases or you are very uncomfortable.  You have shortness of breath or difficulty breathing.  You cough up blood.  You have worse chest pains, sweating, or vomiting.  You have a fever or persistent symptoms for more than 2-3 days.  You have a fever and your symptoms suddenly get worse. MAKE SURE YOU:   Understand these instructions.  Will watch your condition.  Will get help right away if you are not  doing well or get worse. Document Released: 04/08/2005 Document Revised: 04/19/2013 Document Reviewed: 01/31/2013 [facility] Patient Information 2015 [place], [place]. This information is not intended to replace advice given to you by your health care provider. Make sure you discuss any questions you have with your health care provider.

## 2014-02-26 ENCOUNTER — Ambulatory Visit: Payer: BC Managed Care – PPO | Admitting: Pulmonary Disease

## 2014-02-26 ENCOUNTER — Telehealth: Payer: Self-pay | Admitting: Pulmonary Disease

## 2014-02-26 ENCOUNTER — Other Ambulatory Visit: Payer: Self-pay | Admitting: Family Medicine

## 2014-02-26 ENCOUNTER — Encounter: Payer: Self-pay | Admitting: Pulmonary Disease

## 2014-02-26 VITALS — BP 110/72 | HR 71 | Temp 98.2°F | Ht 72.0 in | Wt 209.2 lb

## 2014-02-26 DIAGNOSIS — G4731 Primary central sleep apnea: Secondary | ICD-10-CM

## 2014-02-26 NOTE — Telephone Encounter (Signed)
Calling pt about missed appt today. Appt was to f/u on cpap issues that he told Dr. Joya Gaskins that he was having. Pt No Showed for appt today. Per [facility] call the pt to see what issues he is having and to reschedule appt [id number].  [place], CMA

## 2014-02-26 NOTE — Assessment & Plan Note (Signed)
No visit

## 2014-02-26 NOTE — Progress Notes (Signed)
   Subjective:    Patient ID: [name], male    DOB: 1961-02-28, 53 y.o.   MRN: [id number]  HPI No visit   Review of Systems  Constitutional: Positive for unexpected weight change. Negative for fever.  HENT: Negative for congestion, dental problem, ear pain, nosebleeds, postnasal drip, rhinorrhea, sinus pressure, sneezing, sore throat and trouble swallowing.   Eyes: Negative for redness and itching.  Respiratory: Negative for cough, chest tightness, shortness of breath and wheezing.   Cardiovascular: Negative for palpitations and leg swelling.  Gastrointestinal: Negative for nausea and vomiting.  Genitourinary: Negative for dysuria.  Musculoskeletal: Negative for joint swelling.  Skin: Negative for rash.  Neurological: Negative for headaches.  Hematological: Does not bruise/bleed easily.  Psychiatric/Behavioral: Negative for dysphoric mood. The patient is not nervous/anxious.        Objective:   Physical Exam        Assessment & Plan:

## 2014-02-26 NOTE — Telephone Encounter (Signed)
Pt rescheduled to tomorrow at 3:30.[place] Bing, CMA

## 2014-02-26 NOTE — Patient Instructions (Signed)
No visit

## 2014-02-27 ENCOUNTER — Ambulatory Visit: Payer: BC Managed Care – PPO | Admitting: Pulmonary Disease

## 2014-02-27 ENCOUNTER — Encounter: Payer: Self-pay | Admitting: Pulmonary Disease

## 2014-02-27 VITALS — BP 130/70 | HR 78 | Temp 98.0°F | Ht 72.0 in | Wt 209.0 lb

## 2014-02-27 DIAGNOSIS — G4731 Primary central sleep apnea: Secondary | ICD-10-CM

## 2014-02-27 NOTE — Progress Notes (Signed)
   Subjective:    Patient ID: [name], male    DOB: June 08, 1961, 53 y.o.   MRN: [id number]  HPI Patient comes in today for followup of his complex apnea. Apparently has been having issues with his device, and comes down it is simply related to him using his father's CPAP device for naps. He does this because it is too inconvenient to bring his device and to another room for a nap  we have done a download today from his CPAP machine, and he is having excellent control of his AHI when he is wearing the device. Unfortunately, he is missing many days, and is only averaging about 3-4 hours a day maximum. I have stressed to him the importance of total compliance. He is also having issues with dryness, and I have talked with him about adjusting the heated humidifier.   Review of Systems  Constitutional: Negative for fever and unexpected weight change.  HENT: Negative for congestion, dental problem, ear pain, nosebleeds, postnasal drip, rhinorrhea, sinus pressure, sneezing, sore throat and trouble swallowing.   Eyes: Negative for redness and itching.  Respiratory: Negative for cough, chest tightness, shortness of breath and wheezing.   Cardiovascular: Negative for palpitations and leg swelling.  Gastrointestinal: Negative for nausea and vomiting.  Genitourinary: Negative for dysuria.  Musculoskeletal: Negative for joint swelling.  Skin: Negative for rash.  Neurological: Negative for headaches.  Hematological: Does not bruise/bleed easily.  Psychiatric/Behavioral: Negative for dysphoric mood. The patient is not nervous/anxious.        Objective:   Physical Exam Overweight male in no acute distress Nose without purulence or discharge noted Neck without lymphadenopathy or thyromegaly No skin breakdown or pressure necrosis from the CPAP mask Lower extremities with minimal edema, no cyanosis Alert and oriented, moves all 4 extremities.       Assessment & Plan:

## 2014-02-27 NOTE — Assessment & Plan Note (Signed)
The patient's sleep apnea is very well controlled when he is wearing his ASV device, but he obviously needs to be more compliant with its use. I have also explained that he cannot use a different type of device, since a regular CPAP machine will actually induce worsening apnea.  I will have his home care company show him how to make adjustments to the heated humidifier, and will also check overnight oximetry since he is questioning incessantly his need for oxygen at night. Again, his primary issue is related to noncompliance with his device. He believes this is primarily because he does not sleep but a short period of time each night.

## 2014-02-27 NOTE — Patient Instructions (Signed)
You need to wear your ASV device everynight for at least 6hrs a night, and also with naps during the day.  Your father's cpap device is not the same, and will not treat you appropriately. Will send an order to your home care company to show you how to use the heated humidifier, and can also give you an estimate of the cost to purchase another ASV machine.  Cancel any leftover apptm, and followup with me again in 1mos

## 2014-03-02 ENCOUNTER — Encounter: Payer: BC Managed Care – PPO | Attending: Physical Medicine & Rehabilitation

## 2014-03-02 ENCOUNTER — Other Ambulatory Visit: Payer: Self-pay

## 2014-03-02 ENCOUNTER — Ambulatory Visit: Payer: BC Managed Care – PPO | Admitting: Physical Medicine & Rehabilitation

## 2014-03-02 ENCOUNTER — Encounter: Payer: Self-pay | Admitting: Physical Medicine & Rehabilitation

## 2014-03-02 VITALS — BP 138/76 | HR 66 | Resp 14 | Wt 220.6 lb

## 2014-03-02 DIAGNOSIS — F329 Major depressive disorder, single episode, unspecified: Secondary | ICD-10-CM | POA: Diagnosis not present

## 2014-03-02 DIAGNOSIS — Z95810 Presence of automatic (implantable) cardiac defibrillator: Secondary | ICD-10-CM | POA: Diagnosis not present

## 2014-03-02 DIAGNOSIS — G8929 Other chronic pain: Secondary | ICD-10-CM | POA: Diagnosis not present

## 2014-03-02 DIAGNOSIS — Z9181 History of falling: Secondary | ICD-10-CM | POA: Insufficient documentation

## 2014-03-02 DIAGNOSIS — I5022 Chronic systolic (congestive) heart failure: Secondary | ICD-10-CM | POA: Diagnosis not present

## 2014-03-02 DIAGNOSIS — I509 Heart failure, unspecified: Secondary | ICD-10-CM | POA: Diagnosis not present

## 2014-03-02 DIAGNOSIS — G4733 Obstructive sleep apnea (adult) (pediatric): Secondary | ICD-10-CM | POA: Diagnosis not present

## 2014-03-02 DIAGNOSIS — F3289 Other specified depressive episodes: Secondary | ICD-10-CM | POA: Diagnosis not present

## 2014-03-02 DIAGNOSIS — I2510 Atherosclerotic heart disease of native coronary artery without angina pectoris: Secondary | ICD-10-CM | POA: Insufficient documentation

## 2014-03-02 DIAGNOSIS — M48062 Spinal stenosis, lumbar region with neurogenic claudication: Secondary | ICD-10-CM | POA: Diagnosis present

## 2014-03-02 DIAGNOSIS — R269 Unspecified abnormalities of gait and mobility: Secondary | ICD-10-CM | POA: Diagnosis not present

## 2014-03-02 DIAGNOSIS — G4737 Central sleep apnea in conditions classified elsewhere: Secondary | ICD-10-CM | POA: Insufficient documentation

## 2014-03-02 MED ORDER — MORPHINE SULFATE ER 100 MG PO TBCR: 100.0000 mg | EXTENDED_RELEASE_TABLET | Freq: Three times a day (TID) | ORAL | Status: DC

## 2014-03-02 MED ORDER — MORPHINE SULFATE 30 MG PO TABS: 30.0000 mg | ORAL_TABLET | Freq: Four times a day (QID) | ORAL | Status: DC | PRN

## 2014-03-02 NOTE — Telephone Encounter (Signed)
Patient was late for his appt. Appt was rescheduled RX printed for Dr. Letta Pate to sign.

## 2014-03-02 NOTE — Progress Notes (Signed)
   Subjective:    Patient ID: [name], male    DOB: Nov 01, 1960, 53 y.o.   MRN: [id number]  HPI  Pain Inventory Average Pain 8 Pain Right Now 8 My pain is sharp, burning and stabbing  In the last 24 hours, has pain interfered with the following? General activity 2 Relation with others 2 Enjoyment of life 2 What TIME of day is your pain at its worst? morning daytime and evening Sleep (in general) Fair  Pain is worse with: walking, bending and standing Pain improves with: medication Relief from Meds: 7  Mobility walk with assistance use a walker do you drive?  yes  Function Do you have any goals in this area?  yes  Neuro/Psych weakness numbness  Prior Studies Any changes since last visit?  yes CT/MRI After fall down stairs Physicians involved in your care Any changes since last visit?  no   Family History  Problem Relation Age of Onset  . Heart disease Father 33    Died of MI   History   Social History  . Marital Status: Married    Spouse Name: N/A    Number of Children: N/A  . Years of Education: N/A   Social History Main Topics  . Smoking status: Current Some Day Smoker -- 1.50 packs/day for 29 years    Types: Cigarettes    Last Attempt to Quit: 03/27/2013  . Smokeless tobacco: Never Used     Comment: PT STOPPED SMOKING CIGS 03/27/13. NOW CURRENTLY USING E-CIG  . Alcohol Use: No  . Drug Use: Yes    Special: Marijuana     Comment: Urine showed THC  . Sexual Activity: None   Other Topics Concern  . None   Social History Narrative  . None   Past Surgical History  Procedure Laterality Date  . Hernia repair    . Asd repair, sinus venosus    . Testicle surgery      testicular cancer surgery  . Cardiac defibrillator placement  08/2010   Past Medical History  Diagnosis Date  . Chronic systolic heart failure     EF 20-25%. s/p ST. Jude ICD  . CAD (coronary artery disease)     Last cath 2/12. 3-v CAD. Failed PCI of distal RCAc  CATH [facility]  4/13 with DES to  LAD X2  . Chronic back pain     lumbar stenosis  . Benign prostatic hypertrophy   . Depression   . History of testicular cancer   . Narcotic dependence     chronic  . Benzodiazepine dependence     chronic  . Anxiety   . CHF (congestive heart failure)   . DJD (degenerative joint disease)   . Automatic implantable cardiac defibrillator [employer]     Analyze ST   BP 138/76  Pulse 66  Resp 14  Wt 220 lb 9.6 oz (100.064 kg)  SpO2 92%  Opioid Risk Score:   Fall Risk Score: High Fall Risk (>13 points) (previoulsy educated and handout given)  Review of Systems  Constitutional: Positive for unexpected weight change.  Respiratory: Positive for apnea.   Musculoskeletal: Positive for gait problem.  Neurological: Positive for weakness and numbness.  All other systems reviewed and are negative.      Objective:   Physical Exam  NO VISIT WITH MD TODAY      Assessment & Plan:

## 2014-03-08 ENCOUNTER — Encounter: Payer: Self-pay | Admitting: Internal Medicine

## 2014-03-20 ENCOUNTER — Other Ambulatory Visit (HOSPITAL_COMMUNITY): Payer: Self-pay | Admitting: Internal Medicine

## 2014-04-02 ENCOUNTER — Ambulatory Visit (HOSPITAL_BASED_OUTPATIENT_CLINIC_OR_DEPARTMENT_OTHER): Payer: BC Managed Care – PPO | Admitting: Physical Medicine & Rehabilitation

## 2014-04-02 ENCOUNTER — Encounter: Payer: Self-pay | Admitting: Gastroenterology

## 2014-04-02 ENCOUNTER — Encounter: Payer: BC Managed Care – PPO | Attending: Physical Medicine & Rehabilitation

## 2014-04-02 ENCOUNTER — Encounter: Payer: Self-pay | Admitting: Physical Medicine & Rehabilitation

## 2014-04-02 ENCOUNTER — Encounter: Payer: Self-pay | Admitting: Physician Assistant

## 2014-04-02 VITALS — BP 136/88 | HR 84 | Resp 14 | Ht 70.0 in | Wt 233.4 lb

## 2014-04-02 DIAGNOSIS — R269 Unspecified abnormalities of gait and mobility: Secondary | ICD-10-CM | POA: Diagnosis not present

## 2014-04-02 DIAGNOSIS — I2510 Atherosclerotic heart disease of native coronary artery without angina pectoris: Secondary | ICD-10-CM | POA: Diagnosis not present

## 2014-04-02 DIAGNOSIS — Z95810 Presence of automatic (implantable) cardiac defibrillator: Secondary | ICD-10-CM | POA: Diagnosis not present

## 2014-04-02 DIAGNOSIS — I5022 Chronic systolic (congestive) heart failure: Secondary | ICD-10-CM | POA: Insufficient documentation

## 2014-04-02 DIAGNOSIS — M48062 Spinal stenosis, lumbar region with neurogenic claudication: Secondary | ICD-10-CM | POA: Insufficient documentation

## 2014-04-02 DIAGNOSIS — F329 Major depressive disorder, single episode, unspecified: Secondary | ICD-10-CM | POA: Diagnosis not present

## 2014-04-02 DIAGNOSIS — G4733 Obstructive sleep apnea (adult) (pediatric): Secondary | ICD-10-CM | POA: Insufficient documentation

## 2014-04-02 DIAGNOSIS — Z9181 History of falling: Secondary | ICD-10-CM | POA: Insufficient documentation

## 2014-04-02 DIAGNOSIS — G8929 Other chronic pain: Secondary | ICD-10-CM | POA: Insufficient documentation

## 2014-04-02 DIAGNOSIS — F3289 Other specified depressive episodes: Secondary | ICD-10-CM | POA: Insufficient documentation

## 2014-04-02 DIAGNOSIS — K5909 Other constipation: Secondary | ICD-10-CM

## 2014-04-02 DIAGNOSIS — I509 Heart failure, unspecified: Secondary | ICD-10-CM | POA: Diagnosis not present

## 2014-04-02 DIAGNOSIS — T402X5A Adverse effect of other opioids, initial encounter: Secondary | ICD-10-CM

## 2014-04-02 DIAGNOSIS — G4737 Central sleep apnea in conditions classified elsewhere: Secondary | ICD-10-CM | POA: Insufficient documentation

## 2014-04-02 DIAGNOSIS — K5903 Drug induced constipation: Secondary | ICD-10-CM

## 2014-04-02 DIAGNOSIS — T40605A Adverse effect of unspecified narcotics, initial encounter: Secondary | ICD-10-CM

## 2014-04-02 MED ORDER — MORPHINE SULFATE 30 MG PO TABS: 30.0000 mg | ORAL_TABLET | Freq: Four times a day (QID) | ORAL | Status: DC | PRN

## 2014-04-02 MED ORDER — MORPHINE SULFATE ER 100 MG PO TBCR: 100.0000 mg | EXTENDED_RELEASE_TABLET | Freq: Three times a day (TID) | ORAL | Status: DC

## 2014-04-02 NOTE — Patient Instructions (Signed)
Referral to gastroenterology for opioid induced constipation

## 2014-04-02 NOTE — Progress Notes (Signed)
Subjective:    Patient ID: [name], male    DOB: [birth year], 53 y.o.   MRN: [id number]  HPI  No plans for spine surgery Constipation Fall in August contusions Pain Inventory Average Pain 8 Pain Right Now 8 My pain is constant  In the last 24 hours, has pain interfered with the following? General activity 3 Relation with others 3 Enjoyment of life 3 What TIME of day is your pain at its worst? morning, daytime, evening, night Sleep (in general) Poor  Pain is worse with: walking, bending, standing and some activites Pain improves with: rest Relief from Meds: 7  Mobility use a walker  Function disabled: date disabled .  Neuro/Psych numbness trouble walking anxiety  Prior Studies Any changes since last visit?  yes   CT/MRI 3 wks ago  Physicians involved in your care Any changes since last visit?  no   Family History  Problem Relation Age of Onset  . Heart disease Father 46    Died of MI   History   Social History  . Marital Status: Married    Spouse Name: N/A    Number of Children: N/A  . Years of Education: N/A   Social History Main Topics  . Smoking status: Current Some Day Smoker -- 1.50 packs/day for 29 years    Types: Cigarettes    Last Attempt to Quit: 03/27/2013  . Smokeless tobacco: Never Used     Comment: PT STOPPED SMOKING CIGS 03/27/13. NOW CURRENTLY USING E-CIG  . Alcohol Use: No  . Drug Use: Yes    Special: Marijuana     Comment: Urine showed THC  . Sexual Activity: None   Other Topics Concern  . None   Social History Narrative  . None   Past Surgical History  Procedure Laterality Date  . Hernia repair    . Asd repair, sinus venosus    . Testicle surgery      testicular cancer surgery  . Cardiac defibrillator placement  08/2010   Past Medical History  Diagnosis Date  . Chronic systolic heart failure     EF 20-25%. s/p ST. Jude ICD  . CAD (coronary artery disease)     Last cath 2/12. 3-v CAD. Failed PCI of distal RCAc   CATH [facility] 4/13 with DES to  LAD X2  . Chronic back pain     lumbar stenosis  . Benign prostatic hypertrophy   . Depression   . History of testicular cancer   . Narcotic dependence     chronic  . Benzodiazepine dependence     chronic  . Anxiety   . CHF (congestive heart failure)   . DJD (degenerative joint disease)   . Automatic implantable cardiac defibrillator [employer]     Analyze ST   BP 136/88  Pulse 84  Resp 14  Ht 5' 10"  (1.778 m)  Wt 233 lb 6.4 oz (105.87 kg)  BMI 33.49 kg/m2  SpO2 97%  Opioid Risk Score:   Fall Risk Score: Moderate Fall Risk (6-13 points)  Review of Systems     Objective:   Physical Exam  Nursing note and vitals reviewed. Constitutional: He is oriented to person, place, and time. He appears well-developed and well-nourished.  HENT:  Head: Normocephalic and atraumatic.  Eyes: Conjunctivae and EOM are normal. Pupils are equal, round, and reactive to light.  Neurological: He is alert and oriented to person, place, and time. He has normal reflexes.  Psychiatric: He has a  normal mood and affect.    No tenderness to palpation over chest or back Reduced spine ROS      Assessment & Plan:  1.  Lumbar spinal stenosis Has neurogenic claudication Activity limitations also influenced by cardiomyopathy Good compliance with medication overall Continue MS Contin 100 mg 3 times a day MS IR 30 mg 4 times a day Medications are helping patient remained mobile with walker as well as work part-time  Opioid monitoring program, urine drug screen's and pill counts   2.  Opioid induced constipation-referral to [facility]  In the meantime will ask patient to try Senokot S2 to 3 tablets twice a day

## 2014-04-09 ENCOUNTER — Telehealth: Payer: Self-pay | Admitting: Pulmonary Disease

## 2014-04-09 NOTE — Telephone Encounter (Signed)
Pt needed an appt to see PW

## 2014-04-11 ENCOUNTER — Encounter: Payer: Self-pay | Admitting: Critical Care Medicine

## 2014-04-11 ENCOUNTER — Ambulatory Visit (INDEPENDENT_AMBULATORY_CARE_PROVIDER_SITE_OTHER): Payer: BC Managed Care – PPO | Admitting: Critical Care Medicine

## 2014-04-11 VITALS — BP 106/70 | HR 62 | Temp 97.6°F | Ht 72.0 in | Wt 223.2 lb

## 2014-04-11 DIAGNOSIS — J0100 Acute maxillary sinusitis, unspecified: Secondary | ICD-10-CM

## 2014-04-11 DIAGNOSIS — D751 Secondary polycythemia: Secondary | ICD-10-CM

## 2014-04-11 DIAGNOSIS — G4733 Obstructive sleep apnea (adult) (pediatric): Secondary | ICD-10-CM

## 2014-04-11 DIAGNOSIS — G4737 Central sleep apnea in conditions classified elsewhere: Secondary | ICD-10-CM

## 2014-04-11 DIAGNOSIS — F17200 Nicotine dependence, unspecified, uncomplicated: Secondary | ICD-10-CM

## 2014-04-11 DIAGNOSIS — G4731 Primary central sleep apnea: Secondary | ICD-10-CM

## 2014-04-11 DIAGNOSIS — D45 Polycythemia vera: Secondary | ICD-10-CM

## 2014-04-11 DIAGNOSIS — J019 Acute sinusitis, unspecified: Secondary | ICD-10-CM | POA: Insufficient documentation

## 2014-04-11 DIAGNOSIS — Z720 Tobacco use: Secondary | ICD-10-CM

## 2014-04-11 DIAGNOSIS — J209 Acute bronchitis, unspecified: Secondary | ICD-10-CM

## 2014-04-11 DIAGNOSIS — R0902 Hypoxemia: Secondary | ICD-10-CM

## 2014-04-11 MED ORDER — LACTULOSE 10 GM/15ML PO SOLN: 20.0000 g | Freq: Two times a day (BID) | ORAL | Status: DC

## 2014-04-11 MED ORDER — LEVOFLOXACIN 500 MG PO TABS: 500.0000 mg | ORAL_TABLET | Freq: Every day | ORAL | Status: DC

## 2014-04-11 NOTE — Assessment & Plan Note (Signed)
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

## 2014-04-11 NOTE — Patient Instructions (Signed)
We will obtain oxygen for you Chronulac refilled , use twice daily Keep appointment with Dr Deatra Ina of GI for bowels Levaquin 500mg  daily sent to pharmacy Oxygen 4 Liters will be added to nocturnal bilevel AVS No other medication changes Return 2 months

## 2014-04-11 NOTE — Assessment & Plan Note (Signed)
Acute sinusitis and bronchitis Plan Levaquin 500 mg daily for 7 days

## 2014-04-11 NOTE — Progress Notes (Signed)
Subjective:    Patient ID: [name], male    DOB: 1960-07-24, 53 y.o.   MRN: [id number]  HPI  53 y.o.M with known hx of OSA.    04/11/2014 Chief Complaint  Patient presents with  . Follow-up    Increased congestion in the afternoons with yellow to green tinged mucus. No fever.  This patient returns today in followup complaining of increasing productive cough and nasal congestion. There is increased postnasal drip. The patient is on high-dose narcotics and benzodiazepines. Patient has chronic pain syndrome and chronic opioid dependence. The patient has had previous stool impaction noted on abdominal CT scan. The patient's not had a bowel movement 1 week. Patient previously was on Chronulac for this but did not improve previously with MiraLAX and lactulose or with Senokot. The patient has run out of the Chronulac which was working for his bowel program. Patient has a pending appointment with gastroenterology.  The patient had a recent overnight sleep oximetry done on his bilevel device which did reveal significant hypoxemia was straight line saturations in the low 80% range throughout a 6 hr interval of recordings on room air The patient remains on MScontin 100 tid and uses ave two MS IR per day 15mg   Pt had bowel impaction on ABD CT.  No BM in one week Pt has ASV and uses about 4hrs per night.  ONO.    Review of Systems  Constitutional:   No  weight loss, night sweats,  + Fevers, chills, fatigue, lassitude. HEENT:   No headaches,  Difficulty swallowing,  +++Tooth/dental problems,  +++Sore throat,                No sneezing, itching, ear ache, + nasal congestion,+++ post nasal drip,   CV:  No chest pain,  Orthopnea, PND, swelling in lower extremities, anasarca, dizziness, palpitations  GI  No heartburn, indigestion, abdominal pain, nausea, vomiting, diarrhea, +++change in bowel habits obstipation, no BM in 7days , loss of appetite  Resp:  .  No chest wall deformity  Skin: no rash  or lesions.  GU: no dysuria, change in color of urine, no urgency or frequency.  No flank pain.  MS:  Chronic back pain       Objective:   Physical Exam  Filed Vitals:   04/11/14 0956  BP: 106/70  Pulse: 62  Temp: 97.6 F (36.4 C)  TempSrc: Oral  Height: 6' (1.829 m)  Weight: 223 lb 3.2 oz (101.243 kg)  SpO2: 98%    Gen: Pleasant, well-nourished, in no distress,  normal affect  ENT: No lesions,  mouth clear,  oropharynx clear, no postnasal drip  Neck: No JVD, no TMG, no carotid bruits  Lungs: No use of accessory muscles, no dullness to percussion, no rhonchi  Cardiovascular: RRR, heart sounds normal, no murmur or gallops, no peripheral edema  Abdomen: soft and NT, no HSM,  BS normal  Musculoskeletal: No deformities, no cyanosis or clubbing  Neuro: alert, non focal  Skin: Warm, no lesions or rashes  Spirometry on 03/16/2013 shows normal spirometry with FEV1 and FVC greater than 100% per day     Assessment & Plan:   Complex sleep apnea syndrome Complex sleep apnea now on a as of the device with CPAP 6 cm pressure support ranging 3-15 cm water pressure with a large ResMed mask  note recent overnight oxygen study showing markedly saturation throughout the night into the mid to low 80% range. Note this is a high risk patient  and that he has severe ischemic cardiomyopathy and is on a complex high risk medication list including high-dose narcotics because of chronic opioid dependence and benzodiazepines.  Plan The patient will maintain the ASD device but will bleed in liters of oxygen via the device and repeat overnight oxygen test with oxygen therapy   Hypoxemia Hypoxemia on the basis of hypoventilation complicated by chronic narcotic use  Tobacco abuse History of tobacco use now improved however the patient still is using the vapor E. 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 alfuzosin (UROXATRAL) 10 MG 24 hr tablet as needed.   Marland Kitchen amoxicillin-clavulanate (AUGMENTIN) 500-125 MG per tablet Take 0.5 tablets by mouth 2 (two) times daily.   Marland Kitchen aspirin 81 MG tablet Take 81 mg by mouth daily.  . beclomethasone (QVAR) 40 MCG/ACT inhaler Inhale 1 puff into the lungs 2 (two) times daily.  . carvedilol (COREG) 25 MG tablet Take 25 mg by mouth 2 (two) times daily.   . clopidogrel (PLAVIX) 75 MG tablet TAKE 1 TABLET EACH DAY.  Marland Kitchen CRESTOR 40 MG tablet TAKE 1 TABLET DAILY.  . diazepam (VALIUM) 10 MG tablet Take 10 mg by mouth 2 (two) times daily.   Marland Kitchen ezetimibe (ZETIA) 10 MG tablet Take 1 tablet (10 mg total) by mouth daily.  Marland Kitchen lactulose (CHRONULAC) 10 GM/15ML solution Take 30 mLs (20 g total) by mouth 2 (two) times daily. For constipation  . lisinopril (PRINIVIL,ZESTRIL) 10 MG tablet TAKE 1 TABLET ONCE DAILY.  Marland Kitchen LORazepam (ATIVAN) 1 MG tablet Take 1 mg by mouth 4 (four) times daily as needed. For anxiety  . morphine (MS CONTIN) 100 MG 12 hr tablet Take 1 tablet (100 mg total) by mouth 3 (three) times daily.  Marland Kitchen morphine (MSIR) 30 MG tablet Take 1 tablet (30 mg total) by mouth every 6 (six) hours as needed for severe pain.  . nitroGLYCERIN (NITROSTAT) 0.4 MG SL tablet Place 0.4 mg under the tongue every 5 (five) minutes as needed for chest pain.  . polyethylene glycol powder (GLYCOLAX/MIRALAX) powder Take 17 g by mouth 2 (two) times daily as needed.  . sildenafil (VIAGRA) 100 MG tablet Take 1 tablet (100 mg total) by mouth as needed for erectile dysfunction.  Marland Kitchen spironolactone (ALDACTONE) 25 MG tablet TAKE (1/2) TABLET DAILY.  Marland Kitchen testosterone cypionate (DEPOTESTOTERONE CYPIONATE) 200 MG/ML injection Inject into the muscle every 14 (fourteen) days.    Marland Kitchen torsemide (DEMADEX) 20 MG tablet Take 1 tablet (20 mg total) by mouth daily as needed.  For swelling in legs  . traZODone (DESYREL) 100 MG tablet Take 100 mg by mouth at bedtime as needed.   . zolpidem (AMBIEN) 10 MG tablet Take 10 mg by mouth at bedtime as needed. For sleep  . Zolpidem Tartrate (INTERMEZZO) 3.5 MG SUBL Place under the tongue as needed.  . [DISCONTINUED] lactulose (CHRONULAC) 10 GM/15ML solution Take 30 mLs (20 g total) by mouth daily as needed. For constipation  . levofloxacin (LEVAQUIN) 500 MG tablet Take 1 tablet (500 mg total) by mouth daily.

## 2014-04-11 NOTE — Assessment & Plan Note (Signed)
Complex sleep apnea now on a as of the device with CPAP 6 cm pressure support ranging 3-15 cm water pressure with a large ResMed mask  note recent overnight oxygen study showing markedly saturation throughout the night into the mid to low 80% range. Note this is a high risk patient and that he has severe ischemic cardiomyopathy and is on a complex high risk medication list including high-dose narcotics because of chronic opioid dependence and benzodiazepines.  Plan The patient will maintain the ASD device but will bleed in liters of oxygen via the device and repeat overnight oxygen test with oxygen therapy

## 2014-04-11 NOTE — Assessment & Plan Note (Signed)
History of tobacco use now improved however the patient still is using the vapor E. Cigarettes

## 2014-04-11 NOTE — Assessment & Plan Note (Signed)
Hypoxemia on the basis of hypoventilation complicated by chronic narcotic use

## 2014-04-12 ENCOUNTER — Encounter: Payer: Self-pay | Admitting: Physician Assistant

## 2014-04-12 ENCOUNTER — Ambulatory Visit (INDEPENDENT_AMBULATORY_CARE_PROVIDER_SITE_OTHER): Payer: BC Managed Care – PPO | Admitting: Physician Assistant

## 2014-04-12 VITALS — BP 128/74 | HR 70 | Ht 73.0 in | Wt 224.0 lb

## 2014-04-12 DIAGNOSIS — Z1211 Encounter for screening for malignant neoplasm of colon: Secondary | ICD-10-CM

## 2014-04-12 DIAGNOSIS — Z7901 Long term (current) use of anticoagulants: Secondary | ICD-10-CM

## 2014-04-12 DIAGNOSIS — K5900 Constipation, unspecified: Secondary | ICD-10-CM

## 2014-04-12 DIAGNOSIS — K5909 Other constipation: Secondary | ICD-10-CM

## 2014-04-12 MED ORDER — LINACLOTIDE 145 MCG PO CAPS: 145.0000 ug | ORAL_CAPSULE | Freq: Every day | ORAL | Status: DC

## 2014-04-12 NOTE — Progress Notes (Signed)
Subjective:    Patient ID: [name], male    DOB: 10/02/1960, 53 y.o.   MRN: [id number]  HPI  [name] is a 53 year old white male reached GERD today by Dr Letta Pate Janene Harvey management for opioid induced constipation. Patient had previously seen Dr. Earlean Shawl but said that had been about 10 years ago and that he has not had a colonoscopy for over 10 years. He does not believe he had any polyps. Patient is currently using lactulose 30 cc once daily and has had 2 double up on the dose on occasion. Prior to that he was having a bowel movement every 3-5 days. He started taking lactulose along with MiraLax on a daily basis and says that still wasn't working until he doubled up on the lactulose. He apparently completely emptied his bowel yesterday and says he feels better. Constipation has been a long-term problem. His not have any current problems with abdominal pain or rectal bleeding. He is a somewhat scattered historian he mentions that he had a workup in [place] with  Doctor [place] ,nd may have been told at one point that he had Crohn's disease however he cannot tell me that he was on any medications for Crohn's., And was not happy with that physician's evaluation. He has multiple significant medical problems most serious of which is an ischemic cardiomyopathy with EF of 20-25%, he is status post pacemaker and defibrillator. He has history of coronary artery disease has had several stents and is maintained on Plavix. He has not had any intervention in the past year. He has been seen both at [facility] and by doctors Caryl Comes and [place]. He has  A chronic pain syndrome with chronic back pain and is narcotic dependent also Benzo  Dependent. He has spinal stenosis with neurogenic claudication. He has also been diagnosed with a complex sleep apnea and is just starting on nighttime oxygen therapy for hypoxia. There is no family history of colon cancer that he is aware of, question grandfather. He has a nephew  with Crohn's    Review of Systems  Constitutional: Negative.   HENT: Negative.   Eyes: Negative.   Respiratory: Negative.   Cardiovascular: Negative.   Gastrointestinal: Positive for constipation.  Endocrine: Negative.   Genitourinary: Negative.   Musculoskeletal: Positive for back pain.  Skin: Negative.   Neurological: Negative.   Hematological: Negative.   Psychiatric/Behavioral: Negative.    Outpatient Prescriptions Prior to Visit  Medication Sig Dispense Refill  . alfuzosin (UROXATRAL) 10 MG 24 hr tablet as needed.       Marland Kitchen amoxicillin-clavulanate (AUGMENTIN) 500-125 MG per tablet Take 0.5 tablets by mouth 2 (two) times daily.       Marland Kitchen aspirin 81 MG tablet Take 325 mg by mouth daily.       . beclomethasone (QVAR) 40 MCG/ACT inhaler Inhale 1 puff into the lungs 2 (two) times daily.  8.7 g  6  . carvedilol (COREG) 25 MG tablet Take 25 mg by mouth 2 (two) times daily.       . clopidogrel (PLAVIX) 75 MG tablet TAKE 1 TABLET EACH DAY.  30 tablet  3  . CRESTOR 40 MG tablet TAKE 1 TABLET DAILY.  30 tablet  11  . diazepam (VALIUM) 10 MG tablet Take 10 mg by mouth 2 (two) times daily.       Marland Kitchen ezetimibe (ZETIA) 10 MG tablet Take 1 tablet (10 mg total) by mouth daily.  30 tablet  6  . lactulose (CHRONULAC) 10  GM/15ML solution Take 30 mLs (20 g total) by mouth 2 (two) times daily. For constipation  500 mL  4  . levofloxacin (LEVAQUIN) 500 MG tablet Take 1 tablet (500 mg total) by mouth daily.  7 tablet  0  . lisinopril (PRINIVIL,ZESTRIL) 10 MG tablet TAKE 1 TABLET ONCE DAILY.  30 tablet  11  . LORazepam (ATIVAN) 1 MG tablet Take 1 mg by mouth 4 (four) times daily as needed. For anxiety      . morphine (MS CONTIN) 100 MG 12 hr tablet Take 1 tablet (100 mg total) by mouth 3 (three) times daily.  90 tablet  0  . morphine (MSIR) 30 MG tablet Take 1 tablet (30 mg total) by mouth every 6 (six) hours as needed for severe pain.  120 tablet  0  . nitroGLYCERIN (NITROSTAT) 0.4 MG SL tablet Place 0.4  mg under the tongue every 5 (five) minutes as needed for chest pain.      . polyethylene glycol powder (GLYCOLAX/MIRALAX) powder Take 17 g by mouth 2 (two) times daily as needed.  255 g  1  . sildenafil (VIAGRA) 100 MG tablet Take 1 tablet (100 mg total) by mouth as needed for erectile dysfunction.  10 tablet  6  . spironolactone (ALDACTONE) 25 MG tablet TAKE (1/2) TABLET DAILY.  15 tablet  5  . testosterone cypionate (DEPOTESTOTERONE CYPIONATE) 200 MG/ML injection Inject into the muscle every 14 (fourteen) days.        Marland Kitchen torsemide (DEMADEX) 20 MG tablet Take 1 tablet (20 mg total) by mouth daily as needed. For swelling in legs  30 tablet  6  . traZODone (DESYREL) 100 MG tablet Take 100 mg by mouth at bedtime as needed.       . zolpidem (AMBIEN) 10 MG tablet Take 10 mg by mouth at bedtime as needed. For sleep      . Zolpidem Tartrate (INTERMEZZO) 3.5 MG SUBL Place under the tongue as needed.       No facility-administered medications prior to visit.   Allergies  Allergen Reactions  . Darvocet [Propoxyphene N-Acetaminophen] Anaphylaxis    Throat closes   Patient Active Problem List   Diagnosis Date Noted  . Sinusitis, acute 04/11/2014  . Therapeutic opioid induced constipation 04/02/2014  . Decreased body weight 11/08/2013  . Hypoxemia 09/25/2013  . Polycythemia 09/18/2013  . Erectile dysfunction 11/24/2012  . 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 MAI (mycobacterium avium-intracellulare) 05/16/2012  . 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 Pre-operative cardiovascular examination 11/21/2011  . 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 PAD (peripheral artery disease) 02/24/2011  . ICD-St.Jude 12/24/2010  . Depression 12/09/2010  . Tobacco abuse 12/09/2010  . cHistory of testicular cancer 12/09/2010  . Benzodiazepine dependence 12/09/2010  . Narcotic dependence 12/09/2010  . Chronic back pain 12/05/2010  . Anxiety disorder 10/17/2010  . CAD,  NATIVE VESSEL 09/15/2010  . COMBINED HEART FAILURE, CHRONIC 09/15/2010  . CARDIOMYOPATHY, ISCHEMIC 09/12/2010   History  Substance Use Topics  . Smoking status: Current Some Day Smoker -- 1.50 packs/day for 29 years    Types: Cigarettes, E-cigarettes    Last Attempt to Quit: 03/27/2013  . Smokeless tobacco: Never Used     Comment: PT STOPPED SMOKING CIGS 03/27/13. NOW CURRENTLY USING E-CIG  . Alcohol Use: No   family history includes Heart disease (age of onset: 67) in his father.     Objective:   Physical Exam well-developed middle-aged white male  in no acute distress blood pressure 128/76 pulse 70 height 6 foot 1 weight 224. HEENT; nontraumatic normocephalic EOMI PERRLA sclera anicteric, Supple ;no JVD, Cardiovascular; regular rate and rhythm with G6-Y6 soft systolic murmur, pacemaker/ICD in chest wall Pulmonary; clear bilaterally, Abdomen; soft nontender nondistended bowel sounds are active there is no palpable mass or hepatosplenomegaly bowel sounds are present, Rectal; exam not done, Extremities ;no clubbing cyanosis or edema skin warm dry, Psych; mood and affect appropriate speech somewhat slow        Assessment & Plan:  #31  53 year old white male with chronic constipation secondary to chronic narcotic use. #2 colon neoplasia surveillance-patient has had prior colonoscopy but believes this was greater than 10 years ago. #3 coronary artery disease status post several stents #4 chronic antiplatelet therapy-on Plavix #5 ischemic cardiomyopathy  with EF of 20-25% #6 status post pacemaker and ICD placement #7 chronic narcotic dependence and benzodiazepine dependence #8 complex sleep apnea with nocturnal hypoxemia-starting nighttime 02 #9 history of testicular cancer  Plan; Will give patient a trial of lesions that's 145 mcg by mouth every morning, can titrate to higher dose if this is ineffective Patient is high-risk for complications with sedation for colonoscopy and would not  proceed with colonoscopy until he is cleared from a cardiac standpoint. He would also need to come off Plavix for 5-7 day prior to the procedure. He would like to proceed with colonoscopy however will pursue ColoGuard stool DNA initially and if this is negative would defer colonoscopy. He will make an appointment to see Dr. Missy Sabins  for followup and to discuss Will also obtain his records from Dr. Earlean Shawl and his physician in [place].

## 2014-04-12 NOTE — Patient Instructions (Addendum)
We will notify Dr. Jeffie Pollock about the Plavix medication and requeset his opinion on you having a colonoscopy. Once we hear from him about a regular colonoscopy we will let you know. In the meantime, we will have [employer] you a package with a stool test . We have given you a booklet with the instructions. One you send the stool sample back to [employer], it may take 2-3 weeks for results.   We sent a prescription for Linzess for constipation to [facility].

## 2014-04-13 ENCOUNTER — Other Ambulatory Visit: Payer: Self-pay | Admitting: *Deleted

## 2014-04-13 DIAGNOSIS — Z1211 Encounter for screening for malignant neoplasm of colon: Secondary | ICD-10-CM

## 2014-04-13 NOTE — Progress Notes (Signed)
Reviewed and agree with management. Pt is at higher risk for any endoscopic procedures.  He would need clearance by cardiology before any procedures are undertaken. Sandy Salaam. Deatra Ina, M.D., [facility]

## 2014-04-17 ENCOUNTER — Telehealth: Payer: Self-pay | Admitting: Physician Assistant

## 2014-04-17 NOTE — Telephone Encounter (Signed)
Still constipatd on Linzess 145ug qd. Advised to take 4 doses of miralax today then start Linzess 290ug qd in am

## 2014-04-17 NOTE — Telephone Encounter (Signed)
Spoke with patient and he states he started Linzess 145 mcg daily on 04/12/14. He has not had a bowel movement. He did take a dose of Miralax this AM. No results as of this call. Please, advise.

## 2014-04-23 ENCOUNTER — Telehealth: Payer: Self-pay | Admitting: Internal Medicine

## 2014-04-23 NOTE — Telephone Encounter (Signed)
Patient reports he continues to have issues with constipation despite Linzess being increased to 290 mcg daily He was given MiraLax several days ago which helped but the effect was only transient Previously he was on lactulose 60 mL twice a day He is interested in discussing further management options or different medication I advised that he could use lactulose 30 mL twice a day with Linzess 290 mcg daily for now until further decisions made by Dr. Deatra Ina

## 2014-04-24 ENCOUNTER — Telehealth: Payer: Self-pay | Admitting: Pulmonary Disease

## 2014-04-24 ENCOUNTER — Telehealth: Payer: Self-pay | Admitting: Gastroenterology

## 2014-04-24 DIAGNOSIS — G4731 Primary central sleep apnea: Secondary | ICD-10-CM

## 2014-04-25 NOTE — Telephone Encounter (Signed)
No answer. No voicemail. Instructions sent to patient through "MyChart". This was discussed yesterday.

## 2014-04-27 ENCOUNTER — Other Ambulatory Visit: Payer: Self-pay | Admitting: Internal Medicine

## 2014-04-27 ENCOUNTER — Other Ambulatory Visit: Payer: Self-pay

## 2014-04-28 ENCOUNTER — Other Ambulatory Visit: Payer: Self-pay

## 2014-04-28 MED ORDER — SPIRONOLACTONE 25 MG PO TABS: 12.5000 mg | ORAL_TABLET | Freq: Every day | ORAL | Status: DC

## 2014-04-30 ENCOUNTER — Encounter: Payer: BC Managed Care – PPO | Attending: Physical Medicine & Rehabilitation

## 2014-04-30 ENCOUNTER — Ambulatory Visit (HOSPITAL_BASED_OUTPATIENT_CLINIC_OR_DEPARTMENT_OTHER): Payer: BC Managed Care – PPO | Admitting: Physical Medicine & Rehabilitation

## 2014-04-30 ENCOUNTER — Encounter: Payer: Self-pay | Admitting: Physical Medicine & Rehabilitation

## 2014-04-30 VITALS — BP 121/75 | HR 70 | Resp 14 | Wt 220.6 lb

## 2014-04-30 DIAGNOSIS — M4806 Spinal stenosis, lumbar region: Secondary | ICD-10-CM | POA: Diagnosis present

## 2014-04-30 DIAGNOSIS — K5909 Other constipation: Secondary | ICD-10-CM

## 2014-04-30 DIAGNOSIS — M549 Dorsalgia, unspecified: Secondary | ICD-10-CM

## 2014-04-30 DIAGNOSIS — T402X5A Adverse effect of other opioids, initial encounter: Secondary | ICD-10-CM

## 2014-04-30 DIAGNOSIS — G8929 Other chronic pain: Secondary | ICD-10-CM

## 2014-04-30 DIAGNOSIS — K5903 Drug induced constipation: Secondary | ICD-10-CM

## 2014-04-30 DIAGNOSIS — M48062 Spinal stenosis, lumbar region with neurogenic claudication: Secondary | ICD-10-CM

## 2014-04-30 MED ORDER — MORPHINE SULFATE ER 100 MG PO TBCR: 100.0000 mg | EXTENDED_RELEASE_TABLET | Freq: Three times a day (TID) | ORAL | Status: DC

## 2014-04-30 MED ORDER — MORPHINE SULFATE 30 MG PO TABS: 30.0000 mg | ORAL_TABLET | Freq: Four times a day (QID) | ORAL | Status: DC | PRN

## 2014-04-30 NOTE — Progress Notes (Signed)
Subjective:    Patient ID: CARLA WHILDEN, male    DOB: 1960/08/20, 53 y.o.   MRN: [id number]  HPI Patient has had opioid-induced constipation however went to gastroenterologist and was placed on Linzess Was not effective at 145 mg but after doubling dose had diarrhea  Patient is scheduled for oral surgery, made me aware of this.  Developed diffuse muscle aches, he is worried that he ruptured his tendons  Past medical history significant for sleep apnea follows with pulmonary Pain Inventory Average Pain 8 Pain Right Now 8 My pain is constant, sharp and burning  In the last 24 hours, has pain interfered with the following? General activity 3 Relation with others 3 Enjoyment of life 3 What TIME of day is your pain at its worst? morning and daytime Sleep (in general) Fair  Pain is worse with: walking, bending, standing and some activites Pain improves with: medication Relief from Meds: 5  Mobility walk with assistance use a cane use a walker ability to climb steps?  no do you drive?  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  no  Physicians involved in your care Any changes since last visit?  no   Family History  Problem Relation Age of Onset  . Heart disease Father 51    Died of MI   History   Social History  . Marital Status: Married    Spouse Name: N/A    Number of Children: N/A  . Years of Education: N/A   Social History Main Topics  . Smoking status: Current Some Day Smoker -- 1.50 packs/day for 29 years    Types: Cigarettes, E-cigarettes    Last Attempt to Quit: 03/27/2013  . Smokeless tobacco: Never Used     Comment: PT STOPPED SMOKING CIGS 03/27/13. NOW CURRENTLY USING E-CIG  . Alcohol Use: No  . Drug Use: Yes   Special: Marijuana     Comment: Urine showed THC  . Sexual Activity: None   Other Topics Concern  . None   Social History Narrative  . None   Past Surgical History  Procedure Laterality Date  . Hernia repair    . Asd repair, sinus venosus    . Testicle surgery      testicular cancer surgery  . Cardiac defibrillator placement  08/2010   Past Medical History  Diagnosis Date  . Chronic systolic heart failure     EF 20-25%. s/p ST. Jude ICD  . CAD (coronary artery disease)     Last cath 2/12. 3-v CAD. Failed PCI of distal RCAc  CATH [facility] 4/13 with DES to  LAD X2  . Chronic back pain     lumbar stenosis  . Benign prostatic hypertrophy   . Depression   . History of testicular cancer   . Narcotic dependence     chronic  . Benzodiazepine dependence     chronic  . Anxiety   . CHF (congestive heart failure)   . DJD (degenerative joint disease)   . Automatic implantable cardiac defibrillator St Judes     Analyze ST   BP 121/75  Pulse 70  Resp 14  Wt 220 lb 9.6 oz (100.064 kg)  SpO2 98%  Opioid Risk Score:   Fall Risk Score: High Fall Risk (>13 points) (  previoulsy educated and given handout)  Review of Systems  Respiratory: Positive for apnea.        Resp infections  Gastrointestinal: Positive for constipation.  Musculoskeletal: Positive for arthralgias and gait problem.  Neurological: Positive for weakness and numbness.  Psychiatric/Behavioral: The patient is nervous/anxious.   All other systems reviewed and are negative.      Objective:   Physical Exam  Nursing note and vitals reviewed. Constitutional: He is oriented to person, place, and time. He appears well-developed and well-nourished.  HENT:  Head: Normocephalic and atraumatic.  Eyes: Pupils are equal, round, and reactive to light.  Neurological: He is alert and oriented to person, place, and time.  Psychiatric: He has a normal mood and affect. His speech is delayed and tangential. He is slowed.    Patient without acute distress Patient able to ambulate into office without walker Lumbar spine with reduced range of motion flexion extension lateral patient bending. Negative straight leg raising test Motor strength 5/5 bilateral hip flexor knee extensor ankle dorsiflexor Ambulates forward flexed posture no evidence of toe drag or knee instability       Assessment & Plan:  1. Patient with chronic lumbar stenosis with neurogenic claudication however he is able to ambulate without a walker although he states it is difficult for him. 2. Opioid induced constipation now being managed by gastroenterology. Also scheduled for colonoscopy 3. Has oral surgery planned for dental implants, dental surgeon will prescribe usual postop pain medications on top of his current narcotic analgesics which consist of: Morphine extended release 100 mg 3 times a day Morphine immediate release 30 mg 4 times per day  RTC 1 month nurse practitioner

## 2014-04-30 NOTE — Patient Instructions (Signed)
Follow up GI for bowel issues  If you undergo surgery for oral issues have oral surgeon prescribe his usual post op pain meds on top of morphine

## 2014-05-11 ENCOUNTER — Ambulatory Visit: Payer: BC Managed Care – PPO | Admitting: Adult Health

## 2014-05-14 ENCOUNTER — Ambulatory Visit: Payer: BC Managed Care – PPO | Admitting: Adult Health

## 2014-05-23 LAB — COLOGUARD

## 2014-05-28 ENCOUNTER — Other Ambulatory Visit (HOSPITAL_COMMUNITY): Payer: Self-pay | Admitting: Cardiology

## 2014-05-28 MED ORDER — CLOPIDOGREL BISULFATE 75 MG PO TABS: ORAL_TABLET | ORAL | Status: DC

## 2014-05-29 ENCOUNTER — Encounter: Payer: Self-pay | Admitting: Registered Nurse

## 2014-05-29 ENCOUNTER — Encounter: Payer: BC Managed Care – PPO | Attending: Physical Medicine & Rehabilitation | Admitting: Registered Nurse

## 2014-05-29 VITALS — BP 148/74 | HR 71 | Ht 73.0 in | Wt 227.0 lb

## 2014-05-29 DIAGNOSIS — K5903 Drug induced constipation: Secondary | ICD-10-CM

## 2014-05-29 DIAGNOSIS — K5909 Other constipation: Secondary | ICD-10-CM

## 2014-05-29 DIAGNOSIS — M4806 Spinal stenosis, lumbar region: Secondary | ICD-10-CM

## 2014-05-29 DIAGNOSIS — Z5181 Encounter for therapeutic drug level monitoring: Secondary | ICD-10-CM

## 2014-05-29 DIAGNOSIS — R5381 Other malaise: Secondary | ICD-10-CM | POA: Diagnosis present

## 2014-05-29 DIAGNOSIS — M48062 Spinal stenosis, lumbar region with neurogenic claudication: Secondary | ICD-10-CM

## 2014-05-29 DIAGNOSIS — Z79899 Other long term (current) drug therapy: Secondary | ICD-10-CM

## 2014-05-29 DIAGNOSIS — T402X5A Adverse effect of other opioids, initial encounter: Secondary | ICD-10-CM

## 2014-05-29 MED ORDER — MORPHINE SULFATE 30 MG PO TABS: 30.0000 mg | ORAL_TABLET | Freq: Four times a day (QID) | ORAL | Status: DC | PRN

## 2014-05-29 MED ORDER — MORPHINE SULFATE ER 100 MG PO TBCR: 100.0000 mg | EXTENDED_RELEASE_TABLET | Freq: Three times a day (TID) | ORAL | Status: DC

## 2014-05-29 NOTE — Progress Notes (Signed)
Subjective:    Patient ID: [name], male    DOB: March 18, 1961, 53 y.o.   MRN: [id number]  HPI: Mr. DORIAN DUVAL is a 53 year old male who returns for follow up for chronic pain and medication refill. He says his pain is located in his bilateral lower extremities. He rates his pain 8. He states increase instability with walking, he is using his cadillac walker. Requesting physical therapy, order placed. Walking with his cadillac walker.  He has been following up with [facility] hematologist, cardiologist and pulmonologist.    Pain Inventory Average Pain 8 Pain Right Now 8 My pain is constant, sharp, burning, stabbing, tingling and aching  In the last 24 hours, has pain interfered with the following? General activity 2 Relation with others 3 Enjoyment of life 2 What TIME of day is your pain at its worst? morning, night Sleep (in general) Fair  Pain is worse with: walking, bending, standing and some activites Pain improves with: rest and medication Relief from Meds: 5  Mobility use a walker how many minutes can you walk? 5 ability to climb steps?  yes do you drive?  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  no bone scan x-rays CT/MRI nerve study  Physicians involved in your care Any changes since last visit?  no   Family History  Problem Relation Age of Onset  . Heart disease Father 47    Died of MI   History   Social History  . Marital Status: Married    Spouse Name: N/A    Number of Children: N/A  . Years of Education: N/A   Social History Main Topics  . Smoking status: Current Some Day Smoker -- 1.50 packs/day for 29 years    Types: Cigarettes, E-cigarettes    Last Attempt to Quit: 03/27/2013  . Smokeless tobacco: Never Used     Comment: PT STOPPED SMOKING CIGS 03/27/13.  NOW CURRENTLY USING E-CIG  . Alcohol Use: No  . Drug Use: Yes    Special: Marijuana     Comment: Urine showed THC  . Sexual Activity: None   Other Topics Concern  . None   Social History Narrative   Past Surgical History  Procedure Laterality Date  . Hernia repair    . Asd repair, sinus venosus    . Testicle surgery      testicular cancer surgery  . Cardiac defibrillator placement  08/2010   Past Medical History  Diagnosis Date  . Chronic systolic heart failure     EF 20-25%. s/p ST. Jude ICD  . CAD (coronary artery disease)     Last cath 2/12. 3-v CAD. Failed PCI of distal RCAc  C[facility] 4/13 with DES to  LAD X2  . Chronic back pain     lumbar stenosis  . Benign prostatic hypertrophy   . Depression   . History of testicular cancer   . Narcotic dependence     chronic  . Benzodiazepine dependence     chronic  . Anxiety   . CHF (congestive heart failure)   . DJD (degenerative joint disease)   . Automatic implantable cardiac defibrillator St Judes     Analyze ST   BP 148/74 mmHg  Pulse 71  Ht 6\' 1"  (1.854 m)  Wt 227 lb (102.967 kg)  BMI 29.96  kg/m2  SpO2 97%  Opioid Risk Score:   Fall Risk Score: High Fall Risk (>13 points) (pt has received fall safety documentation) Review of Systems  Respiratory: Positive for apnea.   Cardiovascular: Positive for leg swelling.  Gastrointestinal:       Constipation  Musculoskeletal: Positive for myalgias and joint swelling.  Psychiatric/Behavioral: Positive for dysphoric mood. The patient is nervous/anxious.        Objective:   Physical Exam  Constitutional: He is oriented to person, place, and time. He appears well-developed and well-nourished.  HENT:  Head: Normocephalic and atraumatic.  Neck: Normal range of motion. Neck supple.  Cardiovascular: Normal rate and regular rhythm.   Pulmonary/Chest: Effort normal and breath sounds normal.  Musculoskeletal:  Normal Muscle Bulk and Muscle testing Reveals: Upper  Extremities: Full ROM and Muscle Strength 5/5 Lumbar Paraspinal Tenderness: L-3- L-5 Lower Extremities: Full ROM and Muscle Strength 5/5 Arises from chair with ease Using Cadillac walker for support  Neurological: He is alert and oriented to person, place, and time. Coordination abnormal.  + Romberg  Skin: Skin is warm and dry.  Psychiatric: He has a normal mood and affect.  Nursing note and vitals reviewed.         Assessment & Plan:  1. Lumbar spinal stenosis, severe.  Refilled: MS Contin 100 mg one tablet three times a day #90 and MSIR 30 mg one tablet every 6 hours as needed for sever pain #120.  2. History of shoulder pain left side. Controlled. No complaints today.  3. Sleep apnea : compliant with CPAP  4. Physical Deconditioning: + Romberg/ RX: Physical Therapy 20 minutes of face to face patient care time was spent during this visit. All questions were encouraged and answered.   F/U in 1 month

## 2014-05-30 ENCOUNTER — Telehealth: Payer: Self-pay | Admitting: Physician Assistant

## 2014-05-30 NOTE — Telephone Encounter (Signed)
Reviewed the CT scan with the patient. There is no mention of pancreatitis. He states he received a call from [facility] about it. They are wanting to send a nurse practioner into his home to examine him. I recommended he call the customer service number in his insurance card an ask more questions. No mention of pancreatitis in his chart.

## 2014-06-11 ENCOUNTER — Encounter: Payer: Self-pay | Admitting: Critical Care Medicine

## 2014-06-11 ENCOUNTER — Ambulatory Visit (INDEPENDENT_AMBULATORY_CARE_PROVIDER_SITE_OTHER): Payer: BC Managed Care – PPO | Admitting: Critical Care Medicine

## 2014-06-11 VITALS — BP 112/70 | HR 74 | Ht 72.0 in | Wt 226.0 lb

## 2014-06-11 DIAGNOSIS — G4731 Primary central sleep apnea: Secondary | ICD-10-CM

## 2014-06-11 DIAGNOSIS — G4739 Other sleep apnea: Secondary | ICD-10-CM

## 2014-06-11 DIAGNOSIS — A310 Pulmonary mycobacterial infection: Secondary | ICD-10-CM

## 2014-06-11 DIAGNOSIS — G4733 Obstructive sleep apnea (adult) (pediatric): Secondary | ICD-10-CM

## 2014-06-11 DIAGNOSIS — G4737 Central sleep apnea in conditions classified elsewhere: Secondary | ICD-10-CM

## 2014-06-11 DIAGNOSIS — Z23 Encounter for immunization: Secondary | ICD-10-CM

## 2014-06-11 NOTE — Progress Notes (Signed)
Subjective:    Patient ID: [name], male    DOB: [birth year], 53 y.o.   MRN: [id number]  HPI 53 y.o.M with known hx of OSA.   06/11/2014 Chief Complaint  Patient presents with  . 2 month follow up    SOB, cough, and congestion have improved.  Cough nonprod.    Cough and mucus ok  .  Dyspnea is better.  Pt had some joint issues while on levaquin.   The patient is still taking an exceptional amount of narcotics for pain control  Review of Systems Constitutional:   No  weight loss, night sweats,   Fevers, chills, fatigue, lassitude. HEENT:   No headaches,  Difficulty swallowing,  Tooth/dental problems,  Sore throat,                No sneezing, itching, ear ache, nasal congestion,post nasal drip,   CV:  No chest pain,  Orthopnea, PND, swelling in lower extremities, anasarca, dizziness, palpitations  GI  No heartburn, indigestion, abdominal pain, nausea, vomiting, diarrhea, +++change in bowel habits obstipation, no BM in 7days , loss of appetite  Resp:  .  No chest wall deformity  Skin: no rash or lesions.  GU: no dysuria, change in color of urine, no urgency or frequency.  No flank pain.  MS:  Chronic back pain       Objective:   Physical Exam Filed Vitals:   06/11/14 1108  BP: 112/70  Pulse: 74  Height: 6' (1.829 m)  Weight: 226 lb (102.513 kg)  SpO2: 97%    Gen: Pleasant, well-nourished, in no distress,  normal affect  ENT: No lesions,  mouth clear,  oropharynx clear, no postnasal drip  Neck: No JVD, no TMG, no carotid bruits  Lungs: No use of accessory muscles, no dullness to percussion, no rhonchi  Cardiovascular: RRR, heart sounds normal, no murmur or gallops, no peripheral edema  Abdomen: soft and NT, no HSM,  BS normal  Musculoskeletal: No deformities, no cyanosis or clubbing  Neuro: alert, non focal  Skin: Warm, no lesions or rashes       Assessment & Plan:   Complex sleep apnea syndrome Complex sleep disorder with associated  hypercarbia and hypoxemia and associated lower airway inflammation with prior history of smoking use Stable this time Plan The patient was advised to work with his pain specialist to reduce dose of narcotics as much as possible Continue ASV support at night prevnar 13 and flu vaccine given No change in qvar Return 4 months     Updated Medication List Outpatient Encounter Prescriptions as of 06/11/2014  Medication Sig  . alfuzosin (UROXATRAL) 10 MG 24 hr tablet as needed.   Marland Kitchen aspirin 81 MG tablet Take 81 mg by mouth daily.   . beclomethasone (QVAR) 40 MCG/ACT inhaler Inhale 1 puff into the lungs 2 (two) times daily.  . carvedilol (COREG) 25 MG tablet Take 25 mg by mouth 2 (two) times daily.   . clopidogrel (PLAVIX) 75 MG tablet TAKE 1 TABLET EACH DAY.  Marland Kitchen CRESTOR 40 MG tablet TAKE 1 TABLET DAILY.  . diazepam (VALIUM) 10 MG tablet Take 10 mg by mouth 2 (two) times daily.   Marland Kitchen ezetimibe (ZETIA) 10 MG tablet Take 1 tablet (10 mg total) by mouth daily.  Marland Kitchen lactulose (CHRONULAC) 10 GM/15ML solution Take 30 mLs (20 g total) by mouth 2 (two) times daily. For constipation (Patient taking differently: Take 20 g by mouth 2 (two) times daily as needed. For constipation)  .  Linaclotide (LINZESS) 145 MCG CAPS capsule Take 1 capsule (145 mcg total) by mouth daily.  Marland Kitchen lisinopril (PRINIVIL,ZESTRIL) 10 MG tablet TAKE 1 TABLET ONCE DAILY.  Marland Kitchen LORazepam (ATIVAN) 1 MG tablet Take 1 mg by mouth 4 (four) times daily as needed. For anxiety  . morphine (MS CONTIN) 100 MG 12 hr tablet Take 1 tablet (100 mg total) by mouth 3 (three) times daily.  Marland Kitchen morphine (MSIR) 30 MG tablet Take 1 tablet (30 mg total) by mouth every 6 (six) hours as needed for severe pain.  . nitroGLYCERIN (NITROSTAT) 0.4 MG SL tablet Place 0.4 mg under the tongue every 5 (five) minutes as needed for chest pain.  . sildenafil (VIAGRA) 100 MG tablet Take 1 tablet (100 mg total) by mouth as needed for erectile dysfunction.  Marland Kitchen spironolactone  (ALDACTONE) 25 MG tablet Take 0.5 tablets (12.5 mg total) by mouth daily.  Marland Kitchen testosterone cypionate (DEPOTESTOTERONE CYPIONATE) 200 MG/ML injection Inject into the muscle every 14 (fourteen) days.    Marland Kitchen torsemide (DEMADEX) 20 MG tablet Take 1 tablet (20 mg total) by mouth daily as needed. For swelling in legs  . traZODone (DESYREL) 100 MG tablet Take 100 mg by mouth at bedtime as needed.   . zolpidem (AMBIEN) 10 MG tablet Take 10 mg by mouth at bedtime as needed. For sleep  . Zolpidem Tartrate (INTERMEZZO) 3.5 MG SUBL Place under the tongue as needed.  . [DISCONTINUED] amoxicillin-clavulanate (AUGMENTIN) 500-125 MG per tablet Take 0.5 tablets by mouth 2 (two) times daily.   . [DISCONTINUED] polyethylene glycol powder (GLYCOLAX/MIRALAX) powder Take 17 g by mouth 2 (two) times daily as needed. (Patient not taking: Reported on 06/11/2014)

## 2014-06-11 NOTE — Assessment & Plan Note (Signed)
Complex sleep disorder with associated hypercarbia and hypoxemia and associated lower airway inflammation with prior history of smoking use Stable this time Plan The patient was advised to work with his pain specialist to reduce dose of narcotics as much as possible Continue ASV support at night prevnar 13 and flu vaccine given No change in qvar Return 4 months

## 2014-06-11 NOTE — Patient Instructions (Signed)
prevnar 13 and flu vaccine given No change in qvar Return 4 months

## 2014-06-12 ENCOUNTER — Telehealth: Payer: Self-pay | Admitting: Physician Assistant

## 2014-06-12 NOTE — Telephone Encounter (Signed)
Patient is looking for his results on the cologuard. I called the company and the results have been faxed. Patient advised of this and I will call him asap with the results once they are reviewed.

## 2014-06-14 ENCOUNTER — Encounter (HOSPITAL_COMMUNITY)
Admission: AD | Disposition: A | Discharge status: Home / Self Care | Payer: Self-pay | Source: Ambulatory Visit | Attending: Internal Medicine

## 2014-06-14 ENCOUNTER — Ambulatory Visit (HOSPITAL_COMMUNITY)
Admission: AD | Admit: 2014-06-14 | Discharge: 2014-06-14 | Disposition: A | Discharge status: Home / Self Care | Payer: BC Managed Care – PPO | Source: Ambulatory Visit | Attending: Internal Medicine | Admitting: Internal Medicine

## 2014-06-14 ENCOUNTER — Ambulatory Visit (HOSPITAL_BASED_OUTPATIENT_CLINIC_OR_DEPARTMENT_OTHER)
Admission: RE | Admit: 2014-06-14 | Discharge: 2014-06-14 | Disposition: A | Discharge status: Home / Self Care | Payer: BC Managed Care – PPO | Source: Ambulatory Visit | Attending: Cardiology | Admitting: Cardiology

## 2014-06-14 ENCOUNTER — Other Ambulatory Visit: Payer: Self-pay | Admitting: *Deleted

## 2014-06-14 VITALS — BP 128/82 | HR 66 | Wt 217.5 lb

## 2014-06-14 DIAGNOSIS — R079 Chest pain, unspecified: Secondary | ICD-10-CM | POA: Diagnosis present

## 2014-06-14 DIAGNOSIS — Z9981 Dependence on supplemental oxygen: Secondary | ICD-10-CM | POA: Insufficient documentation

## 2014-06-14 DIAGNOSIS — G4733 Obstructive sleep apnea (adult) (pediatric): Secondary | ICD-10-CM | POA: Diagnosis not present

## 2014-06-14 DIAGNOSIS — F1120 Opioid dependence, uncomplicated: Secondary | ICD-10-CM | POA: Diagnosis not present

## 2014-06-14 DIAGNOSIS — F329 Major depressive disorder, single episode, unspecified: Secondary | ICD-10-CM | POA: Insufficient documentation

## 2014-06-14 DIAGNOSIS — Z79899 Other long term (current) drug therapy: Secondary | ICD-10-CM | POA: Diagnosis not present

## 2014-06-14 DIAGNOSIS — G8929 Other chronic pain: Secondary | ICD-10-CM | POA: Insufficient documentation

## 2014-06-14 DIAGNOSIS — F1920 Other psychoactive substance dependence, uncomplicated: Secondary | ICD-10-CM | POA: Insufficient documentation

## 2014-06-14 DIAGNOSIS — M549 Dorsalgia, unspecified: Secondary | ICD-10-CM | POA: Diagnosis not present

## 2014-06-14 DIAGNOSIS — I509 Heart failure, unspecified: Secondary | ICD-10-CM | POA: Diagnosis not present

## 2014-06-14 DIAGNOSIS — I2510 Atherosclerotic heart disease of native coronary artery without angina pectoris: Secondary | ICD-10-CM | POA: Insufficient documentation

## 2014-06-14 DIAGNOSIS — Z7982 Long term (current) use of aspirin: Secondary | ICD-10-CM | POA: Diagnosis not present

## 2014-06-14 DIAGNOSIS — I200 Unstable angina: Secondary | ICD-10-CM

## 2014-06-14 DIAGNOSIS — Z8547 Personal history of malignant neoplasm of testis: Secondary | ICD-10-CM | POA: Insufficient documentation

## 2014-06-14 DIAGNOSIS — I255 Ischemic cardiomyopathy: Secondary | ICD-10-CM | POA: Diagnosis not present

## 2014-06-14 DIAGNOSIS — J9610 Chronic respiratory failure, unspecified whether with hypoxia or hypercapnia: Secondary | ICD-10-CM | POA: Insufficient documentation

## 2014-06-14 DIAGNOSIS — I5042 Chronic combined systolic (congestive) and diastolic (congestive) heart failure: Secondary | ICD-10-CM

## 2014-06-14 DIAGNOSIS — E669 Obesity, unspecified: Secondary | ICD-10-CM | POA: Insufficient documentation

## 2014-06-14 DIAGNOSIS — I25110 Atherosclerotic heart disease of native coronary artery with unstable angina pectoris: Secondary | ICD-10-CM

## 2014-06-14 HISTORY — PX: LEFT HEART CATHETERIZATION WITH CORONARY ANGIOGRAM: SHX5451

## 2014-06-14 LAB — BASIC METABOLIC PANEL
Anion gap: 12 (ref 5–15)
BUN: 7 mg/dL (ref 6–23)
CO2: 26 mEq/L (ref 19–32)
Calcium: 9.3 mg/dL (ref 8.4–10.5)
Chloride: 96 mEq/L (ref 96–112)
Creatinine, Ser: 0.83 mg/dL (ref 0.50–1.35)
GFR calc Af Amer: >90 mL/min (ref >90)
GFR calc non Af Amer: >90 mL/min (ref >90)
Glucose, Bld: 102 mg/dL — ABNORMAL HIGH (ref 70–99)
Potassium: 4.3 mEq/L (ref 3.7–5.3)
Sodium: 134 mEq/L — ABNORMAL LOW (ref 137–147)

## 2014-06-14 LAB — PROTIME-INR
INR: 1.06 (ref 0.00–1.49)
Prothrombin Time: 13.9 seconds (ref 11.6–15.2)

## 2014-06-14 LAB — CBC
HCT: 48.4 % (ref 39.0–52.0)
Hemoglobin: 17 g/dL (ref 13.0–17.0)
MCH: 33.2 pg (ref 26.0–34.0)
MCHC: 35.1 g/dL (ref 30.0–36.0)
MCV: 94.5 fL (ref 78.0–100.0)
Platelets: 143 10*3/uL — ABNORMAL LOW (ref 150–400)
RBC: 5.12 MIL/uL (ref 4.22–5.81)
RDW: 12.9 % (ref 11.5–15.5)
WBC: 6.4 10*3/uL (ref 4.0–10.5)

## 2014-06-14 SURGERY — LEFT HEART CATHETERIZATION WITH CORONARY ANGIOGRAM
Anesthesia: LOCAL

## 2014-06-14 MED ORDER — LIDOCAINE HCL (PF) 1 % IJ SOLN
INTRAMUSCULAR | Status: AC
  Filled 2014-06-14: qty 30

## 2014-06-14 MED ORDER — NITROGLYCERIN 1 MG/10 ML FOR IR/CATH LAB
INTRA_ARTERIAL | Status: AC
  Filled 2014-06-14: qty 10

## 2014-06-14 MED ORDER — SODIUM CHLORIDE 0.9 % IJ SOLN: 3.0000 mL | Freq: Two times a day (BID) | INTRAMUSCULAR | Status: DC

## 2014-06-14 MED ORDER — SODIUM CHLORIDE 0.9 % IV SOLN
INTRAVENOUS | Status: AC
Start: 2014-06-14 — End: 2014-06-14

## 2014-06-14 MED ORDER — SODIUM CHLORIDE 0.9 % IV SOLN
INTRAVENOUS | Status: DC
  Administered 2014-06-14: 15:00:00 via INTRAVENOUS

## 2014-06-14 MED ORDER — FENTANYL CITRATE 0.05 MG/ML IJ SOLN
INTRAMUSCULAR | Status: AC
  Filled 2014-06-14: qty 2

## 2014-06-14 MED ORDER — HEPARIN SODIUM (PORCINE) 1000 UNIT/ML IJ SOLN
INTRAMUSCULAR | Status: AC
  Filled 2014-06-14: qty 1

## 2014-06-14 MED ORDER — ASPIRIN 81 MG PO CHEW
CHEWABLE_TABLET | ORAL | Status: AC
  Administered 2014-06-14: 81 mg via ORAL
  Filled 2014-06-14: qty 1

## 2014-06-14 MED ORDER — SODIUM CHLORIDE 0.9 % IV SOLN
1.0000 mL/kg/h | INTRAVENOUS | Status: DC
  Administered 2014-06-14: 1 mL/kg/h via INTRAVENOUS

## 2014-06-14 MED ORDER — VERAPAMIL HCL 2.5 MG/ML IV SOLN
INTRAVENOUS | Status: AC
Start: 2014-06-14 — End: 2014-06-14
  Filled 2014-06-14: qty 2

## 2014-06-14 MED ORDER — SODIUM CHLORIDE 0.9 % IJ SOLN: 3.0000 mL | INTRAMUSCULAR | Status: DC | PRN

## 2014-06-14 MED ORDER — MIDAZOLAM HCL 2 MG/2ML IJ SOLN
INTRAMUSCULAR | Status: AC
  Filled 2014-06-14: qty 2

## 2014-06-14 MED ORDER — ASPIRIN 81 MG PO CHEW
81.0000 mg | CHEWABLE_TABLET | ORAL | Status: AC
  Administered 2014-06-14: 81 mg via ORAL

## 2014-06-14 MED ORDER — HEPARIN (PORCINE) IN NACL 2-0.9 UNIT/ML-% IJ SOLN
INTRAMUSCULAR | Status: AC
  Filled 2014-06-14: qty 1500

## 2014-06-14 MED ORDER — SODIUM CHLORIDE 0.9 % IV SOLN: 250.0000 mL | INTRAVENOUS | Status: DC | PRN

## 2014-06-14 NOTE — Interval H&P Note (Signed)
History and Physical Interval Note:  06/14/2014 4:10 PM  [name]  has presented today for surgery, with the diagnosis of cp, cad  The various methods of treatment have been discussed with the patient and family. After consideration of risks, benefits and other options for treatment, the patient has consented to  Procedure(s): LEFT HEART CATHETERIZATION WITH CORONARY ANGIOGRAM (N/A) and possible angioplasty Cath Lab Visit (complete for each Cath Lab visit)  Clinical Evaluation Leading to the Procedure:   ACS: Yes.    Non-ACS:    Anginal Classification: CCS IV  Anti-ischemic medical therapy: Minimal Therapy (1 class of medications)  Non-Invasive Test Results: No non-invasive testing performed  Prior CABG: No previous CABG      as a surgical intervention .  The patient's history has been reviewed, patient examined, no change in status, stable for surgery.  I have reviewed the patient's chart and labs.  Questions were answered to the patient's satisfaction.     Muntaha Vermette

## 2014-06-14 NOTE — CV Procedure (Signed)
Cardiac Cath Procedure Note:  Indication: Unstable angina  Procedures performed:  1) Selective coronary angiography 2) Left heart catheterization 3) Left ventriculogram  Description of procedure:   The risks and indication of the procedure were explained. Consent was signed and placed on the chart. An appropriate timeout was taken prior to the procedure. After a normal Allen's test was confirmed, the right wrist was prepped and draped in the routine sterile fashion and anesthetized with 1% local lidocaine.   A 5 FR arterial sheath was then placed in the right radial artery using a modified Seldinger technique. Systemic heparin was administered. 80m IV verapamil was given through the sheath. Standard catheters including a JL 3.5, JR4 and straight pigtail were used. All catheter exchanges were made over a wire.  Complications:  None apparent  Findings:  Ao Pressure: 125/72 (95)  LV Pressure: 127/5/20 There was no signficant gradient across the aortic valve on pullback.  Left main: Normal  LAD coursed to the apex, gave off a moderate-sized diagonal branch in the midsection. There was mild plaque throughout the proximal LAD. In the mid LAD there was a widely patent stent. Just after the stent there was a 40-50% stenosis. There was mild plaque in the diagonal.  Left circumflex: gave off a ramus branch, small OM-1. The AV groove circumflex was totally occluded in the midsection which was chronic. In the ramus branch, there was evidence of a previously placed stent which is chronically subtotally occluded with faint flow in the distal vessel. In the OM-1, there was a 20% proximal lesion. The OM-1 gave collaterals to a small OM-2  Right coronary artery:was a large dominant vessel, had diffuse 40% disease throughout the proximal and midsection.In the distal RCA just after the takeoff of the PDA, there was a chronic 99% lesion with a near subtotal occlusion.There were left to right  collaterals filling the distal RCA  LV-gram done in the RAO projection: Ejection fraction = 20-25% with akinesis of the inferior wall and global HK elsewhere.   Assessment: 1. Stable CAD 2. 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 Would continue medical therapy for CAD and HF.    Toni Hoffmeister,MD 5:10 PM

## 2014-06-14 NOTE — Progress Notes (Signed)
Patient ID: [name], male   DOB: 12-26-1960, 53 y.o.   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  Last cath 2011 showed:  LVEF 20% LM: ostial 20% LAD: Diffuse  40% prox, mid 60-70% focal lesion.  D1: 30% tubular lesion. LCX: gave off a ramus branch, small OM-1.  The distal AV groove circ was subtotally occluded which was chronic.  In the ramus branch, there was evidence of a previously placed stent.  This was now totally occluded.  In the OM-1, there was a 40% proximal lesion. RCA:  dominant vessel, had diffuse 40-50% disease throughout.  In the distal RCA, prior to the PDA, there was a tubular 40% lesion.  In the distal RCA just after the takeoff of the PDA, there was a 99% lesion with a near subtotal occlusion.  In the PDA, there was a 5 0% tubular lesion distally. Failed PCI of distal RCA. Anatomy not favorable for CABG.  He is s/p St. Jude  ICD implantation. Was enrolled in [employer].   Most recent Echo 11/13; EF 30-35% mild RV dilation.  Has been followed recently by Dr. Stann Mainland at [facility]. Underwent cath at [facility] in 3/13 and had 2.5x68m Xience DES placed in LAD. [facility].Treated medically. SArlyce Harmanstopped in past due to hyperkalemia.  Left main: Normal Left anterior descending: Mild irregularity in the proximal LAD and nicely patent stent in the mid LAD after D-2 Left circumflex: Occluded OM-1 with filling from left collaterals. Right coronary: Dominant vessel with ectasia in the mid-proximal segment. Proximal 30% narrowing  Left ventriculogram: LVEDP: 5 mmHg   He presents for unscheduled  visit for 2 things. 1) clearance for light sedation for oral surgery . 2) on Monday had diarrhea and vomiting with diaphoresis after getting flu shots. + chest pain. Says he slept for 24 hours. Yesterday felt weak. + several episodes of CP.  Said he often feels this way after a cardiac event. Last episode of chest pain this am at 4p. Relieved with NTG. No CP on exertion.    [place] [facility] GCorrin Parker(Pulmonary) and Dr. AAnnabelle Harmanin hematology for polycythemia. Found to hypoxic and have OSA. Started on CPAP and O2 hgb improved. Says he carries a cigarette around a lot but not smoking. He has been under a lot of stress lately. Tells me he has been charged with hit and run for hitting a car but says he never hit a car. Has lost about 40 pounds over past 6 months unintentionally. Continues on morphine for chronic back pain.   ROS: All systems negative except as listed in HPI, PMH and Problem List.  Past Medical History  Diagnosis Date  . Chronic systolic heart failure     EF 20-25%. s/p ST. Jude ICD  . CAD (coronary artery disease)     Last cath 2/12. 3-v CAD. Failed PCI of distal RCAc  CATH [facility] 4/13 with DES to  LAD X2  . Chronic back pain     lumbar stenosis  . Benign prostatic hypertrophy   . Depression   . History of  testicular cancer   . Narcotic dependence     chronic  . Benzodiazepine dependence     chronic  . Anxiety   . CHF (congestive heart failure)   . DJD (degenerative joint disease)   . Automatic implantable cardiac defibrillator St Judes     Analyze ST    Current Outpatient Prescriptions  Medication Sig Dispense Refill  . alfuzosin (UROXATRAL) 10 MG 24 hr tablet as needed.     Marland Kitchen aspirin 325 MG tablet Take 325 mg by mouth daily.    . beclomethasone (QVAR) 40 MCG/ACT inhaler Inhale 1 puff into the lungs 2 (two) times daily. 8.7 g 6  . carvedilol (COREG) 25 MG tablet Take 25 mg by mouth 2 (two) times daily.     . clopidogrel (PLAVIX) 75 MG tablet  TAKE 1 TABLET EACH DAY. 30 tablet 3  . CRESTOR 40 MG tablet TAKE 1 TABLET DAILY. 30 tablet 11  . diazepam (VALIUM) 10 MG tablet Take 10 mg by mouth 2 (two) times daily.     Marland Kitchen ezetimibe (ZETIA) 10 MG tablet Take 1 tablet (10 mg total) by mouth daily. 30 tablet 6  . gabapentin (NEURONTIN) 600 MG tablet Take 600 mg by mouth daily.    Marland Kitchen lactulose (CHRONULAC) 10 GM/15ML solution Take 30 mLs (20 g total) by mouth 2 (two) times daily. For constipation (Patient taking differently: Take 20 g by mouth 2 (two) times daily as needed. For constipation) 500 mL 4  . Linaclotide (LINZESS) 145 MCG CAPS capsule Take 1 capsule (145 mcg total) by mouth daily. 30 capsule 6  . lisinopril (PRINIVIL,ZESTRIL) 10 MG tablet TAKE 1 TABLET ONCE DAILY. 30 tablet 11  . LORazepam (ATIVAN) 1 MG tablet Take 1 mg by mouth 4 (four) times daily as needed. For anxiety    . morphine (MS CONTIN) 100 MG 12 hr tablet Take 1 tablet (100 mg total) by mouth 3 (three) times daily. 90 tablet 0  . morphine (MSIR) 30 MG tablet Take 1 tablet (30 mg total) by mouth every 6 (six) hours as needed for severe pain. 120 tablet 0  . nitroGLYCERIN (NITROSTAT) 0.4 MG SL tablet Place 0.4 mg under the tongue every 5 (five) minutes as needed for chest pain.    . sildenafil (VIAGRA) 100 MG tablet Take 1 tablet (100 mg total) by mouth as needed for erectile dysfunction. 10 tablet 6  . spironolactone (ALDACTONE) 25 MG tablet Take 0.5 tablets (12.5 mg total) by mouth daily. 15 tablet 5  . testosterone cypionate (DEPOTESTOTERONE CYPIONATE) 200 MG/ML injection Inject into the muscle every 14 (fourteen) days.      Marland Kitchen torsemide (DEMADEX) 20 MG tablet Take 1 tablet (20 mg total) by mouth daily as needed. For swelling in legs 30 tablet 6  . traZODone (DESYREL) 100 MG tablet Take 100 mg by mouth at bedtime as needed.     . zolpidem (AMBIEN) 10 MG tablet Take 10 mg by mouth at bedtime as needed. For sleep    . Zolpidem Tartrate (INTERMEZZO) 3.5 MG SUBL Place under the  tongue as needed.     No current facility-administered medications for this encounter.    PHYSICAL EXAM: Filed Vitals:   06/14/14 1227  BP: 128/82  Pulse: 66    General:  Normal. No acute distress.  HEENT: normal Neck: supple. JVP flat. Carotids 2+ bilaterally; no bruits. No lymphadenopathy or thryomegaly appreciated. Cor: PMI normal. Regular rate & rhythm. No rubs, gallops, murmur Lungs: Clear Abdomen: obese soft, nontender,  nondistended. No hepatosplenomegaly. No bruits or masses. Good bowel sounds. Extremities: no cyanosis, clubbing, rash, edema Neuro: alert & oriented x 3, cranial nerves grossly intact. Moves all 4 extremities w/o difficulty. Affect pleasant.  ECG: NSR 66. ? Small high lateral qs. No ST-T wave abnormalities.   ASSESSMENT & PLAN: 1. CP concerning for [place] 2. CAD with ischemic CM 3. Chronic systolic HF EF 44-96% 4. Severe back pain 5. Chronic respiratory failure on home O2 6. OSA on CPAP  CP is concerning for unstable angina. ECG non-acute. Stress test not likely to be helpful due to chronically occlude LCX. Will plan cardiac cath today to further evaluate. Surgical clearance pending results of cath. HF currently well compensated and followed yearly with Dr. Stann Mainland at [facility].  Daniel Bensimhon,MD 1:15 PM

## 2014-06-14 NOTE — H&P (View-Only) (Signed)
Patient ID: [name], male   DOB: 1960-10-08, 53 y.o.   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  Last cath 2011 showed:  LVEF 20% LM: ostial 20% LAD: Diffuse  40% prox, mid 60-70% focal lesion.  D1: 30% tubular lesion. LCX: gave off a ramus branch, small OM-1.  The distal AV groove circ was subtotally occluded which was chronic.  In the ramus branch, there was evidence of a previously placed stent.  This was now totally occluded.  In the OM-1, there was a 40% proximal lesion. RCA:  dominant vessel, had diffuse 40-50% disease throughout.  In the distal RCA, prior to the PDA, there was a tubular 40% lesion.  In the distal RCA just after the takeoff of the PDA, there was a 99% lesion with a near subtotal occlusion.  In the PDA, there was a 5 0% tubular lesion distally. Failed PCI of distal RCA. Anatomy not favorable for CABG.  He is s/p St. Jude  ICD implantation. Was enrolled in [employer].   Most recent Echo 11/13; EF 30-35% mild RV dilation.  Has been followed recently by Dr. Stann Mainland at [facility]. Underwent cath at [facility] in 3/13 and had 2.5x10m Xience DES placed in LAD. [facility].Treated medically. SArlyce Harmanstopped in past due to hyperkalemia.  Left main: Normal Left anterior descending: Mild irregularity in the proximal LAD and nicely patent stent in the mid LAD after D-2 Left circumflex: Occluded OM-1 with filling from left collaterals. Right coronary: Dominant vessel with ectasia in the mid-proximal segment. Proximal 30% narrowing  Left ventriculogram: LVEDP: 5 mmHg   He presents for unscheduled  visit for 2 things. 1) clearance for light sedation for oral surgery . 2) on Monday had diarrhea and vomiting with diaphoresis after getting flu shots. + chest pain. Says he slept for 24 hours. Yesterday felt weak. + several episodes of CP.  Said he often feels this way after a cardiac event. Last episode of chest pain this am at 4p. Relieved with NTG. No CP on exertion.    [place] [facility] [place] GCorrin Parker(Pulmonary) and Dr. AAnnabelle Harmanin hematology for polycythemia. Found to hypoxic and have OSA. Started on CPAP and O2 hgb improved. Says he carries a cigarette around a lot but not smoking. He has been under a lot of stress lately. Tells me he has been charged with hit and run for hitting a car but says he never hit a car. Has lost about 40 pounds over past 6 months unintentionally. Continues on morphine for chronic back pain.   ROS: All systems negative except as listed in HPI, PMH and Problem List.  Past Medical History  Diagnosis Date  . Chronic systolic heart failure     EF 20-25%. s/p ST. Jude ICD  . CAD (coronary artery disease)     Last cath 2/12. 3-v CAD. Failed PCI of distal RCAc  C[facility] 4/13 with DES to  LAD X2  . Chronic back pain     lumbar stenosis  . Benign prostatic hypertrophy   . Depression   . History of  testicular cancer   . Narcotic dependence     chronic  . Benzodiazepine dependence     chronic  . Anxiety   . CHF (congestive heart failure)   . DJD (degenerative joint disease)   . Automatic implantable cardiac defibrillator St Judes     Analyze ST    Current Outpatient Prescriptions  Medication Sig Dispense Refill  . alfuzosin (UROXATRAL) 10 MG 24 hr tablet as needed.     Marland Kitchen aspirin 325 MG tablet Take 325 mg by mouth daily.    . beclomethasone (QVAR) 40 MCG/ACT inhaler Inhale 1 puff into the lungs 2 (two) times daily. 8.7 g 6  . carvedilol (COREG) 25 MG tablet Take 25 mg by mouth 2 (two) times daily.     . clopidogrel (PLAVIX) 75 MG tablet  TAKE 1 TABLET EACH DAY. 30 tablet 3  . CRESTOR 40 MG tablet TAKE 1 TABLET DAILY. 30 tablet 11  . diazepam (VALIUM) 10 MG tablet Take 10 mg by mouth 2 (two) times daily.     Marland Kitchen ezetimibe (ZETIA) 10 MG tablet Take 1 tablet (10 mg total) by mouth daily. 30 tablet 6  . gabapentin (NEURONTIN) 600 MG tablet Take 600 mg by mouth daily.    Marland Kitchen lactulose (CHRONULAC) 10 GM/15ML solution Take 30 mLs (20 g total) by mouth 2 (two) times daily. For constipation (Patient taking differently: Take 20 g by mouth 2 (two) times daily as needed. For constipation) 500 mL 4  . Linaclotide (LINZESS) 145 MCG CAPS capsule Take 1 capsule (145 mcg total) by mouth daily. 30 capsule 6  . lisinopril (PRINIVIL,ZESTRIL) 10 MG tablet TAKE 1 TABLET ONCE DAILY. 30 tablet 11  . LORazepam (ATIVAN) 1 MG tablet Take 1 mg by mouth 4 (four) times daily as needed. For anxiety    . morphine (MS CONTIN) 100 MG 12 hr tablet Take 1 tablet (100 mg total) by mouth 3 (three) times daily. 90 tablet 0  . morphine (MSIR) 30 MG tablet Take 1 tablet (30 mg total) by mouth every 6 (six) hours as needed for severe pain. 120 tablet 0  . nitroGLYCERIN (NITROSTAT) 0.4 MG SL tablet Place 0.4 mg under the tongue every 5 (five) minutes as needed for chest pain.    . sildenafil (VIAGRA) 100 MG tablet Take 1 tablet (100 mg total) by mouth as needed for erectile dysfunction. 10 tablet 6  . spironolactone (ALDACTONE) 25 MG tablet Take 0.5 tablets (12.5 mg total) by mouth daily. 15 tablet 5  . testosterone cypionate (DEPOTESTOTERONE CYPIONATE) 200 MG/ML injection Inject into the muscle every 14 (fourteen) days.      Marland Kitchen torsemide (DEMADEX) 20 MG tablet Take 1 tablet (20 mg total) by mouth daily as needed. For swelling in legs 30 tablet 6  . traZODone (DESYREL) 100 MG tablet Take 100 mg by mouth at bedtime as needed.     . zolpidem (AMBIEN) 10 MG tablet Take 10 mg by mouth at bedtime as needed. For sleep    . Zolpidem Tartrate (INTERMEZZO) 3.5 MG SUBL Place under the  tongue as needed.     No current facility-administered medications for this encounter.    PHYSICAL EXAM: Filed Vitals:   06/14/14 1227  BP: 128/82  Pulse: 66    General:  Normal. No acute distress.  HEENT: normal Neck: supple. JVP flat. Carotids 2+ bilaterally; no bruits. No lymphadenopathy or thryomegaly appreciated. Cor: PMI normal. Regular rate & rhythm. No rubs, gallops, murmur Lungs: Clear Abdomen: obese soft, nontender,  nondistended. No hepatosplenomegaly. No bruits or masses. Good bowel sounds. Extremities: no cyanosis, clubbing, rash, edema Neuro: alert & oriented x 3, cranial nerves grossly intact. Moves all 4 extremities w/o difficulty. Affect pleasant.  ECG: NSR 66. ? Small high lateral qs. No ST-T wave abnormalities.   ASSESSMENT & PLAN: 1. CP concerning for [place] 2. CAD with ischemic CM 3. Chronic systolic HF EF 83-67% 4. Severe back pain 5. Chronic respiratory failure on home O2 6. OSA on CPAP  CP is concerning for unstable angina. ECG non-acute. Stress test not likely to be helpful due to chronically occlude LCX. Will plan cardiac cath today to further evaluate. Surgical clearance pending results of cath. HF currently well compensated and followed yearly with Dr. Stann Mainland at [facility].  Ary Rudnick,MD 1:15 PM

## 2014-06-14 NOTE — Discharge Instructions (Signed)
Radial Site Care °Refer to this sheet in the next few weeks. These instructions provide you with information on caring for yourself after your procedure. Your caregiver may also give you more specific instructions. Your treatment has been planned according to current medical practices, but problems sometimes occur. Call your caregiver if you have any problems or questions after your procedure. °HOME CARE INSTRUCTIONS °· You may shower the day after the procedure. Remove the bandage (dressing) and gently wash the site with plain soap and water. Gently pat the site dry. °· Do not apply powder or lotion to the site. °· Do not submerge the affected site in water for 3 to 5 days. °· Inspect the site at least twice daily. °· Do not flex or bend the affected arm for 24 hours. °· No lifting over 5 pounds (2.3 kg) for 5 days after your procedure. °· Do not drive home if you are discharged the same day of the procedure. Have someone else drive you. °· You may drive 24 hours after the procedure unless otherwise instructed by your caregiver. °· Do not operate machinery or power tools for 24 hours. °· A responsible adult should be with you for the first 24 hours after you arrive home. °What to expect: °· Any bruising will usually fade within 1 to 2 weeks. °· Blood that collects in the tissue (hematoma) may be painful to the touch. It should usually decrease in size and tenderness within 1 to 2 weeks. °SEEK IMMEDIATE MEDICAL CARE IF: °· You have unusual pain at the radial site. °· You have redness, warmth, swelling, or pain at the radial site. °· You have drainage (other than a small amount of blood on the dressing). °· You have chills. °· You have a fever or persistent symptoms for more than 72 hours. °· You have a fever and your symptoms suddenly get worse. °· Your arm becomes pale, cool, tingly, or numb. °· You have heavy bleeding from the site. Hold pressure on the site. °Document Released: 08/01/2010 Document Revised:  09/21/2011 Document Reviewed: 08/01/2010 °ExitCare® Patient Information ©2015 [employer]. This information is not intended to replace advice given to you by your health care provider. Make sure you discuss any questions you have with your health care provider. ° °

## 2014-06-15 NOTE — Addendum Note (Signed)
Encounter addended by: Vanessa Barbara, CCT on: 06/15/2014 12:05 PM<BR>     Documentation filed: Charges VN

## 2014-06-21 ENCOUNTER — Encounter (HOSPITAL_COMMUNITY): Payer: Self-pay | Admitting: Internal Medicine

## 2014-06-26 ENCOUNTER — Encounter: Payer: BC Managed Care – PPO | Attending: Physical Medicine & Rehabilitation | Admitting: Registered Nurse

## 2014-06-26 ENCOUNTER — Encounter: Payer: Self-pay | Admitting: Registered Nurse

## 2014-06-26 ENCOUNTER — Other Ambulatory Visit (HOSPITAL_COMMUNITY): Payer: Self-pay | Admitting: Internal Medicine

## 2014-06-26 VITALS — BP 136/65 | HR 73 | Resp 14 | Ht 73.0 in | Wt 212.0 lb

## 2014-06-26 DIAGNOSIS — F1920 Other psychoactive substance dependence, uncomplicated: Secondary | ICD-10-CM

## 2014-06-26 DIAGNOSIS — F1120 Opioid dependence, uncomplicated: Secondary | ICD-10-CM

## 2014-06-26 DIAGNOSIS — G894 Chronic pain syndrome: Secondary | ICD-10-CM

## 2014-06-26 DIAGNOSIS — Z79899 Other long term (current) drug therapy: Secondary | ICD-10-CM

## 2014-06-26 DIAGNOSIS — M4806 Spinal stenosis, lumbar region: Secondary | ICD-10-CM

## 2014-06-26 DIAGNOSIS — R5381 Other malaise: Secondary | ICD-10-CM | POA: Insufficient documentation

## 2014-06-26 DIAGNOSIS — Z5181 Encounter for therapeutic drug level monitoring: Secondary | ICD-10-CM

## 2014-06-26 DIAGNOSIS — K5909 Other constipation: Secondary | ICD-10-CM

## 2014-06-26 DIAGNOSIS — M48062 Spinal stenosis, lumbar region with neurogenic claudication: Secondary | ICD-10-CM

## 2014-06-26 DIAGNOSIS — T402X5A Adverse effect of other opioids, initial encounter: Secondary | ICD-10-CM

## 2014-06-26 DIAGNOSIS — K5903 Drug induced constipation: Secondary | ICD-10-CM

## 2014-06-26 MED ORDER — MORPHINE SULFATE ER 100 MG PO TBCR: 100.0000 mg | EXTENDED_RELEASE_TABLET | Freq: Three times a day (TID) | ORAL | Status: DC

## 2014-06-26 MED ORDER — MORPHINE SULFATE 30 MG PO TABS: 30.0000 mg | ORAL_TABLET | Freq: Four times a day (QID) | ORAL | Status: DC | PRN

## 2014-06-26 NOTE — Progress Notes (Signed)
Subjective:    Patient ID: [name], male    DOB: [birth year], 53 y.o.   MRN: [id number]  HPI: [name] is a 53 year old male who returns for follow up for chronic pain and medication refill. He says his pain is located in his lower back and bilateral lower extremities. He rates his pain 8. His current exercise regimed is walking. He's awaiting approval for  physical therapy. Office staff will [employer] company for approval.  Walking with his cadillac walker. He had a Left heart cath on 06/14/2014 at [facility] for clearance for light sedation for oral surgery. He's schedule for several teeth extraction and grafting with cadaver bone on December 17,2015 at  [facility] with Dr. Leron Croak Maxillofacial Surgeon.  He has been following up with [facility] hematologist, cardiologist and pulmonologist   Pain Inventory Average Pain 8 Pain Right Now 8 My pain is sharp, burning, dull and stabbing  In the last 24 hours, has pain interfered with the following? General activity 4 Relation with others 3 Enjoyment of life 2 What TIME of day is your pain at its worst? all Sleep (in general) Good  Pain is worse with: walking, bending, standing and some activites Pain improves with: medication Relief from Meds: 7  Mobility walk with assistance use a cane use a walker ability to climb steps?  no do you drive?  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  no  Physicians involved in your care Any changes since last visit?  no   Family History  Problem Relation Age of Onset  . Heart disease Father 27    Died of MI   History   Social History  . Marital Status: Married    Spouse Name: N/A    Number of Children: N/A  . Years of Education: N/A    Social History Main Topics  . Smoking status: Current Some Day Smoker -- 1.50 packs/day for 29 years    Types: Cigarettes, E-cigarettes    Last Attempt to Quit: 03/27/2013  . Smokeless tobacco: Never Used     Comment: PT STOPPED SMOKING CIGS 03/27/13. NOW CURRENTLY USING E-CIG  . Alcohol Use: No  . Drug Use: Yes    Special: Marijuana     Comment: Urine showed THC  . Sexual Activity: None   Other Topics Concern  . None   Social History Narrative   Past Surgical History  Procedure Laterality Date  . Hernia repair    . Asd repair, sinus venosus    . Testicle surgery      testicular cancer surgery  . Cardiac defibrillator placement  08/2010  . Left heart catheterization with coronary angiogram N/A 06/14/2014    Procedure: LEFT HEART CATHETERIZATION WITH CORONARY ANGIOGRAM;  Surgeon: Jolaine Artist, MD;  Location: [facility] CATH LAB;  Service: Cardiovascular;  Laterality: N/A;   Past Medical History  Diagnosis Date  . Chronic systolic heart failure     EF 20-25%. s/p ST. Jude ICD  . CAD (coronary artery disease)     Last cath 2/12. 3-v CAD. Failed PCI of distal RCAc  CATH [facility] 4/13 with DES to  LAD X2  . Chronic back pain     lumbar stenosis  . Benign prostatic hypertrophy   .  Depression   . History of testicular cancer   . Narcotic dependence     chronic  . Benzodiazepine dependence     chronic  . Anxiety   . CHF (congestive heart failure)   . DJD (degenerative joint disease)   . Automatic implantable cardiac defibrillator [employer]     Analyze ST   BP 136/65 mmHg  Pulse 73  Resp 14  Ht 6\' 1"  (1.854 m)  Wt 212 lb (96.163 kg)  BMI 27.98 kg/m2  SpO2 99%  Opioid Risk Score:   Fall Risk Score: Moderate Fall Risk (6-13 points) (pt rec'd pamphlet  during previous visit) Review of Systems  Constitutional: Positive for unexpected weight change.       Weight loss   Respiratory: Positive for apnea.   Gastrointestinal: Positive for constipation.  Musculoskeletal:  Positive for gait problem.  Neurological: Positive for numbness.       Spasms  Hematological: Bruises/bleeds easily.  Psychiatric/Behavioral: The patient is nervous/anxious.   All other systems reviewed and are negative.      Objective:   Physical Exam  Constitutional: He is oriented to person, place, and time. He appears well-developed and well-nourished.  HENT:  Head: Normocephalic and atraumatic.  Neck: Normal range of motion. Neck supple.  Cardiovascular: Normal rate and regular rhythm.   Pulmonary/Chest: Effort normal and breath sounds normal.  Musculoskeletal:  Normal Muscle Bulk and Muscle Testing Reveals: Upper Extremities: Full ROM and Muscle Strength 5/5 Spinal Forward Flexion 45 Degrees and Extension 10 Degrees Lumbar Paraspinal Tenderness: L-3- L-5 Lower Extremities: Full ROM and Muscle strength 5/5 Arises from chair with ease/ Using cadillac walker for support Narrow based Gait   Neurological: He is alert and oriented to person, place, and time.  Skin: Skin is warm and dry.  Psychiatric: He has a normal mood and affect.  Nursing note and vitals reviewed.         Assessment & Plan:  1. Lumbar spinal stenosis, severe.  Refilled: MS Contin 100 mg one tablet three times a day #90 and MSIR 30 mg one tablet every 6 hours as needed for sever pain #120.  2. History of shoulder pain left side. Controlled. No complaints today.  3. Sleep apnea : compliant with CPAP  4. Physical Deconditioning: Awaiting approval of  Physical Therapy 5. Constipation: Continue Linzess 20 minutes of face to face patient care time was spent during this visit. All questions were encouraged and answered.   F/U in 1 month

## 2014-06-27 ENCOUNTER — Other Ambulatory Visit: Payer: Self-pay | Admitting: Physical Medicine & Rehabilitation

## 2014-06-28 LAB — PMP ALCOHOL METABOLITE (ETG)

## 2014-07-02 ENCOUNTER — Telehealth: Payer: Self-pay | Admitting: Pulmonary Disease

## 2014-07-02 ENCOUNTER — Telehealth: Payer: Self-pay | Admitting: Critical Care Medicine

## 2014-07-02 NOTE — Telephone Encounter (Signed)
Called and spoke to pt. Pt is questioning if he can have a portable O2 machine (when using CPAP) to allow pt to nap through out his house or on the road. Pt stated he has a large house and is unable to use O2 concentrator when napping on couch. Pt stated he is ok to pay out of pocket if insurance does not cover.   Dr. Gwenette Greet please advise of any recommendations.

## 2014-07-02 NOTE — Telephone Encounter (Signed)
Error

## 2014-07-03 LAB — OXYCODONE, URINE (LC/MS-MS)
Noroxycodone, Ur: NEGATIVE ng/mL (ref <50)
Oxycodone, ur: NEGATIVE ng/mL (ref <50)
Oxymorphone: NEGATIVE ng/mL (ref <50)

## 2014-07-03 LAB — PRESCRIPTION MONITORING PROFILE (SOLSTAS)
Amphetamine/Meth: NEGATIVE ng/mL
Barbiturate Screen, Urine: NEGATIVE ng/mL
Buprenorphine, Urine: NEGATIVE ng/mL
Cannabinoid Scrn, Ur: NEGATIVE ng/mL
Carisoprodol, Urine: NEGATIVE ng/mL
Cocaine Metabolites: NEGATIVE ng/mL
Creatinine, Urine: 137.11 mg/dL (ref >20.0)
Fentanyl, Ur: NEGATIVE ng/mL
MDMA URINE: NEGATIVE ng/mL
Meperidine, Ur: NEGATIVE ng/mL
Methadone Screen, Urine: NEGATIVE ng/mL
Nitrites, Initial: NEGATIVE ug/mL
Propoxyphene: NEGATIVE ng/mL
Tapentadol, urine: NEGATIVE ng/mL
Tramadol Scrn, Ur: NEGATIVE ng/mL
Zolpidem, Urine: NEGATIVE ng/mL
pH, Initial: 7 pH (ref 4.5–8.9)

## 2014-07-03 LAB — BENZODIAZEPINES (GC/LC/MS), URINE
Alprazolam metabolite (GC/LC/MS), ur confirm: NEGATIVE ng/mL (ref <25)
Clonazepam metabolite (GC/LC/MS), ur confirm: NEGATIVE ng/mL (ref <25)
Flurazepam metabolite (GC/LC/MS), ur confirm: NEGATIVE ng/mL (ref <50)
Lorazepam (GC/LC/MS), ur confirm: 1543 ng/mL (ref <50)
Midazolam (GC/LC/MS), ur confirm: NEGATIVE ng/mL (ref <50)
Nordiazepam (GC/LC/MS), ur confirm: NEGATIVE ng/mL — AB (ref <50)
Oxazepam (GC/LC/MS), ur confirm: NEGATIVE ng/mL — AB (ref <50)
Temazepam (GC/LC/MS), ur confirm: NEGATIVE ng/mL — AB (ref <50)
Triazolam metabolite (GC/LC/MS), ur confirm: NEGATIVE ng/mL (ref <50)

## 2014-07-03 LAB — ETHYL GLUCURONIDE, URINE
Ethyl Glucuronide (EtG): NEGATIVE ng/mL (ref <500)
Ethyl Sulfate (ETS): NEGATIVE ng/mL (ref <100)

## 2014-07-03 LAB — OPIATES/OPIOIDS (LC/MS-MS)
Codeine Urine: 166 ng/mL — AB (ref <50)
Hydrocodone: NEGATIVE ng/mL (ref <50)
Hydromorphone: NEGATIVE ng/mL — AB (ref <50)
Morphine Urine: >75000 ng/mL (ref <50)
Norhydrocodone, Ur: NEGATIVE ng/mL (ref <50)
Noroxycodone, Ur: NEGATIVE ng/mL (ref <50)
Oxycodone, ur: NEGATIVE ng/mL (ref <50)
Oxymorphone: NEGATIVE ng/mL (ref <50)

## 2014-07-03 NOTE — Telephone Encounter (Signed)
Called and spoke to pt. Informed pt of the recs per [employer]. Pt verbalized understanding and denied any further questions or concerns at this time.

## 2014-07-03 NOTE — Telephone Encounter (Signed)
He would have to have something with CONTINUOUS FLOW.  That would either be bottles, liquid oxygen, or a POC with CONTINUOUS FLOW.   If he wants to pay out of pocket, that is fine.  Will order it with just sleep. He just needs to speak with DME and see what he wants to use.

## 2014-07-09 NOTE — Progress Notes (Addendum)
Urine drug screen for this encounter is inconsistent due to positive for codeine which is not prescribed, negative for diazepam which was reported taken 06/26/14 and ambien reported 06/25/14 not present. See telephone message dated 07/18/14 conferring with [facility] toxicology. Poppy seed bagels would not cause a positive codeine quantity on UDS.

## 2014-07-11 ENCOUNTER — Encounter: Payer: Self-pay | Admitting: Gastroenterology

## 2014-07-16 ENCOUNTER — Telehealth: Payer: Self-pay | Admitting: Registered Nurse

## 2014-07-18 NOTE — Telephone Encounter (Signed)
A positive codeine on UDS would not result from ingesting poppy seed bagels per Cecilie Lowers in toxicology for [employer].

## 2014-07-18 NOTE — Telephone Encounter (Signed)
I spoke with Cecilie Lowers, toxicologist with [facility] about the possibility of positive codeine on UDS as a result of ingesting poppy seed bagel every morning.  He says it is possible to trigger a positive opiate screen but he will have to do some further research to get answer on codeine. Triggering a positive does not equate quantified quantities. We will wait for response.

## 2014-07-24 ENCOUNTER — Encounter: Payer: BLUE CROSS/BLUE SHIELD | Attending: Physical Medicine & Rehabilitation | Admitting: Registered Nurse

## 2014-07-24 ENCOUNTER — Encounter: Payer: Self-pay | Admitting: Registered Nurse

## 2014-07-24 ENCOUNTER — Telehealth: Payer: Self-pay | Admitting: *Deleted

## 2014-07-24 VITALS — BP 138/87 | HR 84 | Resp 14

## 2014-07-24 DIAGNOSIS — F1120 Opioid dependence, uncomplicated: Secondary | ICD-10-CM

## 2014-07-24 DIAGNOSIS — Z5181 Encounter for therapeutic drug level monitoring: Secondary | ICD-10-CM

## 2014-07-24 DIAGNOSIS — R5381 Other malaise: Secondary | ICD-10-CM | POA: Diagnosis present

## 2014-07-24 DIAGNOSIS — T402X5A Adverse effect of other opioids, initial encounter: Secondary | ICD-10-CM

## 2014-07-24 DIAGNOSIS — K5909 Other constipation: Secondary | ICD-10-CM

## 2014-07-24 DIAGNOSIS — K5903 Drug induced constipation: Secondary | ICD-10-CM

## 2014-07-24 DIAGNOSIS — M48062 Spinal stenosis, lumbar region with neurogenic claudication: Secondary | ICD-10-CM

## 2014-07-24 DIAGNOSIS — M4806 Spinal stenosis, lumbar region: Secondary | ICD-10-CM

## 2014-07-24 DIAGNOSIS — F1920 Other psychoactive substance dependence, uncomplicated: Secondary | ICD-10-CM

## 2014-07-24 DIAGNOSIS — G894 Chronic pain syndrome: Secondary | ICD-10-CM

## 2014-07-24 DIAGNOSIS — Z79899 Other long term (current) drug therapy: Secondary | ICD-10-CM

## 2014-07-24 MED ORDER — MORPHINE SULFATE ER 100 MG PO TBCR: 100.0000 mg | EXTENDED_RELEASE_TABLET | Freq: Three times a day (TID) | ORAL | Status: DC

## 2014-07-24 MED ORDER — MORPHINE SULFATE 30 MG PO TABS: 30.0000 mg | ORAL_TABLET | Freq: Four times a day (QID) | ORAL | Status: DC | PRN

## 2014-07-24 NOTE — Telephone Encounter (Signed)
After talking with Dr Letta Pate and reviewing all of the historical UDS from [name] it has been found that there have been other cases of very low level codeine present and is below the cutoff so it cannot be explained by medication unless there is a  Interaction between the large number of medications Trevor Abdou is prescribed. Therefore he is not going to be discharged and we will continue to prescribe his pain medications.  (See note that was attached to the UDS in question 06/27/14.) I have notified Trevor Edgington that he is not being discharged from our practice.

## 2014-07-24 NOTE — Progress Notes (Signed)
Subjective:    Patient ID: [name], male    DOB: 1961/02/13, 54 y.o.   MRN: [id number]  HPI: [name] is a 54 year old male who returns for follow up for chronic pain and medication refill. He says his pain is located in his lower back. He rates his pain 8. His current exercise regimed is walking with cadillac walker. He has an appointment with [facility]  physical therapy at [facility] on 07/25/2014. He was getting dressed this morning and lost his balance landed on his left knee, he was able to pick himself up. He didn't seek medical attention. Educated on falls prevention and verbalizes understanding Pain Inventory Average Pain 8 Pain Right Now 8 My pain is constant, dull, stabbing and tingling  In the last 24 hours, has pain interfered with the following? General activity 8 Relation with others 7 Enjoyment of life 9 What TIME of day is your pain at its worst? morning and daytime Sleep (in general) Poor  Pain is worse with: walking and standing Pain improves with: medication Relief from Meds: 5  Mobility use a walker ability to climb steps?  yes do you drive?  yes  Function Do you have any goals in this area?  no  Neuro/Psych trouble walking anxiety  Prior Studies Any changes since last visit?  no  Physicians involved in your care Any changes since last visit?  no   Family History  Problem Relation Age of Onset  . Heart disease Father 85    Died of MI   History   Social History  . Marital Status: Married    Spouse Name: N/A    Number of Children: N/A  . Years of Education: N/A   Social History Main Topics  . Smoking status: Current Some Day Smoker -- 1.50 packs/day for 29 years    Types: Cigarettes, E-cigarettes    Last Attempt to Quit: 03/27/2013  . Smokeless tobacco: Never Used     Comment: PT STOPPED SMOKING CIGS 03/27/13. NOW CURRENTLY USING E-CIG  . Alcohol Use: No  . Drug Use: Yes    Special: Marijuana     Comment: Urine  showed THC  . Sexual Activity: None   Other Topics Concern  . None   Social History Narrative   Past Surgical History  Procedure Laterality Date  . Hernia repair    . Asd repair, sinus venosus    . Testicle surgery      testicular cancer surgery  . Cardiac defibrillator placement  08/2010  . Left heart catheterization with coronary angiogram N/A 06/14/2014    Procedure: LEFT HEART CATHETERIZATION WITH CORONARY ANGIOGRAM;  Surgeon: Jolaine Artist, MD;  Location: [facility] CATH LAB;  Service: Cardiovascular;  Laterality: N/A;   Past Medical History  Diagnosis Date  . Chronic systolic heart failure     EF 20-25%. s/p ST. Jude ICD  . CAD (coronary artery disease)     Last cath 2/12. 3-v CAD. Failed PCI of distal RCAc  CATH [facility] 4/13 with DES to  LAD X2  . Chronic back pain     lumbar stenosis  . Benign prostatic hypertrophy   . Depression   . History of testicular cancer   . Narcotic dependence     chronic  . Benzodiazepine dependence     chronic  . Anxiety   . CHF (congestive heart failure)   . DJD (degenerative joint disease)   . Automatic implantable cardiac defibrillator [employer] Jud[employer]  Analyze ST   BP 138/87 mmHg  Pulse 84  Resp 14  SpO2 96%  Opioid Risk Score:   Fall Risk Score: High Fall Risk (>13 points) (previously educated and given handout) Review of Systems  Constitutional: Positive for unexpected weight change.  Respiratory: Positive for apnea.   Gastrointestinal: Positive for constipation.  Genitourinary: Positive for difficulty urinating.  Musculoskeletal: Positive for gait problem.  Psychiatric/Behavioral: The patient is nervous/anxious.   All other systems reviewed and are negative.      Objective:   Physical Exam  Constitutional: He is oriented to person, place, and time. He appears well-developed and well-nourished.  HENT:  Head: Normocephalic and atraumatic.  Neck: Normal range of motion. Neck supple.  Cardiovascular: Normal rate, regular rhythm  and normal heart sounds.   Pulmonary/Chest: Effort normal and breath sounds normal.  Musculoskeletal:  Normal Muscle Bulk and Muscle Testing Reveals: Upper Extremities: Full ROM and Muscle strength 5/5 Lumbar Paraspinal Tenderness: L-3- L-5 Lower Extremities: Full ROM and Muscle strength 5/5 Arises from chair with ease Using cadillac walker Narrow based Gait  Neurological: He is alert and oriented to person, place, and time.  Skin: Skin is warm and dry.  Psychiatric: He has a normal mood and affect.  Nursing note and vitals reviewed.         Assessment & Plan:  1. Lumbar spinal stenosis, severe.  Refilled: MS Contin 100 mg one tablet three times a day #90 and MSIR 30 mg one tablet every 6 hours as needed for sever pain #120.  2. History of shoulder pain left side. Controlled. No complaints today.  3. Sleep apnea : compliant with CPAP  4. Physical Deconditioning: Appointment with Physical Therapist 07/25/14. 5. Constipation: Continue Linzess  20 minutes of face to face patient care time was spent during this visit. All questions were encouraged and answered.   F/U in 1 month

## 2014-07-25 ENCOUNTER — Ambulatory Visit: Payer: BLUE CROSS/BLUE SHIELD | Admitting: Physical Therapy

## 2014-07-31 ENCOUNTER — Ambulatory Visit: Payer: BLUE CROSS/BLUE SHIELD | Admitting: Physical Therapy

## 2014-08-08 ENCOUNTER — Ambulatory Visit: Payer: BLUE CROSS/BLUE SHIELD | Attending: Registered Nurse | Admitting: Physical Therapy

## 2014-08-13 ENCOUNTER — Encounter: Payer: Self-pay | Admitting: Family Medicine

## 2014-08-13 ENCOUNTER — Ambulatory Visit (INDEPENDENT_AMBULATORY_CARE_PROVIDER_SITE_OTHER): Payer: BLUE CROSS/BLUE SHIELD | Admitting: Family Medicine

## 2014-08-13 VITALS — BP 118/78 | HR 74 | Temp 98.0°F | Resp 16 | Ht 71.0 in | Wt 225.6 lb

## 2014-08-13 DIAGNOSIS — Z13220 Encounter for screening for lipoid disorders: Secondary | ICD-10-CM

## 2014-08-13 DIAGNOSIS — Z5181 Encounter for therapeutic drug level monitoring: Secondary | ICD-10-CM

## 2014-08-13 DIAGNOSIS — D751 Secondary polycythemia: Secondary | ICD-10-CM

## 2014-08-13 DIAGNOSIS — Z131 Encounter for screening for diabetes mellitus: Secondary | ICD-10-CM

## 2014-08-13 DIAGNOSIS — F1120 Opioid dependence, uncomplicated: Secondary | ICD-10-CM

## 2014-08-13 DIAGNOSIS — R0789 Other chest pain: Secondary | ICD-10-CM

## 2014-08-13 DIAGNOSIS — R55 Syncope and collapse: Secondary | ICD-10-CM

## 2014-08-13 DIAGNOSIS — Z0000 Encounter for general adult medical examination without abnormal findings: Secondary | ICD-10-CM

## 2014-08-13 DIAGNOSIS — F1920 Other psychoactive substance dependence, uncomplicated: Secondary | ICD-10-CM

## 2014-08-13 LAB — HEPATIC FUNCTION PANEL
ALT: 18 U/L (ref 0–53)
AST: 15 U/L (ref 0–37)
Albumin: 4.2 g/dL (ref 3.5–5.2)
Alkaline Phosphatase: 62 U/L (ref 39–117)
Bilirubin, Direct: 0.1 mg/dL (ref 0.0–0.3)
Indirect Bilirubin: 0.5 mg/dL (ref 0.2–1.2)
Total Bilirubin: 0.6 mg/dL (ref 0.2–1.2)
Total Protein: 6.8 g/dL (ref 6.0–8.3)

## 2014-08-13 LAB — LIPID PANEL
Cholesterol: 166 mg/dL (ref 0–200)
HDL: 40 mg/dL (ref >39)
LDL Cholesterol: 96 mg/dL (ref 0–99)
Total CHOL/HDL Ratio: 4.2 Ratio
Triglycerides: 148 mg/dL (ref <150)
VLDL: 30 mg/dL (ref 0–40)

## 2014-08-13 LAB — CBC
HCT: 47.2 % (ref 39.0–52.0)
Hemoglobin: 16.9 g/dL (ref 13.0–17.0)
MCH: 33.7 pg (ref 26.0–34.0)
MCHC: 35.8 g/dL (ref 30.0–36.0)
MCV: 94.2 fL (ref 78.0–100.0)
MPV: 9.4 fL (ref 8.6–12.4)
Platelets: 172 10*3/uL (ref 150–400)
RBC: 5.01 MIL/uL (ref 4.22–5.81)
RDW: 14.5 % (ref 11.5–15.5)
WBC: 5.8 10*3/uL (ref 4.0–10.5)

## 2014-08-13 LAB — TROPONIN I: Troponin I: 0.01 ng/mL (ref <0.06)

## 2014-08-13 LAB — HEMOGLOBIN A1C
Hgb A1c MFr Bld: 5.9 % — ABNORMAL HIGH (ref <5.7)
Mean Plasma Glucose: 123 mg/dL — ABNORMAL HIGH (ref <117)

## 2014-08-13 NOTE — Patient Instructions (Addendum)
Good to see you today. I will be in touch with your labs.   I will make sure that your troponin looks ok and will let you know the result

## 2014-08-13 NOTE — Progress Notes (Addendum)
[facility] [address] [place] [address] [phone]  Date:  08/13/2014   Name:  [name]   DOB:  [birth year]   MRN:  [id number]  PCP:  Glori Bickers, MD    Chief Complaint: Annual Exam   History of Present Illness:  [name] is a 54 y.o. very pleasant male patient who presents with the following:  Here today seeking a CPE.  He was seen in January by his pain clinic ([facility]).  He is treated for chronic lower back pain.  He is maintained on MS contin and MSIR as per their notes.   MS contin 100 mg TID and MSIR 30 QID prn, as well as ambien, trazodone, testosterone, ativan, Neurontin, valium.  He is rx Linzess for chronic constipation.  I last saw him in April of this year, at which time I agreed to be his PCP but we agreed that I would not write ANY narcotics or benzodiazepines. The first time I met him was just about a year ago when he came in with a head laceration after a fall- he seemed to be under the influence of medication at that time and was sleepy with unusual demeanor.    He states that "all my specialists are jumping at me" to see his primary care doctor.  He has a complicated medical history including sleep apnea/ hypoxemia/ polycythemia, chronic pain, CHF, CAD, ICD.  He is treated at [facility] and by "the Magazine features editor of each department" at [facility].    He states he has been dx with polycythemia vera. This is thought to be due to chronic hypoxemia, due to OSA and also to his chronic high doses of narcotics. "My hematologist wanted me to get off the morphine, testosterone, everything but I decided I would rather have a better qualify of life for 5 years, 10 years, how ever long than come off the medications."   He states he was told "years ago that I had Crohns."   He thinks that perhaps "Crohns attacks" have triggered his heart attacks; he equates times when he has had diarrhea and sweating with an MI.  "Otherwise I have never felt an MI."    For GI issues he sees Dr. Deatra Ina - he recently had a cologuard that looked ok.  They are trying to avoid doing colonoscopy porcedure if possible as he is a difficulty sedation candidate  He is fasting today for labs.   He does have a urologist- Dr. Gaynelle Arabian, who takes care of following his testosterone and PSA He is on crestor for his lipids.  He feels that he does better with lactulose for constipation so he does not generally use the linzess.   He needs a new rx for zostavax.   At the end of the visit lab tech also reported that when she went to draw  blood he requested that we "check cardiac enzymes."  Asked him about this request;  He also states that 2 days ago "took one bite of food" and had a sudden onset of GI distress, sweating, and chest pain.  "I couldn't even stand up.  My wife had to carry me to bed,  I passed out (he clarifies that he went to sleep, did not have LOC) right away so we thought I was ok."  I explained that if in the future he thinks he is having a heart attack he should seek care immediately.  Suggested that we contact  his cardiologist.  He declines, says he has no chest pain now and just wants a troponin level.  I agreed to draw this and will touch base with his cardiologist regarding follow-up assuming result is ok.  He had a cath in December of 2015; noted to have stable CAD and severe ischemic cardiomyopathy with EF of 20- 25%.   Wt Readings from Last 3 Encounters:  08/13/14 225 lb 9.6 oz (102.331 kg)  06/26/14 212 lb (96.163 kg)  06/11/14 226 lb (102.513 kg)   Weight one year ago 221.  He endorses weight loss of 70 lbs over the last year; however it appears that his weight is actually stable.   Patient Active Problem List   Diagnosis Date Noted  . Unstable angina 06/14/2014  . Therapeutic opioid induced constipation 04/02/2014  . Decreased body weight 11/08/2013  . Hypoxemia 09/25/2013  . Polycythemia 09/18/2013  . Erectile dysfunction 11/24/2012  .  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 MAI (mycobacterium avium-intracellulare) 05/16/2012  . 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 Pre-operative cardiovascular examination 11/21/2011  . 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 PAD (peripheral artery disease) 02/24/2011  . ICD-St.Jude 12/24/2010  . Depression 12/09/2010  . Tobacco abuse 12/09/2010  . cHistory of testicular cancer 12/09/2010  . Benzodiazepine dependence 12/09/2010  . Narcotic dependence 12/09/2010  . Chronic back pain 12/05/2010  . Anxiety disorder 10/17/2010  . CAD, NATIVE VESSEL 09/15/2010  . COMBINED HEART FAILURE, CHRONIC 09/15/2010  . CARDIOMYOPATHY, ISCHEMIC 09/12/2010    Past Medical History  Diagnosis Date  . Chronic systolic heart failure     EF 20-25%. s/p ST. Jude ICD  . CAD (coronary artery disease)     Last cath 2/12. 3-v CAD. Failed PCI of distal RCAc  CATH [facility] 4/13 with DES to  LAD X2  . Chronic back pain     lumbar stenosis  . Benign prostatic hypertrophy   . Depression   . History of testicular cancer   . Narcotic dependence     chronic  . Benzodiazepine dependence     chronic  . Anxiety   . CHF (congestive heart failure)   . DJD (degenerative joint disease)   . Automatic implantable cardiac defibrillator St Judes     Analyze ST    Past Surgical History  Procedure Laterality Date  . Hernia repair    . Asd repair, sinus venosus    . Testicle surgery      testicular cancer surgery  . Cardiac defibrillator placement  08/2010  . Left heart catheterization with coronary angiogram N/A 06/14/2014    Procedure: LEFT HEART CATHETERIZATION WITH CORONARY ANGIOGRAM;  Surgeon: Jolaine Artist, MD;  Location: [facility] CATH LAB;  Service: Cardiovascular;  Laterality: N/A;    History  Substance Use Topics  . Smoking status: Current Some Day Smoker -- 1.50 packs/day for 29 years    Types: Cigarettes, E-cigarettes    Last Attempt to Quit: 03/27/2013  .  Smokeless tobacco: Never Used     Comment: PT STOPPED SMOKING CIGS 03/27/13. NOW CURRENTLY USING E-CIG  . Alcohol Use: No    Family History  Problem Relation Age of Onset  . Heart disease Father 51    Died of MI    Allergies  Allergen Reactions  . Darvocet [Propoxyphene N-Acetaminophen] Anaphylaxis    Throat closes  . Propoxyphene Anaphylaxis and Swelling  . Levaquin [Levofloxacin In D5w]     Joint aches    Medication  list has been reviewed and updated.  Current Outpatient Prescriptions on File Prior to Visit  Medication Sig Dispense Refill  . alfuzosin (UROXATRAL) 10 MG 24 hr tablet Take 10 mg by mouth as needed.     Marland Kitchen aspirin 325 MG tablet Take 325 mg by mouth daily.    . beclomethasone (QVAR) 40 MCG/ACT inhaler Inhale 1 puff into the lungs 2 (two) times daily. 8.7 g 6  . carvedilol (COREG) 25 MG tablet Take 25 mg by mouth 2 (two) times daily.     . clopidogrel (PLAVIX) 75 MG tablet Take 75 mg by mouth daily.    . diazepam (VALIUM) 10 MG tablet Take 10 mg by mouth 2 (two) times daily.     Marland Kitchen gabapentin (NEURONTIN) 600 MG tablet Take 600 mg by mouth daily.    Marland Kitchen lactulose (CHRONULAC) 10 GM/15ML solution Take 30 mLs (20 g total) by mouth 2 (two) times daily. For constipation (Patient taking differently: Take 20 g by mouth 2 (two) times daily as needed. For constipation) 500 mL 4  . lisinopril (PRINIVIL,ZESTRIL) 10 MG tablet Take 10 mg by mouth daily.    Marland Kitchen LORazepam (ATIVAN) 1 MG tablet Take 1 mg by mouth 4 (four) times daily as needed. For anxiety    . morphine (MS CONTIN) 100 MG 12 hr tablet Take 1 tablet (100 mg total) by mouth 3 (three) times daily. 90 tablet 0  . morphine (MSIR) 30 MG tablet Take 1 tablet (30 mg total) by mouth every 6 (six) hours as needed for severe pain. 120 tablet 0  . nitroGLYCERIN (NITROSTAT) 0.4 MG SL tablet Place 0.4 mg under the tongue every 5 (five) minutes as needed for chest pain.    . rosuvastatin (CRESTOR) 40 MG tablet Take 40 mg by mouth daily.     . sildenafil (VIAGRA) 100 MG tablet Take 1 tablet (100 mg total) by mouth as needed for erectile dysfunction. 10 tablet 6  . spironolactone (ALDACTONE) 25 MG tablet Take 0.5 tablets (12.5 mg total) by mouth daily. 15 tablet 5  . testosterone cypionate (DEPOTESTOTERONE CYPIONATE) 200 MG/ML injection Inject into the muscle every 14 (fourteen) days.      Marland Kitchen torsemide (DEMADEX) 20 MG tablet Take 1 tablet (20 mg total) by mouth daily as needed. For swelling in legs 30 tablet 6  . traZODone (DESYREL) 100 MG tablet Take 100 mg by mouth at bedtime as needed for sleep.     Marland Kitchen ZETIA 10 MG tablet TAKE 1 TABLET EACH DAY. 30 tablet 6  . zolpidem (AMBIEN) 10 MG tablet Take 10 mg by mouth at bedtime as needed. For sleep    . Zolpidem Tartrate (INTERMEZZO) 3.5 MG SUBL Place 3.5 mg under the tongue daily as needed (sleep).     . Linaclotide (LINZESS) 145 MCG CAPS capsule Take 1 capsule (145 mcg total) by mouth daily. (Patient not taking: Reported on 08/13/2014) 30 capsule 6   No current facility-administered medications on file prior to visit.    Review of Systems:  As per HPI- otherwise negative.   Physical Examination: Filed Vitals:   08/13/14 0907  BP: 118/78  Pulse: 74  Temp: 98 F (36.7 C)  Resp: 16   Filed Vitals:   08/13/14 0907  Height: 5' 11"  (1.803 m)  Weight: 225 lb 9.6 oz (102.331 kg)   Body mass index is 31.48 kg/(m^2). Ideal Body Weight: Weight in (lb) to have BMI = 25: 178.9  GEN: WDWN, NAD, Non-toxic, A & O x  3, overweight.   HEENT: Atraumatic, Normocephalic. Neck supple. No masses, No LAD.  Bilateral TM wnl, oropharynx normal.  PEERL,EOMI.   Ears and Nose: No external deformity. CV: RRR, No M/G/R. No JVD. No thrill. No extra heart sounds. PULM: CTA B, no wheezes, crackles, rhonchi. No retractions. No resp. distress. No accessory muscle use. ABD: S, NT, ND, +BS. No rebound. No HSM. EXTR: No c/c/e NEURO Normal gait.  PSYCH: unusual demeanor; chronic high doses of narcotics.  Conversant. Not depressed or anxious appearing.    Assessment and Plan: Physical exam  Screening for hyperlipidemia - Plan: Hemoglobin A1c  Screening for diabetes mellitus - Plan: Lipid panel  Medication monitoring encounter - Plan: Hepatic Function Panel  Syncope, unspecified syncope type - Plan: Troponin I, CANCELED: Troponin I  Polycythemia - Plan: CBC  Other chest pain  Narcotic dependence  Ora is here today for a CPE and several other issues today He had an apparent episode of CP vs GI distress a couple of days ago.  CP is now totally resolved per his report.   Drew a stat troponin I which is normal. Will plan to contact his cardiologist to inquire about follow-up plans  Await labs.   He states he is no longer smoking, is using an e cigarette that does not contain nicotine  Results for orders placed or performed in visit on 08/13/14  Lipid panel  Result Value Ref Range   Cholesterol 166 0 - 200 mg/dL   Triglycerides 148 <150 mg/dL   HDL 40 >39 mg/dL   Total CHOL/HDL Ratio 4.2 Ratio   VLDL 30 0 - 40 mg/dL   LDL Cholesterol 96 0 - 99 mg/dL  Hemoglobin A1c  Result Value Ref Range   Hgb A1c MFr Bld 5.9 (H) <5.7 %   Mean Plasma Glucose 123 (H) <117 mg/dL  Hepatic Function Panel  Result Value Ref Range   Total Bilirubin 0.6 0.2 - 1.2 mg/dL   Bilirubin, Direct 0.1 0.0 - 0.3 mg/dL   Indirect Bilirubin 0.5 0.2 - 1.2 mg/dL   Alkaline Phosphatase 62 39 - 117 U/L   AST 15 0 - 37 U/L   ALT 18 0 - 53 U/L   Total Protein 6.8 6.0 - 8.3 g/dL   Albumin 4.2 3.5 - 5.2 g/dL  CBC  Result Value Ref Range   WBC 5.8 4.0 - 10.5 K/uL   RBC 5.01 4.22 - 5.81 MIL/uL   Hemoglobin 16.9 13.0 - 17.0 g/dL   HCT 47.2 39.0 - 52.0 %   MCV 94.2 78.0 - 100.0 fL   MCH 33.7 26.0 - 34.0 pg   MCHC 35.8 30.0 - 36.0 g/dL   RDW 14.5 11.5 - 15.5 %   Platelets 172 150 - 400 K/uL   MPV 9.4 8.6 - 12.4 fL  Troponin I  Result Value Ref Range   Troponin I 0.01 <0.06 ng/mL   Signed Lamar Blinks,  MD  Called pt on 08/14/14.  Dr. Haroldine Laws did kindly touch base with me on the phone.  With a negative troponin and recent stable cath he does not need to be seen unless he continues to have CP.  His cholesterol is fine, continues to have a1c in pre- diabetes range.  He will plan to schedule a follow-up visit with Dr. Haroldine Laws as he has noted a few episodes of his CP, usually associated with having a BM

## 2014-08-16 ENCOUNTER — Ambulatory Visit: Payer: BLUE CROSS/BLUE SHIELD | Attending: Registered Nurse

## 2014-08-16 DIAGNOSIS — R262 Difficulty in walking, not elsewhere classified: Secondary | ICD-10-CM | POA: Insufficient documentation

## 2014-08-16 DIAGNOSIS — R5381 Other malaise: Secondary | ICD-10-CM | POA: Diagnosis present

## 2014-08-17 ENCOUNTER — Encounter: Payer: Self-pay | Admitting: Internal Medicine

## 2014-08-20 ENCOUNTER — Encounter: Payer: Self-pay | Admitting: Internal Medicine

## 2014-08-20 ENCOUNTER — Ambulatory Visit: Payer: BLUE CROSS/BLUE SHIELD | Admitting: Physical Therapy

## 2014-08-20 ENCOUNTER — Ambulatory Visit (INDEPENDENT_AMBULATORY_CARE_PROVIDER_SITE_OTHER): Payer: BLUE CROSS/BLUE SHIELD | Admitting: Internal Medicine

## 2014-08-20 VITALS — BP 112/80 | HR 75 | Ht 72.0 in | Wt 227.8 lb

## 2014-08-20 DIAGNOSIS — R5381 Other malaise: Secondary | ICD-10-CM | POA: Diagnosis not present

## 2014-08-20 DIAGNOSIS — I255 Ischemic cardiomyopathy: Secondary | ICD-10-CM

## 2014-08-20 DIAGNOSIS — Z4502 Encounter for adjustment and management of automatic implantable cardiac defibrillator: Secondary | ICD-10-CM | POA: Diagnosis not present

## 2014-08-20 DIAGNOSIS — I5022 Chronic systolic (congestive) heart failure: Secondary | ICD-10-CM | POA: Diagnosis not present

## 2014-08-20 LAB — MDC_IDC_ENUM_SESS_TYPE_INCLINIC
Battery Remaining Longevity: 70.8 mo
Brady Statistic RA Percent Paced: 0.08 %
Brady Statistic RV Percent Paced: 0.14 %
Date Time Interrogation Session: 20160208101249
HighPow Impedance: 73 Ohm
HighPow Impedance: 73.125
Implantable Pulse Generator Serial Number: 815096
Lead Channel Impedance Value: 300 Ohm
Lead Channel Impedance Value: 450 Ohm
Lead Channel Pacing Threshold Amplitude: 0.625 V
Lead Channel Pacing Threshold Amplitude: 2.25 V
Lead Channel Pacing Threshold Amplitude: 2.25 V
Lead Channel Pacing Threshold Pulse Width: 0.5 ms
Lead Channel Pacing Threshold Pulse Width: 0.5 ms
Lead Channel Pacing Threshold Pulse Width: 0.8 ms
Lead Channel Sensing Intrinsic Amplitude: 12 mV
Lead Channel Sensing Intrinsic Amplitude: 2.2 mV
Lead Channel Setting Pacing Amplitude: 2 V
Lead Channel Setting Pacing Amplitude: 3 V
Lead Channel Setting Pacing Pulse Width: 0.5 ms
Lead Channel Setting Sensing Sensitivity: 0.5 mV
Zone Setting Detection Interval: 270 ms
Zone Setting Detection Interval: 340 ms
Zone Setting Detection Interval: 400 ms

## 2014-08-20 NOTE — Patient Instructions (Addendum)

## 2014-08-20 NOTE — Progress Notes (Signed)
Patient Care Team: Jolaine Artist, MD as PCP - General (Cardiology) Elsie Stain, MD as Consulting Physician (Pulmonary Disease) Murat Eston Mould, MD as Referring Physician (Hematology) Alric Ran, MD as Referring Physician (Pulmonary Disease)   HPI  [name] is a 54 y.o. male  Seen in followup for ICD implantation in the setting of ischemic cardiomyopathy with ejection fraction of 25% range not amenable to revascularization. He also had complex ventricular ectopy. He is part of the analyze ST trial.   He also has history of tobacco use, obesity, OSA, depression, chronic back pain on high-dose narcotics  Cath showed (2/11):  LM: ostial 20%  LAD: Diffuse 40% prox, mid 60-70% focal lesion. D1: 30% tubular lesion.  LCX: gave off a ramus branch, small OM-1. The distal AV groove circ was subtotally occluded which was chronic. In the ramus branch, there was evidence of a previously placed stent. This was now totally occluded. In the OM-1, there was a 40% proximal lesion.  RCA: dominant vessel, had diffuse 40-50% disease throughout. In the distal RCA, prior to the PDA, there was a tubular 40% lesion. In the distal RCA just after the takeoff of the PDA, there was a 99% lesion with a near subtotal occlusion. In the PDA, there was a 50% tubular lesion distally.  LV 20%. Failed PCI of distal RCA.    he apparently is followed at [facility] failure service  He has problems with narcotic addiction from chronic pain  He expresses frustration at loss of connection with Dr Reine Just  He has lost a total of 50 pounds and was intercurrently diagnosed with polycythemia vera. He has put on about 10-15 pounds. Still however he notes that   he is much better with less back pain and left leg numbness.    He continues to work on stopping smoking.  He had an episode recently of vomiting and diarrhea. He wondered whether it could be cardiac in origin. He saw his PCP about 48 hours  later. Troponin level was negative. Past Medical History  Diagnosis Date  . Chronic systolic heart failure     EF 20-25%. s/p ST. Jude ICD  . CAD (coronary artery disease)     Last cath 2/12. 3-v CAD. Failed PCI of distal RCAc  CATH [facility] 4/13 with DES to  LAD X2  . Chronic back pain     lumbar stenosis  . Benign prostatic hypertrophy   . Depression   . History of testicular cancer   . Narcotic dependence     chronic  . Benzodiazepine dependence     chronic  . Anxiety   . CHF (congestive heart failure)   . DJD (degenerative joint disease)   . Automatic implantable cardiac defibrillator St Judes     Analyze ST    Past Surgical History  Procedure Laterality Date  . Hernia repair    . Asd repair, sinus venosus    . Testicle surgery      testicular cancer surgery  . Cardiac defibrillator placement  08/2010  . Left heart catheterization with coronary angiogram N/A 06/14/2014    Procedure: LEFT HEART CATHETERIZATION WITH CORONARY ANGIOGRAM;  Surgeon: Jolaine Artist, MD;  Location: [facility] CATH LAB;  Service: Cardiovascular;  Laterality: N/A;    Current Outpatient Prescriptions  Medication Sig Dispense Refill  . alfuzosin (UROXATRAL) 10 MG 24 hr tablet Take 10 mg by mouth as needed.     Marland Kitchen aspirin 325 MG tablet  Take 325 mg by mouth daily.    . beclomethasone (QVAR) 40 MCG/ACT inhaler Inhale 1 puff into the lungs 2 (two) times daily. 8.7 g 6  . carvedilol (COREG) 25 MG tablet Take 25 mg by mouth 2 (two) times daily.     . clopidogrel (PLAVIX) 75 MG tablet Take 75 mg by mouth daily.    . diazepam (VALIUM) 10 MG tablet Take 10 mg by mouth 2 (two) times daily.     Marland Kitchen gabapentin (NEURONTIN) 600 MG tablet Take 600 mg by mouth daily.    Marland Kitchen lactulose (CHRONULAC) 10 GM/15ML solution Take 30 mLs (20 g total) by mouth 2 (two) times daily. For constipation (Patient taking differently: Take 20 g by mouth 2 (two) times daily as needed. For constipation) 500 mL 4  . Linaclotide (LINZESS) 145 MCG CAPS  capsule Take 1 capsule (145 mcg total) by mouth daily. 30 capsule 6  . lisinopril (PRINIVIL,ZESTRIL) 10 MG tablet Take 10 mg by mouth daily.    Marland Kitchen LORazepam (ATIVAN) 1 MG tablet Take 1 mg by mouth 4 (four) times daily as needed. For anxiety    . morphine (MS CONTIN) 100 MG 12 hr tablet Take 1 tablet (100 mg total) by mouth 3 (three) times daily. 90 tablet 0  . morphine (MSIR) 30 MG tablet Take 1 tablet (30 mg total) by mouth every 6 (six) hours as needed for severe pain. 120 tablet 0  . nitroGLYCERIN (NITROSTAT) 0.4 MG SL tablet Place 0.4 mg under the tongue every 5 (five) minutes as needed for chest pain.    . rosuvastatin (CRESTOR) 40 MG tablet Take 40 mg by mouth daily.    . sildenafil (VIAGRA) 100 MG tablet Take 1 tablet (100 mg total) by mouth as needed for erectile dysfunction. 10 tablet 6  . spironolactone (ALDACTONE) 25 MG tablet Take 0.5 tablets (12.5 mg total) by mouth daily. 15 tablet 5  . testosterone cypionate (DEPOTESTOTERONE CYPIONATE) 200 MG/ML injection Inject into the muscle every 14 (fourteen) days.      Marland Kitchen torsemide (DEMADEX) 20 MG tablet Take 1 tablet (20 mg total) by mouth daily as needed. For swelling in legs 30 tablet 6  . traZODone (DESYREL) 100 MG tablet Take 100 mg by mouth at bedtime as needed for sleep.     Marland Kitchen ZETIA 10 MG tablet TAKE 1 TABLET EACH DAY. 30 tablet 6  . zolpidem (AMBIEN) 10 MG tablet Take 10 mg by mouth at bedtime as needed. For sleep    . Zolpidem Tartrate (INTERMEZZO) 3.5 MG SUBL Place 3.5 mg under the tongue daily as needed (sleep).      No current facility-administered medications for this visit.    Allergies  Allergen Reactions  . Darvocet [Propoxyphene N-Acetaminophen] Anaphylaxis    Throat closes  . Propoxyphene Anaphylaxis and Swelling  . Levaquin [Levofloxacin In D5w]     Joint aches    Review of Systems negative except from HPI and PMH  Physical Exam BP 112/80 mmHg  Pulse 75  Ht 6' (1.829 m)  Wt 227 lb 12.8 oz (103.329 kg)  BMI  30.89 kg/m2 Well developed and nourished in no acute distress HENT normal Neck supple with JVP-flat Clear Regular rate and rhythm, no murmurs or gallops Abd-soft with active BS No Clubbing cyanosis edema Skin-warm and dry A & Oriented  Grossly normal sensory and motor function ECG demonstrates sinus rhythm at 75 Intervals 15/11/38 & T wave inversions in the anterolateral leads   Assessment and  Plan  Coronary artery disease  Congestive heart failure -chronic-systolic  Implantable defibrillator  Frustrations with healthcare  At this point he is euvolemic. He has not had any having any symptoms consistent with ischemia. We will continue him on his current medications. He wondered about increasing his Aldactone; I suggested he reviewed this with Dr. Stann Mainland or Dr. Reine Just.  He has concerns about the lack of response from one of his physicians regarding the test. I will forward a message to him.

## 2014-08-22 ENCOUNTER — Encounter: Payer: BLUE CROSS/BLUE SHIELD | Attending: Physical Medicine & Rehabilitation | Admitting: Registered Nurse

## 2014-08-22 ENCOUNTER — Encounter: Payer: Self-pay | Admitting: Registered Nurse

## 2014-08-22 ENCOUNTER — Encounter: Payer: Self-pay | Admitting: Internal Medicine

## 2014-08-22 VITALS — BP 143/98 | HR 83 | Resp 14

## 2014-08-22 DIAGNOSIS — M48062 Spinal stenosis, lumbar region with neurogenic claudication: Secondary | ICD-10-CM

## 2014-08-22 DIAGNOSIS — Z79899 Other long term (current) drug therapy: Secondary | ICD-10-CM

## 2014-08-22 DIAGNOSIS — M4806 Spinal stenosis, lumbar region: Secondary | ICD-10-CM

## 2014-08-22 DIAGNOSIS — F1920 Other psychoactive substance dependence, uncomplicated: Secondary | ICD-10-CM

## 2014-08-22 DIAGNOSIS — F1120 Opioid dependence, uncomplicated: Secondary | ICD-10-CM

## 2014-08-22 DIAGNOSIS — G894 Chronic pain syndrome: Secondary | ICD-10-CM

## 2014-08-22 DIAGNOSIS — Z5181 Encounter for therapeutic drug level monitoring: Secondary | ICD-10-CM

## 2014-08-22 DIAGNOSIS — K5909 Other constipation: Secondary | ICD-10-CM

## 2014-08-22 DIAGNOSIS — T402X5A Adverse effect of other opioids, initial encounter: Secondary | ICD-10-CM

## 2014-08-22 DIAGNOSIS — K5903 Drug induced constipation: Secondary | ICD-10-CM

## 2014-08-22 DIAGNOSIS — R5381 Other malaise: Secondary | ICD-10-CM | POA: Diagnosis present

## 2014-08-22 MED ORDER — MORPHINE SULFATE 30 MG PO TABS: 30.0000 mg | ORAL_TABLET | Freq: Four times a day (QID) | ORAL | Status: DC | PRN

## 2014-08-22 MED ORDER — MORPHINE SULFATE ER 100 MG PO TBCR: 100.0000 mg | EXTENDED_RELEASE_TABLET | Freq: Three times a day (TID) | ORAL | Status: DC

## 2014-08-22 NOTE — Progress Notes (Signed)
Subjective:    Patient ID: [name], male    DOB: 1960-11-29, 54 y.o.   MRN: [id number]  HPI: [name] is a 54 year old male who returns for follow up for chronic pain and medication refill. He says his pain is located in his lower back radiating into his lower extremities laterally. He rates his pain 9. His current exercise regime is attending physical therapy at [facility] twice a week.  Pain Inventory Average Pain 9 Pain Right Now 9 My pain is constant, sharp, burning, dull, stabbing, tingling and aching  In the last 24 hours, has pain interfered with the following? General activity 7 Relation with others 5 Enjoyment of life 9 What TIME of day is your pain at its worst? all Sleep (in general) Good  Pain is worse with: walking, bending, standing and some activites Pain improves with: rest and medication Relief from Meds: 6  Mobility walk with assistance use a walker do you drive?  yes  Function I need assistance with the following:  meal prep, household duties and shopping Do you have any goals in this area?  yes  Neuro/Psych bowel control problems weakness numbness trouble walking anxiety  Prior Studies Any changes since last visit?  no  Physicians involved in your care Any changes since last visit?  yes   Family History  Problem Relation Age of Onset  . Heart disease Father 76    Died of MI   History   Social History  . Marital Status: Married    Spouse Name: N/A  . Number of Children: N/A  . Years of Education: N/A   Social History Main Topics  . Smoking status: Current Some Day Smoker -- 1.50 packs/day for 29 years    Types: Cigarettes, E-cigarettes    Last Attempt to Quit: 03/27/2013  . Smokeless tobacco: Never Used     Comment: PT STOPPED SMOKING CIGS 03/27/13. NOW CURRENTLY USING E-CIG  . Alcohol Use: No  . Drug Use: Yes    Special: Marijuana     Comment: Urine showed THC  . Sexual Activity: Not on file   Other Topics  Concern  . None   Social History Narrative   Past Surgical History  Procedure Laterality Date  . Hernia repair    . Asd repair, sinus venosus    . Testicle surgery      testicular cancer surgery  . Cardiac defibrillator placement  08/2010  . Left heart catheterization with coronary angiogram N/A 06/14/2014    Procedure: LEFT HEART CATHETERIZATION WITH CORONARY ANGIOGRAM;  Surgeon: Jolaine Artist, MD;  Location: [facility] CATH LAB;  Service: Cardiovascular;  Laterality: N/A;   Past Medical History  Diagnosis Date  . Chronic systolic heart failure     EF 20-25%. s/p ST. Jude ICD  . CAD (coronary artery disease)     Last cath 2/12. 3-v CAD. Failed PCI of distal RCAc  CATH [facility] 4/13 with DES to  LAD X2  . Chronic back pain     lumbar stenosis  . Benign prostatic hypertrophy   . Depression   . History of testicular cancer   . Narcotic dependence     chronic  . Benzodiazepine dependence     chronic  . Anxiety   . CHF (congestive heart failure)   . DJD (degenerative joint disease)   . Automatic implantable cardiac defibrillator St Judes     Analyze ST   BP 143/98 mmHg  Pulse 83  Resp 14  SpO2 95%  Opioid Risk Score:   Fall Risk Score: Moderate Fall Risk (6-13 points)  Review of Systems  Constitutional:       Weight gain and loss  Gastrointestinal: Positive for constipation.  Musculoskeletal: Positive for gait problem.  Neurological: Positive for numbness.  Hematological: Bruises/bleeds easily.  Psychiatric/Behavioral: The patient is nervous/anxious.   All other systems reviewed and are negative.      Objective:   Physical Exam  Constitutional: He is oriented to person, place, and time. He appears well-developed and well-nourished.  HENT:  Head: Normocephalic and atraumatic.  Neck: Normal range of motion. Neck supple.  Cardiovascular: Normal rate and regular rhythm.   Pulmonary/Chest: Effort normal and breath sounds normal.  Musculoskeletal:  Normal Muscle Bulk and  Muscle Testing Reveals: Upper Extremities: Full ROM and Muscle strength 5/5 Lumbar Paraspinal Tenderness: L-3- L-5 Lower Extremities: Full ROM and Muscle strength 5/5 Bilateral Lower Extremities Flexion Produces Pain into Bilateral Calf's Arise from chair ease Using Valene Bors   Neurological: He is alert and oriented to person, place, and time.  Skin: Skin is warm and dry.  Psychiatric: He has a normal mood and affect.  Nursing note and vitals reviewed.         Assessment & Plan:  1. Lumbar spinal stenosis, severe.  Refilled: MS Contin 100 mg one tablet three times a day #90 and MSIR 30 mg one tablet every 6 hours as needed for sever pain #120.  2. History of shoulder pain left side. Controlled. No complaints today.  3. Sleep apnea : compliant with CPAP  4. Physical Deconditioning: Continue Physical Therapy  5. Constipation: Continue Linzess  20 minutes of face to face patient care time was spent during this visit. All questions were encouraged and answered.   F/U in 1 month

## 2014-08-28 ENCOUNTER — Ambulatory Visit: Payer: BLUE CROSS/BLUE SHIELD | Admitting: Physical Therapy

## 2014-08-29 ENCOUNTER — Ambulatory Visit (INDEPENDENT_AMBULATORY_CARE_PROVIDER_SITE_OTHER): Payer: BLUE CROSS/BLUE SHIELD | Admitting: Pulmonary Disease

## 2014-08-29 ENCOUNTER — Encounter: Payer: Self-pay | Admitting: Pulmonary Disease

## 2014-08-29 VITALS — BP 122/80 | HR 77 | Temp 97.0°F | Ht 72.0 in | Wt 225.0 lb

## 2014-08-29 DIAGNOSIS — G4737 Central sleep apnea in conditions classified elsewhere: Secondary | ICD-10-CM

## 2014-08-29 DIAGNOSIS — G4733 Obstructive sleep apnea (adult) (pediatric): Secondary | ICD-10-CM

## 2014-08-29 DIAGNOSIS — G4731 Primary central sleep apnea: Secondary | ICD-10-CM

## 2014-08-29 NOTE — Patient Instructions (Signed)
Continue on your ASV device, and keep up with your mask changes and supplies. 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

## 2014-08-29 NOTE — Progress Notes (Signed)
   Subjective:    Patient ID: [name], male    DOB: Mar 14, 1961, 54 y.o.   MRN: [id number]  HPI Patient comes in today for follow-up of his complex sleep apnea. He is being maintained on an ASV device, and feels that he continues to do well. He denies any issues with his machine, and no problems with mask fitting or leak. He continues to feel that it helps his sleep.   Review of Systems  Constitutional: Negative for fever and unexpected weight change.  HENT: Negative for congestion, dental problem, ear pain, nosebleeds, postnasal drip, rhinorrhea, sinus pressure, sneezing, sore throat and trouble swallowing.   Eyes: Negative for redness and itching.  Respiratory: Negative for cough, chest tightness, shortness of breath and wheezing.   Cardiovascular: Negative for palpitations and leg swelling.  Gastrointestinal: Negative for nausea and vomiting.  Genitourinary: Negative for dysuria.  Musculoskeletal: Negative for joint swelling.  Skin: Negative for rash.  Neurological: Negative for headaches.  Hematological: Does not bruise/bleed easily.  Psychiatric/Behavioral: Negative for dysphoric mood. The patient is not nervous/anxious.        Objective:   Physical Exam Overweight male in no acute distress Nose without purulence or discharge noted No skin breakdown or pressure necrosis from the C Pap mask Neck without lymphadenopathy or thyromegaly Lower extremities with mild edema, no cyanosis Alert and oriented, moves all 4 extremities.        Assessment & Plan:

## 2014-08-29 NOTE — Assessment & Plan Note (Signed)
The patient states that he is continuing to wear his ASV device, and continues to see where it helps his sleep and daytime alertness. He is having no issues with his machine or mask fit, and is also wearing oxygen at night through his device.  I have reminded him to keep up with his mask cushion changes and supplies, and to work on modest weight loss. I'll see him back in one year if doing well.

## 2014-08-30 ENCOUNTER — Telehealth: Payer: Self-pay | Admitting: Critical Care Medicine

## 2014-08-30 ENCOUNTER — Telehealth: Payer: Self-pay | Admitting: Gastroenterology

## 2014-08-30 NOTE — Telephone Encounter (Signed)
Pt requesting to see Dr Joya Gaskins.  States that he needs to be on oxygen during the day and with exertion.  He states the gets very sob walking room to room in house.  He also states his hematologist thinks he needs it also due to his Polycythemia.    He also stated that he is trying to get disability and he states his chart doesn't say anything about his need for oxygen.  Offered pt first available 09/19/14 but he wants to be seen sooner.  Please advise.

## 2014-08-30 NOTE — Telephone Encounter (Signed)
Reports no bowel movement or passage of gas for 4 days. He can hear his stomach gurgling. He denies fever or nausea. He wants to try an enema and go to the ER if no improvement. Appointment made.

## 2014-08-31 ENCOUNTER — Other Ambulatory Visit: Payer: Self-pay

## 2014-08-31 ENCOUNTER — Encounter: Payer: Self-pay | Admitting: Physical Therapy

## 2014-08-31 ENCOUNTER — Ambulatory Visit: Payer: BLUE CROSS/BLUE SHIELD | Admitting: Physical Therapy

## 2014-08-31 DIAGNOSIS — R5381 Other malaise: Secondary | ICD-10-CM | POA: Diagnosis not present

## 2014-08-31 DIAGNOSIS — R531 Weakness: Secondary | ICD-10-CM

## 2014-08-31 DIAGNOSIS — R6889 Other general symptoms and signs: Secondary | ICD-10-CM

## 2014-08-31 DIAGNOSIS — R262 Difficulty in walking, not elsewhere classified: Secondary | ICD-10-CM

## 2014-08-31 MED ORDER — NALOXEGOL OXALATE 25 MG PO TABS: 1.0000 | ORAL_TABLET | Freq: Every day | ORAL | Status: DC

## 2014-08-31 NOTE — Telephone Encounter (Signed)
Would need to offer another provider as I am out part of next week

## 2014-08-31 NOTE — Telephone Encounter (Signed)
Discussed with the patient. Rx escribed to the pharmacy. He is aware it may require prior authorization. Scheduled an appointment but it will be sooner than anticipated due to provider's schedule.

## 2014-08-31 NOTE — Telephone Encounter (Signed)
Prescribed movantik 25 mg daily #7 and then call back in one week

## 2014-08-31 NOTE — Therapy (Addendum)
[facility] [address] [place], [place], [place], [address] Phone: [phone]   Fax:  [phone]  Physical Therapy Treatment  Patient Details  Name: [name] MRN: [id number] Date of Birth: 05-Feb-1961 Referring Provider:  Jolaine Artist, MD  Encounter Date: 08/31/2014    Past Medical History  Diagnosis Date  . Chronic systolic heart failure     EF 20-25%. s/p ST. Jude ICD  . CAD (coronary artery disease)     Last cath 2/12. 3-v CAD. Failed PCI of distal RCAc  CATH [facility] 4/13 with DES to  LAD X2  . Chronic back pain     lumbar stenosis  . Benign prostatic hypertrophy   . Depression   . History of testicular cancer   . Narcotic dependence     chronic  . Benzodiazepine dependence     chronic  . Anxiety   . CHF (congestive heart failure)   . DJD (degenerative joint disease)   . Automatic implantable cardiac defibrillator [employer] Judes     Analyze ST    Past Surgical History  Procedure Laterality Date  . Hernia repair    . Asd repair, sinus venosus    . Testicle surgery      testicular cancer surgery  . Cardiac defibrillator placement  08/2010  . Left heart catheterization with coronary angiogram N/A 06/14/2014    Procedure: LEFT HEART CATHETERIZATION WITH CORONARY ANGIOGRAM;  Surgeon: Jolaine Artist, MD;  Location: [facility] CATH LAB;  Service: Cardiovascular;  Laterality: N/A;    There were no vitals taken for this visit.  Visit Diagnosis:  Physical deconditioning  Difficulty walking  Weakness generalized  Activity intolerance      Subjective Assessment - 08/31/14 0816    Symptoms patient needs o2 while sleeping even during the day, difficult negotiating stairs, walking distance is limited even at the house, at the grocery store needs shopping chart for support,                     [facility] Adult PT Treatment/Exercise - 08/31/14 0001    Balance   Balance Assessed Yes   Static Standing Balance    Single Leg Stance - Right Leg 2  with cone tapping   Single Leg Stance - Left Leg 2  with cone tapping   Tandem Stance - Right Leg 2   Tandem Stance - Left Leg 2   Exercises   Exercises Shoulder;Knee/Hip   Knee/Hip Exercises: Aerobic   Stationary Bike 12   Knee/Hip Exercises: Standing   Gait Training negotiating stairs 16step over step   Other Standing Knee Exercises stairs stepping up, sidestepping 3 x10   Shoulder Exercises: Seated   Other Seated Exercises pectoralis push 30#                  PT Short Term Goals - 08/31/14 0859    PT SHORT TERM GOAL #1   Title be independent with initial HEP   Time 3   Period Weeks   Status On-going   PT SHORT TERM GOAL #2   Title report a 30% improvement in endurance with home tasks and self-care   Time 3   Period Weeks   Status On-going           PT Long Term Goals - 08/31/14 0900    PT LONG TERM GOAL #1   Title demonstrate and/or verbalize techniques to reduce the risk of re-injury to include info ON:GEXB prevention  Time 8   Period Weeks   Status Achieved   PT LONG TERM GOAL #2   Title be independent with advanced HEP   Time 8   Period Weeks   Status On-going   PT LONG TERM GOAL #3   Title report no falls at home   Status On-going   PT LONG TERM GOAL #4   Title report a 60% improvement with endurance with home tasks and self-care   Time 8   Period Weeks   Status On-going   PT LONG TERM GOAL #5   Title improve Berg to> or+ to 54/56 to reduce fall risk   Time 8   Period Weeks   Status On-going               Problem List Patient Active Problem List   Diagnosis Date Noted  . Unstable angina 06/14/2014  . Therapeutic opioid induced constipation 04/02/2014  . Decreased body weight 11/08/2013  . Hypoxemia 09/25/2013  . Polycythemia 09/18/2013  . Erectile dysfunction 11/24/2012  . 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 MAI (mycobacterium avium-intracellulare) 05/16/2012  . Complex  sleep apnea syndrome 05/16/2012  . Pre-operative cardiovascular examination 11/21/2011  . 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 PAD (peripheral artery disease) 02/24/2011  . ICD-St.Jude 12/24/2010  . Depression 12/09/2010  . Tobacco abuse 12/09/2010  . cHistory of testicular cancer 12/09/2010  . Benzodiazepine dependence 12/09/2010  . Narcotic dependence 12/09/2010  . Chronic back pain 12/05/2010  . Anxiety disorder 10/17/2010  . CAD, NATIVE VESSEL 09/15/2010  . COMBINED HEART FAILURE, CHRONIC 09/15/2010  . CARDIOMYOPATHY, ISCHEMIC 09/12/2010    NAUMANN-HOUEGNIFIO,Remer Couse PTA 08/31/2014, 9:24 AM  PHYSICAL THERAPY DISCHARGE SUMMARY  Visits from Start of Care: 2  Current functional level related to goals / functional outcomes: No change in status.  Pt attended 2 PT treatments and had no-show and cancelled appointments in violation of our attendance policy.  Pt will need to see MD and receive new referral if he is to return.    Remaining deficits: Unknown as pt has not attended PT since 08/20/14.     Education / Equipment: HEP Plan: Patient agrees to discharge.  Patient goals were not met. Patient is being discharged due to not returning since the last visit.  ?????   Sigurd Sos, [place] 09/19/2014 10:08 AM  [facility] [address] [place], [place], [place], [address] Phone: [phone]   Fax:  [phone]

## 2014-08-31 NOTE — Telephone Encounter (Signed)
Called and spoke with pt and he is aware of appt with TP on next Friday at 11:45.  Pt is aware and nothing further is needed. He will call back for anything further needed.

## 2014-09-04 ENCOUNTER — Ambulatory Visit (INDEPENDENT_AMBULATORY_CARE_PROVIDER_SITE_OTHER): Payer: BLUE CROSS/BLUE SHIELD | Admitting: Gastroenterology

## 2014-09-04 ENCOUNTER — Encounter: Payer: Self-pay | Admitting: Gastroenterology

## 2014-09-04 VITALS — BP 118/78 | HR 78 | Resp 16 | Ht 71.5 in | Wt 232.0 lb

## 2014-09-04 DIAGNOSIS — K5901 Slow transit constipation: Secondary | ICD-10-CM

## 2014-09-04 DIAGNOSIS — K5900 Constipation, unspecified: Secondary | ICD-10-CM | POA: Insufficient documentation

## 2014-09-04 MED ORDER — LINACLOTIDE 145 MCG PO CAPS: 145.0000 ug | ORAL_CAPSULE | Freq: Two times a day (BID) | ORAL | Status: DC

## 2014-09-04 NOTE — Progress Notes (Signed)
      History of Present Illness:  [name] has returned for follow-up of constipation.  Cologuard test was negative.  He was taking one Linzess daily along with MiraLAX.  Chronically he has episodes of severe nausea vomiting and diarrhea.  Over the past 3-4 days he's been taking  Linzess 2 at a time and has been moved moving his bowels more regularly.    Review of Systems: Pertinent positive and negative review of systems were noted in the above HPI section. All other review of systems were otherwise negative.    Current Medications, Allergies, Past Medical History, Past Surgical History, Family History and Social History were reviewed in [facility] record  Vital signs were reviewed in today's medical record. Physical Exam: General: Well developed , well nourished, no acute distress Skin: anicteric Head: Normocephalic and atraumatic Eyes:  sclerae anicteric, EOMI Ears: Normal auditory acuity Mouth: No deformity or lesions Lungs: Clear throughout to auscultation Heart: Regular rate and rhythm; no murmurs, rubs or bruits Abdomen: Soft, non tender and non distended. No masses, hepatosplenomegaly or hernias noted. Normal Bowel sounds Rectal:deferred Musculoskeletal: Symmetrical with no gross deformities  Pulses:  Normal pulses noted Extremities: No clubbing, cyanosis, edema or deformities noted Neurological: Alert oriented x 4, grossly nonfocal Psychological:  Alert and cooperative. Normal mood and affect  See Assessment and Plan under Problem List

## 2014-09-04 NOTE — Assessment & Plan Note (Signed)
He has opiate-induced constipation that seems to be responding to high-dose Linzess.  He was instructed to titrate this medication and to add MiraLAX if no substantial bowel movement after 2-3 days.  From what he describes I think he has a reaction to obstipation and constipation characterized by vomiting and multiple stools.  Hopefully this can be avoided.  He cannot afford Movantik

## 2014-09-04 NOTE — Addendum Note (Signed)
Addended by: Oda Kilts on: 09/04/2014 03:48 PM   Modules accepted: Orders

## 2014-09-04 NOTE — Patient Instructions (Signed)
Use Linzess twice a day Miralax every third day

## 2014-09-07 ENCOUNTER — Ambulatory Visit: Payer: BLUE CROSS/BLUE SHIELD | Admitting: Adult Health

## 2014-09-13 ENCOUNTER — Ambulatory Visit: Payer: BLUE CROSS/BLUE SHIELD | Attending: Registered Nurse

## 2014-09-13 ENCOUNTER — Telehealth: Payer: Self-pay

## 2014-09-13 DIAGNOSIS — R5381 Other malaise: Secondary | ICD-10-CM | POA: Insufficient documentation

## 2014-09-13 DIAGNOSIS — R262 Difficulty in walking, not elsewhere classified: Secondary | ICD-10-CM | POA: Insufficient documentation

## 2014-09-13 NOTE — Telephone Encounter (Signed)
PT left message on pt's personal voicemail due to missed appt 09/13/14.  Pt called on 09/12/14 to confirm this appointment and didn't show at the appt time.  PT left message reminding pt of next appointment 09/17/14 at 12:30.  PT will discharge pt if another missed appointment.

## 2014-09-19 ENCOUNTER — Ambulatory Visit: Payer: BLUE CROSS/BLUE SHIELD | Admitting: Physical Therapy

## 2014-09-20 ENCOUNTER — Encounter: Payer: BLUE CROSS/BLUE SHIELD | Attending: Physical Medicine & Rehabilitation | Admitting: Registered Nurse

## 2014-09-20 ENCOUNTER — Encounter: Payer: Self-pay | Admitting: Registered Nurse

## 2014-09-20 ENCOUNTER — Other Ambulatory Visit: Payer: Self-pay | Admitting: Physical Medicine & Rehabilitation

## 2014-09-20 VITALS — BP 125/63 | HR 72 | Resp 14

## 2014-09-20 DIAGNOSIS — Z5181 Encounter for therapeutic drug level monitoring: Secondary | ICD-10-CM

## 2014-09-20 DIAGNOSIS — K5903 Drug induced constipation: Secondary | ICD-10-CM

## 2014-09-20 DIAGNOSIS — F1120 Opioid dependence, uncomplicated: Secondary | ICD-10-CM

## 2014-09-20 DIAGNOSIS — F1920 Other psychoactive substance dependence, uncomplicated: Secondary | ICD-10-CM

## 2014-09-20 DIAGNOSIS — G894 Chronic pain syndrome: Secondary | ICD-10-CM

## 2014-09-20 DIAGNOSIS — T402X5A Adverse effect of other opioids, initial encounter: Secondary | ICD-10-CM

## 2014-09-20 DIAGNOSIS — R5381 Other malaise: Secondary | ICD-10-CM | POA: Insufficient documentation

## 2014-09-20 DIAGNOSIS — K5909 Other constipation: Secondary | ICD-10-CM

## 2014-09-20 DIAGNOSIS — M48062 Spinal stenosis, lumbar region with neurogenic claudication: Secondary | ICD-10-CM

## 2014-09-20 DIAGNOSIS — Z79899 Other long term (current) drug therapy: Secondary | ICD-10-CM

## 2014-09-20 DIAGNOSIS — M47817 Spondylosis without myelopathy or radiculopathy, lumbosacral region: Secondary | ICD-10-CM

## 2014-09-20 DIAGNOSIS — M4806 Spinal stenosis, lumbar region: Secondary | ICD-10-CM

## 2014-09-20 MED ORDER — MORPHINE SULFATE ER 100 MG PO TBCR: 100.0000 mg | EXTENDED_RELEASE_TABLET | Freq: Three times a day (TID) | ORAL | Status: DC

## 2014-09-20 MED ORDER — MORPHINE SULFATE 30 MG PO TABS: 30.0000 mg | ORAL_TABLET | Freq: Four times a day (QID) | ORAL | Status: DC | PRN

## 2014-09-20 NOTE — Progress Notes (Signed)
Subjective:    Patient ID: [name], male    DOB: Apr 06, 1961, 54 y.o.   MRN: [id number]  HPI: [name] is a 54 year old male who returns for follow up for chronic pain and medication refill. He says his pain is located in his lower back radiating into his lower extremities laterally. He rates his pain 7. His usual  exercise regime is attending physical therapy at [facility] twice a week, he had the flu last week and was unable to attend physical therapy. He will resume next week. He's using his recumbent bicycle  Twice a week averaging 3-5 miles. Mr. Lamphier thinking he would like to try a different analgesic's will schedule an appointment with Dr. Letta Pate he verbalizes understanding.   Pain Inventory Average Pain 7 Pain Right Now 7 My pain is sharp and burning  In the last 24 hours, has pain interfered with the following? General activity 6 Relation with others 8 Enjoyment of life 9 What TIME of day is your pain at its worst? morning , daytime, evening Sleep (in general) Poor  Pain is worse with: walking, bending and standing Pain improves with: rest, therapy/exercise and medication Relief from Meds: 7  Mobility walk with assistance use a walker how many minutes can you walk? 5-7 ability to climb steps?  yes do you drive?  yes Do you have any goals in this area?  yes  Function employed # of hrs/week . I need assistance with the following:  feeding, dressing, bathing, meal prep, household duties and shopping  Neuro/Psych bowel control problems numbness tingling trouble walking dizziness anxiety  Prior Studies Any changes since last visit?  no  Physicians involved in your care Any changes since last visit?  no   Family History  Problem Relation Age of Onset  . Heart disease Father 49    Died of MI   History   Social History  . Marital Status: Married    Spouse Name: N/A  . Number of Children: N/A  . Years of Education: N/A   Social  History Main Topics  . Smoking status: Current Some Day Smoker -- 1.50 packs/day for 29 years    Types: Cigarettes, E-cigarettes    Last Attempt to Quit: 03/27/2013  . Smokeless tobacco: Never Used     Comment: PT STOPPED SMOKING CIGS 03/27/13. NOW CURRENTLY USING E-CIG  . Alcohol Use: No  . Drug Use: Yes    Special: Marijuana     Comment: Urine showed THC  . Sexual Activity: Not on file   Other Topics Concern  . None   Social History Narrative   Past Surgical History  Procedure Laterality Date  . Hernia repair    . Asd repair, sinus venosus    . Testicle surgery      testicular cancer surgery  . Cardiac defibrillator placement  08/2010  . Left heart catheterization with coronary angiogram N/A 06/14/2014    Procedure: LEFT HEART CATHETERIZATION WITH CORONARY ANGIOGRAM;  Surgeon: Jolaine Artist, MD;  Location: [facility] CATH LAB;  Service: Cardiovascular;  Laterality: N/A;   Past Medical History  Diagnosis Date  . Chronic systolic heart failure     EF 20-25%. s/p ST. Jude ICD  . CAD (coronary artery disease)     Last cath 2/12. 3-v CAD. Failed PCI of distal RCAc  CATH [facility] 4/13 with DES to  LAD X2  . Chronic back pain     lumbar stenosis  . Benign prostatic  hypertrophy   . Depression   . History of testicular cancer   . Narcotic dependence     chronic  . Benzodiazepine dependence     chronic  . Anxiety   . CHF (congestive heart failure)   . DJD (degenerative joint disease)   . Automatic implantable cardiac defibrillator [employer] Judes     Analyze ST   There were no vitals taken for this visit.  Opioid Risk Score:   Fall Risk Score: Moderate Fall Risk (6-13 points)  Review of Systems  Gastrointestinal:       Bowel control problems  Musculoskeletal: Positive for gait problem.  Neurological: Positive for dizziness and numbness.       Tingling  Psychiatric/Behavioral: The patient is nervous/anxious.        Objective:   Physical Exam  Constitutional: He is oriented to  person, place, and time. He appears well-developed and well-nourished.  HENT:  Head: Normocephalic and atraumatic.  Neck: Normal range of motion. Neck supple.  Cardiovascular: Normal rate and regular rhythm.   Pulmonary/Chest: Effort normal and breath sounds normal.  Musculoskeletal:  Normal Muscle Bulk and Muscle Testing reveals: Upper Extremities: Full ROM and Muscle Strength 5/5 Lumbar Paraspinal tenderness: L-3- L-5 Lower Extremities: Full ROM and Muscle Strength 5/5 Arises from chair with ease/ using walker for support. Narrow Based gait  Neurological: He is alert and oriented to person, place, and time.  Skin: Skin is warm and dry.  Psychiatric: He has a normal mood and affect.  Nursing note and vitals reviewed.         Assessment & Plan:  1. Lumbar spinal stenosis, severe.  Refilled: MS Contin 100 mg one tablet three times a day #90 and MSIR 30 mg one tablet every 6 hours as needed for sever pain #120.  2. History of shoulder pain left side. Controlled. No complaints today.  3. Sleep apnea : compliant with CPAP  4. Physical Deconditioning: Continue Physical Therapy  5. Constipation: Continue Linzess  20 minutes of face to face patient care time was spent during this visit. All questions were encouraged and answered.   F/U in 1 month

## 2014-09-21 LAB — PMP ALCOHOL METABOLITE (ETG): Ethyl Glucuronide (EtG): NEGATIVE ng/mL

## 2014-09-23 LAB — OPIATES/OPIOIDS (LC/MS-MS)
Codeine Urine: 192 ng/mL — AB (ref <50)
Hydrocodone: NEGATIVE ng/mL (ref <50)
Hydromorphone: NEGATIVE ng/mL — AB (ref <50)
Morphine Urine: >50000 ng/mL (ref <50)
Norhydrocodone, Ur: NEGATIVE ng/mL (ref <50)
Noroxycodone, Ur: NEGATIVE ng/mL (ref <50)
Oxycodone, ur: NEGATIVE ng/mL (ref <50)
Oxymorphone: NEGATIVE ng/mL (ref <50)

## 2014-09-23 LAB — OXYCODONE, URINE (LC/MS-MS)
Noroxycodone, Ur: NEGATIVE ng/mL (ref <50)
Oxycodone, ur: NEGATIVE ng/mL (ref <50)
Oxymorphone: NEGATIVE ng/mL (ref <50)

## 2014-09-23 LAB — BENZODIAZEPINES (GC/LC/MS), URINE
Alprazolam metabolite (GC/LC/MS), ur confirm: NEGATIVE ng/mL (ref <25)
Clonazepam metabolite (GC/LC/MS), ur confirm: NEGATIVE ng/mL (ref <25)
Flurazepam metabolite (GC/LC/MS), ur confirm: NEGATIVE ng/mL (ref <50)
Lorazepam (GC/LC/MS), ur confirm: 7173 ng/mL (ref <50)
Midazolam (GC/LC/MS), ur confirm: NEGATIVE ng/mL (ref <50)
Nordiazepam (GC/LC/MS), ur confirm: NEGATIVE ng/mL (ref <50)
Oxazepam (GC/LC/MS), ur confirm: NEGATIVE ng/mL (ref <50)
Temazepam (GC/LC/MS), ur confirm: NEGATIVE ng/mL (ref <50)
Triazolam metabolite (GC/LC/MS), ur confirm: NEGATIVE ng/mL (ref <50)

## 2014-09-25 LAB — PRESCRIPTION MONITORING PROFILE (SOLSTAS)
Amphetamine/Meth: NEGATIVE ng/mL
Barbiturate Screen, Urine: NEGATIVE ng/mL
Buprenorphine, Urine: NEGATIVE ng/mL
Cannabinoid Scrn, Ur: NEGATIVE ng/mL
Carisoprodol, Urine: NEGATIVE ng/mL
Cocaine Metabolites: NEGATIVE ng/mL
Creatinine, Urine: 91.22 mg/dL (ref >20.0)
Fentanyl, Ur: NEGATIVE ng/mL
MDMA URINE: NEGATIVE ng/mL
Meperidine, Ur: NEGATIVE ng/mL
Methadone Screen, Urine: NEGATIVE ng/mL
Nitrites, Initial: NEGATIVE ug/mL
Propoxyphene: NEGATIVE ng/mL
Tapentadol, urine: NEGATIVE ng/mL
Tramadol Scrn, Ur: NEGATIVE ng/mL
Zolpidem, Urine: NEGATIVE ng/mL
pH, Initial: 6.7 pH (ref 4.5–8.9)

## 2014-09-28 NOTE — Progress Notes (Signed)
Urine drug screen for this encounter is consistent for prescribed medication 

## 2014-10-08 ENCOUNTER — Ambulatory Visit (INDEPENDENT_AMBULATORY_CARE_PROVIDER_SITE_OTHER): Payer: BLUE CROSS/BLUE SHIELD | Admitting: Critical Care Medicine

## 2014-10-08 ENCOUNTER — Encounter: Payer: Self-pay | Admitting: Critical Care Medicine

## 2014-10-08 VITALS — BP 124/86 | HR 69 | Temp 98.5°F | Ht 72.0 in | Wt 225.6 lb

## 2014-10-08 DIAGNOSIS — R0902 Hypoxemia: Secondary | ICD-10-CM | POA: Diagnosis not present

## 2014-10-08 DIAGNOSIS — Z720 Tobacco use: Secondary | ICD-10-CM

## 2014-10-08 DIAGNOSIS — R0600 Dyspnea, unspecified: Secondary | ICD-10-CM

## 2014-10-08 NOTE — Assessment & Plan Note (Signed)
Concern for hypoxemia but no resting or exertional hypoxemia discerned.  Pt has hypoxia and hyper carbia at night and is on oxygen and ASV cpap Plan No change in medications. You do not need oxygen daytime Stay on ASV/cpap oxygen at night Stay on Qvar twice daily Return 6 months

## 2014-10-08 NOTE — Assessment & Plan Note (Signed)
Ongoing tobacco use Advised to quit smoking Cont qvar

## 2014-10-08 NOTE — Progress Notes (Signed)
Subjective:    Patient ID: [name], male    DOB: [birth year], 54 y.o.   MRN: [id number]  HPI 54 y.o.M with known hx of OSA.   10/08/2014 Chief Complaint  Patient presents with  . Follow-up    Per pt, [facility] would like him to use o2 12 hours per day - would like to discuss this.  Feels weak and SOB worsening over the past few months.   Pt on ASV device from sleep med MD.  4L with AVS at night  Pt wants daytime oxygen.  No new issues. Pt denies any significant sore throat, nasal congestion or excess secretions, fever, chills, sweats, unintended weight loss, pleurtic or exertional chest pain, orthopnea PND, or leg swelling Pt denies any increase in rescue therapy over baseline, denies waking up needing it or having any early am or nocturnal exacerbations of coughing/wheezing/or dyspnea. Pt also denies any obvious fluctuation in symptoms with  weather or environmental change or other alleviating or aggravating factors     Review of Systems Constitutional:   No  weight loss, night sweats,   Fevers, chills, fatigue, lassitude. HEENT:   No headaches,  Difficulty swallowing,  Tooth/dental problems,  Sore throat,                No sneezing, itching, ear ache, nasal congestion,post nasal drip,   CV:  No chest pain,  Orthopnea, PND, swelling in lower extremities, anasarca, dizziness, palpitations  GI  No heartburn, indigestion, abdominal pain, nausea, vomiting, diarrhea, +++change in bowel habits obstipation, no BM in 7days , loss of appetite  Resp:  .  No chest wall deformity  Skin: no rash or lesions.  GU: no dysuria, change in color of urine, no urgency or frequency.  No flank pain.  MS:  Chronic back pain       Objective:   Physical Exam Filed Vitals:   10/08/14 1141  BP: 124/86  Pulse: 69  Temp: 98.5 F (36.9 C)  TempSrc: Oral  Height: 6' (1.829 m)  Weight: 225 lb 9.6 oz (102.331 kg)  SpO2: 96%    Gen: , well-nourished, in no distress,  normal affect  ENT: No  lesions,  mouth clear,  oropharynx clear, no postnasal drip  Neck: No JVD, no TMG, no carotid bruits  Lungs: No use of accessory muscles, no dullness to percussion, no rhonchi  Cardiovascular: RRR, heart sounds normal, no murmur or gallops, no peripheral edema  Abdomen: soft and NT, no HSM,  BS normal  Musculoskeletal: No deformities, no cyanosis or clubbing  Neuro: alert, non focal  Skin: Warm, no lesions or rashes  No desats with exertion on RA     Assessment & Plan:   Hypoxemia Concern for hypoxemia but no resting or exertional hypoxemia discerned.  Pt has hypoxia and hyper carbia at night and is on oxygen and ASV cpap Plan No change in medications. You do not need oxygen daytime Stay on ASV/cpap oxygen at night Stay on Qvar twice daily Return 6 months    Tobacco abuse Ongoing tobacco use Advised to quit smoking Cont qvar      Updated Medication List Outpatient Encounter Prescriptions as of 10/08/2014  Medication Sig  . alfuzosin (UROXATRAL) 10 MG 24 hr tablet Take 10 mg by mouth as needed.   Marland Kitchen aspirin 325 MG tablet Take 325 mg by mouth daily.  . beclomethasone (QVAR) 40 MCG/ACT inhaler Inhale 1 puff into the lungs 2 (two) times daily.  . carvedilol (COREG) 25  MG tablet Take 25 mg by mouth 2 (two) times daily.   . clopidogrel (PLAVIX) 75 MG tablet Take 75 mg by mouth daily.  . diazepam (VALIUM) 10 MG tablet Take 10 mg by mouth 2 (two) times daily.   Marland Kitchen gabapentin (NEURONTIN) 600 MG tablet Take 600 mg by mouth daily as needed.   . Linaclotide (LINZESS) 145 MCG CAPS capsule Take 1 capsule (145 mcg total) by mouth 2 (two) times daily. (Patient taking differently: Take by mouth 2 (two) times daily. 2 tablets in the morning and 1 tablet at night)  . LORazepam (ATIVAN) 1 MG tablet Take 1 mg by mouth 4 (four) times daily as needed. For anxiety  . morphine (MS CONTIN) 100 MG 12 hr tablet Take 1 tablet (100 mg total) by mouth 3 (three) times daily.  Marland Kitchen morphine (MSIR) 30  MG tablet Take 1 tablet (30 mg total) by mouth every 6 (six) hours as needed for severe pain.  . nitroGLYCERIN (NITROSTAT) 0.4 MG SL tablet Place 0.4 mg under the tongue every 5 (five) minutes as needed for chest pain.  . rosuvastatin (CRESTOR) 40 MG tablet Take 40 mg by mouth daily.  . sildenafil (VIAGRA) 100 MG tablet Take 1 tablet (100 mg total) by mouth as needed for erectile dysfunction.  Marland Kitchen spironolactone (ALDACTONE) 25 MG tablet Take 0.5 tablets (12.5 mg total) by mouth daily.  Marland Kitchen testosterone cypionate (DEPOTESTOTERONE CYPIONATE) 200 MG/ML injection Inject into the muscle every 14 (fourteen) days.    Marland Kitchen torsemide (DEMADEX) 20 MG tablet Take 1 tablet (20 mg total) by mouth daily as needed. For swelling in legs  . traZODone (DESYREL) 100 MG tablet Take 100 mg by mouth at bedtime as needed for sleep.   Marland Kitchen ZETIA 10 MG tablet TAKE 1 TABLET EACH DAY.  Marland Kitchen zolpidem (AMBIEN) 10 MG tablet Take 1 tablet by mouth at bedtime as needed.  Marland Kitchen lisinopril (PRINIVIL,ZESTRIL) 10 MG tablet ON HOLD  . [DISCONTINUED] amoxicillin-clavulanate (AUGMENTIN) 500-125 MG per tablet   . [DISCONTINUED] lactulose (CHRONULAC) 10 GM/15ML solution Take 30 mLs (20 g total) by mouth 2 (two) times daily. For constipation (Patient not taking: Reported on 10/08/2014)  . [DISCONTINUED] Zolpidem Tartrate (INTERMEZZO) 3.5 MG SUBL Place 3.5 mg under the tongue daily as needed (sleep).

## 2014-10-08 NOTE — Patient Instructions (Signed)
No change in medications. You do not need oxygen daytime Stay on ASV/cpap oxygen at night Stay on Qvar twice daily Return 6 months

## 2014-10-18 ENCOUNTER — Encounter: Payer: Self-pay | Admitting: Registered Nurse

## 2014-10-18 ENCOUNTER — Encounter: Payer: BLUE CROSS/BLUE SHIELD | Attending: Physical Medicine & Rehabilitation | Admitting: Registered Nurse

## 2014-10-18 ENCOUNTER — Encounter: Payer: BLUE CROSS/BLUE SHIELD | Admitting: Registered Nurse

## 2014-10-18 VITALS — BP 155/65 | HR 82 | Resp 16

## 2014-10-18 DIAGNOSIS — R5381 Other malaise: Secondary | ICD-10-CM | POA: Insufficient documentation

## 2014-10-18 DIAGNOSIS — M47817 Spondylosis without myelopathy or radiculopathy, lumbosacral region: Secondary | ICD-10-CM

## 2014-10-18 DIAGNOSIS — F1920 Other psychoactive substance dependence, uncomplicated: Secondary | ICD-10-CM

## 2014-10-18 DIAGNOSIS — Z5181 Encounter for therapeutic drug level monitoring: Secondary | ICD-10-CM

## 2014-10-18 DIAGNOSIS — Z79899 Other long term (current) drug therapy: Secondary | ICD-10-CM

## 2014-10-18 DIAGNOSIS — G894 Chronic pain syndrome: Secondary | ICD-10-CM | POA: Diagnosis not present

## 2014-10-18 DIAGNOSIS — F1120 Opioid dependence, uncomplicated: Secondary | ICD-10-CM

## 2014-10-18 MED ORDER — MORPHINE SULFATE 30 MG PO TABS: 30.0000 mg | ORAL_TABLET | Freq: Four times a day (QID) | ORAL | Status: DC | PRN

## 2014-10-18 MED ORDER — MORPHINE SULFATE ER 100 MG PO TBCR: 100.0000 mg | EXTENDED_RELEASE_TABLET | Freq: Three times a day (TID) | ORAL | Status: DC

## 2014-10-18 NOTE — Patient Instructions (Signed)
Resume Physical Therapy.  Please Call  [facility]

## 2014-10-18 NOTE — Progress Notes (Signed)
Subjective:    Patient ID: [name], male    DOB: June 09, 1961, 54 y.o.   MRN: [id number]  HPI: [name] is a 54 year old male who returns for follow up for chronic pain and medication refill. He says his pain is located in his lower back radiating into his lower extremities laterally. Also complaining of left shoulder pain. He rates his pain 8. His current exercise regime is performing stretching exercises and weight lifting to upper extremities and using his recumbent bicycle.Twice a week averaging 3-5 miles. [name] is short on his MSIR by one day, he was experiencing pain and states he took more of the medication. Educated on the narcotic policy and instructed to call the office if he's ever experiencing increase intensity of pain. He verbalizes understanding. Wife in room. All questions answered.  Pain Inventory Average Pain 8 Pain Right Now 8 My pain is sharp, burning, dull, stabbing, tingling and aching  In the last 24 hours, has pain interfered with the following? General activity 8 Relation with others 8 Enjoyment of life 8 What TIME of day is your pain at its worst? all Sleep (in general) Poor  Pain is worse with: no answer Pain improves with: no answer Relief from Meds: no answer  Mobility use a walker ability to climb steps?  yes do you drive?  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  no  Physicians involved in your care Any changes since last visit?  no   Family History  Problem Relation Age of Onset  . Heart disease Father 62    Died of MI   History   Social History  . Marital Status: Married    Spouse Name: N/A  . Number of Children: N/A  . Years of Education:  N/A   Social History Main Topics  . Smoking status: Current Some Day Smoker -- 1.50 packs/day for 29 years    Types: Cigarettes, E-cigarettes  . Smokeless tobacco: Never Used     Comment: PT STOPPED SMOKING CIGS 03/27/13. NOW CURRENTLY USING E-CIG and smoking a few cigerettes per week.  . Alcohol Use: No  . Drug Use: Yes    Special: Marijuana     Comment: Urine showed THC  . Sexual Activity: Not on file   Other Topics Concern  . None   Social History Narrative   Past Surgical History  Procedure Laterality Date  . Hernia repair    . Asd repair, sinus venosus    . Testicle surgery      testicular cancer surgery  . Cardiac defibrillator placement  08/2010  . Left heart catheterization with coronary angiogram N/A 06/14/2014    Procedure: LEFT HEART CATHETERIZATION WITH CORONARY ANGIOGRAM;  Surgeon: Jolaine Artist, MD;  Location: [facility] CATH LAB;  Service: Cardiovascular;  Laterality: N/A;   Past Medical History  Diagnosis Date  . Chronic systolic heart failure     EF 20-25%. s/p ST. Jude ICD  . CAD (coronary artery disease)     Last cath 2/12. 3-v CAD. Failed PCI of distal RCAc  CATH [facility] 4/13 with DES to  LAD X2  . Chronic back pain     lumbar stenosis  . Benign prostatic hypertrophy   .  Depression   . History of testicular cancer   . Narcotic dependence     chronic  . Benzodiazepine dependence     chronic  . Anxiety   . CHF (congestive heart failure)   . DJD (degenerative joint disease)   . Automatic implantable cardiac defibrillator [employer]     Analyze ST   BP 155/65 mmHg  Pulse 82  Resp 16  SpO2 95%  Opioid Risk Score:   Fall Risk Score: High Fall Risk (>13 points) (previously educated and given handout)`1  Depression screen PHQ 2/9  Depression screen [facility] 2/9 10/18/2014 09/20/2014 08/13/2014  Decreased Interest 1 1 0  Down, Depressed, Hopeless 0 0 -  PHQ - 2 Score 1 1 0  Altered sleeping 0 0 -  Tired, decreased energy 0 2 -  Change in appetite 0 0 -  Feeling  bad or failure about yourself  0 0 -  Trouble concentrating 0 0 -  Moving slowly or fidgety/restless 0 0 -  Suicidal thoughts 0 0 -  PHQ-9 Score 1 3 -     Review of Systems  Musculoskeletal: Positive for gait problem.  Neurological: Positive for numbness.  All other systems reviewed and are negative.      Objective:   Physical Exam  Constitutional: He is oriented to person, place, and time. He appears well-developed and well-nourished.  HENT:  Head: Normocephalic and atraumatic.  Neck: Normal range of motion. Neck supple.  Cardiovascular: Normal rate, regular rhythm and normal heart sounds.   Pulmonary/Chest: Effort normal and breath sounds normal.  Musculoskeletal:  Normal Muscle Bulk and Muscle Testing Reveals: Upper Extremities: Full ROM and Muscle Strength 5/5 Lumbar Paraspinal Tenderness: L-3- L-5 Lower Extremities: Full ROM and Muscle Strength 5/5 Arises from chair with ease/ Using walker for support  Neurological: He is alert and oriented to person, place, and time.  Skin: Skin is warm and dry.  Psychiatric: He has a normal mood and affect.  Nursing note and vitals reviewed.         Assessment & Plan:  1. Lumbar spinal stenosis, severe.  Refilled: MS Contin 100 mg one tablet three times a day #90 and MSIR 30 mg one tablet every 6 hours as needed for sever pain #120.  2. History of shoulder pain left side. Controlled. No complaints today.  3. Sleep apnea : compliant with CPAP  4. Physical Deconditioning: Continue Physical Therapy  5. Constipation: Continue Linzess  20 minutes of face to face patient care time was spent during this visit. All questions were encouraged and answered.   F/U in 1 month

## 2014-10-24 ENCOUNTER — Telehealth: Payer: Self-pay | Admitting: Pulmonary Disease

## 2014-10-24 NOTE — Telephone Encounter (Signed)
[id number] x1 

## 2014-10-24 NOTE — Telephone Encounter (Signed)
It is listed in the problem list, and all of my notes.  NO, I do not need to write a letter.  His diagnosis is part of the medical record.

## 2014-10-24 NOTE — Telephone Encounter (Signed)
Spoke with pt, states that pt is filing for disability and his dx of severe obstructive sleep apnea does not appear on his health summary in [place].   Pt needs the dx of severe sleep apnea on his medical record there.   If we cannot get this on his MyChart by Friday (he has a disability interview that day) he is requesting a signed letter stating his diagnosis and total treatments with [facility].   I advised pt that his sleep apnea dx is on all of his encounters with [facility].    Delta please advise if you know how to make his dx show up on his MyChart, or if we need to type a letter for him.

## 2014-10-25 NOTE — Telephone Encounter (Signed)
Spoke with patient, aware that his diagnosis is a part of his medical record and if records are pulled they will be able to see every diagnosis. No additional letters stating this are needed. Nothing further needed.

## 2014-11-01 ENCOUNTER — Ambulatory Visit: Payer: BC Managed Care – PPO | Admitting: Pulmonary Disease

## 2014-11-14 ENCOUNTER — Telehealth: Payer: Self-pay | Admitting: Internal Medicine

## 2014-11-14 NOTE — Telephone Encounter (Signed)
NeW Message  Pt wanted to speak w/ Device about remote check on 5/9. Please call back and discuss.

## 2014-11-14 NOTE — Telephone Encounter (Signed)
Informed pt that transmission is automatic and he does not have to do anything. Pt verbalized understanding.

## 2014-11-16 ENCOUNTER — Encounter: Payer: BLUE CROSS/BLUE SHIELD | Attending: Physical Medicine & Rehabilitation

## 2014-11-16 ENCOUNTER — Encounter: Payer: Self-pay | Admitting: Physical Medicine & Rehabilitation

## 2014-11-16 ENCOUNTER — Ambulatory Visit (HOSPITAL_BASED_OUTPATIENT_CLINIC_OR_DEPARTMENT_OTHER): Payer: BLUE CROSS/BLUE SHIELD | Admitting: Physical Medicine & Rehabilitation

## 2014-11-16 VITALS — BP 143/83 | HR 69 | Resp 14

## 2014-11-16 DIAGNOSIS — F1920 Other psychoactive substance dependence, uncomplicated: Secondary | ICD-10-CM

## 2014-11-16 DIAGNOSIS — M549 Dorsalgia, unspecified: Secondary | ICD-10-CM | POA: Diagnosis not present

## 2014-11-16 DIAGNOSIS — G8929 Other chronic pain: Secondary | ICD-10-CM

## 2014-11-16 DIAGNOSIS — R5381 Other malaise: Secondary | ICD-10-CM | POA: Diagnosis present

## 2014-11-16 DIAGNOSIS — F1120 Opioid dependence, uncomplicated: Secondary | ICD-10-CM

## 2014-11-16 DIAGNOSIS — M4806 Spinal stenosis, lumbar region: Secondary | ICD-10-CM

## 2014-11-16 DIAGNOSIS — M48062 Spinal stenosis, lumbar region with neurogenic claudication: Secondary | ICD-10-CM

## 2014-11-16 MED ORDER — MORPHINE SULFATE 30 MG PO TABS: 30.0000 mg | ORAL_TABLET | Freq: Four times a day (QID) | ORAL | Status: DC | PRN

## 2014-11-16 MED ORDER — MORPHINE SULFATE ER 100 MG PO TBCR: 100.0000 mg | EXTENDED_RELEASE_TABLET | Freq: Three times a day (TID) | ORAL | Status: DC

## 2014-11-16 NOTE — Progress Notes (Signed)
Subjective:    Patient ID: [name], male    DOB: Jun 16, 1961, 54 y.o.   MRN: [id number]  HPI   Pain Inventory Average Pain 8 Pain Right Now 8 My pain is sharp, burning, stabbing and aching  In the last 24 hours, has pain interfered with the following? General activity 7 Relation with others 7 Enjoyment of life 7 What TIME of day is your pain at its worst? morning, daytime, evening Sleep (in general) Fair  Pain is worse with: walking, bending and standing Pain improves with: rest and medication Relief from Meds: 6  Mobility use a walker ability to climb steps?  yes do you drive?  yes  Function I need assistance with the following:  meal prep, household duties and shopping Do you have any goals in this area?  yes  Neuro/Psych numbness tingling trouble walking spasms anxiety  Prior Studies Any changes since last visit?  no  Physicians involved in your care Any changes since last visit?  no   Family History  Problem Relation Age of Onset  . Heart disease Father 45    Died of MI   History   Social History  . Marital Status: Married    Spouse Name: N/A  . Number of Children: N/A  . Years of Education: N/A   Social History Main Topics  . Smoking status: Current Some Day Smoker -- 1.50 packs/day for 29 years    Types: Cigarettes, E-cigarettes  . Smokeless tobacco: Never Used     Comment: PT STOPPED SMOKING CIGS 03/27/13. NOW CURRENTLY USING E-CIG and smoking a few cigerettes per week.  . Alcohol Use: No  . Drug Use: Yes    Special: Marijuana     Comment: Urine showed THC  . Sexual Activity: Not on file   Other Topics Concern  . None   Social History Narrative   Past Surgical History  Procedure Laterality Date  . Hernia repair    . Asd repair, sinus venosus    . Testicle surgery      testicular cancer surgery  . Cardiac defibrillator placement  08/2010  . Left heart catheterization with coronary angiogram N/A 06/14/2014    Procedure: LEFT  HEART CATHETERIZATION WITH CORONARY ANGIOGRAM;  Surgeon: Jolaine Artist, MD;  Location: [facility] CATH LAB;  Service: Cardiovascular;  Laterality: N/A;   Past Medical History  Diagnosis Date  . Chronic systolic heart failure     EF 20-25%. s/p ST. Jude ICD  . CAD (coronary artery disease)     Last cath 2/12. 3-v CAD. Failed PCI of distal RCAc  CATH [facility] 4/13 with DES to  LAD X2  . Chronic back pain     lumbar stenosis  . Benign prostatic hypertrophy   . Depression   . History of testicular cancer   . Narcotic dependence     chronic  . Benzodiazepine dependence     chronic  . Anxiety   . CHF (congestive heart failure)   . DJD (degenerative joint disease)   . Automatic implantable cardiac defibrillator [employer]     Analyze ST   BP 143/83 mmHg  Pulse 69  Resp 14  SpO2 96%  Opioid Risk Score:   Fall Risk Score: Moderate Fall Risk (6-13 points)`1  Depression screen PHQ 2/9  Depression screen [facility] 2/9 10/18/2014 09/20/2014 08/13/2014  Decreased Interest 1 1 0  Down, Depressed, Hopeless 0 0 -  PHQ - 2 Score 1 1 0  Altered sleeping 0  0 -  Tired, decreased energy 0 2 -  Change in appetite 0 0 -  Feeling bad or failure about yourself  0 0 -  Trouble concentrating 0 0 -  Moving slowly or fidgety/restless 0 0 -  Suicidal thoughts 0 0 -  PHQ-9 Score 1 3 -     Review of Systems  Constitutional:       Night sweats Weight loss Poor appetite  Respiratory: Positive for apnea.        Respiratory infection  Musculoskeletal: Positive for gait problem.  Neurological: Positive for numbness.       Tingling Spasms   Psychiatric/Behavioral: The patient is nervous/anxious.   All other systems reviewed and are negative.      Objective:   Physical Exam        Assessment & Plan:

## 2014-11-16 NOTE — Patient Instructions (Addendum)
[name] DDS  Discuss reducing bezodiazepines if possible

## 2014-11-16 NOTE — Progress Notes (Signed)
Subjective:    Patient ID: [name], male    DOB: [birth year], 54 y.o.   MRN: [id number]  HPI 54 year old male with cardiomyopathy as well as severe lumbar spinal stenosis. In addition he has anxiety and depression.  He has completed the first stage of his oral surgery but is having difficulty scheduling another dental extraction at [facility]. He would like a recommendation for a local dentist who can perform this given his complex medical history. He continues to see cardiology Both at [facility] as well as inherent [place]. Most recently he has seen Dr. Caryl Comes.   He sees a psychiatrist here in [place] for his anxiety, Dr. Casimiro Needle  We discussed his high-dose morphine in conjunction with benzodiazepine use in light of the recent study documenting the  Increased risk of overdose while using both medications. He states that if he had to choose between the benzodiazepines and morphine he would choose the benzodiazepine as the medication he would wean off of to reduce his overall risk.  He has not had any signs of opiate misuse. Urine toxicology showed a trace of codeine however do not see any cough medicine or other codeine prescribed by any of his other physicians. Has been accurate with his pill counts PDQ 9 Is very low at 1  Back pain radiating to the lower extremities as his primary complaint but has been stable over time   Review of Systems     Objective:   Physical Exam  Constitutional: He is oriented to person, place, and time. He appears well-developed and well-nourished.  HENT:  Head: Normocephalic and atraumatic.  Eyes: Conjunctivae and EOM are normal. Pupils are equal, round, and reactive to light.  Musculoskeletal:       Lumbar back: He exhibits decreased range of motion and tenderness. He exhibits no deformity and no spasm.  Some tenderness at the lumbar paraspinals reduced lumbar flexion and extension lateral rotation and bending. He is a little bit  more comfortable in the forward flexed posture then in the extended posture  Negative straight leg raising    Neurological: He is alert and oriented to person, place, and time. Gait abnormal.  Reflex Scores:      Patellar reflexes are 1+ on the right side and 1+ on the left side.      Achilles reflexes are 1+ on the right side and 1+ on the left side. Lower extremity strength is 5/5 in the hip flexor and extensor ankle dorsiflexors  Patient is able to walk without a walker for short distances no evidence of toe drag or knee instability  He uses a rolling walker for longer distances  Psychiatric: He has a normal mood and affect.  Nursing note and vitals reviewed.         Assessment & Plan:  1. Lumbar spinal stenosis chronic pain, he has not undergone lumbar decompressive surgery secondary to his cardiac Comorbidities He has been evaluated by a spine surgeon in the past.  He is on a high dose narcotic regimen but has not shown any signs of drug misuse. We did discuss weaning off benzodiazepines to reduce interaction risk.  he will need to discuss this with his psychiatrist.  Continue MS Contin 100 mg 3 times a day Continue MSIR 30 mg 4 times a day  Continue opioid monitoring program. This consists of regular clinic visits, examinations, urine drug screen, pill counts as well as use of [place] controlled substance reporting  System.  Over  half of the 25 min visit was spent counseling and coordinating care.Gave him the name of local dentist Dr. Lawana Chambers who does extractions at the OR at both [facility] and [facility] for high risk patients. Patient will explore this option

## 2014-11-19 ENCOUNTER — Encounter: Payer: Self-pay | Admitting: Internal Medicine

## 2014-11-19 ENCOUNTER — Ambulatory Visit (INDEPENDENT_AMBULATORY_CARE_PROVIDER_SITE_OTHER): Payer: BLUE CROSS/BLUE SHIELD | Admitting: *Deleted

## 2014-11-19 ENCOUNTER — Telehealth: Payer: Self-pay | Admitting: Internal Medicine

## 2014-11-19 DIAGNOSIS — I255 Ischemic cardiomyopathy: Secondary | ICD-10-CM | POA: Diagnosis not present

## 2014-11-19 LAB — CUP PACEART REMOTE DEVICE CHECK
Brady Statistic RA Percent Paced: 1 %
Brady Statistic RV Percent Paced: 1 %
Date Time Interrogation Session: 20160511163552
HighPow Impedance: 68 Ohm
Lead Channel Impedance Value: 290 Ohm
Lead Channel Impedance Value: 410 Ohm
Lead Channel Pacing Threshold Amplitude: 0.5 V
Lead Channel Pacing Threshold Pulse Width: 0.5 ms
Lead Channel Sensing Intrinsic Amplitude: 12 mV
Lead Channel Sensing Intrinsic Amplitude: 2.4 mV
Lead Channel Setting Pacing Amplitude: 2 V
Lead Channel Setting Pacing Amplitude: 3 V
Lead Channel Setting Pacing Pulse Width: 0.5 ms
Lead Channel Setting Sensing Sensitivity: 0.5 mV
Pulse Gen Serial Number: 815096
Zone Setting Detection Interval: 270 ms
Zone Setting Detection Interval: 340 ms
Zone Setting Detection Interval: 400 ms

## 2014-11-19 NOTE — Progress Notes (Signed)
Remote ICD transmission.   

## 2014-11-19 NOTE — Telephone Encounter (Signed)
Spoke w/pt in regards to transmission. Transmission was received on 11-19-14. No episodes on device check. Pt feeling very weak today. Spoke w/SK about recent chest pain activity. Per SK if having chest pain needs to be evaluated at hospital. Pt aware of this.

## 2014-11-19 NOTE — Telephone Encounter (Signed)
New message      Pt c/o of Chest Pain: STAT if CP now or developed within 24 hours  1. Are you having CP right now? no  2. Are you experiencing any other symptoms (ex. SOB, nausea, vomiting, sweating)? Lost control of his bowels,sob (on oxygen),  3. How long have you been experiencing CP?  Since early Sunday morning 4. Is your CP continuous or coming and going? Comes and goes  5. Have you taken Nitroglycerin?  Yes---several times yesterday and took aspirin Early Sunday morning chest pain woke him up---- Pt is due for a device check today-----did we get it?  Did it show anything? ?

## 2014-11-19 NOTE — Telephone Encounter (Signed)
Called patient back. Patient stated that he had chest pain yesterday. Patient took nitroglycerin that did help. Patient stated he had to take it several times. Patient is not having chest pain today. Patient stated he did not want to go to ED yesterday, because with his cardiac history they would admit him. Patient's speak is slurred and is hard to understand at time. Patient seemed most concerned about if his remote check for his device went through. Informed patient that device clinic will be contacted to see and would call him back. EP clinic did not received remote check. EP stated they would give patient a call. Patient's next appointment is with CHF clinic on 5/17. Patient wants to be seen earlier. Consulted Truitt Merle NP, she recommend patient be seen at CHF clinic this week since he is not having chest pain at this time. Will send message to [facility] to schedule. Called CHF clinic, patient has the earliest appointment already.

## 2014-11-22 ENCOUNTER — Ambulatory Visit (HOSPITAL_COMMUNITY)
Admission: RE | Admit: 2014-11-22 | Discharge: 2014-11-22 | Disposition: A | Discharge status: Home / Self Care | Payer: BLUE CROSS/BLUE SHIELD | Source: Ambulatory Visit | Attending: Internal Medicine | Admitting: Internal Medicine

## 2014-11-22 ENCOUNTER — Encounter (HOSPITAL_COMMUNITY): Payer: Self-pay

## 2014-11-22 VITALS — BP 118/78 | HR 92 | Wt 221.8 lb

## 2014-11-22 DIAGNOSIS — Z95810 Presence of automatic (implantable) cardiac defibrillator: Secondary | ICD-10-CM | POA: Insufficient documentation

## 2014-11-22 DIAGNOSIS — Z7902 Long term (current) use of antithrombotics/antiplatelets: Secondary | ICD-10-CM | POA: Insufficient documentation

## 2014-11-22 DIAGNOSIS — I25118 Atherosclerotic heart disease of native coronary artery with other forms of angina pectoris: Secondary | ICD-10-CM

## 2014-11-22 DIAGNOSIS — I5022 Chronic systolic (congestive) heart failure: Secondary | ICD-10-CM | POA: Diagnosis not present

## 2014-11-22 DIAGNOSIS — R079 Chest pain, unspecified: Secondary | ICD-10-CM | POA: Diagnosis not present

## 2014-11-22 DIAGNOSIS — Z9981 Dependence on supplemental oxygen: Secondary | ICD-10-CM | POA: Diagnosis not present

## 2014-11-22 DIAGNOSIS — I2510 Atherosclerotic heart disease of native coronary artery without angina pectoris: Secondary | ICD-10-CM | POA: Insufficient documentation

## 2014-11-22 DIAGNOSIS — I5042 Chronic combined systolic (congestive) and diastolic (congestive) heart failure: Secondary | ICD-10-CM | POA: Diagnosis not present

## 2014-11-22 DIAGNOSIS — F329 Major depressive disorder, single episode, unspecified: Secondary | ICD-10-CM | POA: Insufficient documentation

## 2014-11-22 DIAGNOSIS — J9610 Chronic respiratory failure, unspecified whether with hypoxia or hypercapnia: Secondary | ICD-10-CM | POA: Diagnosis not present

## 2014-11-22 DIAGNOSIS — F419 Anxiety disorder, unspecified: Secondary | ICD-10-CM | POA: Insufficient documentation

## 2014-11-22 DIAGNOSIS — G4733 Obstructive sleep apnea (adult) (pediatric): Secondary | ICD-10-CM | POA: Insufficient documentation

## 2014-11-22 DIAGNOSIS — I255 Ischemic cardiomyopathy: Secondary | ICD-10-CM | POA: Diagnosis not present

## 2014-11-22 DIAGNOSIS — Z7982 Long term (current) use of aspirin: Secondary | ICD-10-CM | POA: Insufficient documentation

## 2014-11-22 DIAGNOSIS — Z79899 Other long term (current) drug therapy: Secondary | ICD-10-CM | POA: Insufficient documentation

## 2014-11-22 LAB — COMPREHENSIVE METABOLIC PANEL
ALT: 15 U/L — ABNORMAL LOW (ref 17–63)
AST: 18 U/L (ref 15–41)
Albumin: 4 g/dL (ref 3.5–5.0)
Alkaline Phosphatase: 69 U/L (ref 38–126)
Anion gap: 10 (ref 5–15)
BUN: 8 mg/dL (ref 6–20)
CO2: 29 mmol/L (ref 22–32)
Calcium: 9 mg/dL (ref 8.9–10.3)
Chloride: 102 mmol/L (ref 101–111)
Creatinine, Ser: 0.92 mg/dL (ref 0.61–1.24)
GFR calc Af Amer: >60 mL/min (ref >60)
GFR calc non Af Amer: >60 mL/min (ref >60)
Glucose, Bld: 117 mg/dL — ABNORMAL HIGH (ref 65–99)
Potassium: 3.9 mmol/L (ref 3.5–5.1)
Sodium: 141 mmol/L (ref 135–145)
Total Bilirubin: 0.5 mg/dL (ref 0.3–1.2)
Total Protein: 6.7 g/dL (ref 6.5–8.1)

## 2014-11-22 LAB — CBC
HCT: 55 % — ABNORMAL HIGH (ref 39.0–52.0)
Hemoglobin: 19 g/dL — ABNORMAL HIGH (ref 13.0–17.0)
MCH: 33.7 pg (ref 26.0–34.0)
MCHC: 34.5 g/dL (ref 30.0–36.0)
MCV: 97.5 fL (ref 78.0–100.0)
Platelets: 142 10*3/uL — ABNORMAL LOW (ref 150–400)
RBC: 5.64 MIL/uL (ref 4.22–5.81)
RDW: 13.6 % (ref 11.5–15.5)
WBC: 5.9 10*3/uL (ref 4.0–10.5)

## 2014-11-22 NOTE — Progress Notes (Signed)
Patient ID: [name], male   DOB: April 15, 1961, 54 y.o.   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  Had cath at [facility] 3/15 with stable CAD. Treated medically. Last cath 06/2014 showed:  Left main: Normal LAD coursed to the apex, gave off a moderate-sized diagonal branch in the midsection. There was mild plaque throughout the proximal LAD. In the mid LAD there was a widely patent stent. Just after the stent there was a 40-50% stenosis. There was mild plaque in the diagonal. Left circumflex: gave off a ramus branch, small OM-1. The AV groove circumflex was totally occluded in the midsection which was chronic. In the ramus branch, there was evidence of a previously placed stent which is chronically subtotally occluded with faint flow in the distal vessel. In the OM-1, there was a 20% proximal lesion. The OM-1 gave collaterals to a small OM-2 Right coronary artery:was a large dominant vessel, had diffuse 40% disease throughout the proximal and midsection.In the distal RCA just after the takeoff of the PDA, there was a chronic 99% lesion with a near subtotal occlusion.There were left to right collaterals filling the distal RCA. (Previously failed PCI of distal RCA. Anatomy not favorable for CABG.) LV-gram done in the RAO projection: Ejection fraction = 20-25% with akinesis of the inferior wall and global HK elsewhere.   He is s/p St. Jude  ICD implantation. Was enrolled in Analyze ST.   Most recent Echo 11/13; EF 30-35% mild RV dilation.  Has been followed recently by Dr. Stann Mainland at [facility]. Underwent cath at [facility] in 3/15 and had 2.5x25m Xience DES placed in LAD. Had cath 12/15 here with  stable CAD. Says Dr. RStann Mainlandtold him he had a problem with a "valve in his back". However, I reviewed notes in Care Everywhere and there is no mention of this conversation and echo shows only mild MR. Remains on narcotics for chronic back pain. Over the weekend had woke and lost his bowels and then had chest pain throughout the day and took several sprays of NTG. Has not had CP since.  Says he has been taking linzess for narcotic complication. Says he is always in the yellow zone. Still applying for disability.    [facility] Dr. AAnnabelle Harmanin hematology for polycythemia. Found to hypoxic and have OSA.   ROS: All systems negative except as listed in HPI, PMH and Problem List.  Past Medical History  Diagnosis Date  . Chronic systolic heart failure     EF 20-25%. s/p ST. Jude ICD  . CAD (coronary artery disease)     Last cath 2/12. 3-v CAD. Failed PCI of distal RCAc  CATH [facility] 4/13 with DES to  LAD X2  . Chronic back pain     lumbar stenosis  . Benign prostatic hypertrophy   . Depression   . History of testicular cancer   . Narcotic dependence     chronic  . Benzodiazepine dependence     chronic  . Anxiety   . CHF (congestive heart failure)   . DJD (degenerative joint disease)   . Automatic implantable cardiac defibrillator [place]  Current Outpatient Prescriptions  Medication Sig Dispense Refill  . alfuzosin (UROXATRAL) 10 MG 24 hr tablet Take 10 mg by mouth as needed.     Marland Kitchen aspirin 325 MG tablet Take 325 mg by mouth daily.    . beclomethasone (QVAR) 40 MCG/ACT inhaler Inhale 1 puff into the lungs 2 (two) times daily. 8.7 g 6  . carvedilol (COREG) 25 MG tablet Take 25 mg by mouth 2 (two) times daily.     . clopidogrel (PLAVIX) 75 MG tablet Take 75 mg by mouth daily.    . diazepam (VALIUM) 10 MG tablet Take 10 mg by mouth 2 (two) times daily.     Marland Kitchen gabapentin (NEURONTIN) 600 MG tablet Take 600 mg by mouth daily as needed.      . Linaclotide (LINZESS) 145 MCG CAPS capsule Take 1 capsule (145 mcg total) by mouth 2 (two) times daily. (Patient taking differently: Take by mouth 2 (two) times daily. 2 tablets in the morning and 1 tablet at night) 30 capsule 6  . lisinopril (PRINIVIL,ZESTRIL) 10 MG tablet Take 10 mg by mouth daily.     Marland Kitchen LORazepam (ATIVAN) 1 MG tablet Take 1 mg by mouth 4 (four) times daily as needed. For anxiety    . morphine (MS CONTIN) 100 MG 12 hr tablet Take 1 tablet (100 mg total) by mouth 3 (three) times daily. 90 tablet 0  . morphine (MSIR) 30 MG tablet Take 1 tablet (30 mg total) by mouth every 6 (six) hours as needed for severe pain. 120 tablet 0  . nitroGLYCERIN (NITROSTAT) 0.4 MG SL tablet Place 0.4 mg under the tongue every 5 (five) minutes as needed for chest pain.    . rosuvastatin (CRESTOR) 40 MG tablet Take 40 mg by mouth daily.    . sildenafil (VIAGRA) 100 MG tablet Take 1 tablet (100 mg total) by mouth as needed for erectile dysfunction. 10 tablet 6  . spironolactone (ALDACTONE) 25 MG tablet Take 0.5 tablets (12.5 mg total) by mouth daily. 15 tablet 5  . testosterone cypionate (DEPOTESTOTERONE CYPIONATE) 200 MG/ML injection Inject into the muscle every 14 (fourteen) days.      Marland Kitchen torsemide (DEMADEX) 20 MG tablet Take 1 tablet (20 mg total) by mouth daily as needed. For swelling in legs 30 tablet 6  . traZODone (DESYREL) 100 MG tablet Take 100 mg by mouth at bedtime as needed for sleep.     Marland Kitchen ZETIA 10 MG tablet TAKE 1 TABLET EACH DAY. 30 tablet 6  . zolpidem (AMBIEN) 10 MG tablet Take 1 tablet by mouth at bedtime as needed.  4   No current facility-administered medications for this encounter.    PHYSICAL EXAM: Filed Vitals:   11/22/14 1148  BP: 118/78  Pulse: 92    General:  Normal. No acute distress.  HEENT: normal Neck: supple. JVP flat. Carotids 2+ bilaterally; no bruits. No lymphadenopathy or thryomegaly appreciated. Cor: PMI normal. Regular rate & rhythm. No rubs, gallops,  murmur Lungs: Clear Abdomen: obese soft, nontender, nondistended. No hepatosplenomegaly. No bruits or masses. Good bowel sounds. Extremities: no cyanosis, clubbing, rash, edema Neuro: alert & oriented x 3, cranial nerves grossly intact. Moves all 4 extremities w/o difficulty. Affect pleasant.  ECG: NSR 79. Lateral TWI unchanged  ASSESSMENT & PLAN: 1. CP, unclear etiology. Now resolved. ECG ok. Will get a troponin to look for acute/subacute ischemia. Doubt this is ACS. We discussed adding Imdur or Ranexa but he does not want to stop PDE-5. 2. CAD with  ischemic CM 3. Chronic systolic HF EF 11-57% --Stable NYHA II-III. Volume status looks good. Continue current meds 4. Severe back pain 5. Chronic respiratory failure on home O2 6. OSA on CPAP   Rasheena Talmadge,MD 12:03 PM

## 2014-11-22 NOTE — Patient Instructions (Signed)
Labs today  We will contact you in 4-6 months to schedule your next appointment.

## 2014-11-27 ENCOUNTER — Encounter (HOSPITAL_COMMUNITY): Payer: BLUE CROSS/BLUE SHIELD

## 2014-12-03 ENCOUNTER — Encounter: Payer: Self-pay | Admitting: Cardiology

## 2014-12-04 ENCOUNTER — Telehealth: Payer: Self-pay

## 2014-12-04 ENCOUNTER — Ambulatory Visit (INDEPENDENT_AMBULATORY_CARE_PROVIDER_SITE_OTHER): Payer: BLUE CROSS/BLUE SHIELD | Admitting: Emergency Medicine

## 2014-12-04 ENCOUNTER — Telehealth: Payer: Self-pay | Admitting: Gastroenterology

## 2014-12-04 VITALS — BP 140/100 | HR 64 | Temp 98.6°F | Resp 16 | Ht 71.5 in | Wt 221.0 lb

## 2014-12-04 DIAGNOSIS — L723 Sebaceous cyst: Secondary | ICD-10-CM | POA: Diagnosis not present

## 2014-12-04 DIAGNOSIS — T148XXA Other injury of unspecified body region, initial encounter: Secondary | ICD-10-CM

## 2014-12-04 DIAGNOSIS — T148 Other injury of unspecified body region: Secondary | ICD-10-CM

## 2014-12-04 LAB — POCT CBC
Granulocyte percent: 73.4 %G (ref 37–80)
HCT, POC: 58.3 % — AB (ref 43.5–53.7)
Hemoglobin: 18.8 g/dL — AB (ref 14.1–18.1)
Lymph, poc: 1.7 (ref 0.6–3.4)
MCH, POC: 31.8 pg — AB (ref 27–31.2)
MCHC: 32.3 g/dL (ref 31.8–35.4)
MCV: 98.5 fL — AB (ref 80–97)
MID (cbc): 0.6 (ref 0–0.9)
MPV: 7.7 fL (ref 0–99.8)
POC Granulocyte: 6.5 (ref 2–6.9)
POC LYMPH PERCENT: 19.5 %L (ref 10–50)
POC MID %: 7.1 %M (ref 0–12)
Platelet Count, POC: 139 10*3/uL — AB (ref 142–424)
RBC: 5.92 M/uL (ref 4.69–6.13)
RDW, POC: 14.8 %
WBC: 8.8 10*3/uL (ref 4.6–10.2)

## 2014-12-04 LAB — COMPREHENSIVE METABOLIC PANEL
ALT: 15 U/L (ref 0–53)
AST: 16 U/L (ref 0–37)
Albumin: 4.7 g/dL (ref 3.5–5.2)
Alkaline Phosphatase: 73 U/L (ref 39–117)
BUN: 15 mg/dL (ref 6–23)
CO2: 27 mEq/L (ref 19–32)
Calcium: 9.4 mg/dL (ref 8.4–10.5)
Chloride: 101 mEq/L (ref 96–112)
Creat: 0.97 mg/dL (ref 0.50–1.35)
Glucose, Bld: 120 mg/dL — ABNORMAL HIGH (ref 70–99)
Potassium: 4.4 mEq/L (ref 3.5–5.3)
Sodium: 139 mEq/L (ref 135–145)
Total Bilirubin: 0.6 mg/dL (ref 0.2–1.2)
Total Protein: 7.2 g/dL (ref 6.0–8.3)

## 2014-12-04 NOTE — Telephone Encounter (Signed)
Spoke with the patient who states he is at an Urgent Care right now. Ask about what symptom he is having and he says a nurse called him to tell him he needed a CT "right away". Unclear from the very brief history he is able to give me, what problem he is having. He will see the Urgent Care doctor for what he feels an urgent medical problem he will call me tomorrow.

## 2014-12-04 NOTE — Progress Notes (Signed)
Subjective:  Patient ID: [name], male    DOB: 1961-04-18  Age: 54 y.o. MRN: [id number]  CC: Abdominal Pain   HPI [name] presents for evaluation of a bruise on his right side. He also has a mass that he attributes this as a cause of the bruising on his right flank. He says that the mass was never there before he noticed bruising. The mass is nontender. He denies any history of injury or overuse causing the bruising. The patient is on Plavix.  His old medical record was his extensive and was reviewed he is concerned that his bruising is related to underlying liver problem. He said that he does have a history of liver cyst. Felt to be benign in the past.  He says that the mass causing him extreme pain that he is feeling through his morphine that he is on.  He has no fever chills nausea vomiting or stool change no icterus. Denies any abdominal pain. Outpatient Prescriptions Prior to Visit  Medication Sig Dispense Refill  . alfuzosin (UROXATRAL) 10 MG 24 hr tablet Take 10 mg by mouth as needed.     Marland Kitchen aspirin 325 MG tablet Take 325 mg by mouth daily.    . beclomethasone (QVAR) 40 MCG/ACT inhaler Inhale 1 puff into the lungs 2 (two) times daily. 8.7 g 6  . carvedilol (COREG) 25 MG tablet Take 25 mg by mouth 2 (two) times daily.     . clopidogrel (PLAVIX) 75 MG tablet Take 75 mg by mouth daily.    . diazepam (VALIUM) 10 MG tablet Take 10 mg by mouth 2 (two) times daily.     Marland Kitchen gabapentin (NEURONTIN) 600 MG tablet Take 600 mg by mouth daily as needed.     . Linaclotide (LINZESS) 145 MCG CAPS capsule Take 1 capsule (145 mcg total) by mouth 2 (two) times daily. (Patient taking differently: Take by mouth 2 (two) times daily. 2 tablets in the morning and 1 tablet at night) 30 capsule 6  . lisinopril (PRINIVIL,ZESTRIL) 10 MG tablet Take 10 mg by mouth daily.     Marland Kitchen LORazepam (ATIVAN) 1 MG tablet Take 1 mg by mouth 4 (four) times daily as needed. For anxiety    . morphine (MS CONTIN) 100 MG  12 hr tablet Take 1 tablet (100 mg total) by mouth 3 (three) times daily. 90 tablet 0  . morphine (MSIR) 30 MG tablet Take 1 tablet (30 mg total) by mouth every 6 (six) hours as needed for severe pain. 120 tablet 0  . nitroGLYCERIN (NITROSTAT) 0.4 MG SL tablet Place 0.4 mg under the tongue every 5 (five) minutes as needed for chest pain.    . rosuvastatin (CRESTOR) 40 MG tablet Take 40 mg by mouth daily.    . sildenafil (VIAGRA) 100 MG tablet Take 1 tablet (100 mg total) by mouth as needed for erectile dysfunction. 10 tablet 6  . spironolactone (ALDACTONE) 25 MG tablet Take 0.5 tablets (12.5 mg total) by mouth daily. 15 tablet 5  . testosterone cypionate (DEPOTESTOTERONE CYPIONATE) 200 MG/ML injection Inject into the muscle every 14 (fourteen) days.      Marland Kitchen torsemide (DEMADEX) 20 MG tablet Take 1 tablet (20 mg total) by mouth daily as needed. For swelling in legs 30 tablet 6  . traZODone (DESYREL) 100 MG tablet Take 100 mg by mouth at bedtime as needed for sleep.     Marland Kitchen ZETIA 10 MG tablet TAKE 1 TABLET EACH DAY. 30 tablet  6  . zolpidem (AMBIEN) 10 MG tablet Take 1 tablet by mouth at bedtime as needed.  4   No facility-administered medications prior to visit.    ROS Review of Systems  Constitutional: Negative for fever, chills and appetite change.  HENT: Negative for congestion, ear pain, postnasal drip, sinus pressure and sore throat.   Eyes: Negative for pain and redness.  Respiratory: Negative for cough, shortness of breath and wheezing.   Cardiovascular: Negative for leg swelling.  Gastrointestinal: Negative for nausea, vomiting, abdominal pain, diarrhea, constipation and blood in stool.  Endocrine: Negative for polyuria.  Genitourinary: Negative for dysuria, urgency, frequency and flank pain.  Musculoskeletal: Negative for gait problem.  Skin: Negative for rash.  Neurological: Negative for weakness and headaches.  Hematological: Bruises/bleeds easily.  Psychiatric/Behavioral: Negative  for confusion and decreased concentration. The patient is not nervous/anxious.     Objective:  BP 140/100 mmHg  Pulse 64  Temp(Src) 98.6 F (37 C) (Oral)  Resp 16  Ht 5' 11.5" (1.816 m)  Wt 221 lb (100.245 kg)  BMI 30.40 kg/m2  SpO2 97%  BP Readings from Last 3 Encounters:  12/04/14 140/100  11/16/14 143/83  10/18/14 155/65    Wt Readings from Last 3 Encounters:  12/04/14 221 lb (100.245 kg)  10/08/14 225 lb 9.6 oz (102.331 kg)  09/04/14 232 lb (105.235 kg)    Physical Exam  Constitutional: He appears well-developed and well-nourished. No distress.  Pulmonary/Chest: Effort normal and breath sounds normal.  Abdominal: Soft. Bowel sounds are normal. He exhibits mass (Small smooth symmetrical round sebaceous cyst on the right flank is nontender. And freely mobile.).  Skin: Skin is warm and dry. Rash (He has a small area of ecchymosis in the right flank just posterior to sebaceous cyst.) noted.    Lab Results  Component Value Date   WBC 5.9 11/22/2014   HGB 19.0* 11/22/2014   HCT 55.0* 11/22/2014   PLT 142* 11/22/2014   GLUCOSE 117* 11/22/2014   CHOL 166 08/13/2014   TRIG 148 08/13/2014   HDL 40 08/13/2014   LDLCALC 96 08/13/2014   ALT 15* 11/22/2014   AST 18 11/22/2014   NA 141 11/22/2014   K 3.9 11/22/2014   CL 102 11/22/2014   CREATININE 0.92 11/22/2014   BUN 8 11/22/2014   CO2 29 11/22/2014   TSH 0.497 02/10/2011   PSA 0.40 06/11/2011   INR 1.06 06/14/2014   HGBA1C 5.9* 08/13/2014      Assessment & Plan:   Cristina was seen today for abdominal pain.  Diagnoses and all orders for this visit:  Contusion Orders: -     POCT CBC -     Comprehensive metabolic panel   I am having Mr. Corkery maintain his testosterone cypionate, diazepam, LORazepam, torsemide, traZODone, alfuzosin, nitroGLYCERIN, carvedilol, beclomethasone, sildenafil, spironolactone, aspirin, gabapentin, clopidogrel, lisinopril, rosuvastatin, ZETIA, Linaclotide, zolpidem, morphine,  morphine, and amoxicillin-clavulanate.  Meds ordered this encounter  Medications  . amoxicillin-clavulanate (AUGMENTIN) 875-125 MG per tablet    Sig: Take 1 tablet by mouth 2 (two) times daily.   the patient is very dissatisfied of my explanation that he has sebaceous cyst is probably been there for an extended period of time rather than being the precipitator the ecchymosis on his side. He denies any history of injury to his side. That he is on Plavix and any minor injury that he might not be aware of cause the problem bruising.   Follow-up: No Follow-up on file.  Roselee Culver, MD  Results for orders placed or performed in visit on 12/04/14  POCT CBC  Result Value Ref Range   WBC 8.8 4.6 - 10.2 K/uL   Lymph, poc 1.7 0.6 - 3.4   POC LYMPH PERCENT 19.5 10 - 50 %L   MID (cbc) 0.6 0 - 0.9   POC MID % 7.1 0 - 12 %M   POC Granulocyte 6.5 2 - 6.9   Granulocyte percent 73.4 37 - 80 %G   RBC 5.92 4.69 - 6.13 M/uL   Hemoglobin 18.8 (A) 14.1 - 18.1 g/dL   HCT, POC 58.3 (A) 43.5 - 53.7 %   MCV 98.5 (A) 80 - 97 fL   MCH, POC 31.8 (A) 27 - 31.2 pg   MCHC 32.3 31.8 - 35.4 g/dL   RDW, POC 14.8 %   Platelet Count, POC 139 (A) 142 - 424 K/uL   MPV 7.7 0 - 99.8 fL

## 2014-12-05 ENCOUNTER — Encounter: Payer: Self-pay | Admitting: Family Medicine

## 2014-12-14 ENCOUNTER — Encounter: Payer: Self-pay | Admitting: Registered Nurse

## 2014-12-14 ENCOUNTER — Encounter: Payer: BLUE CROSS/BLUE SHIELD | Attending: Physical Medicine & Rehabilitation | Admitting: Registered Nurse

## 2014-12-14 VITALS — BP 136/86 | HR 88 | Resp 14

## 2014-12-14 DIAGNOSIS — G894 Chronic pain syndrome: Secondary | ICD-10-CM | POA: Diagnosis not present

## 2014-12-14 DIAGNOSIS — F1920 Other psychoactive substance dependence, uncomplicated: Secondary | ICD-10-CM

## 2014-12-14 DIAGNOSIS — Z5181 Encounter for therapeutic drug level monitoring: Secondary | ICD-10-CM | POA: Diagnosis not present

## 2014-12-14 DIAGNOSIS — M47817 Spondylosis without myelopathy or radiculopathy, lumbosacral region: Secondary | ICD-10-CM | POA: Diagnosis not present

## 2014-12-14 DIAGNOSIS — Z79899 Other long term (current) drug therapy: Secondary | ICD-10-CM

## 2014-12-14 DIAGNOSIS — R5381 Other malaise: Secondary | ICD-10-CM | POA: Insufficient documentation

## 2014-12-14 DIAGNOSIS — F1120 Opioid dependence, uncomplicated: Secondary | ICD-10-CM

## 2014-12-14 MED ORDER — MORPHINE SULFATE ER 100 MG PO TBCR: 100.0000 mg | EXTENDED_RELEASE_TABLET | Freq: Three times a day (TID) | ORAL | Status: DC

## 2014-12-14 MED ORDER — MORPHINE SULFATE 30 MG PO TABS: 30.0000 mg | ORAL_TABLET | Freq: Four times a day (QID) | ORAL | Status: DC | PRN

## 2014-12-14 NOTE — Progress Notes (Signed)
Subjective:    Patient ID: [name], male    DOB: 1961/01/06, 54 y.o.   MRN: [id number]  HPI: [name] is a 54 year old male who returns for follow up for chronic pain and medication refill. He says his pain is located in his lower back and lower extremities. He rates his pain 8. His current exercise regime is performing stretching exercises and weight lifting to upper extremities and using his recumbent bicycle.Twice a week averaging 3-5 miles. He states he went to urgent care last month due to right lower quadrant swelling, he has a few papules noted. He will follow up with his PCP he verbalizes understanding. Also states his mother has been ill, emotional support given.  Pain Inventory Average Pain 7 Pain Right Now 8 My pain is sharp and burning  In the last 24 hours, has pain interfered with the following? General activity 4 Relation with others 3 Enjoyment of life 5 What TIME of day is your pain at its worst? all Sleep (in general) Good  Pain is worse with: walking, bending, standing and some activites Pain improves with: rest, therapy/exercise and medication Relief from Meds: 5  Mobility walk with assistance use a walker ability to climb steps?  yes do you drive?  yes  Function retired I need assistance with the following:  dressing, meal prep, household duties and shopping  Neuro/Psych weakness numbness trouble walking anxiety  Prior Studies Any changes since last visit?  no  Physicians involved in your care Any changes since last visit?  no   Family History  Problem Relation Age of Onset  . Heart disease Father 45    Died of MI   History   Social History  . Marital Status: Married    Spouse Name: N/A  . Number of Children: N/A  . Years of Education: N/A   Social History Main Topics  . Smoking status: Current Some Day Smoker -- 1.50 packs/day for 29 years    Types: Cigarettes, E-cigarettes  . Smokeless tobacco: Never Used   Comment: PT STOPPED SMOKING CIGS 03/27/13. NOW CURRENTLY USING E-CIG and smoking a few cigerettes per week.  . Alcohol Use: No  . Drug Use: Yes    Special: Marijuana     Comment: Urine showed THC  . Sexual Activity: Not on file   Other Topics Concern  . None   Social History Narrative   Past Surgical History  Procedure Laterality Date  . Hernia repair    . Asd repair, sinus venosus    . Testicle surgery      testicular cancer surgery  . Cardiac defibrillator placement  08/2010  . Left heart catheterization with coronary angiogram N/A 06/14/2014    Procedure: LEFT HEART CATHETERIZATION WITH CORONARY ANGIOGRAM;  Surgeon: Jolaine Artist, MD;  Location: [facility] CATH LAB;  Service: Cardiovascular;  Laterality: N/A;   Past Medical History  Diagnosis Date  . Chronic systolic heart failure     EF 20-25%. s/p ST. Jude ICD  . CAD (coronary artery disease)     Last cath 2/12. 3-v CAD. Failed PCI of distal RCAc  CATH [facility] 4/13 with DES to  LAD X2  . Chronic back pain     lumbar stenosis  . Benign prostatic hypertrophy   . Depression   . History of testicular cancer   . Narcotic dependence     chronic  . Benzodiazepine dependence     chronic  . Anxiety   .  CHF (congestive heart failure)   . DJD (degenerative joint disease)   . Automatic implantable cardiac defibrillator [employer]     Analyze ST   BP 136/86 mmHg  Pulse 88  Resp 14  SpO2 96%  Opioid Risk Score:   Fall Risk Score: Moderate Fall Risk (6-13 points)`1  Depression screen PHQ 2/9  Depression screen [facility] 2/9 12/04/2014 10/18/2014 09/20/2014 08/13/2014  Decreased Interest 0 1 1 0  Down, Depressed, Hopeless 0 0 0 -  PHQ - 2 Score 0 1 1 0  Altered sleeping - 0 0 -  Tired, decreased energy - 0 2 -  Change in appetite - 0 0 -  Feeling bad or failure about yourself  - 0 0 -  Trouble concentrating - 0 0 -  Moving slowly or fidgety/restless - 0 0 -  Suicidal thoughts - 0 0 -  PHQ-9 Score - 1 3 -      Review of Systems    HENT: Negative.   Eyes: Negative.   Respiratory: Negative.   Cardiovascular: Negative.   Gastrointestinal: Negative.   Endocrine: Negative.   Skin: Negative.   Neurological: Positive for weakness and numbness.  Hematological: Negative.   Psychiatric/Behavioral: The patient is nervous/anxious.   All other systems reviewed and are negative.      Objective:   Physical Exam  Constitutional: He is oriented to person, place, and time. He appears well-developed and well-nourished.  HENT:  Head: Normocephalic and atraumatic.  Neck: Normal range of motion. Neck supple.  Cardiovascular: Normal rate and regular rhythm.   Pulmonary/Chest: Effort normal and breath sounds normal.  Musculoskeletal:  Normal Muscle Bulk and Muscle Testing Reveals: Upper Extremities: Full ROM and Muscle Strength 5/5 Thoracic Paraspinal Tenderness: T-11- T-12 Lumbar Paraspinal Tenderness: L-3- L-5 Lower Extremities: Full ROM and Muscle Strength 5/5 Arises from chair with ease/ using cadillac walker for support Narrow Based Gait   Neurological: He is alert and oriented to person, place, and time.  Skin: Skin is warm and dry.  Psychiatric: He has a normal mood and affect.  Nursing note and vitals reviewed.         Assessment & Plan:  1. Lumbar spinal stenosis, severe.  Refilled: MS Contin 100 mg one tablet three times a day #90 and MSIR 30 mg one tablet every 6 hours as needed for sever pain #120. Second script given to accommodate scheduled appointment. 2. History of shoulder pain left side. Controlled. No complaints today.  3. Sleep apnea : compliant with CPAP  4. Constipation: Continue Linzess  20 minutes of face to face patient care time was spent during this visit. All questions were encouraged and answered.   F/U in 1 month

## 2014-12-19 ENCOUNTER — Encounter: Payer: Self-pay | Admitting: Cardiology

## 2014-12-25 NOTE — Telephone Encounter (Signed)
error 

## 2015-01-02 ENCOUNTER — Other Ambulatory Visit: Payer: Self-pay | Admitting: Internal Medicine

## 2015-01-02 ENCOUNTER — Other Ambulatory Visit (HOSPITAL_COMMUNITY): Payer: Self-pay | Admitting: Internal Medicine

## 2015-01-02 ENCOUNTER — Other Ambulatory Visit: Payer: Self-pay | Admitting: Critical Care Medicine

## 2015-01-03 ENCOUNTER — Other Ambulatory Visit (HOSPITAL_COMMUNITY): Payer: Self-pay | Admitting: *Deleted

## 2015-01-03 ENCOUNTER — Other Ambulatory Visit: Payer: Self-pay

## 2015-01-03 MED ORDER — SILDENAFIL CITRATE 100 MG PO TABS: ORAL_TABLET | ORAL | Status: DC

## 2015-01-03 MED ORDER — SPIRONOLACTONE 25 MG PO TABS: 12.5000 mg | ORAL_TABLET | Freq: Every day | ORAL | Status: DC

## 2015-01-03 MED ORDER — CLOPIDOGREL BISULFATE 75 MG PO TABS: ORAL_TABLET | ORAL | Status: DC

## 2015-01-25 ENCOUNTER — Ambulatory Visit: Payer: BLUE CROSS/BLUE SHIELD | Admitting: Registered Nurse

## 2015-01-30 ENCOUNTER — Ambulatory Visit (INDEPENDENT_AMBULATORY_CARE_PROVIDER_SITE_OTHER): Payer: BLUE CROSS/BLUE SHIELD | Admitting: Family Medicine

## 2015-01-30 VITALS — BP 118/80 | HR 77 | Temp 98.3°F | Resp 16 | Ht 71.5 in | Wt 212.6 lb

## 2015-01-30 DIAGNOSIS — R300 Dysuria: Secondary | ICD-10-CM

## 2015-01-30 DIAGNOSIS — J0100 Acute maxillary sinusitis, unspecified: Secondary | ICD-10-CM

## 2015-01-30 LAB — POCT UA - MICROSCOPIC ONLY
Casts, Ur, LPF, POC: NEGATIVE
Crystals, Ur, HPF, POC: NEGATIVE
Mucus, UA: NEGATIVE
WBC, Ur, HPF, POC: NEGATIVE
Yeast, UA: NEGATIVE

## 2015-01-30 LAB — POCT URINALYSIS DIPSTICK
Glucose, UA: NEGATIVE
Leukocytes, UA: NEGATIVE
Nitrite, UA: NEGATIVE
Protein, UA: NEGATIVE
Spec Grav, UA: 1.02
Urobilinogen, UA: 2
pH, UA: 6

## 2015-01-30 MED ORDER — AMOXICILLIN-POT CLAVULANATE 875-125 MG PO TABS: 1.0000 | ORAL_TABLET | Freq: Two times a day (BID) | ORAL | Status: DC

## 2015-01-30 MED ORDER — MAGIC MOUTHWASH W/LIDOCAINE: 5.0000 mL | Freq: Four times a day (QID) | ORAL | Status: DC | PRN

## 2015-01-30 NOTE — Progress Notes (Signed)
Chief Complaint:  Chief Complaint  Patient presents with  . tongue problem    swollen,cut on right side of tongue,lots of drooling, worst over last two weeks  . Dysuria    HPI: [name] is a 54 y.o. male who reports to [facility] today complaining of : 1. Dysuria-wife had UTI and had sex with her and wants to know if he has UTI as well. No fevers or chills.  2. Tongue swelling on right side and also drooling only at night , facial pain no fevers or chills, he has sinus issues. He has alsot of dental work thatneeds to get done at [facility]. Mom died recently, funeral arrangements are currently ongoing. Does not drool during the day.  Past Medical History  Diagnosis Date  . Chronic systolic heart failure     EF 20-25%. s/p ST. Jude ICD  . CAD (coronary artery disease)     Last cath 2/12. 3-v CAD. Failed PCI of distal RCAc  CATH [facility] 4/13 with DES to  LAD X2  . Chronic back pain     lumbar stenosis  . Benign prostatic hypertrophy   . Depression   . History of testicular cancer   . Narcotic dependence     chronic  . Benzodiazepine dependence     chronic  . Anxiety   . CHF (congestive heart failure)   . DJD (degenerative joint disease)   . Automatic implantable cardiac defibrillator [employer]     Analyze ST   Past Surgical History  Procedure Laterality Date  . Hernia repair    . Asd repair, sinus venosus    . Testicle surgery      testicular cancer surgery  . Cardiac defibrillator placement  08/2010  . Left heart catheterization with coronary angiogram N/A 06/14/2014    Procedure: LEFT HEART CATHETERIZATION WITH CORONARY ANGIOGRAM;  Surgeon: Jolaine Artist, MD;  Location: [facility] CATH LAB;  Service: Cardiovascular;  Laterality: N/A;   History   Social History  . Marital Status: Married    Spouse Name: N/A  . Number of Children: N/A  . Years of Education: N/A   Social History Main Topics  . Smoking status: Current Some Day Smoker -- 1.50 packs/day for 29 years   Types: Cigarettes, E-cigarettes  . Smokeless tobacco: Never Used     Comment: PT STOPPED SMOKING CIGS 03/27/13. NOW CURRENTLY USING E-CIG and smoking a few cigerettes per week.  . Alcohol Use: No  . Drug Use: Yes    Special: Marijuana     Comment: Urine showed THC  . Sexual Activity: Not on file   Other Topics Concern  . None   Social History Narrative   Family History  Problem Relation Age of Onset  . Heart disease Father 21    Died of MI   Allergies  Allergen Reactions  . Darvocet [Propoxyphene N-Acetaminophen] Anaphylaxis    Throat closes  . Propoxyphene Anaphylaxis and Swelling  . Levaquin [Levofloxacin In D5w]     Joint aches   Prior to Admission medications   Medication Sig Start Date End Date Taking? Authorizing Provider  alfuzosin (UROXATRAL) 10 MG 24 hr tablet Take 10 mg by mouth as needed.  03/17/13  Yes Historical Provider, MD  amoxicillin-clavulanate (AUGMENTIN) 875-125 MG per tablet Take 1 tablet by mouth 2 (two) times daily.   Yes Historical Provider, MD  aspirin 325 MG tablet Take 325 mg by mouth daily.   Yes Historical Provider, MD  carvedilol (COREG) 25 MG tablet Take 25 mg by mouth 2 (two) times daily.    Yes Historical Provider, MD  clopidogrel (PLAVIX) 75 MG tablet TAKE 1 TABLET EACH DAY. 01/03/15  Yes Jolaine Artist, MD  diazepam (VALIUM) 10 MG tablet Take 10 mg by mouth 2 (two) times daily.  12/05/10  Yes Clovis Cao, MD  gabapentin (NEURONTIN) 600 MG tablet Take 600 mg by mouth daily as needed.    Yes Historical Provider, MD  Linaclotide (LINZESS) 145 MCG CAPS capsule Take 1 capsule (145 mcg total) by mouth 2 (two) times daily. Patient taking differently: Take by mouth 2 (two) times daily. 2 tablets in the morning and 1 tablet at night 09/04/14  Yes Inda Castle, MD  lisinopril (PRINIVIL,ZESTRIL) 10 MG tablet Take 10 mg by mouth daily.    Yes Historical Provider, MD  LORazepam (ATIVAN) 1 MG tablet Take 1 mg by mouth 4 (four) times daily as needed. For  anxiety   Yes Historical Provider, MD  morphine (MS CONTIN) 100 MG 12 hr tablet Take 1 tablet (100 mg total) by mouth 3 (three) times daily. 12/14/14  Yes Bayard Hugger, NP  morphine (MSIR) 30 MG tablet Take 1 tablet (30 mg total) by mouth every 6 (six) hours as needed for severe pain. 12/14/14  Yes Bayard Hugger, NP  nitroGLYCERIN (NITROSTAT) 0.4 MG SL tablet Place 0.4 mg under the tongue every 5 (five) minutes as needed for chest pain.   Yes Historical Provider, MD  QVAR 40 MCG/ACT inhaler INHALE 1 PUFF TWICE DAILY AS DIRECTED. 01/03/15  Yes Elsie Stain, MD  rosuvastatin (CRESTOR) 40 MG tablet Take 40 mg by mouth daily.   Yes Historical Provider, MD  sildenafil (VIAGRA) 100 MG tablet TAKE ONE TABLET BY MOUTH AS NEEDED FOR ERECTILE DYSFUNCTION. 01/03/15  Yes Jolaine Artist, MD  spironolactone (ALDACTONE) 25 MG tablet Take 0.5 tablets (12.5 mg total) by mouth daily. 01/03/15  Yes Deboraha Sprang, MD  testosterone cypionate (DEPOTESTOTERONE CYPIONATE) 200 MG/ML injection Inject into the muscle every 14 (fourteen) days.     Yes Historical Provider, MD  torsemide (DEMADEX) 20 MG tablet Take 1 tablet (20 mg total) by mouth daily as needed. For swelling in legs 01/11/13  Yes Jolaine Artist, MD  traZODone (DESYREL) 100 MG tablet Take 100 mg by mouth at bedtime as needed for sleep.    Yes Historical Provider, MD  ZETIA 10 MG tablet TAKE 1 TABLET EACH DAY. 06/26/14  Yes Jolaine Artist, MD  zolpidem (AMBIEN) 10 MG tablet Take 1 tablet by mouth at bedtime as needed. 09/15/14  Yes Historical Provider, MD     ROS: The patient denies fevers, chills, night sweats, unintentional weight loss, chest pain, palpitations, wheezing, dyspnea on exertion, nausea, vomiting, abdominal pain, dysuria, hematuria, melena, numbness, weakness, or tingling.  All other systems have been reviewed and were otherwise negative with the exception of those mentioned in the HPI and as above.    PHYSICAL EXAM: Filed Vitals:    01/30/15 1420  BP: 118/80  Pulse: 77  Temp: 98.3 F (36.8 C)  Resp: 16   Body mass index is 29.24 kg/(m^2).   General: Alert, no acute distress HEENT:  Normocephalic, atraumatic, oropharynx patent. EOMI, PERRLA No obious lesions or masses or slaivary stones. Poor denittion, + sinus tenderness, TM nl Cardiovascular:  Regular rate and rhythm,  No pedal edema.  Respiratory: Clear to auscultation bilaterally.  No wheezes, rales, or rhonchi.  No cyanosis, no use of accessory musculature Abdominal: No organomegaly, abdomen is soft and non-tender, positive bowel sounds. No masses. Skin: No rashes. Neurologic: Facial musculature symmetric. Psychiatric: Patient acts appropriately throughout our interaction. Lymphatic: No cervical or submandibular lymphadenopathy Musculoskeletal: Gait intact. No edema, tenderness   LABS: Results for orders placed or performed in visit on 01/30/15  POCT UA - Microscopic Only  Result Value Ref Range   WBC, Ur, HPF, POC neg    RBC, urine, microscopic 11-12    Bacteria, U Microscopic trace    Mucus, UA neg    Epithelial cells, urine per micros 0-1    Crystals, Ur, HPF, POC neg    Casts, Ur, LPF, POC neg    Yeast, UA neg   POCT urinalysis dipstick  Result Value Ref Range   Color, UA yellow    Clarity, UA clear    Glucose, UA neg    Bilirubin, UA mod    Ketones, UA trace    Spec Grav, UA 1.020    Blood, UA mod    pH, UA 6.0    Protein, UA neg    Urobilinogen, UA 2.0    Nitrite, UA neg    Leukocytes, UA Negative Negative     EKG/XRAY:   Primary read interpreted by Dr. Marin Comment at [facility].   ASSESSMENT/PLAN: Encounter Diagnoses  Name Primary?  . Dysuria Yes  . Acute maxillary sinusitis, recurrence not specified    Advise to push fluids, water if possible Augmentin-if no improvement with drooling and facial pain then wil need referral to ENT Magic mouthwash with lidocaine Fu prn   Gross sideeffects, risk and benefits, and alternatives of  medications d/w patient. Patient is aware that all medications have potential sideeffects and we are unable to predict every sideeffect or drug-drug interaction that may occur.  Josmar Messimer DO  01/30/2015 3:14 PM

## 2015-02-08 ENCOUNTER — Encounter: Payer: BLUE CROSS/BLUE SHIELD | Admitting: Registered Nurse

## 2015-02-13 ENCOUNTER — Encounter: Payer: BLUE CROSS/BLUE SHIELD | Admitting: Registered Nurse

## 2015-02-14 ENCOUNTER — Encounter: Payer: BLUE CROSS/BLUE SHIELD | Attending: Physical Medicine & Rehabilitation | Admitting: Registered Nurse

## 2015-02-14 ENCOUNTER — Encounter: Payer: Self-pay | Admitting: Registered Nurse

## 2015-02-14 ENCOUNTER — Other Ambulatory Visit: Payer: Self-pay | Admitting: Registered Nurse

## 2015-02-14 VITALS — BP 134/91 | HR 75 | Resp 16

## 2015-02-14 DIAGNOSIS — R5381 Other malaise: Secondary | ICD-10-CM | POA: Insufficient documentation

## 2015-02-14 DIAGNOSIS — Z79899 Other long term (current) drug therapy: Secondary | ICD-10-CM

## 2015-02-14 DIAGNOSIS — Z5181 Encounter for therapeutic drug level monitoring: Secondary | ICD-10-CM

## 2015-02-14 DIAGNOSIS — F1120 Opioid dependence, uncomplicated: Secondary | ICD-10-CM

## 2015-02-14 DIAGNOSIS — G894 Chronic pain syndrome: Secondary | ICD-10-CM | POA: Diagnosis not present

## 2015-02-14 DIAGNOSIS — M47817 Spondylosis without myelopathy or radiculopathy, lumbosacral region: Secondary | ICD-10-CM

## 2015-02-14 DIAGNOSIS — F1920 Other psychoactive substance dependence, uncomplicated: Secondary | ICD-10-CM | POA: Diagnosis not present

## 2015-02-14 MED ORDER — MORPHINE SULFATE ER 100 MG PO TBCR: 100.0000 mg | EXTENDED_RELEASE_TABLET | Freq: Three times a day (TID) | ORAL | Status: DC

## 2015-02-14 MED ORDER — MORPHINE SULFATE 30 MG PO TABS: 30.0000 mg | ORAL_TABLET | Freq: Four times a day (QID) | ORAL | Status: DC | PRN

## 2015-02-14 NOTE — Progress Notes (Signed)
Subjective:    Patient ID: [name], male    DOB: 01/03/1961, 54 y.o.   MRN: [id number] HPI: [name] is a 54 year old male who returns for follow up for chronic pain and medication refill. [name] says his pain is located in his lower back and lower extremities. [name] rates his pain 8. His current exercise regime is walking with his walker. Also states [name] hasn't followed his usual exercises regime his mother passed away 10 days ago.  Emotional support given. [name] states [name] has been using a protein supplement since that time [name] has noticed there are times when [name] becomes diaphoretic. [name] was advised to follow up with his PCP [name] verbalizes understanding. At this time [name] is not diaphoretic.  Pain Inventory Average Pain 8 Pain Right Now 8 My pain is sharp, burning, stabbing, tingling and hamstrings very sore  In the last 24 hours, has pain interfered with the following? General activity 7 Relation with others 7 Enjoyment of life 7 What TIME of day is your pain at its worst? morning and evening Sleep (in general) Poor  Pain is worse with: walking, bending, standing and some activites Pain improves with: rest and medication Relief from Meds: 4  Mobility walk with assistance use a walker ability to climb steps?  yes do you drive?  yes  Function employed # of hrs/week 20  Neuro/Psych numbness tingling trouble walking spasms anxiety  Prior Studies Any changes since last visit?  no  Physicians involved in your care Any changes since last visit?  no   Family History  Problem Relation Age of Onset  . Heart disease Father 69    Died of MI   History   Social History  . Marital Status: Married    Spouse Name: N/A  . Number of Children: N/A  . Years of Education: N/A   Social History Main Topics  . Smoking status: Current Some Day Smoker -- 1.50 packs/day for 29 years    Types: Cigarettes, E-cigarettes  . Smokeless tobacco: Never Used     Comment: PT STOPPED  SMOKING CIGS 03/27/13. NOW CURRENTLY USING E-CIG and smoking a few cigerettes per week.  . Alcohol Use: No  . Drug Use: Yes    Special: Marijuana     Comment: Urine showed THC  . Sexual Activity: Not on file   Other Topics Concern  . None   Social History Narrative   Past Surgical History  Procedure Laterality Date  . Hernia repair    . Asd repair, sinus venosus    . Testicle surgery      testicular cancer surgery  . Cardiac defibrillator placement  08/2010  . Left heart catheterization with coronary angiogram N/A 06/14/2014    Procedure: LEFT HEART CATHETERIZATION WITH CORONARY ANGIOGRAM;  Surgeon: Jolaine Artist, MD;  Location: [facility] CATH LAB;  Service: Cardiovascular;  Laterality: N/A;   Past Medical History  Diagnosis Date  . Chronic systolic heart failure     EF 20-25%. s/p ST. Jude ICD  . CAD (coronary artery disease)     Last cath 2/12. 3-v CAD. Failed PCI of distal RCAc  CATH [facility] 4/13 with DES to  LAD X2  . Chronic back pain     lumbar stenosis  . Benign prostatic hypertrophy   . Depression   . History of testicular cancer   . Narcotic dependence     chronic  . Benzodiazepine dependence     chronic  .  Anxiety   . CHF (congestive heart failure)   . DJD (degenerative joint disease)   . Automatic implantable cardiac defibrillator [employer]     Analyze ST   BP 134/91 mmHg  Pulse 75  Resp 16  SpO2 96%  Opioid Risk Score:   Fall Risk Score:  `1  Depression screen PHQ 2/9  Depression screen [facility] 2/9 02/14/2015 01/30/2015 12/04/2014 10/18/2014 09/20/2014 08/13/2014  Decreased Interest 1 0 0 1 1 0  Down, Depressed, Hopeless 0 0 0 0 0 -  PHQ - 2 Score 1 0 0 1 1 0  Altered sleeping - - - 0 0 -  Tired, decreased energy - - - 0 2 -  Change in appetite - - - 0 0 -  Feeling bad or failure about yourself  - - - 0 0 -  Trouble concentrating - - - 0 0 -  Moving slowly or fidgety/restless - - - 0 0 -  Suicidal thoughts - - - 0 0 -  PHQ-9 Score - - - 1 3 -     Review of  Systems  Constitutional: Positive for diaphoresis.       Complains of drifting off to sleep during day and waking up drenched in sweat  Musculoskeletal: Positive for gait problem.       Spasms and hamstrings are sore when [name] gets out of bed  Neurological: Positive for numbness.       Tingling  Psychiatric/Behavioral: The patient is nervous/anxious.   All other systems reviewed and are negative.      Objective:   Physical Exam  Constitutional: [name] is oriented to person, place, and time. [name] appears well-developed and well-nourished.  HENT:  Head: Normocephalic and atraumatic.  Neck: Normal range of motion. Neck supple.  Cardiovascular: Normal rate and regular rhythm.   Pulmonary/Chest: Effort normal and breath sounds normal.  Musculoskeletal:  Normal Muscle Bulk and Muscle Testing Reveals: Upper Extremities: Full ROM and Muscle Strength 5/5 Thoracic Paraspinal Tenderness: T-11- T-12 Lumbar Paraspinal Tenderness:L-4- L-5 Lower Extremities: Full ROM and Muscle Strength 5/5 Arises from chair with ease Using walker for support  Neurological: [name] is alert and oriented to person, place, and time.  Skin: Skin is warm and dry.  Psychiatric: [name] has a normal mood and affect.  Nursing note and vitals reviewed.         Assessment & Plan:  1. Lumbar spinal stenosis, severe.  Refilled: MS Contin 100 mg one tablet three times a day #90 and MSIR 30 mg one tablet every 6 hours as needed for sever pain #120.  2. History of shoulder pain left side. Controlled. No complaints today.  3. Sleep apnea : compliant with CPAP  4. Constipation: Continue Linzess  20 minutes of face to face patient care time was spent during this visit. All questions were encouraged and answered.   F/U in 1 month

## 2015-02-15 LAB — PMP ALCOHOL METABOLITE (ETG): Ethyl Glucuronide (EtG): NEGATIVE ng/mL

## 2015-02-19 LAB — OPIATES/OPIOIDS (LC/MS-MS)
Codeine Urine: NEGATIVE ng/mL (ref <50)
Hydrocodone: NEGATIVE ng/mL (ref <50)
Hydromorphone: NEGATIVE ng/mL — AB (ref <50)
Morphine Urine: >50000 ng/mL (ref <50)
Norhydrocodone, Ur: NEGATIVE ng/mL (ref <50)
Noroxycodone, Ur: NEGATIVE ng/mL (ref <50)
Oxycodone, ur: NEGATIVE ng/mL (ref <50)
Oxymorphone: NEGATIVE ng/mL (ref <50)

## 2015-02-19 LAB — BENZODIAZEPINES (GC/LC/MS), URINE
Alprazolam metabolite (GC/LC/MS), ur confirm: NEGATIVE ng/mL (ref <25)
Clonazepam metabolite (GC/LC/MS), ur confirm: NEGATIVE ng/mL (ref <25)
Flurazepam metabolite (GC/LC/MS), ur confirm: NEGATIVE ng/mL (ref <50)
Lorazepam (GC/LC/MS), ur confirm: 2839 ng/mL (ref <50)
Midazolam (GC/LC/MS), ur confirm: NEGATIVE ng/mL (ref <50)
Nordiazepam (GC/LC/MS), ur confirm: NEGATIVE ng/mL (ref <50)
Oxazepam (GC/LC/MS), ur confirm: NEGATIVE ng/mL (ref <50)
Temazepam (GC/LC/MS), ur confirm: NEGATIVE ng/mL (ref <50)
Triazolam metabolite (GC/LC/MS), ur confirm: NEGATIVE ng/mL (ref <50)

## 2015-02-20 LAB — PRESCRIPTION MONITORING PROFILE (SOLSTAS)
Amphetamine/Meth: NEGATIVE ng/mL
Barbiturate Screen, Urine: NEGATIVE ng/mL
Buprenorphine, Urine: NEGATIVE ng/mL
Cannabinoid Scrn, Ur: NEGATIVE ng/mL
Carisoprodol, Urine: NEGATIVE ng/mL
Cocaine Metabolites: NEGATIVE ng/mL
Creatinine, Urine: 142.48 mg/dL (ref >20.0)
Fentanyl, Ur: NEGATIVE ng/mL
MDMA URINE: NEGATIVE ng/mL
Meperidine, Ur: NEGATIVE ng/mL
Methadone Screen, Urine: NEGATIVE ng/mL
Nitrites, Initial: NEGATIVE ug/mL
Oxycodone Screen, Ur: NEGATIVE ng/mL
Propoxyphene: NEGATIVE ng/mL
Tapentadol, urine: NEGATIVE ng/mL
Tramadol Scrn, Ur: NEGATIVE ng/mL
Zolpidem, Urine: NEGATIVE ng/mL
pH, Initial: 6.2 pH (ref 4.5–8.9)

## 2015-03-13 ENCOUNTER — Encounter (HOSPITAL_BASED_OUTPATIENT_CLINIC_OR_DEPARTMENT_OTHER): Payer: BLUE CROSS/BLUE SHIELD | Admitting: Registered Nurse

## 2015-03-13 ENCOUNTER — Encounter: Payer: Self-pay | Admitting: Registered Nurse

## 2015-03-13 VITALS — BP 133/84 | HR 82

## 2015-03-13 DIAGNOSIS — G894 Chronic pain syndrome: Secondary | ICD-10-CM

## 2015-03-13 DIAGNOSIS — F1920 Other psychoactive substance dependence, uncomplicated: Secondary | ICD-10-CM

## 2015-03-13 DIAGNOSIS — Z5181 Encounter for therapeutic drug level monitoring: Secondary | ICD-10-CM

## 2015-03-13 DIAGNOSIS — Z79899 Other long term (current) drug therapy: Secondary | ICD-10-CM

## 2015-03-13 DIAGNOSIS — M47817 Spondylosis without myelopathy or radiculopathy, lumbosacral region: Secondary | ICD-10-CM | POA: Diagnosis not present

## 2015-03-13 DIAGNOSIS — F1120 Opioid dependence, uncomplicated: Secondary | ICD-10-CM

## 2015-03-13 DIAGNOSIS — R5381 Other malaise: Secondary | ICD-10-CM | POA: Diagnosis not present

## 2015-03-13 MED ORDER — MORPHINE SULFATE ER 100 MG PO TBCR: 100.0000 mg | EXTENDED_RELEASE_TABLET | Freq: Three times a day (TID) | ORAL | Status: DC

## 2015-03-13 MED ORDER — MORPHINE SULFATE 30 MG PO TABS: 30.0000 mg | ORAL_TABLET | Freq: Four times a day (QID) | ORAL | Status: DC | PRN

## 2015-03-13 NOTE — Progress Notes (Signed)
Subjective:    Patient ID: [name], male    DOB: 10-19-1960, 54 y.o.   MRN: [id number]  HPI: [name] is a 54 year old male who returns for follow up for chronic pain and medication refill. He says his pain is located in his mid-lower back radiating  posteriorly into lower extremities. He rates his pain 6. His current exercise regime is working with a Physiological scientist 5-6 days a week.  Pain Inventory Average Pain 8 Pain Right Now 6 My pain is constant, sharp, burning, dull, tingling and parasthesia in legs  In the last 24 hours, has pain interfered with the following? General activity 3 Relation with others 4 Enjoyment of life 2 What TIME of day is your pain at its worst? morning and daytime and evening Sleep (in general) Fair  Pain is worse with: walking, bending, standing and some activites Pain improves with: rest, heat/ice and therapy/exercise Relief from Meds: not answered  Mobility use a walker how many minutes can you walk? 5-10 ability to climb steps?  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  no  Physicians involved in your care Any changes since last visit?  no   Family History  Problem Relation Age of Onset  . Heart disease Father 2    Died of MI   Social History   Social History  . Marital Status: Married    Spouse Name: N/A  . Number of Children: N/A  . Years of Education: N/A   Social History Main Topics  . Smoking status: Current Some Day Smoker -- 1.50 packs/day for 29 years    Types: Cigarettes, E-cigarettes  . Smokeless tobacco: Never Used     Comment: PT STOPPED SMOKING CIGS 03/27/13. NOW CURRENTLY USING E-CIG and smoking a few cigerettes  per week.  . Alcohol Use: No  . Drug Use: Yes    Special: Marijuana     Comment: Urine showed THC  . Sexual Activity: Not Asked   Other Topics Concern  . None   Social History Narrative   Past Surgical History  Procedure Laterality Date  . Hernia repair    . Asd repair, sinus venosus    . Testicle surgery      testicular cancer surgery  . Cardiac defibrillator placement  08/2010  . Left heart catheterization with coronary angiogram N/A 06/14/2014    Procedure: LEFT HEART CATHETERIZATION WITH CORONARY ANGIOGRAM;  Surgeon: Jolaine Artist, MD;  Location: [facility] CATH LAB;  Service: Cardiovascular;  Laterality: N/A;   Past Medical History  Diagnosis Date  . Chronic systolic heart failure     EF 20-25%. s/p ST. Jude ICD  . CAD (coronary artery disease)     Last cath 2/12. 3-v CAD. Failed PCI of distal RCAc  CATH [facility] 4/13 with DES to  LAD X2  . Chronic back pain     lumbar stenosis  . Benign prostatic hypertrophy   . Depression   . History of testicular cancer   . Narcotic dependence     chronic  . Benzodiazepine dependence     chronic  . Anxiety   . CHF (congestive heart failure)   . DJD (degenerative joint disease)   . Automatic implantable cardiac  defibrillator [employer]     Analyze ST   BP 133/84 mmHg  Pulse 82  SpO2 96%  Opioid Risk Score:   Fall Risk Score:  `1  Depression screen PHQ 2/9  Depression screen [facility] 2/9 03/13/2015 02/14/2015 01/30/2015 12/04/2014 10/18/2014 09/20/2014 08/13/2014  Decreased Interest 1 1 0 0 1 1 0  Down, Depressed, Hopeless 0 0 0 0 0 0 -  PHQ - 2 Score 1 1 0 0 1 1 0  Altered sleeping - - - - 0 0 -  Tired, decreased energy - - - - 0 2 -  Change in appetite - - - - 0 0 -  Feeling bad or failure about yourself  - - - - 0 0 -  Trouble concentrating - - - - 0 0 -  Moving slowly or fidgety/restless - - - - 0 0 -  Suicidal thoughts - - - - 0 0 -  PHQ-9 Score - - - - 1 3 -    Review of Systems  Constitutional: Positive for diaphoresis and  unexpected weight change.  Respiratory: Positive for apnea.        12 hrs use of o2  Gastrointestinal: Positive for nausea.  Musculoskeletal: Positive for gait problem.       Spasms  Skin: Positive for rash.  Hematological: Bruises/bleeds easily.  Psychiatric/Behavioral: The patient is nervous/anxious.   All other systems reviewed and are negative.      Objective:   Physical Exam  Constitutional: He is oriented to person, place, and time. He appears well-developed and well-nourished.  HENT:  Head: Normocephalic and atraumatic.  Neck: Normal range of motion. Neck supple.  Cardiovascular: Normal rate and regular rhythm.   Pulmonary/Chest: Effort normal and breath sounds normal.  Musculoskeletal:  Normal Muscle Bulk and Muscle Testing Reveals: Upper Extremities: Full ROM and Muscle Strength 5/5 Back without spinal or paraspinal tenderness Lower Extremities: Full ROM and Muscle Strength 5/5 Arises from chair with ease/ Using cadillac walker for support Narrow Based gait  Neurological: He is alert and oriented to person, place, and time.  Skin: Skin is warm and dry.  Psychiatric: He has a normal mood and affect.  Nursing note and vitals reviewed.         Assessment & Plan:  1. Lumbar spinal stenosis, severe.  Refilled: MS Contin 100 mg one tablet three times a day #90 and MSIR 30 mg one tablet every 6 hours as needed for sever pain #120.  2. History of shoulder pain left side. Controlled. No complaints today.  3. Sleep apnea : compliant with CPAP  4. Constipation: Continue Linzess  20 minutes of face to face patient care time was spent during this visit. All questions were encouraged and answered.   F/U in 1 month

## 2015-03-14 ENCOUNTER — Encounter: Payer: Self-pay | Admitting: *Deleted

## 2015-03-14 ENCOUNTER — Ambulatory Visit (INDEPENDENT_AMBULATORY_CARE_PROVIDER_SITE_OTHER): Payer: BLUE CROSS/BLUE SHIELD | Admitting: *Deleted

## 2015-03-14 DIAGNOSIS — I5042 Chronic combined systolic (congestive) and diastolic (congestive) heart failure: Secondary | ICD-10-CM

## 2015-03-14 DIAGNOSIS — Z006 Encounter for examination for normal comparison and control in clinical research program: Secondary | ICD-10-CM

## 2015-03-14 LAB — CUP PACEART INCLINIC DEVICE CHECK
Battery Remaining Longevity: 63.6 mo
Brady Statistic RA Percent Paced: 0.18 %
Brady Statistic RV Percent Paced: 0.17 %
Date Time Interrogation Session: 20160901165309
HighPow Impedance: 73.125
Lead Channel Impedance Value: 325 Ohm
Lead Channel Impedance Value: 450 Ohm
Lead Channel Pacing Threshold Amplitude: 0.5 V
Lead Channel Pacing Threshold Amplitude: 0.5 V
Lead Channel Pacing Threshold Amplitude: 2 V
Lead Channel Pacing Threshold Amplitude: 2 V
Lead Channel Pacing Threshold Pulse Width: 0.5 ms
Lead Channel Pacing Threshold Pulse Width: 0.5 ms
Lead Channel Pacing Threshold Pulse Width: 0.5 ms
Lead Channel Pacing Threshold Pulse Width: 0.5 ms
Lead Channel Sensing Intrinsic Amplitude: 12 mV
Lead Channel Sensing Intrinsic Amplitude: 3.1 mV
Lead Channel Setting Pacing Amplitude: 2 V
Lead Channel Setting Pacing Amplitude: 3 V
Lead Channel Setting Pacing Pulse Width: 0.5 ms
Lead Channel Setting Sensing Sensitivity: 0.5 mV
Pulse Gen Serial Number: 815096
Zone Setting Detection Interval: 270 ms
Zone Setting Detection Interval: 340 ms
Zone Setting Detection Interval: 400 ms

## 2015-03-14 NOTE — Progress Notes (Signed)
Urine drug screen for this encounter is consistent for prescribed medication  Ambien reported 02/13/15 and test date 02/14/15 and is absent. We are not prescribing this medication

## 2015-03-14 NOTE — Progress Notes (Signed)
ICD check in clinic with industry and research to close [place] study. Normal device function. Thresholds and sensing consistent with previous device measurements. Impedance trends stable over time. No evidence of any ventricular arrhythmias. No mode switches. Histogram distribution appropriate for patient and level of activity.Device programmed at appropriate safety margins. Device programmed to optimize intrinsic conduction. Estimated longevity 4.1-5.3 years. Pt enrolled in remote follow-up. Merlin 12/1/1/6, ROV with SK in February.

## 2015-03-14 NOTE — Progress Notes (Signed)
Expand All Collapse All   Analyze ST Final Research Visit- Pt seen in device clinic. Device checked by industry and research. No ST alerts. Research ST thresholds turned off per protocol. Pt released from research study [place] and transferred to [facility] for ongoing remote monitoring. EKG WNL reviewed by Dr. Caryl Comes. BP 136/84 HR 68.

## 2015-04-02 ENCOUNTER — Telehealth: Payer: Self-pay | Admitting: *Deleted

## 2015-04-02 NOTE — Telephone Encounter (Signed)
lmtcb  (Need to inform patient to obtain referral to local oncologist from his PCP.  Also need to inform him working on cardiac rehab referral)

## 2015-04-03 ENCOUNTER — Telehealth: Payer: Self-pay | Admitting: Pulmonary Disease

## 2015-04-03 NOTE — Telephone Encounter (Signed)
Follow up     Returning [name]'s call.  He states he really need to talk to you today.

## 2015-04-03 NOTE — Telephone Encounter (Signed)
Called by [name] d/t fever to 102.0 F and increase in cough productive of yellow-green sputum. He is followed in clinic by Dr. Joya Gaskins. Denies increased wheezing or SOB. Will call in prescription for Augmentin 875 mg PO Q 12 hour, #20, Sig: 1 tab PO Q 12 hours for 10 days. Patient instructed to call office in AM for f/u appointment. Patient also instructed to go to ED should he become more SOB or have increased wheezing.

## 2015-04-04 ENCOUNTER — Encounter: Payer: Self-pay | Admitting: Internal Medicine

## 2015-04-04 NOTE — Telephone Encounter (Signed)
He is a high risk patient  Probably needs an OV an CXR

## 2015-04-04 NOTE — Telephone Encounter (Signed)
lmtcb X1 for pt. Will hold in triage as message has been closed

## 2015-04-05 NOTE — Telephone Encounter (Signed)
Spoke with pt, states his fever has broken and his congestion is much improved d/t being on the ABX.  Pt prefers to wait and establish with a new provider instead of seeing next available. I advised that PW and Dr. Oletta Darter are recommending he come in but he feels that he does not need an acute ov.   Pt is unsure of who he wishes to switch to with [facility] and PW leaving.  States he will call our office back when he knows who he wants to switch care to.  Will await call.

## 2015-04-10 ENCOUNTER — Encounter: Payer: BLUE CROSS/BLUE SHIELD | Attending: Physical Medicine & Rehabilitation | Admitting: Registered Nurse

## 2015-04-10 ENCOUNTER — Encounter: Payer: Self-pay | Admitting: Registered Nurse

## 2015-04-10 VITALS — BP 153/95 | HR 76 | Resp 14

## 2015-04-10 DIAGNOSIS — G894 Chronic pain syndrome: Secondary | ICD-10-CM | POA: Diagnosis not present

## 2015-04-10 DIAGNOSIS — M47817 Spondylosis without myelopathy or radiculopathy, lumbosacral region: Secondary | ICD-10-CM

## 2015-04-10 DIAGNOSIS — R5381 Other malaise: Secondary | ICD-10-CM | POA: Insufficient documentation

## 2015-04-10 DIAGNOSIS — F1920 Other psychoactive substance dependence, uncomplicated: Secondary | ICD-10-CM

## 2015-04-10 DIAGNOSIS — F1120 Opioid dependence, uncomplicated: Secondary | ICD-10-CM

## 2015-04-10 DIAGNOSIS — Z5181 Encounter for therapeutic drug level monitoring: Secondary | ICD-10-CM | POA: Diagnosis not present

## 2015-04-10 DIAGNOSIS — Z79899 Other long term (current) drug therapy: Secondary | ICD-10-CM

## 2015-04-10 MED ORDER — MORPHINE SULFATE ER 100 MG PO TBCR
100.0000 mg | EXTENDED_RELEASE_TABLET | Freq: Three times a day (TID) | ORAL | Status: DC
Start: 2015-04-10 — End: 2015-05-10

## 2015-04-10 MED ORDER — MORPHINE SULFATE 30 MG PO TABS: 30.0000 mg | ORAL_TABLET | Freq: Four times a day (QID) | ORAL | Status: DC | PRN

## 2015-04-10 NOTE — Progress Notes (Signed)
Subjective:    Patient ID: [name], male    DOB: October 20, 1960, 54 y.o.   MRN: [id number]  HPI: [name] is a 54 year old male who returns for follow up for chronic pain and medication refill. He says his pain is located in his mid-lower back radiating posteriorly into lower extremities. He rates his pain 8. His current exercise regime is using his stationary bicycle and walking 12 minutes a day.  Pain Inventory Average Pain 7 Pain Right Now 8 My pain is sharp, burning, stabbing and aching  In the last 24 hours, has pain interfered with the following? General activity 3 Relation with others 1 Enjoyment of life 5 What TIME of day is your pain at its worst? morning, daytime evening Sleep (in general) Poor  Pain is worse with: walking, bending, standing and some activites Pain improves with: rest, pacing activities and medication Relief from Meds: 7  Mobility walk with assistance use a walker how many minutes can you walk? 10-12 ability to climb steps?  yes do you drive?  yes Do you have any goals in this area?  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  yes  Neuro/Psych weakness numbness trouble walking spasms anxiety  Prior Studies Any changes since last visit?  no  Physicians involved in your care Any changes since last visit?  no   Family History  Problem Relation Age of Onset  . Heart disease Father 85    Died of MI   Social History   Social History  . Marital Status: Married    Spouse Name: N/A  . Number of Children: N/A  . Years of Education: N/A   Social History Main Topics  . Smoking status: Current Some Day Smoker -- 1.50 packs/day for 29 years    Types: Cigarettes, E-cigarettes  . Smokeless tobacco: Never Used     Comment: PT STOPPED SMOKING CIGS 03/27/13. NOW CURRENTLY USING E-CIG and smoking a few cigerettes per week.  . Alcohol Use: No  . Drug Use: Yes   Special: Marijuana     Comment: Urine showed THC  . Sexual Activity: Not Asked   Other Topics Concern  . None   Social History Narrative   Past Surgical History  Procedure Laterality Date  . Hernia repair    . Asd repair, sinus venosus    . Testicle surgery      testicular cancer surgery  . Cardiac defibrillator placement  08/2010  . Left heart catheterization with coronary angiogram N/A 06/14/2014    Procedure: LEFT HEART CATHETERIZATION WITH CORONARY ANGIOGRAM;  Surgeon: Jolaine Artist, MD;  Location: [facility] CATH LAB;  Service: Cardiovascular;  Laterality: N/A;   Past Medical History  Diagnosis Date  . Chronic systolic heart failure     EF 20-25%. s/p ST. Jude ICD  . CAD (coronary artery disease)     Last cath 2/12. 3-v CAD. Failed PCI of distal RCAc  CATH [facility] 4/13 with DES to  LAD X2  . Chronic back pain     lumbar stenosis  . Benign prostatic hypertrophy   . Depression   . History of testicular cancer   . Narcotic dependence     chronic  . Benzodiazepine dependence     chronic  . Anxiety   . CHF (congestive heart failure)   . DJD (degenerative joint disease)   . Automatic implantable cardiac  defibrillator [employer]     Analyze ST   BP 153/95 mmHg  Pulse 76  Resp 14  SpO2 96%  Opioid Risk Score:   Fall Risk Score:  `1  Depression screen PHQ 2/9  Depression screen [facility] 2/9 03/13/2015 02/14/2015 01/30/2015 12/04/2014 10/18/2014 09/20/2014 08/13/2014  Decreased Interest 1 1 0 0 1 1 0  Down, Depressed, Hopeless 0 0 0 0 0 0 -  PHQ - 2 Score 1 1 0 0 1 1 0  Altered sleeping - - - - 0 0 -  Tired, decreased energy - - - - 0 2 -  Change in appetite - - - - 0 0 -  Feeling bad or failure about yourself  - - - - 0 0 -  Trouble concentrating - - - - 0 0 -  Moving slowly or fidgety/restless - - - - 0 0 -  Suicidal thoughts - - - - 0 0 -  PHQ-9 Score - - - - 1 3 -     Review of Systems  Constitutional: Positive for diaphoresis.  Respiratory: Positive for apnea and cough.     Cardiovascular: Positive for leg swelling.  Musculoskeletal: Positive for gait problem.  Neurological: Positive for weakness and numbness.       Spasms   Hematological: Bruises/bleeds easily.  Psychiatric/Behavioral: The patient is nervous/anxious.   All other systems reviewed and are negative.      Objective:   Physical Exam  Constitutional: He is oriented to person, place, and time. He appears well-developed and well-nourished.  HENT:  Head: Normocephalic and atraumatic.  Neck: Normal range of motion. Neck supple.  Cardiovascular: Normal rate and regular rhythm.   Pulmonary/Chest: Effort normal and breath sounds normal.  Musculoskeletal:  Normal Muscle Bulk and Muscle Testing Reveals: Upper Extremities: Full ROM and Muscle Strength 5/5 Lumbar Paraspinal Tenderness: L-4- L-5 Lower Extremities: Full ROM and Muscle Strength 5/5 Arises from chair slowly Using cadillac walker for support  Neurological: He is alert and oriented to person, place, and time.  Skin: Skin is warm and dry.  Psychiatric: He has a normal mood and affect.  Nursing note and vitals reviewed.         Assessment & Plan:  1. Lumbar spinal stenosis, severe.  Refilled: MS Contin 100 mg one tablet three times a day #90 and MSIR 30 mg one tablet every 6 hours as needed for sever pain #120.  2. History of shoulder pain left side. Controlled. No complaints today.  3. Sleep apnea : compliant with CPAP  4. Constipation: Continue Linzess  20 minutes of face to face patient care time was spent during this visit. All questions were encouraged and answered.   F/U in 1 month

## 2015-04-12 NOTE — Telephone Encounter (Signed)
Patient returns my call.  Informed that he should contact PCP for requesting referral.   Explained that cardiac rehab referral has been completed and faxed. He thanks me several times for helping with this and is agreeable to above stated plans.

## 2015-04-20 ENCOUNTER — Encounter: Payer: Self-pay | Admitting: Internal Medicine

## 2015-05-02 ENCOUNTER — Inpatient Hospital Stay (HOSPITAL_COMMUNITY): Admission: RE | Admit: 2015-05-02 | Payer: BLUE CROSS/BLUE SHIELD | Source: Ambulatory Visit

## 2015-05-06 ENCOUNTER — Ambulatory Visit (HOSPITAL_COMMUNITY): Payer: BLUE CROSS/BLUE SHIELD

## 2015-05-08 ENCOUNTER — Ambulatory Visit (HOSPITAL_COMMUNITY): Payer: BLUE CROSS/BLUE SHIELD

## 2015-05-10 ENCOUNTER — Telehealth: Payer: Self-pay | Admitting: *Deleted

## 2015-05-10 ENCOUNTER — Encounter: Payer: BLUE CROSS/BLUE SHIELD | Attending: Physical Medicine & Rehabilitation | Admitting: Registered Nurse

## 2015-05-10 ENCOUNTER — Encounter: Payer: Self-pay | Admitting: Registered Nurse

## 2015-05-10 ENCOUNTER — Ambulatory Visit (HOSPITAL_COMMUNITY): Payer: BLUE CROSS/BLUE SHIELD

## 2015-05-10 VITALS — BP 135/78 | HR 94

## 2015-05-10 DIAGNOSIS — F1120 Opioid dependence, uncomplicated: Secondary | ICD-10-CM | POA: Diagnosis not present

## 2015-05-10 DIAGNOSIS — G894 Chronic pain syndrome: Secondary | ICD-10-CM | POA: Diagnosis not present

## 2015-05-10 DIAGNOSIS — R5381 Other malaise: Secondary | ICD-10-CM | POA: Insufficient documentation

## 2015-05-10 DIAGNOSIS — M47817 Spondylosis without myelopathy or radiculopathy, lumbosacral region: Secondary | ICD-10-CM

## 2015-05-10 DIAGNOSIS — Z5181 Encounter for therapeutic drug level monitoring: Secondary | ICD-10-CM

## 2015-05-10 DIAGNOSIS — Z79899 Other long term (current) drug therapy: Secondary | ICD-10-CM

## 2015-05-10 MED ORDER — MORPHINE SULFATE ER 100 MG PO TBCR: 100.0000 mg | EXTENDED_RELEASE_TABLET | Freq: Three times a day (TID) | ORAL | Status: DC

## 2015-05-10 MED ORDER — MORPHINE SULFATE 30 MG PO TABS: 30.0000 mg | ORAL_TABLET | Freq: Four times a day (QID) | ORAL | Status: DC | PRN

## 2015-05-10 NOTE — Telephone Encounter (Signed)
Initiated PA for Qvar 43mg thru Cover My Meds. Key: [id number] for review. Will await response.

## 2015-05-13 ENCOUNTER — Ambulatory Visit (INDEPENDENT_AMBULATORY_CARE_PROVIDER_SITE_OTHER): Payer: BLUE CROSS/BLUE SHIELD | Admitting: Psychology

## 2015-05-13 ENCOUNTER — Ambulatory Visit (HOSPITAL_COMMUNITY): Payer: BLUE CROSS/BLUE SHIELD

## 2015-05-13 DIAGNOSIS — F4323 Adjustment disorder with mixed anxiety and depressed mood: Secondary | ICD-10-CM | POA: Diagnosis not present

## 2015-05-13 NOTE — Telephone Encounter (Signed)
PA still in process.  

## 2015-05-14 NOTE — Progress Notes (Signed)
Subjective:    Patient ID: [name], male    DOB: 1960-10-16, 54 y.o.   MRN: [id number]  HPI: [name] is a 54 year old male who returns for follow up for chronic pain and medication refill. He says his pain is located in his lower back radiating posteriorly and laterally into lower extremities. He rates his pain 7. His current exercise regime is using his stationary bicycle and walking 12 minutes a day. Also states he's having marital issues encouraged counseling he verbalizes understanding.  Pain Inventory Average Pain 7 Pain Right Now 7 My pain is sharp, burning and stabbing  In the last 24 hours, has pain interfered with the following? General activity 6 Relation with others 6 Enjoyment of life 6 What TIME of day is your pain at its worst? Morning and Evening Sleep (in general) NA  Pain is worse with: walking, bending and standing Pain improves with: rest and medication Relief from Meds: 6  Mobility walk with assistance use a walker how many minutes can you walk? 5-10 ability to climb steps?  no do you drive?  yes Do you have any goals in this area?  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  no  Neuro/Psych weakness trouble walking anxiety  Prior Studies Any changes since last visit?  no  Physicians involved in your care Any changes since last visit?  no   Family History  Problem Relation Age of Onset  . Heart disease Father 57    Died of MI   Social History   Social History  . Marital Status: Married    Spouse Name: N/A  . Number of Children: N/A  . Years of Education: N/A   Social History Main Topics  . Smoking status: Current Some Day Smoker -- 1.50 packs/day for 29 years    Types: Cigarettes, E-cigarettes  . Smokeless tobacco: Never Used     Comment: PT STOPPED SMOKING CIGS 03/27/13. NOW CURRENTLY USING E-CIG and smoking a few cigerettes per week.  . Alcohol Use:  No  . Drug Use: Yes    Special: Marijuana     Comment: Urine showed THC  . Sexual Activity: Not Asked   Other Topics Concern  . None   Social History Narrative   Past Surgical History  Procedure Laterality Date  . Hernia repair    . Asd repair, sinus venosus    . Testicle surgery      testicular cancer surgery  . Cardiac defibrillator placement  08/2010  . Left heart catheterization with coronary angiogram N/A 06/14/2014    Procedure: LEFT HEART CATHETERIZATION WITH CORONARY ANGIOGRAM;  Surgeon: Jolaine Artist, MD;  Location: [facility] CATH LAB;  Service: Cardiovascular;  Laterality: N/A;   Past Medical History  Diagnosis Date  . Chronic systolic heart failure (HCC)     EF 20-25%. s/p ST. Jude ICD  . CAD (coronary artery disease)     Last cath 2/12. 3-v CAD. Failed PCI of distal RCAc  CATH [facility] 4/13 with DES to  LAD X2  . Chronic back pain     lumbar stenosis  . Benign prostatic hypertrophy   . Depression   . History of testicular cancer   . Narcotic dependence (HCC)     chronic  . Benzodiazepine dependence (HCC)     chronic  . Anxiety   . CHF (congestive heart failure) ([place])   .  DJD (degenerative joint disease)   . Automatic implantable cardiac defibrillator St Judes     Analyze ST   BP 135/78 mmHg  Pulse 94  SpO2 97%  Opioid Risk Score:   Fall Risk Score:  `1  Depression screen PHQ 2/9  Depression screen [facility] 2/9 03/13/2015 02/14/2015 01/30/2015 12/04/2014 10/18/2014 09/20/2014 08/13/2014  Decreased Interest 1 1 0 0 1 1 0  Down, Depressed, Hopeless 0 0 0 0 0 0 -  PHQ - 2 Score 1 1 0 0 1 1 0  Altered sleeping - - - - 0 0 -  Tired, decreased energy - - - - 0 2 -  Change in appetite - - - - 0 0 -  Feeling bad or failure about yourself  - - - - 0 0 -  Trouble concentrating - - - - 0 0 -  Moving slowly or fidgety/restless - - - - 0 0 -  Suicidal thoughts - - - - 0 0 -  PHQ-9 Score - - - - 1 3 -     Review of Systems  Constitutional: Positive for appetite change and  unexpected weight change.  Respiratory:       Respiratory Infections  Gastrointestinal: Positive for nausea and constipation.  Neurological: Positive for weakness.       Gait Instability  Psychiatric/Behavioral: The patient is nervous/anxious.   All other systems reviewed and are negative.      Objective:   Physical Exam  Constitutional: He is oriented to person, place, and time. He appears well-developed and well-nourished.  HENT:  Head: Normocephalic and atraumatic.  Neck: Normal range of motion. Neck supple.  Cardiovascular: Normal rate and regular rhythm.   Pulmonary/Chest: Effort normal and breath sounds normal.  Musculoskeletal:  Normal Muscle Bulk and Muscle Testing Reveals: Upper Extremities: Full ROM and Muscle Strength 5/5 Lumbar Paraspinal Tenderness: L-1- L-3 Lower Extremities: Full ROM and Muscle Strength 5/5 Arises from chair slowly Narrow Based Gait   Neurological: He is alert and oriented to person, place, and time.  Skin: Skin is warm and dry.  Psychiatric: He has a normal mood and affect.  Nursing note and vitals reviewed.         Assessment & Plan:  1. Lumbar spinal stenosis, severe.  Refilled: MS Contin 100 mg one tablet three times a day #90 and MSIR 30 mg one tablet every 6 hours as needed for sever pain #120.  2. History of shoulder pain left side. Controlled. No complaints today.  3. Sleep apnea : compliant with CPAP  4. Constipation: Continue Linzess  20 minutes of face to face patient care time was spent during this visit. All questions were encouraged and answered.   F/U in 1 month

## 2015-05-14 NOTE — Telephone Encounter (Signed)
Qvar has been denied. insurance states pt must try Asamanex  And Flovent.

## 2015-05-15 ENCOUNTER — Ambulatory Visit (HOSPITAL_COMMUNITY): Payer: BLUE CROSS/BLUE SHIELD

## 2015-05-16 NOTE — Telephone Encounter (Signed)
Fine to change to Asmanex 2 puffs daily .  Make sure has follow up in few months  Please contact office for sooner follow up if symptoms do not improve or worsen or seek emergency care

## 2015-05-16 NOTE — Telephone Encounter (Signed)
Former PW pt for pulmonary and [facility] pt for sleep - Pt was last seen by Dr. Joya Gaskins on 10/08/14 with the following instructions:  Patient Instructions     No change in medications. You do not need oxygen daytime Stay on ASV/cpap oxygen at night Stay on Qvar twice daily Return 6 months   Pt does not have a pending appt with a provider.  Tammy, pt had OV with you in April 2015.  Can you please assist with this?  Thank you.

## 2015-05-16 NOTE — Telephone Encounter (Signed)
Left message for pt to call back to discuss med change

## 2015-05-17 ENCOUNTER — Ambulatory Visit (HOSPITAL_COMMUNITY): Payer: BLUE CROSS/BLUE SHIELD

## 2015-05-17 NOTE — Telephone Encounter (Signed)
LMTCB x2 for pt 

## 2015-05-20 ENCOUNTER — Ambulatory Visit (HOSPITAL_COMMUNITY): Payer: BLUE CROSS/BLUE SHIELD

## 2015-05-22 ENCOUNTER — Ambulatory Visit (HOSPITAL_COMMUNITY): Payer: BLUE CROSS/BLUE SHIELD

## 2015-05-24 ENCOUNTER — Ambulatory Visit (HOSPITAL_COMMUNITY): Payer: BLUE CROSS/BLUE SHIELD

## 2015-05-24 ENCOUNTER — Ambulatory Visit: Payer: BLUE CROSS/BLUE SHIELD | Admitting: Psychology

## 2015-05-24 NOTE — Telephone Encounter (Signed)
Left message for pt to call back  °

## 2015-05-27 ENCOUNTER — Ambulatory Visit (HOSPITAL_COMMUNITY): Payer: BLUE CROSS/BLUE SHIELD

## 2015-05-27 NOTE — Telephone Encounter (Signed)
LM for pt to call office back

## 2015-05-29 ENCOUNTER — Ambulatory Visit (HOSPITAL_COMMUNITY): Payer: BLUE CROSS/BLUE SHIELD

## 2015-05-31 ENCOUNTER — Ambulatory Visit (HOSPITAL_COMMUNITY): Payer: BLUE CROSS/BLUE SHIELD

## 2015-06-03 ENCOUNTER — Ambulatory Visit (HOSPITAL_COMMUNITY): Payer: BLUE CROSS/BLUE SHIELD

## 2015-06-05 ENCOUNTER — Ambulatory Visit (HOSPITAL_COMMUNITY): Payer: BLUE CROSS/BLUE SHIELD

## 2015-06-10 ENCOUNTER — Ambulatory Visit (HOSPITAL_COMMUNITY): Payer: BLUE CROSS/BLUE SHIELD

## 2015-06-10 ENCOUNTER — Ambulatory Visit (HOSPITAL_BASED_OUTPATIENT_CLINIC_OR_DEPARTMENT_OTHER): Payer: BLUE CROSS/BLUE SHIELD | Admitting: Physical Medicine & Rehabilitation

## 2015-06-10 ENCOUNTER — Encounter: Payer: Self-pay | Admitting: Physical Medicine & Rehabilitation

## 2015-06-10 ENCOUNTER — Encounter: Payer: BLUE CROSS/BLUE SHIELD | Attending: Physical Medicine & Rehabilitation

## 2015-06-10 ENCOUNTER — Ambulatory Visit: Payer: BLUE CROSS/BLUE SHIELD | Admitting: Physical Medicine & Rehabilitation

## 2015-06-10 VITALS — BP 130/76 | HR 94 | Resp 14

## 2015-06-10 DIAGNOSIS — R5381 Other malaise: Secondary | ICD-10-CM | POA: Insufficient documentation

## 2015-06-10 DIAGNOSIS — D751 Secondary polycythemia: Secondary | ICD-10-CM | POA: Diagnosis not present

## 2015-06-10 DIAGNOSIS — M4806 Spinal stenosis, lumbar region: Secondary | ICD-10-CM

## 2015-06-10 DIAGNOSIS — M48062 Spinal stenosis, lumbar region with neurogenic claudication: Secondary | ICD-10-CM

## 2015-06-10 MED ORDER — MORPHINE SULFATE ER 100 MG PO TBCR: 100.0000 mg | EXTENDED_RELEASE_TABLET | Freq: Three times a day (TID) | ORAL | Status: DC

## 2015-06-10 MED ORDER — MORPHINE SULFATE 30 MG PO TABS: 30.0000 mg | ORAL_TABLET | Freq: Four times a day (QID) | ORAL | Status: DC | PRN

## 2015-06-10 NOTE — Progress Notes (Signed)
Subjective:    Patient ID: [name], male    DOB: 1960-08-21, 54 y.o.   MRN: [id number]  HPI 54 year old male with severe lumbar spinal stenosis is been on chronic narcotic analgesics for several years. He hasn't history of testicular carcinoma and has had unilateral orchiectomy. He has noted increased pain and swelling of his remaining testicle.  He has an appointment with his urologist today. Reviewed notes from [facility] Dr. Stann Mainland, department of cardiology. Notes reveal coronary artery disease with angina. Patient reports that he has not been cleared to do cardiac rehabilitation but is doing recumbent bicycling at home about 30-45 minutes per day without symptoms.    Pain Inventory Average Pain 7 Pain Right Now 8 My pain is constant, sharp, burning, dull, stabbing, tingling and aching  In the last 24 hours, has pain interfered with the following? General activity 3 Relation with others 3 Enjoyment of life 3 What TIME of day is your pain at its worst? daytime, evening, night Sleep (in general) Fair  Pain is worse with: walking, bending, standing and some activites Pain improves with: medication Relief from Meds: 7  Mobility use a walker do you drive?  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  yes  Neuro/Psych numbness tingling trouble walking anxiety  Prior Studies Any changes since last visit?  no  Physicians involved in your care Any changes since last visit?  no   Family History  Problem Relation Age of Onset  . Heart disease Father 35    Died of MI   Social History   Social History  . Marital Status: Married    Spouse Name: N/A  . Number of Children: N/A  . Years of Education: N/A   Social History Main Topics  . Smoking status: Current Some Day Smoker -- 1.50 packs/day for 29 years    Types: Cigarettes, E-cigarettes  .  Smokeless tobacco: Never Used     Comment: PT STOPPED SMOKING CIGS 03/27/13. NOW CURRENTLY USING E-CIG and smoking a few cigerettes per week.  . Alcohol Use: No  . Drug Use: Yes    Special: Marijuana     Comment: Urine showed THC  . Sexual Activity: Not Asked   Other Topics Concern  . None   Social History Narrative   Past Surgical History  Procedure Laterality Date  . Hernia repair    . Asd repair, sinus venosus    . Testicle surgery      testicular cancer surgery  . Cardiac defibrillator placement  08/2010  . Left heart catheterization with coronary angiogram N/A 06/14/2014    Procedure: LEFT HEART CATHETERIZATION WITH CORONARY ANGIOGRAM;  Surgeon: Jolaine Artist, MD;  Location: [facility] CATH LAB;  Service: Cardiovascular;  Laterality: N/A;   Past Medical History  Diagnosis Date  . Chronic systolic heart failure (HCC)     EF 20-25%. s/p ST. Jude ICD  . CAD (coronary artery disease)     Last cath 2/12. 3-v CAD. Failed PCI of distal RCAc  CATH [facility] 4/13 with DES to  LAD X2  . Chronic back pain     lumbar stenosis  . Benign prostatic hypertrophy   . Depression   . History of testicular cancer   . Narcotic dependence (HCC)     chronic  . Benzodiazepine dependence (HCC)     chronic  . Anxiety   .  CHF (congestive heart failure) ([place])   . DJD (degenerative joint disease)   . Automatic implantable cardiac defibrillator St Judes     Analyze ST   BP 130/76 mmHg  Pulse 94  Resp 14  SpO2 98%  Opioid Risk Score:   Fall Risk Score:  `1  Depression screen PHQ 2/9  Depression screen [facility] 2/9 03/13/2015 02/14/2015 01/30/2015 12/04/2014 10/18/2014 09/20/2014 08/13/2014  Decreased Interest 1 1 0 0 1 1 0  Down, Depressed, Hopeless 0 0 0 0 0 0 -  PHQ - 2 Score 1 1 0 0 1 1 0  Altered sleeping - - - - 0 0 -  Tired, decreased energy - - - - 0 2 -  Change in appetite - - - - 0 0 -  Feeling bad or failure about yourself  - - - - 0 0 -  Trouble concentrating - - - - 0 0 -  Moving slowly or  fidgety/restless - - - - 0 0 -  Suicidal thoughts - - - - 0 0 -  PHQ-9 Score - - - - 1 3 -     Review of Systems  Musculoskeletal: Positive for gait problem.  Neurological: Positive for numbness.       Tingling  Psychiatric/Behavioral: The patient is nervous/anxious.   All other systems reviewed and are negative.      Objective:   Physical Exam  Constitutional: He is oriented to person, place, and time. He appears well-developed and well-nourished.  HENT:  Head: Normocephalic and atraumatic.  Eyes: Conjunctivae are normal. Pupils are equal, round, and reactive to light.  Neck: Normal range of motion.  Musculoskeletal:       Thoracic back: He exhibits decreased range of motion. He exhibits no deformity and no pain.       Lumbar back: He exhibits decreased range of motion. He exhibits no deformity and no pain.  Negative straight leg raising test  Neurological: He is alert and oriented to person, place, and time.  Motor strength is 5/5 bilateral hip flexor and knee extensor and ankle dorsiflexor and plantar flexor  Psychiatric: He has a normal mood and affect.  Nursing note and vitals reviewed.   Patient ambulates without an assistive device Mood and affect are appropriate      Assessment & Plan:   1. Lumbar spinal stenosis remains functionally independent with his mobility as well as self-care. He is no longer ambulating with his walker on a consistent basis. He has lost about 70 pounds. We discussed   cardiac rehabilitation as this would likely help with his chronic low back pain.He will consult with his cardiologist at [facility] next month  We'll continue high-dose narcotic analgesics. At this point he has been compliant with treatment . MS Contin CR 100 mg 3 times a day Morphine sulfate IR 30 mg 4 times a day  Continue opioid monitoring program. This consists of regular clinic visits, examinations, urine drug screen, pill counts as well as use of  [place] controlled substance reporting System.Last urine drug screen August 2016 was consistent  2. History of testicular cancer, patient with new mass, evaluation by urology. If the patient has surgery, anesthesiology and urology will manage postoperative pain at least for the first month

## 2015-06-11 ENCOUNTER — Ambulatory Visit: Payer: BLUE CROSS/BLUE SHIELD | Admitting: Family Medicine

## 2015-06-12 ENCOUNTER — Other Ambulatory Visit: Payer: Self-pay | Admitting: Family Medicine

## 2015-06-12 ENCOUNTER — Encounter: Payer: Self-pay | Admitting: Family Medicine

## 2015-06-12 ENCOUNTER — Ambulatory Visit (HOSPITAL_COMMUNITY): Payer: BLUE CROSS/BLUE SHIELD

## 2015-06-12 ENCOUNTER — Ambulatory Visit (INDEPENDENT_AMBULATORY_CARE_PROVIDER_SITE_OTHER): Payer: BLUE CROSS/BLUE SHIELD | Admitting: Family Medicine

## 2015-06-12 VITALS — BP 130/84 | HR 77 | Temp 98.1°F | Resp 16 | Ht 71.25 in | Wt 210.2 lb

## 2015-06-12 DIAGNOSIS — Z5189 Encounter for other specified aftercare: Secondary | ICD-10-CM | POA: Diagnosis not present

## 2015-06-12 DIAGNOSIS — H00016 Hordeolum externum left eye, unspecified eyelid: Secondary | ICD-10-CM | POA: Diagnosis not present

## 2015-06-12 MED ORDER — ERYTHROMYCIN 5 MG/GM OP OINT: 1.0000 "application " | TOPICAL_OINTMENT | Freq: Three times a day (TID) | OPHTHALMIC | Status: DC

## 2015-06-12 NOTE — Progress Notes (Signed)
[facility] [address], [place] [address] [phone]  Date:  06/12/2015   Name:  [name]   DOB:  March 23, 1961   MRN:  [id number]  PCP:  Lamar Blinks, MD    Chief Complaint: penis problems; knot right testical; and swollen top left eyelid   History of Present Illness:  [name] is a 54 y.o. very pleasant male patient who presents with the following:  Pt with multiple medical problems and history of chronic pain on large doses of medications for same. Here today to follow-up a issue with his testicle-  Today is Wednesday. This past Monday he went to his urologist because he was concerned about possible infection in his testicle. He only has one due to history of testicular cancer.  He reports that  On Monday they did not see anything on the [place].  However he continued to notice pain and was concerned- He went back yesterday and they "drained an infected area." he is here today because he needs the dressing changed.  It sounds like his wound is packed and he really does not think he wants to tackle this at home.    He also notes a likely stye on his left upper lid that has been there for about 3 days. No contacts worn, no vision change, no crusting of the eye in the am.    Patient Active Problem List   Diagnosis Date Noted  . Constipation 09/04/2014  . Unstable angina ([place]) 06/14/2014  . Therapeutic opioid induced constipation 04/02/2014  . Decreased body weight 11/08/2013  . Hypoxemia 09/25/2013  . Polycythemia 09/18/2013  . Erectile dysfunction 11/24/2012  . 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 MAI (mycobacterium avium-intracellulare) ([place]) 05/16/2012  . 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 Pre-operative cardiovascular examination 11/21/2011  . 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 PAD (peripheral artery disease) ([place]) 02/24/2011  . ICD-St.Jude 12/24/2010  . Depression 12/09/2010  . Tobacco  abuse 12/09/2010  . cHistory of testicular cancer 12/09/2010  . Benzodiazepine dependence ([place]) 12/09/2010  . Narcotic dependence ([place]) 12/09/2010  . Chronic back pain 12/05/2010  . Anxiety disorder 10/17/2010  . CAD, NATIVE VESSEL 09/15/2010  . COMBINED HEART FAILURE, CHRONIC 09/15/2010  . CARDIOMYOPATHY, ISCHEMIC 09/12/2010    Past Medical History  Diagnosis Date  . Chronic systolic heart failure (HCC)     EF 20-25%. s/p ST. Jude ICD  . CAD (coronary artery disease)     Last cath 2/12. 3-v CAD. Failed PCI of distal RCAc  CATH [facility] 4/13 with DES to  LAD X2  . Chronic back pain     lumbar stenosis  . Benign prostatic hypertrophy   . Depression   . History of testicular cancer   . Narcotic dependence (HCC)     chronic  . Benzodiazepine dependence (HCC)     chronic  . Anxiety   . CHF (congestive heart failure) ([place])   . DJD (degenerative joint disease)   . Automatic implantable cardiac defibrillator St Judes     Analyze ST    Past Surgical History  Procedure Laterality Date  . Hernia repair    . Asd repair, sinus venosus    . Testicle surgery      testicular cancer surgery  . Cardiac defibrillator placement  08/2010  . Left heart catheterization with coronary angiogram N/A 06/14/2014    Procedure: LEFT HEART CATHETERIZATION WITH CORONARY ANGIOGRAM;  Surgeon: Shaune Pascal Bensimhon,  MD;  Location: [place] CATH LAB;  Service: Cardiovascular;  Laterality: N/A;    Social History  Substance Use Topics  . Smoking status: Current Some Day Smoker -- 1.50 packs/day for 29 years    Types: Cigarettes, E-cigarettes  . Smokeless tobacco: Never Used     Comment: PT STOPPED SMOKING CIGS 03/27/13. NOW CURRENTLY USING E-CIG and smoking a few cigerettes per week.  . Alcohol Use: No    Family History  Problem Relation Age of Onset  . Heart disease Father 64    Died of MI    Allergies  Allergen Reactions  . Darvocet [Propoxyphene N-Acetaminophen] Anaphylaxis    Throat closes  .  Propoxyphene Anaphylaxis and Swelling  . Levaquin [Levofloxacin In D5w]     Joint aches    Medication list has been reviewed and updated.  Current Outpatient Prescriptions on File Prior to Visit  Medication Sig Dispense Refill  . alfuzosin (UROXATRAL) 10 MG 24 hr tablet Take 10 mg by mouth as needed.     Marland Kitchen aspirin 325 MG tablet Take 325 mg by mouth daily.    . carvedilol (COREG) 25 MG tablet Take 25 mg by mouth 2 (two) times daily.     . clopidogrel (PLAVIX) 75 MG tablet TAKE 1 TABLET EACH DAY. 30 tablet 6  . diazepam (VALIUM) 10 MG tablet Take 10 mg by mouth 2 (two) times daily.     Marland Kitchen gabapentin (NEURONTIN) 600 MG tablet Take 600 mg by mouth daily as needed.     Marland Kitchen lisinopril (PRINIVIL,ZESTRIL) 10 MG tablet Take 10 mg by mouth daily.     Marland Kitchen LORazepam (ATIVAN) 1 MG tablet Take 1 mg by mouth 4 (four) times daily as needed. For anxiety    . morphine (MS CONTIN) 100 MG 12 hr tablet Take 1 tablet (100 mg total) by mouth 3 (three) times daily. 90 tablet 0  . morphine (MSIR) 30 MG tablet Take 1 tablet (30 mg total) by mouth every 6 (six) hours as needed for severe pain. 120 tablet 0  . nitroGLYCERIN (NITROSTAT) 0.4 MG SL tablet Place 0.4 mg under the tongue every 5 (five) minutes as needed for chest pain.    Marland Kitchen QVAR 40 MCG/ACT inhaler INHALE 1 PUFF TWICE DAILY AS DIRECTED. 8.7 g 5  . rosuvastatin (CRESTOR) 40 MG tablet Take 40 mg by mouth daily.    . sildenafil (VIAGRA) 100 MG tablet TAKE ONE TABLET BY MOUTH AS NEEDED FOR ERECTILE DYSFUNCTION. 4 tablet 6  . spironolactone (ALDACTONE) 25 MG tablet Take 0.5 tablets (12.5 mg total) by mouth daily. 15 tablet 5  . testosterone cypionate (DEPOTESTOTERONE CYPIONATE) 200 MG/ML injection Inject into the muscle every 14 (fourteen) days.      Marland Kitchen torsemide (DEMADEX) 20 MG tablet Take 1 tablet (20 mg total) by mouth daily as needed. For swelling in legs 30 tablet 6  . traZODone (DESYREL) 100 MG tablet Take 100 mg by mouth at bedtime as needed for sleep.     Marland Kitchen  ZETIA 10 MG tablet TAKE 1 TABLET EACH DAY. 30 tablet 6  . zolpidem (AMBIEN) 10 MG tablet Take 1 tablet by mouth at bedtime as needed.  4  . Alum & Mag Hydroxide-Simeth (MAGIC MOUTHWASH W/LIDOCAINE) SOLN Take 5 mLs by mouth 4 (four) times daily as needed for mouth pain. (Patient not taking: Reported on 06/12/2015) 160 mL 0  . Linaclotide (LINZESS) 145 MCG CAPS capsule Take 1 capsule (145 mcg total) by mouth 2 (two) times daily. (Patient  not taking: Reported on 06/12/2015) 30 capsule 6   No current facility-administered medications on file prior to visit.    Review of Systems:  As per HPI- otherwise negative.   Physical Examination: Filed Vitals:   06/12/15 1049  BP: 130/84  Pulse: 77  Temp: 98.1 F (36.7 C)  Resp: 16   Filed Vitals:   06/12/15 1049  Height: 5' 11.25" (1.81 m)  Weight: 210 lb 3.2 oz (95.346 kg)   Body mass index is 29.1 kg/(m^2). Ideal Body Weight: Weight in (lb) to have BMI = 25: 180.1  GEN: WDWN, NAD, Non-toxic, A & O x 3, looks well HEENT: Atraumatic, Normocephalic. Neck supple. No masses, No LAD.  Stye left upper lid Bilateral TM wnl, oropharynx normal.  PEERL,EOMI.   Limited fundoscopic exam normal  Ears and Nose: No external deformity. CV: RRR, No M/G/R. No JVD. No thrill. No extra heart sounds. PULM: CTA B, no wheezes, crackles, rhonchi. No retractions. No resp. distress. No accessory muscle use. EXTR: No c/c/e NEURO Normal gait.  PSYCH: Normally interactive. Conversant. Not depressed or anxious appearing.  Calm demeanor.  He has only his right testicle- this displays an approx 2 cm surgical wound on the lateral aspect.  Removed gauze packing.  Wound bed looks good, well perfused and healthy.  No pus.  Laid 1/4 packing into the wound and dressed   Assessment and Plan: Encounter for wound care  Stye, left - Plan: erythromycin ophthalmic ointment  Here today for wound care- packing and bandage changed as above.  He will come in tomorrow for repeat  dressing change.  He mentions that he was supposed to do wet to dry dressing changes at home twice a day- as he is not really able to do this at home and will have daily dressing changes did not place a wet dressing Treat stye as per pt instructions   Signed Lamar Blinks, MD

## 2015-06-12 NOTE — Patient Instructions (Signed)
Come and see [place] tomorrow for wound care.  Before you come in tomorrow you can shower and pat the area dry.  Try to leave the dressing on when you shower but it is ok if it comes off.   You can certainly take a "bird bath" today Your wound appears to be doing well- I think it is going to do fine  Use the eye ointment (apply a 1cm ribbon to the lower lid) 3 times a day and use warm compresses on the lid.  This should clear up the stye- let me know if not better in a few days

## 2015-06-13 ENCOUNTER — Ambulatory Visit (INDEPENDENT_AMBULATORY_CARE_PROVIDER_SITE_OTHER): Payer: BLUE CROSS/BLUE SHIELD | Admitting: Family Medicine

## 2015-06-13 ENCOUNTER — Ambulatory Visit (INDEPENDENT_AMBULATORY_CARE_PROVIDER_SITE_OTHER): Payer: BLUE CROSS/BLUE SHIELD | Admitting: *Deleted

## 2015-06-13 VITALS — BP 112/70 | HR 84 | Temp 98.2°F | Resp 16

## 2015-06-13 DIAGNOSIS — H00016 Hordeolum externum left eye, unspecified eyelid: Secondary | ICD-10-CM

## 2015-06-13 DIAGNOSIS — N499 Inflammatory disorder of unspecified male genital organ: Secondary | ICD-10-CM

## 2015-06-13 DIAGNOSIS — I255 Ischemic cardiomyopathy: Secondary | ICD-10-CM

## 2015-06-13 DIAGNOSIS — Z5189 Encounter for other specified aftercare: Secondary | ICD-10-CM

## 2015-06-13 DIAGNOSIS — N492 Inflammatory disorders of scrotum: Secondary | ICD-10-CM

## 2015-06-13 NOTE — Progress Notes (Signed)
Subjective:    Patient ID: [name], male    DOB: 1961-06-05, 54 y.o.   MRN: [id number]  06/13/2015  Wound Check   HPI This 54 y.o. male presents for 24 hour follow-up for testicular abscess s/p I&D by urology 48 hours ago.  S/p testicular ultrasound 72 hours ago that was negative for mass/nodule; history of testicular cancer in  The past.  Presenting for wound check and repacking of wound.  Denies fever/chills/sweats.  Has not changed bandage since yesterday; was to return this morning but was not able to do so.  Compliance with Levaquin therapy; did take two Levaquin tablets the first two days of medication.  Has not been notified of wound culture results from urology office.  L eye stye has enlarged from yesterday and patient is concerned. Did apply warm steam to face/eye yesterday.  No drainage.  No vision changes; no redness to conjunctiva; no eye drainage. Compliance with erythromycin ointment.   Review of Systems  Constitutional: Negative for fever, chills, diaphoresis and fatigue.  Eyes: Positive for pain. Negative for photophobia, discharge, redness, itching and visual disturbance.  Genitourinary: Positive for penile swelling and genital sores. Negative for urgency, discharge and testicular pain.  Skin: Positive for wound. Negative for color change, pallor and rash.    Past Medical History  Diagnosis Date  . Chronic systolic heart failure (HCC)     EF 20-25%. s/p ST. Jude ICD  . CAD (coronary artery disease)     Last cath 2/12. 3-v CAD. Failed PCI of distal RCAc  CATH [facility] 4/13 with DES to  LAD X2  . Chronic back pain     lumbar stenosis  . Benign prostatic hypertrophy   . Depression   . History of testicular cancer   . Narcotic dependence (HCC)     chronic  . Benzodiazepine dependence (HCC)     chronic  . Anxiety   . CHF (congestive heart failure) ([place])   . DJD (degenerative joint disease)   . Automatic implantable cardiac defibrillator St Judes     Analyze ST     Past Surgical History  Procedure Laterality Date  . Hernia repair    . Asd repair, sinus venosus    . Testicle surgery      testicular cancer surgery  . Cardiac defibrillator placement  08/2010  . Left heart catheterization with coronary angiogram N/A 06/14/2014    Procedure: LEFT HEART CATHETERIZATION WITH CORONARY ANGIOGRAM;  Surgeon: Jolaine Artist, MD;  Location: [facility] CATH LAB;  Service: Cardiovascular;  Laterality: N/A;   Allergies  Allergen Reactions  . Darvocet [Propoxyphene N-Acetaminophen] Anaphylaxis    Throat closes  . Propoxyphene Anaphylaxis and Swelling  . Levaquin [Levofloxacin In D5w]     Joint aches   Current Outpatient Prescriptions  Medication Sig Dispense Refill  . alfuzosin (UROXATRAL) 10 MG 24 hr tablet Take 10 mg by mouth as needed.     . Alum & Mag Hydroxide-Simeth (MAGIC MOUTHWASH W/LIDOCAINE) SOLN Take 5 mLs by mouth 4 (four) times daily as needed for mouth pain. 160 mL 0  . aspirin 325 MG tablet Take 325 mg by mouth daily.    . carvedilol (COREG) 25 MG tablet Take 25 mg by mouth 2 (two) times daily.     . clopidogrel (PLAVIX) 75 MG tablet TAKE 1 TABLET EACH DAY. 30 tablet 6  . diazepam (VALIUM) 10 MG tablet Take 10 mg by mouth 2 (two) times daily.     Marland Kitchen erythromycin  ophthalmic ointment Place 1 application into the left eye 3 (three) times daily. Use as needed to lubricate eye 3.5 g 0  . gabapentin (NEURONTIN) 600 MG tablet Take 600 mg by mouth daily as needed.     . LevoFLOXacin (LEVAQUIN PO) Take by mouth.    . Linaclotide (LINZESS) 145 MCG CAPS capsule Take 1 capsule (145 mcg total) by mouth 2 (two) times daily. 30 capsule 6  . lisinopril (PRINIVIL,ZESTRIL) 10 MG tablet Take 10 mg by mouth daily.     Marland Kitchen LORazepam (ATIVAN) 1 MG tablet Take 1 mg by mouth 4 (four) times daily as needed. For anxiety    . meloxicam (MOBIC) 15 MG tablet Take 15 mg by mouth daily.    Marland Kitchen morphine (MS CONTIN) 100 MG 12 hr tablet Take 1 tablet (100 mg total) by mouth 3 (three)  times daily. 90 tablet 0  . morphine (MSIR) 30 MG tablet Take 1 tablet (30 mg total) by mouth every 6 (six) hours as needed for severe pain. 120 tablet 0  . nitroGLYCERIN (NITROSTAT) 0.4 MG SL tablet Place 0.4 mg under the tongue every 5 (five) minutes as needed for chest pain.    Marland Kitchen QVAR 40 MCG/ACT inhaler INHALE 1 PUFF TWICE DAILY AS DIRECTED. 8.7 g 5  . rosuvastatin (CRESTOR) 40 MG tablet Take 40 mg by mouth daily.    . sildenafil (VIAGRA) 100 MG tablet TAKE ONE TABLET BY MOUTH AS NEEDED FOR ERECTILE DYSFUNCTION. 4 tablet 6  . spironolactone (ALDACTONE) 25 MG tablet Take 0.5 tablets (12.5 mg total) by mouth daily. 15 tablet 5  . testosterone cypionate (DEPOTESTOTERONE CYPIONATE) 200 MG/ML injection Inject into the muscle every 14 (fourteen) days.      Marland Kitchen torsemide (DEMADEX) 20 MG tablet Take 1 tablet (20 mg total) by mouth daily as needed. For swelling in legs 30 tablet 6  . traZODone (DESYREL) 100 MG tablet Take 100 mg by mouth at bedtime as needed for sleep.     Marland Kitchen ZETIA 10 MG tablet TAKE 1 TABLET EACH DAY. 30 tablet 6  . zolpidem (AMBIEN) 10 MG tablet Take 1 tablet by mouth at bedtime as needed.  4   No current facility-administered medications for this visit.   Social History   Social History  . Marital Status: Married    Spouse Name: N/A  . Number of Children: N/A  . Years of Education: N/A   Occupational History  . Not on file.   Social History Main Topics  . Smoking status: Current Some Day Smoker -- 1.50 packs/day for 29 years    Types: Cigarettes, E-cigarettes  . Smokeless tobacco: Never Used     Comment: PT STOPPED SMOKING CIGS 03/27/13. NOW CURRENTLY USING E-CIG and smoking a few cigerettes per week.  . Alcohol Use: No  . Drug Use: Yes    Special: Marijuana     Comment: Urine showed THC  . Sexual Activity: Not on file   Other Topics Concern  . Not on file   Social History Narrative   Family History  Problem Relation Age of Onset  . Heart disease Father 27     Died of MI       Objective:    BP 112/70 mmHg  Pulse 84  Temp(Src) 98.2 F (36.8 C)  Resp 16  SpO2 97% Physical Exam  Constitutional: He appears well-developed and well-nourished. No distress.  Eyes: Conjunctivae and EOM are normal. Pupils are equal, round, and reactive to light. Left eye exhibits  hordeolum. Left conjunctiva is not injected. Left conjunctiva has no hemorrhage.    Genitourinary:     R SCROTUM: large open incision lateral aspect of R testicle; packing removed; minimal surrounding erythema; packing saturated with bloody drainage; no active pus in wound; wound irrigated with sterile saline and packing reapplied.  Pt tolerated well. Bandage applied.  Skin: He is not diaphoretic.        Assessment & Plan:   1. Abscess of scrotal wall   2. Encounter for wound care   3. Hordeolum, left     1.  Abscess scrotal wall/encounter for wound care: Improving; s/p repacking of abscess; RTC 24 hours for reevaluation with Dr. Lorelei Pont. Continue Levaquin ONE tablet daily.   2.  L hordeolum:  Worsening; increase warm compresses to tid. Continue erythromycin ointment.   No orders of the defined types were placed in this encounter.   No orders of the defined types were placed in this encounter.    Return in about 1 day (around 06/14/2015) for recheck with [place].    Kynzley Dowson Elayne Guerin, M.D. [facility] [address] [place], [place]  [address] [phone] phone [phone] fax

## 2015-06-14 ENCOUNTER — Ambulatory Visit (INDEPENDENT_AMBULATORY_CARE_PROVIDER_SITE_OTHER): Payer: BLUE CROSS/BLUE SHIELD | Admitting: Family Medicine

## 2015-06-14 ENCOUNTER — Ambulatory Visit (HOSPITAL_BASED_OUTPATIENT_CLINIC_OR_DEPARTMENT_OTHER)
Admission: RE | Admit: 2015-06-14 | Discharge: 2015-06-14 | Disposition: A | Discharge status: Home / Self Care | Payer: BLUE CROSS/BLUE SHIELD | Source: Ambulatory Visit | Attending: Family Medicine | Admitting: Family Medicine

## 2015-06-14 ENCOUNTER — Ambulatory Visit (HOSPITAL_COMMUNITY): Payer: BLUE CROSS/BLUE SHIELD

## 2015-06-14 VITALS — BP 110/68 | HR 69 | Temp 97.7°F | Resp 16 | Ht 71.25 in | Wt 210.0 lb

## 2015-06-14 DIAGNOSIS — N492 Inflammatory disorders of scrotum: Secondary | ICD-10-CM

## 2015-06-14 DIAGNOSIS — Z9079 Acquired absence of other genital organ(s): Secondary | ICD-10-CM | POA: Insufficient documentation

## 2015-06-14 DIAGNOSIS — R1031 Right lower quadrant pain: Secondary | ICD-10-CM | POA: Insufficient documentation

## 2015-06-14 DIAGNOSIS — N499 Inflammatory disorder of unspecified male genital organ: Secondary | ICD-10-CM

## 2015-06-14 DIAGNOSIS — R938 Abnormal findings on diagnostic imaging of other specified body structures: Secondary | ICD-10-CM | POA: Diagnosis not present

## 2015-06-14 MED ORDER — SULFAMETHOXAZOLE-TRIMETHOPRIM 800-160 MG PO TABS: 1.0000 | ORAL_TABLET | Freq: Two times a day (BID) | ORAL | Status: DC

## 2015-06-14 MED ORDER — SULFAMETHOXAZOLE-TRIMETHOPRIM 400-80 MG PO TABS: 1.0000 | ORAL_TABLET | Freq: Two times a day (BID) | ORAL | Status: DC

## 2015-06-14 NOTE — Patient Instructions (Addendum)
YOU ARE TO GO OVER TO [facility] NOW TO THE ER DEPARTMENT AND REGISTER AS AN OUTPATIENT FOR ULTRASOUND. Address: [address], [place], [place] [address]  Phone: [phone]

## 2015-06-14 NOTE — Progress Notes (Signed)
[facility] [address] [place] [address] [phone]  Date:  06/14/2015   Name:  [name]   DOB:  [birth year]   MRN:  [id number]  PCP:  Lamar Blinks, MD    Chief Complaint: Wound Check   History of Present Illness:  [name] is a 54 y.o. very pleasant male patient who presents with the following:  Here today for wound care- he has been here over the last 2 days to have the bandage changed on his scrotal abscess.  He had an I and D on 11/28 at his urology office.  He thought he was ok but upon undressing at clinic he noticed that the area superior to surgical wound is indurated and more tender, and there is some redness of the skin He has not noted any fever, and overall he has felt ok His left eye stye is still there- he is doing compresses   Patient Active Problem List   Diagnosis Date Noted  . Constipation 09/04/2014  . Unstable angina ([place]) 06/14/2014  . Therapeutic opioid induced constipation 04/02/2014  . Decreased body weight 11/08/2013  . Hypoxemia 09/25/2013  . Polycythemia 09/18/2013  . Erectile dysfunction 11/24/2012  . 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 MAI (mycobacterium avium-intracellulare) ([place]) 05/16/2012  . 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 Pre-operative cardiovascular examination 11/21/2011  . 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 PAD (peripheral artery disease) ([place]) 02/24/2011  . ICD-St.Jude 12/24/2010  . Depression 12/09/2010  . Tobacco abuse 12/09/2010  . cHistory of testicular cancer 12/09/2010  . Benzodiazepine dependence ([place]) 12/09/2010  . Narcotic dependence ([place]) 12/09/2010  . Chronic back pain 12/05/2010  . Anxiety disorder 10/17/2010  . CAD, NATIVE VESSEL 09/15/2010  . COMBINED HEART FAILURE, CHRONIC 09/15/2010  . CARDIOMYOPATHY, ISCHEMIC 09/12/2010    Past Medical History  Diagnosis Date  . Chronic systolic heart failure (HCC)     EF 20-25%.  s/p ST. Jude ICD  . CAD (coronary artery disease)     Last cath 2/12. 3-v CAD. Failed PCI of distal RCAc  CATH [facility] 4/13 with DES to  LAD X2  . Chronic back pain     lumbar stenosis  . Benign prostatic hypertrophy   . Depression   . History of testicular cancer   . Narcotic dependence (HCC)     chronic  . Benzodiazepine dependence (HCC)     chronic  . Anxiety   . CHF (congestive heart failure) ([place])   . DJD (degenerative joint disease)   . Automatic implantable cardiac defibrillator St Judes     Analyze ST    Past Surgical History  Procedure Laterality Date  . Hernia repair    . Asd repair, sinus venosus    . Testicle surgery      testicular cancer surgery  . Cardiac defibrillator placement  08/2010  . Left heart catheterization with coronary angiogram N/A 06/14/2014    Procedure: LEFT HEART CATHETERIZATION WITH CORONARY ANGIOGRAM;  Surgeon: Jolaine Artist, MD;  Location: [facility] CATH LAB;  Service: Cardiovascular;  Laterality: N/A;    Social History  Substance Use Topics  . Smoking status: Current Some Day Smoker -- 1.50 packs/day for 29 years    Types: Cigarettes, E-cigarettes  . Smokeless tobacco: Never Used     Comment: PT STOPPED SMOKING CIGS 03/27/13. NOW CURRENTLY USING E-CIG and smoking a few cigerettes per week.  . Alcohol Use: No    Family  History  Problem Relation Age of Onset  . Heart disease Father 48    Died of MI    Allergies  Allergen Reactions  . Darvocet [Propoxyphene N-Acetaminophen] Anaphylaxis    Throat closes  . Propoxyphene Anaphylaxis and Swelling  . Levaquin [Levofloxacin In D5w]     Joint aches    Medication list has been reviewed and updated.  Current Outpatient Prescriptions on File Prior to Visit  Medication Sig Dispense Refill  . alfuzosin (UROXATRAL) 10 MG 24 hr tablet Take 10 mg by mouth as needed.     . Alum & Mag Hydroxide-Simeth (MAGIC MOUTHWASH W/LIDOCAINE) SOLN Take 5 mLs by mouth 4 (four) times daily as needed for mouth  pain. 160 mL 0  . aspirin 325 MG tablet Take 325 mg by mouth daily.    . carvedilol (COREG) 25 MG tablet Take 25 mg by mouth 2 (two) times daily.     . clopidogrel (PLAVIX) 75 MG tablet TAKE 1 TABLET EACH DAY. 30 tablet 6  . diazepam (VALIUM) 10 MG tablet Take 10 mg by mouth 2 (two) times daily.     Marland Kitchen erythromycin ophthalmic ointment Place 1 application into the left eye 3 (three) times daily. Use as needed to lubricate eye 3.5 g 0  . gabapentin (NEURONTIN) 600 MG tablet Take 600 mg by mouth daily as needed.     . LevoFLOXacin (LEVAQUIN PO) Take by mouth.    . Linaclotide (LINZESS) 145 MCG CAPS capsule Take 1 capsule (145 mcg total) by mouth 2 (two) times daily. 30 capsule 6  . lisinopril (PRINIVIL,ZESTRIL) 10 MG tablet Take 10 mg by mouth daily.     Marland Kitchen LORazepam (ATIVAN) 1 MG tablet Take 1 mg by mouth 4 (four) times daily as needed. For anxiety    . meloxicam (MOBIC) 15 MG tablet Take 15 mg by mouth daily.    Marland Kitchen morphine (MS CONTIN) 100 MG 12 hr tablet Take 1 tablet (100 mg total) by mouth 3 (three) times daily. 90 tablet 0  . morphine (MSIR) 30 MG tablet Take 1 tablet (30 mg total) by mouth every 6 (six) hours as needed for severe pain. 120 tablet 0  . nitroGLYCERIN (NITROSTAT) 0.4 MG SL tablet Place 0.4 mg under the tongue every 5 (five) minutes as needed for chest pain.    Marland Kitchen QVAR 40 MCG/ACT inhaler INHALE 1 PUFF TWICE DAILY AS DIRECTED. 8.7 g 5  . rosuvastatin (CRESTOR) 40 MG tablet Take 40 mg by mouth daily.    . sildenafil (VIAGRA) 100 MG tablet TAKE ONE TABLET BY MOUTH AS NEEDED FOR ERECTILE DYSFUNCTION. 4 tablet 6  . spironolactone (ALDACTONE) 25 MG tablet Take 0.5 tablets (12.5 mg total) by mouth daily. 15 tablet 5  . testosterone cypionate (DEPOTESTOTERONE CYPIONATE) 200 MG/ML injection Inject into the muscle every 14 (fourteen) days.      Marland Kitchen torsemide (DEMADEX) 20 MG tablet Take 1 tablet (20 mg total) by mouth daily as needed. For swelling in legs 30 tablet 6  . traZODone (DESYREL) 100  MG tablet Take 100 mg by mouth at bedtime as needed for sleep.     Marland Kitchen ZETIA 10 MG tablet TAKE 1 TABLET EACH DAY. 30 tablet 6  . zolpidem (AMBIEN) 10 MG tablet Take 1 tablet by mouth at bedtime as needed.  4   No current facility-administered medications on file prior to visit.    Review of Systems:  As per HPI- otherwise negative.   Physical Examination: Filed Vitals:  06/14/15 1643  BP: 110/68  Pulse: 69  Temp: 97.7 F (36.5 C)  Resp: 16   Filed Vitals:   06/14/15 1643  Height: 5' 11.25" (1.81 m)  Weight: 210 lb (95.255 kg)   Body mass index is 29.08 kg/(m^2). Ideal Body Weight: Weight in (lb) to have BMI = 25: 180.1  GEN: WDWN, NAD, Non-toxic, A & O x 3, looks well HEENT: Atraumatic, Normocephalic. Neck supple. No masses, No LAD.  Stye left upper lid appears about ready to drain- encouraged him to continue to use compresses  Ears and Nose: No external deformity. CV: RRR, No M/G/R. No JVD. No thrill. No extra heart sounds. PULM: CTA B, no wheezes, crackles, rhonchi. No retractions. No resp. distress. No accessory muscle use. ABD: S, NT, ND EXTR: No c/c/e NEURO Normal gait.  PSYCH: Normally interactive. Conversant. Not depressed or anxious appearing.  Calm demeanor.  GU: removed packing from surgical wound on right side of scrotum.  Wound appears to be healing well and is healthy.  However superior to the wound over the inguinal canal it feels more indurated and is tender,.  There is slight redness of the overlying skin in this area.    Send for an US of the scrotum today- results as below Assessment and Plan: Abscess of scrotal wall - Plan: US Scrotum, sulfamethoxazole-trimethoprim (BACTRIM DS,SEPTRA DS) 800-160 MG tablet, DISCONTINUED: sulfamethoxazole-trimethoprim (BACTRIM) 400-80 MG tablet  Referral for [place] today.  Called and consulted with urologist on call for [facility].  Discussed [place] results and plan.  Wound culture from their clinic did grow MRSA.  Will stop levaquin  and change to bactrim, 1 DS BID.  Follow-up tomorrow- advised pt of same.  He will see [place] tomorrow for a recheck   IMPRESSION: 1. Newly demonstrated thickening of the body of the epididymis. Striated appearance of the right testicular parenchyma suggesting mild edema. These findings are nonspecific and could represent mild reactive edema versus developing right epididymo-orchitis. 2. Newly demonstrated 1.0 cm cystic structure in the body of the right epididymis with low level internal echoes. Given the absence of a thick wall or internal gas, this could represent a newly demonstrated spermatocele, however a developing right epididymal body abscess cannot be excluded. Urology consultation and short-term sonographic follow-up are advised. 3. No evidence of right testicular torsion. 4. Tiny 0.2 cm hypoechoic structure at the superior margin of the right testis of uncertain significance, recommend attention on follow-up scrotal sonogram in 1 month. 5. Status post left orchiectomy. No abnormal findings in the left scrotum. 6. No residual fluid collection is detected at the area of recent percutaneous drainage in the right scrotum.    Signed Lamar Blinks, MD

## 2015-06-14 NOTE — Telephone Encounter (Signed)
Dr Lorelei Pont, it looks like you are seeing pt today. Can you please address this med? I don't see that constipation has been addressed lately.

## 2015-06-14 NOTE — Progress Notes (Signed)
Remote ICD transmission.   

## 2015-06-16 ENCOUNTER — Ambulatory Visit (INDEPENDENT_AMBULATORY_CARE_PROVIDER_SITE_OTHER): Payer: BLUE CROSS/BLUE SHIELD

## 2015-06-16 ENCOUNTER — Ambulatory Visit (INDEPENDENT_AMBULATORY_CARE_PROVIDER_SITE_OTHER): Payer: BLUE CROSS/BLUE SHIELD | Admitting: Emergency Medicine

## 2015-06-16 VITALS — BP 112/72 | HR 67 | Temp 98.3°F | Resp 20

## 2015-06-16 DIAGNOSIS — S20211A Contusion of right front wall of thorax, initial encounter: Secondary | ICD-10-CM

## 2015-06-16 NOTE — Patient Instructions (Signed)
Chest Contusion A chest contusion is a deep bruise on your chest area. Contusions are the result of an injury that caused bleeding under the skin. A chest contusion may involve bruising of the skin, muscles, or ribs. The contusion may turn blue, purple, or yellow. Minor injuries will give you a painless contusion, but more severe contusions may stay painful and swollen for a few weeks. CAUSES  A contusion is usually caused by a blow, trauma, or direct force to an area of the body. SYMPTOMS   Swelling and redness of the injured area.  Discoloration of the injured area.  Tenderness and soreness of the injured area.  Pain. DIAGNOSIS  The diagnosis can be made by taking a history and performing a physical exam. An X-ray, CT scan, or MRI may be needed to determine if there were any associated injuries, such as broken bones (fractures) or internal injuries. TREATMENT  Often, the best treatment for a chest contusion is resting, icing, and applying cold compresses to the injured area. Deep breathing exercises may be recommended to reduce the risk of pneumonia. Over-the-counter medicines may also be recommended for pain control. HOME CARE INSTRUCTIONS   Put ice on the injured area.  Put ice in a plastic bag.  Place a towel between your skin and the bag.  Leave the ice on for 15-20 minutes, 03-04 times a day.  Only take over-the-counter or prescription medicines as directed by your caregiver. Your caregiver may recommend avoiding anti-inflammatory medicines (aspirin, ibuprofen, and naproxen) for 48 hours because these medicines may increase bruising.  Rest the injured area.  Perform deep-breathing exercises as directed by your caregiver.  Stop smoking if you smoke.  Do not lift objects over 5 pounds (2.3 kg) for 3 days or longer if recommended by your caregiver. SEEK IMMEDIATE MEDICAL CARE IF:   You have increased bruising or swelling.  You have pain that is getting worse.  You have  difficulty breathing.  You have dizziness, weakness, or fainting.  You have blood in your urine or stool.  You cough up or vomit blood.  Your swelling or pain is not relieved with medicines. MAKE SURE YOU:   Understand these instructions.  Will watch your condition.  Will get help right away if you are not doing well or get worse.   This information is not intended to replace advice given to you by your health care provider. Make sure you discuss any questions you have with your health care provider.   Document Released: 03/24/2001 Document Revised: 03/23/2012 Document Reviewed: 12/21/2011 Elsevier Interactive Patient Education 2016 [employer]  

## 2015-06-16 NOTE — Progress Notes (Signed)
Subjective:  Patient ID: [name], male    DOB: Sep 23, 1960  Age: 54 y.o. MRN: [id number]  CC: Wound Check   HPI [name] presents  patient has a number complaints. He was apparently fell off the toilet landed on his right lateral chest wall against a bidet and since he's been having a pleuritic chest pain in his right chest wall he is concerned that he may have a broken rib okay against his lung. He has no hemoptysis shortness breath fever chills cough or other complaints he has no bruising or ecchymosis.  He's come to have the packing changed on his scrotal wound. No drainage has no fever chills  History Isaih has a past medical history of Chronic systolic heart failure ([place]); CAD (coronary artery disease); Chronic back pain; Benign prostatic hypertrophy; Depression; History of testicular cancer; Narcotic dependence ([place]); Benzodiazepine dependence ([place]); Anxiety; CHF (congestive heart failure) ([place]); DJD (degenerative joint disease); and Automatic implantable cardiac defibrillator [employer].   He has past surgical history that includes Hernia repair; ASD repair, sinus venosus; Testicle surgery; Cardiac defibrillator placement (08/2010); and left heart catheterization with coronary angiogram (N/A, 06/14/2014).   His  family history includes Heart disease (age of onset: 27) in his father.  He   reports that he has been smoking Cigarettes and E-cigarettes.  He has a 43.5 pack-year smoking history. He has never used smokeless tobacco. He reports that he uses illicit drugs (Marijuana). He reports that he does not drink alcohol.  Outpatient Prescriptions Prior to Visit  Medication Sig Dispense Refill  . alfuzosin (UROXATRAL) 10 MG 24 hr tablet Take 10 mg by mouth as needed.     . Alum & Mag Hydroxide-Simeth (MAGIC MOUTHWASH W/LIDOCAINE) SOLN Take 5 mLs by mouth 4 (four) times daily as needed for mouth pain. 160 mL 0  . aspirin 325 MG tablet Take 325 mg by mouth daily.    .  carvedilol (COREG) 25 MG tablet Take 25 mg by mouth 2 (two) times daily.     . diazepam (VALIUM) 10 MG tablet Take 10 mg by mouth 2 (two) times daily.     Marland Kitchen erythromycin ophthalmic ointment Place 1 application into the left eye 3 (three) times daily. Use as needed to lubricate eye 3.5 g 0  . gabapentin (NEURONTIN) 600 MG tablet Take 600 mg by mouth daily as needed.     . Linaclotide (LINZESS) 145 MCG CAPS capsule Take 1 capsule (145 mcg total) by mouth 2 (two) times daily. 30 capsule 6  . lisinopril (PRINIVIL,ZESTRIL) 10 MG tablet Take 10 mg by mouth daily.     Marland Kitchen LORazepam (ATIVAN) 1 MG tablet Take 1 mg by mouth 4 (four) times daily as needed. For anxiety    . meloxicam (MOBIC) 15 MG tablet Take 15 mg by mouth daily.    Marland Kitchen morphine (MS CONTIN) 100 MG 12 hr tablet Take 1 tablet (100 mg total) by mouth 3 (three) times daily. 90 tablet 0  . morphine (MSIR) 30 MG tablet Take 1 tablet (30 mg total) by mouth every 6 (six) hours as needed for severe pain. 120 tablet 0  . nitroGLYCERIN (NITROSTAT) 0.4 MG SL tablet Place 0.4 mg under the tongue every 5 (five) minutes as needed for chest pain.    . polyethylene glycol powder (GLYCOLAX/MIRALAX) powder MIX 1 CAPFUL TWICE DAILY AS NEEDED. 250 g 0  . QVAR 40 MCG/ACT inhaler INHALE 1 PUFF TWICE DAILY AS DIRECTED. 8.7 g 5  .  rosuvastatin (CRESTOR) 40 MG tablet Take 40 mg by mouth daily.    . sildenafil (VIAGRA) 100 MG tablet TAKE ONE TABLET BY MOUTH AS NEEDED FOR ERECTILE DYSFUNCTION. 4 tablet 6  . spironolactone (ALDACTONE) 25 MG tablet Take 0.5 tablets (12.5 mg total) by mouth daily. 15 tablet 5  . sulfamethoxazole-trimethoprim (BACTRIM DS,SEPTRA DS) 800-160 MG tablet Take 1 tablet by mouth 2 (two) times daily. 20 tablet 0  . testosterone cypionate (DEPOTESTOTERONE CYPIONATE) 200 MG/ML injection Inject into the muscle every 14 (fourteen) days.      Marland Kitchen torsemide (DEMADEX) 20 MG tablet Take 1 tablet (20 mg total) by mouth daily as needed. For swelling in legs 30  tablet 6  . traZODone (DESYREL) 100 MG tablet Take 100 mg by mouth at bedtime as needed for sleep.     Marland Kitchen ZETIA 10 MG tablet TAKE 1 TABLET EACH DAY. 30 tablet 6  . zolpidem (AMBIEN) 10 MG tablet Take 1 tablet by mouth at bedtime as needed.  4  . clopidogrel (PLAVIX) 75 MG tablet TAKE 1 TABLET EACH DAY. (Patient not taking: Reported on 06/16/2015) 30 tablet 6   No facility-administered medications prior to visit.    Social History   Social History  . Marital Status: Married    Spouse Name: N/A  . Number of Children: N/A  . Years of Education: N/A   Social History Main Topics  . Smoking status: Current Some Day Smoker -- 1.50 packs/day for 29 years    Types: Cigarettes, E-cigarettes  . Smokeless tobacco: Never Used     Comment: PT STOPPED SMOKING CIGS 03/27/13. NOW CURRENTLY USING E-CIG and smoking a few cigerettes per week.  . Alcohol Use: No  . Drug Use: Yes    Special: Marijuana     Comment: Urine showed THC  . Sexual Activity: Not Asked   Other Topics Concern  . None   Social History Narrative     Review of Systems  Constitutional: Negative for fever, chills and appetite change.  HENT: Negative for congestion, ear pain, postnasal drip, sinus pressure and sore throat.   Eyes: Negative for pain and redness.  Respiratory: Positive for chest tightness (right lateral chest wall tenderness following fall.  pleuritic pain). Negative for cough, shortness of breath and wheezing.   Cardiovascular: Negative for leg swelling.  Gastrointestinal: Negative for nausea, vomiting, abdominal pain, diarrhea, constipation and blood in stool.  Endocrine: Negative for polyuria.  Genitourinary: Negative for dysuria, urgency, frequency and flank pain.  Musculoskeletal: Negative for gait problem.  Skin: Negative for rash.  Neurological: Negative for weakness and headaches.  Psychiatric/Behavioral: Negative for confusion and decreased concentration. The patient is not nervous/anxious.      Objective:  BP 112/72 mmHg  Pulse 67  Temp(Src) 98.3 F (36.8 C) (Oral)  Resp 20  SpO2 96%  Physical Exam  Constitutional: He is oriented to person, place, and time. He appears well-developed and well-nourished. No distress.  HENT:  Head: Normocephalic and atraumatic.  Right Ear: External ear normal.  Left Ear: External ear normal.  Nose: Nose normal.  Eyes: Conjunctivae and EOM are normal. Pupils are equal, round, and reactive to light. No scleral icterus.  Neck: Normal range of motion. Neck supple. No tracheal deviation present.  Cardiovascular: Normal rate, regular rhythm and normal heart sounds.   Pulmonary/Chest: Effort normal. No respiratory distress. He has no wheezes. He has no rales. He exhibits tenderness. He exhibits no crepitus, no deformity and no swelling.  Abdominal: He exhibits no  mass. There is no tenderness. There is no rebound and no guarding.  Musculoskeletal: He exhibits no edema.  Lymphadenopathy:    He has no cervical adenopathy.  Neurological: He is alert and oriented to person, place, and time. Coordination normal.  Skin: Skin is warm and dry. No rash noted.  Psychiatric: He has a normal mood and affect. His behavior is normal.     His scrotal wound was free of drainage or erythema or cellulitis is packing was removed and a truck instructed to keep it covered and follow-up as needed  Assessment & Plan:   Elison was seen today for wound check.  Diagnoses and all orders for this visit:  Chest wall contusion, right, initial encounter -     DG Chest 2 View; Future   I am having Mr. Ciliberti maintain his testosterone cypionate, diazepam, LORazepam, torsemide, traZODone, alfuzosin, nitroGLYCERIN, carvedilol, aspirin, gabapentin, lisinopril, rosuvastatin, ZETIA, Linaclotide, zolpidem, QVAR, spironolactone, sildenafil, clopidogrel, magic mouthwash w/lidocaine, morphine, morphine, meloxicam, erythromycin, polyethylene glycol powder, and  sulfamethoxazole-trimethoprim.  No orders of the defined types were placed in this encounter.    Appropriate red flag conditions were discussed with the patient as well as actions that should be taken.  Patient expressed his understanding.  Follow-up: No Follow-up on file.  Roselee Culver, MD   [facility] reading (PRIMARY) by  Dr. Ouida Sills. Negative chest.

## 2015-06-17 ENCOUNTER — Ambulatory Visit (HOSPITAL_COMMUNITY): Payer: BLUE CROSS/BLUE SHIELD

## 2015-06-19 ENCOUNTER — Ambulatory Visit (HOSPITAL_COMMUNITY): Payer: BLUE CROSS/BLUE SHIELD

## 2015-06-21 ENCOUNTER — Ambulatory Visit (HOSPITAL_COMMUNITY): Payer: BLUE CROSS/BLUE SHIELD

## 2015-06-24 ENCOUNTER — Ambulatory Visit (HOSPITAL_COMMUNITY): Payer: BLUE CROSS/BLUE SHIELD

## 2015-06-25 ENCOUNTER — Encounter: Payer: Self-pay | Admitting: Cardiology

## 2015-06-26 ENCOUNTER — Ambulatory Visit (HOSPITAL_COMMUNITY): Payer: BLUE CROSS/BLUE SHIELD

## 2015-06-28 ENCOUNTER — Ambulatory Visit (HOSPITAL_COMMUNITY): Payer: BLUE CROSS/BLUE SHIELD

## 2015-07-01 ENCOUNTER — Ambulatory Visit (HOSPITAL_COMMUNITY): Payer: BLUE CROSS/BLUE SHIELD

## 2015-07-03 ENCOUNTER — Ambulatory Visit (HOSPITAL_COMMUNITY): Payer: BLUE CROSS/BLUE SHIELD

## 2015-07-05 ENCOUNTER — Ambulatory Visit (HOSPITAL_COMMUNITY): Payer: BLUE CROSS/BLUE SHIELD

## 2015-07-09 ENCOUNTER — Other Ambulatory Visit: Payer: Self-pay | Admitting: Internal Medicine

## 2015-07-09 ENCOUNTER — Other Ambulatory Visit: Payer: Self-pay | Admitting: Physical Medicine & Rehabilitation

## 2015-07-09 ENCOUNTER — Encounter: Payer: Self-pay | Admitting: Cardiology

## 2015-07-09 ENCOUNTER — Encounter: Payer: Self-pay | Admitting: Registered Nurse

## 2015-07-09 ENCOUNTER — Encounter: Payer: BLUE CROSS/BLUE SHIELD | Attending: Physical Medicine & Rehabilitation | Admitting: Registered Nurse

## 2015-07-09 VITALS — BP 97/59 | HR 79 | Resp 14

## 2015-07-09 DIAGNOSIS — M47817 Spondylosis without myelopathy or radiculopathy, lumbosacral region: Secondary | ICD-10-CM

## 2015-07-09 DIAGNOSIS — M25551 Pain in right hip: Secondary | ICD-10-CM

## 2015-07-09 DIAGNOSIS — Z5181 Encounter for therapeutic drug level monitoring: Secondary | ICD-10-CM | POA: Diagnosis not present

## 2015-07-09 DIAGNOSIS — G894 Chronic pain syndrome: Secondary | ICD-10-CM | POA: Diagnosis not present

## 2015-07-09 DIAGNOSIS — M25561 Pain in right knee: Secondary | ICD-10-CM

## 2015-07-09 DIAGNOSIS — R5381 Other malaise: Secondary | ICD-10-CM | POA: Diagnosis present

## 2015-07-09 DIAGNOSIS — Z79899 Other long term (current) drug therapy: Secondary | ICD-10-CM | POA: Diagnosis not present

## 2015-07-09 DIAGNOSIS — F1120 Opioid dependence, uncomplicated: Secondary | ICD-10-CM

## 2015-07-09 MED ORDER — MORPHINE SULFATE ER 100 MG PO TBCR: 100.0000 mg | EXTENDED_RELEASE_TABLET | Freq: Three times a day (TID) | ORAL | Status: DC

## 2015-07-09 MED ORDER — MORPHINE SULFATE 30 MG PO TABS: 30.0000 mg | ORAL_TABLET | Freq: Four times a day (QID) | ORAL | Status: DC | PRN

## 2015-07-09 NOTE — Progress Notes (Signed)
Subjective:    Patient ID: [name], male    DOB: November 02, 1960, 54 y.o.   MRN: [id number]  HPI : [name] is a 54 year old male who returns for follow up for chronic pain and medication refill. He says his pain is located in his lower back radiating posteriorly and laterally into lower extremities. He rates his pain 8. His current exercise regime is  walking.  Also states a week ago he was in [place], [place] when he was walking and lost his balance and fell on his right hip, his friend helped him up he didn't seek medical attention. Also two weeks ago he fell in his bathroom and landed on his right side, he followed up with his PCP and had CXR performed. Educated on falls prevention he verbalizes understanding.  Pain Inventory Average Pain 8 Pain Right Now 8 My pain is sharp, burning, stabbing and aching  In the last 24 hours, has pain interfered with the following? General activity 8 Relation with others 8 Enjoyment of life 8 What TIME of day is your pain at its worst? morning, daytime, evening, night Sleep (in general) NA  Pain is worse with: bending, standing and some activites Pain improves with: rest and medication Relief from Meds: NA  Mobility walk with assistance use a walker  Function employed # of hrs/week 10-60  Neuro/Psych trouble walking anxiety  Prior Studies Any changes since last visit?  no  Physicians involved in your care Any changes since last visit?  no   Family History  Problem Relation Age of Onset  . Heart disease Father 78    Died of MI   Social History   Social History  . Marital Status: Married    Spouse Name: N/A  . Number of Children: N/A  . Years of Education: N/A   Social History Main Topics  . Smoking status: Current Some Day Smoker -- 1.50 packs/day for 29 years    Types: Cigarettes, E-cigarettes  . Smokeless tobacco: Never Used     Comment: PT STOPPED SMOKING CIGS 03/27/13. NOW CURRENTLY USING E-CIG and smoking  a few cigerettes per week.  . Alcohol Use: No  . Drug Use: Yes    Special: Marijuana     Comment: Urine showed THC  . Sexual Activity: Not Asked   Other Topics Concern  . None   Social History Narrative   Past Surgical History  Procedure Laterality Date  . Hernia repair    . Asd repair, sinus venosus    . Testicle surgery      testicular cancer surgery  . Cardiac defibrillator placement  08/2010  . Left heart catheterization with coronary angiogram N/A 06/14/2014    Procedure: LEFT HEART CATHETERIZATION WITH CORONARY ANGIOGRAM;  Surgeon: Jolaine Artist, MD;  Location[facility] CATH LAB;  Service: Cardiovascular;  Laterality: N/A;   Past Medical History  Diagnosis Date  . Chronic systolic heart failure (HCC)     EF 20-25%. s/p ST. Jude ICD  . CAD (coronary artery disease)     Last cath 2/12. 3-v CAD. Failed PCI of distal RCAc  CATH [facility] 4/13 with DES to  LAD X2  . Chronic back pain     lumbar stenosis  . Benign prostatic hypertrophy   . Depression   . History of testicular cancer   . Narcotic dependence (HCC)     chronic  . Benzodiazepine dependence (HCC)     chronic  . Anxiety   .  CHF (congestive heart failure) ([place])   . DJD (degenerative joint disease)   . Automatic implantable cardiac defibrillator [employer]     Analyze ST   BP 97/59 mmHg  Pulse 79  Resp 14  SpO2 93%  Opioid Risk Score:   Fall Risk Score:  `1  Depression screen PHQ 2/9  Depression screen [facility] 2/9 06/16/2015 06/13/2015 03/13/2015 02/14/2015 01/30/2015 12/04/2014 10/18/2014  Decreased Interest 0 0 1 1 0 0 1  Down, Depressed, Hopeless 0 0 0 0 0 0 0  PHQ - 2 Score 0 0 1 1 0 0 1  Altered sleeping - - - - - - 0  Tired, decreased energy - - - - - - 0  Change in appetite - - - - - - 0  Feeling bad or failure about yourself  - - - - - - 0  Trouble concentrating - - - - - - 0  Moving slowly or fidgety/restless - - - - - - 0  Suicidal thoughts - - - - - - 0  PHQ-9 Score - - - - - - 1     Review of Systems    Musculoskeletal: Positive for gait problem.  Psychiatric/Behavioral: The patient is nervous/anxious.   All other systems reviewed and are negative.      Objective:   Physical Exam  Constitutional: He is oriented to person, place, and time. He appears well-developed and well-nourished.  HENT:  Head: Normocephalic and atraumatic.  Neck: Normal range of motion. Neck supple.  Cardiovascular: Normal rate and regular rhythm.   Pulmonary/Chest: Effort normal and breath sounds normal.  Musculoskeletal:  Normal Muscle Bulk and Muscle Testing Reveals:  Upper Extremities: Full ROM and Muscle Strength 5/5 Lumbar Paraspinal Tenderness: L-3- L-5 Lower Extremities: Full ROM and Muscle Strength 5/5 Arises from chair slowly using walker for support. Antalgic gait  Neurological: He is alert and oriented to person, place, and time.  Skin: Skin is warm and dry.  Right Thigh with resolving ecchymosis noted  Psychiatric: He has a normal mood and affect.  Nursing note and vitals reviewed.         Assessment & Plan:  1. Lumbar spinal stenosis, severe.  Refilled: MS Contin 100 mg one tablet three times a day #90 and MSIR 30 mg one tablet every 6 hours as needed for sever pain #120.  2. History of shoulder pain left side. Controlled. No complaints today.  3. Sleep apnea : compliant with CPAP  4. Constipation: Continue Linzess  20 minutes of face to face patient care time was spent during this visit. All questions were encouraged and answered.   F/U in 1 month

## 2015-07-10 ENCOUNTER — Ambulatory Visit (HOSPITAL_COMMUNITY): Payer: BLUE CROSS/BLUE SHIELD

## 2015-07-10 LAB — PMP ALCOHOL METABOLITE (ETG): Ethyl Glucuronide (EtG): NEGATIVE ng/mL

## 2015-07-11 ENCOUNTER — Ambulatory Visit (HOSPITAL_COMMUNITY)
Admission: RE | Admit: 2015-07-11 | Discharge: 2015-07-11 | Disposition: A | Discharge status: Home / Self Care | Payer: BLUE CROSS/BLUE SHIELD | Source: Ambulatory Visit | Attending: Sports Medicine | Admitting: Sports Medicine

## 2015-07-11 ENCOUNTER — Other Ambulatory Visit (HOSPITAL_COMMUNITY): Payer: Self-pay | Admitting: Sports Medicine

## 2015-07-11 DIAGNOSIS — M7989 Other specified soft tissue disorders: Secondary | ICD-10-CM | POA: Diagnosis not present

## 2015-07-11 DIAGNOSIS — M79604 Pain in right leg: Secondary | ICD-10-CM | POA: Diagnosis not present

## 2015-07-11 DIAGNOSIS — I82401 Acute embolism and thrombosis of unspecified deep veins of right lower extremity: Secondary | ICD-10-CM | POA: Diagnosis not present

## 2015-07-11 NOTE — Progress Notes (Addendum)
*  PRELIMINARY RESULTS* Vascular Ultrasound Right lower extremity venous duplex has been completed.  Preliminary findings: No evidence of DVT or baker's cyst.   Attempted call report to number given. Office is closed. Call made to on call doctor at 5:20pm. Waiting on call back for instructions for patient. 5:24pm Dr. Wyline Copas returned call. Results given.    Landry Mellow, RDMS, RVT  07/11/2015, 5:18 PM

## 2015-07-12 ENCOUNTER — Ambulatory Visit (HOSPITAL_COMMUNITY): Payer: BLUE CROSS/BLUE SHIELD

## 2015-07-12 LAB — PRESCRIPTION MONITORING PROFILE (SOLSTAS)
Amphetamine/Meth: NEGATIVE ng/mL
Barbiturate Screen, Urine: NEGATIVE ng/mL
Buprenorphine, Urine: NEGATIVE ng/mL
Cannabinoid Scrn, Ur: NEGATIVE ng/mL
Carisoprodol, Urine: NEGATIVE ng/mL
Cocaine Metabolites: NEGATIVE ng/mL
Creatinine, Urine: 209.18 mg/dL (ref >20.0)
Fentanyl, Ur: NEGATIVE ng/mL
MDMA URINE: NEGATIVE ng/mL
Meperidine, Ur: NEGATIVE ng/mL
Methadone Screen, Urine: NEGATIVE ng/mL
Nitrites, Initial: NEGATIVE ug/mL
Oxycodone Screen, Ur: NEGATIVE ng/mL
Propoxyphene: NEGATIVE ng/mL
Tapentadol, urine: NEGATIVE ng/mL
Tramadol Scrn, Ur: NEGATIVE ng/mL
Zolpidem, Urine: NEGATIVE ng/mL
pH, Initial: 7.3 pH (ref 4.5–8.9)

## 2015-07-12 LAB — BENZODIAZEPINES (GC/LC/MS), URINE
Alprazolam metabolite (GC/LC/MS), ur confirm: NEGATIVE ng/mL (ref <25)
Clonazepam metabolite (GC/LC/MS), ur confirm: NEGATIVE ng/mL (ref <25)
Flurazepam metabolite (GC/LC/MS), ur confirm: NEGATIVE ng/mL (ref <50)
Lorazepam (GC/LC/MS), ur confirm: 965 ng/mL — AB (ref <50)
Midazolam (GC/LC/MS), ur confirm: NEGATIVE ng/mL (ref <50)
Nordiazepam (GC/LC/MS), ur confirm: NEGATIVE ng/mL (ref <50)
Oxazepam (GC/LC/MS), ur confirm: NEGATIVE ng/mL (ref <50)
Temazepam (GC/LC/MS), ur confirm: NEGATIVE ng/mL (ref <50)
Triazolam metabolite (GC/LC/MS), ur confirm: NEGATIVE ng/mL (ref <50)

## 2015-07-12 LAB — OPIATES/OPIOIDS (LC/MS-MS)
Codeine Urine: NEGATIVE ng/mL (ref <50)
Hydrocodone: NEGATIVE ng/mL (ref <50)
Hydromorphone: NEGATIVE ng/mL — AB (ref <50)
Morphine Urine: >50000 ng/mL (ref <50)
Norhydrocodone, Ur: NEGATIVE ng/mL (ref <50)
Noroxycodone, Ur: NEGATIVE ng/mL (ref <50)
Oxycodone, ur: NEGATIVE ng/mL (ref <50)
Oxymorphone: NEGATIVE ng/mL (ref <50)

## 2015-07-14 NOTE — Telephone Encounter (Signed)
ERROR

## 2015-07-17 ENCOUNTER — Ambulatory Visit (HOSPITAL_COMMUNITY): Payer: BLUE CROSS/BLUE SHIELD

## 2015-07-19 ENCOUNTER — Ambulatory Visit (HOSPITAL_COMMUNITY): Payer: BLUE CROSS/BLUE SHIELD

## 2015-07-22 ENCOUNTER — Ambulatory Visit (HOSPITAL_COMMUNITY): Payer: BLUE CROSS/BLUE SHIELD

## 2015-07-24 ENCOUNTER — Ambulatory Visit (HOSPITAL_COMMUNITY): Payer: BLUE CROSS/BLUE SHIELD

## 2015-07-26 ENCOUNTER — Ambulatory Visit (HOSPITAL_COMMUNITY): Payer: BLUE CROSS/BLUE SHIELD

## 2015-07-29 ENCOUNTER — Other Ambulatory Visit (HOSPITAL_COMMUNITY): Payer: Self-pay | Admitting: *Deleted

## 2015-07-29 ENCOUNTER — Ambulatory Visit (HOSPITAL_COMMUNITY): Payer: BLUE CROSS/BLUE SHIELD

## 2015-07-31 ENCOUNTER — Ambulatory Visit (HOSPITAL_COMMUNITY): Payer: BLUE CROSS/BLUE SHIELD

## 2015-08-02 ENCOUNTER — Encounter: Payer: Self-pay | Admitting: Family Medicine

## 2015-08-02 ENCOUNTER — Ambulatory Visit (HOSPITAL_COMMUNITY): Payer: BLUE CROSS/BLUE SHIELD

## 2015-08-05 ENCOUNTER — Ambulatory Visit (HOSPITAL_COMMUNITY): Payer: BLUE CROSS/BLUE SHIELD

## 2015-08-06 ENCOUNTER — Encounter: Payer: Self-pay | Admitting: Registered Nurse

## 2015-08-06 ENCOUNTER — Encounter: Payer: BLUE CROSS/BLUE SHIELD | Attending: Physical Medicine & Rehabilitation | Admitting: Registered Nurse

## 2015-08-06 VITALS — BP 130/80 | HR 70 | Resp 14

## 2015-08-06 DIAGNOSIS — Z5181 Encounter for therapeutic drug level monitoring: Secondary | ICD-10-CM

## 2015-08-06 DIAGNOSIS — Z79899 Other long term (current) drug therapy: Secondary | ICD-10-CM

## 2015-08-06 DIAGNOSIS — R5381 Other malaise: Secondary | ICD-10-CM | POA: Diagnosis present

## 2015-08-06 DIAGNOSIS — M47817 Spondylosis without myelopathy or radiculopathy, lumbosacral region: Secondary | ICD-10-CM | POA: Diagnosis not present

## 2015-08-06 DIAGNOSIS — G894 Chronic pain syndrome: Secondary | ICD-10-CM | POA: Diagnosis not present

## 2015-08-06 DIAGNOSIS — F1120 Opioid dependence, uncomplicated: Secondary | ICD-10-CM | POA: Diagnosis not present

## 2015-08-06 MED ORDER — MORPHINE SULFATE 30 MG PO TABS: 30.0000 mg | ORAL_TABLET | Freq: Four times a day (QID) | ORAL | Status: DC | PRN

## 2015-08-06 MED ORDER — MORPHINE SULFATE ER 100 MG PO TBCR: 100.0000 mg | EXTENDED_RELEASE_TABLET | Freq: Three times a day (TID) | ORAL | Status: DC

## 2015-08-06 NOTE — Progress Notes (Signed)
Subjective:    Patient ID: [name], male    DOB: Aug 18, 1960, 55 y.o.   MRN: [id number]  HPI: [name] is a 55 year old male who returns for follow up for chronic pain and medication refill. He says his pain is located in his lower back radiating posteriorly into right  lower extremity. He rates his pain 8. His current exercise regime is walking.   Pain Inventory Average Pain 8 Pain Right Now 8 My pain is sharp, burning and aching  In the last 24 hours, has pain interfered with the following? General activity 3 Relation with others 7 Enjoyment of life 7 What TIME of day is your pain at its worst? morning, daytime, night Sleep (in general) Fair  Pain is worse with: walking, bending and standing Pain improves with: medication Relief from Meds: 4  Mobility walk with assistance use a walker ability to climb steps?  yes Do you have any goals in this area?  yes  Function employed # of hrs/week .  Neuro/Psych weakness numbness tingling trouble walking anxiety  Prior Studies Any changes since last visit?  no  Physicians involved in your care Any changes since last visit?  no   Family History  Problem Relation Age of Onset  . Heart disease Father 42    Died of MI   Social History   Social History  . Marital Status: Married    Spouse Name: N/A  . Number of Children: N/A  . Years of Education: N/A   Social History Main Topics  . Smoking status: Current Some Day Smoker -- 1.50 packs/day for 29 years    Types: Cigarettes, E-cigarettes  . Smokeless tobacco: Never Used     Comment: PT STOPPED SMOKING CIGS 03/27/13. NOW CURRENTLY USING E-CIG and smoking a few cigerettes per week.  . Alcohol Use: No  . Drug Use: Yes    Special: Marijuana     Comment: Urine showed THC  . Sexual Activity: Not Asked   Other Topics Concern  . None   Social History Narrative   Past Surgical History  Procedure Laterality Date  . Hernia repair    . Asd repair,  sinus venosus    . Testicle surgery      testicular cancer surgery  . Cardiac defibrillator placement  08/2010  . Left heart catheterization with coronary angiogram N/A 06/14/2014    Procedure: LEFT HEART CATHETERIZATION WITH CORONARY ANGIOGRAM;  Surgeon: Jolaine Artist, MD;  Location: [facility] CATH LAB;  Service: Cardiovascular;  Laterality: N/A;   Past Medical History  Diagnosis Date  . Chronic systolic heart failure (HCC)     EF 20-25%. s/p ST. Jude ICD  . CAD (coronary artery disease)     Last cath 2/12. 3-v CAD. Failed PCI of distal RCAc  CATH [facility] 4/13 with DES to  LAD X2  . Chronic back pain     lumbar stenosis  . Benign prostatic hypertrophy   . Depression   . History of testicular cancer   . Narcotic dependence (HCC)     chronic  . Benzodiazepine dependence (HCC)     chronic  . Anxiety   . CHF (congestive heart failure) ([place])   . DJD (degenerative joint disease)   . Automatic implantable cardiac defibrillator [employer]     Analyze ST   BP 130/80 mmHg  Pulse 70  Resp 14  SpO2 97%  Opioid Risk Score:   Fall Risk Score:  `1  Depression screen PHQ 2/9  Depression screen [facility] 2/9 06/16/2015 06/13/2015 03/13/2015 02/14/2015 01/30/2015 12/04/2014 10/18/2014  Decreased Interest 0 0 1 1 0 0 1  Down, Depressed, Hopeless 0 0 0 0 0 0 0  PHQ - 2 Score 0 0 1 1 0 0 1  Altered sleeping - - - - - - 0  Tired, decreased energy - - - - - - 0  Change in appetite - - - - - - 0  Feeling bad or failure about yourself  - - - - - - 0  Trouble concentrating - - - - - - 0  Moving slowly or fidgety/restless - - - - - - 0  Suicidal thoughts - - - - - - 0  PHQ-9 Score - - - - - - 1     Review of Systems     Objective:   Physical Exam  Constitutional: He is oriented to person, place, and time. He appears well-developed and well-nourished.  HENT:  Head: Normocephalic and atraumatic.  Neck: Normal range of motion. Neck supple.  Cardiovascular: Normal rate and regular rhythm.   Pulmonary/Chest:  Effort normal and breath sounds normal.  Musculoskeletal:  Normal Muscle Bulk and Muscle Testing Reveals: Upper Extremities: Full ROM and Muscle Strength 5/5 Left AC Joint Tenderness Lumbar Paraspinal Tenderness: L-3- L-5 Lower Extremities: Full ROM and Muscle Strength 5/5 Arises from chair slowly using walker for support Narrow Based Gait  Neurological: He is alert and oriented to person, place, and time.  Skin: Skin is warm and dry.  Psychiatric: He has a normal mood and affect.  Nursing note and vitals reviewed.         Assessment & Plan:  1. Lumbar spinal stenosis, severe.  Refilled: MS Contin 100 mg one tablet three times a day #90 and MSIR 30 mg one tablet every 6 hours as needed for sever pain #120.  2. History of shoulder pain left side. Controlled. No complaints today.  3. Sleep apnea : compliant with CPAP  4. Constipation: Continue Linzess  20 minutes of face to face patient care time was spent during this visit. All questions were encouraged and answered.   F/U in 1 month

## 2015-08-07 ENCOUNTER — Ambulatory Visit (HOSPITAL_COMMUNITY): Payer: BLUE CROSS/BLUE SHIELD

## 2015-08-07 ENCOUNTER — Encounter: Payer: Self-pay | Admitting: Family Medicine

## 2015-08-09 ENCOUNTER — Ambulatory Visit (HOSPITAL_COMMUNITY): Payer: BLUE CROSS/BLUE SHIELD

## 2015-09-03 ENCOUNTER — Encounter: Payer: BLUE CROSS/BLUE SHIELD | Attending: Physical Medicine & Rehabilitation | Admitting: Registered Nurse

## 2015-09-03 ENCOUNTER — Other Ambulatory Visit: Payer: Self-pay | Admitting: Internal Medicine

## 2015-09-03 ENCOUNTER — Encounter: Payer: Self-pay | Admitting: Registered Nurse

## 2015-09-03 VITALS — BP 137/87 | HR 97

## 2015-09-03 DIAGNOSIS — G894 Chronic pain syndrome: Secondary | ICD-10-CM | POA: Diagnosis not present

## 2015-09-03 DIAGNOSIS — Z79899 Other long term (current) drug therapy: Secondary | ICD-10-CM

## 2015-09-03 DIAGNOSIS — F1120 Opioid dependence, uncomplicated: Secondary | ICD-10-CM

## 2015-09-03 DIAGNOSIS — M25551 Pain in right hip: Secondary | ICD-10-CM

## 2015-09-03 DIAGNOSIS — R5381 Other malaise: Secondary | ICD-10-CM | POA: Diagnosis present

## 2015-09-03 DIAGNOSIS — M47817 Spondylosis without myelopathy or radiculopathy, lumbosacral region: Secondary | ICD-10-CM | POA: Diagnosis not present

## 2015-09-03 DIAGNOSIS — Z5181 Encounter for therapeutic drug level monitoring: Secondary | ICD-10-CM

## 2015-09-03 MED ORDER — MORPHINE SULFATE ER 100 MG PO TBCR: 100.0000 mg | EXTENDED_RELEASE_TABLET | Freq: Three times a day (TID) | ORAL | Status: DC

## 2015-09-03 MED ORDER — MORPHINE SULFATE 30 MG PO TABS: 30.0000 mg | ORAL_TABLET | Freq: Four times a day (QID) | ORAL | Status: DC | PRN

## 2015-09-03 NOTE — Progress Notes (Signed)
Subjective:    Patient ID: [name], male    DOB: 1960/08/31, 55 y.o.   MRN: [id number]  HPI: [name] is a 55 year old male who returns for follow up for chronic pain and medication refill. He says his pain is located in his lower back, right hip and rightlower extremity. He rates his pain 8. His current exercise regime is walking in his home with walker.  Pain Inventory Average Pain 8 Pain Right Now 8 My pain is sharp, burning and aching  In the last 24 hours, has pain interfered with the following? General activity . Relation with others na Enjoyment of life na What TIME of day is your pain at its worst? morning daytime evening Sleep (in general) Poor  Pain is worse with: na Pain improves with: na Relief from Meds: na  Mobility walk with assistance use a walker ability to climb steps?  yes do you drive?  yes Do you have any goals in this area?  yes  Function employed # of hrs/week NA  Neuro/Psych numbness spasms  Prior Studies Any changes since last visit?  no  Physicians involved in your care Any changes since last visit?  no   Family History  Problem Relation Age of Onset  . Heart disease Father 39    Died of MI   Social History   Social History  . Marital Status: Married    Spouse Name: N/A  . Number of Children: N/A  . Years of Education: N/A   Social History Main Topics  . Smoking status: Current Some Day Smoker -- 1.50 packs/day for 29 years    Types: Cigarettes, E-cigarettes  . Smokeless tobacco: Never Used     Comment: PT STOPPED SMOKING CIGS 03/27/13. NOW CURRENTLY USING E-CIG and smoking a few cigerettes per week.  . Alcohol Use: No  . Drug Use: Yes    Special: Marijuana     Comment: Urine showed THC  . Sexual Activity: Not Asked   Other Topics Concern  . None   Social History Narrative   Past Surgical History  Procedure Laterality Date  . Hernia repair    . Asd repair, sinus venosus    . Testicle surgery      testicular cancer surgery  . Cardiac defibrillator placement  08/2010  . Left heart catheterization with coronary angiogram N/A 06/14/2014    Procedure: LEFT HEART CATHETERIZATION WITH CORONARY ANGIOGRAM;  Surgeon: Jolaine Artist, MD;  Location: [facility] CATH LAB;  Service: Cardiovascular;  Laterality: N/A;   Past Medical History  Diagnosis Date  . Chronic systolic heart failure (HCC)     EF 20-25%. s/p ST. Jude ICD  . CAD (coronary artery disease)     Last cath 2/12. 3-v CAD. Failed PCI of distal RCAc  CATH [facility] 4/13 with DES to  LAD X2  . Chronic back pain     lumbar stenosis  . Benign prostatic hypertrophy   . Depression   . History of testicular cancer   . Narcotic dependence (HCC)     chronic  . Benzodiazepine dependence (HCC)     chronic  . Anxiety   . CHF (congestive heart failure) ([place])   . DJD (degenerative joint disease)   . Automatic implantable cardiac defibrillator [employer]     Analyze ST   BP 137/87 mmHg  Pulse 97  SpO2 97%  Opioid Risk Score:   Fall Risk Score:  `1  Depression screen [facility] 2/9  Depression screen [facility] 2/9 06/16/2015 06/13/2015 03/13/2015 02/14/2015 01/30/2015 12/04/2014 10/18/2014  Decreased Interest 0 0 1 1 0 0 1  Down, Depressed, Hopeless 0 0 0 0 0 0 0  PHQ - 2 Score 0 0 1 1 0 0 1  Altered sleeping - - - - - - 0  Tired, decreased energy - - - - - - 0  Change in appetite - - - - - - 0  Feeling bad or failure about yourself  - - - - - - 0  Trouble concentrating - - - - - - 0  Moving slowly or fidgety/restless - - - - - - 0  Suicidal thoughts - - - - - - 0  PHQ-9 Score - - - - - - 1     Review of Systems     Objective:   Physical Exam  Constitutional: He is oriented to person, place, and time. He appears well-developed and well-nourished.  HENT:  Head: Normocephalic.  Neck: Normal range of motion. Neck supple.  Cardiovascular: Normal rate and regular rhythm.   Pulmonary/Chest: Effort normal and breath sounds normal.  Musculoskeletal:    Normal Muscle Bulk and Muscle Testing Reveals: Upper Extremities: Full ROM and Muscle Strength 5/5 Lumbar Paraspinal Tenderness: L-3- L-5 No Greater Trochanteric Tenderness Lower Extremities: Full ROM and Muscle Strength 5/5 Arises from chair slowly using walker for support Narrow Based gait  Neurological: He is alert and oriented to person, place, and time.  Skin: Skin is warm and dry.  Psychiatric: He has a normal mood and affect.  Nursing note and vitals reviewed.         Assessment & Plan:  1. Lumbar spinal stenosis, severe.  Refilled: MS Contin 100 mg one tablet three times a day #90 and MSIR 30 mg one tablet every 6 hours as needed for sever pain #120.  2. History of shoulder pain left side. Controlled. No complaints today.  3. Sleep apnea : compliant with CPAP  4. Constipation: Continue Linzess  20 minutes of face to face patient care time was spent during this visit. All questions were encouraged and answered.   F/U in 1 month

## 2015-09-11 ENCOUNTER — Telehealth: Payer: Self-pay

## 2015-09-11 ENCOUNTER — Encounter: Payer: Self-pay | Admitting: *Deleted

## 2015-09-11 NOTE — Telephone Encounter (Signed)
Pt is throwing up and he's too sick to come into the office, wants to know if they can send him a prescription into the pharmacy. [facility]. Please call patient back to let him know if this can be done or not.

## 2015-09-12 NOTE — Telephone Encounter (Signed)
Pt needs to RTC. We have never prescribed zofran for him.

## 2015-09-12 NOTE — Telephone Encounter (Signed)
Pt called 3/1 with N&V, too sick to come to clinic.    IC pt today 3/2 - continues with N&V, starts with burping "bowel smelling" burps... has had prior episodes and usually lasts 2-3 days.  He usually gets Zofran.  Asked if rx could be called to [facility].

## 2015-09-13 ENCOUNTER — Other Ambulatory Visit (HOSPITAL_COMMUNITY): Payer: Self-pay | Admitting: *Deleted

## 2015-09-13 MED ORDER — CLOPIDOGREL BISULFATE 75 MG PO TABS: ORAL_TABLET | ORAL | Status: DC

## 2015-09-14 NOTE — Telephone Encounter (Signed)
Called patient to advise him to come in to clinic.

## 2015-09-20 ENCOUNTER — Ambulatory Visit (HOSPITAL_COMMUNITY)
Admission: RE | Admit: 2015-09-20 | Payer: BLUE CROSS/BLUE SHIELD | Source: Ambulatory Visit | Admitting: Internal Medicine

## 2015-10-01 ENCOUNTER — Encounter: Payer: BLUE CROSS/BLUE SHIELD | Attending: Physical Medicine & Rehabilitation | Admitting: Registered Nurse

## 2015-10-01 ENCOUNTER — Encounter: Payer: Self-pay | Admitting: Registered Nurse

## 2015-10-01 VITALS — BP 124/74 | HR 69

## 2015-10-01 DIAGNOSIS — M79604 Pain in right leg: Secondary | ICD-10-CM

## 2015-10-01 DIAGNOSIS — M25551 Pain in right hip: Secondary | ICD-10-CM | POA: Diagnosis not present

## 2015-10-01 DIAGNOSIS — F1120 Opioid dependence, uncomplicated: Secondary | ICD-10-CM

## 2015-10-01 DIAGNOSIS — Z79899 Other long term (current) drug therapy: Secondary | ICD-10-CM

## 2015-10-01 DIAGNOSIS — M47817 Spondylosis without myelopathy or radiculopathy, lumbosacral region: Secondary | ICD-10-CM | POA: Diagnosis not present

## 2015-10-01 DIAGNOSIS — G894 Chronic pain syndrome: Secondary | ICD-10-CM

## 2015-10-01 DIAGNOSIS — R5381 Other malaise: Secondary | ICD-10-CM | POA: Insufficient documentation

## 2015-10-01 DIAGNOSIS — R413 Other amnesia: Secondary | ICD-10-CM

## 2015-10-01 DIAGNOSIS — Z5181 Encounter for therapeutic drug level monitoring: Secondary | ICD-10-CM

## 2015-10-01 MED ORDER — MORPHINE SULFATE ER 100 MG PO TBCR: 100.0000 mg | EXTENDED_RELEASE_TABLET | Freq: Three times a day (TID) | ORAL | Status: DC

## 2015-10-01 MED ORDER — MORPHINE SULFATE 30 MG PO TABS: 30.0000 mg | ORAL_TABLET | Freq: Four times a day (QID) | ORAL | Status: DC | PRN

## 2015-10-01 NOTE — Progress Notes (Signed)
Subjective:    Patient ID: [name], male    DOB: April 02, 1961, 55 y.o.   MRN: [id number]  HPI: [name] is a 55 year old male who returns for follow up for chronic pain and medication refill. He states his pain is located in his lower back radiating into his lower extremities posteriorly, right hip and rightlower extremity. He rates his pain 8. His current exercise regime is walking in his home with walker. Also states he has noticed memory changes neurology referral placed. He is alert and oriented x3.   Pain Inventory Average Pain 8 Pain Right Now 8 My pain is sharp, burning, stabbing and aching  In the last 24 hours, has pain interfered with the following? General activity 3 Relation with others 5 Enjoyment of life 5 What TIME of day is your pain at its worst? morning daytime evening Sleep (in general) Fair  Pain is worse with: walking, bending, standing and some activites Pain improves with: medication Relief from Meds: 5  Mobility use a walker how many minutes can you walk? 5 ability to climb steps?  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  no  Physicians involved in your care Any changes since last visit?  no   Family History  Problem Relation Age of Onset  . Heart disease Father 12    Died of MI   Social History   Social History  . Marital Status: Married    Spouse Name: N/A  . Number of Children: N/A  . Years of Education: N/A   Social History Main Topics  . Smoking status: Current Some Day Smoker -- 1.50 packs/day for 29 years    Types: Cigarettes, E-cigarettes  . Smokeless tobacco: Never Used     Comment: PT STOPPED SMOKING CIGS 03/27/13. NOW CURRENTLY USING E-CIG and smoking a few cigerettes per week.  . Alcohol Use: No  . Drug Use: Yes    Special: Marijuana     Comment:  Urine showed THC  . Sexual Activity: Not Asked   Other Topics Concern  . None   Social History Narrative   Past Surgical History  Procedure Laterality Date  . Hernia repair    . Asd repair, sinus venosus    . Testicle surgery      testicular cancer surgery  . Cardiac defibrillator placement  08/2010  . Left heart catheterization with coronary angiogram N/A 06/14/2014    Procedure: LEFT HEART CATHETERIZATION WITH CORONARY ANGIOGRAM;  Surgeon: Jolaine Artist, MD;  Location: [facility] CATH LAB;  Service: Cardiovascular;  Laterality: N/A;   Past Medical History  Diagnosis Date  . Chronic systolic heart failure (HCC)     EF 20-25%. s/p ST. Jude ICD  . CAD (coronary artery disease)     Last cath 2/12. 3-v CAD. Failed PCI of distal RCAc  CATH [facility] 4/13 with DES to  LAD X2  . Chronic back pain     lumbar stenosis  . Benign prostatic hypertrophy   . Depression   . History of testicular cancer   . Narcotic dependence (HCC)     chronic  . Benzodiazepine dependence (HCC)     chronic  . Anxiety   . CHF (congestive heart failure) ([place])   . DJD (degenerative joint disease)   . Automatic implantable cardiac defibrillator St Judes  Analyze ST   BP 124/74 mmHg  Pulse 69  SpO2 97%  Opioid Risk Score:   Fall Risk Score:  `1  Depression screen PHQ 2/9  Depression screen [facility] 2/9 10/01/2015 06/16/2015 06/13/2015 03/13/2015 02/14/2015 01/30/2015 12/04/2014  Decreased Interest 1 0 0 1 1 0 0  Down, Depressed, Hopeless 0 0 0 0 0 0 0  PHQ - 2 Score 1 0 0 1 1 0 0  Altered sleeping - - - - - - -  Tired, decreased energy - - - - - - -  Change in appetite - - - - - - -  Feeling bad or failure about yourself  - - - - - - -  Trouble concentrating - - - - - - -  Moving slowly or fidgety/restless - - - - - - -  Suicidal thoughts - - - - - - -  PHQ-9 Score - - - - - - -    Review of Systems  All other systems reviewed and are negative.      Objective:   Physical Exam  Constitutional: He is  oriented to person, place, and time. He appears well-developed and well-nourished.  HENT:  Head: Normocephalic and atraumatic.  Neck: Normal range of motion. Neck supple.  Cardiovascular: Normal rate and regular rhythm.   Pulmonary/Chest: Effort normal and breath sounds normal.  Musculoskeletal:  Normal Muscle Bulk and Muscle Testing Reveals: Upper Extremities: Full ROM and Muscle Strength 5/5 Lumbar Paraspinal Tenderness: L-3- L-5 Right Greater Trochanteric tenderness Lower Extremities: Full ROM and Muscle Strength 5/5 Arises from chair slowly using walker for support Narrow Based Gait  Neurological: He is alert and oriented to person, place, and time.  Skin: Skin is warm and dry.  Psychiatric: He has a normal mood and affect.  Nursing note and vitals reviewed.         Assessment & Plan:  1. Lumbar spinal stenosis, severe.  Refilled: MS Contin 100 mg one tablet three times a day #90 and MSIR 30 mg one tablet every 6 hours as needed for sever pain #120.  2. History of shoulder pain left side. Controlled. No complaints today.  3. Sleep apnea : compliant with CPAP  4. Constipation: Continue Linzess 5. Right Hip Pain: RX: X-ray 6. Right Leg Pain: RX: X-ray 7. Memory Changes: RX: Referral to Neurology  20 minutes of face to face patient care time was spent during this visit. All questions were encouraged and answered.   F/U in 1 month

## 2015-10-07 ENCOUNTER — Encounter (HOSPITAL_COMMUNITY): Payer: Self-pay | Admitting: Internal Medicine

## 2015-10-07 ENCOUNTER — Ambulatory Visit (HOSPITAL_COMMUNITY)
Admission: RE | Admit: 2015-10-07 | Discharge: 2015-10-07 | Disposition: A | Discharge status: Home / Self Care | Payer: BLUE CROSS/BLUE SHIELD | Source: Ambulatory Visit | Attending: Internal Medicine | Admitting: Internal Medicine

## 2015-10-07 VITALS — BP 114/74 | HR 78 | Wt 209.0 lb

## 2015-10-07 DIAGNOSIS — Z7982 Long term (current) use of aspirin: Secondary | ICD-10-CM | POA: Insufficient documentation

## 2015-10-07 DIAGNOSIS — I2510 Atherosclerotic heart disease of native coronary artery without angina pectoris: Secondary | ICD-10-CM | POA: Diagnosis not present

## 2015-10-07 DIAGNOSIS — E669 Obesity, unspecified: Secondary | ICD-10-CM | POA: Insufficient documentation

## 2015-10-07 DIAGNOSIS — I25118 Atherosclerotic heart disease of native coronary artery with other forms of angina pectoris: Secondary | ICD-10-CM

## 2015-10-07 DIAGNOSIS — R9431 Abnormal electrocardiogram [ECG] [EKG]: Secondary | ICD-10-CM | POA: Insufficient documentation

## 2015-10-07 DIAGNOSIS — I447 Left bundle-branch block, unspecified: Secondary | ICD-10-CM | POA: Diagnosis not present

## 2015-10-07 DIAGNOSIS — Z79899 Other long term (current) drug therapy: Secondary | ICD-10-CM | POA: Insufficient documentation

## 2015-10-07 DIAGNOSIS — I5022 Chronic systolic (congestive) heart failure: Secondary | ICD-10-CM | POA: Diagnosis not present

## 2015-10-07 DIAGNOSIS — I255 Ischemic cardiomyopathy: Secondary | ICD-10-CM | POA: Diagnosis not present

## 2015-10-07 DIAGNOSIS — I5042 Chronic combined systolic (congestive) and diastolic (congestive) heart failure: Secondary | ICD-10-CM

## 2015-10-07 DIAGNOSIS — R42 Dizziness and giddiness: Secondary | ICD-10-CM | POA: Diagnosis not present

## 2015-10-07 DIAGNOSIS — R0602 Shortness of breath: Secondary | ICD-10-CM | POA: Diagnosis present

## 2015-10-07 DIAGNOSIS — R0600 Dyspnea, unspecified: Secondary | ICD-10-CM | POA: Insufficient documentation

## 2015-10-07 DIAGNOSIS — F329 Major depressive disorder, single episode, unspecified: Secondary | ICD-10-CM | POA: Insufficient documentation

## 2015-10-07 DIAGNOSIS — G4733 Obstructive sleep apnea (adult) (pediatric): Secondary | ICD-10-CM | POA: Insufficient documentation

## 2015-10-07 DIAGNOSIS — Z95810 Presence of automatic (implantable) cardiac defibrillator: Secondary | ICD-10-CM | POA: Insufficient documentation

## 2015-10-07 MED ORDER — LISINOPRIL 2.5 MG PO TABS
2.5000 mg | ORAL_TABLET | Freq: Every day | ORAL | Status: DC
Start: 2015-10-07 — End: 2016-05-13

## 2015-10-07 MED ORDER — CARVEDILOL 25 MG PO TABS
25.0000 mg | ORAL_TABLET | Freq: Two times a day (BID) | ORAL | Status: DC
Start: 2015-10-07 — End: 2016-11-26

## 2015-10-07 MED ORDER — ROSUVASTATIN CALCIUM 40 MG PO TABS: 40.0000 mg | ORAL_TABLET | Freq: Every day | ORAL | Status: DC

## 2015-10-07 MED ORDER — SILDENAFIL CITRATE 100 MG PO TABS: ORAL_TABLET | ORAL | Status: DC

## 2015-10-07 MED ORDER — EZETIMIBE 10 MG PO TABS: ORAL_TABLET | ORAL | Status: DC

## 2015-10-07 MED ORDER — SPIRONOLACTONE 25 MG PO TABS: ORAL_TABLET | ORAL | Status: DC

## 2015-10-07 MED ORDER — TORSEMIDE 20 MG PO TABS: 20.0000 mg | ORAL_TABLET | Freq: Every day | ORAL | Status: DC | PRN

## 2015-10-07 MED ORDER — CLOPIDOGREL BISULFATE 75 MG PO TABS: ORAL_TABLET | ORAL | Status: DC

## 2015-10-07 NOTE — Patient Instructions (Signed)
Chest X-ray today  Your physician has requested that you have a carotid duplex. This test is an ultrasound of the carotid arteries in your neck. It looks at blood flow through these arteries that supply the brain with blood. Allow one hour for this exam. There are no restrictions or special instructions.  We will contact you in 6 months to schedule your next appointment.

## 2015-10-07 NOTE — Progress Notes (Signed)
Patient ID: [name], male   DOB: [birth year], 55 y.o.   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  Had cath at [facility] 3/15 with stable CAD. Treated medically. Last cath 06/2014 showed:  Left main: Normal LAD coursed to the apex, gave off a moderate-sized diagonal branch in the midsection. There was mild plaque throughout the proximal LAD. In the mid LAD there was a widely patent stent. Just after the stent there was a 40-50% stenosis. There was mild plaque in the diagonal. Left circumflex: gave off a ramus branch, small OM-1. The AV groove circumflex was totally occluded in the midsection which was chronic. In the ramus branch, there was evidence of a previously placed stent which is chronically subtotally occluded with faint flow in the distal vessel. In the OM-1, there was a 20% proximal lesion. The OM-1 gave collaterals to a small OM-2 Right coronary artery:was a large dominant vessel, had diffuse 40% disease throughout the proximal and midsection.In the distal RCA just after the takeoff of the PDA, there was a chronic 99% lesion with a near subtotal occlusion.There were left to right collaterals filling the distal RCA. (Previously failed PCI of distal RCA. Anatomy not favorable for CABG.) LV-gram done in the RAO projection: Ejection fraction = 20-25% with akinesis of the inferior wall and global HK elsewhere.   He is s/p St. Jude  ICD implantation. Was enrolled in Analyze ST.   Most recent Echo 11/13; EF 30-35% mild RV dilation.  Has been followed recently by Dr. Stann Mainland at [facility]. Underwent cath at [facility] in 3/15 and had 2.5x62m Xience DES placed in LAD. Had cath 12/15 here with  stable CAD. He stopped his lisinopril several weeks ago because he said his BP was low. He saw Dr. RStann Mainland2 weeks ago and started back on lisinopril 2.5 daily. Says he still feels tired. Has occasional dizziness. Thinks he had TIAs several days with dizziness ago but did not go to ER. Remains on high-dose MS-Contin for back and hip pain. Has a lot of stress in his life due to his wife being an alcoholic. Occasional CP at any time. Takes NTG as needed. No change. Feels congested. Wife has walking PNA. No fevers or chills.   [facility] and Dr. AAnnabelle Harmanin hematology for polycythemia. Found to hypoxic and have OSA.   ROS: All systems negative except as listed in HPI, PMH and Problem List.  Past Medical History  Diagnosis Date  . Chronic systolic heart failure (HCC)     EF 20-25%. s/p ST. Jude ICD  . CAD (coronary artery disease)     Last cath 2/12. 3-v CAD. Failed PCI of distal RCAc  CATH [facility] 4/13 with DES to  LAD X2  . Chronic back pain     lumbar stenosis  . Benign prostatic hypertrophy   . Depression   . History of testicular cancer   . Narcotic dependence (HCC)     chronic  . Benzodiazepine dependence (HCC)     chronic  . Anxiety   . CHF [place] heart failure) [place]   . DJD (degenerative joint disease)   . Automatic implantable cardiac defibrillator St Judes  Analyze ST    Current Outpatient Prescriptions  Medication Sig Dispense Refill  . aspirin 325 MG tablet Take 325 mg by mouth daily.    . carvedilol (COREG) 25 MG tablet Take 25 mg by mouth 2 (two) times daily.     . clopidogrel (PLAVIX) 75 MG tablet TAKE 1 TABLET EACH DAY. 30 tablet 0  . diazepam (VALIUM) 10 MG tablet Take 10 mg by mouth 2 (two) times daily.     Marland Kitchen lactulose (CHRONULAC) 10 GM/15ML solution Take by mouth. Reported on 10/07/2015    . lisinopril (PRINIVIL,ZESTRIL) 2.5 MG tablet Take 1 tablet by mouth at bedtime.    Marland Kitchen LORazepam (ATIVAN) 1 MG tablet Take 1 mg by mouth 4  (four) times daily as needed. For anxiety    . morphine (MS CONTIN) 100 MG 12 hr tablet Take 1 tablet (100 mg total) by mouth 3 (three) times daily. 90 tablet 0  . morphine (MSIR) 30 MG tablet Take 1 tablet (30 mg total) by mouth every 6 (six) hours as needed for severe pain. 120 tablet 0  . nitroGLYCERIN (NITROSTAT) 0.4 MG SL tablet Place 0.4 mg under the tongue every 5 (five) minutes as needed for chest pain.    . polyethylene glycol powder (GLYCOLAX/MIRALAX) powder MIX 1 CAPFUL TWICE DAILY AS NEEDED. 250 g 0  . QVAR 40 MCG/ACT inhaler INHALE 1 PUFF TWICE DAILY AS DIRECTED. 8.7 g 5  . rosuvastatin (CRESTOR) 40 MG tablet Take 40 mg by mouth daily.    . sildenafil (VIAGRA) 100 MG tablet TAKE ONE TABLET BY MOUTH AS NEEDED FOR ERECTILE DYSFUNCTION. 4 tablet 6  . spironolactone (ALDACTONE) 25 MG tablet TAKE (1/2) TABLET DAILY. 15 tablet 0  . testosterone cypionate (DEPOTESTOTERONE CYPIONATE) 200 MG/ML injection Inject into the muscle every 14 (fourteen) days.      . traZODone (DESYREL) 100 MG tablet Take 100 mg by mouth at bedtime as needed for sleep.     Marland Kitchen ZETIA 10 MG tablet TAKE 1 TABLET EACH DAY. 30 tablet 0  . zolpidem (AMBIEN) 10 MG tablet Take 1 tablet by mouth at bedtime as needed.  4  . alfuzosin (UROXATRAL) 10 MG 24 hr tablet Take 10 mg by mouth as needed.     . meloxicam (MOBIC) 15 MG tablet Take 15 mg by mouth daily. Reported on 10/07/2015    . torsemide (DEMADEX) 20 MG tablet Take 1 tablet (20 mg total) by mouth daily as needed. For swelling in legs (Patient not taking: Reported on 10/07/2015) 30 tablet 6   No current facility-administered medications for this encounter.    PHYSICAL EXAM: Filed Vitals:   10/07/15 1447  BP: 114/74  Pulse: 78    General:  Normal. No acute distress. Fatigued appearing. Slurring words.  HEENT: normal Neck: supple. JVP flat. Carotids 2+ bilaterally; no bruits. No lymphadenopathy or thryomegaly appreciated. Cor: PMI normal. Regular rate & rhythm. No  rubs, gallops, murmur Lungs: Clear x for mild crackle RLL Abdomen: obese soft, nontender, nondistended. No hepatosplenomegaly. No bruits or masses. Good bowel sounds. Extremities: no cyanosis, clubbing, rash, edema Neuro: alert & oriented x 3, cranial nerves grossly intact. Moves all 4 extremities w/o difficulty. Affect pleasant.  ECG: NSR 73. IVCD Non-specific ST-T wave abnormalities.    ASSESSMENT & PLAN:  1. CAD with ischemic CM --has intermittent CP but is stable. Recent cath reviewed and anatomy stable. Continue ASA, statin and b-blocker 2. Chronic systolic HF EF 27-03%. S/p STJ ICD --Stable NYHA II-III. Volume status  looks good. Continue current meds. Will not titrate due to low BP 3. Severe back pain on high dose MS-Contin --He appeared intoxicated and dyscoordinated in clinic today. I told him I felt he was taking too much pain medicines but he insisted he ahs been on the same dose for 5 years and is followed by the Pain Clinic 4. Polycythemia  --he has seen hematology at [facility] and felt to be due to chronic hypoxia from OSA 5. OSA on CPAP --suspect noncompliance 6.Cough  --check CXR 7. Dizziness with ? TIA --will check carotid u/s   Antanette Richwine,MD 3:28 PM

## 2015-10-09 ENCOUNTER — Ambulatory Visit (HOSPITAL_COMMUNITY)
Admission: RE | Admit: 2015-10-09 | Discharge: 2015-10-09 | Disposition: A | Discharge status: Home / Self Care | Payer: BLUE CROSS/BLUE SHIELD | Source: Ambulatory Visit | Attending: Cardiovascular Disease | Admitting: Cardiovascular Disease

## 2015-10-09 DIAGNOSIS — I2510 Atherosclerotic heart disease of native coronary artery without angina pectoris: Secondary | ICD-10-CM | POA: Insufficient documentation

## 2015-10-09 DIAGNOSIS — I6522 Occlusion and stenosis of left carotid artery: Secondary | ICD-10-CM | POA: Insufficient documentation

## 2015-10-09 DIAGNOSIS — F419 Anxiety disorder, unspecified: Secondary | ICD-10-CM | POA: Diagnosis not present

## 2015-10-09 DIAGNOSIS — F329 Major depressive disorder, single episode, unspecified: Secondary | ICD-10-CM | POA: Diagnosis not present

## 2015-10-09 DIAGNOSIS — R42 Dizziness and giddiness: Secondary | ICD-10-CM | POA: Insufficient documentation

## 2015-10-09 DIAGNOSIS — Z95810 Presence of automatic (implantable) cardiac defibrillator: Secondary | ICD-10-CM | POA: Insufficient documentation

## 2015-10-09 DIAGNOSIS — I5022 Chronic systolic (congestive) heart failure: Secondary | ICD-10-CM | POA: Insufficient documentation

## 2015-10-12 DIAGNOSIS — G4733 Obstructive sleep apnea (adult) (pediatric): Secondary | ICD-10-CM | POA: Diagnosis not present

## 2015-10-12 DIAGNOSIS — G4737 Central sleep apnea in conditions classified elsewhere: Secondary | ICD-10-CM | POA: Diagnosis not present

## 2015-10-22 ENCOUNTER — Encounter: Payer: BLUE CROSS/BLUE SHIELD | Admitting: Registered Nurse

## 2015-10-28 ENCOUNTER — Ambulatory Visit: Payer: Self-pay | Admitting: Internal Medicine

## 2015-10-29 ENCOUNTER — Encounter: Payer: BLUE CROSS/BLUE SHIELD | Attending: Physical Medicine & Rehabilitation

## 2015-10-29 ENCOUNTER — Encounter: Payer: Self-pay | Admitting: Physical Medicine & Rehabilitation

## 2015-10-29 ENCOUNTER — Ambulatory Visit (HOSPITAL_BASED_OUTPATIENT_CLINIC_OR_DEPARTMENT_OTHER): Payer: BLUE CROSS/BLUE SHIELD | Admitting: Physical Medicine & Rehabilitation

## 2015-10-29 VITALS — BP 123/67 | HR 87

## 2015-10-29 DIAGNOSIS — G8929 Other chronic pain: Secondary | ICD-10-CM

## 2015-10-29 DIAGNOSIS — Z5181 Encounter for therapeutic drug level monitoring: Secondary | ICD-10-CM | POA: Diagnosis not present

## 2015-10-29 DIAGNOSIS — R5381 Other malaise: Secondary | ICD-10-CM | POA: Insufficient documentation

## 2015-10-29 DIAGNOSIS — M4806 Spinal stenosis, lumbar region: Secondary | ICD-10-CM | POA: Diagnosis not present

## 2015-10-29 DIAGNOSIS — M549 Dorsalgia, unspecified: Secondary | ICD-10-CM | POA: Diagnosis not present

## 2015-10-29 DIAGNOSIS — Z79899 Other long term (current) drug therapy: Secondary | ICD-10-CM | POA: Diagnosis not present

## 2015-10-29 DIAGNOSIS — M48062 Spinal stenosis, lumbar region with neurogenic claudication: Secondary | ICD-10-CM

## 2015-10-29 MED ORDER — MORPHINE SULFATE 30 MG PO TABS: 30.0000 mg | ORAL_TABLET | Freq: Four times a day (QID) | ORAL | Status: DC | PRN

## 2015-10-29 MED ORDER — MORPHINE SULFATE ER 100 MG PO TBCR: 100.0000 mg | EXTENDED_RELEASE_TABLET | Freq: Three times a day (TID) | ORAL | Status: DC

## 2015-10-29 NOTE — Progress Notes (Addendum)
Subjective:    Patient ID: [name], male    DOB: 28-Jul-1960, 55 y.o.   MRN: [id number]  HPI 55 year old male with lumbar spinal stenosis causing neurogenic claudication returns today for medication monitoring and follow-up. He continues to be under the care of to cardiologist for his congestive heart failure. His last ejection fraction was 35%. He is not felt to be a great surgical candidate for elective surgeries and does not want to consider surgical decompression of his lumbar spine. He has remained active on his high dose opiate medications. He has managed his opioid induced constipation with medications. He has not had any other significant side effects. Interval history he had a fall while getting out of the vehicle in [place]. He had extensive bruising of his right hip. He's had some residual pain in the right hip. He has been evaluated by orthopedics and an ultrasound was performed. He has not followed up with orthopedics. He's been using the recumbent bicycle but is down to 10-15 minutes a day based on his hip pain. He was at one point going 30-45 minutes per day.  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 Pain Inventory Average Pain 8 Pain Right Now 8 My pain is sharp, burning, dull, stabbing and tingling  In the last 24 hours, has pain interfered with the following? General activity 5 Relation with others 5 Enjoyment of life 5 What TIME of day is your pain at its worst? evening Sleep (in general) Fair  Pain is worse with: walking, bending, standing and some activites Pain improves with: rest and medication Relief from Meds: 7  Mobility walk without assistance use a walker ability to climb steps?  yes do you drive?  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  no  Physicians involved in your care Any changes since last visit?  no   Family History  Problem Relation Age of Onset  . Heart disease Father 73    Died of MI   Social History   Social History  . Marital Status: Married    Spouse Name: N/A  . Number of Children: N/A  . Years of Education: N/A   Social History Main Topics  . Smoking status: Current Some Day Smoker -- 1.50 packs/day for 29 years    Types: Cigarettes, E-cigarettes  . Smokeless tobacco: Never Used     Comment: PT STOPPED SMOKING CIGS 03/27/13. NOW CURRENTLY USING E-CIG and smoking a few cigerettes per week.  . Alcohol Use: No  . Drug Use: Yes    Special: Marijuana     Comment: Urine showed THC  . Sexual Activity: Not Asked   Other Topics Concern  . None   Social History Narrative   Past Surgical History  Procedure Laterality Date  . Hernia repair    . Asd repair, sinus venosus    . Testicle surgery      testicular cancer surgery  . Cardiac defibrillator placement  08/2010  . Left heart catheterization with coronary angiogram N/A 06/14/2014    Procedure: LEFT HEART CATHETERIZATION WITH CORONARY ANGIOGRAM;  Surgeon: Jolaine Artist, MD;  Location: [facility] CATH LAB;  Service: Cardiovascular;  Laterality: N/A;   Past Medical History  Diagnosis Date  .  Chronic systolic heart failure (HCC)     EF 20-25%. s/p ST. Jude ICD  . CAD (coronary artery disease)     Last cath 2/12. 3-v CAD. Failed PCI of distal RCAc  CATH [facility] 4/13 with DES to  LAD X2  . Chronic back pain     lumbar stenosis  . Benign prostatic hypertrophy   . Depression   . History of testicular cancer   . Narcotic dependence (HCC)     chronic  . Benzodiazepine dependence (HCC)     chronic  . Anxiety   . CHF (congestive heart failure) ([place])   . DJD (degenerative joint disease)   . Automatic implantable cardiac defibrillator  St Judes     Analyze ST   There were no vitals taken for this visit.  Opioid Risk Score:   Fall Risk Score:  `1  Depression screen PHQ 2/9  Depression screen [facility] 2/9 10/01/2015 06/16/2015 06/13/2015 03/13/2015 02/14/2015 01/30/2015 12/04/2014  Decreased Interest 1 0 0 1 1 0 0  Down, Depressed, Hopeless 0 0 0 0 0 0 0  PHQ - 2 Score 1 0 0 1 1 0 0  Altered sleeping - - - - - - -  Tired, decreased energy - - - - - - -  Change in appetite - - - - - - -  Feeling bad or failure about yourself  - - - - - - -  Trouble concentrating - - - - - - -  Moving slowly or fidgety/restless - - - - - - -  Suicidal thoughts - - - - - - -  PHQ-9 Score - - - - - - -     Review of Systems     Objective:   Physical Exam  Constitutional: He is oriented to person, place, and time. He appears well-developed and well-nourished.  HENT:  Head: Normocephalic and atraumatic.  Eyes: Conjunctivae are normal. Pupils are equal, round, and reactive to light.  Musculoskeletal:       Right hip: He exhibits bony tenderness. He exhibits normal range of motion, no swelling, no crepitus and no deformity.       Thoracic back: He exhibits deformity.       Lumbar back: He exhibits decreased range of motion. He exhibits no tenderness.  Tenderness over the right greater trochanter  Neurological: He is alert and oriented to person, place, and time.  Psychiatric: He has a normal mood and affect.  Nursing note and vitals reviewed.         Assessment & Plan:  1. Lumbar spinal stenosis with neurogenic claudication. Ambulatory with walker but sometimes does not use it. He remains functional on high-dose opiate, no significant side effects. Constipation well managed with laxatives. No surgery planned given cardiac history  Recommend increasing his recumbent bicycling to 30-45 minutes per day  We discussed his opioid dose, he has the highest milligram per day of my patient panel. Would not feel comfortable going up and at this  point patient feels like his pain is well managed Continue MS Contin 100 mg 3 times a day Continue MSIR 30 mg 4 times a day  Continue opioid monitoring program. This consists of regular clinic visits, examinations, urine drug screen, pill counts as well as use of [place] controlled substance reporting System.  2. Right hip pain likely trochanteric bursitis after fall. We discussed treatment options including injection. He plans to follow-up with orthopedic surgery

## 2015-10-30 ENCOUNTER — Ambulatory Visit: Payer: Self-pay | Admitting: Acute Care

## 2015-11-01 ENCOUNTER — Encounter: Payer: Self-pay | Admitting: Acute Care

## 2015-11-01 ENCOUNTER — Ambulatory Visit (INDEPENDENT_AMBULATORY_CARE_PROVIDER_SITE_OTHER): Payer: BLUE CROSS/BLUE SHIELD | Admitting: Acute Care

## 2015-11-01 VITALS — BP 122/72 | HR 71 | Ht 72.0 in | Wt 215.8 lb

## 2015-11-01 DIAGNOSIS — J209 Acute bronchitis, unspecified: Secondary | ICD-10-CM | POA: Diagnosis not present

## 2015-11-01 DIAGNOSIS — G4733 Obstructive sleep apnea (adult) (pediatric): Secondary | ICD-10-CM

## 2015-11-01 DIAGNOSIS — Z720 Tobacco use: Secondary | ICD-10-CM | POA: Diagnosis not present

## 2015-11-01 DIAGNOSIS — R0600 Dyspnea, unspecified: Secondary | ICD-10-CM | POA: Diagnosis not present

## 2015-11-01 DIAGNOSIS — Z9989 Dependence on other enabling machines and devices: Secondary | ICD-10-CM

## 2015-11-01 DIAGNOSIS — G4731 Primary central sleep apnea: Secondary | ICD-10-CM

## 2015-11-01 DIAGNOSIS — G4737 Central sleep apnea in conditions classified elsewhere: Secondary | ICD-10-CM

## 2015-11-01 MED ORDER — ZOSTER VACCINE LIVE 19400 UNT/0.65ML ~~LOC~~ SOLR: 0.6500 mL | Freq: Once | SUBCUTANEOUS | Status: DC

## 2015-11-01 MED ORDER — BECLOMETHASONE DIPROPIONATE 40 MCG/ACT IN AERS: 2.0000 | INHALATION_SPRAY | Freq: Two times a day (BID) | RESPIRATORY_TRACT | Status: DC

## 2015-11-01 MED ORDER — AZITHROMYCIN 250 MG PO TABS: ORAL_TABLET | ORAL | Status: DC

## 2015-11-01 NOTE — Assessment & Plan Note (Signed)
92% compliance 33% of nights less than 4 hours. States decreased daytime sleepiness AHI: 3.1  Plan: Continue on CPAP at bedtime. You appear to be benefiting from the treatment Goal is to wear for at least 4-6 hours each night for maximal clinical benefit. DME to re-measure for better mask fit Continue to work on weight loss, as the link between excess weight  and sleep apnea is well established.  Do not drive if sleepy. Follow up with Dr. Halford Chessman In 1 year or before as needed.

## 2015-11-01 NOTE — Patient Instructions (Addendum)
It is nice to meet you today We will prescribe a zpack for you today Use Mucinex to help with chest congestion CT Chest w/o contrast to follow up on weight loss. Prescription for Shingles vaccine to be given at [facility] for Qvar I am glad you are doing well on your  We will have the DME evaluate you for new head gear Continue wearing your oxygen at night as ordered. Continue on CPAP at bedtime. You appear to be benefiting from the treatment Goal is to wear for at least 4-6 hours each night for maximal clinical benefit. Continue to work on weight loss, as the link between excess weight  and sleep apnea is well established.  Do not drive if sleepy. Follow up in 3 weeks to ensure cough is improving. Follow up with Dr. Halford Chessman  or  in 1 year or before as needed.  Please contact office for sooner follow up if symptoms do not improve or worsen or seek emergency care

## 2015-11-01 NOTE — Progress Notes (Signed)
Subjective:    Patient ID: [name], male    DOB: 1961/03/10, 55 y.o.   MRN: [id number]  HPI   55 year old male smoker ( " quit"  in 2014, now using e-cigarettes and smoking "a few" cigarettes per week )previously followed by Dr. Gwenette Greet for  Complex sleep apnea. 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  Significant Events/Procedures:  Cardiac Cath 09/2013 at [facility]: Stable CAD, medical treatment: Last Echo: EF 35% St. Jude ICD implantation Seen at [facility], but needed to be tobacco free for 1 full year. Also followed at [facility] by Dr Corrin Parker (Pulmonary) and Dr. Annabelle Harman in hematology for polycythemia.   10/07/2015: CXR IMPRESSION: No active cardiopulmonary disease.  CPAP:  Compliance Report : 09/02/2015-10/31/2015  Usage: 55 out of 60 days( 92%) > 4 hours: 35 days (58%) <4 hours: 20 days ( 33%) Average usage ( total days) 4 hours 20 minutes Average usage ( days used) 4 hours 44 minutes  Mode: ASV EPAP: 6 cm H2O Min PS: 3 cm H20 Max PS 15 cm H2O AHI: 3.1  11/01/2015 CPAP Compliance Office Visit:  Patient comes in today for follow-up of his complex sleep apnea. He is being maintained on an ASV device, and feels that he continues to do well. He denies any issues with his machine, states his mask applies too much pressure over the bridge of his nose but does not have an issue with leak. His compliance is 92%, but he often times is  wearing it less than 4 hours. He continues to feel that it helps his sleep, increases his daytime alertness, and that he is benefiting from treatment. Remains on high-dose MS-Contin for back and hip pain. He is followed at the pain clinic. Also complains of productive cough with  green sputum and claims to have had low grade fever.CXR done by Dr. Haroldine Laws 10/07/2015 indicated no active cardiopulmonary disease. He states that his weight has gone from 270 lbs to 215 pounds in 1.5 years. He states this was not intentional. With his significant smoking history, and last CT chest in 2015 we will repeat CT w/o contrast. He has been told to wear his oxygen 12 hours per day by his [place], but he states that he cannot do this while working full time.    Current outpatient prescriptions:  .  alfuzosin (UROXATRAL) 10 MG 24 hr tablet, Take 10 mg by mouth as needed. , Disp: , Rfl:  .  aspirin 325 MG tablet, Take 325 mg by mouth daily., Disp: , Rfl:  .  carvedilol (COREG) 25 MG tablet, Take 1 tablet (25 mg total) by mouth 2 (two) times daily., Disp: 180 tablet, Rfl: 2 .  clopidogrel (PLAVIX) 75 MG tablet, TAKE 1 TABLET EACH DAY., Disp: 90 tablet, Rfl: 2 .  diazepam (VALIUM) 10 MG tablet, Take 10 mg by mouth 2 (two) times daily. , Disp: , Rfl:  .  ezetimibe (ZETIA) 10 MG tablet, TAKE 1 TABLET EACH DAY., Disp: 90 tablet, Rfl: 2 .  lactulose (CHRONULAC) 10 GM/15ML solution, Take by mouth. Reported on 10/07/2015, Disp: , Rfl:  .  lisinopril (PRINIVIL,ZESTRIL) 2.5 MG tablet, Take 1 tablet (2.5 mg total) by mouth at bedtime., Disp: 90 tablet, Rfl: 2 .  LORazepam (ATIVAN) 1 MG tablet, Take 1 mg by mouth 4 (four) times daily as needed. For anxiety, Disp: , Rfl:  .  meloxicam (MOBIC) 15 MG tablet, Take 15 mg by mouth daily. Reported on 10/07/2015, Disp: , Rfl:  .  morphine (MS CONTIN) 100 MG 12 hr tablet, Take 1 tablet (100 mg total) by mouth 3 (three) times daily., Disp: 90 tablet, Rfl: 0 .  morphine (MSIR) 30 MG tablet, Take 1 tablet (30 mg total) by mouth every 6 (six) hours as needed for severe pain., Disp: 120 tablet, Rfl: 0 .  nitroGLYCERIN (NITROSTAT) 0.4 MG SL tablet, Place 0.4 mg under the tongue every 5 (five) minutes as needed for chest pain., Disp: , Rfl:  .  polyethylene  glycol powder (GLYCOLAX/MIRALAX) powder, MIX 1 CAPFUL TWICE DAILY AS NEEDED., Disp: 250 g, Rfl: 0 .  QVAR 40 MCG/ACT inhaler, INHALE 1 PUFF TWICE DAILY AS DIRECTED., Disp: 8.7 g, Rfl: 5 .  rosuvastatin (CRESTOR) 40 MG tablet, Take 1 tablet (40 mg total) by mouth daily., Disp: 90 tablet, Rfl: 2 .  sildenafil (VIAGRA) 100 MG tablet, TAKE ONE TABLET BY MOUTH AS NEEDED FOR ERECTILE DYSFUNCTION., Disp: 4 tablet, Rfl: 6 .  spironolactone (ALDACTONE) 25 MG tablet, TAKE (1/2) TABLET DAILY., Disp: 45 tablet, Rfl: 2 .  testosterone cypionate (DEPOTESTOTERONE CYPIONATE) 200 MG/ML injection, Inject into the muscle every 14 (fourteen) days.  , Disp: , Rfl:  .  torsemide (DEMADEX) 20 MG tablet, Take 1 tablet (20 mg total) by mouth daily as needed. For swelling in legs, Disp: 90 tablet, Rfl: 2 .  traZODone (DESYREL) 100 MG tablet, Take 100 mg by mouth at bedtime as needed for sleep. , Disp: , Rfl:  .  zolpidem (AMBIEN) 10 MG tablet, Take 1 tablet by mouth at bedtime as needed., Disp: , Rfl: 4   Past Medical History  Diagnosis Date  . Chronic systolic heart failure (HCC)     EF 20-25%. s/p ST. Jude ICD  . CAD (coronary artery disease)     Last cath 2/12. 3-v CAD. Failed PCI of distal RCAc  CATH [facility] 4/13 with DES to  LAD X2  . Chronic back pain     lumbar stenosis  . Benign prostatic hypertrophy   . Depression   . History of testicular cancer   . Narcotic dependence (HCC)     chronic  . Benzodiazepine dependence (HCC)     chronic  . Anxiety   . CHF (congestive heart failure) ([place])   . DJD (degenerative joint disease)   . Automatic implantable cardiac defibrillator St Judes     Analyze ST    Allergies  Allergen Reactions  . Darvocet [Propoxyphene N-Acetaminophen] Anaphylaxis    Throat closes  . Propoxyphene Anaphylaxis and Swelling  . Levaquin [Levofloxacin In D5w]     Joint aches    Review of Systems Constitutional:   +  weight loss, no night sweats, + Fevers, no chills,+ fatigue, or   lassitude.  HEENT:   No headaches,  Difficulty swallowing,  Tooth/dental problems, or  Sore throat,                No sneezing, itching, ear ache, nasal congestion, post nasal drip,   CV:  No chest pain,  Orthopnea, PND, swelling in lower extremities, anasarca, dizziness, palpitations, syncope.   GI  No heartburn, indigestion, abdominal pain, nausea, vomiting, diarrhea, change in bowel habits, loss of appetite, bloody stools.   Resp: + shortness of breath with  exertion or at rest.  + excess mucus, + productive cough,  No non-productive cough,  No coughing up of blood.  + change in color of mucus.  + wheezing.  No chest wall deformity  Skin: no rash or lesions.  GU: no dysuria, change in color of urine, no urgency or frequency.  No flank pain, no hematuria   MS:  + hip  pain no swelling.  No decreased range of motion.  + back pain.  Psych:  No change in mood or affect. No depression or anxiety.  No memory loss.        Objective:   Physical Exam  BP 122/72 mmHg  Pulse 71  Ht 6' (1.829 m)  Wt 215 lb 12.8 oz (97.886 kg)  BMI 29.26 kg/m2  SpO2 96%  Physical Exam:  General- No distress,  A&Ox3, strange affect ENT: No sinus tenderness, TM clear, pale nasal mucosa, no oral exudate,no post nasal drip, no LAN Cardiac: S1, S2, regular rate and rhythm, no murmur, rub, gallop Chest: No wheeze/+  RLL crackles/ no dullness; no accessory muscle use, no nasal flaring, no sternal retractions Abd.: Soft Non-tender Ext: No clubbing cyanosis, edema Neuro:  normal strength Skin: No rashes, warm and dry Psych: normal mood and behavior Magdalen Spatz, AGACNP-BC [facility] 11/01/2015     Assessment & Plan:

## 2015-11-01 NOTE — Assessment & Plan Note (Signed)
Ongoing tobacco use Advised to quit smoking

## 2015-11-03 NOTE — Progress Notes (Signed)
Reviewed and agree with assessment/plan.  Chesley Mires, MD [facility] Pulmonary/Critical Care 11/03/2015, 11:07 AM Pager:  [phone]

## 2015-11-04 LAB — TOXASSURE SELECT,+ANTIDEPR,UR: PDF: 0

## 2015-11-04 LAB — 6-ACETYLMORPHINE,TOXASSURE ADD
6-ACETYLMORPHINE: NEGATIVE
6-acetylmorphine: NOT DETECTED ng/mg creat

## 2015-11-05 ENCOUNTER — Ambulatory Visit (INDEPENDENT_AMBULATORY_CARE_PROVIDER_SITE_OTHER)
Admission: RE | Admit: 2015-11-05 | Discharge: 2015-11-05 | Disposition: A | Discharge status: Home / Self Care | Payer: BLUE CROSS/BLUE SHIELD | Source: Ambulatory Visit | Attending: Acute Care | Admitting: Acute Care

## 2015-11-05 DIAGNOSIS — R0602 Shortness of breath: Secondary | ICD-10-CM | POA: Diagnosis not present

## 2015-11-05 DIAGNOSIS — R0600 Dyspnea, unspecified: Secondary | ICD-10-CM

## 2015-11-05 NOTE — Progress Notes (Signed)
Urine drug screen for this encounter is consistent for prescribed medication 

## 2015-11-06 ENCOUNTER — Telehealth: Payer: Self-pay | Admitting: Acute Care

## 2015-11-06 NOTE — Telephone Encounter (Signed)
I have called the results of this scan to the patient who verbalized understanding. His main concern was his recent weight loss with his smoking history. We did discuss his CAD which he is aware of and followed by cardiology both in [place] and at [place]. He knew he had fractured the right rib when he fell earlier this year.I will forward the scan to [name] PCP.

## 2015-11-11 DIAGNOSIS — F4322 Adjustment disorder with anxiety: Secondary | ICD-10-CM | POA: Diagnosis not present

## 2015-11-11 DIAGNOSIS — G4737 Central sleep apnea in conditions classified elsewhere: Secondary | ICD-10-CM | POA: Diagnosis not present

## 2015-11-11 DIAGNOSIS — G4733 Obstructive sleep apnea (adult) (pediatric): Secondary | ICD-10-CM | POA: Diagnosis not present

## 2015-11-12 ENCOUNTER — Telehealth (HOSPITAL_COMMUNITY): Payer: Self-pay | Admitting: *Deleted

## 2015-11-12 NOTE — Telephone Encounter (Signed)
Pt given results from carotid u/s

## 2015-11-13 ENCOUNTER — Ambulatory Visit (HOSPITAL_BASED_OUTPATIENT_CLINIC_OR_DEPARTMENT_OTHER): Payer: BLUE CROSS/BLUE SHIELD | Attending: Acute Care | Admitting: Internal Medicine

## 2015-11-13 DIAGNOSIS — Z9989 Dependence on other enabling machines and devices: Principal | ICD-10-CM

## 2015-11-13 DIAGNOSIS — G4733 Obstructive sleep apnea (adult) (pediatric): Secondary | ICD-10-CM

## 2015-11-25 ENCOUNTER — Ambulatory Visit: Payer: Self-pay | Admitting: Acute Care

## 2015-11-28 ENCOUNTER — Encounter: Payer: Self-pay | Admitting: Registered Nurse

## 2015-11-28 ENCOUNTER — Encounter: Payer: BLUE CROSS/BLUE SHIELD | Attending: Physical Medicine & Rehabilitation | Admitting: Registered Nurse

## 2015-11-28 VITALS — BP 109/75 | HR 71 | Resp 14

## 2015-11-28 DIAGNOSIS — M7061 Trochanteric bursitis, right hip: Secondary | ICD-10-CM

## 2015-11-28 DIAGNOSIS — F1120 Opioid dependence, uncomplicated: Secondary | ICD-10-CM | POA: Diagnosis not present

## 2015-11-28 DIAGNOSIS — M4806 Spinal stenosis, lumbar region: Secondary | ICD-10-CM

## 2015-11-28 DIAGNOSIS — M47817 Spondylosis without myelopathy or radiculopathy, lumbosacral region: Secondary | ICD-10-CM | POA: Diagnosis not present

## 2015-11-28 DIAGNOSIS — Z79899 Other long term (current) drug therapy: Secondary | ICD-10-CM

## 2015-11-28 DIAGNOSIS — R5381 Other malaise: Secondary | ICD-10-CM | POA: Diagnosis not present

## 2015-11-28 DIAGNOSIS — G894 Chronic pain syndrome: Secondary | ICD-10-CM | POA: Diagnosis not present

## 2015-11-28 DIAGNOSIS — Z5181 Encounter for therapeutic drug level monitoring: Secondary | ICD-10-CM

## 2015-11-28 DIAGNOSIS — M48062 Spinal stenosis, lumbar region with neurogenic claudication: Secondary | ICD-10-CM

## 2015-11-28 MED ORDER — MORPHINE SULFATE ER 100 MG PO TBCR: 100.0000 mg | EXTENDED_RELEASE_TABLET | Freq: Three times a day (TID) | ORAL | Status: DC

## 2015-11-28 MED ORDER — MORPHINE SULFATE 30 MG PO TABS: 30.0000 mg | ORAL_TABLET | Freq: Four times a day (QID) | ORAL | Status: DC | PRN

## 2015-11-28 NOTE — Progress Notes (Signed)
Subjective:    Patient ID: [name], male    DOB: 04-02-1961, 55 y.o.   MRN: [id number]  HPI: [name] is a 55 year old male who returns for follow up for chronic pain and medication refill. He states his pain is located in his right side, lower back radiating into his lower extremities posteriorly and right hip and right hip. He rates his pain 8. His current exercise regime is walking in his home  Pain Inventory Average Pain 8 Pain Right Now 8 My pain is intermittent, constant, sharp, burning, dull, stabbing, tingling and aching  In the last 24 hours, has pain interfered with the following? General activity 6 Relation with others 8 Enjoyment of life 5 What TIME of day is your pain at its worst? all Sleep (in general) Fair  Pain is worse with: walking, bending, sitting, inactivity, standing and some activites Pain improves with: pacing activities, medication and TENS Relief from Meds: 6  Mobility Do you have any goals in this area?  no  Function Do you have any goals in this area?  no  Neuro/Psych No problems in this area  Prior Studies Any changes since last visit?  no  Physicians involved in your care Any changes since last visit?  no   Family History  Problem Relation Age of Onset  . Heart disease Father 38    Died of MI   Social History   Social History  . Marital Status: Married    Spouse Name: N/A  . Number of Children: N/A  . Years of Education: N/A   Social History Main Topics  . Smoking status: Current Some Day Smoker -- 1.50 packs/day for 29 years    Types: Cigarettes, E-cigarettes  . Smokeless tobacco: Never Used     Comment: PT STOPPED SMOKING CIGS 03/27/13. NOW CURRENTLY USING E-CIG and smoking a few cigerettes per week.  . Alcohol Use: No  . Drug Use: Yes    Special: Marijuana     Comment: Urine showed THC  . Sexual Activity: Not Asked   Other Topics Concern  . None   Social History Narrative   Past Surgical History    Procedure Laterality Date  . Hernia repair    . Asd repair, sinus venosus    . Testicle surgery      testicular cancer surgery  . Cardiac defibrillator placement  08/2010  . Left heart catheterization with coronary angiogram N/A 06/14/2014    Procedure: LEFT HEART CATHETERIZATION WITH CORONARY ANGIOGRAM;  Surgeon: Jolaine Artist, MD;  Location: [facility] CATH LAB;  Service: Cardiovascular;  Laterality: N/A;   Past Medical History  Diagnosis Date  . Chronic systolic heart failure (HCC)     EF 20-25%. s/p ST. Jude ICD  . CAD (coronary artery disease)     Last cath 2/12. 3-v CAD. Failed PCI of distal RCAc  CATH [facility] 4/13 with DES to  LAD X2  . Chronic back pain     lumbar stenosis  . Benign prostatic hypertrophy   . Depression   . History of testicular cancer   . Narcotic dependence (HCC)     chronic  . Benzodiazepine dependence (HCC)     chronic  . Anxiety   . CHF (congestive heart failure) ([place])   . DJD (degenerative joint disease)   . Automatic implantable cardiac defibrillator St Judes     Analyze ST   BP 109/75 mmHg  Pulse 71  Resp 14  SpO2  95%  Opioid Risk Score:   Fall Risk Score:  `1  Depression screen PHQ 2/9  Depression screen [facility] 2/9 10/01/2015 06/16/2015 06/13/2015 03/13/2015 02/14/2015 01/30/2015 12/04/2014  Decreased Interest 1 0 0 1 1 0 0  Down, Depressed, Hopeless 0 0 0 0 0 0 0  PHQ - 2 Score 1 0 0 1 1 0 0  Altered sleeping - - - - - - -  Tired, decreased energy - - - - - - -  Change in appetite - - - - - - -  Feeling bad or failure about yourself  - - - - - - -  Trouble concentrating - - - - - - -  Moving slowly or fidgety/restless - - - - - - -  Suicidal thoughts - - - - - - -  PHQ-9 Score - - - - - - -     Review of Systems  All other systems reviewed and are negative.      Objective:   Physical Exam  Constitutional: He is oriented to person, place, and time. He appears well-developed and well-nourished.  HENT:  Head: Normocephalic and atraumatic.   Neck: Normal range of motion. Neck supple.  Musculoskeletal:  Normal Muscle Bulk and Muscle Testing Reveals: Upper Extremities: Full ROM and Muscle Strength 5/5 Thoracic Paraspinal Tenderness: T-7- T-9 Lumbar Paraspinal Tenderness:L-3- L-5 Lower Extremities: Full ROM and Muscle Strength 5/5 Arises from chair slowly Narrow Based Gait  Neurological: He is alert and oriented to person, place, and time.  Skin: Skin is warm and dry.  Psychiatric: He has a normal mood and affect.  Nursing note and vitals reviewed.         Assessment & Plan:  1. Lumbar spinal stenosis, severe.  Refilled: MS Contin 100 mg one tablet three times a day #90 and MSIR 30 mg one tablet every 6 hours as needed for sever pain #120.  We will continue the opioid monitoring program, this consists of regular clinic visits, examinations, urine drug screen, pill counts as well as use of [place] Controlled Substance Reporting System. 2. Narcotic dependence: Continue opioid monitoring program. 3. Right Greater Trochanteric Bursitis: Continue with Ice Therapy alternating with heat: Will f/u with Ortho. 3. Sleep apnea : compliant with CPAP  4. Constipation: Continue Miralax   20 minutes of face to face patient care time was spent during this visit. All questions were encouraged and answered.   F/U in 1 month

## 2015-12-04 ENCOUNTER — Ambulatory Visit: Payer: Self-pay | Admitting: Neurology

## 2015-12-04 DIAGNOSIS — Z029 Encounter for administrative examinations, unspecified: Secondary | ICD-10-CM

## 2015-12-12 DIAGNOSIS — G4737 Central sleep apnea in conditions classified elsewhere: Secondary | ICD-10-CM | POA: Diagnosis not present

## 2015-12-12 DIAGNOSIS — G4733 Obstructive sleep apnea (adult) (pediatric): Secondary | ICD-10-CM | POA: Diagnosis not present

## 2015-12-26 ENCOUNTER — Encounter: Payer: BLUE CROSS/BLUE SHIELD | Attending: Physical Medicine & Rehabilitation | Admitting: Registered Nurse

## 2015-12-26 ENCOUNTER — Encounter: Payer: Self-pay | Admitting: Registered Nurse

## 2015-12-26 VITALS — BP 102/68 | HR 80 | Resp 14

## 2015-12-26 DIAGNOSIS — G894 Chronic pain syndrome: Secondary | ICD-10-CM

## 2015-12-26 DIAGNOSIS — F1120 Opioid dependence, uncomplicated: Secondary | ICD-10-CM

## 2015-12-26 DIAGNOSIS — M25551 Pain in right hip: Secondary | ICD-10-CM

## 2015-12-26 DIAGNOSIS — Z79899 Other long term (current) drug therapy: Secondary | ICD-10-CM

## 2015-12-26 DIAGNOSIS — M4806 Spinal stenosis, lumbar region: Secondary | ICD-10-CM | POA: Diagnosis not present

## 2015-12-26 DIAGNOSIS — R5381 Other malaise: Secondary | ICD-10-CM | POA: Insufficient documentation

## 2015-12-26 DIAGNOSIS — Z5181 Encounter for therapeutic drug level monitoring: Secondary | ICD-10-CM

## 2015-12-26 DIAGNOSIS — M47817 Spondylosis without myelopathy or radiculopathy, lumbosacral region: Secondary | ICD-10-CM

## 2015-12-26 DIAGNOSIS — M48062 Spinal stenosis, lumbar region with neurogenic claudication: Secondary | ICD-10-CM

## 2015-12-26 MED ORDER — MORPHINE SULFATE 30 MG PO TABS: 30.0000 mg | ORAL_TABLET | Freq: Four times a day (QID) | ORAL | Status: DC | PRN

## 2015-12-26 MED ORDER — MORPHINE SULFATE ER 100 MG PO TBCR: 100.0000 mg | EXTENDED_RELEASE_TABLET | Freq: Three times a day (TID) | ORAL | Status: DC

## 2015-12-26 NOTE — Progress Notes (Signed)
Subjective:    Patient ID: [name], male    DOB: Feb 23, 1961, 55 y.o.   MRN: [id number]  HPI: Mr. DEVONTAE WIERMAN is a 55 year old male who returns for follow up for chronic pain and medication refill. He states his pain is located in his mid-lower back radiating into his right hip and bilateral lower extremities laterally and posteriorly. He did not rate his pain today.His current exercise regime is walking short distances using walker.  Pain Inventory Average Pain Na Pain Right Now NA My pain is constant, sharp, burning, dull, stabbing, tingling and aching  In the last 24 hours, has pain interfered with the following? General activity 3 Relation with others 4 Enjoyment of life 5 What TIME of day is your pain at its worst? All Sleep (in general) NA  Pain is worse with: walking, bending, standing and some activites Pain improves with: rest, pacing activities and medication Relief from Meds: NA  Mobility use a walker Do you have any goals in this area?  yes  Function employed # of hrs/week 20 I need assistance with the following:  dressing, meal prep, household duties and shopping Do you have any goals in this area?  yes  Neuro/Psych anxiety  Prior Studies Any changes since last visit?  no  Physicians involved in your care Any changes since last visit?  no   Family History  Problem Relation Age of Onset  . Heart disease Father 85    Died of MI   Social History   Social History  . Marital Status: Married    Spouse Name: N/A  . Number of Children: N/A  . Years of Education: N/A   Social History Main Topics  . Smoking status: Current Some Day Smoker -- 1.50 packs/day for 29 years    Types: Cigarettes, E-cigarettes  . Smokeless tobacco: Never Used     Comment: PT STOPPED SMOKING CIGS 03/27/13. NOW CURRENTLY USING E-CIG and smoking a few cigerettes per week.  . Alcohol Use: No  . Drug Use: Yes    Special: Marijuana     Comment: Urine showed THC  . Sexual  Activity: Not Asked   Other Topics Concern  . None   Social History Narrative   Past Surgical History  Procedure Laterality Date  . Hernia repair    . Asd repair, sinus venosus    . Testicle surgery      testicular cancer surgery  . Cardiac defibrillator placement  08/2010  . Left heart catheterization with coronary angiogram N/A 06/14/2014    Procedure: LEFT HEART CATHETERIZATION WITH CORONARY ANGIOGRAM;  Surgeon: Jolaine Artist, MD;  Location: [facility] CATH LAB;  Service: Cardiovascular;  Laterality: N/A;   Past Medical History  Diagnosis Date  . Chronic systolic heart failure (HCC)     EF 20-25%. s/p ST. Jude ICD  . CAD (coronary artery disease)     Last cath 2/12. 3-v CAD. Failed PCI of distal RCAc  CATH [facility] 4/13 with DES to  LAD X2  . Chronic back pain     lumbar stenosis  . Benign prostatic hypertrophy   . Depression   . History of testicular cancer   . Narcotic dependence (HCC)     chronic  . Benzodiazepine dependence (HCC)     chronic  . Anxiety   . CHF (congestive heart failure) ([place])   . DJD (degenerative joint disease)   . Automatic implantable cardiac defibrillator [facility]     Analyze  ST   BP 102/68 mmHg  Pulse 80  Resp 14  SpO2 94%  Opioid Risk Score:   Fall Risk Score:  `1  Depression screen PHQ 2/9  Depression screen [facility] 2/9 12/26/2015 10/01/2015 06/16/2015 06/13/2015 03/13/2015 02/14/2015 01/30/2015  Decreased Interest 0 1 0 0 1 1 0  Down, Depressed, Hopeless 0 0 0 0 0 0 0  PHQ - 2 Score 0 1 0 0 1 1 0  Altered sleeping - - - - - - -  Tired, decreased energy - - - - - - -  Change in appetite - - - - - - -  Feeling bad or failure about yourself  - - - - - - -  Trouble concentrating - - - - - - -  Moving slowly or fidgety/restless - - - - - - -  Suicidal thoughts - - - - - - -  PHQ-9 Score - - - - - - -       Review of Systems  All other systems reviewed and are negative.      Objective:   Physical Exam  Constitutional: He is oriented to  person, place, and time. He appears well-developed and well-nourished.  HENT:  Head: Normocephalic and atraumatic.  Neck: Normal range of motion. Neck supple.  Cardiovascular: Normal rate and regular rhythm.   Pulmonary/Chest: Effort normal and breath sounds normal.  Musculoskeletal:  Normal Muscle Bulk and Muscle Testing Reveals: Upper Extremities: Full ROM and Muscle Strength 5/5 Lumbar Paraspinal Tenderness: L-4- L-5 Lower Extremities: Full ROM and Muscle Strength 5/5 Arises from chair with ease using walker for support Narrow Based Gait  Neurological: He is alert and oriented to person, place, and time.  Skin: Skin is warm and dry.  Psychiatric: He has a normal mood and affect.  Nursing note and vitals reviewed.         Assessment & Plan:  1. Lumbar spinal stenosis, severe.  Refilled: MS Contin 100 mg one tablet three times a day #90 and MSIR 30 mg one tablet every 6 hours as needed for sever pain #120.  We will continue the opioid monitoring program, this consists of regular clinic visits, examinations, urine drug screen, pill counts as well as use of [place] Controlled Substance Reporting System. 2. Narcotic dependence: Continue opioid monitoring program. 3. Right Greater Trochanteric Bursitis: Continue with Ice Therapy alternating with heat: 3. Sleep apnea : compliant with CPAP  4. Constipation: Continue Miralax   20 minutes of face to face patient care time was spent during this visit. All questions were encouraged and answered.   F/U in 1 month

## 2015-12-30 ENCOUNTER — Telehealth: Payer: Self-pay | Admitting: Cardiology

## 2015-12-30 NOTE — Telephone Encounter (Signed)
Spoke w/ pt and requested that he send a manual transmission b/c his home monitor has not updated in at least 8 days.   

## 2015-12-31 ENCOUNTER — Ambulatory Visit (INDEPENDENT_AMBULATORY_CARE_PROVIDER_SITE_OTHER): Payer: BLUE CROSS/BLUE SHIELD | Admitting: *Deleted

## 2015-12-31 DIAGNOSIS — I255 Ischemic cardiomyopathy: Secondary | ICD-10-CM | POA: Diagnosis not present

## 2016-01-06 NOTE — Progress Notes (Signed)
Remote ICD transmission.   

## 2016-01-07 ENCOUNTER — Ambulatory Visit (INDEPENDENT_AMBULATORY_CARE_PROVIDER_SITE_OTHER): Payer: BLUE CROSS/BLUE SHIELD | Admitting: Physician Assistant

## 2016-01-07 VITALS — BP 118/74 | HR 76 | Temp 98.1°F | Resp 18 | Ht 72.0 in | Wt 206.2 lb

## 2016-01-07 DIAGNOSIS — T23012A Burn of unspecified degree of left thumb (nail), initial encounter: Secondary | ICD-10-CM | POA: Diagnosis not present

## 2016-01-07 DIAGNOSIS — M79645 Pain in left finger(s): Secondary | ICD-10-CM

## 2016-01-07 DIAGNOSIS — T300 Burn of unspecified body region, unspecified degree: Secondary | ICD-10-CM

## 2016-01-07 DIAGNOSIS — L02512 Cutaneous abscess of left hand: Secondary | ICD-10-CM | POA: Diagnosis not present

## 2016-01-07 LAB — CUP PACEART REMOTE DEVICE CHECK
Brady Statistic RA Percent Paced: 1 % — CL
Brady Statistic RV Percent Paced: 1 % — CL
Date Time Interrogation Session: 20170627101842
HighPow Impedance: 75 Ohm
Implantable Lead Implant Date: 20120201
Implantable Lead Implant Date: 20120201
Implantable Lead Location: 753859
Implantable Lead Location: 753860
Lead Channel Impedance Value: 310 Ohm
Lead Channel Impedance Value: 450 Ohm
Lead Channel Sensing Intrinsic Amplitude: 12 mV
Lead Channel Sensing Intrinsic Amplitude: 4.6 mV
Lead Channel Setting Pacing Amplitude: 2 V
Lead Channel Setting Pacing Amplitude: 3 V
Lead Channel Setting Pacing Pulse Width: 0.5 ms
Lead Channel Setting Sensing Sensitivity: 0.5 mV
Pulse Gen Serial Number: 815096

## 2016-01-07 MED ORDER — ONDANSETRON 8 MG PO TBDP: 8.0000 mg | ORAL_TABLET | Freq: Three times a day (TID) | ORAL | Status: DC | PRN

## 2016-01-07 MED ORDER — CEPHALEXIN 500 MG PO CAPS: 500.0000 mg | ORAL_CAPSULE | Freq: Two times a day (BID) | ORAL | Status: DC

## 2016-01-07 NOTE — Progress Notes (Signed)
PROCEDURE NOTE: I&D of Abscess Verbal consent obtained. Digital block performed with 2.5cc of 0.5% marcaine. Site cleansed with alcohol prep pad.  Incision of 1cm was made using a 11 blade, discharge of small amount purulent material but mostly serosanguinous fluid. Wound cavity was explored with curved hemostats and no packing was used. Cleansed and dressed. After care instructions provided. Patient to return to clinic on 01/09/2016 for reevaluation/repacking.  Jaynee Eagles, PA-C [facility] [phone] 01/07/2016  8:16 PM

## 2016-01-07 NOTE — Patient Instructions (Addendum)
Incision and Drainage, Care After Refer to this sheet in the next few weeks. These instructions provide you with information on caring for yourself after your procedure. Your caregiver may also give you more specific instructions. Your treatment has been planned according to current medical practices, but problems sometimes occur. Call your caregiver if you have any problems or questions after your procedure. HOME CARE INSTRUCTIONS   If antibiotic medicine is given, take it as directed. Finish it even if you start to feel better.  Only take over-the-counter or prescription medicines for pain, discomfort, or fever as directed by your caregiver.  Keep all follow-up appointments as directed by your caregiver.  Change any bandages (dressings) as directed by your caregiver. Replace old dressings with clean dressings.  Wash your hands before and after caring for your wound. You will receive specific instructions for cleansing and caring for your wound.  SEEK MEDICAL CARE IF:   You have increased pain, swelling, or redness around the wound.  You have increased drainage, smell, or bleeding from the wound.  You have muscle aches, chills, or you feel generally sick.  You have a fever. MAKE SURE YOU:   Understand these instructions.  Will watch your condition.  Will get help right away if you are not doing well or get worse.   This information is not intended to replace advice given to you by your health care provider. Make sure you discuss any questions you have with your health care provider.   Document Released: 09/21/2011 Document Revised: 07/20/2014 Document Reviewed: 09/21/2011 Elsevier Interactive Patient Education 2016 [employer]     IF you received an x-ray today, you will receive an invoice from [facility]. Please contact [facility] at [phone] with questions or concerns regarding your invoice.   IF you received labwork today, you will receive an  invoice from [facility]. Please contact [facility] at [phone] with questions or concerns regarding your invoice.   Our billing staff will not be able to assist you with questions regarding bills from these companies.  You will be contacted with the lab results as soon as they are available. The fastest way to get your results is to activate your My Chart account. Instructions are located on the last page of this paperwork. If you have not heard from us regarding the results in 2 weeks, please contact this office.      
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
## 2016-01-10 LAB — WOUND CULTURE: Gram Stain: NONE SEEN

## 2016-01-11 DIAGNOSIS — G4737 Central sleep apnea in conditions classified elsewhere: Secondary | ICD-10-CM | POA: Diagnosis not present

## 2016-01-11 DIAGNOSIS — G4733 Obstructive sleep apnea (adult) (pediatric): Secondary | ICD-10-CM | POA: Diagnosis not present

## 2016-01-17 ENCOUNTER — Encounter: Payer: Self-pay | Admitting: Cardiology

## 2016-01-22 ENCOUNTER — Encounter: Payer: Self-pay | Admitting: Registered Nurse

## 2016-01-22 ENCOUNTER — Encounter: Payer: BLUE CROSS/BLUE SHIELD | Attending: Physical Medicine & Rehabilitation | Admitting: Registered Nurse

## 2016-01-22 VITALS — BP 105/70 | HR 74

## 2016-01-22 DIAGNOSIS — G894 Chronic pain syndrome: Secondary | ICD-10-CM

## 2016-01-22 DIAGNOSIS — Z5181 Encounter for therapeutic drug level monitoring: Secondary | ICD-10-CM

## 2016-01-22 DIAGNOSIS — M47817 Spondylosis without myelopathy or radiculopathy, lumbosacral region: Secondary | ICD-10-CM | POA: Diagnosis not present

## 2016-01-22 DIAGNOSIS — F1120 Opioid dependence, uncomplicated: Secondary | ICD-10-CM

## 2016-01-22 DIAGNOSIS — R5381 Other malaise: Secondary | ICD-10-CM | POA: Diagnosis not present

## 2016-01-22 DIAGNOSIS — Z79899 Other long term (current) drug therapy: Secondary | ICD-10-CM

## 2016-01-22 MED ORDER — MORPHINE SULFATE 30 MG PO TABS: 30.0000 mg | ORAL_TABLET | Freq: Four times a day (QID) | ORAL | Status: DC | PRN

## 2016-01-22 MED ORDER — MORPHINE SULFATE ER 100 MG PO TBCR: 100.0000 mg | EXTENDED_RELEASE_TABLET | Freq: Three times a day (TID) | ORAL | Status: DC

## 2016-01-22 NOTE — Progress Notes (Signed)
Subjective:    Patient ID: [name], male    DOB: 28-Oct-1960, 55 y.o.   MRN: [id number]  HPI: [name] is a 55 year old male who returns for follow up for chronic pain and medication refill. He states his pain is located in his lower back radiating into his bilateral lower extremities posteriorly. He rate his pain 8.His current exercise regime is walking short distances using walker.  Pain Inventory Average Pain 7 Pain Right Now 8 My pain is constant, sharp, burning, dull, stabbing, tingling and aching  In the last 24 hours, has pain interfered with the following? General activity 7 Relation with others 7 Enjoyment of life 7 What TIME of day is your pain at its worst? all times Sleep (in general) Poor  Pain is worse with: walking, bending, standing and some activites Pain improves with: medication Relief from Meds: 5  Mobility walk with assistance use a walker ability to climb steps?  yes do you drive?  yes needs help with transfers Do you have any goals in this area?  no  Function employed # of hrs/week 35-40 I need assistance with the following:  dressing, meal prep, household duties and shopping Do you have any goals in this area?  no  Neuro/Psych trouble walking anxiety  Prior Studies Any changes since last visit?  no  Physicians involved in your care Any changes since last visit?  no   Family History  Problem Relation Age of Onset  . Heart disease Father 68    Died of MI   Social History   Social History  . Marital Status: Married    Spouse Name: N/A  . Number of Children: N/A  . Years of Education: N/A   Social History Main Topics  . Smoking status: Current Some Day Smoker -- 1.50 packs/day for 29 years    Types: Cigarettes, E-cigarettes  . Smokeless tobacco: Never Used     Comment: PT STOPPED SMOKING CIGS 03/27/13. NOW CURRENTLY USING E-CIG and smoking a few cigerettes per week.  . Alcohol Use: No  . Drug Use: Yes    Special:  Marijuana     Comment: Urine showed THC  . Sexual Activity: Not Asked   Other Topics Concern  . None   Social History Narrative   Past Surgical History  Procedure Laterality Date  . Hernia repair    . Asd repair, sinus venosus    . Testicle surgery      testicular cancer surgery  . Cardiac defibrillator placement  08/2010  . Left heart catheterization with coronary angiogram N/A 06/14/2014    Procedure: LEFT HEART CATHETERIZATION WITH CORONARY ANGIOGRAM;  Surgeon: Jolaine Artist, MD;  [facility] C[facility] LAB;  Service: Cardiovascular;  Laterality: N/A;   Past Medical History  Diagnosis Date  . Chronic systolic heart failure (HCC)     EF 20-25%. s/p ST. Jude ICD  . CAD (coronary artery disease)     Last cath 2/12. 3-v CAD. Failed PCI of distal RCAc  CATH [facility] 4/13 with DES to  LAD X2  . Chronic back pain     lumbar stenosis  . Benign prostatic hypertrophy   . Depression   . History of testicular cancer   . Narcotic dependence (HCC)     chronic  . Benzodiazepine dependence (HCC)     chronic  . Anxiety   . CHF (congestive heart failure) ([place])   . DJD (degenerative joint disease)   . Automatic  implantable cardiac defibrillator St Judes     Analyze ST   BP 105/70 mmHg  Pulse 74  SpO2 95%  Opioid Risk Score:   Fall Risk Score:  `1  Depression screen PHQ 2/9  Depression screen [facility] 2/9 01/07/2016 12/26/2015 10/01/2015 06/16/2015 06/13/2015 03/13/2015 02/14/2015  Decreased Interest 0 0 1 0 0 1 1  Down, Depressed, Hopeless 0 0 0 0 0 0 0  PHQ - 2 Score 0 0 1 0 0 1 1  Altered sleeping - - - - - - -  Tired, decreased energy - - - - - - -  Change in appetite - - - - - - -  Feeling bad or failure about yourself  - - - - - - -  Trouble concentrating - - - - - - -  Moving slowly or fidgety/restless - - - - - - -  Suicidal thoughts - - - - - - -  PHQ-9 Score - - - - - - -     Review of Systems  Constitutional: Negative.   HENT: Negative.   Eyes: Negative.   Respiratory:  Negative.   Cardiovascular: Negative.   Gastrointestinal: Negative.   Endocrine: Negative.   Genitourinary: Negative.   Musculoskeletal: Positive for back pain.  Skin: Negative.   Allergic/Immunologic: Negative.   Neurological: Negative.   Hematological: Negative.   Psychiatric/Behavioral: Negative.        Objective:   Physical Exam  Constitutional: He is oriented to person, place, and time. He appears well-developed and well-nourished.  HENT:  Head: Normocephalic and atraumatic.  Neck: Normal range of motion. Neck supple.  Cardiovascular: Normal rate and regular rhythm.   Pulmonary/Chest: Effort normal and breath sounds normal.  Musculoskeletal:  Normal Muscle Bulk and Muscle Testing Reveals: Upper Extremities: Full ROM and Muscle Strength 5/5 Lumbar Paraspinal Tenderness: L-4- L-5 Lower Extremities: Full ROM and Muscle Strength 5/5 Arises from Table with ease Narrow Based Gait  Neurological: He is alert and oriented to person, place, and time.  Skin: Skin is warm and dry.  Psychiatric: He has a normal mood and affect.  Nursing note and vitals reviewed.         Assessment & Plan:  1. Lumbar spinal stenosis, severe.  Refilled: MS Contin 100 mg one tablet three times a day #90 and MSIR 30 mg one tablet every 6 hours as needed for sever pain #120.  We will continue the opioid monitoring program, this consists of regular clinic visits, examinations, urine drug screen, pill counts as well as use of [place] Controlled Substance Reporting System. 2. Narcotic dependence: Continue opioid monitoring program. 3. Right Greater Trochanteric Bursitis: Continue with Ice Therapy alternating with heat: 3. Sleep apnea : compliant with CPAP  4. Constipation: Continue Miralax   20 minutes of face to face patient care time was spent during this visit. All questions were encouraged and answered.   F/U in 1 month

## 2016-01-31 ENCOUNTER — Encounter: Payer: Self-pay | Admitting: Cardiology

## 2016-02-03 ENCOUNTER — Encounter: Payer: Self-pay | Admitting: Neurology

## 2016-02-03 ENCOUNTER — Ambulatory Visit (INDEPENDENT_AMBULATORY_CARE_PROVIDER_SITE_OTHER): Payer: BLUE CROSS/BLUE SHIELD | Admitting: Neurology

## 2016-02-03 ENCOUNTER — Other Ambulatory Visit (INDEPENDENT_AMBULATORY_CARE_PROVIDER_SITE_OTHER): Payer: BLUE CROSS/BLUE SHIELD

## 2016-02-03 VITALS — BP 126/82 | HR 83 | Ht 72.0 in | Wt 214.0 lb

## 2016-02-03 DIAGNOSIS — Z720 Tobacco use: Secondary | ICD-10-CM

## 2016-02-03 DIAGNOSIS — R404 Transient alteration of awareness: Secondary | ICD-10-CM

## 2016-02-03 DIAGNOSIS — R413 Other amnesia: Secondary | ICD-10-CM

## 2016-02-03 DIAGNOSIS — F17210 Nicotine dependence, cigarettes, uncomplicated: Secondary | ICD-10-CM

## 2016-02-03 DIAGNOSIS — IMO0001 Reserved for inherently not codable concepts without codable children: Secondary | ICD-10-CM

## 2016-02-03 LAB — TSH: TSH: 0.73 u[IU]/mL (ref 0.35–4.50)

## 2016-02-03 LAB — VITAMIN B12: Vitamin B-12: 166 pg/mL — ABNORMAL LOW (ref 211–911)

## 2016-02-03 NOTE — Patient Instructions (Signed)
I don't appreciate any dementia.  Memory problems are likely related to medication.  We will check B12 and TSH.  To evaluating the staring spells, we will: 1.  Get MRI of brain 2.  Check EEG  Follow up after testing.

## 2016-02-03 NOTE — Progress Notes (Signed)
NEUROLOGY CONSULTATION NOTE  [name] MRN: [id number] DOB: Nov 18, 1960  Referring provider: Danella Sensing, NP Primary care provider: no PCP at this time  Reason for consult:  Memory deficits  HISTORY OF PRESENT ILLNESS: [name] is a 55 year old man with chronic back pain with chronic narcotic and benzodiazepine dependence, anxiety, CAD and CHF with [place] who presents for memory changes.  He is accompanied by his wife who supplements history.  He has a significant past medical history including cardiomyopathy and OSA.  He also has chronic back pain on long-term benzodiazepine and opiate dependence.  For several years, he has had transient episodes of staring spells.  He seems to "zone out" and has difficulty talking.  He may just repeat his wife's name.  Usually, he is conscious during these events but there were some episodes where he had no recollection.  It lasts about 10 to 15 minutes.  There are no convulsions, tongue biting, incontinence or postictal confusion.  It occurs once every 6 weeks.  He has had MRI of the brain with and without contrast performed on 04/06/08, which reportedly showed small vessel disease but no acute findings.  Carotid doppler was negative.  He reports one possible seizure that may have been provoked when using cocaine back in college.  Otherwise, no history of seizures.  No history of stroke.  No known family history of seizures.  For the past few years, he has had increased memory problems.  Specifically, he has trouble remembering events or conversations that occurred just two days ago.  He is independent cognitively in performing ADLs.  He manages his finances.  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  He does not drink alcohol.  He uses marijuana.  He has a JD but never practiced law.  His father passed away at age 34.  His mother did not have dementia.    Most recent carotid doppler from March showed no hemodynamically  significant stenosis.  CT of head from 02/25/14 was personally reviewed and was negative.  He does not drive.  Due to his health problems, he just does not feel well.  He also reports some stress.  PAST MEDICAL HISTORY: Past Medical History:  Diagnosis Date  . Anxiety   . Automatic implantable cardiac defibrillator St Judes    Analyze ST  . Benign prostatic hypertrophy   . Benzodiazepine dependence (HCC)    chronic  . CAD (coronary artery disease)    Last cath 2/12. 3-v CAD. Failed PCI of distal RCAc  CATH [facility] 4/13 with DES to  LAD X2  . CHF (congestive heart failure) ([place])   . Chronic back pain    lumbar stenosis  . Chronic systolic heart failure (HCC)    EF 20-25%. s/p ST. Jude ICD  . Depression   . DJD (degenerative joint disease)   . History of testicular cancer   . Narcotic dependence ([place])    chronic    PAST SURGICAL HISTORY: Past Surgical History:  Procedure Laterality Date  . ASD REPAIR, SINUS VENOSUS    . CARDIAC DEFIBRILLATOR PLACEMENT  08/2010  . HERNIA REPAIR    . LEFT HEART CATHETERIZATION WITH CORONARY ANGIOGRAM N/A 06/14/2014   Procedure: LEFT HEART CATHETERIZATION WITH CORONARY ANGIOGRAM;  Surgeon: Jolaine Artist, MD;  Location: [facility] CATH LAB;  Service: Cardiovascular;  Laterality: N/A;  . TESTICLE SURGERY     testicular cancer surgery    MEDICATIONS: Current Outpatient Prescriptions on File Prior  to Visit  Medication Sig Dispense Refill  . alfuzosin (UROXATRAL) 10 MG 24 hr tablet Take 10 mg by mouth as needed.     Marland Kitchen aspirin 325 MG tablet Take 325 mg by mouth daily.    . beclomethasone (QVAR) 40 MCG/ACT inhaler Inhale 2 puffs into the lungs 2 (two) times daily. 8.7 g 5  . carvedilol (COREG) 25 MG tablet Take 1 tablet (25 mg total) by mouth 2 (two) times daily. 180 tablet 2  . clopidogrel (PLAVIX) 75 MG tablet TAKE 1 TABLET EACH DAY. 90 tablet 2  . diazepam (VALIUM) 10 MG tablet Take 10 mg by mouth 2 (two) times daily.     Marland Kitchen ezetimibe (ZETIA) 10 MG  tablet TAKE 1 TABLET EACH DAY. 90 tablet 2  . lactulose (CHRONULAC) 10 GM/15ML solution Take by mouth. Reported on 10/07/2015    . lisinopril (PRINIVIL,ZESTRIL) 2.5 MG tablet Take 1 tablet (2.5 mg total) by mouth at bedtime. (Patient taking differently: Take 2.5 mg by mouth daily. ) 90 tablet 2  . LORazepam (ATIVAN) 1 MG tablet Take 1 mg by mouth 4 (four) times daily as needed. For anxiety    . morphine (MS CONTIN) 100 MG 12 hr tablet Take 1 tablet (100 mg total) by mouth 3 (three) times daily. 90 tablet 0  . morphine (MSIR) 30 MG tablet Take 1 tablet (30 mg total) by mouth every 6 (six) hours as needed for severe pain. 120 tablet 0  . nitroGLYCERIN (NITROSTAT) 0.4 MG SL tablet Place 0.4 mg under the tongue every 5 (five) minutes as needed for chest pain.    Marland Kitchen ondansetron (ZOFRAN-ODT) 8 MG disintegrating tablet Take 1 tablet (8 mg total) by mouth every 8 (eight) hours as needed for nausea. 30 tablet 0  . polyethylene glycol powder (GLYCOLAX/MIRALAX) powder MIX 1 CAPFUL TWICE DAILY AS NEEDED. 250 g 0  . rosuvastatin (CRESTOR) 40 MG tablet Take 1 tablet (40 mg total) by mouth daily. 90 tablet 2  . sildenafil (VIAGRA) 100 MG tablet TAKE ONE TABLET BY MOUTH AS NEEDED FOR ERECTILE DYSFUNCTION. 4 tablet 6  . spironolactone (ALDACTONE) 25 MG tablet TAKE (1/2) TABLET DAILY. 45 tablet 2  . testosterone cypionate (DEPOTESTOTERONE CYPIONATE) 200 MG/ML injection Inject into the muscle every 14 (fourteen) days.      Marland Kitchen torsemide (DEMADEX) 20 MG tablet Take 1 tablet (20 mg total) by mouth daily as needed. For swelling in legs 90 tablet 2  . traZODone (DESYREL) 100 MG tablet Take 100 mg by mouth at bedtime as needed for sleep.     Marland Kitchen zolpidem (AMBIEN) 10 MG tablet Take 1 tablet by mouth at bedtime as needed.  4   No current facility-administered medications on file prior to visit.     ALLERGIES: Allergies  Allergen Reactions  . Darvocet [Propoxyphene N-Acetaminophen] Anaphylaxis    Throat closes  .  Propoxyphene Anaphylaxis and Swelling  . Levaquin [Levofloxacin In D5w]     Joint aches    FAMILY HISTORY: Family History  Problem Relation Age of Onset  . Heart disease Father 70    Died of MI    SOCIAL HISTORY: Social History   Social History  . Marital status: Married    Spouse name: N/A  . Number of children: N/A  . Years of education: N/A   Occupational History  . Not on file.   Social History Main Topics  . Smoking status: Current Some Day Smoker    Packs/day: 1.50    Years: 29.00  Types: Cigarettes, E-cigarettes  . Smokeless tobacco: Never Used     Comment: PT STOPPED SMOKING CIGS 03/27/13. NOW CURRENTLY USING E-CIG and smoking a few cigerettes per week.  . Alcohol use No  . Drug use:     Types: Marijuana     Comment: Urine showed THC  . Sexual activity: Not on file   Other Topics Concern  . Not on file   Social History Narrative  . No narrative on file    REVIEW OF SYSTEMS: Constitutional: No fevers, chills, or sweats, no generalized fatigue, change in appetite Eyes: No visual changes, double vision, eye pain Ear, nose and throat: No hearing loss, ear pain, nasal congestion, sore throat Cardiovascular: No chest pain, palpitations Respiratory:  No shortness of breath at rest or with exertion, wheezes GastrointestinaI: No nausea, vomiting, diarrhea, abdominal pain, fecal incontinence Genitourinary:  No dysuria, urinary retention or frequency Musculoskeletal:  No neck pain, back pain Integumentary: No rash, pruritus, skin lesions Neurological: as above Psychiatric: No depression, insomnia, anxiety Endocrine: No palpitations, fatigue, diaphoresis, mood swings, change in appetite, change in weight, increased thirst Hematologic/Lymphatic:  No purpura, petechiae. Allergic/Immunologic: no itchy/runny eyes, nasal congestion, recent allergic reactions, rashes  PHYSICAL EXAM: Vitals:   02/03/16 1010  BP: 126/82  Pulse: 83   General: No acute distress.   Patient appears well-groomed.  Head:  Normocephalic/atraumatic Eyes:  fundi examined but not visualized Neck: supple, no paraspinal tenderness, full range of motion Back: No paraspinal tenderness Heart: regular rate and rhythm Lungs: Clear to auscultation bilaterally. Vascular: No carotid bruits. Neurological Exam: Mental status: alert and oriented to person, place, and time, 3/5 delayed recall, remote memory intact, fund of knowledge intact, attention and concentration intact, speech fluent and not dysarthric, language intact. Montreal Cognitive Assessment  02/03/2016  Visuospatial/ Executive (0/5) 5  Naming (0/3) 3  Attention: Read list of digits (0/2) 2  Attention: Read list of letters (0/1) 1  Attention: Serial 7 subtraction starting at 100 (0/3) 3  Language: Repeat phrase (0/2) 2  Language : Fluency (0/1) 1  Abstraction (0/2) 2  Delayed Recall (0/5) 3  Orientation (0/6) 6  Total 28  Adjusted Score (based on education) 28   Cranial nerves: CN I: not tested CN II: pupils equal, round and reactive to light, visual fields intact CN III, IV, VI:  full range of motion, no nystagmus, no ptosis CN V: facial sensation intact CN VII: upper and lower face symmetric CN VIII: hearing intact CN IX, X: gag intact, uvula midline CN XI: sternocleidomastoid and trapezius muscles intact CN XII: tongue midline Bulk & Tone: normal, no fasciculations. Motor:  5/5 throughout  Sensation:  Temperature sensation intact and vibration sensation reduced in toes. Deep Tendon Reflexes:  2+ throughout,  toes downgoing. Finger to nose testing:  Without dysmetria.  Heel to shin:  Without dysmetria.  Gait:  Normal station and stride.  Uses walker to help ambulate due to pain.  Romberg negative.  IMPRESSION: Memory deficits.  No cognitive impairment appreciated.  Likely related to pharmacologic effect. Staring spells.  Does not sound epileptic but will workup further Smoker  PLAN: 1.  Check B12 and  TSH  2.  Check MRI of brain and EEG 3.  Smoking cessation 4.  Follow up after testing.  Thank you for allowing me to take part in the care of this patient.  Metta Clines, DO  CC:  Danella Sensing, NP

## 2016-02-04 ENCOUNTER — Telehealth: Payer: Self-pay | Admitting: Physical Medicine & Rehabilitation

## 2016-02-04 ENCOUNTER — Telehealth: Payer: Self-pay

## 2016-02-04 NOTE — Telephone Encounter (Signed)
Patient needs an okay from [place] to fill his prescription early since he is going on vacation.  His appointment was moved from Aug. 10 to Aug. 3 at 3:00pm.  [facility] will fill if an approval is given to them.  Please call patient at [phone].

## 2016-02-04 NOTE — Telephone Encounter (Signed)
Placed a call to [name], no answer left message to return the call. Placed a call to [facility] and spoke to [place] the Pharmacist, MS Contin 100 mg and MSIR 30 mg was picked up on 01/23/2016. I need to speak to [name] to ask him about his vacation plans. At this time no early re-fill will be given.

## 2016-02-04 NOTE — Telephone Encounter (Signed)
-----   Message from Pieter Partridge, DO sent at 02/04/2016  7:29 AM EDT ----- B12 level is low.  This may contribute to memory loss.  Recommend supplementation with injections: 107mcg daily for 7 days, then weekly for 4 weeks, then monthly for 1 year.

## 2016-02-04 NOTE — Telephone Encounter (Signed)
Are you okay with pt filling meds early? Please advise.

## 2016-02-04 NOTE — Telephone Encounter (Signed)
Mychart message sent to patient. Will await his response.

## 2016-02-10 ENCOUNTER — Other Ambulatory Visit: Payer: Self-pay

## 2016-02-11 DIAGNOSIS — G4737 Central sleep apnea in conditions classified elsewhere: Secondary | ICD-10-CM | POA: Diagnosis not present

## 2016-02-11 DIAGNOSIS — G4733 Obstructive sleep apnea (adult) (pediatric): Secondary | ICD-10-CM | POA: Diagnosis not present

## 2016-02-13 ENCOUNTER — Encounter: Payer: BLUE CROSS/BLUE SHIELD | Attending: Physical Medicine & Rehabilitation | Admitting: Registered Nurse

## 2016-02-13 ENCOUNTER — Encounter: Payer: Self-pay | Admitting: Registered Nurse

## 2016-02-13 VITALS — BP 96/63 | HR 68

## 2016-02-13 DIAGNOSIS — R5381 Other malaise: Secondary | ICD-10-CM | POA: Insufficient documentation

## 2016-02-13 DIAGNOSIS — Z79899 Other long term (current) drug therapy: Secondary | ICD-10-CM

## 2016-02-13 DIAGNOSIS — F1120 Opioid dependence, uncomplicated: Secondary | ICD-10-CM | POA: Diagnosis not present

## 2016-02-13 DIAGNOSIS — Z5181 Encounter for therapeutic drug level monitoring: Secondary | ICD-10-CM | POA: Diagnosis not present

## 2016-02-13 DIAGNOSIS — M47817 Spondylosis without myelopathy or radiculopathy, lumbosacral region: Secondary | ICD-10-CM

## 2016-02-13 DIAGNOSIS — G894 Chronic pain syndrome: Secondary | ICD-10-CM

## 2016-02-13 DIAGNOSIS — G4733 Obstructive sleep apnea (adult) (pediatric): Secondary | ICD-10-CM | POA: Diagnosis not present

## 2016-02-13 MED ORDER — MORPHINE SULFATE ER 100 MG PO TBCR: 100.0000 mg | EXTENDED_RELEASE_TABLET | Freq: Three times a day (TID) | ORAL | 0 refills | Status: DC

## 2016-02-13 MED ORDER — MORPHINE SULFATE 30 MG PO TABS: 30.0000 mg | ORAL_TABLET | Freq: Four times a day (QID) | ORAL | 0 refills | Status: DC | PRN

## 2016-02-13 NOTE — Progress Notes (Signed)
Subjective:    Patient ID: [name], male    DOB: 16-Feb-1961, 55 y.o.   MRN: [id number]  HPI: [name] is a 55 year old male who returns for follow up for chronic pain and medication refill. He states his pain is located in his lower back radiating into his bilateral lower extremities posteriorly. He rate his pain 7.His current exercise regime is walking short distances using walker. [name] is leaving for vacation on Sunday August 6th for [facility], [name] wanted to fill his prescription prior to leaving for vacation. Placed a call to [facility] and spoke to [place], she checked the policy and reviewed the network. In [place] all analgesics [place] are only  [place] with  [place] providers. Mr.Lambright was given permission to pick up his prescription on Sunday August 6trh prior to leaving for vacation, he and his wife verbalized understanding. Also instructed to lock up analgesics and only to carry the right amount of analgesics and leave the rest locked up in the safe, they verbalized understanding.  Pain Inventory Average Pain 7 Pain Right Now 7 My pain is sharp, burning, dull, stabbing, tingling and aching  In the last 24 hours, has pain interfered with the following? General activity 6 Relation with others 7 Enjoyment of life 7 What TIME of day is your pain at its worst? all Sleep (in general) Poor  Pain is worse with: some activites Pain improves with: medication Relief from Meds: na  Mobility use a walker  Function employed # of hrs/week 30 I need assistance with the following:  feeding, dressing, meal prep, household duties and shopping  Neuro/Psych trouble walking spasms anxiety  Prior Studies Any changes since last visit?  no  Physicians involved in your care Any changes since last visit?  no   Family History  Problem Relation Age of Onset  . Heart disease Father 78    Died of [place]   Social History   Social History  .  Marital status: Married    Spouse name: N/A  . Number of children: N/A  . Years of education: N/A   Social History Main Topics  . Smoking status: Current Some Day Smoker    Packs/day: 1.50    Years: 29.00    Types: Cigarettes, E-cigarettes  . Smokeless tobacco: Never Used     Comment: PT STOPPED SMOKING CIGS 03/27/13. NOW CURRENTLY USING E-CIG and smoking a few cigerettes per week.  . Alcohol use No  . Drug use:     Types: Marijuana     Comment: Urine showed THC  . Sexual activity: Not Asked   Other Topics Concern  . None   Social History Narrative  . None   Past Surgical History:  Procedure Laterality Date  . ASD REPAIR, SINUS VENOSUS    . CARDIAC DEFIBRILLATOR PLACEMENT  08/2010  . HERNIA REPAIR    . LEFT HEART CATHETERIZATION WITH CORONARY ANGIOGRAM N/A 06/14/2014   Procedure: LEFT HEART CATHETERIZATION WITH CORONARY ANGIOGRAM;  Surgeon: Jolaine Artist, MD;  Location: [facility] CATH LAB;  Service: Cardiovascular;  Laterality: N/A;  . TESTICLE SURGERY     testicular cancer surgery   Past Medical History:  Diagnosis Date  . Anxiety   . Automatic implantable cardiac defibrillator [employer]    Analyze ST  . Benign prostatic hypertrophy   . Benzodiazepine dependence (HCC)    chronic  . CAD (coronary artery disease)    Last cath 2/12. 3-v CAD. Failed  PCI of distal RCAc  CATH [facility] 4/13 with DES to  LAD X2  . CHF (congestive heart failure) ([place])   . Chronic back pain    lumbar stenosis  . Chronic systolic heart failure (HCC)    EF 20-25%. s/p ST. Jude ICD  . Depression   . DJD (degenerative joint disease)   . History of testicular cancer   . Narcotic dependence (HCC)    chronic   BP 96/63 (BP Location: Left Arm, Patient Position: Sitting, Cuff Size: Large)   Pulse 68   SpO2 94%   Opioid Risk Score:   Fall Risk Score:  `1  Depression screen PHQ 2/9  Depression screen [facility] 2/9 02/13/2016 01/07/2016 12/26/2015 10/01/2015 06/16/2015 06/13/2015 03/13/2015  Decreased Interest 1  0 0 1 0 0 1  Down, Depressed, Hopeless 0 0 0 0 0 0 0  PHQ - 2 Score 1 0 0 1 0 0 1  Altered sleeping - - - - - - -  Tired, decreased energy - - - - - - -  Change in appetite - - - - - - -  Feeling bad or failure about yourself  - - - - - - -  Trouble concentrating - - - - - - -  Moving slowly or fidgety/restless - - - - - - -  Suicidal thoughts - - - - - - -  PHQ-9 Score - - - - - - -  Some recent data might be hidden    Review of Systems  Constitutional: Positive for unexpected weight change.  Respiratory: Positive for apnea.   All other systems reviewed and are negative.      Objective:   Physical Exam  Constitutional: He is oriented to person, place, and time. He appears well-developed and well-nourished.  HENT:  Head: Normocephalic and atraumatic.  Neck: Normal range of motion. Neck supple.  Cardiovascular: Normal rate and regular rhythm.   Pulmonary/Chest: Effort normal and breath sounds normal.  Musculoskeletal:  Normal Muscle Bulk and Muscle Testing Reveals: Upper Extremities: Full ROM and Muscle Strength 5/5 Lumbar paraspinal Tenderness: L-3- L-5 Lower Extremities: Full ROM and Muscle Strength 5/5 Arises from chair with ease using walker for support Narrow Based Gait  Neurological: He is alert and oriented to person, place, and time.  Skin: Skin is warm and dry.  Psychiatric: He has a normal mood and affect.  Nursing note and vitals reviewed.         Assessment & Plan:  1. Lumbar spinal stenosis, severe.  Refilled: MS Contin 100 mg one tablet three times a day #90 and MSIR 30 mg one tablet every 6 hours as needed for sever pain #120.  We will continue the opioid monitoring program, this consists of regular clinic visits, examinations, urine drug screen, pill counts as well as use of [place] Controlled Substance Reporting System. 2. Narcotic dependence: Continue opioid monitoring program. 3. Right Greater Trochanteric Bursitis: No complaints  today.Continue with Ice Therapy alternating with heat: 3. Sleep apnea : compliant with CPAP  4. Constipation: Continue Miralax   20 minutes of face to face patient care time was spent during this visit. All questions were encouraged and answered.   F/U in 1 month

## 2016-02-20 ENCOUNTER — Ambulatory Visit: Payer: Self-pay | Admitting: Registered Nurse

## 2016-03-02 ENCOUNTER — Ambulatory Visit: Payer: Self-pay | Admitting: Physical Medicine & Rehabilitation

## 2016-03-13 DIAGNOSIS — G4733 Obstructive sleep apnea (adult) (pediatric): Secondary | ICD-10-CM | POA: Diagnosis not present

## 2016-03-13 DIAGNOSIS — G4737 Central sleep apnea in conditions classified elsewhere: Secondary | ICD-10-CM | POA: Diagnosis not present

## 2016-03-18 ENCOUNTER — Encounter (HOSPITAL_COMMUNITY): Payer: Self-pay

## 2016-03-18 ENCOUNTER — Emergency Department (HOSPITAL_COMMUNITY): Payer: No Typology Code available for payment source

## 2016-03-18 ENCOUNTER — Emergency Department (HOSPITAL_COMMUNITY)
Admission: EM | Admit: 2016-03-18 | Discharge: 2016-03-18 | Disposition: A | Discharge status: Home / Self Care | Payer: No Typology Code available for payment source | Attending: Emergency Medicine | Admitting: Emergency Medicine

## 2016-03-18 ENCOUNTER — Encounter: Payer: BLUE CROSS/BLUE SHIELD | Admitting: Registered Nurse

## 2016-03-18 DIAGNOSIS — F17210 Nicotine dependence, cigarettes, uncomplicated: Secondary | ICD-10-CM | POA: Diagnosis not present

## 2016-03-18 DIAGNOSIS — S0181XA Laceration without foreign body of other part of head, initial encounter: Secondary | ICD-10-CM | POA: Diagnosis not present

## 2016-03-18 DIAGNOSIS — Y92410 Unspecified street and highway as the place of occurrence of the external cause: Secondary | ICD-10-CM | POA: Insufficient documentation

## 2016-03-18 DIAGNOSIS — S0101XA Laceration without foreign body of scalp, initial encounter: Secondary | ICD-10-CM | POA: Diagnosis not present

## 2016-03-18 DIAGNOSIS — Z7902 Long term (current) use of antithrombotics/antiplatelets: Secondary | ICD-10-CM | POA: Insufficient documentation

## 2016-03-18 DIAGNOSIS — S098XXA Other specified injuries of head, initial encounter: Secondary | ICD-10-CM | POA: Diagnosis not present

## 2016-03-18 DIAGNOSIS — R079 Chest pain, unspecified: Secondary | ICD-10-CM | POA: Diagnosis not present

## 2016-03-18 DIAGNOSIS — S199XXA Unspecified injury of neck, initial encounter: Secondary | ICD-10-CM | POA: Diagnosis not present

## 2016-03-18 DIAGNOSIS — IMO0002 Reserved for concepts with insufficient information to code with codable children: Secondary | ICD-10-CM

## 2016-03-18 DIAGNOSIS — Y999 Unspecified external cause status: Secondary | ICD-10-CM | POA: Insufficient documentation

## 2016-03-18 DIAGNOSIS — Z7982 Long term (current) use of aspirin: Secondary | ICD-10-CM | POA: Diagnosis not present

## 2016-03-18 DIAGNOSIS — Z8547 Personal history of malignant neoplasm of testis: Secondary | ICD-10-CM | POA: Insufficient documentation

## 2016-03-18 DIAGNOSIS — I5022 Chronic systolic (congestive) heart failure: Secondary | ICD-10-CM | POA: Insufficient documentation

## 2016-03-18 DIAGNOSIS — I2510 Atherosclerotic heart disease of native coronary artery without angina pectoris: Secondary | ICD-10-CM | POA: Diagnosis not present

## 2016-03-18 DIAGNOSIS — Y939 Activity, unspecified: Secondary | ICD-10-CM | POA: Insufficient documentation

## 2016-03-18 DIAGNOSIS — R55 Syncope and collapse: Secondary | ICD-10-CM | POA: Diagnosis not present

## 2016-03-18 LAB — CBC WITH DIFFERENTIAL/PLATELET
Basophils Absolute: 0 10*3/uL (ref 0.0–0.1)
Basophils Relative: 0 %
Eosinophils Absolute: 0.3 10*3/uL (ref 0.0–0.7)
Eosinophils Relative: 4 %
HCT: 49.1 % (ref 39.0–52.0)
Hemoglobin: 17.4 g/dL — ABNORMAL HIGH (ref 13.0–17.0)
Lymphocytes Relative: 20 %
Lymphs Abs: 1.4 10*3/uL (ref 0.7–4.0)
MCH: 33.5 pg (ref 26.0–34.0)
MCHC: 35.4 g/dL (ref 30.0–36.0)
MCV: 94.6 fL (ref 78.0–100.0)
Monocytes Absolute: 0.6 10*3/uL (ref 0.1–1.0)
Monocytes Relative: 8 %
Neutro Abs: 4.7 10*3/uL (ref 1.7–7.7)
Neutrophils Relative %: 68 %
Platelets: 123 10*3/uL — ABNORMAL LOW (ref 150–400)
RBC: 5.19 MIL/uL (ref 4.22–5.81)
RDW: 13.2 % (ref 11.5–15.5)
WBC: 7 10*3/uL (ref 4.0–10.5)

## 2016-03-18 MED ORDER — HYDROMORPHONE HCL 1 MG/ML IJ SOLN: 1.0000 mg | Freq: Once | INTRAMUSCULAR | Status: DC

## 2016-03-18 MED ORDER — LIDOCAINE-EPINEPHRINE (PF) 2 %-1:200000 IJ SOLN
20.0000 mL | Freq: Once | INTRAMUSCULAR | Status: AC
  Administered 2016-03-18: 20 mL
  Filled 2016-03-18: qty 20

## 2016-03-18 MED ORDER — HYDROMORPHONE HCL 1 MG/ML IJ SOLN
1.0000 mg | Freq: Once | INTRAMUSCULAR | Status: AC
  Administered 2016-03-18: 1 mg via INTRAVENOUS
  Filled 2016-03-18: qty 1
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
## 2016-03-18 NOTE — Discharge Instructions (Signed)
Read the information below.   Your imaging and labs were re-assuring.  You may feel sore for the next 2-3 days.  Your laceration was sutured.  Keep clean and dry. After 24 hours you can wash with warm soap and water. Apply antibiotic ointment to laceration daily.  Visit your PCP or ED in 2-3 days for a wound re-check. Sutures will need to be removed in approximately 10 days.  Look for signs of infection - redness, swelling, warmth, purulent drainage, or fever. If any of these develop return to ED immediately.  Take your prescribed medications for pain relief. You can also try muscle relaxers. Apply ice to affected areas. Warm showers may soothe achy muscles.  You may return to the Emergency Department at any time for worsening condition or any new symptoms that concern you. Return to ED if you develop fever, changes in mental status, loss of consciousness, vomiting, chest pain, or shortness of breath, or blood in urine.

## 2016-03-18 NOTE — ED Notes (Signed)
Approx one inch laceration noted to posterior right head. Bleeding controlled at this time. Laceration cleansed thoroughly with betadine and sterile NS.
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
## 2016-03-18 NOTE — ED Notes (Signed)
Patient transported to CT via stretcher. Posterior head lac dressing in place, bleeding controlled at this time.
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
## 2016-03-18 NOTE — ED Notes (Signed)
Ambulated pt. No dizziness or trouble ambulating.

## 2016-03-18 NOTE — ED Notes (Signed)
[employer]. Judes defib interrogated.

## 2016-03-18 NOTE — ED Notes (Signed)
Pt d/c home via w/c with significant other

## 2016-03-19 ENCOUNTER — Encounter: Payer: BLUE CROSS/BLUE SHIELD | Attending: Physical Medicine & Rehabilitation | Admitting: Registered Nurse

## 2016-03-19 ENCOUNTER — Encounter: Payer: Self-pay | Admitting: Registered Nurse

## 2016-03-19 VITALS — BP 145/91 | HR 70 | Resp 14

## 2016-03-19 DIAGNOSIS — M47817 Spondylosis without myelopathy or radiculopathy, lumbosacral region: Secondary | ICD-10-CM | POA: Diagnosis not present

## 2016-03-19 DIAGNOSIS — G894 Chronic pain syndrome: Secondary | ICD-10-CM | POA: Diagnosis not present

## 2016-03-19 DIAGNOSIS — Z5181 Encounter for therapeutic drug level monitoring: Secondary | ICD-10-CM | POA: Diagnosis not present

## 2016-03-19 DIAGNOSIS — Z79899 Other long term (current) drug therapy: Secondary | ICD-10-CM

## 2016-03-19 DIAGNOSIS — R5381 Other malaise: Secondary | ICD-10-CM | POA: Insufficient documentation

## 2016-03-19 DIAGNOSIS — F1120 Opioid dependence, uncomplicated: Secondary | ICD-10-CM

## 2016-03-19 MED ORDER — MORPHINE SULFATE ER 100 MG PO TBCR: 100.0000 mg | EXTENDED_RELEASE_TABLET | Freq: Three times a day (TID) | ORAL | 0 refills | Status: DC

## 2016-03-19 MED ORDER — MORPHINE SULFATE 30 MG PO TABS: 30.0000 mg | ORAL_TABLET | Freq: Four times a day (QID) | ORAL | 0 refills | Status: DC | PRN
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
## 2016-03-27 LAB — TOXASSURE SELECT,+ANTIDEPR,UR

## 2016-03-27 LAB — 6-ACETYLMORPHINE,TOXASSURE ADD
6-ACETYLMORPHINE: NEGATIVE
6-acetylmorphine: NOT DETECTED ng/mg creat

## 2016-04-01 NOTE — Progress Notes (Signed)
Urine drug screen for this encounter is consistent for prescribed medications.   

## 2016-04-08 ENCOUNTER — Ambulatory Visit (INDEPENDENT_AMBULATORY_CARE_PROVIDER_SITE_OTHER): Payer: BLUE CROSS/BLUE SHIELD | Admitting: Physician Assistant

## 2016-04-08 VITALS — BP 90/68 | HR 64 | Temp 97.8°F | Resp 20 | Ht 71.0 in | Wt 211.4 lb

## 2016-04-08 DIAGNOSIS — N390 Urinary tract infection, site not specified: Secondary | ICD-10-CM

## 2016-04-08 DIAGNOSIS — S0100XA Unspecified open wound of scalp, initial encounter: Secondary | ICD-10-CM | POA: Diagnosis not present

## 2016-04-08 DIAGNOSIS — R42 Dizziness and giddiness: Secondary | ICD-10-CM

## 2016-04-08 DIAGNOSIS — R82998 Other abnormal findings in urine: Secondary | ICD-10-CM

## 2016-04-08 LAB — POCT CBC
Granulocyte percent: 60.7 %G (ref 37–80)
HCT, POC: 48.6 % (ref 43.5–53.7)
Hemoglobin: 17.2 g/dL (ref 14.1–18.1)
Lymph, poc: 1.8 (ref 0.6–3.4)
MCH, POC: 34.2 pg — AB (ref 27–31.2)
MCHC: 35.5 g/dL — AB (ref 31.8–35.4)
MCV: 96.4 fL (ref 80–97)
MID (cbc): 0.5 (ref 0–0.9)
MPV: 7.3 fL (ref 0–99.8)
POC Granulocyte: 3.5 (ref 2–6.9)
POC LYMPH PERCENT: 31.3 %L (ref 10–50)
POC MID %: 8 %M (ref 0–12)
Platelet Count, POC: 196 10*3/uL (ref 142–424)
RBC: 5.04 M/uL (ref 4.69–6.13)
RDW, POC: 15 %
WBC: 5.8 10*3/uL (ref 4.6–10.2)

## 2016-04-08 LAB — POC MICROSCOPIC URINALYSIS (UMFC): Mucus: ABSENT

## 2016-04-08 LAB — POCT URINALYSIS DIP (MANUAL ENTRY)
Bilirubin, UA: NEGATIVE
Glucose, UA: NEGATIVE
Ketones, POC UA: NEGATIVE
Nitrite, UA: NEGATIVE
Protein Ur, POC: NEGATIVE
Spec Grav, UA: 1.015
Urobilinogen, UA: 0.2
pH, UA: 7

## 2016-04-08 MED ORDER — CIPROFLOXACIN HCL 500 MG PO TABS: 500.0000 mg | ORAL_TABLET | Freq: Two times a day (BID) | ORAL | 0 refills | Status: DC

## 2016-04-08 NOTE — Patient Instructions (Signed)
     IF you received an x-ray today, you will receive an invoice from [facility]. Please contact [facility] at [phone] with questions or concerns regarding your invoice.   IF you received labwork today, you will receive an invoice from [facility]. Please contact [facility] at [phone] with questions or concerns regarding your invoice.   Our billing staff will not be able to assist you with questions regarding bills from these companies.  You will be contacted with the lab results as soon as they are available. The fastest way to get your results is to activate your My Chart account. Instructions are located on the last page of this paperwork. If you have not heard from us regarding the results in 2 weeks, please contact this office.      
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
## 2016-04-09 ENCOUNTER — Other Ambulatory Visit: Payer: Self-pay | Admitting: Physician Assistant

## 2016-04-09 DIAGNOSIS — Z113 Encounter for screening for infections with a predominantly sexual mode of transmission: Secondary | ICD-10-CM | POA: Diagnosis not present

## 2016-04-09 DIAGNOSIS — N390 Urinary tract infection, site not specified: Secondary | ICD-10-CM | POA: Diagnosis not present

## 2016-04-09 LAB — URINE CULTURE: Organism ID, Bacteria: NO GROWTH

## 2016-04-10 DIAGNOSIS — F4322 Adjustment disorder with anxiety: Secondary | ICD-10-CM | POA: Diagnosis not present

## 2016-04-10 LAB — GC/CHLAMYDIA PROBE AMP
CT Probe RNA: NOT DETECTED
GC Probe RNA: NOT DETECTED

## 2016-04-10 LAB — TRICHOMONAS VAGINALIS RNA, QL,MALES: Trichomonas vaginalis RNA: NOT DETECTED

## 2016-04-12 DIAGNOSIS — G4737 Central sleep apnea in conditions classified elsewhere: Secondary | ICD-10-CM | POA: Diagnosis not present

## 2016-04-12 DIAGNOSIS — G4733 Obstructive sleep apnea (adult) (pediatric): Secondary | ICD-10-CM | POA: Diagnosis not present

## 2016-04-13 ENCOUNTER — Telehealth: Payer: Self-pay | Admitting: *Deleted

## 2016-04-13 NOTE — Telephone Encounter (Signed)
He is prescribed Diazepam by [employer]

## 2016-04-13 NOTE — Telephone Encounter (Signed)
-----   Message from Charlett Blake, MD sent at 04/08/2016 10:12 AM EDT ----- Please check if patient has prescription for diazepam

## 2016-04-14 ENCOUNTER — Encounter: Payer: Self-pay | Admitting: Physical Medicine & Rehabilitation

## 2016-04-14 ENCOUNTER — Encounter: Payer: BLUE CROSS/BLUE SHIELD | Attending: Physical Medicine & Rehabilitation

## 2016-04-14 ENCOUNTER — Ambulatory Visit (HOSPITAL_BASED_OUTPATIENT_CLINIC_OR_DEPARTMENT_OTHER): Payer: BLUE CROSS/BLUE SHIELD | Admitting: Physical Medicine & Rehabilitation

## 2016-04-14 VITALS — BP 106/77 | HR 74 | Resp 14

## 2016-04-14 DIAGNOSIS — M25551 Pain in right hip: Secondary | ICD-10-CM | POA: Diagnosis not present

## 2016-04-14 DIAGNOSIS — M47817 Spondylosis without myelopathy or radiculopathy, lumbosacral region: Secondary | ICD-10-CM | POA: Diagnosis not present

## 2016-04-14 DIAGNOSIS — R5381 Other malaise: Secondary | ICD-10-CM | POA: Insufficient documentation

## 2016-04-14 DIAGNOSIS — K5903 Drug induced constipation: Secondary | ICD-10-CM | POA: Diagnosis not present

## 2016-04-14 DIAGNOSIS — T402X5A Adverse effect of other opioids, initial encounter: Secondary | ICD-10-CM

## 2016-04-14 DIAGNOSIS — R2689 Other abnormalities of gait and mobility: Secondary | ICD-10-CM

## 2016-04-14 DIAGNOSIS — F1320 Sedative, hypnotic or anxiolytic dependence, uncomplicated: Secondary | ICD-10-CM

## 2016-04-14 MED ORDER — MORPHINE SULFATE ER 100 MG PO TBCR: 100.0000 mg | EXTENDED_RELEASE_TABLET | Freq: Three times a day (TID) | ORAL | 0 refills | Status: DC

## 2016-04-14 MED ORDER — MORPHINE SULFATE 30 MG PO TABS: 30.0000 mg | ORAL_TABLET | Freq: Four times a day (QID) | ORAL | 0 refills | Status: DC | PRN
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
## 2016-04-14 NOTE — Patient Instructions (Signed)
Physical therapy will call you to set up an appointment to come in to work on your balance as well as right hip pain.

## 2016-04-15 ENCOUNTER — Telehealth: Payer: Self-pay | Admitting: Physical Medicine & Rehabilitation

## 2016-04-15 NOTE — Telephone Encounter (Signed)
Patient is needing his morphine filled early because he is going out of town this morning.  Also [facility] is calling about this prescription as well.  Kim with [facility] stated patient has gotten his prescriptions filled early 7/13, 8/6 and 9/7 and they have a 2 day early policy.  Please call Maudie Mercury at [facility] and Gorman Safi back to see if this can get filled early.

## 2016-04-15 NOTE — Telephone Encounter (Signed)
I spoke with [name] at [facility] and told her that we will not approve early refills.

## 2016-04-24 ENCOUNTER — Telehealth: Payer: Self-pay

## 2016-04-24 ENCOUNTER — Encounter: Payer: Self-pay | Admitting: Pulmonary Disease

## 2016-04-24 ENCOUNTER — Ambulatory Visit (INDEPENDENT_AMBULATORY_CARE_PROVIDER_SITE_OTHER): Payer: BLUE CROSS/BLUE SHIELD | Admitting: Pulmonary Disease

## 2016-04-24 VITALS — BP 102/62 | HR 70 | Ht 72.0 in | Wt 231.6 lb

## 2016-04-24 DIAGNOSIS — J45909 Unspecified asthma, uncomplicated: Secondary | ICD-10-CM | POA: Diagnosis not present

## 2016-04-24 DIAGNOSIS — J0101 Acute recurrent maxillary sinusitis: Secondary | ICD-10-CM

## 2016-04-24 DIAGNOSIS — G4739 Other sleep apnea: Secondary | ICD-10-CM

## 2016-04-24 DIAGNOSIS — G4731 Primary central sleep apnea: Secondary | ICD-10-CM | POA: Diagnosis not present

## 2016-04-24 DIAGNOSIS — Z720 Tobacco use: Secondary | ICD-10-CM | POA: Diagnosis not present

## 2016-04-24 DIAGNOSIS — J411 Mucopurulent chronic bronchitis: Secondary | ICD-10-CM

## 2016-04-24 MED ORDER — TRIAMCINOLONE ACETONIDE 55 MCG/ACT NA AERO: 1.0000 | INHALATION_SPRAY | Freq: Every day | NASAL | 12 refills | Status: DC

## 2016-04-24 MED ORDER — PREDNISONE 10 MG PO TABS: ORAL_TABLET | ORAL | 0 refills | Status: DC

## 2016-04-24 MED ORDER — AMOXICILLIN-POT CLAVULANATE 875-125 MG PO TABS: 1.0000 | ORAL_TABLET | Freq: Two times a day (BID) | ORAL | 1 refills | Status: DC

## 2016-04-24 NOTE — Patient Instructions (Signed)
Saline nasal spray daily Nasacort 1 spray each nostril daily Augmentin 1 pill twice per day >> take for 1 week, and if sinuses not better then refill for an additional 1 week Prednisone 10 mg pill >> use as directed Stop using afrin  Follow up in 6 to 8 weeks with Dr. Halford Chessman

## 2016-04-24 NOTE — Telephone Encounter (Signed)
Pt called to get EEG and MRI from June completed. EEG scheduled for 04/30/16. MRI is not able to be done (Per [facility] and [facility]) due to type of pace maker pt has. MRI brain W/o Contrast had been ordered.    Pace Maker info: [employer] Serial Number [id number] Pulse Gen Seriel Number [id number]

## 2016-04-24 NOTE — Telephone Encounter (Signed)
Recent CT of head was done.  I would proceed with EEG and follow up afterwards.

## 2016-04-24 NOTE — Progress Notes (Signed)
Current Outpatient Prescriptions on File Prior to Visit  Medication Sig  . alfuzosin (UROXATRAL) 10 MG 24 hr tablet Take 10 mg by mouth as needed. For UTI  . aspirin 325 MG tablet Take 325 mg by mouth daily.  . beclomethasone (QVAR) 40 MCG/ACT inhaler Inhale 2 puffs into the lungs 2 (two) times daily.  . carvedilol (COREG) 25 MG tablet Take 1 tablet (25 mg total) by mouth 2 (two) times daily.  . clopidogrel (PLAVIX) 75 MG tablet TAKE 1 TABLET EACH DAY.  . diazepam (VALIUM) 10 MG tablet Take 10 mg by mouth 2 (two) times daily.   Marland Kitchen ezetimibe (ZETIA) 10 MG tablet TAKE 1 TABLET EACH DAY.  Marland Kitchen lactulose (CHRONULAC) 10 GM/15ML solution Take by mouth. Reported on 10/07/2015  . lisinopril (PRINIVIL,ZESTRIL) 2.5 MG tablet Take 1 tablet (2.5 mg total) by mouth at bedtime. (Patient taking differently: Take 2.5 mg by mouth daily. )  . LORazepam (ATIVAN) 1 MG tablet Take 1 mg by mouth 4 (four) times daily as needed. For anxiety  . morphine (MS CONTIN) 100 MG 12 hr tablet Take 1 tablet (100 mg total) by mouth 3 (three) times daily.  Marland Kitchen morphine (MSIR) 30 MG tablet Take 1 tablet (30 mg total) by mouth every 6 (six) hours as needed for severe pain.  . nitroGLYCERIN (NITROSTAT) 0.4 MG SL tablet Place 0.4 mg under the tongue every 5 (five) minutes as needed for chest pain.  Marland Kitchen ondansetron (ZOFRAN-ODT) 8 MG disintegrating tablet Take 1 tablet (8 mg total) by mouth every 8 (eight) hours as needed for nausea.  . polyethylene glycol powder (GLYCOLAX/MIRALAX) powder MIX 1 CAPFUL TWICE DAILY AS NEEDED.  Marland Kitchen rosuvastatin (CRESTOR) 40 MG tablet Take 1 tablet (40 mg total) by mouth daily.  . sildenafil (VIAGRA) 100 MG tablet TAKE ONE TABLET BY MOUTH AS NEEDED FOR ERECTILE DYSFUNCTION.  Marland Kitchen spironolactone (ALDACTONE) 25 MG tablet TAKE (1/2) TABLET DAILY.  Marland Kitchen testosterone cypionate (DEPOTESTOTERONE CYPIONATE) 200 MG/ML injection Inject into the muscle every 14 (fourteen) days.    Marland Kitchen torsemide (DEMADEX) 20 MG tablet Take 1 tablet (20  mg total) by mouth daily as needed. For swelling in legs  . traZODone (DESYREL) 100 MG tablet Take 100 mg by mouth at bedtime as needed for sleep.   Marland Kitchen zolpidem (AMBIEN) 10 MG tablet Take 1 tablet by mouth at bedtime as needed.   No current facility-administered medications on file prior to visit.      Chief Complaint  Patient presents with  . Acute Visit    Pt having increased cough x 3-4 weeks. Taking Amoxicillin BID. Pt states that his cough is productive - green with some black spots. Pt also c/o SOB and near syncope spells. Pt has very unsteady gait today.. Pt very congested today - head congestion    Sleep tests PSG 10/14/12 >> AHI 63  Pulmonary tests PFT 11/08/13 >> FEV1 4.23 (103%), FEV1% 79, TLC 6.67 (90%), DLCO 102% CT chest 11/05/15 >> atherosclerosis, air-fluid level in esophagus  Cardiac tests Echo 05/24/12 > EF 30 to 35%  Past medical history CAD, systolic CHF, s/p AICD, Anxiety, BPH, Back pain, Testicular cancer  Past surgical history, Family history, Social history, Allergies reviewed  Vital Signs BP 102/62 (BP Location: Left Arm, Cuff Size: Normal)   Pulse 70   Ht 6' (1.829 m)   Wt 231 lb 9.6 oz (105.1 kg)   SpO2 98%   BMI 31.41 kg/m   History of Present Illness [name] is a  55 y.o. male with smoker with complex sleep apnea, and chronic bronchitis.  He was seen previously by Dr. Gwenette Greet and Dr. Joya Gaskins.  He is [facility] at [facility] for [facility]ology.  He has chronic sinus congestion.  He uses afrin daily.  He has more sinus drainage and ear congestion.  He is coughing up yellow sputum, and getting wheezing.  He feels dizzy when he walks.  He uses Qvar and this helps.  He is using 5 liters oxygen at night with ?CPAP or ASV.  Physical Exam  General - No distress, nasal voice ENT - tender in maxillary sinus area b/l, yellow nasal drainage, no oral exudate, no LAN Cardiac - s1s2 regular, no murmur Chest - b/l crackles and wheeze >> partially clear with  coughing Back - No focal tenderness Abd - Soft, non-tender Ext - No edema Neuro - Normal strength Skin - No rashes Psych - normal mood, and behavior  Assessment/Plan  Acute sinusitis with asthmatic bronchitis. - augmentin, prednisone - nasal irrigation, nasacort - stop afrin  Chronic mucopurulent bronchitis. - continue Qvar  Tobacco abuse. - discussed options to help with smoking cessation  Complex sleep apnea. - will need to determine appropriate set up for him at next visit  Chronic respiratory failure with hx of polycythemia. - continue 5 liters oxygen at night for now   Patient Instructions  Saline nasal spray daily Nasacort 1 spray each nostril daily Augmentin 1 pill twice per day >> take for 1 week, and if sinuses not better then refill for an additional 1 week Prednisone 10 mg pill >> use as directed Stop using afrin  Follow up in 6 to 8 weeks with Dr. Beckey Rutter, MD [facility] Pager:  [phone] 04/24/2016, 5:38 PM

## 2016-04-27 ENCOUNTER — Encounter: Payer: Self-pay | Admitting: *Deleted

## 2016-04-30 ENCOUNTER — Other Ambulatory Visit: Payer: Self-pay

## 2016-05-04 ENCOUNTER — Telehealth: Payer: Self-pay

## 2016-05-04 DIAGNOSIS — E538 Deficiency of other specified B group vitamins: Secondary | ICD-10-CM

## 2016-05-04 NOTE — Telephone Encounter (Signed)
We can recheck B12 level.  It probably makes more sense to follow up after the EEG.

## 2016-05-04 NOTE — Telephone Encounter (Signed)
Pt called to see if he should keep appt for tomorrow as he missed his EEG appointment? Please advise.   Pt also questioning B12 results done in July. Wanted to know why he was not made aware of results. Advised that 2 vm had been left requesting he call us back, as well as results being released via mychart. Pt wants to know if he can start B12 now? Or should another level be checked? Please advise.   Pt would like to home administer as he also self administers Testerone injections.

## 2016-05-04 NOTE — Telephone Encounter (Signed)
Message relayed to patient. Verbalized understanding and denied questions. B12 order placed. Pt will reschedule EEG.

## 2016-05-05 ENCOUNTER — Other Ambulatory Visit: Payer: Self-pay | Admitting: Pulmonary Disease

## 2016-05-05 ENCOUNTER — Ambulatory Visit: Payer: Self-pay | Admitting: Neurology

## 2016-05-07 ENCOUNTER — Telehealth: Payer: Self-pay | Admitting: Cardiology

## 2016-05-07 NOTE — Telephone Encounter (Signed)
LMOVM requesting that pt send manual transmission b/c home monitor has not updated in at least 7 days.    

## 2016-05-11 DIAGNOSIS — I2510 Atherosclerotic heart disease of native coronary artery without angina pectoris: Secondary | ICD-10-CM | POA: Diagnosis not present

## 2016-05-11 DIAGNOSIS — Z23 Encounter for immunization: Secondary | ICD-10-CM | POA: Diagnosis not present

## 2016-05-11 DIAGNOSIS — R55 Syncope and collapse: Secondary | ICD-10-CM | POA: Diagnosis not present

## 2016-05-11 DIAGNOSIS — I5022 Chronic systolic (congestive) heart failure: Secondary | ICD-10-CM | POA: Diagnosis not present

## 2016-05-12 ENCOUNTER — Encounter: Payer: Self-pay | Admitting: Physical Medicine & Rehabilitation

## 2016-05-12 ENCOUNTER — Ambulatory Visit (HOSPITAL_BASED_OUTPATIENT_CLINIC_OR_DEPARTMENT_OTHER): Payer: BLUE CROSS/BLUE SHIELD | Admitting: Physical Medicine & Rehabilitation

## 2016-05-12 VITALS — BP 121/79 | HR 70 | Resp 14

## 2016-05-12 DIAGNOSIS — R413 Other amnesia: Secondary | ICD-10-CM | POA: Diagnosis not present

## 2016-05-12 DIAGNOSIS — M48062 Spinal stenosis, lumbar region with neurogenic claudication: Secondary | ICD-10-CM | POA: Diagnosis not present

## 2016-05-12 DIAGNOSIS — F0781 Postconcussional syndrome: Secondary | ICD-10-CM

## 2016-05-12 DIAGNOSIS — R5381 Other malaise: Secondary | ICD-10-CM | POA: Diagnosis not present

## 2016-05-12 MED ORDER — MORPHINE SULFATE ER 100 MG PO TBCR: 100.0000 mg | EXTENDED_RELEASE_TABLET | Freq: Three times a day (TID) | ORAL | 0 refills | Status: DC

## 2016-05-12 MED ORDER — MORPHINE SULFATE 30 MG PO TABS: 30.0000 mg | ORAL_TABLET | Freq: Four times a day (QID) | ORAL | 0 refills | Status: DC | PRN

## 2016-05-12 NOTE — Patient Instructions (Signed)
Have made referral to speech therapy for concussion  Referral for Neurology re eval  Once we hire Neuropsychologist , would recommend eval
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
## 2016-05-13 ENCOUNTER — Encounter: Payer: Self-pay | Admitting: Internal Medicine

## 2016-05-13 DIAGNOSIS — G4737 Central sleep apnea in conditions classified elsewhere: Secondary | ICD-10-CM | POA: Diagnosis not present

## 2016-05-13 DIAGNOSIS — G4733 Obstructive sleep apnea (adult) (pediatric): Secondary | ICD-10-CM | POA: Diagnosis not present

## 2016-05-15 DIAGNOSIS — G4733 Obstructive sleep apnea (adult) (pediatric): Secondary | ICD-10-CM | POA: Diagnosis not present

## 2016-05-18 ENCOUNTER — Other Ambulatory Visit: Payer: Self-pay
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
## 2016-05-25 ENCOUNTER — Encounter: Payer: Self-pay | Admitting: Physician Assistant

## 2016-05-26 ENCOUNTER — Encounter: Payer: Self-pay | Admitting: Physician Assistant

## 2016-05-29 NOTE — Progress Notes (Signed)
Normal.  Letter.

## 2016-06-03 DIAGNOSIS — I861 Scrotal varices: Secondary | ICD-10-CM | POA: Diagnosis not present

## 2016-06-03 DIAGNOSIS — N5089 Other specified disorders of the male genital organs: Secondary | ICD-10-CM | POA: Diagnosis not present

## 2016-06-03 DIAGNOSIS — N4340 Spermatocele of epididymis, unspecified: Secondary | ICD-10-CM | POA: Diagnosis not present

## 2016-06-03 DIAGNOSIS — N492 Inflammatory disorders of scrotum: Secondary | ICD-10-CM | POA: Diagnosis not present

## 2016-06-11 ENCOUNTER — Encounter: Payer: Self-pay | Admitting: Registered Nurse

## 2016-06-11 ENCOUNTER — Encounter: Payer: BLUE CROSS/BLUE SHIELD | Attending: Physical Medicine & Rehabilitation | Admitting: Registered Nurse

## 2016-06-11 ENCOUNTER — Other Ambulatory Visit (HOSPITAL_COMMUNITY): Payer: Self-pay | Admitting: Internal Medicine

## 2016-06-11 ENCOUNTER — Telehealth: Payer: Self-pay

## 2016-06-11 ENCOUNTER — Ambulatory Visit: Payer: BLUE CROSS/BLUE SHIELD | Admitting: Neurology

## 2016-06-11 VITALS — BP 103/70 | HR 75 | Resp 14

## 2016-06-11 DIAGNOSIS — M25511 Pain in right shoulder: Secondary | ICD-10-CM

## 2016-06-11 DIAGNOSIS — Z5181 Encounter for therapeutic drug level monitoring: Secondary | ICD-10-CM

## 2016-06-11 DIAGNOSIS — F1120 Opioid dependence, uncomplicated: Secondary | ICD-10-CM

## 2016-06-11 DIAGNOSIS — M25551 Pain in right hip: Secondary | ICD-10-CM

## 2016-06-11 DIAGNOSIS — R5381 Other malaise: Secondary | ICD-10-CM | POA: Insufficient documentation

## 2016-06-11 DIAGNOSIS — G894 Chronic pain syndrome: Secondary | ICD-10-CM | POA: Diagnosis not present

## 2016-06-11 DIAGNOSIS — M47817 Spondylosis without myelopathy or radiculopathy, lumbosacral region: Secondary | ICD-10-CM | POA: Diagnosis not present

## 2016-06-11 DIAGNOSIS — Z79899 Other long term (current) drug therapy: Secondary | ICD-10-CM

## 2016-06-11 MED ORDER — MORPHINE SULFATE ER 100 MG PO TBCR: 100.0000 mg | EXTENDED_RELEASE_TABLET | Freq: Three times a day (TID) | ORAL | 0 refills | Status: DC

## 2016-06-11 MED ORDER — MORPHINE SULFATE 30 MG PO TABS: 30.0000 mg | ORAL_TABLET | Freq: Four times a day (QID) | ORAL | 0 refills | Status: DC | PRN

## 2016-06-11 NOTE — Progress Notes (Signed)
Subjective:    Patient ID: [name], male    DOB: 05/26/1961, 55 y.o.   MRN: [id number]  HPI: [name] is a 55 year old male who returns for follow up for chronic pain and medication refill. He states his pain is located in his right shoulder, lower back radiating into his bilateral lower extremitiesposteriorly and right hip pain. X-rays ordered, he verbalizes understanding. He rates his pain 8. His current exercise regime is walking short distances using walker. Mr. Obey was given Neuro Rehabilitation number to make an appointment for speech therapy per Dr. Letta Pate recommendations, he verbalizes understanding. Also states he has an appointment with Neurologist on Monday 06/15/2016.  Pain Inventory Average Pain 8 Pain Right Now 8 My pain is constant  In the last 24 hours, has pain interfered with the following? General activity 6 Relation with others 6 Enjoyment of life 6 What TIME of day is your pain at its worst? all Sleep (in general) Fair  Pain is worse with: walking, bending, sitting, inactivity and standing Pain improves with: medication Relief from Meds: 3  Mobility walk without assistance use a cane use a walker ability to climb steps?  yes needs help with transfers  Function employed # of hrs/week . I need assistance with the following:  dressing, meal prep, household duties and shopping  Neuro/Psych numbness dizziness anxiety  Prior Studies Any changes since last visit?  no  Physicians involved in your care Any changes since last visit?  no   Family History  Problem Relation Age of Onset  . Heart disease Father 37    Died of MI  . Cancer Mother    Social History   Social History  . Marital status: Married    Spouse name: N/A  . Number of children: N/A  . Years of education: N/A   Social History Main Topics  . Smoking status: Current Some Day Smoker    Packs/day: 1.50    Years: 29.00    Types: Cigarettes, E-cigarettes  .  Smokeless tobacco: Never Used     Comment: PT STOPPED SMOKING CIGS 03/27/13. NOW CURRENTLY USING E-CIG and smoking a few cigerettes per week.  . Alcohol use No  . Drug use:     Types: Marijuana     Comment: Urine showed THC  . Sexual activity: Not Asked   Other Topics Concern  . None   Social History Narrative  . None   Past Surgical History:  Procedure Laterality Date  . ASD REPAIR, SINUS VENOSUS    . CARDIAC DEFIBRILLATOR PLACEMENT  08/2010  . HERNIA REPAIR    . LEFT HEART CATHETERIZATION WITH CORONARY ANGIOGRAM N/A 06/14/2014   Procedure: LEFT HEART CATHETERIZATION WITH CORONARY ANGIOGRAM;  Surgeon: Jolaine Artist, MD;  Location: [facility] CATH LAB;  Service: Cardiovascular;  Laterality: N/A;  . TESTICLE SURGERY     testicular cancer surgery   Past Medical History:  Diagnosis Date  . Anxiety   . Automatic implantable cardiac defibrillator [employer]    Analyze ST  . Benign prostatic hypertrophy   . Benzodiazepine dependence (HCC)    chronic  . CAD (coronary artery disease)    Last cath 2/12. 3-v CAD. Failed PCI of distal RCAc  CATH [facility] 4/13 with DES to  LAD X2  . CHF (congestive heart failure) ([place])   . Chronic back pain    lumbar stenosis  . Chronic systolic heart failure (HCC)    EF 20-25%. s/p ST. Jude  ICD  . Depression   . DJD (degenerative joint disease)   . History of testicular cancer   . Narcotic dependence (HCC)    chronic   BP 103/70 (BP Location: Left Arm, Patient Position: Sitting, Cuff Size: Large)   Pulse 75   Resp 14   SpO2 95%   Opioid Risk Score:   Fall Risk Score:  `1  Depression screen PHQ 2/9  Depression screen [facility] 2/9 04/08/2016 02/13/2016 01/07/2016 12/26/2015 10/01/2015 06/16/2015 06/13/2015  Decreased Interest 0 1 0 0 1 0 0  Down, Depressed, Hopeless 0 0 0 0 0 0 0  PHQ - 2 Score 0 1 0 0 1 0 0  Altered sleeping - - - - - - -  Tired, decreased energy - - - - - - -  Change in appetite - - - - - - -  Feeling bad or failure about yourself  - - - -  - - -  Trouble concentrating - - - - - - -  Moving slowly or fidgety/restless - - - - - - -  Suicidal thoughts - - - - - - -  PHQ-9 Score - - - - - - -  Some recent data might be hidden   Review of Systems  Constitutional: Positive for diaphoresis.  HENT: Negative.   Eyes: Negative.   Respiratory: Positive for apnea, shortness of breath and wheezing.   Cardiovascular: Positive for leg swelling.  Gastrointestinal: Negative.   Endocrine: Negative.   Genitourinary: Negative.   Musculoskeletal: Positive for arthralgias, back pain and gait problem.  Skin: Negative.   Allergic/Immunologic: Negative.   Neurological: Positive for dizziness and numbness.  Hematological: Negative.   Psychiatric/Behavioral: The patient is nervous/anxious.   All other systems reviewed and are negative.      Objective:   Physical Exam  Constitutional: He is oriented to person, place, and time. He appears well-developed and well-nourished.  HENT:  Head: Normocephalic and atraumatic.  Neck: Normal range of motion. Neck supple.  Cardiovascular: Normal rate and regular rhythm.   Pulmonary/Chest: Effort normal and breath sounds normal.  Musculoskeletal:  Normal Muscle Bulk and Muscle Testing Reveals: Upper Extremities: Full ROM and Muscle Strength 5/5 Right AC Joint Tenderness Decreased ROM with Internal and External Rotation Lumbar Paraspinal Tenderness: L-3-L-5 Right Greater Trochanteric Tenderness Lower Extremities: Full ROM and Muscle Strength 5/5 Arises from Table Slowly using walker for support Narrow Based Gait   Neurological: He is alert and oriented to person, place, and time.  Skin: Skin is warm and dry.  Psychiatric: He has a normal mood and affect.  Nursing note and vitals reviewed.         Assessment & Plan:  1. Lumbar spinal stenosis, severe.  Refilled: MS Contin 100 mg one tablet three times a day #90 and MSIR 30 mg one tablet every 6 hours as needed for sever pain #120.  We  will continue the opioid monitoring program, this consists of regular clinic visits, examinations, urine drug screen, pill counts as well as use of [place] Controlled Substance Reporting System. 2. Narcotic dependence: Continue opioid monitoring program. 3. Right Shoulder Pain: RX: X-ray  4. Right Hip Pain: RX X-ray: Continue with Ice Therapy alternating with heat: 5. Sleep apnea : compliant with CPAP  6. Constipation: Continue Miralax  F/U in 1 month

## 2016-06-11 NOTE — Patient Instructions (Signed)
Neuro Rehabilitation: Speech Therapy: Call for an appointment  [phone]

## 2016-06-11 NOTE — Telephone Encounter (Signed)
PATIENT CANCEL APPT TODAY.PT CONSIDER A NO SHOW.HE IS A NEW PATIENT.

## 2016-06-12 ENCOUNTER — Ambulatory Visit: Payer: Self-pay | Admitting: Pulmonary Disease

## 2016-06-12 DIAGNOSIS — G4737 Central sleep apnea in conditions classified elsewhere: Secondary | ICD-10-CM | POA: Diagnosis not present

## 2016-06-12 DIAGNOSIS — G4733 Obstructive sleep apnea (adult) (pediatric): Secondary | ICD-10-CM | POA: Diagnosis not present

## 2016-06-15 ENCOUNTER — Ambulatory Visit: Payer: BLUE CROSS/BLUE SHIELD | Admitting: Neurology

## 2016-06-16 ENCOUNTER — Encounter: Payer: Self-pay | Admitting: Neurology

## 2016-06-23 ENCOUNTER — Telehealth: Payer: Self-pay | Admitting: *Deleted

## 2016-06-23 NOTE — Telephone Encounter (Signed)
Patient called requesting to be rescheduled for new patient visit. He stated he received a letter signed by Dr Xu/DJ stating that after three missed scheduled appointments he will be dismissed. Per Angie, manager, patient has missed two new patient appointments, therefore he cannot be seen. Informed the patient of Angie's message. He stated he should be seen per the letter he received. This RN advised him this note will be sent to Dr Erlinda Hong and his RN. Advised he will get a call back by the end of this week. He verbalized understanding.

## 2016-06-23 NOTE — Telephone Encounter (Signed)
Correction pts name was given to [facility] in referrals.She will schedule pt with any [facility] provider.

## 2016-06-23 NOTE — Telephone Encounter (Signed)
Per Dr. Erlinda Hong he can see anybody in the practice for his memory. Per Dr. Erlinda Hong he can r/s for a third time. Pt needs to know if he does not come in per [facility] guidelines and cancels or a no show he will be dismiss.

## 2016-06-23 NOTE — Telephone Encounter (Addendum)
Pt miss two new pt appts. Pt call the same day because he was sick. He r/s and was a no show at the other appt that he r/s.

## 2016-06-23 NOTE — Telephone Encounter (Deleted)
q 

## 2016-06-23 NOTE — Telephone Encounter (Signed)
[name] in referrals will schedule pt with any [facility] provider.

## 2016-06-23 NOTE — Telephone Encounter (Signed)
Per Angie, okay to reschedule patient's new pt appointment; he was not dismissed. Scheduled for 07/22/16 with Dr Jannifer Franklin; patient stated his wife will be bringing him. Advised he arrive 15-30 min early, bring current insurance information, med list. He stated he wrote down appointment information. He verbalized understanding, appreciation.

## 2016-07-01 ENCOUNTER — Ambulatory Visit (INDEPENDENT_AMBULATORY_CARE_PROVIDER_SITE_OTHER): Payer: BLUE CROSS/BLUE SHIELD | Admitting: Family Medicine

## 2016-07-01 ENCOUNTER — Encounter: Payer: Self-pay | Admitting: Family Medicine

## 2016-07-01 VITALS — BP 108/60 | HR 77 | Temp 97.5°F | Resp 17 | Ht 72.0 in | Wt 219.0 lb

## 2016-07-01 DIAGNOSIS — G8929 Other chronic pain: Secondary | ICD-10-CM

## 2016-07-01 DIAGNOSIS — F411 Generalized anxiety disorder: Secondary | ICD-10-CM

## 2016-07-01 DIAGNOSIS — R7611 Nonspecific reaction to tuberculin skin test without active tuberculosis: Secondary | ICD-10-CM

## 2016-07-01 DIAGNOSIS — E7800 Pure hypercholesterolemia, unspecified: Secondary | ICD-10-CM | POA: Diagnosis not present

## 2016-07-01 DIAGNOSIS — Z1159 Encounter for screening for other viral diseases: Secondary | ICD-10-CM | POA: Diagnosis not present

## 2016-07-01 DIAGNOSIS — M545 Low back pain, unspecified: Secondary | ICD-10-CM

## 2016-07-01 DIAGNOSIS — Z227 Latent tuberculosis: Secondary | ICD-10-CM

## 2016-07-01 DIAGNOSIS — I25118 Atherosclerotic heart disease of native coronary artery with other forms of angina pectoris: Secondary | ICD-10-CM | POA: Diagnosis not present

## 2016-07-01 DIAGNOSIS — D751 Secondary polycythemia: Secondary | ICD-10-CM

## 2016-07-01 DIAGNOSIS — Z8547 Personal history of malignant neoplasm of testis: Secondary | ICD-10-CM

## 2016-07-01 DIAGNOSIS — G4731 Primary central sleep apnea: Secondary | ICD-10-CM

## 2016-07-01 DIAGNOSIS — M48062 Spinal stenosis, lumbar region with neurogenic claudication: Secondary | ICD-10-CM

## 2016-07-01 DIAGNOSIS — K5903 Drug induced constipation: Secondary | ICD-10-CM

## 2016-07-01 DIAGNOSIS — Z23 Encounter for immunization: Secondary | ICD-10-CM

## 2016-07-01 DIAGNOSIS — Z95810 Presence of automatic (implantable) cardiac defibrillator: Secondary | ICD-10-CM

## 2016-07-01 DIAGNOSIS — E538 Deficiency of other specified B group vitamins: Secondary | ICD-10-CM

## 2016-07-01 DIAGNOSIS — T402X5A Adverse effect of other opioids, initial encounter: Secondary | ICD-10-CM

## 2016-07-01 DIAGNOSIS — Z114 Encounter for screening for human immunodeficiency virus [HIV]: Secondary | ICD-10-CM | POA: Diagnosis not present

## 2016-07-01 DIAGNOSIS — K5901 Slow transit constipation: Secondary | ICD-10-CM

## 2016-07-01 DIAGNOSIS — E291 Testicular hypofunction: Secondary | ICD-10-CM | POA: Diagnosis not present

## 2016-07-01 DIAGNOSIS — F1320 Sedative, hypnotic or anxiolytic dependence, uncomplicated: Secondary | ICD-10-CM

## 2016-07-01 DIAGNOSIS — R0902 Hypoxemia: Secondary | ICD-10-CM

## 2016-07-01 DIAGNOSIS — F1120 Opioid dependence, uncomplicated: Secondary | ICD-10-CM

## 2016-07-01 NOTE — Patient Instructions (Signed)
     IF you received an x-ray today, you will receive an invoice from [facility]. Please contact  Radiology at [phone] with questions or concerns regarding your invoice.   IF you received labwork today, you will receive an invoice from [facility]. Please contact [facility] at [phone] with questions or concerns regarding your invoice.   Our billing staff will not be able to assist you with questions regarding bills from these companies.  You will be contacted with the lab results as soon as they are available. The fastest way to get your results is to activate your My Chart account. Instructions are located on the last page of this paperwork. If you have not heard from us regarding the results in 2 weeks, please contact this office.     

## 2016-07-01 NOTE — Progress Notes (Signed)
Subjective:    Patient ID: KOBY EVOY, male    DOB: Jun 07, 1961, 55 y.o.   MRN: [id number]  07/01/2016  Medication Refill (Zofran); Establish Care; and splinter in foot (Right foot)   HPI This 56 y.o. male presents to establish care and for multiple concerns:   Oxygen dependent 5 liters for twelve hours per day.  [place] Controlled Substance Registry: 06/12/16 Diazepam 10mg  #60 [place] 12/1 Ambien 10mg  30  [place] 12/1 Lorazepam 1mg  #120 [place]ovsky 11/30 Morphine ER 100mg  #90  Marcello Moores 11/30 Moprhine IR 30mg  #120 Marcello Moores  Crohn's disease: diagnosed in [place]; hospitalized in [place] and diagnosed Crohn's; fistula development which is highly suggestive of Crohn's disease; then established with Erskine Emery in 2016. Cologuard negative in 2016.   Severe OSA: CPAP; Normajean Baxter was pulmonologist.  Previous Joya Gaskins was pulmonologist.   Polycythemia: followed at [facility] hematology.  Minimal labs at [facility] hematology.  Consultation note reviewed during visit; hematologist felt that elevated RBC count due to hypoxia, testosterone supplementation.  Recommended treated hypoxia and OSA and decrease testosterone supplementation prior to performing extensive testing; if hemoglobin remained elevated,then would proceed with further testing.  Per patient today "We did not adjust anything".  Tb aviane complex: s/p ID consultation.  Provided sputum at [facility]; sent to [facility]; diagnosed with Tb aviane complex.  Blood work negative.  S/p evaluation by Health Department.  No medication indicated due to cardiac issues.  CAD/cardiomyopathy: followed at [facility] cardiology every 4-6 months; followed by Dr. Caryl Comes every six months.  Not a heart transplant candidate.    History of testicular cancer/hypogonadism: [facility] every 1-2 months.  Chronic lower back pain: followed by [employer]. Has been maintained on same dose of morphine for ten years.  Chronic anxiety: followed by psychiatry; maintained on benzos for  years; wife is an alcoholic; has 50 year old son.  Wife won't leave.   Immunization History  Administered Date(s) Administered  . Influenza Split 04/12/2013, 09/11/2015  . Influenza Whole 05/25/2012  . Influenza,inj,Quad PF,36+ Mos 06/11/2014, 07/01/2016  . Pneumococcal Conjugate-13 06/11/2014  . Pneumococcal Polysaccharide-23 08/10/2013  . Td 08/10/2013    BP Readings from Last 3 Encounters:  07/08/16 (!) 154/107  07/01/16 108/60  06/11/16 103/70   Wt Readings from Last 3 Encounters:  07/01/16 219 lb (99.3 kg)  04/24/16 231 lb 9.6 oz (105.1 kg)  04/08/16 211 lb 6.4 oz (95.9 kg)    Review of Systems  Constitutional: Negative for activity change, appetite change, chills, diaphoresis, fatigue and fever.  Respiratory: Negative for cough and shortness of breath.   Cardiovascular: Negative for chest pain, palpitations and leg swelling.  Gastrointestinal: Negative for abdominal pain, diarrhea, nausea and vomiting.  Endocrine: Negative for cold intolerance, heat intolerance, polydipsia, polyphagia and polyuria.  Skin: Negative for color change, rash and wound.  Neurological: Negative for dizziness, tremors, seizures, syncope, facial asymmetry, speech difficulty, weakness, light-headedness, numbness and headaches.  Psychiatric/Behavioral: Negative for dysphoric mood and sleep disturbance. The patient is not nervous/anxious.     Past Medical History:  Diagnosis Date  . Anxiety   . Automatic implantable cardiac defibrillator [employer]    Analyze ST  . Benign prostatic hypertrophy   . Benzodiazepine dependence (HCC)    chronic  . CAD (coronary artery disease)    Last cath 2/12. 3-v CAD. Failed PCI of distal RCAc  CATH [facility] 4/13 with DES to  LAD X2  . CHF (congestive heart failure) ([place])   . Chronic back pain    lumbar stenosis  .  Chronic systolic heart failure (HCC)    EF 20-25%. s/p ST. Jude ICD  . Depression   . DJD (degenerative joint disease)   . History of testicular  cancer   . Narcotic dependence ([place])    chronic   Past Surgical History:  Procedure Laterality Date  . ASD REPAIR, SINUS VENOSUS    . CARDIAC DEFIBRILLATOR PLACEMENT  08/2010  . HERNIA REPAIR    . LEFT HEART CATHETERIZATION WITH CORONARY ANGIOGRAM N/A 06/14/2014   Procedure: LEFT HEART CATHETERIZATION WITH CORONARY ANGIOGRAM;  Surgeon: Jolaine Artist, MD;  Location: [facility] CATH LAB;  Service: Cardiovascular;  Laterality: N/A;  . TESTICLE SURGERY     testicular cancer surgery   Allergies  Allergen Reactions  . Darvocet [Propoxyphene N-Acetaminophen] Anaphylaxis    Throat closes  . Propoxyphene Anaphylaxis and Swelling  . Levaquin [Levofloxacin In D5w]     Joint aches   Current Outpatient Prescriptions  Medication Sig Dispense Refill  . alfuzosin (UROXATRAL) 10 MG 24 hr tablet Take 10 mg by mouth as needed. For UTI    . aspirin 325 MG tablet Take 325 mg by mouth daily.    . beclomethasone (QVAR) 40 MCG/ACT inhaler Inhale 2 puffs into the lungs 2 (two) times daily. 8.7 g 5  . carvedilol (COREG) 25 MG tablet Take 1 tablet (25 mg total) by mouth 2 (two) times daily. 180 tablet 2  . clopidogrel (PLAVIX) 75 MG tablet Take 75 mg by mouth daily.    . diazepam (VALIUM) 10 MG tablet Take 10 mg by mouth 2 (two) times daily.     Marland Kitchen ezetimibe (ZETIA) 10 MG tablet Take 10 mg by mouth daily.    Marland Kitchen lactulose (CHRONULAC) 10 GM/15ML solution Take 10 g by mouth daily as needed (for constipation). Reported on 10/07/2015    . lisinopril (PRINIVIL,ZESTRIL) 2.5 MG tablet Take 2.5 mg by mouth daily.    Marland Kitchen LORazepam (ATIVAN) 1 MG tablet Take 1 mg by mouth 4 (four) times daily as needed for anxiety or sleep. For anxiety    . nitroGLYCERIN (NITROSTAT) 0.4 MG SL tablet Place 0.4 mg under the tongue every 5 (five) minutes as needed for chest pain.    Marland Kitchen ondansetron (ZOFRAN-ODT) 8 MG disintegrating tablet Take 1 tablet (8 mg total) by mouth every 8 (eight) hours as needed for nausea. 30 tablet 0  . polyethylene glycol  powder (GLYCOLAX/MIRALAX) powder MIX 1 CAPFUL TWICE DAILY AS NEEDED. 250 g 0  . rosuvastatin (CRESTOR) 40 MG tablet Take 1 tablet (40 mg total) by mouth daily. 90 tablet 2  . sildenafil (VIAGRA) 100 MG tablet TAKE ONE TABLET BY MOUTH AS NEEDED FOR ERECTILE DYSFUNCTION. 4 tablet 6  . spironolactone (ALDACTONE) 25 MG tablet TAKE (1/2) TABLET DAILY. 45 tablet 2  . spironolactone (ALDACTONE) 25 MG tablet Take 0.5 tablet by mouth daily    . testosterone cypionate (DEPOTESTOTERONE CYPIONATE) 200 MG/ML injection Inject into the muscle every 14 (fourteen) days.      Marland Kitchen torsemide (DEMADEX) 20 MG tablet Take 1 tablet (20 mg total) by mouth daily as needed. For swelling in legs 90 tablet 2  . traZODone (DESYREL) 100 MG tablet Take 100 mg by mouth at bedtime as needed for sleep.     Marland Kitchen triamcinolone (NASACORT AQ) 55 MCG/ACT AERO nasal inhaler Place 1 spray into the nose daily. 1 Inhaler 12  . zolpidem (AMBIEN) 10 MG tablet Take 1 tablet by mouth at bedtime as needed for sleep.   4  .  amoxicillin (AMOXIL) 500 MG capsule Take 500 mg by mouth 2 (two) times daily.    Marland Kitchen amoxicillin-clavulanate (AUGMENTIN) 875-125 MG tablet Take 1 tablet by mouth 2 (two) times daily. (Patient not taking: Reported on 07/01/2016) 14 tablet 1  . morphine (MS CONTIN) 100 MG 12 hr tablet Take 1 tablet (100 mg total) by mouth 3 (three) times daily. 90 tablet 0  . morphine (MSIR) 30 MG tablet Take 1 tablet (30 mg total) by mouth every 6 (six) hours as needed for severe pain. 120 tablet 0   No current facility-administered medications for this visit.    Social History   Social History  . Marital status: Married    Spouse name: N/A  . Number of children: N/A  . Years of education: N/A   Occupational History  . Not on file.   Social History Main Topics  . Smoking status: Current Some Day Smoker    Packs/day: 1.50    Years: 29.00    Types: Cigarettes, E-cigarettes  . Smokeless tobacco: Never Used     Comment: PT STOPPED SMOKING  CIGS 03/27/13. NOW CURRENTLY USING E-CIG and smoking a few cigerettes per week.  . Alcohol use No  . Drug use:     Types: Marijuana     Comment: Urine showed THC  . Sexual activity: Not on file   Other Topics Concern  . Not on file   Social History Narrative   Marital status: married x 12 years; wife is severe alcoholic.      Children: 3 married daughters (23, 18, 60); 1 son (28yo); 3 grandchildren born in 2017      Lives: with wife, son      Employment:    Family History  Problem Relation Age of Onset  . Heart disease Father 109    Died of MI  . Cancer Mother        Objective:    BP 108/60 (BP Location: Right Arm, Patient Position: Sitting, Cuff Size: Normal)   Pulse 77   Temp 97.5 F (36.4 C) (Oral)   Resp 17   Ht 6' (1.829 m)   Wt 219 lb (99.3 kg)   SpO2 97%   BMI 29.70 kg/m  Physical Exam  Constitutional: He is oriented to person, place, and time. He appears well-developed and well-nourished. No distress.  HENT:  Head: Normocephalic and atraumatic.  Right Ear: External ear normal.  Left Ear: External ear normal.  Nose: Nose normal.  Mouth/Throat: Oropharynx is clear and moist.  Eyes: Conjunctivae and EOM are normal. Pupils are equal, round, and reactive to light.  Neck: Normal range of motion. Neck supple. Carotid bruit is not present. No thyromegaly present.  Cardiovascular: Normal rate, regular rhythm, normal heart sounds and intact distal pulses.  Exam reveals no gallop and no friction rub.   No murmur heard. Pulmonary/Chest: Effort normal and breath sounds normal. He has no wheezes. He has no rales.  Abdominal: Soft. Bowel sounds are normal. He exhibits no distension and no mass. There is no tenderness. There is no rebound and no guarding.  Lymphadenopathy:    He has no cervical adenopathy.  Neurological: He is alert and oriented to person, place, and time. No cranial nerve deficit.  Skin: Skin is warm and dry. No rash noted. He is not diaphoretic.    Psychiatric: He has a normal mood and affect. His behavior is normal.  Nursing note and vitals reviewed.  Results for orders placed or performed in visit on  07/01/16  CBC with Differential/Platelet  Result Value Ref Range   WBC 5.0 3.4 - 10.8 x10E3/uL   RBC 5.43 4.14 - 5.80 x10E6/uL   Hemoglobin 17.8 (H) 13.0 - 17.7 g/dL   Hematocrit 51.7 (H) 37.5 - 51.0 %   MCV 95 79 - 97 fL   MCH 32.8 26.6 - 33.0 pg   MCHC 34.4 31.5 - 35.7 g/dL   RDW 14.2 12.3 - 15.4 %   Platelets 149 (L) 150 - 379 x10E3/uL   Neutrophils 48 Not Estab. %   Lymphs 38 Not Estab. %   Monocytes 8 Not Estab. %   Eos 6 Not Estab. %   Basos 0 Not Estab. %   Neutrophils Absolute 2.4 1.4 - 7.0 x10E3/uL   Lymphocytes Absolute 1.9 0.7 - 3.1 x10E3/uL   Monocytes Absolute 0.4 0.1 - 0.9 x10E3/uL   EOS (ABSOLUTE) 0.3 0.0 - 0.4 x10E3/uL   Basophils Absolute 0.0 0.0 - 0.2 x10E3/uL   Immature Granulocytes 0 Not Estab. %   Immature Grans (Abs) 0.0 0.0 - 0.1 x10E3/uL  Comprehensive metabolic panel  Result Value Ref Range   Glucose 83 65 - 99 mg/dL   BUN 8 6 - 24 mg/dL   Creatinine, Ser 0.96 0.76 - 1.27 mg/dL   GFR calc non Af Amer 88 >59 mL/min/1.73   GFR calc Af Amer 102 >59 mL/min/1.73   BUN/Creatinine Ratio 8 (L) 9 - 20   Sodium 143 134 - 144 mmol/L   Potassium 4.1 3.5 - 5.2 mmol/L   Chloride 98 96 - 106 mmol/L   CO2 27 18 - 29 mmol/L   Calcium 9.4 8.7 - 10.2 mg/dL   Total Protein 6.9 6.0 - 8.5 g/dL   Albumin 4.0 3.5 - 5.5 g/dL   Globulin, Total 2.9 1.5 - 4.5 g/dL   Albumin/Globulin Ratio 1.4 1.2 - 2.2   Bilirubin Total 0.4 0.0 - 1.2 mg/dL   Alkaline Phosphatase 69 39 - 117 IU/L   AST 12 0 - 40 IU/L   ALT 11 0 - 44 IU/L  Lipid panel  Result Value Ref Range   Cholesterol, Total 209 (H) 100 - 199 mg/dL   Triglycerides 132 0 - 149 mg/dL   HDL 40 >39 mg/dL   VLDL Cholesterol Cal 26 5 - 40 mg/dL   LDL Calculated 143 (H) 0 - 99 mg/dL   Chol/HDL Ratio 5.2 (H) 0.0 - 5.0 ratio units  Testosterone  Result Value Ref  Range   Testosterone 878 264 - 916 ng/dL  Vitamin B12  Result Value Ref Range   Vitamin B-12 231 (L) 232 - 1,245 pg/mL  Methylmalonic Acid, Serum  Result Value Ref Range   Methylmalonic Acid 407 (H) 0 - 378 nmol/L  HIV antibody  Result Value Ref Range   HIV Screen 4th Generation wRfx Non Reactive Non Reactive  Hepatitis C antibody  Result Value Ref Range   Hep C Virus Ab <0.1 0.0 - 0.9 s/co ratio       Assessment & Plan:   1. Pure hypercholesterolemia   2. Vitamin B 12 deficiency   3. Polycythemia   4. Hypogonadism in male   5. Screening for HIV (human immunodeficiency virus)   6. Need for hepatitis C screening test   7. Atherosclerosis of native coronary artery of native heart with other form of angina pectoris ([place])   8. Complex sleep apnea syndrome   9. Therapeutic opioid induced constipation   10. Slow transit constipation   11. cHistory  of testicular cancer   12. Need for prophylactic vaccination and inoculation against influenza   13. Generalized anxiety disorder   14. Benzodiazepine dependence ([place])   15. Chronic bilateral low back pain without sciatica   16. Hypoxemia   17. 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 Narcotic dependence ([place])   19. 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 Dual implantable cardioverter-defibrillator in situ    -discussed medical history at length during visit.   -obtain labs; no recent labs since 09/2015 and those performed at [facility]. -s/p flu vaccine. -reporting episodes of near syncope every afternoon; recommend checking blood pressure with symptoms.  Also recommend follow-up with cardiology or to ED for syncopal event. Obtain labs.  S/p neurology consultation 01/2016 for memory issues and staring spells; not sure if current symptoms related or new and separate issue.  Vitamin B12 level low with neurology; will repeat today.   Orders Placed This Encounter  Procedures  . Flu Vaccine QUAD 36+ mos IM  . CBC with  Differential/Platelet  . Comprehensive metabolic panel    Order Specific Question:   Has the patient fasted?    Answer:   Yes  . Lipid panel    Order Specific Question:   Has the patient fasted?    Answer:   Yes  . Testosterone  . Vitamin B12  . Methylmalonic Acid, Serum  . HIV antibody  . Hepatitis C antibody   No orders of the defined types were placed in this encounter.   No Follow-up on file.   Ames Hoban Elayne Guerin, M.D. [facility] [address] [place], [place]  [id number] [phone] phone [phone] fax

## 2016-07-07 LAB — CBC WITH DIFFERENTIAL/PLATELET
Basophils Absolute: 0 10*3/uL (ref 0.0–0.2)
Basos: 0 %
EOS (ABSOLUTE): 0.3 10*3/uL (ref 0.0–0.4)
Eos: 6 %
Hematocrit: 51.7 % — ABNORMAL HIGH (ref 37.5–51.0)
Hemoglobin: 17.8 g/dL — ABNORMAL HIGH (ref 13.0–17.7)
Immature Grans (Abs): 0 10*3/uL (ref 0.0–0.1)
Immature Granulocytes: 0 %
Lymphocytes Absolute: 1.9 10*3/uL (ref 0.7–3.1)
Lymphs: 38 %
MCH: 32.8 pg (ref 26.6–33.0)
MCHC: 34.4 g/dL (ref 31.5–35.7)
MCV: 95 fL (ref 79–97)
Monocytes Absolute: 0.4 10*3/uL (ref 0.1–0.9)
Monocytes: 8 %
Neutrophils Absolute: 2.4 10*3/uL (ref 1.4–7.0)
Neutrophils: 48 %
Platelets: 149 10*3/uL — ABNORMAL LOW (ref 150–379)
RBC: 5.43 x10E6/uL (ref 4.14–5.80)
RDW: 14.2 % (ref 12.3–15.4)
WBC: 5 10*3/uL (ref 3.4–10.8)

## 2016-07-07 LAB — LIPID PANEL
Chol/HDL Ratio: 5.2 ratio units — ABNORMAL HIGH (ref 0.0–5.0)
Cholesterol, Total: 209 mg/dL — ABNORMAL HIGH (ref 100–199)
HDL: 40 mg/dL (ref >39)
LDL Calculated: 143 mg/dL — ABNORMAL HIGH (ref 0–99)
Triglycerides: 132 mg/dL (ref 0–149)
VLDL Cholesterol Cal: 26 mg/dL (ref 5–40)

## 2016-07-07 LAB — COMPREHENSIVE METABOLIC PANEL
ALT: 11 IU/L (ref 0–44)
AST: 12 IU/L (ref 0–40)
Albumin/Globulin Ratio: 1.4 (ref 1.2–2.2)
Albumin: 4 g/dL (ref 3.5–5.5)
Alkaline Phosphatase: 69 IU/L (ref 39–117)
BUN/Creatinine Ratio: 8 — ABNORMAL LOW (ref 9–20)
BUN: 8 mg/dL (ref 6–24)
Bilirubin Total: 0.4 mg/dL (ref 0.0–1.2)
CO2: 27 mmol/L (ref 18–29)
Calcium: 9.4 mg/dL (ref 8.7–10.2)
Chloride: 98 mmol/L (ref 96–106)
Creatinine, Ser: 0.96 mg/dL (ref 0.76–1.27)
GFR calc Af Amer: 102 mL/min/{1.73_m2} (ref >59)
GFR calc non Af Amer: 88 mL/min/{1.73_m2} (ref >59)
Globulin, Total: 2.9 g/dL (ref 1.5–4.5)
Glucose: 83 mg/dL (ref 65–99)
Potassium: 4.1 mmol/L (ref 3.5–5.2)
Sodium: 143 mmol/L (ref 134–144)
Total Protein: 6.9 g/dL (ref 6.0–8.5)

## 2016-07-07 LAB — TESTOSTERONE: Testosterone: 878 ng/dL (ref 264–916)

## 2016-07-07 LAB — VITAMIN B12: Vitamin B-12: 231 pg/mL — ABNORMAL LOW (ref 232–1245)

## 2016-07-07 LAB — METHYLMALONIC ACID, SERUM: Methylmalonic Acid: 407 nmol/L — ABNORMAL HIGH (ref 0–378)

## 2016-07-07 LAB — HEPATITIS C ANTIBODY: Hep C Virus Ab: <0.1 s/co ratio (ref 0.0–0.9)

## 2016-07-07 LAB — HIV ANTIBODY (ROUTINE TESTING W REFLEX): HIV Screen 4th Generation wRfx: NONREACTIVE

## 2016-07-08 ENCOUNTER — Encounter: Payer: BLUE CROSS/BLUE SHIELD | Attending: Physical Medicine & Rehabilitation | Admitting: *Deleted

## 2016-07-08 ENCOUNTER — Telehealth (HOSPITAL_COMMUNITY): Payer: Self-pay | Admitting: *Deleted

## 2016-07-08 ENCOUNTER — Encounter: Payer: Self-pay | Admitting: *Deleted

## 2016-07-08 VITALS — BP 154/107 | HR 91 | Resp 16

## 2016-07-08 DIAGNOSIS — M47817 Spondylosis without myelopathy or radiculopathy, lumbosacral region: Secondary | ICD-10-CM

## 2016-07-08 DIAGNOSIS — R5381 Other malaise: Secondary | ICD-10-CM | POA: Diagnosis not present

## 2016-07-08 DIAGNOSIS — Z5181 Encounter for therapeutic drug level monitoring: Secondary | ICD-10-CM

## 2016-07-08 DIAGNOSIS — Z79899 Other long term (current) drug therapy: Secondary | ICD-10-CM

## 2016-07-08 DIAGNOSIS — G894 Chronic pain syndrome: Secondary | ICD-10-CM

## 2016-07-08 MED ORDER — MORPHINE SULFATE 30 MG PO TABS: 30.0000 mg | ORAL_TABLET | Freq: Four times a day (QID) | ORAL | 0 refills | Status: DC | PRN

## 2016-07-08 MED ORDER — MORPHINE SULFATE ER 100 MG PO TBCR: 100.0000 mg | EXTENDED_RELEASE_TABLET | Freq: Three times a day (TID) | ORAL | 0 refills | Status: DC

## 2016-07-08 NOTE — Telephone Encounter (Signed)
Pt was at another MD office today and had the nurse call us about his BP.  She reported pt's BP was 154/108 in right arm and 151/98 in left arm.  Pt was asymptomatic with this and states he has been taking all meds.  She reported pt's BP is usually in the 100s range, she states pt told her he as been checking BP at home recently it has been around [id number] systolic.  She is concerned and wanted Dr Haroldine Laws to be aware.  Discussed w/Dr Bensimhon, he recommends pt continue to monitor BP and call us back if is consistently running high.  Attempted to call pt back with advise and left him a message to call back.

## 2016-07-08 NOTE — Progress Notes (Signed)
[name] is here for medication refill visit with RN.  Morphine Sulfate ER 100 mg #90 and MSIR 30 mg #120  last fill date 06/11/16.  [place] reviewed and appropriate. His pain level is a 7. He has not felt great in the past week or so.  His BP today is elevated. 154/108 in his right arm.  I checked left and it is 151/98.  His BP is not typically high and frequently low. He was in to see his PCP 07/01/16 and reports some of his blood levels were off and has been feeling like he could fall out at times. He is not symptomatic at this time.   I told him he would need to be seen by Urgent Care or his cardiologist/PCP for a pressure this high (last check 154/107) since it is not typical for him. He called Dr Bensimohn's office while sitting in exam room. I spoke with Nira Conn in Dr Bensimohn's [facility] and she will discuss with Dr Jeffie Pollock and call [name] back this afternoon.  His only complaint is being thirsty and has gotten up to get water several times this visit. I have given him refills of his MSIR 30 mg #120 and Morphine Sulfate ER 100 mg #90. He says that he has not taken his lisinopril today (usually takes around noon) and not had his diazepam or ativan since he had to spend time with his child. Encouraged him to take them when he gets home. He will return in one month to see NP.

## 2016-07-13 DIAGNOSIS — G4733 Obstructive sleep apnea (adult) (pediatric): Secondary | ICD-10-CM | POA: Diagnosis not present

## 2016-07-13 DIAGNOSIS — G4737 Central sleep apnea in conditions classified elsewhere: Secondary | ICD-10-CM | POA: Diagnosis not present

## 2016-07-20 DIAGNOSIS — F4322 Adjustment disorder with anxiety: Secondary | ICD-10-CM | POA: Diagnosis not present

## 2016-07-22 ENCOUNTER — Ambulatory Visit (INDEPENDENT_AMBULATORY_CARE_PROVIDER_SITE_OTHER): Payer: BLUE CROSS/BLUE SHIELD | Admitting: Neurology

## 2016-07-22 ENCOUNTER — Encounter: Payer: Self-pay | Admitting: Neurology

## 2016-07-22 VITALS — BP 123/83 | HR 70 | Ht 71.0 in | Wt 215.0 lb

## 2016-07-22 DIAGNOSIS — E538 Deficiency of other specified B group vitamins: Secondary | ICD-10-CM | POA: Diagnosis not present

## 2016-07-22 DIAGNOSIS — R404 Transient alteration of awareness: Secondary | ICD-10-CM | POA: Diagnosis not present

## 2016-07-22 DIAGNOSIS — R413 Other amnesia: Secondary | ICD-10-CM

## 2016-07-22 HISTORY — DX: Deficiency of other specified B group vitamins: E53.8

## 2016-07-22 MED ORDER — CYANOCOBALAMIN 1000 MCG/ML IJ SOLN
1000.0000 ug | Freq: Once | INTRAMUSCULAR | Status: AC
  Administered 2016-07-22: 1000 ug via INTRAMUSCULAR

## 2016-07-22 MED ORDER — CYANOCOBALAMIN 1000 MCG/ML IJ SOLN: 1000.0000 ug | INTRAMUSCULAR | 3 refills | Status: DC

## 2016-07-22 NOTE — Progress Notes (Signed)
Pt arrives for B 12 injection.Cyanocobalamin 1000mcg/1ml administered IM to L deltoid using aseptic technique. Pt tolerated well.Bandaid applied.  

## 2016-07-22 NOTE — Patient Instructions (Signed)
   We will check EEG, and get neuropsychological testing.
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
## 2016-07-23 ENCOUNTER — Other Ambulatory Visit (HOSPITAL_COMMUNITY): Payer: Self-pay | Admitting: Internal Medicine

## 2016-07-24 ENCOUNTER — Encounter: Payer: Self-pay | Admitting: Neurology

## 2016-07-24 ENCOUNTER — Telehealth: Payer: Self-pay | Admitting: Neurology

## 2016-07-24 NOTE — Telephone Encounter (Signed)
I called the patient, he picked up a 1 mL vile of vitamin B12, the pharmacy was sent a prescription for a 10 mL vial, the patient will need to go back to the pharmacy and clarify this.  He also wants a letter indicating that he cannot fly due to medical issues, claims that he is still having TIA events, has low back pain.

## 2016-07-27 ENCOUNTER — Encounter: Payer: Self-pay | Admitting: Psychology

## 2016-07-27 NOTE — Telephone Encounter (Signed)
Letter completed, signed and placed up front for pt pick up.

## 2016-08-03 ENCOUNTER — Encounter: Payer: BLUE CROSS/BLUE SHIELD | Attending: Physical Medicine & Rehabilitation | Admitting: Registered Nurse

## 2016-08-03 ENCOUNTER — Encounter: Payer: Self-pay | Admitting: Registered Nurse

## 2016-08-03 VITALS — BP 124/84 | HR 88 | Resp 12

## 2016-08-03 DIAGNOSIS — M25551 Pain in right hip: Secondary | ICD-10-CM | POA: Diagnosis not present

## 2016-08-03 DIAGNOSIS — R5381 Other malaise: Secondary | ICD-10-CM | POA: Insufficient documentation

## 2016-08-03 DIAGNOSIS — M47817 Spondylosis without myelopathy or radiculopathy, lumbosacral region: Secondary | ICD-10-CM | POA: Diagnosis not present

## 2016-08-03 DIAGNOSIS — Z5181 Encounter for therapeutic drug level monitoring: Secondary | ICD-10-CM

## 2016-08-03 DIAGNOSIS — F1120 Opioid dependence, uncomplicated: Secondary | ICD-10-CM

## 2016-08-03 DIAGNOSIS — Z79899 Other long term (current) drug therapy: Secondary | ICD-10-CM

## 2016-08-03 DIAGNOSIS — G894 Chronic pain syndrome: Secondary | ICD-10-CM | POA: Diagnosis not present

## 2016-08-03 MED ORDER — MORPHINE SULFATE 30 MG PO TABS: 30.0000 mg | ORAL_TABLET | Freq: Four times a day (QID) | ORAL | 0 refills | Status: DC | PRN

## 2016-08-03 MED ORDER — MORPHINE SULFATE ER 100 MG PO TBCR: 100.0000 mg | EXTENDED_RELEASE_TABLET | Freq: Three times a day (TID) | ORAL | 0 refills | Status: DC

## 2016-08-03 NOTE — Progress Notes (Signed)
Subjective:    Patient ID: [name], male    DOB: [birth year], 56 y.o.   MRN: [id number]  HPI:  [name] is a 56 year old male who returns for follow up appointment for chronic pain and medication refill. He states his pain is located in his ower back radiating into his bilateral lower extremities posteriorly. He rates his pain 7. His current exercise regime is walking short distances using walker.  Pain Inventory Average Pain 7 Pain Right Now 7 My pain is sharp, burning, dull, stabbing, tingling and aching  In the last 24 hours, has pain interfered with the following? General activity n/a Relation with others n/a Enjoyment of life n/a What TIME of day is your pain at its worst? Morning, evening Sleep (in general) Fair  Pain is worse with: n/a Pain improves with: n/a Relief from Meds: 5  Mobility use a walker how many minutes can you walk? 5 ability to climb steps?  yes do you drive?  no Do you have any goals in this area?  yes  Function retired I need assistance with the following:  dressing, meal prep, household duties and shopping  Neuro/Psych numbness tremor tingling trouble walking spasms dizziness anxiety  Prior Studies Any changes since last visit?  no  Physicians involved in your care Any changes since last visit?  no   Family History  Problem Relation Age of Onset  . Heart disease Father 20    Died of MI  . Cancer Mother    Social History   Social History  . Marital status: Married    Spouse name: N/A  . Number of children: 3  . Years of education: N/A   Social History Main Topics  . Smoking status: Current Some Day Smoker    Packs/day: 1.50    Years: 29.00    Types: Cigarettes, E-cigarettes  . Smokeless tobacco: Never Used     Comment: PT STOPPED SMOKING CIGS 03/27/13. NOW CURRENTLY USING E-CIG and smoking a few cigerettes per week.  . Alcohol use No  . Drug use: Yes    Types: Marijuana     Comment: Urine showed THC    . Sexual activity: Not Asked   Other Topics Concern  . None   Social History Narrative   Marital status: married x 12 years; wife is severe alcoholic.      Children: 3 married daughters (23, 56, 37); 1 son (26yo); 3 grandchildren born in 2017      Lives: with wife, son      Left-handed   Caffeine: 3 drinks per day   Past Surgical History:  Procedure Laterality Date  . ASD REPAIR, SINUS VENOSUS    . CARDIAC DEFIBRILLATOR PLACEMENT  08/2010  . HERNIA REPAIR    . LEFT HEART CATHETERIZATION WITH CORONARY ANGIOGRAM N/A 06/14/2014   Procedure: LEFT HEART CATHETERIZATION WITH CORONARY ANGIOGRAM;  Surgeon: Jolaine Artist, MD;  Location: [facility] CATH LAB;  Service: Cardiovascular;  Laterality: N/A;  . TESTICLE SURGERY     testicular cancer surgery   Past Medical History:  Diagnosis Date  . Anxiety   . Automatic implantable cardiac defibrillator [employer] [facility]    Analyze ST  . Benign prostatic hypertrophy   . Benzodiazepine dependence (HCC)    chronic  . CAD (coronary artery disease)    Last cath 2/12. 3-v CAD. Failed PCI of distal RCAc  CATH [facility] 4/13 with DES to  LAD X2  . CHF (congestive heart failure) (  [place])   . Chronic back pain    lumbar stenosis  . Chronic systolic heart failure (HCC)    EF 20-25%. s/p ST. Jude ICD  . Depression   . DJD (degenerative joint disease)   . History of testicular cancer   . Narcotic dependence (HCC)    chronic  . Vitamin B12 deficiency 07/22/2016   BP 124/84   Pulse 88   Resp 12   SpO2 (!) 88%   Opioid Risk Score:   Fall Risk Score:  `1  Depression screen PHQ 2/9  Depression screen [facility] 2/9 07/01/2016 04/08/2016 02/13/2016 01/07/2016 12/26/2015 10/01/2015 06/16/2015  Decreased Interest 0 0 1 0 0 1 0  Down, Depressed, Hopeless 1 0 0 0 0 0 0  PHQ - 2 Score 1 0 1 0 0 1 0  Altered sleeping - - - - - - -  Tired, decreased energy - - - - - - -  Change in appetite - - - - - - -  Feeling bad or failure about yourself  - - - - - - -  Trouble concentrating  - - - - - - -  Moving slowly or fidgety/restless - - - - - - -  Suicidal thoughts - - - - - - -  PHQ-9 Score - - - - - - -  Some recent data might be hidden     Review of Systems  Constitutional: Negative.   HENT: Negative.   Eyes: Negative.   Respiratory: Negative.   Cardiovascular: Negative.   Gastrointestinal: Negative.   Endocrine: Negative.   Genitourinary: Negative.   Musculoskeletal: Negative.   Skin: Negative.   Allergic/Immunologic: Negative.   Neurological: Negative.   Hematological: Negative.   Psychiatric/Behavioral: Negative.   All other systems reviewed and are negative.      Objective:   Physical Exam  Constitutional: He is oriented to person, place, and time. He appears well-developed and well-nourished.  HENT:  Head: Normocephalic and atraumatic.  Neck: Normal range of motion. Neck supple.  Cardiovascular: Normal rate and regular rhythm.   Pulmonary/Chest: Effort normal and breath sounds normal.  Musculoskeletal:  Normal Muscle Bulk and Muscle Testing Reveals: Upper Extremities: Full ROM and Muscle Strength 5/5 Lumbar Paraspinal Tenderness: L-3-L-5 Lower Extremities: Full ROM and Muscle Strength 5/5 Arises from Table Slowly using walker for support Narrow based gait  Neurological: He is alert and oriented to person, place, and time.  Skin: Skin is warm and dry.  Psychiatric: He has a normal mood and affect.  Nursing note and vitals reviewed.         Assessment & Plan:  1. Lumbar spinal stenosis, severe.  Refilled: MS Contin 100 mg one tablet three times a day #90 and MSIR 30 mg one tablet every 6 hours as needed for sever pain #120.  We will continue the opioid monitoring program, this consists of regular clinic visits, examinations, urine drug screen, pill counts as well as use of [place] Controlled Substance Reporting System. 2. Narcotic dependence: Continue opioid monitoring program. 3. Sleep apnea : compliant with CPAP  4.  Constipation: Continue Miralax  F/U in 1 month

## 2016-08-06 ENCOUNTER — Encounter: Payer: Self-pay | Admitting: Psychology

## 2016-08-10 LAB — TOXASSURE SELECT,+ANTIDEPR,UR

## 2016-08-10 LAB — 6-ACETYLMORPHINE,TOXASSURE ADD
6-ACETYLMORPHINE: NEGATIVE
6-acetylmorphine: NOT DETECTED ng/mg creat

## 2016-08-13 DIAGNOSIS — G4733 Obstructive sleep apnea (adult) (pediatric): Secondary | ICD-10-CM | POA: Diagnosis not present

## 2016-08-13 DIAGNOSIS — G4737 Central sleep apnea in conditions classified elsewhere: Secondary | ICD-10-CM | POA: Diagnosis not present

## 2016-08-13 NOTE — Progress Notes (Signed)
Urine drug screen for this encounter is consistent for prescribed medication 

## 2016-08-14 ENCOUNTER — Telehealth: Payer: Self-pay | Admitting: Neurology

## 2016-08-14 ENCOUNTER — Ambulatory Visit (INDEPENDENT_AMBULATORY_CARE_PROVIDER_SITE_OTHER): Payer: BLUE CROSS/BLUE SHIELD | Admitting: Neurology

## 2016-08-14 DIAGNOSIS — R404 Transient alteration of awareness: Secondary | ICD-10-CM

## 2016-08-14 DIAGNOSIS — R410 Disorientation, unspecified: Secondary | ICD-10-CM | POA: Diagnosis not present

## 2016-08-14 NOTE — Telephone Encounter (Signed)
Patient here for an EEG has questions about B12 dosage. They only gave him 1 mL vial. Please call or see him before he leaves. Best call back. [phone] or [phone]

## 2016-08-14 NOTE — Telephone Encounter (Signed)
Pt says the correct phone for dr Jannifer Franklin to call is [phone]. For B12 follow up.

## 2016-08-14 NOTE — Telephone Encounter (Signed)
   I called the patient. The EEG is normal. No evidence of seizures.

## 2016-08-14 NOTE — Procedures (Signed)
    History:  [name] is a 56 year old gentleman with a history of a mild memory disturbance and a history of episodes of altered awareness. The patient may have episodes of staring off. The patient is being evaluated for possible seizures.  This is a routine EEG. No skull defects are noted. Medications include aspirin, Coreg, Plavix, Valium, Zetia, lisinopril, morphine, nitroglycerin, MiraLAX, Crestor, Aldactone, testosterone, trazodone, triamcinolone, Ambien, and Uroxatral.   EEG classification: Normal awake  Description of the recording: The background rhythms of this recording consists of a fairly well modulated medium amplitude alpha rhythm of 10 Hz that is reactive to eye opening and closure. As the record progresses, the patient appears to remain in the waking state throughout the recording. Photic stimulation was performed, resulting in a bilateral and symmetric photic driving response. Hyperventilation was also performed, resulting in a minimal buildup of the background rhythm activities without significant slowing seen. At no time during the recording does there appear to be evidence of spike or spike wave discharges or evidence of focal slowing. EKG monitor shows no evidence of cardiac rhythm abnormalities with a heart rate of 78.  Impression: This is a normal EEG recording in the waking state. No evidence of ictal or interictal discharges are seen.

## 2016-08-14 NOTE — Telephone Encounter (Signed)
Called and spoke to both the patient and the pharmacy. Apparently, the 1 ml vial cost is around $6 while he 10 ml vial cost is around $160. Pt does have plenty of refills on file and will have to contact pharmacy to request (every 30 days) when needed.

## 2016-08-14 NOTE — Telephone Encounter (Signed)
Phone number noted.

## 2016-08-17 DIAGNOSIS — G4733 Obstructive sleep apnea (adult) (pediatric): Secondary | ICD-10-CM | POA: Diagnosis not present

## 2016-09-04 ENCOUNTER — Ambulatory Visit: Payer: Self-pay | Admitting: Physical Medicine & Rehabilitation

## 2016-09-04 ENCOUNTER — Ambulatory Visit: Payer: Self-pay | Admitting: Registered Nurse

## 2016-09-07 ENCOUNTER — Other Ambulatory Visit (HOSPITAL_COMMUNITY): Payer: Self-pay | Admitting: Internal Medicine

## 2016-09-07 ENCOUNTER — Encounter: Payer: Self-pay | Admitting: Psychology

## 2016-09-08 ENCOUNTER — Encounter: Payer: BLUE CROSS/BLUE SHIELD | Attending: Physical Medicine & Rehabilitation

## 2016-09-08 ENCOUNTER — Encounter: Payer: Self-pay | Admitting: Physical Medicine & Rehabilitation

## 2016-09-08 ENCOUNTER — Ambulatory Visit (HOSPITAL_BASED_OUTPATIENT_CLINIC_OR_DEPARTMENT_OTHER): Payer: BLUE CROSS/BLUE SHIELD | Admitting: Physical Medicine & Rehabilitation

## 2016-09-08 VITALS — BP 101/54 | HR 62

## 2016-09-08 DIAGNOSIS — R5381 Other malaise: Secondary | ICD-10-CM | POA: Diagnosis not present

## 2016-09-08 DIAGNOSIS — M48062 Spinal stenosis, lumbar region with neurogenic claudication: Secondary | ICD-10-CM | POA: Diagnosis not present

## 2016-09-08 DIAGNOSIS — F1120 Opioid dependence, uncomplicated: Secondary | ICD-10-CM

## 2016-09-08 DIAGNOSIS — K5903 Drug induced constipation: Secondary | ICD-10-CM | POA: Diagnosis not present

## 2016-09-08 DIAGNOSIS — T402X5A Adverse effect of other opioids, initial encounter: Secondary | ICD-10-CM | POA: Diagnosis not present

## 2016-09-08 MED ORDER — MORPHINE SULFATE 30 MG PO TABS: 30.0000 mg | ORAL_TABLET | Freq: Four times a day (QID) | ORAL | 0 refills | Status: DC | PRN

## 2016-09-08 MED ORDER — MORPHINE SULFATE ER 100 MG PO TBCR: 100.0000 mg | EXTENDED_RELEASE_TABLET | Freq: Three times a day (TID) | ORAL | 0 refills | Status: DC

## 2016-09-08 NOTE — Progress Notes (Signed)
Subjective:    Patient ID: [name], male    DOB: 1960-09-21, 56 y.o.   MRN: [id number]  HPI 56 year old Male with severe lumbar spinal stenosis, poor surgical candidate secondary to cardiomyopathy, coronary artery disease.    He is complaining of some increasing numbness of his feet. This is not causing much pain. He does note some weakness in his toes.  We discussed his bowels, chronic high-dose opiates. Takes lactulose and after a couple days. He usually has a BM. In the past. He tried peripherally acting mu opiate antagonist him a but complained that he had fecal incontinence Pain Inventory Average Pain 7 Pain Right Now 7 My pain is sharp, burning, stabbing and tingling  In the last 24 hours, has pain interfered with the following? General activity 0 Relation with others 0 Enjoyment of life 0 What TIME of day is your pain at its worst? . Sleep (in general) Poor  Pain is worse with: . Pain improves with: . Relief from Meds: .  Mobility use a walker ability to climb steps?  yes do you drive?  no  Function employed # of hrs/week 20  Neuro/Psych numbness trouble walking spasms anxiety  Prior Studies Any changes since last visit?  no Clinical Data:  Symptoms of spinal claudication.  Bilateral leg and foot numbness and weakness.   MYELOGRAM INJECTION  Technique:  Informed consent was obtained from the patient prior to the procedure, including potential complications of headache, allergy, infection and pain.  A timeout procedure was performed. With the patient prone, the lower back was prepped with Betadine. 1% Lidocaine was used for local anesthesia.  Lumbar puncture was performed at the left L2-3 level using a 22 gauge needle with return of clear CSF.  14 ml of Omnipaque 180was injected into the subarachnoid space .  IMPRESSION: Successful injection of  intrathecal contrast for myelography.  MYELOGRAM LUMBAR  Technique:  Following injection of  intrathecal Omnipaque contrast, spine imaging in multiple projections was performed using fluoroscopy. Because of the severe stenosis, the patient was placed in multiple different positions with side-to-side bending and flexion/extension maneuvers in an attempt to get more contrast below the region of stenosis.  That was not very successful.  Fluoroscopy Time: 0 minutes 54 seconds  Comparison:  CT 03/27/2011  Findings: L1-2:  Normal.  L2-3:  Mild narrowing of the canal without apparent compressive stenosis.  L3-4:  Severe multifactorial spinal stenosis with near complete block.  Anterolisthesis of 2-3 mm with flexion.  L4-5:  There is severe multifactorial stenosis with complete block. Anterolisthesis of 6 mm that increases to 9 mm with flexion and reduces to 3 mm with extension.  L5-S1:  Limited contrast reaches this area.  There is disc degeneration with loss of disc height.  There is anterolisthesis of 2 mm with flexion that reduces with extension.  IMPRESSION:  L2-3:  Mild multifactorial stenosis without apparent neural compression.  L3-4:  Severe multifactorial stenosis.  2 mm of anterolisthesis with flexion.  L4-5:  Very severe multifactorial stenosis.  6 mm of anterolisthesis that increases to 9 mm with flexion and reduces to 3 mm with extension.  L5-S1:  Poor contrast opacity because of the stenosis at L4-5. Disc degeneration and facet degeneration.  2 mm of anterolisthesis with flexion that reduces with extension.  CT MYELOGRAPHY LUMBAR SPINE  Technique:  CT imaging of the lumbar spine was performed after intrathecal contrast administration.  Multiplanar CT image reconstructions were also generated.  Findings:  There  is marrow mild curvature convex to the left. There is no stenosis or neural compression at L1-2 or T12-L1. Conus tip is at upper L1.  L2-3:  Mild bulging of the disc.  Mild facet and ligamentous hypertrophy.  Mild narrowing  of the canal without apparent neural compression.  L3-4:  Normal alignment in the supine position.  Circumferential bulging of the disc.  Bilateral facet and ligamentous hypertrophy. Moderate multifactorial spinal stenosis which would worsened with anterolisthesis.  L4-5:  Advanced disc degeneration with complete loss of disc height and vacuum phenomenon.  Anterolisthesis of 5 mm in this position. Facet and ligamentous hypertrophy.  Severe multifactorial spinal stenosis without any contrast penetrating into this region. Foraminal stenosis right worse than left.  L5 S1:  Disc degeneration with vacuum phenomenon.  Endplate osteophytes and shallow protrusion of disc material.  Facet and ligamentous hypertrophy.  Stenosis of the subarticular lateral recesses and neural foramina left worse than right.  Sacroiliac joints show mild osteoarthritis.  IMPRESSION: L2-3:  Mild stenosis due to bulging of the disc and mild facet and ligamentous hypertrophy.  No definite neural compression.  L3-4:  Moderate stenosis due to circumferential protrusion of disc material in combination with facet and ligamentous hypertrophy. Alignment is normal in the supine position.  When subluxation occurs, the stenosis would worsen.  L4-5:  Very severe multifactorial stenosis.  Advanced facet arthropathy with anterolisthesis of 5 mm.  Advanced disc degeneration.  Obliteration of the subarachnoid space at this level.  Foraminal stenosis as well, right worse than left.  L5-S1:  Facet and ligamentous hypertrophy.  Chronic disc degeneration.  Narrowing of the subarticular lateral recesses and neural foramina, left more than right.   Original Report Authenticated By: Nelson Chimes, M.D. Physicians involved in your care Any changes since last visit?  no   Family History  Problem Relation Age of Onset  . Heart disease Father 53    Died of MI  . Cancer Mother    Social History   Social History    . Marital status: Married    Spouse name: N/A  . Number of children: 3  . Years of education: N/A   Social History Main Topics  . Smoking status: Current Some Day Smoker    Packs/day: 1.50    Years: 29.00    Types: Cigarettes, E-cigarettes  . Smokeless tobacco: Never Used     Comment: PT STOPPED SMOKING CIGS 03/27/13. NOW CURRENTLY USING E-CIG and smoking a few cigerettes per week.  . Alcohol use No  . Drug use: Yes    Types: Marijuana     Comment: Urine showed THC  . Sexual activity: Not Asked   Other Topics Concern  . None   Social History Narrative   Marital status: married x 12 years; wife is severe alcoholic.      Children: 3 married daughters (23, 29, 5); 1 son (34yo); 3 grandchildren born in 2017      Lives: with wife, son      Left-handed   Caffeine: 3 drinks per day   Past Surgical History:  Procedure Laterality Date  . ASD REPAIR, SINUS VENOSUS    . CARDIAC DEFIBRILLATOR PLACEMENT  08/2010  . HERNIA REPAIR    . LEFT HEART CATHETERIZATION WITH CORONARY ANGIOGRAM N/A 06/14/2014   Procedure: LEFT HEART CATHETERIZATION WITH CORONARY ANGIOGRAM;  Surgeon: Jolaine Artist, MD;  Location: [facility] CATH LAB;  Service: Cardiovascular;  Laterality: N/A;  . TESTICLE SURGERY     testicular cancer surgery  Past Medical History:  Diagnosis Date  . Anxiety   . Automatic implantable cardiac defibrillator [employer]    Analyze ST  . Benign prostatic hypertrophy   . Benzodiazepine dependence (HCC)    chronic  . CAD (coronary artery disease)    Last cath 2/12. 3-v CAD. Failed PCI of distal RCAc  CATH [facility] 4/13 with DES to  LAD X2  . CHF (congestive heart failure) ([place])   . Chronic back pain    lumbar stenosis  . Chronic systolic heart failure (HCC)    EF 20-25%. s/p ST. Jude ICD  . Depression   . DJD (degenerative joint disease)   . History of testicular cancer   . Narcotic dependence (HCC)    chronic  . Vitamin B12 deficiency 07/22/2016   Pulse 62   SpO2 91%   Opioid  Risk Score:   Fall Risk Score:  `1  Depression screen PHQ 2/9  Depression screen [facility] 2/9 07/01/2016 04/08/2016 02/13/2016 01/07/2016 12/26/2015 10/01/2015 06/16/2015  Decreased Interest 0 0 1 0 0 1 0  Down, Depressed, Hopeless 1 0 0 0 0 0 0  PHQ - 2 Score 1 0 1 0 0 1 0  Altered sleeping - - - - - - -  Tired, decreased energy - - - - - - -  Change in appetite - - - - - - -  Feeling bad or failure about yourself  - - - - - - -  Trouble concentrating - - - - - - -  Moving slowly or fidgety/restless - - - - - - -  Suicidal thoughts - - - - - - -  PHQ-9 Score - - - - - - -  Some recent data might be hidden    Review of Systems  Constitutional: Negative.   HENT: Negative.   Eyes: Negative.   Respiratory: Negative.   Cardiovascular: Negative.   Gastrointestinal: Negative.   Endocrine: Negative.   Genitourinary: Negative.   Musculoskeletal: Negative.   Skin: Negative.   Allergic/Immunologic: Negative.   Neurological: Negative.   Hematological: Negative.   Psychiatric/Behavioral: Negative.   All other systems reviewed and are negative.      Objective:   Physical Exam  Constitutional: He is oriented to person, place, and time. He appears well-developed and well-nourished.  HENT:  Head: Normocephalic and atraumatic.  Eyes: Conjunctivae and EOM are normal. Pupils are equal, round, and reactive to light.  Neck: Normal range of motion.  Neurological: He is alert and oriented to person, place, and time. A sensory deficit is present. He exhibits normal muscle tone. Gait abnormal.  Reflex Scores:      Patellar reflexes are 1+ on the right side and 1+ on the left side.      Achilles reflexes are 0 on the right side and 0 on the left side. Sensation decreased to pinprick in both feet up to the supramalleolar area. There is some relative sparing at the fifth toe bilaterally. Straight leg raising Her strength is 5/5 bilateral hip flexor, knee extensor 4 at the great toe extensor and the toe  extensors 5 at the ankle dorsiflexor.  Gait is without evidence of toe drag or knee instability. Relates stenotic. Gait forward flexed using walker.  Psychiatric: He has a normal mood and affect. His behavior is normal.  Nursing note and vitals reviewed.  His back has no tenderness to palpation. Barbara range of motion is limited with flexion, extension, lateral bending and rotation.  Assessment & Plan:  1. Lumbar spinal stenosis with lower extremity numbness and weakness. He states that he is also been a "borderline diabetic for many years. The last hemoglobin A1c was 5.9. In 2016 B12 levels have been low as well. Now receiving supplementation  As discussed with patient. Difficult to say etiology of this lower extremity numbness, although I suspect this is more radicular than peripheral. An EMG/NCV should be able to clarify this Will perform next month.  Continue opioid monitoring program. This consists of regular clinic visits, examinations, urine drug screen, pill counts as well as use of [place] controlled substance reporting System.  Consider MorphoBond 100 mg in place of MS Contin 100 mg 3 times a day Continue MSIR 30 mg every 6 hours  May be good candidate for Narcan kit. will see if patient wants to pursue this will likely not covered by insurance  Over half of the 25 min visit was spent counseling and coordinating care.

## 2016-09-10 DIAGNOSIS — G4737 Central sleep apnea in conditions classified elsewhere: Secondary | ICD-10-CM | POA: Diagnosis not present

## 2016-09-10 DIAGNOSIS — G4733 Obstructive sleep apnea (adult) (pediatric): Secondary | ICD-10-CM | POA: Diagnosis not present

## 2016-09-18 ENCOUNTER — Telehealth: Payer: Self-pay | Admitting: Pulmonary Disease

## 2016-09-18 MED ORDER — AMOXICILLIN-POT CLAVULANATE 875-125 MG PO TABS: 1.0000 | ORAL_TABLET | Freq: Two times a day (BID) | ORAL | 1 refills | Status: DC

## 2016-09-18 NOTE — Telephone Encounter (Signed)
Spoke with the pt   He is c/o increased cough with dark yellow sputum and increased nasal congestion for the past 2 days  He is requesting abx  Has not had any f/c/s Too late in the day to make appt  Please advise thanks

## 2016-09-18 NOTE — Telephone Encounter (Signed)
Spoke with pt and informed him of VS message. Pt agreed to the medication being called in. The rx was sent. Nothing further is needed at this time.

## 2016-09-18 NOTE — Telephone Encounter (Signed)
Can send script for augmentin 875 bid, #14 with one refill.  He should take for 1 week >> if not full improved, then take for a second week.

## 2016-09-28 DIAGNOSIS — F4322 Adjustment disorder with anxiety: Secondary | ICD-10-CM | POA: Diagnosis not present

## 2016-10-06 ENCOUNTER — Ambulatory Visit: Payer: Self-pay | Admitting: Physical Medicine & Rehabilitation

## 2016-10-08 ENCOUNTER — Ambulatory Visit (HOSPITAL_BASED_OUTPATIENT_CLINIC_OR_DEPARTMENT_OTHER): Payer: BLUE CROSS/BLUE SHIELD | Admitting: Physical Medicine & Rehabilitation

## 2016-10-08 ENCOUNTER — Encounter: Payer: Self-pay | Admitting: Physical Medicine & Rehabilitation

## 2016-10-08 ENCOUNTER — Encounter: Payer: BLUE CROSS/BLUE SHIELD | Attending: Physical Medicine & Rehabilitation

## 2016-10-08 VITALS — BP 135/89 | HR 84

## 2016-10-08 DIAGNOSIS — M48062 Spinal stenosis, lumbar region with neurogenic claudication: Secondary | ICD-10-CM | POA: Diagnosis not present

## 2016-10-08 DIAGNOSIS — R5381 Other malaise: Secondary | ICD-10-CM | POA: Insufficient documentation

## 2016-10-08 MED ORDER — MORPHINE SULFATE 30 MG PO TABS: 30.0000 mg | ORAL_TABLET | Freq: Four times a day (QID) | ORAL | 0 refills | Status: DC | PRN

## 2016-10-08 MED ORDER — MORPHINE SULFATE ER 100 MG PO TBCR: 100.0000 mg | EXTENDED_RELEASE_TABLET | Freq: Three times a day (TID) | ORAL | 0 refills | Status: DC

## 2016-10-08 NOTE — Patient Instructions (Signed)
I think the majority of your findings are related to your lumbar stenosis with radiculopathy. He did have some evidence of neuropathy as well, but it cannot be determined whether it's related to the prediabetes versus the B12.

## 2016-10-08 NOTE — Progress Notes (Signed)
EMG/NCV of the lower limbs performed today to assess increasing numbness and weakness of the lower limbs. Please see scanned report under media section. Summary Abnormal study Evidence of multilevel lumbar radiculopathy affecting L4-L5 dermatomes and myotomes primarily There is also evidence of peripheral neuropathy with absent superficial peroneal on the right and reduced amplitude. Right sural. This may be related to either his history of borderline diabetes, or B12 deficiency

## 2016-10-11 DIAGNOSIS — G4737 Central sleep apnea in conditions classified elsewhere: Secondary | ICD-10-CM | POA: Diagnosis not present

## 2016-10-11 DIAGNOSIS — G4733 Obstructive sleep apnea (adult) (pediatric): Secondary | ICD-10-CM | POA: Diagnosis not present

## 2016-10-12 ENCOUNTER — Encounter: Payer: Self-pay | Admitting: Cardiology

## 2016-10-22 ENCOUNTER — Encounter: Payer: Self-pay | Admitting: Physical Medicine & Rehabilitation

## 2016-11-02 ENCOUNTER — Ambulatory Visit: Payer: Self-pay | Admitting: Physical Medicine & Rehabilitation

## 2016-11-05 ENCOUNTER — Ambulatory Visit: Payer: Self-pay | Admitting: Physical Medicine & Rehabilitation

## 2016-11-09 DIAGNOSIS — I25118 Atherosclerotic heart disease of native coronary artery with other forms of angina pectoris: Secondary | ICD-10-CM | POA: Diagnosis not present

## 2016-11-09 DIAGNOSIS — I5022 Chronic systolic (congestive) heart failure: Secondary | ICD-10-CM | POA: Diagnosis not present

## 2016-11-10 ENCOUNTER — Encounter: Payer: Self-pay | Admitting: Physical Medicine & Rehabilitation

## 2016-11-10 ENCOUNTER — Ambulatory Visit (HOSPITAL_BASED_OUTPATIENT_CLINIC_OR_DEPARTMENT_OTHER): Payer: BLUE CROSS/BLUE SHIELD | Admitting: Physical Medicine & Rehabilitation

## 2016-11-10 ENCOUNTER — Encounter: Payer: BLUE CROSS/BLUE SHIELD | Attending: Physical Medicine & Rehabilitation

## 2016-11-10 DIAGNOSIS — Z5181 Encounter for therapeutic drug level monitoring: Secondary | ICD-10-CM | POA: Diagnosis not present

## 2016-11-10 DIAGNOSIS — E291 Testicular hypofunction: Secondary | ICD-10-CM

## 2016-11-10 DIAGNOSIS — D45 Polycythemia vera: Secondary | ICD-10-CM | POA: Diagnosis not present

## 2016-11-10 DIAGNOSIS — Z79899 Other long term (current) drug therapy: Secondary | ICD-10-CM

## 2016-11-10 DIAGNOSIS — M48062 Spinal stenosis, lumbar region with neurogenic claudication: Secondary | ICD-10-CM

## 2016-11-10 DIAGNOSIS — G4733 Obstructive sleep apnea (adult) (pediatric): Secondary | ICD-10-CM | POA: Diagnosis not present

## 2016-11-10 DIAGNOSIS — R5381 Other malaise: Secondary | ICD-10-CM | POA: Insufficient documentation

## 2016-11-10 DIAGNOSIS — G894 Chronic pain syndrome: Secondary | ICD-10-CM

## 2016-11-10 DIAGNOSIS — G4737 Central sleep apnea in conditions classified elsewhere: Secondary | ICD-10-CM | POA: Diagnosis not present

## 2016-11-10 DIAGNOSIS — E538 Deficiency of other specified B group vitamins: Secondary | ICD-10-CM | POA: Diagnosis not present

## 2016-11-10 MED ORDER — MORPHINE SULFATE 30 MG PO TABS: 30.0000 mg | ORAL_TABLET | Freq: Four times a day (QID) | ORAL | 0 refills | Status: DC | PRN

## 2016-11-10 MED ORDER — MORPHINE SULFATE ER 100 MG PO TBCR: 100.0000 mg | EXTENDED_RELEASE_TABLET | Freq: Three times a day (TID) | ORAL | 0 refills | Status: DC

## 2016-11-10 NOTE — Patient Instructions (Signed)
Please see Dr Jannifer Franklin for peripheral neuropathy follow up

## 2016-11-10 NOTE — Progress Notes (Signed)
Subjective:    Patient ID: [name], male    DOB: 09-07-1960, 56 y.o.   MRN: [id number]  HPI   Pain Inventory  Neuro/Psych    Family History  Problem Relation Age of Onset  . Heart disease Father 73    Died of MI  . Cancer Mother    Social History   Social History  . Marital status: Married    Spouse name: N/A  . Number of children: 3  . Years of education: N/A   Social History Main Topics  . Smoking status: Current Some Day Smoker    Packs/day: 1.50    Years: 29.00    Types: Cigarettes, E-cigarettes  . Smokeless tobacco: Never Used     Comment: PT STOPPED SMOKING CIGS 03/27/13. NOW CURRENTLY USING E-CIG and smoking a few cigerettes per week.  . Alcohol use No  . Drug use: Yes    Types: Marijuana     Comment: Urine showed THC  . Sexual activity: Not Asked   Other Topics Concern  . None   Social History Narrative   Marital status: married x 12 years; wife is severe alcoholic.      Children: 3 married daughters (23, 56, 68); 1 son (26yo); 3 grandchildren born in 2017      Lives: with wife, son      Left-handed   Caffeine: 3 drinks per day   Past Surgical History:  Procedure Laterality Date  . ASD REPAIR, SINUS VENOSUS    . CARDIAC DEFIBRILLATOR PLACEMENT  08/2010  . HERNIA REPAIR    . LEFT HEART CATHETERIZATION WITH CORONARY ANGIOGRAM N/A 06/14/2014   Procedure: LEFT HEART CATHETERIZATION WITH CORONARY ANGIOGRAM;  Surgeon: Jolaine Artist, MD;  Location: [facility] CATH LAB;  Service: Cardiovascular;  Laterality: N/A;  . TESTICLE SURGERY     testicular cancer surgery   Past Medical History:  Diagnosis Date  . Anxiety   . Automatic implantable cardiac defibrillator [employer]    Analyze ST  . Benign prostatic hypertrophy   . Benzodiazepine dependence (HCC)    chronic  . CAD (coronary artery disease)    Last cath 2/12. 3-v CAD. Failed PCI of distal RCAc  CATH [facility] 4/13 with DES to  LAD X2  . CHF (congestive heart failure) ([place])   . Chronic back pain      lumbar stenosis  . Chronic systolic heart failure (HCC)    EF 20-25%. s/p ST. Jude ICD  . Depression   . DJD (degenerative joint disease)   . History of testicular cancer   . Narcotic dependence (HCC)    chronic  . Vitamin B12 deficiency 07/22/2016   There were no vitals taken for this visit.  Opioid Risk Score:   Fall Risk Score:  `1  Depression screen PHQ 2/9  Depression screen [facility] 2/9 07/01/2016 04/08/2016 02/13/2016 01/07/2016 12/26/2015 10/01/2015 06/16/2015  Decreased Interest 0 0 1 0 0 1 0  Down, Depressed, Hopeless 1 0 0 0 0 0 0  PHQ - 2 Score 1 0 1 0 0 1 0  Altered sleeping - - - - - - -  Tired, decreased energy - - - - - - -  Change in appetite - - - - - - -  Feeling bad or failure about yourself  - - - - - - -  Trouble concentrating - - - - - - -  Moving slowly or fidgety/restless - - - - - - -  Suicidal thoughts - - - - - - -  PHQ-9 Score - - - - - - -  Some recent data might be hidden    Review of Systems     Objective:   Physical Exam        Assessment & Plan:

## 2016-11-10 NOTE — Progress Notes (Addendum)
Subjective:    Patient ID: [name], male    DOB: 11/16/1960, 56 y.o.   MRN: [id number]  HPI  56 year old male with severe multilevel lumbar stenosis and chronic neurogenic claudication, who returns today to risk discuss results of his EMG. He is complained of some increasing bilateral lower extremity numbness, mainly in the feet. He's had no falls or trauma. He has been on chronic high-dose narcotic analgesics since he is not an operative candidate secondary to cardiomyopathy and coronary artery disease. He follows with cardiology at [facility]  Seen by cardiology clear for dental procedure  Pain Inventory Average Pain 7 Pain Right Now 6 My pain is sharp, burning, stabbing and tingling  In the last 24 hours, has pain interfered with the following? General activity 4 Relation with others 5 Enjoyment of life 6 What TIME of day is your pain at its worst? daytime, evening  Sleep (in general) Fair  Pain is worse with: walking, bending, standing and some activites Pain improves with: medication Relief from Meds: 6  Mobility walk with assistance use a walker how many minutes can you walk? 5-10 ability to climb steps?  yes do you drive?  yes Do you have any goals in this area?  yes  Function I need assistance with the following:  household duties and shopping  Neuro/Psych numbness trouble walking anxiety  Prior Studies Any changes since last visit?  no  Physicians involved in your care Any changes since last visit?  no   Family History  Problem Relation Age of Onset  . Heart disease Father 65    Died of MI  . Cancer Mother    Social History   Social History  . Marital status: Married    Spouse name: N/A  . Number of children: 3  . Years of education: N/A   Social History Main Topics  . Smoking status: Current Some Day Smoker    Packs/day: 1.50    Years: 29.00    Types: Cigarettes, E-cigarettes  . Smokeless tobacco: Never Used     Comment: PT STOPPED SMOKING CIGS 03/27/13. NOW CURRENTLY USING E-CIG and smoking a few cigerettes per week.  . Alcohol use No  . Drug use: Yes    Types: Marijuana     Comment: Urine showed THC  . Sexual activity: Not Asked   Other Topics Concern  . None   Social History Narrative   Marital status: married x 12 years; wife is severe alcoholic.      Children: 3 married daughters (23, 32, 13); 1 son (34yo); 3 grandchildren born in 2017      Lives: with wife, son      Left-handed   Caffeine: 3 drinks per day   Past Surgical History:  Procedure Laterality Date  . ASD REPAIR, SINUS VENOSUS    . CARDIAC DEFIBRILLATOR PLACEMENT  08/2010  . HERNIA REPAIR    . LEFT HEART CATHETERIZATION WITH CORONARY ANGIOGRAM N/A 06/14/2014   Procedure: LEFT HEART CATHETERIZATION WITH CORONARY ANGIOGRAM;  Surgeon: Jolaine Artist, MD;  Location: [facility] CATH LAB;  Service: Cardiovascular;  Laterality: N/A;  . TESTICLE SURGERY     testicular cancer surgery   Past Medical History:  Diagnosis Date  . Anxiety   . Automatic implantable cardiac defibrillator [employer] [facility]    Analyze ST  . Benign prostatic hypertrophy   . Benzodiazepine dependence (HCC)    chronic  . CAD (coronary artery disease)    Last cath 2/12.  3-v CAD. Failed PCI of distal RCAc  CATH [facility] 4/13 with DES to  LAD X2  . CHF (congestive heart failure) ([place])   . Chronic back pain    lumbar stenosis  . Chronic systolic heart failure (HCC)    EF 20-25%. s/p ST. Jude ICD  . Depression   . DJD (degenerative joint disease)   . History of testicular cancer   . Narcotic dependence (HCC)    chronic  . Vitamin B12 deficiency 07/22/2016   There were no vitals taken for this visit.  Opioid Risk Score:   Fall Risk Score:  `1  Depression screen PHQ 2/9  Depression screen [facility] 2/9 07/01/2016 04/08/2016 02/13/2016 01/07/2016 12/26/2015 10/01/2015 06/16/2015  Decreased Interest 0 0 1 0 0 1 0  Down, Depressed, Hopeless 1 0 0 0 0 0 0  PHQ - 2 Score 1 0 1 0 0  1 0  Altered sleeping - - - - - - -  Tired, decreased energy - - - - - - -  Change in appetite - - - - - - -  Feeling bad or failure about yourself  - - - - - - -  Trouble concentrating - - - - - - -  Moving slowly or fidgety/restless - - - - - - -  Suicidal thoughts - - - - - - -  PHQ-9 Score - - - - - - -  Some recent data might be hidden    Review of Systems  Constitutional: Negative.   HENT: Negative.   Eyes: Negative.   Respiratory: Negative.   Cardiovascular: Negative.   Gastrointestinal: Negative.   Endocrine: Negative.   Genitourinary: Negative.   Musculoskeletal: Positive for back pain and gait problem.  Skin: Negative.   Allergic/Immunologic: Negative.   Neurological: Positive for numbness.  Hematological: Negative.   Psychiatric/Behavioral: The patient is nervous/anxious.   All other systems reviewed and are negative.      Objective:   Physical Exam  Constitutional: He is oriented to person, place, and time. He appears well-developed and well-nourished.  HENT:  Head: Normocephalic and atraumatic.  Eyes: Conjunctivae and EOM are normal. Pupils are equal, round, and reactive to light.  Neck: Normal range of motion.  Neurological: He is alert and oriented to person, place, and time. He displays atrophy. He displays no tremor. He exhibits abnormal muscle tone. Gait abnormal.  Reflex Scores:      Patellar reflexes are 1+ on the right side and 1+ on the left side.      Achilles reflexes are 0 on the right side and 0 on the left side. Motor strength is 4 minus bilateral total flexion and extension, 5 bilateral ankle dorsiflexors, plantar flexor 5, bilateral knee extensors and hip flexors. There is atrophy of the foot intrinsic musculature.  Ambulates with walker stenotic gait  Psychiatric: He has a normal mood and affect.  Nursing note and vitals reviewed.   L4 and L5 sensory def bilateral      Assessment & Plan:   1. Chronic severe lumbar spinal stenosis  with neurogenic claudication, he remains functional on high-dose narcotic analgesics, Continue MS Contin 100 mg 3 times a day Continue MSIR 30 g 4 times a day  Continue opioid monitoring program. This consists of regular clinic visits, examinations, urine drug screen, pill counts as well as use of [place] controlled substance reporting System.  Repeat urine drug screen today  2. Chronic radiculopathy affecting L4 and L5 dermatomes and myotomes  primarily. He also has evidence of peripheral neuropathy with absent superficial peroneal on the right and decreased amplitude of the right sural. He does have a history of borderline diabetes as well as B12 deficiency. We will reorder B12 level, he will follow up with neurology on this. He has been on supplementation the past as ordered by neurology, Dr Jannifer Franklin  PMR f/u in 1 mo  Lab work was reviewed and discussed with patient over the phone.  Blood work normal with exception of his chronic polycythemia.    Ordered CBC to follow up on polycythemia, his cardiologist, try to order yesterday, but the computer system at [facility], went down yesterday. Also followed up on testosterone level as this can also be affected by his chronic narcotic analgesics.  11/10/2016 Urine drug screen results positive cocaine, patient denies use discussed with patient gradual taperof morphine  over 6 months  Also , morphine and Valium were detected, but no metabolites. Toxicology is questions whether these medications are being ingested and recommended witnessed ingestion, followed by UDS. 2 hours later.  This was communicated to the patient over the phone after the results were available. Patient was instructed to come into the office for this testing and scheduled on multiple occasions, but canceled each time. Told office manager Zorita Pang that he was on his last tablet of morphine during a phone conversation during the first week in June. Patient  did not contact the office with any withdrawal reaction, review of Epic showed no ED visits. Subsequent phone calls with office staff make no mention of going through withdrawals , which would be expected after discontinuation of high-dose opiates.  Discharge letter sent from [facility] for noncompliance with office policies.

## 2016-11-11 LAB — CBC WITH DIFFERENTIAL/PLATELET
Basophils Absolute: 0 10*3/uL (ref 0.0–0.2)
Basos: 0 %
EOS (ABSOLUTE): 0.3 10*3/uL (ref 0.0–0.4)
Eos: 4 %
Hematocrit: 49.3 % (ref 37.5–51.0)
Hemoglobin: 17.8 g/dL — ABNORMAL HIGH (ref 13.0–17.7)
Lymphocytes Absolute: 1.6 10*3/uL (ref 0.7–3.1)
Lymphs: 22 %
MCH: 33.1 pg — ABNORMAL HIGH (ref 26.6–33.0)
MCHC: 36.1 g/dL — ABNORMAL HIGH (ref 31.5–35.7)
MCV: 92 fL (ref 79–97)
Monocytes Absolute: 0.7 10*3/uL (ref 0.1–0.9)
Monocytes: 10 %
Neutrophils Absolute: 4.5 10*3/uL (ref 1.4–7.0)
Neutrophils: 64 %
Platelets: 175 10*3/uL (ref 150–379)
RBC: 5.38 x10E6/uL (ref 4.14–5.80)
RDW: 14.5 % (ref 12.3–15.4)
WBC: 7 10*3/uL (ref 3.4–10.8)

## 2016-11-13 LAB — B12 AND FOLATE PANEL
Folate: 4.9 ng/mL (ref >3.0)
Vitamin B-12: 286 pg/mL (ref 232–1245)

## 2016-11-13 LAB — 6-ACETYLMORPHINE,TOXASSURE ADD
6-ACETYLMORPHINE: NEGATIVE
6-acetylmorphine: NOT DETECTED ng/mg creat

## 2016-11-13 LAB — TESTOSTERONE: Testosterone: 403 ng/dL (ref 264–916)

## 2016-11-13 LAB — TOXASSURE SELECT,+ANTIDEPR,UR

## 2016-11-16 ENCOUNTER — Telehealth: Payer: Self-pay | Admitting: *Deleted

## 2016-11-16 DIAGNOSIS — G4733 Obstructive sleep apnea (adult) (pediatric): Secondary | ICD-10-CM | POA: Diagnosis not present

## 2016-11-16 NOTE — Telephone Encounter (Signed)
Urine drug screen for this encounter is inconsistent.  Cocaine and metabolite BG is present.

## 2016-11-17 NOTE — Telephone Encounter (Signed)
Discussed with pt who denies use.  Pt would like cut off values, please call [facility].  Will place on wean until pt finds another MD.Pt requests another screen which is ok although results will not change management plan.

## 2016-11-18 ENCOUNTER — Telehealth: Payer: Self-pay | Admitting: Physical Medicine & Rehabilitation

## 2016-11-18 ENCOUNTER — Telehealth: Payer: Self-pay | Admitting: *Deleted

## 2016-11-18 NOTE — Telephone Encounter (Signed)
I called and spoke to [facility] about the urine test assession # [id number]. I verified that the test went through the two step process. The assay tested positive for cocaine and then the confirmation process was completed and verification obtained with the benzoylecgonine metabolite.

## 2016-11-18 NOTE — Telephone Encounter (Signed)
Patient contacted office to advise he will be taking another screen - asked that AK order it - he will be here tomorrow as AK is in office.  Questions chain of custody process for [facility] - I advised I would notify AK.

## 2016-11-26 ENCOUNTER — Other Ambulatory Visit (HOSPITAL_COMMUNITY): Payer: Self-pay | Admitting: Internal Medicine

## 2016-12-01 ENCOUNTER — Other Ambulatory Visit (HOSPITAL_COMMUNITY): Payer: Self-pay | Admitting: *Deleted

## 2016-12-01 MED ORDER — EZETIMIBE 10 MG PO TABS: 10.0000 mg | ORAL_TABLET | Freq: Every day | ORAL | 3 refills | Status: DC

## 2016-12-01 MED ORDER — CARVEDILOL 25 MG PO TABS: 25.0000 mg | ORAL_TABLET | Freq: Two times a day (BID) | ORAL | 3 refills | Status: DC

## 2016-12-01 MED ORDER — ROSUVASTATIN CALCIUM 40 MG PO TABS: 40.0000 mg | ORAL_TABLET | Freq: Every day | ORAL | 3 refills | Status: DC

## 2016-12-01 MED ORDER — LISINOPRIL 2.5 MG PO TABS: 2.5000 mg | ORAL_TABLET | Freq: Every day | ORAL | 3 refills | Status: DC

## 2016-12-02 ENCOUNTER — Telehealth: Payer: Self-pay | Admitting: Physical Medicine & Rehabilitation

## 2016-12-02 NOTE — Telephone Encounter (Signed)
Called to advise ptn next visit will be controlled UDS - because patient requested 2nd screening. To confirm time ( 3 hour in office will work (per AK request) - please move him earlier in week - not June 1 per LLK  Left voicemail to return call to myself

## 2016-12-02 NOTE — Telephone Encounter (Signed)
[name] called and left a long rambling message that he is in [place] due to his daughter having a baby.  He is "expecting good news from the repeat off the urine sample that was given ( I do not believe that was an option) and sees no need to do a repeat test. But he says he would be glad to do another urine test but objects to the oral swab. Will discuss the voicemail with Lattie Haw tomorrow.

## 2016-12-04 NOTE — Telephone Encounter (Signed)
[facility] appt appt has been scheduled for 12/10/16 and Lattie Haw is discussing call with Dr Letta Pate.

## 2016-12-10 ENCOUNTER — Encounter: Payer: BLUE CROSS/BLUE SHIELD | Admitting: Registered Nurse

## 2016-12-10 ENCOUNTER — Telehealth: Payer: Self-pay | Admitting: Physical Medicine & Rehabilitation

## 2016-12-10 NOTE — Telephone Encounter (Signed)
PHONED HOME LEFT DETAILED MESSAGE - PER AK - NEED TO BE HERE TODAY AT NOON TAKE MEDS IN OUR PRESENCE 2 HOURS LATER DO UDS AND MOUTH SWAB ALONG WITH APPT

## 2016-12-11 ENCOUNTER — Ambulatory Visit: Payer: Self-pay | Admitting: Registered Nurse

## 2016-12-11 ENCOUNTER — Ambulatory Visit: Payer: Self-pay | Admitting: Physical Medicine & Rehabilitation

## 2016-12-11 DIAGNOSIS — G4733 Obstructive sleep apnea (adult) (pediatric): Secondary | ICD-10-CM | POA: Diagnosis not present

## 2016-12-11 DIAGNOSIS — G4737 Central sleep apnea in conditions classified elsewhere: Secondary | ICD-10-CM | POA: Diagnosis not present

## 2016-12-14 ENCOUNTER — Encounter: Payer: BLUE CROSS/BLUE SHIELD | Admitting: Registered Nurse

## 2016-12-15 ENCOUNTER — Ambulatory Visit: Payer: BLUE CROSS/BLUE SHIELD | Admitting: Registered Nurse

## 2016-12-15 ENCOUNTER — Telehealth: Payer: Self-pay | Admitting: Physical Medicine & Rehabilitation

## 2016-12-15 NOTE — Telephone Encounter (Signed)
[name] called and advised he is [name] care taker and needed to reschedule - I advised I cannot discuss anything about [facility] as I'm unaware of a caretaker - if he is calling for or speaking for patient - the patient needs to contact office in person

## 2016-12-15 NOTE — Telephone Encounter (Signed)
Received message to contact corey casey in reference to [place] appt -[phone] left voicemail for ccasey - called [facility] home number left voicemail to return call about missed appointments.

## 2016-12-26 ENCOUNTER — Telehealth: Payer: Self-pay | Admitting: Pulmonary Disease

## 2016-12-26 NOTE — Telephone Encounter (Signed)
Called by Nicki Reaper who relates several episodes of hemoptysis and dizziness today. I recommended that he come to the Emergency Department for further evaluation and possible admission to the hospital. He states that he can't come to the Hospital at this time. He says that he wants a prescription for an antibiotic as he believes that this is related to bronchitis. I told him that I couldn't make a diagnosis for his hemoptysis over the phone. He says he has Doxycycline at home. He is also on Plavix. I suggested that as can't come to the hospital this evening that he take the Doxycycline 1 tab PO BID and hold his Plavix. He should call the PCCM office first thing Monday morning for a follow up appointment. However, this is alternative management and it may not actually be treating what is actually causing his hemoptysis. He voiced an understanding of what I had told him.  I again stated that he should come the the Emergency Department for further evaluation. He again stated that he could not come to the Emergency Department.

## 2016-12-28 DIAGNOSIS — F4322 Adjustment disorder with anxiety: Secondary | ICD-10-CM | POA: Diagnosis not present

## 2016-12-29 ENCOUNTER — Encounter: Payer: Self-pay | Admitting: Physical Medicine & Rehabilitation

## 2016-12-30 ENCOUNTER — Inpatient Hospital Stay (HOSPITAL_COMMUNITY)
Admission: RE | Admit: 2016-12-30 | Discharge: 2016-12-30 | Disposition: A | Discharge status: Home / Self Care | Payer: Self-pay | Source: Ambulatory Visit | Attending: Internal Medicine | Admitting: Internal Medicine

## 2016-12-30 ENCOUNTER — Other Ambulatory Visit: Payer: Self-pay | Admitting: *Deleted

## 2016-12-30 ENCOUNTER — Telehealth: Payer: Self-pay | Admitting: Physical Medicine & Rehabilitation

## 2016-12-30 NOTE — Telephone Encounter (Signed)
Opened in error

## 2016-12-30 NOTE — Telephone Encounter (Signed)
MAILED [place]

## 2017-01-01 ENCOUNTER — Ambulatory Visit (INDEPENDENT_AMBULATORY_CARE_PROVIDER_SITE_OTHER)
Admission: RE | Admit: 2017-01-01 | Discharge: 2017-01-01 | Disposition: A | Discharge status: Home / Self Care | Payer: BLUE CROSS/BLUE SHIELD | Source: Ambulatory Visit | Attending: Internal Medicine | Admitting: Internal Medicine

## 2017-01-01 ENCOUNTER — Encounter: Payer: Self-pay | Admitting: Internal Medicine

## 2017-01-01 ENCOUNTER — Other Ambulatory Visit (INDEPENDENT_AMBULATORY_CARE_PROVIDER_SITE_OTHER): Payer: BLUE CROSS/BLUE SHIELD

## 2017-01-01 ENCOUNTER — Ambulatory Visit (INDEPENDENT_AMBULATORY_CARE_PROVIDER_SITE_OTHER): Payer: BLUE CROSS/BLUE SHIELD | Admitting: Internal Medicine

## 2017-01-01 ENCOUNTER — Telehealth: Payer: Self-pay | Admitting: Pulmonary Disease

## 2017-01-01 VITALS — BP 132/90 | HR 72 | Temp 98.2°F | Ht 70.25 in | Wt 210.0 lb

## 2017-01-01 DIAGNOSIS — R918 Other nonspecific abnormal finding of lung field: Secondary | ICD-10-CM | POA: Diagnosis not present

## 2017-01-01 DIAGNOSIS — J449 Chronic obstructive pulmonary disease, unspecified: Secondary | ICD-10-CM | POA: Diagnosis not present

## 2017-01-01 DIAGNOSIS — R042 Hemoptysis: Secondary | ICD-10-CM

## 2017-01-01 LAB — CBC WITH DIFFERENTIAL/PLATELET
Basophils Absolute: 0.1 10*3/uL (ref 0.0–0.1)
Basophils Relative: 0.9 % (ref 0.0–3.0)
Eosinophils Absolute: 0.1 10*3/uL (ref 0.0–0.7)
Eosinophils Relative: 0.6 % (ref 0.0–5.0)
HCT: 52.9 % — ABNORMAL HIGH (ref 39.0–52.0)
Hemoglobin: 17.6 g/dL — ABNORMAL HIGH (ref 13.0–17.0)
Lymphocytes Relative: 11.3 % — ABNORMAL LOW (ref 12.0–46.0)
Lymphs Abs: 1.4 10*3/uL (ref 0.7–4.0)
MCHC: 33.2 g/dL (ref 30.0–36.0)
MCV: 97.1 fl (ref 78.0–100.0)
Monocytes Absolute: 0.6 10*3/uL (ref 0.1–1.0)
Monocytes Relative: 5.2 % (ref 3.0–12.0)
Neutro Abs: 10.2 10*3/uL — ABNORMAL HIGH (ref 1.4–7.7)
Neutrophils Relative %: 82 % — ABNORMAL HIGH (ref 43.0–77.0)
Platelets: 241 10*3/uL (ref 150.0–400.0)
RBC: 5.44 Mil/uL (ref 4.22–5.81)
RDW: 15.5 % (ref 11.5–15.5)
WBC: 12.4 10*3/uL — ABNORMAL HIGH (ref 4.0–10.5)

## 2017-01-01 LAB — BASIC METABOLIC PANEL
BUN: 8 mg/dL (ref 6–23)
CO2: 33 mEq/L — ABNORMAL HIGH (ref 19–32)
Calcium: 9.2 mg/dL (ref 8.4–10.5)
Chloride: 99 mEq/L (ref 96–112)
Creatinine, Ser: 0.94 mg/dL (ref 0.40–1.50)
GFR: 87.88 mL/min (ref >60.00)
Glucose, Bld: 123 mg/dL — ABNORMAL HIGH (ref 70–99)
Potassium: 3.5 mEq/L (ref 3.5–5.1)
Sodium: 140 mEq/L (ref 135–145)

## 2017-01-01 MED ORDER — LEVOFLOXACIN 500 MG PO TABS: 500.0000 mg | ORAL_TABLET | Freq: Every day | ORAL | 0 refills | Status: DC

## 2017-01-01 NOTE — Telephone Encounter (Signed)
Spoke with pt. He is aware of MW's response. Pt has been scheduled with MW at 4:30pm today. States that he has not taken ASA or Plavix since this started.

## 2017-01-01 NOTE — Progress Notes (Signed)
Subjective:     Patient ID: [name], male   DOB: [birth year], 56 y.o.   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 though last ct chest 11/05/15 nl / tendency to "recurrent severe  bronchitis" on ACEi and acute onset of  cough x 5 days mucoid then frank hemoptysis 12/23/16 up to 4 tbsp   While on plavix/asa/ stopped it and acei  on 12/25/16 rx doxy x 5 days >  Seemed to improve over the 5 days then worse again starting 6/20 also felt weak/ recurrent am of ov x sev tbsp / no fever/ requested ov so seen as an  Acute work in visit  No obvious day to day or daytime patterns in variability or assoc sob excess/ purulent sputum or mucus plugs    or cp or chest tightness, subjective wheeze or overt sinus or hb symptoms. No unusual exp hx or h/o childhood pna/ asthma or knowledge of premature birth.  Sleeping ok without nocturnal  or early am exacerbation  of respiratory  c/o's or need for noct saba. Also denies any obvious fluctuation of symptoms with weather or environmental changes or other aggravating or alleviating factors except as outlined above   Current Medications, Allergies, Complete Past Medical History, Past Surgical History, Family History, and Social History were reviewed in [employer] record.  ROS  The following are not active complaints unless bolded sore throat, dysphagia, dental problems, itching, sneezing,  nasal congestion or excess/ purulent secretions, ear ache,   fever, chills, sweats, unintended wt loss, classically pleuritic or exertional cp,  orthopnea pnd or leg swelling, presyncope, palpitations, abdominal pain, anorexia, nausea, vomiting, diarrhea  or change in bowel or bladder habits, change in stools or urine, dysuria,hematuria,  rash, arthralgias, visual complaints, headache, numbness, weakness/fatigue or ataxia or problems with walking or coordination,  change in mood/affect or memory.                 Review of Systems     Objective:   Physical Exam    animated talkative amb wm nad - calls all his doctors by their first names   Wt Readings from Last 3 Encounters:  01/01/17 210 lb (95.3 kg)  07/22/16 215 lb (97.5 kg)  07/01/16 219 lb (99.3 kg)    Vital signs reviewed - - Note on arrival 02 sats  98% on RA and pulse 72 off acei x 7 days     HEENT: nl dentition, turbinates bilaterally, and oropharynx. Nl external ear canals without cough reflex   NECK :  without JVD/Nodes/TM/ nl carotid upstrokes bilaterally   LUNGS: no acc muscle use,  Nl contour chest which is clear to A and P bilaterally without cough on insp or exp maneuvers   CV:  RRR  no s3 or murmur or increase in P2, and no edema   ABD:  soft and nontender with nl inspiratory excursion in the supine position. No bruits or organomegaly appreciated, bowel sounds nl  MS:  Nl gait/ ext warm without deformities, calf tenderness, cyanosis or clubbing No obvious joint restrictions   SKIN: warm and dry without lesions    NEURO:  alert, approp, nl sensorium with  no motor or cerebellar deficits apparent.    CXR PA and Lateral:   01/01/2017 :    I personally reviewed images and agree with radiology impression as follows:    1. Patchy posterior basilar  opacity, likely in the right lower lobe and which may reflect pneumonia, aspiration, or hemorrhage. 2. Chronic bronchitic changes.  Labs ordered/ reviewed:      Chemistry      Component Value Date/Time   NA 140 01/01/2017 1709   NA 143 07/01/2016 1459   K 3.5 01/01/2017 1709   CL 99 01/01/2017 1709   CO2 33 (H) 01/01/2017 1709   BUN 8 01/01/2017 1709   BUN 8 07/01/2016 1459   CREATININE 0.94 01/01/2017 1709   CREATININE 0.97 12/04/2014 1644      Component Value Date/Time   CALCIUM 9.2 01/01/2017 1709   ALKPHOS 69 07/01/2016 1459   AST 12 07/01/2016 1459   ALT 11 07/01/2016 1459   BILITOT 0.4 07/01/2016 1459        Lab Results  Component Value Date    WBC 12.4 (H) 01/01/2017   HGB 17.6 (H) 01/01/2017   HCT 52.9 (H) 01/01/2017   MCV 97.1 01/01/2017   PLT 241.0 01/01/2017         Assessment:

## 2017-01-01 NOTE — Telephone Encounter (Signed)
Called and spoke with pt and he stated that on Friday 6/15 he started with a cough that produced bright red blood.  He did call his PCP and they started him on doxy--he stated that the blood stopped on 6/17.  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  Pt stated that he would like to be seen this afternoon.  He stated that he is a single parent to his child and has no help--he cannot go to the hospital at this time.  MW please advise since VS is off.  Thanks

## 2017-01-01 NOTE — Patient Instructions (Addendum)
Please remember to go to the lab department downstairs in the basement  for your tests - we will call you with the results when they are available.     levaquin 500 mg daily x 7 days  No asa or plavix until no blood x 3 days and then start back on one or the other x 3 days before the second one  Late add:  Ov 2 weeks for recheck and strongly rec he change off acei to arb permanently per provider of acei

## 2017-01-01 NOTE — Telephone Encounter (Signed)
Ok to add on end of day with cxr  Will need to d/c all asa until no more hemoptysis so don't take today

## 2017-01-02 ENCOUNTER — Encounter: Payer: Self-pay | Admitting: Internal Medicine

## 2017-01-02 DIAGNOSIS — J449 Chronic obstructive pulmonary disease, unspecified: Secondary | ICD-10-CM | POA: Insufficient documentation

## 2017-01-02 NOTE — Assessment & Plan Note (Addendum)
Onset 12/25/16  - rx doxy thru 6/120 then levaquin 01/01/17 x 7 d @ 500 mg /day   Most likely this is CAP partially resp to doxy so reasonable to try levaquin x 500 x 7 days and f/u at 2 weeks with ddx unfortunately including lung ca in pt with smoking hx and needs to hold asa / plavix until no active bleeding x 3 days then start one back followed by the other 3 days later  Discussed in detail all the  indications, usual  risks and alternatives  relative to the benefits with patient who agrees to proceed with rx as outlined .  I had an extended discussion with the patient reviewing all relevant studies (extensive records/multiple sources of care) completed to date and  lasting 25 minutes of a 40  minute acute visit with pt not prev known to me    re  severe non-specific but potentially very serious refractory respiratory symptoms of uncertain and potentially multiple  etiologies.   Each maintenance medication was reviewed in detail including most importantly the difference between maintenance and prns and under what circumstances the prns are to be triggered using an action plan format that is not reflected in the computer generated alphabetically organized AVS.    Please see AVS for specific instructions unique to this office visit that I personally wrote and verbalized to the the pt in detail and then reviewed with pt  by my nurse highlighting any changes in therapy/plan of care  recommended at today's visit.

## 2017-01-02 NOTE — Assessment & Plan Note (Signed)
PFTS [facility]  11/08/13 wnl including dlco except for mild curvature to f/v loop c/w small airways dz  Symptoms of "severe recurrent bronchitis"re markedly disproportionate to objective findings and not clear this is actually much of a  lung problem but pt does appear to have difficult to sort out respiratory symptoms of unknown origin for which  DDX  = almost all start with A and  include Adherence, Ace Inhibitors, Acid Reflux, Active Sinus Disease, Alpha 1 Antitripsin deficiency, Anxiety masquerading as Airways dz,  ABPA,  Allergy(esp in young), Aspiration (esp in elderly), Adverse effects of meds,  Active smokers, A bunch of PE's (a small clot burden can't cause this syndrome unless there is already severe underlying pulm or vascular dz with poor reserve) plus two Bs  = Bronchiectasis and Beta blocker use..and one C= CHF    Adherence is always the initial "prime suspect" and is a multilayered concern that requires a "trust but verify" approach in every patient - starting with knowing how to use medications, especially inhalers, correctly, keeping up with refills and understanding the fundamental difference between maintenance and prns vs those medications only taken for a very short course and then stopped and not refilled.  - he tends to ad lib on his meds - strongly rec return to ov's with all meds in hand using a trust but verify approach to confirm accurate Medication  Reconciliation The principal here is that until we are certain that the  patients are doing what we've asked, it makes no sense to ask them to do more.    ACEi adverse effects at the  top of the usual list of suspects and the only way to rule it out is a trial off > see hbp a/p  - he is holding the acei now but was advised to check with whoever is filling it to consider substitute  ? Anxiety > usually at the bottom of this list of usual suspects but should be much higher on this pt's based on H and P and note already on multiple  psychotropics  And this may become a barrier to adherence, especially if multiple centers involved in his care.   I had an extended discussion with the patient reviewing all relevant studies completed to date and  lasting 25 minutes of a 40  minute acute office visit in pt not previously known to me     re  severe non-specific but potentially very serious refractory respiratory symptoms of uncertain and potentially multiple  etiologies.   Each maintenance medication was reviewed in detail including most importantly the difference between maintenance and prns and under what circumstances the prns are to be triggered using an action plan format that is not reflected in the computer generated alphabetically organized AVS.    Please see AVS for specific instructions unique to this office visit that I personally wrote and verbalized to the the pt in detail and then reviewed with pt  by my nurse highlighting any changes in therapy/plan of care  recommended at today's visit.

## 2017-01-04 ENCOUNTER — Telehealth: Payer: Self-pay | Admitting: *Deleted

## 2017-01-04 NOTE — Telephone Encounter (Signed)
-----   Message from Tanda Rockers, MD sent at 01/02/2017  6:02 AM EDT ----- Late add:  Ov 2 weeks for recheck and strongly rec he change off acei to arb permanently per provider of acei

## 2017-01-04 NOTE — Telephone Encounter (Signed)
ATC, NA and no option to leave msg, WCB

## 2017-01-04 NOTE — Progress Notes (Signed)
ATC, NA and no option to leave msg 

## 2017-01-05 NOTE — Telephone Encounter (Signed)
ATC, NA and no option to leave msg 

## 2017-01-05 NOTE — Progress Notes (Signed)
ATC, NA and no option to leave msg 

## 2017-01-06 NOTE — Progress Notes (Signed)
LMTCB

## 2017-01-06 NOTE — Telephone Encounter (Signed)
ATC, NA 

## 2017-01-07 ENCOUNTER — Encounter: Payer: Self-pay | Admitting: *Deleted

## 2017-01-07 ENCOUNTER — Telehealth: Payer: Self-pay | Admitting: Internal Medicine

## 2017-01-07 NOTE — Telephone Encounter (Signed)
Spoke with the pt  I informed him of his lab results, and that MW wanted to see him back in 2 wks  He reports not feeling much better, not having hemoptysis, but still coughing up dark sputum  OV at 1:30 pm with MW

## 2017-01-07 NOTE — Telephone Encounter (Signed)
LMTCB and letter mailed

## 2017-01-07 NOTE — Progress Notes (Signed)
Letter mailed to the pt. 

## 2017-01-08 ENCOUNTER — Ambulatory Visit (INDEPENDENT_AMBULATORY_CARE_PROVIDER_SITE_OTHER)
Admission: RE | Admit: 2017-01-08 | Discharge: 2017-01-08 | Disposition: A | Discharge status: Home / Self Care | Payer: BLUE CROSS/BLUE SHIELD | Source: Ambulatory Visit | Attending: Internal Medicine | Admitting: Internal Medicine

## 2017-01-08 ENCOUNTER — Ambulatory Visit (INDEPENDENT_AMBULATORY_CARE_PROVIDER_SITE_OTHER): Payer: BLUE CROSS/BLUE SHIELD | Admitting: Internal Medicine

## 2017-01-08 ENCOUNTER — Other Ambulatory Visit (INDEPENDENT_AMBULATORY_CARE_PROVIDER_SITE_OTHER): Payer: BLUE CROSS/BLUE SHIELD

## 2017-01-08 ENCOUNTER — Encounter: Payer: Self-pay | Admitting: Internal Medicine

## 2017-01-08 VITALS — BP 130/82 | HR 70 | Ht 70.5 in | Wt 210.0 lb

## 2017-01-08 DIAGNOSIS — J449 Chronic obstructive pulmonary disease, unspecified: Secondary | ICD-10-CM

## 2017-01-08 DIAGNOSIS — R0600 Dyspnea, unspecified: Secondary | ICD-10-CM

## 2017-01-08 DIAGNOSIS — I5042 Chronic combined systolic (congestive) and diastolic (congestive) heart failure: Secondary | ICD-10-CM

## 2017-01-08 DIAGNOSIS — F17210 Nicotine dependence, cigarettes, uncomplicated: Secondary | ICD-10-CM

## 2017-01-08 DIAGNOSIS — R0609 Other forms of dyspnea: Secondary | ICD-10-CM | POA: Diagnosis not present

## 2017-01-08 DIAGNOSIS — R042 Hemoptysis: Secondary | ICD-10-CM

## 2017-01-08 DIAGNOSIS — R7989 Other specified abnormal findings of blood chemistry: Secondary | ICD-10-CM | POA: Insufficient documentation

## 2017-01-08 DIAGNOSIS — R946 Abnormal results of thyroid function studies: Secondary | ICD-10-CM

## 2017-01-08 LAB — CBC WITH DIFFERENTIAL/PLATELET
Basophils Absolute: 0 10*3/uL (ref 0.0–0.1)
Basophils Relative: 0.7 % (ref 0.0–3.0)
Eosinophils Absolute: 0.1 10*3/uL (ref 0.0–0.7)
Eosinophils Relative: 1.6 % (ref 0.0–5.0)
HCT: 52.6 % — ABNORMAL HIGH (ref 39.0–52.0)
Hemoglobin: 17.9 g/dL — ABNORMAL HIGH (ref 13.0–17.0)
Lymphocytes Relative: 18.3 % (ref 12.0–46.0)
Lymphs Abs: 1.2 10*3/uL (ref 0.7–4.0)
MCHC: 34 g/dL (ref 30.0–36.0)
MCV: 96.8 fl (ref 78.0–100.0)
Monocytes Absolute: 0.7 10*3/uL (ref 0.1–1.0)
Monocytes Relative: 10.3 % (ref 3.0–12.0)
Neutro Abs: 4.7 10*3/uL (ref 1.4–7.7)
Neutrophils Relative %: 69.1 % (ref 43.0–77.0)
Platelets: 206 10*3/uL (ref 150.0–400.0)
RBC: 5.43 Mil/uL (ref 4.22–5.81)
RDW: 15.6 % — ABNORMAL HIGH (ref 11.5–15.5)
WBC: 6.7 10*3/uL (ref 4.0–10.5)

## 2017-01-08 LAB — TSH: TSH: 0.3 u[IU]/mL — ABNORMAL LOW (ref 0.35–4.50)

## 2017-01-08 LAB — BASIC METABOLIC PANEL
BUN: 8 mg/dL (ref 6–23)
CO2: 31 mEq/L (ref 19–32)
Calcium: 9 mg/dL (ref 8.4–10.5)
Chloride: 104 mEq/L (ref 96–112)
Creatinine, Ser: 0.97 mg/dL (ref 0.40–1.50)
GFR: 84.75 mL/min (ref >60.00)
Glucose, Bld: 105 mg/dL — ABNORMAL HIGH (ref 70–99)
Potassium: 3.7 mEq/L (ref 3.5–5.1)
Sodium: 140 mEq/L (ref 135–145)

## 2017-01-08 LAB — SEDIMENTATION RATE: Sed Rate: 14 mm/hr (ref 0–20)

## 2017-01-08 LAB — BRAIN NATRIURETIC PEPTIDE: Pro B Natriuretic peptide (BNP): 152 pg/mL — ABNORMAL HIGH (ref 0.0–100.0)

## 2017-01-08 NOTE — Patient Instructions (Addendum)
Stop lisinopril indefinitely - you may need a replacement per your cardiologist or PCP   Start back on aspirin first and if no bleeding after 3 days restart your plavix  - stop both if bleeding recurs    Please remember to go to the   x-ray department downstairs in the basement  for your tests - we will call you with the results when they are available.    Please schedule a follow up office visit in 6 weeks with Dr Halford Chessman, call sooner if needed and return with all active  medications /inhalers/ solutions in hand so we can verify exactly what you are taking. This includes all medications from all doctors and over the counters  Add: advised tsh needs repeat with T4/ T3 uptake if returns here before sees pcp

## 2017-01-08 NOTE — Assessment & Plan Note (Signed)
Advised will need f/u as may have sick euthyroid or borderline hyperthyroidism

## 2017-01-08 NOTE — Progress Notes (Signed)
Spoke with pt and notified of results per Dr. Wert. Pt verbalized understanding and denied any questions. 

## 2017-01-08 NOTE — Assessment & Plan Note (Signed)
D/c acei indefinitely as of 12/25/16 (wasn't taking consistently anyway)  With BNP 152 on this date   Consider adding back arb per cards or pcp    No overt chf > f/u as planned s arb for now as he is already very confused with meds

## 2017-01-08 NOTE — Progress Notes (Signed)
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 levaquin 500 mg daily x 7 days NO asa or plavix until no blood x 3 days and then start back on one or the other x 3 days before the second one  Late add:  Ov 2 weeks for recheck and strongly rec he change off acei to arb permanently per provider of acei      01/08/2017  f/u ov/Adajah Cocking re:  ? Pna - not feeling better took last levaquin today / still smoking Chief Complaint  Patient presents with  . Follow-up    Pt is still coughing. States that he is not coughing up any blood but that he is very dizzy and weak.  wearing 02 5lpm/ cpap hs per Dr Sood/ has [facility]ologists at [facility] and here and very easily confused with meds/ instructions Did not resume asa/plavix as per instructions  No obvious day to day or daytime variability or assoc sob or excess/ purulent sputum or mucus plugs or hemoptysis or cp or chest tightness, subjective wheeze or overt sinus or hb symptoms. No unusual exp hx or h/o childhood pna/ asthma or knowledge of premature birth.  Sleeping ok without nocturnal  or early am exacerbation  of respiratory  c/o's or need for noct saba. Also denies any obvious fluctuation of symptoms with weather or environmental changes or other aggravating or alleviating  factors except as outlined above   Current Medications, Allergies, Complete Past Medical History, Past Surgical History, Family History, and Social History were reviewed in [employer] record.  ROS  The following are not active complaints unless bolded sore throat, dysphagia, dental problems, itching, sneezing,  nasal congestion or excess/ purulent secretions, ear ache,   fever, chills, sweats, unintended wt loss, classically pleuritic or exertional cp,  orthopnea pnd or leg swelling, presyncope, palpitations, abdominal pain, anorexia, nausea, vomiting, diarrhea  or change in bowel or bladder habits, change in stools or urine, dysuria,hematuria,  rash, arthralgias, visual complaints, headache, numbness, weakness or ataxia or problems with walking or coordination,  change in mood/affect or memory.               Objective:   Physical Exam    amb wm nad  / looks somewhat depressed   01/08/2017        219  01/01/17 210 lb (95.3 kg)  07/22/16 215 lb (97.5 kg)  07/01/16 219 lb (99.3 kg)    Vital signs reviewed - - Note on arrival 02 sats  98% on RA    HEENT: nl dentition, turbinates bilaterally, and oropharynx. Nl external ear canals without cough reflex  NECK :  without JVD/Nodes/TM/ nl carotid upstrokes bilaterally   LUNGS: no acc muscle use,  Nl contour chest which is clear to A and P bilaterally without cough on insp or exp maneuvers   CV:  RRR  no s3 or murmur or increase in P2, and no edema   ABD:  soft and nontender with nl inspiratory excursion in the supine position. No bruits or organomegaly appreciated, bowel sounds nl  MS:  Nl gait/ ext warm without deformities, calf tenderness, cyanosis or clubbing No obvious joint restrictions   SKIN: warm and dry without lesions    NEURO:  alert,  nl sensorium with  no motor or cerebellar deficits apparent.      CXR PA and Lateral:   01/08/2017 :    I personally reviewed images and agree with radiology  impression as follows:    IMPRESSION: No acute abnormality. Stable changes of COPD and chronic bronchitis. My review area posterobasal infiltrate on prev lateral is much better, no assoc effusion   Labs ordered/ reviewed:      Chemistry      Component Value Date/Time   NA 140 01/08/2017 1444   NA 143 07/01/2016 1459   K 3.7 01/08/2017 1444   CL 104 01/08/2017 1444   CO2 31 01/08/2017 1444   BUN 8 01/08/2017 1444   BUN 8 07/01/2016 1459   CREATININE 0.97 01/08/2017 1444   CREATININE 0.97 12/04/2014 1644      Component Value Date/Time   CALCIUM 9.0 01/08/2017 1444   ALKPHOS 69 07/01/2016 1459   AST 12 07/01/2016 1459   ALT 11 07/01/2016 1459   BILITOT 0.4 07/01/2016 1459        Lab Results  Component Value Date   WBC 6.7 01/08/2017   HGB 17.9 (H) 01/08/2017   HCT 52.6 (H) 01/08/2017   MCV 96.8 01/08/2017   PLT 206.0 01/08/2017      Lab Results  Component Value Date   TSH 0.30 (L) 01/08/2017     Lab Results  Component Value Date   PROBNP 152.0 (H) 01/08/2017       Lab Results  Component Value Date   ESRSEDRATE 14 01/08/2017                Assessment:

## 2017-01-08 NOTE — Assessment & Plan Note (Signed)
Symptoms are markedly disproportionate to objective findings and not clear this is actually much of a  lung problem but pt does appear to have difficult to sort out respiratory symptoms of unknown origin for which  DDX  = almost all start with A and  include Adherence, Ace Inhibitors, Acid Reflux, Active Sinus Disease, Alpha 1 Antitripsin deficiency, Anxiety masquerading as Airways dz,  ABPA,  Allergy(esp in young), Aspiration (esp in elderly), Adverse effects of meds,  Active smokers, A bunch of PE's (a small clot burden can't cause this syndrome unless there is already severe underlying pulm or vascular dz with poor reserve) plus two Bs  = Bronchiectasis and Beta blocker use..and one C= CHF     Adherence is always the initial "prime suspect" and is a multilayered concern that requires a "trust but verify" approach in every patient - starting with knowing how to use medications, especially inhalers, correctly, keeping up with refills and understanding the fundamental difference between maintenance and prns vs those medications only taken for a very short course and then stopped and not refilled.  - getting meds from multiple sources, strongly rec let PCP coordinate care or return here for this purpose to see Tammy NP but return with all meds in hand using a trust but verify approach to confirm accurate Medication  Reconciliation The principal here is that until we are certain that the  patients are doing what we've asked, it makes no sense to ask them to do more.   Active smoking > top of this usual list of suspects  ACEi adverse effects at the  top of the usual list of suspects and the only way to rule it out is a trial off > see a/p    ? Anxiety/depression  > usually at the bottom of this list of usual suspects but should be much higher on this pt's based on H and P and note already on psychotropics .   ? BB effects > Strongly prefer in this setting: Bystolic, the most beta -1  selective Beta blocker  available in sample form, with bisoprolol the most selective generic choice  on the market.   I had an extended discussion with the patient reviewing all relevant studies completed to date and  lasting 25 minutes of a 40  minute acute visit with pt not my regular f/u pt -     re  severe non-specific but potentially very serious refractory respiratory symptoms of uncertain and potentially multiple  etiologies.   Each maintenance medication was reviewed in detail including most importantly the difference between maintenance and prns and under what circumstances the prns are to be triggered using an action plan format that is not reflected in the computer generated alphabetically organized AVS.    Please see AVS for specific instructions unique to this office visit that I personally wrote and verbalized to the the pt in detail and then reviewed with pt  by my nurse highlighting any changes in therapy/plan of care  recommended at today's visit.

## 2017-01-08 NOTE — Assessment & Plan Note (Signed)
PFTS [facility]  11/08/13 wnl including dlco except for mild curvature to f/v loop c/w small airways dz  Not on resp rx at all - key is stop smoking now and if becomes more symptomatic in terms of airways symptoms off acei first step would be change coreg to Bystolic, the most beta -1  selective Beta blocker available in sample form, with bisoprolol the most selective generic choice  on the market.

## 2017-01-08 NOTE — Assessment & Plan Note (Signed)

## 2017-01-08 NOTE — Assessment & Plan Note (Signed)
Onset 12/25/16  - rx doxy thru 6/120 then levaquin 01/01/17 x 7 d @ 500 mg /day > resolved 01/08/2017   Ok to resume asa then plavix  - see avs for instructions unique to this ov

## 2017-01-10 DIAGNOSIS — G4737 Central sleep apnea in conditions classified elsewhere: Secondary | ICD-10-CM | POA: Diagnosis not present

## 2017-01-10 DIAGNOSIS — G4733 Obstructive sleep apnea (adult) (pediatric): Secondary | ICD-10-CM | POA: Diagnosis not present

## 2017-01-12 ENCOUNTER — Ambulatory Visit (INDEPENDENT_AMBULATORY_CARE_PROVIDER_SITE_OTHER): Payer: BLUE CROSS/BLUE SHIELD | Admitting: Physician Assistant

## 2017-01-12 ENCOUNTER — Encounter: Payer: Self-pay | Admitting: Physician Assistant

## 2017-01-12 VITALS — BP 119/80 | HR 77 | Temp 98.3°F | Resp 16 | Ht 70.5 in | Wt 210.0 lb

## 2017-01-12 DIAGNOSIS — T23102A Burn of first degree of left hand, unspecified site, initial encounter: Secondary | ICD-10-CM

## 2017-01-12 DIAGNOSIS — T25199A Burn of first degree of multiple sites of unspecified ankle and foot, initial encounter: Secondary | ICD-10-CM | POA: Diagnosis not present

## 2017-01-12 DIAGNOSIS — T25119A Burn of first degree of unspecified ankle, initial encounter: Secondary | ICD-10-CM

## 2017-01-12 MED ORDER — CEPHALEXIN 500 MG PO CAPS: 500.0000 mg | ORAL_CAPSULE | Freq: Four times a day (QID) | ORAL | 0 refills | Status: DC

## 2017-01-12 MED ORDER — SILVER SULFADIAZINE 1 % EX CREA: 1.0000 "application " | TOPICAL_CREAM | Freq: Every day | CUTANEOUS | 0 refills | Status: DC
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
## 2017-01-14 DIAGNOSIS — F411 Generalized anxiety disorder: Secondary | ICD-10-CM | POA: Diagnosis not present

## 2017-01-16 ENCOUNTER — Other Ambulatory Visit: Payer: Self-pay | Admitting: Physician Assistant

## 2017-01-16 DIAGNOSIS — T25129A Burn of first degree of unspecified foot, initial encounter: Secondary | ICD-10-CM

## 2017-01-16 DIAGNOSIS — T25199A Burn of first degree of multiple sites of unspecified ankle and foot, initial encounter: Secondary | ICD-10-CM

## 2017-01-16 DIAGNOSIS — T23102A Burn of first degree of left hand, unspecified site, initial encounter: Secondary | ICD-10-CM

## 2017-01-18 ENCOUNTER — Ambulatory Visit: Payer: BLUE CROSS/BLUE SHIELD | Admitting: Urgent Care

## 2017-01-21 ENCOUNTER — Encounter: Payer: Self-pay | Admitting: Urgent Care

## 2017-01-21 ENCOUNTER — Ambulatory Visit (INDEPENDENT_AMBULATORY_CARE_PROVIDER_SITE_OTHER): Payer: BLUE CROSS/BLUE SHIELD | Admitting: Urgent Care

## 2017-01-21 VITALS — BP 140/83 | HR 103 | Temp 97.5°F | Resp 18 | Ht 70.5 in | Wt 206.0 lb

## 2017-01-21 DIAGNOSIS — T23102D Burn of first degree of left hand, unspecified site, subsequent encounter: Secondary | ICD-10-CM | POA: Diagnosis not present

## 2017-01-21 DIAGNOSIS — T25129A Burn of first degree of unspecified foot, initial encounter: Secondary | ICD-10-CM

## 2017-01-21 DIAGNOSIS — T25119A Burn of first degree of unspecified ankle, initial encounter: Secondary | ICD-10-CM | POA: Insufficient documentation

## 2017-01-21 DIAGNOSIS — Z5189 Encounter for other specified aftercare: Secondary | ICD-10-CM | POA: Diagnosis not present

## 2017-01-21 DIAGNOSIS — T25199A Burn of first degree of multiple sites of unspecified ankle and foot, initial encounter: Secondary | ICD-10-CM | POA: Diagnosis not present

## 2017-01-21 DIAGNOSIS — T23102A Burn of first degree of left hand, unspecified site, initial encounter: Secondary | ICD-10-CM | POA: Insufficient documentation
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
## 2017-01-21 NOTE — Patient Instructions (Addendum)
We will help you with your future dressing changes once daily for at least a week. We are not open on Sundays so you will change the dressing on your own then. This will be a nursing visit and will require an office visit only if the nurse is concerned that there is worsening symptoms.   Burn Care, Adult A burn is an injury to the skin or the tissues under the skin. There are three types of burns:  First degree. These burns may cause the skin to be red and slightly swollen.  Second degree. These burns are very painful and cause the skin to be very red. The skin may also leak fluid, look shiny, and develop blisters.  Third degree. These burns cause permanent damage. They either turn the skin white or black and make it look charred, dry, and leathery.  Taking care of your burn properly can help to prevent pain and infection. It can also help the burn to heal more quickly. What are the risks? Complications from burns include:  Damage to the skin.  Reduced blood flow near the injury.  Dead tissue.  Scarring.  Problems with movement, if the burn happened near a joint or on the hands or feet.  Severe burns can lead to problems that affect the whole body, such as:  Fluid loss.  Less blood circulating in the body.  Inability to maintain a normal core body temperature (thermoregulation).  Infection.  Shock.  Problems breathing.  How to care for a first-degree burn Right after a burn:  Rinse or soak the burn under cool water until the pain stops. Do not put ice on your burn. This can cause more damage.  Lightly cover the burn with a sterile cloth (dressing). Burn care  Follow instructions from your health care provider about: ? How to clean and take care of the burn. ? When to change and remove the dressing.  Check your burn every day for signs of infection. Check for: ? More redness, swelling, or pain. ? Warmth. ? Pus or a bad smell. Medicine  Take over-the-counter  and prescription medicines only as told by your health care provider.  If you were prescribed antibiotic medicine, take or apply it as told by your health care provider. Do not stop using the antibiotic even if your condition improves. General instructions  To prevent infection, do not put butter, oil, or other home remedies on your burn.  Do not rub your burn, even when you are cleaning it.  Protect your burn from the sun. How to care for a second-degree burn Right after a burn:  Rinse or soak the burn under cool water. Do this for several minutes. Do not put ice on your burn. This can cause more damage.  Lightly cover the burn with a sterile cloth (dressing). Burn care  Raise (elevate) the injured area above the level of your heart while sitting or lying down.  Follow instructions from your health care provider about: ? How to clean and take care of the burn. ? When to change and remove the dressing.  Check your burn every day for signs of infection. Check for: ? More redness, swelling, or pain. ? Warmth. ? Pus or a bad smell. Medicine   Take over-the-counter and prescription medicines only as told by your health care provider.  If you were prescribed antibiotic medicine, take or apply it as told by your health care provider. Do not stop using the antibiotic even if your condition  improves. General instructions  To prevent infection: ? Do not put butter, oil, or other home remedies on the burn. ? Do not scratch or pick at the burn. ? Do not break any blisters. ? Do not peel skin.  Do not rub your burn, even when you are cleaning it.  Protect your burn from the sun. How to care for a third-degree burn Right after a burn:  Lightly cover the burn with gauze.  Seek immediate medical attention. Burn care  Raise (elevate) the injured area above the level of your heart while sitting or lying down.  Drink enough fluid to keep your urine clear or pale yellow.  Rest  as told by your health care provider. Do not participate in sports or other physical activities until your health care provider approves.  Follow instructions from your health care provider about: ? How to clean and take care of the burn. ? When to change and remove the dressing.  Check your burn every day for signs of infection. Check for: ? More redness, swelling, or pain. ? Warmth. ? Pus or a bad smell. Medicine  Take over-the-counter and prescription medicines only as told by your health care provider.  If you were prescribed antibiotic medicine, take or apply it as told by your health care provider. Do not stop using the antibiotic even if your condition improves. General instructions  To prevent infection: ? Do not put butter, oil, or other home remedies on the burn. ? Do not scratch or pick at the burn. ? Do not break any blisters. ? Do not peel skin. ? Do not rub your burn, even when you are cleaning it.  Protect your burn from the sun.  Keep all follow-up visits as told by your health care provider. This is important. Contact a health care provider if:  Your condition does not improve.  Your condition gets worse.  You have a fever.  Your burn changes in appearance or develops black or red spots.  Your burn feels warm to the touch.  Your pain is not controlled with medicine. Get help right away if:  You have redness, swelling, or pain at the site of the burn.  You have fluid, blood, or pus coming from your burn.  You have red streaks near the burn.  You have severe pain. This information is not intended to replace advice given to you by your health care provider. Make sure you discuss any questions you have with your health care provider. Document Released: 06/29/2005 Document Revised: 01/19/2016 Document Reviewed: 12/17/2015 Elsevier Interactive Patient Education  2018 [employer].     IF you received an x-ray today, you will receive an invoice from  [facility]. Please contact [facility] at [phone] with questions or concerns regarding your invoice.   IF you received labwork today, you will receive an invoice from [facility]. Please contact [facility] at [phone] with questions or concerns regarding your invoice.   Our billing staff will not be able to assist you with questions regarding bills from these companies.  You will be contacted with the lab results as soon as they are available. The fastest way to get your results is to activate your My Chart account. Instructions are located on the last page of this paperwork. If you have not heard from [place] regarding the results in 2 weeks, please contact this office.

## 2017-01-22 ENCOUNTER — Ambulatory Visit: Payer: BLUE CROSS/BLUE SHIELD | Admitting: Physician Assistant

## 2017-01-23 ENCOUNTER — Encounter: Payer: Self-pay | Admitting: Physician Assistant

## 2017-01-23 ENCOUNTER — Ambulatory Visit (INDEPENDENT_AMBULATORY_CARE_PROVIDER_SITE_OTHER): Payer: BLUE CROSS/BLUE SHIELD | Admitting: Physician Assistant

## 2017-01-23 VITALS — BP 120/81 | HR 87 | Temp 98.1°F | Resp 16 | Ht 70.0 in | Wt 208.0 lb

## 2017-01-23 DIAGNOSIS — Z5189 Encounter for other specified aftercare: Secondary | ICD-10-CM | POA: Diagnosis not present

## 2017-01-23 NOTE — Patient Instructions (Signed)
     IF you received an x-ray today, you will receive an invoice from [facility]. Please contact [facility] at [phone] with questions or concerns regarding your invoice.   IF you received labwork today, you will receive an invoice from [facility]. Please contact [facility] at [phone] with questions or concerns regarding your invoice.   Our billing staff will not be able to assist you with questions regarding bills from these companies.  You will be contacted with the lab results as soon as they are available. The fastest way to get your results is to activate your My Chart account. Instructions are located on the last page of this paperwork. If you have not heard from us regarding the results in 2 weeks, please contact this office.     

## 2017-01-25 ENCOUNTER — Encounter: Payer: Self-pay | Admitting: Physician Assistant

## 2017-01-25 ENCOUNTER — Ambulatory Visit (INDEPENDENT_AMBULATORY_CARE_PROVIDER_SITE_OTHER): Payer: BLUE CROSS/BLUE SHIELD | Admitting: Physician Assistant

## 2017-01-25 VITALS — BP 96/61 | HR 68 | Temp 98.3°F | Resp 16 | Ht 70.0 in | Wt 208.4 lb

## 2017-01-25 DIAGNOSIS — Z5189 Encounter for other specified aftercare: Secondary | ICD-10-CM

## 2017-01-25 MED ORDER — MUPIROCIN 2 % EX OINT: 1.0000 "application " | TOPICAL_OINTMENT | Freq: Two times a day (BID) | CUTANEOUS | 0 refills | Status: DC

## 2017-01-25 NOTE — Patient Instructions (Signed)
     IF you received an x-ray today, you will receive an invoice from [facility]. Please contact [facility] at [phone] with questions or concerns regarding your invoice.   IF you received labwork today, you will receive an invoice from [facility]. Please contact [facility] at [phone] with questions or concerns regarding your invoice.   Our billing staff will not be able to assist you with questions regarding bills from these companies.  You will be contacted with the lab results as soon as they are available. The fastest way to get your results is to activate your My Chart account. Instructions are located on the last page of this paperwork. If you have not heard from us regarding the results in 2 weeks, please contact this office.     
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
## 2017-01-25 NOTE — Progress Notes (Signed)
Patient wounds dressed by CMA staff per [facility] note.  Patient tolerated dressing changes without complaint. Philis Fendt, MS, PA-C 8:18 AM, 01/25/2017

## 2017-01-26 ENCOUNTER — Telehealth: Payer: Self-pay | Admitting: Neurology

## 2017-01-26 ENCOUNTER — Ambulatory Visit: Payer: BLUE CROSS/BLUE SHIELD | Admitting: Neurology

## 2017-01-26 NOTE — Telephone Encounter (Signed)
This patient did not show for a revisit appointment today.  This is the third no-show since the end of November 2017. The patient will be discharged from our practice.

## 2017-01-27 ENCOUNTER — Encounter: Payer: Self-pay | Admitting: Neurology

## 2017-01-29 ENCOUNTER — Ambulatory Visit: Payer: BLUE CROSS/BLUE SHIELD | Admitting: Family Medicine

## 2017-02-02 ENCOUNTER — Encounter: Payer: Self-pay | Admitting: Neurology

## 2017-02-03 DIAGNOSIS — F4322 Adjustment disorder with anxiety: Secondary | ICD-10-CM | POA: Diagnosis not present

## 2017-02-04 ENCOUNTER — Ambulatory Visit (INDEPENDENT_AMBULATORY_CARE_PROVIDER_SITE_OTHER): Payer: BLUE CROSS/BLUE SHIELD | Admitting: Family Medicine

## 2017-02-04 ENCOUNTER — Encounter: Payer: Self-pay | Admitting: Family Medicine

## 2017-02-04 VITALS — BP 111/73 | HR 86 | Temp 99.1°F | Resp 16 | Ht 70.0 in | Wt 213.4 lb

## 2017-02-04 DIAGNOSIS — T23102D Burn of first degree of left hand, unspecified site, subsequent encounter: Secondary | ICD-10-CM

## 2017-02-04 DIAGNOSIS — R042 Hemoptysis: Secondary | ICD-10-CM | POA: Diagnosis not present

## 2017-02-04 DIAGNOSIS — R946 Abnormal results of thyroid function studies: Secondary | ICD-10-CM

## 2017-02-04 DIAGNOSIS — I5042 Chronic combined systolic (congestive) and diastolic (congestive) heart failure: Secondary | ICD-10-CM

## 2017-02-04 DIAGNOSIS — Z8547 Personal history of malignant neoplasm of testis: Secondary | ICD-10-CM

## 2017-02-04 DIAGNOSIS — D751 Secondary polycythemia: Secondary | ICD-10-CM

## 2017-02-04 DIAGNOSIS — G4731 Primary central sleep apnea: Secondary | ICD-10-CM

## 2017-02-04 DIAGNOSIS — I25118 Atherosclerotic heart disease of native coronary artery with other forms of angina pectoris: Secondary | ICD-10-CM | POA: Diagnosis not present

## 2017-02-04 DIAGNOSIS — T25199A Burn of first degree of multiple sites of unspecified ankle and foot, initial encounter: Secondary | ICD-10-CM

## 2017-02-04 DIAGNOSIS — T25129A Burn of first degree of unspecified foot, initial encounter: Secondary | ICD-10-CM

## 2017-02-04 DIAGNOSIS — R7989 Other specified abnormal findings of blood chemistry: Secondary | ICD-10-CM

## 2017-02-04 MED ORDER — TAMSULOSIN HCL 0.4 MG PO CAPS: 0.4000 mg | ORAL_CAPSULE | Freq: Every day | ORAL | 0 refills | Status: DC
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
## 2017-02-04 NOTE — Patient Instructions (Addendum)
Apply bactroban to burn every morning; remove bandage at bedtime every night; wash foot with soap and water at bedtime every day.    IF you received an x-ray today, you will receive an invoice from [facility]. Please contact [facility] at [phone] with questions or concerns regarding your invoice.   IF you received labwork today, you will receive an invoice from [facility]. Please contact [facility] at [phone] with questions or concerns regarding your invoice.   Our billing staff will not be able to assist you with questions regarding bills from these companies.  You will be contacted with the lab results as soon as they are available. The fastest way to get your results is to activate your My Chart account. Instructions are located on the last page of this paperwork. If you have not heard from [place] regarding the results in 2 weeks, please contact this office.    thyroi

## 2017-02-05 LAB — COMPREHENSIVE METABOLIC PANEL
ALT: 20 IU/L (ref 0–44)
AST: 13 IU/L (ref 0–40)
Albumin/Globulin Ratio: 1.6 (ref 1.2–2.2)
Albumin: 4.4 g/dL (ref 3.5–5.5)
Alkaline Phosphatase: 96 IU/L (ref 39–117)
BUN/Creatinine Ratio: 12 (ref 9–20)
BUN: 14 mg/dL (ref 6–24)
Bilirubin Total: 0.2 mg/dL (ref 0.0–1.2)
CO2: 27 mmol/L (ref 20–29)
Calcium: 9.4 mg/dL (ref 8.7–10.2)
Chloride: 96 mmol/L (ref 96–106)
Creatinine, Ser: 1.13 mg/dL (ref 0.76–1.27)
GFR calc Af Amer: 83 mL/min/{1.73_m2} (ref >59)
GFR calc non Af Amer: 72 mL/min/{1.73_m2} (ref >59)
Globulin, Total: 2.8 g/dL (ref 1.5–4.5)
Glucose: 100 mg/dL — ABNORMAL HIGH (ref 65–99)
Potassium: 4.7 mmol/L (ref 3.5–5.2)
Sodium: 137 mmol/L (ref 134–144)
Total Protein: 7.2 g/dL (ref 6.0–8.5)

## 2017-02-05 LAB — CBC WITH DIFFERENTIAL/PLATELET
Basophils Absolute: 0 10*3/uL (ref 0.0–0.2)
Basos: 0 %
EOS (ABSOLUTE): 0.4 10*3/uL (ref 0.0–0.4)
Eos: 5 %
Hematocrit: 51.4 % — ABNORMAL HIGH (ref 37.5–51.0)
Hemoglobin: 17.3 g/dL (ref 13.0–17.7)
Immature Grans (Abs): 0 10*3/uL (ref 0.0–0.1)
Immature Granulocytes: 1 %
Lymphocytes Absolute: 2 10*3/uL (ref 0.7–3.1)
Lymphs: 22 %
MCH: 32.5 pg (ref 26.6–33.0)
MCHC: 33.7 g/dL (ref 31.5–35.7)
MCV: 97 fL (ref 79–97)
Monocytes Absolute: 0.6 10*3/uL (ref 0.1–0.9)
Monocytes: 6 %
Neutrophils Absolute: 5.8 10*3/uL (ref 1.4–7.0)
Neutrophils: 66 %
Platelets: 165 10*3/uL (ref 150–379)
RBC: 5.32 x10E6/uL (ref 4.14–5.80)
RDW: 14.1 % (ref 12.3–15.4)
WBC: 8.8 10*3/uL (ref 3.4–10.8)

## 2017-02-05 LAB — TSH: TSH: 0.832 u[IU]/mL (ref 0.450–4.500)

## 2017-02-05 LAB — T4, FREE: Free T4: 0.83 ng/dL (ref 0.82–1.77)

## 2017-02-10 DIAGNOSIS — G4737 Central sleep apnea in conditions classified elsewhere: Secondary | ICD-10-CM | POA: Diagnosis not present

## 2017-02-10 DIAGNOSIS — G4733 Obstructive sleep apnea (adult) (pediatric): Secondary | ICD-10-CM | POA: Diagnosis not present

## 2017-02-23 DIAGNOSIS — G4733 Obstructive sleep apnea (adult) (pediatric): Secondary | ICD-10-CM | POA: Diagnosis not present

## 2017-02-24 ENCOUNTER — Other Ambulatory Visit (HOSPITAL_COMMUNITY): Payer: Self-pay | Admitting: Internal Medicine

## 2017-03-09 ENCOUNTER — Telehealth (HOSPITAL_COMMUNITY): Payer: Self-pay | Admitting: *Deleted

## 2017-03-09 NOTE — Telephone Encounter (Signed)
Patient called and left message on triage line stating he needed to be seen, he was feeling really weak.  I tried calling patient back but had to leave VM asking for him to call us back.

## 2017-03-12 DIAGNOSIS — F411 Generalized anxiety disorder: Secondary | ICD-10-CM | POA: Diagnosis not present

## 2017-03-13 DIAGNOSIS — G4737 Central sleep apnea in conditions classified elsewhere: Secondary | ICD-10-CM | POA: Diagnosis not present

## 2017-03-13 DIAGNOSIS — G4733 Obstructive sleep apnea (adult) (pediatric): Secondary | ICD-10-CM | POA: Diagnosis not present

## 2017-03-17 DIAGNOSIS — F411 Generalized anxiety disorder: Secondary | ICD-10-CM | POA: Diagnosis not present

## 2017-04-12 DIAGNOSIS — G4737 Central sleep apnea in conditions classified elsewhere: Secondary | ICD-10-CM | POA: Diagnosis not present

## 2017-04-12 DIAGNOSIS — G4733 Obstructive sleep apnea (adult) (pediatric): Secondary | ICD-10-CM | POA: Diagnosis not present

## 2017-04-27 ENCOUNTER — Encounter (HOSPITAL_COMMUNITY): Payer: Self-pay | Admitting: Internal Medicine

## 2017-04-28 ENCOUNTER — Ambulatory Visit (HOSPITAL_COMMUNITY)
Admission: RE | Admit: 2017-04-28 | Discharge: 2017-04-28 | Disposition: A | Discharge status: Home / Self Care | Payer: BLUE CROSS/BLUE SHIELD | Source: Ambulatory Visit | Attending: Internal Medicine | Admitting: Internal Medicine

## 2017-04-28 ENCOUNTER — Other Ambulatory Visit (HOSPITAL_COMMUNITY): Payer: Self-pay | Admitting: *Deleted

## 2017-04-28 ENCOUNTER — Encounter (HOSPITAL_COMMUNITY): Payer: Self-pay | Admitting: *Deleted

## 2017-04-28 ENCOUNTER — Telehealth (HOSPITAL_COMMUNITY): Payer: Self-pay | Admitting: Internal Medicine

## 2017-04-28 VITALS — BP 124/76 | HR 83 | Wt 217.5 lb

## 2017-04-28 DIAGNOSIS — I209 Angina pectoris, unspecified: Secondary | ICD-10-CM

## 2017-04-28 DIAGNOSIS — I2510 Atherosclerotic heart disease of native coronary artery without angina pectoris: Secondary | ICD-10-CM

## 2017-04-28 DIAGNOSIS — F329 Major depressive disorder, single episode, unspecified: Secondary | ICD-10-CM | POA: Diagnosis not present

## 2017-04-28 DIAGNOSIS — F419 Anxiety disorder, unspecified: Secondary | ICD-10-CM | POA: Diagnosis not present

## 2017-04-28 DIAGNOSIS — G4733 Obstructive sleep apnea (adult) (pediatric): Secondary | ICD-10-CM | POA: Diagnosis not present

## 2017-04-28 DIAGNOSIS — D751 Secondary polycythemia: Secondary | ICD-10-CM | POA: Diagnosis not present

## 2017-04-28 DIAGNOSIS — M79606 Pain in leg, unspecified: Secondary | ICD-10-CM | POA: Diagnosis not present

## 2017-04-28 DIAGNOSIS — I5022 Chronic systolic (congestive) heart failure: Secondary | ICD-10-CM | POA: Insufficient documentation

## 2017-04-28 DIAGNOSIS — G8929 Other chronic pain: Secondary | ICD-10-CM | POA: Insufficient documentation

## 2017-04-28 DIAGNOSIS — I5042 Chronic combined systolic (congestive) and diastolic (congestive) heart failure: Secondary | ICD-10-CM

## 2017-04-28 DIAGNOSIS — E669 Obesity, unspecified: Secondary | ICD-10-CM | POA: Diagnosis not present

## 2017-04-28 DIAGNOSIS — M549 Dorsalgia, unspecified: Secondary | ICD-10-CM | POA: Insufficient documentation

## 2017-04-28 DIAGNOSIS — Z95810 Presence of automatic (implantable) cardiac defibrillator: Secondary | ICD-10-CM | POA: Diagnosis not present

## 2017-04-28 DIAGNOSIS — I255 Ischemic cardiomyopathy: Secondary | ICD-10-CM | POA: Insufficient documentation

## 2017-04-28 DIAGNOSIS — Z7902 Long term (current) use of antithrombotics/antiplatelets: Secondary | ICD-10-CM | POA: Insufficient documentation

## 2017-04-28 DIAGNOSIS — Z7982 Long term (current) use of aspirin: Secondary | ICD-10-CM | POA: Diagnosis not present

## 2017-04-28 DIAGNOSIS — J9610 Chronic respiratory failure, unspecified whether with hypoxia or hypercapnia: Secondary | ICD-10-CM | POA: Diagnosis not present

## 2017-04-28 DIAGNOSIS — Z79899 Other long term (current) drug therapy: Secondary | ICD-10-CM | POA: Diagnosis not present

## 2017-04-28 LAB — CBC
HCT: 52 % (ref 39.0–52.0)
Hemoglobin: 18.1 g/dL — ABNORMAL HIGH (ref 13.0–17.0)
MCH: 32.8 pg (ref 26.0–34.0)
MCHC: 34.8 g/dL (ref 30.0–36.0)
MCV: 94.2 fL (ref 78.0–100.0)
Platelets: 174 10*3/uL (ref 150–400)
RBC: 5.52 MIL/uL (ref 4.22–5.81)
RDW: 14.7 % (ref 11.5–15.5)
WBC: 9.2 10*3/uL (ref 4.0–10.5)

## 2017-04-28 LAB — BASIC METABOLIC PANEL
Anion gap: 9 (ref 5–15)
BUN: 7 mg/dL (ref 6–20)
CO2: 25 mmol/L (ref 22–32)
Calcium: 9.3 mg/dL (ref 8.9–10.3)
Chloride: 100 mmol/L — ABNORMAL LOW (ref 101–111)
Creatinine, Ser: 0.97 mg/dL (ref 0.61–1.24)
GFR calc Af Amer: >60 mL/min (ref >60)
GFR calc non Af Amer: >60 mL/min (ref >60)
Glucose, Bld: 121 mg/dL — ABNORMAL HIGH (ref 65–99)
Potassium: 3.9 mmol/L (ref 3.5–5.1)
Sodium: 134 mmol/L — ABNORMAL LOW (ref 135–145)

## 2017-04-28 LAB — PROTIME-INR
INR: 1.04
Prothrombin Time: 13.5 seconds (ref 11.4–15.2)

## 2017-04-28 MED ORDER — LISINOPRIL 5 MG PO TABS: 5.0000 mg | ORAL_TABLET | Freq: Every day | ORAL | 3 refills | Status: DC
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
## 2017-04-28 NOTE — Patient Instructions (Signed)
Labs today (will call for abnormal results, otherwise no news is good news)  INCREASE lisinopril to 5 mg (1 Tablet) Once Daily  You have been referred to Cardiac Rehab, they will contact you to schedule your initial appointment.  Echocardiogram has been ordered for you, we will schedule at checkout.  You have been scheduled for a Right & Left Heart Cath next Tuesday October 23rd, please see attached instructions.   Follow up in 6 months, please call us to schedule your follow up appointment.

## 2017-04-29 ENCOUNTER — Telehealth (HOSPITAL_COMMUNITY): Payer: Self-pay

## 2017-04-29 NOTE — Telephone Encounter (Signed)
Patient insurance is active and benefits verified. Patient insurance is BCBS - no co-payment, deductible $2500/$2500 has been met, out of pocket $7350/$3840.85 has been met, 10% co-insurance and no pre-authorization. Passport/reference [phone].  Referral is currently on hold until further notice. Patient having a R/L cardiac cath on 05/04/17.

## 2017-04-30 NOTE — Telephone Encounter (Signed)
User: Cherie Dark A Date/time: 04/30/17 11:44 AM  Comment: Called pt and was unable to lmsg due to the VM being full.  Context:  Outcome: No Answer/Busy  Phone number: [phone] Phone Type: Home Phone  Comm. type: Telephone Call type: Outgoing  Contact: Louanne Belton D Relation to patient: Self    User: Cherie Dark A Date/time: 04/29/17 2:11 PM  Comment: Called pt and was unable to lmsg due to VM being full  Context:  Outcome: No Answer/Busy  Phone number: [phone] Phone Type: Home Phone  Comm. type: Telephone Call type: Outgoing  Contact: Louanne Belton D Relation to patient: Self    User: Cherie Dark A Date/time: 04/28/17 3:28 PM  Comment: Called pt and was unable to lmsg due to VM being full..RG  Context:  Outcome: No Answer/Busy  Phone number: [phone] Phone Type: Home Phone  Comm. type: Telephone Call type: Outgoing  Contact: Louanne Belton D Relation to patient: Self

## 2017-05-04 ENCOUNTER — Encounter (HOSPITAL_COMMUNITY): Admission: RE | Payer: Self-pay | Source: Ambulatory Visit

## 2017-05-04 ENCOUNTER — Ambulatory Visit (HOSPITAL_COMMUNITY)
Admission: RE | Admit: 2017-05-04 | Payer: BLUE CROSS/BLUE SHIELD | Source: Ambulatory Visit | Admitting: Internal Medicine

## 2017-05-04 SURGERY — RIGHT/LEFT HEART CATH AND CORONARY ANGIOGRAPHY
Anesthesia: LOCAL

## 2017-05-06 ENCOUNTER — Other Ambulatory Visit (HOSPITAL_COMMUNITY): Payer: Self-pay | Admitting: Internal Medicine

## 2017-05-13 DIAGNOSIS — G4737 Central sleep apnea in conditions classified elsewhere: Secondary | ICD-10-CM | POA: Diagnosis not present

## 2017-05-13 DIAGNOSIS — G4733 Obstructive sleep apnea (adult) (pediatric): Secondary | ICD-10-CM | POA: Diagnosis not present

## 2017-05-18 DIAGNOSIS — F411 Generalized anxiety disorder: Secondary | ICD-10-CM | POA: Diagnosis not present

## 2017-05-20 DIAGNOSIS — N5201 Erectile dysfunction due to arterial insufficiency: Secondary | ICD-10-CM | POA: Diagnosis not present

## 2017-05-26 DIAGNOSIS — G4733 Obstructive sleep apnea (adult) (pediatric): Secondary | ICD-10-CM | POA: Diagnosis not present

## 2017-06-09 ENCOUNTER — Ambulatory Visit: Payer: BLUE CROSS/BLUE SHIELD | Admitting: Family Medicine

## 2017-06-12 DIAGNOSIS — G4737 Central sleep apnea in conditions classified elsewhere: Secondary | ICD-10-CM | POA: Diagnosis not present

## 2017-06-12 DIAGNOSIS — G4733 Obstructive sleep apnea (adult) (pediatric): Secondary | ICD-10-CM | POA: Diagnosis not present

## 2017-06-14 ENCOUNTER — Ambulatory Visit: Payer: BLUE CROSS/BLUE SHIELD

## 2017-06-14 ENCOUNTER — Other Ambulatory Visit: Payer: Self-pay

## 2017-06-14 ENCOUNTER — Encounter: Payer: Self-pay | Admitting: Family Medicine

## 2017-06-14 ENCOUNTER — Ambulatory Visit: Payer: BLUE CROSS/BLUE SHIELD | Admitting: Family Medicine

## 2017-06-14 VITALS — BP 130/80 | HR 78 | Temp 98.0°F | Resp 16 | Ht 71.65 in | Wt 209.0 lb

## 2017-06-14 DIAGNOSIS — Z131 Encounter for screening for diabetes mellitus: Secondary | ICD-10-CM | POA: Diagnosis not present

## 2017-06-14 DIAGNOSIS — D751 Secondary polycythemia: Secondary | ICD-10-CM

## 2017-06-14 DIAGNOSIS — M25551 Pain in right hip: Secondary | ICD-10-CM

## 2017-06-14 DIAGNOSIS — E7800 Pure hypercholesterolemia, unspecified: Secondary | ICD-10-CM | POA: Diagnosis not present

## 2017-06-14 DIAGNOSIS — R0902 Hypoxemia: Secondary | ICD-10-CM

## 2017-06-14 DIAGNOSIS — Z7251 High risk heterosexual behavior: Secondary | ICD-10-CM | POA: Diagnosis not present

## 2017-06-14 DIAGNOSIS — I2510 Atherosclerotic heart disease of native coronary artery without angina pectoris: Secondary | ICD-10-CM

## 2017-06-14 DIAGNOSIS — G4731 Primary central sleep apnea: Secondary | ICD-10-CM | POA: Diagnosis not present

## 2017-06-14 DIAGNOSIS — Z23 Encounter for immunization: Secondary | ICD-10-CM

## 2017-06-14 NOTE — Patient Instructions (Signed)
     IF you received an x-ray today, you will receive an invoice from [facility]. Please contact [facility] at [phone] with questions or concerns regarding your invoice.   IF you received labwork today, you will receive an invoice from [facility]. Please contact [facility] at [phone] with questions or concerns regarding your invoice.   Our billing staff will not be able to assist you with questions regarding bills from these companies.  You will be contacted with the lab results as soon as they are available. The fastest way to get your results is to activate your My Chart account. Instructions are located on the last page of this paperwork. If you have not heard from us regarding the results in 2 weeks, please contact this office.     
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
## 2017-06-15 LAB — CBC WITH DIFFERENTIAL/PLATELET
Basophils Absolute: 0 10*3/uL (ref 0.0–0.2)
Basos: 0 %
EOS (ABSOLUTE): 0.1 10*3/uL (ref 0.0–0.4)
Eos: 2 %
Hematocrit: 53.4 % — ABNORMAL HIGH (ref 37.5–51.0)
Hemoglobin: 18 g/dL — ABNORMAL HIGH (ref 13.0–17.7)
Immature Grans (Abs): 0 10*3/uL (ref 0.0–0.1)
Immature Granulocytes: 0 %
Lymphocytes Absolute: 0.7 10*3/uL (ref 0.7–3.1)
Lymphs: 12 %
MCH: 33.6 pg — ABNORMAL HIGH (ref 26.6–33.0)
MCHC: 33.7 g/dL (ref 31.5–35.7)
MCV: 100 fL — ABNORMAL HIGH (ref 79–97)
Monocytes Absolute: 0.5 10*3/uL (ref 0.1–0.9)
Monocytes: 8 %
Neutrophils Absolute: 4.6 10*3/uL (ref 1.4–7.0)
Neutrophils: 78 %
Platelets: 170 10*3/uL (ref 150–379)
RBC: 5.36 x10E6/uL (ref 4.14–5.80)
RDW: 15.1 % (ref 12.3–15.4)
WBC: 5.8 10*3/uL (ref 3.4–10.8)

## 2017-06-15 LAB — COMPREHENSIVE METABOLIC PANEL
ALT: 9 IU/L (ref 0–44)
AST: 14 IU/L (ref 0–40)
Albumin/Globulin Ratio: 1.9 (ref 1.2–2.2)
Albumin: 4.6 g/dL (ref 3.5–5.5)
Alkaline Phosphatase: 72 IU/L (ref 39–117)
BUN/Creatinine Ratio: 13 (ref 9–20)
BUN: 13 mg/dL (ref 6–24)
Bilirubin Total: 0.8 mg/dL (ref 0.0–1.2)
CO2: 26 mmol/L (ref 20–29)
Calcium: 9 mg/dL (ref 8.7–10.2)
Chloride: 99 mmol/L (ref 96–106)
Creatinine, Ser: 1.03 mg/dL (ref 0.76–1.27)
GFR calc Af Amer: 93 mL/min/{1.73_m2} (ref >59)
GFR calc non Af Amer: 80 mL/min/{1.73_m2} (ref >59)
Globulin, Total: 2.4 g/dL (ref 1.5–4.5)
Glucose: 133 mg/dL — ABNORMAL HIGH (ref 65–99)
Potassium: 4 mmol/L (ref 3.5–5.2)
Sodium: 143 mmol/L (ref 134–144)
Total Protein: 7 g/dL (ref 6.0–8.5)

## 2017-06-15 LAB — ACUTE HEP PANEL AND HEP B SURFACE AB
Hep A IgM: NEGATIVE
Hep B C IgM: NEGATIVE
Hep C Virus Ab: <0.1 s/co ratio (ref 0.0–0.9)
Hepatitis B Surf Ab Quant: 3.2 m[IU]/mL — ABNORMAL LOW (ref >9.9)
Hepatitis B Surface Ag: NEGATIVE

## 2017-06-15 LAB — HEMOGLOBIN A1C
Est. average glucose Bld gHb Est-mCnc: 105 mg/dL
Hgb A1c MFr Bld: 5.3 % (ref 4.8–5.6)

## 2017-06-15 LAB — LIPID PANEL
Chol/HDL Ratio: 4.5 ratio (ref 0.0–5.0)
Cholesterol, Total: 187 mg/dL (ref 100–199)
HDL: 42 mg/dL (ref >39)
LDL Calculated: 124 mg/dL — ABNORMAL HIGH (ref 0–99)
Triglycerides: 104 mg/dL (ref 0–149)
VLDL Cholesterol Cal: 21 mg/dL (ref 5–40)

## 2017-06-15 LAB — HIV ANTIBODY (ROUTINE TESTING W REFLEX): HIV Screen 4th Generation wRfx: NONREACTIVE

## 2017-06-15 LAB — GC/CHLAMYDIA PROBE AMP
Chlamydia trachomatis, NAA: NEGATIVE
Neisseria gonorrhoeae by PCR: NEGATIVE

## 2017-06-15 LAB — RPR: RPR Ser Ql: NONREACTIVE

## 2017-06-25 ENCOUNTER — Telehealth: Payer: Self-pay | Admitting: Internal Medicine

## 2017-06-25 NOTE — Telephone Encounter (Signed)
Tried calling patient to give information on upcoming appointment but was unable to leave message due to mailbox being full.

## 2017-06-28 DIAGNOSIS — F4322 Adjustment disorder with anxiety: Secondary | ICD-10-CM | POA: Diagnosis not present

## 2017-07-03 ENCOUNTER — Telehealth: Payer: Self-pay | Admitting: Family Medicine

## 2017-07-03 ENCOUNTER — Encounter (HOSPITAL_COMMUNITY): Payer: Self-pay | Admitting: Emergency Medicine

## 2017-07-03 ENCOUNTER — Emergency Department (HOSPITAL_COMMUNITY)
Admission: EM | Admit: 2017-07-03 | Discharge: 2017-07-03 | Disposition: A | Discharge status: Home / Self Care | Payer: BLUE CROSS/BLUE SHIELD | Attending: Emergency Medicine | Admitting: Emergency Medicine

## 2017-07-03 DIAGNOSIS — K047 Periapical abscess without sinus: Secondary | ICD-10-CM | POA: Diagnosis not present

## 2017-07-03 DIAGNOSIS — I5022 Chronic systolic (congestive) heart failure: Secondary | ICD-10-CM | POA: Diagnosis not present

## 2017-07-03 DIAGNOSIS — Z95810 Presence of automatic (implantable) cardiac defibrillator: Secondary | ICD-10-CM | POA: Insufficient documentation

## 2017-07-03 DIAGNOSIS — J449 Chronic obstructive pulmonary disease, unspecified: Secondary | ICD-10-CM | POA: Insufficient documentation

## 2017-07-03 DIAGNOSIS — I2510 Atherosclerotic heart disease of native coronary artery without angina pectoris: Secondary | ICD-10-CM | POA: Insufficient documentation

## 2017-07-03 DIAGNOSIS — K0889 Other specified disorders of teeth and supporting structures: Secondary | ICD-10-CM | POA: Diagnosis not present

## 2017-07-03 DIAGNOSIS — Z7982 Long term (current) use of aspirin: Secondary | ICD-10-CM | POA: Diagnosis not present

## 2017-07-03 DIAGNOSIS — Z87891 Personal history of nicotine dependence: Secondary | ICD-10-CM | POA: Diagnosis not present

## 2017-07-03 DIAGNOSIS — Z7901 Long term (current) use of anticoagulants: Secondary | ICD-10-CM | POA: Diagnosis not present

## 2017-07-03 DIAGNOSIS — Z79899 Other long term (current) drug therapy: Secondary | ICD-10-CM | POA: Insufficient documentation

## 2017-07-03 MED ORDER — NAPROXEN 500 MG PO TABS: 500.0000 mg | ORAL_TABLET | Freq: Two times a day (BID) | ORAL | 0 refills | Status: DC

## 2017-07-03 MED ORDER — CLINDAMYCIN HCL 300 MG PO CAPS
450.0000 mg | ORAL_CAPSULE | Freq: Once | ORAL | Status: AC
  Administered 2017-07-03: 450 mg via ORAL
  Filled 2017-07-03: qty 1

## 2017-07-03 MED ORDER — CLINDAMYCIN HCL 150 MG PO CAPS: 450.0000 mg | ORAL_CAPSULE | Freq: Three times a day (TID) | ORAL | 0 refills | Status: AC

## 2017-07-03 NOTE — Discharge Instructions (Signed)
Please read attached information regarding your condition. Take Clindamycin three times daily for 7 days. Please complete the entire course of this medication for the appropriate time regardless of symptom improvement. Take naproxen as needed for pain. Follow-up with your dentist for further evaluation. Return to ED for worsening swelling, pain, drainage from site, trouble breathing, trouble swallowing.

## 2017-07-03 NOTE — ED Triage Notes (Signed)
Patient c/o right upper dental pain x3 days. Reports swelling into cheek since yesterday. States he had leftover amoxicillin and has taken three 1000mg  doses without relief. Speaking in full sentences without difficulty.

## 2017-07-03 NOTE — Telephone Encounter (Signed)
Patient called the answering service--the triage nurse spoke to him and then called the clinic--I told her we did not have anything open and if his tooth abscess was as bad as he stated it was he was more than welcome to go to the Urgent Care or the ER. She stated she would inform the patient.  The patient then came into the clinic wanting to see if we could see him, I informed that we did not have any openings as I had instructed the answering service he stated that he understood that but that if I would go ask Dr Tamala Julian she would work him in because it was an emergency. I explained that it was not our protocol to ask the provider and that we could not see him. I suggested that he go to the Urgent Care by [facility] or the ER. I also made him an apt for September 23, 2022 12/24 to get his tooth looked at. He stated that by 09-23-22 he would be brain dead and that it would be my fault. I told him that there was no reason to make a comment like that towards me and that he was more than welcome to go to the ER if he was that concerned about it and wanted to be seen sooner than September 23, 2022. Patient took the apt card and left.
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
## 2017-07-05 ENCOUNTER — Ambulatory Visit: Payer: BLUE CROSS/BLUE SHIELD | Admitting: Family Medicine

## 2017-07-07 DIAGNOSIS — K047 Periapical abscess without sinus: Secondary | ICD-10-CM | POA: Diagnosis not present

## 2017-07-08 ENCOUNTER — Encounter: Payer: Self-pay | Admitting: Internal Medicine

## 2017-07-13 DIAGNOSIS — G4733 Obstructive sleep apnea (adult) (pediatric): Secondary | ICD-10-CM | POA: Diagnosis not present

## 2017-07-13 DIAGNOSIS — G4737 Central sleep apnea in conditions classified elsewhere: Secondary | ICD-10-CM | POA: Diagnosis not present

## 2017-07-15 ENCOUNTER — Telehealth: Payer: Self-pay | Admitting: Pulmonary Disease

## 2017-07-15 DIAGNOSIS — G4733 Obstructive sleep apnea (adult) (pediatric): Secondary | ICD-10-CM

## 2017-07-15 DIAGNOSIS — Z9989 Dependence on other enabling machines and devices: Principal | ICD-10-CM

## 2017-07-15 NOTE — Telephone Encounter (Signed)
Spoke with patient regarding cpap machine. Pt states that cpap machine is 58 years old, has broken motor. He made [facility] aware of these concerns, they advised to call our office for new script for new cpap machine.   Pt advised that cpap machine is not working properly for last 5 days. [facility] doesn't have a cpap machine to provide pt as of today.  Pt reports having sweats, chills, fever, currently has 98 year old son having fever and flu like symptoms. Pt is not breathing well at night last 5 days, stopped breathing several times during the night had to be shoved to be woken up. Pt is on 5 liters of O2 with cpap machine, currently on O2 only at night for last 5 days.  Spoke with MW, he advised pt go to ED today. Pt refused to go to ED.   Will keep message open to follow up on 07/16/17

## 2017-07-16 ENCOUNTER — Telehealth: Payer: Self-pay | Admitting: Pulmonary Disease

## 2017-07-16 NOTE — Telephone Encounter (Signed)
Spoke with patient regarding cpap machine not working for last 6 days Spoke with Administrator, [employer] of [employer] in regarding to cpap machine for pt Per Magda Paganini, pt is past the warranty of 2 years on cpap machine, and needs new cpap machine, mask of choice, and supplies with humidifier. Patient is on 5 liters of O2, bleeds in with cpap machine.  Pt reports not breathing well, not sleeping well in last 6 nights. Placed new order for cpap machine today to Concha Se at [facility] at [phone], phone [phone] Per Magda Paganini the cpap machine can be delivered to patient today  Spoke with patient after speaking with Magda Paganini with [facility] Pt reports sweats, chills, and not breathing well would like appt with VS next week Scheduled appt for pt on Monday 07/19/17 at 2:30pm to be seen by VS Pt verbalized understanding, no further concerns or questions Nothing further needed.

## 2017-07-16 NOTE — Telephone Encounter (Signed)
Left message with Kenney Houseman with [facility] at [phone]

## 2017-07-16 NOTE — Telephone Encounter (Signed)
Spoke with Kenney Houseman with [facility] Patient was sent a cpap machine new order to [facility] Pt is in need of BIPAP machine; resent order to [place] today to Marcell Barlow advised that the bipap machine was denied by insurance of [employer] [phone] today, left message for call back regarding appeal

## 2017-07-16 NOTE — Telephone Encounter (Signed)
Tonya with [facility] returning call, transferred to [facility].

## 2017-07-19 ENCOUNTER — Encounter: Payer: Self-pay | Admitting: Pulmonary Disease

## 2017-07-19 ENCOUNTER — Ambulatory Visit: Payer: BLUE CROSS/BLUE SHIELD | Admitting: Pulmonary Disease

## 2017-07-19 ENCOUNTER — Telehealth: Payer: Self-pay | Admitting: Pulmonary Disease

## 2017-07-19 VITALS — BP 118/78 | HR 81 | Ht 72.0 in | Wt 201.4 lb

## 2017-07-19 DIAGNOSIS — Z720 Tobacco use: Secondary | ICD-10-CM

## 2017-07-19 DIAGNOSIS — D751 Secondary polycythemia: Secondary | ICD-10-CM

## 2017-07-19 DIAGNOSIS — J9611 Chronic respiratory failure with hypoxia: Secondary | ICD-10-CM

## 2017-07-19 DIAGNOSIS — G4731 Primary central sleep apnea: Secondary | ICD-10-CM | POA: Diagnosis not present

## 2017-07-19 NOTE — Patient Instructions (Signed)
Will arrange for overnight oxygen test and get report from new Bipap machine  Follow up in 6 months
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
## 2017-07-19 NOTE — Telephone Encounter (Signed)
Called and spoke with pt.  Pt states he forgot to mention having PNA shot at today's visit with VS.  I have copied below last PNA shot.   VS please advise if okay for pt to have PNA shot.   Pneumococcal Conjugate-13 06/11/2014         Pneumococcal Polysaccharide-23 08/10/2013

## 2017-07-19 NOTE — Telephone Encounter (Signed)
He doesn't need any additional pneumonia shots at this time.  He needs a Pneumonia 23 shot in 2020.

## 2017-07-20 ENCOUNTER — Telehealth: Payer: Self-pay | Admitting: Pulmonary Disease

## 2017-07-20 ENCOUNTER — Encounter: Payer: Self-pay | Admitting: Internal Medicine

## 2017-07-20 NOTE — Telephone Encounter (Signed)
Recd letter from [place] on 07-19-2017 regarding the request has been reviewed for authorization of Bipap machine with back up rate feature at [facility] for patient. The letter states this request has not been approved because the clinical indication doesn't support the requested service. The clinical rationale used in making this adverse determination is that it is not medically necessary for patient at this time.   Placed fax for appeal for patient's bipap machine in green to do folder for VS to review.

## 2017-07-20 NOTE — Telephone Encounter (Signed)
ATC pt- unable to leave vm due to mailbox being full.  Will call back

## 2017-07-21 ENCOUNTER — Telehealth: Payer: Self-pay | Admitting: Pulmonary Disease

## 2017-07-21 DIAGNOSIS — G4739 Other sleep apnea: Secondary | ICD-10-CM

## 2017-07-21 DIAGNOSIS — J9611 Chronic respiratory failure with hypoxia: Secondary | ICD-10-CM

## 2017-07-21 DIAGNOSIS — G4731 Primary central sleep apnea: Secondary | ICD-10-CM

## 2017-07-21 DIAGNOSIS — G4737 Central sleep apnea in conditions classified elsewhere: Secondary | ICD-10-CM

## 2017-07-21 NOTE — Telephone Encounter (Signed)
Spoke with [employer]. VS has the green folder and is working on it.   Will forward to [facility] for follow up.

## 2017-07-21 NOTE — Telephone Encounter (Signed)
Called and spoke to [place] with [employer].  Enid Derry transferred me to appeals line, where a message was left for appeal's nurse to return our call.  Will await call back.

## 2017-07-21 NOTE — Telephone Encounter (Signed)
Please see 07/19/17 phone.  It appears that nothing further is needed from [place] at this time, as we are awaiting letter to be written by VS.

## 2017-07-21 NOTE — Telephone Encounter (Signed)
Spoke with pt. He is aware of VS response. Nothing further was needed.

## 2017-07-22 ENCOUNTER — Inpatient Hospital Stay: Payer: BLUE CROSS/BLUE SHIELD | Attending: Internal Medicine | Admitting: Hematology and Oncology

## 2017-07-22 ENCOUNTER — Inpatient Hospital Stay: Payer: BLUE CROSS/BLUE SHIELD

## 2017-07-22 ENCOUNTER — Other Ambulatory Visit (HOSPITAL_COMMUNITY): Payer: Self-pay | Admitting: Internal Medicine

## 2017-07-22 ENCOUNTER — Telehealth: Payer: Self-pay | Admitting: Hematology and Oncology

## 2017-07-22 VITALS — BP 128/83 | HR 82 | Temp 98.7°F | Ht 72.0 in | Wt 196.0 lb

## 2017-07-22 DIAGNOSIS — D751 Secondary polycythemia: Secondary | ICD-10-CM

## 2017-07-22 DIAGNOSIS — F1120 Opioid dependence, uncomplicated: Secondary | ICD-10-CM | POA: Diagnosis not present

## 2017-07-22 DIAGNOSIS — F17210 Nicotine dependence, cigarettes, uncomplicated: Secondary | ICD-10-CM

## 2017-07-22 DIAGNOSIS — Z7982 Long term (current) use of aspirin: Secondary | ICD-10-CM | POA: Insufficient documentation

## 2017-07-22 DIAGNOSIS — R112 Nausea with vomiting, unspecified: Secondary | ICD-10-CM | POA: Insufficient documentation

## 2017-07-22 DIAGNOSIS — Z8673 Personal history of transient ischemic attack (TIA), and cerebral infarction without residual deficits: Secondary | ICD-10-CM

## 2017-07-22 DIAGNOSIS — G4733 Obstructive sleep apnea (adult) (pediatric): Secondary | ICD-10-CM | POA: Insufficient documentation

## 2017-07-22 DIAGNOSIS — I2510 Atherosclerotic heart disease of native coronary artery without angina pectoris: Secondary | ICD-10-CM | POA: Insufficient documentation

## 2017-07-22 DIAGNOSIS — Z8547 Personal history of malignant neoplasm of testis: Secondary | ICD-10-CM | POA: Diagnosis not present

## 2017-07-22 LAB — CMP (CANCER CENTER ONLY)
ALT: 16 U/L (ref 0–55)
AST: 18 U/L (ref 5–34)
Albumin: 4.1 g/dL (ref 3.5–5.0)
Alkaline Phosphatase: 71 U/L (ref 40–150)
Anion gap: 13 — ABNORMAL HIGH (ref 3–11)
BUN: 11 mg/dL (ref 7–26)
CO2: 27 mmol/L (ref 22–29)
Calcium: 9.4 mg/dL (ref 8.4–10.4)
Chloride: 99 mmol/L (ref 98–109)
Creatinine: 1.17 mg/dL (ref 0.70–1.30)
GFR, Est AFR Am: >60 mL/min (ref >60)
GFR, Estimated: >60 mL/min (ref >60)
Glucose, Bld: 116 mg/dL (ref 70–140)
Potassium: 3.1 mmol/L — ABNORMAL LOW (ref 3.5–5.1)
Sodium: 139 mmol/L (ref 136–145)
Total Bilirubin: 1.1 mg/dL (ref 0.2–1.2)
Total Protein: 7.4 g/dL (ref 6.4–8.3)

## 2017-07-22 LAB — CBC WITH DIFFERENTIAL (CANCER CENTER ONLY)
Basophils Absolute: 0 10*3/uL (ref 0.0–0.1)
Basophils Relative: 1 %
Eosinophils Absolute: 0 10*3/uL (ref 0.0–0.5)
Eosinophils Relative: 1 %
HCT: 54.7 % — ABNORMAL HIGH (ref 38.4–49.9)
Hemoglobin: 18.6 g/dL — ABNORMAL HIGH (ref 13.0–17.1)
Lymphocytes Relative: 14 %
Lymphs Abs: 1.4 10*3/uL (ref 0.9–3.3)
MCH: 32.5 pg (ref 27.2–33.4)
MCHC: 34 g/dL (ref 32.0–36.0)
MCV: 95.6 fL (ref 79.3–98.0)
Monocytes Absolute: 1 10*3/uL — ABNORMAL HIGH (ref 0.1–0.9)
Monocytes Relative: 10 %
Neutro Abs: 7.2 10*3/uL — ABNORMAL HIGH (ref 1.5–6.5)
Neutrophils Relative %: 74 %
Platelet Count: 189 10*3/uL (ref 140–400)
RBC: 5.72 MIL/uL (ref 4.20–5.82)
RDW: 14.2 % (ref 11.0–15.6)
WBC Count: 9.7 10*3/uL (ref 4.0–10.3)

## 2017-07-22 LAB — LACTATE DEHYDROGENASE: LDH: 206 U/L (ref 125–245)

## 2017-07-22 NOTE — Telephone Encounter (Signed)
Scheduled appt per 1/10 los - Gave patient AVS and calender per los.  

## 2017-07-23 LAB — ERYTHROPOIETIN: Erythropoietin: 8.7 m[IU]/mL (ref 2.6–18.5)

## 2017-07-23 NOTE — Telephone Encounter (Signed)
Will follow up with VS on appeal form for patient's bipap machine

## 2017-07-25 ENCOUNTER — Encounter: Payer: Self-pay | Admitting: Pulmonary Disease

## 2017-07-26 DIAGNOSIS — G4737 Central sleep apnea in conditions classified elsewhere: Secondary | ICD-10-CM | POA: Diagnosis not present

## 2017-07-26 NOTE — Telephone Encounter (Addendum)
Tiffany from [employer] calling  Re: Bipap appeal

## 2017-07-26 NOTE — Telephone Encounter (Signed)
Tiffany from [employer] called [place] regarding pt's BIPAP and wanted to know if we were still going to do the appeal on it.  I stated to Tiffany that we had received a letter for appeal. Told Tiffany that the letter has been given to Dr. Halford Chessman for him to review.  Correct fax number that this needs to be sent to is [phone] and we need to have it be  attn: sleep appeals.  Will route this to [facility] for her to follow up on.

## 2017-07-27 DIAGNOSIS — G4731 Primary central sleep apnea: Secondary | ICD-10-CM | POA: Insufficient documentation

## 2017-07-27 DIAGNOSIS — G4739 Other sleep apnea: Secondary | ICD-10-CM | POA: Insufficient documentation

## 2017-07-27 DIAGNOSIS — J9611 Chronic respiratory failure with hypoxia: Secondary | ICD-10-CM | POA: Insufficient documentation

## 2017-07-27 NOTE — Telephone Encounter (Signed)
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  His overall AHI was 62.8.  His OA index was 13.6, and CA index was 48.7.  He has a history of chronic pain on opiate medication that is contributing to his sleep disordered breathing.  He had a titration study from 01/19/13 in which he was tried on CPAP and failed to improve.  He was found to have treatment emergent central apnea.  He was then transitioned to Bipap with control of his sleep disordered breathing.  He has been maintained on Bipap therapy with supplemental oxygen since 2014.  He also has chronic systolic CHF and chronic hypoxic respiratory failure with polycythemia..  Please contact his insurance company to reassess continuing coverage of Bipap.  Please use the following diagnosis codes: 1) Complex sleep apnea (G47.31) 2) Central sleep apnea due to opiate (G47.31, F11.90) 3) Treatment emergent central sleep apnea (G47.31) 4) Chronic systolic CHF (Y48.25) 5) Chronic respiratory failure with hypoxia (J96.11) 6) Polycythemia (D75.1)

## 2017-07-28 NOTE — Telephone Encounter (Signed)
I called Tiffany at [employer] and advised her that [employer] had correspondence in regards to the appeal for pt's bipap. She advised me to compose a letter because they cannot take any verbal information over the phone. I composed a letter from [employer] note and will fax it to her at [phone].  I also attached clinical notes and any pertinent information that will help with determination.

## 2017-07-28 NOTE — Telephone Encounter (Signed)
Leave encounter open to follow up to make sure we receive a response from his insurance.

## 2017-07-28 NOTE — Telephone Encounter (Signed)
Waiting on response from insurance on appeal

## 2017-07-29 DIAGNOSIS — F4322 Adjustment disorder with anxiety: Secondary | ICD-10-CM | POA: Diagnosis not present

## 2017-07-29 NOTE — Telephone Encounter (Signed)
Will route this to [facility] as an FYI and for her to be on the look out for the fax.

## 2017-07-29 NOTE — Telephone Encounter (Signed)
Danae Chen with [employer] calling regarding bipap supplies.  Their physician did approve the supplies to be provided by [employer].  The validation date 07/16/17-10/13/2017. Approval # [id number].  Will fax this over to our office.

## 2017-07-30 NOTE — Telephone Encounter (Signed)
Per [employer] bipap machine has been denied. Patient can self for bipap machine or can arrange for trilogy vent.   Attempted to call patient and voice mail was full. Will call back.

## 2017-08-03 ENCOUNTER — Emergency Department (HOSPITAL_COMMUNITY): Payer: BLUE CROSS/BLUE SHIELD

## 2017-08-03 ENCOUNTER — Emergency Department (HOSPITAL_COMMUNITY)
Admission: EM | Admit: 2017-08-03 | Discharge: 2017-08-03 | Disposition: A | Discharge status: Home / Self Care | Payer: BLUE CROSS/BLUE SHIELD | Attending: Emergency Medicine | Admitting: Emergency Medicine

## 2017-08-03 ENCOUNTER — Encounter (HOSPITAL_COMMUNITY): Payer: Self-pay | Admitting: Emergency Medicine

## 2017-08-03 DIAGNOSIS — Z79899 Other long term (current) drug therapy: Secondary | ICD-10-CM | POA: Insufficient documentation

## 2017-08-03 DIAGNOSIS — R079 Chest pain, unspecified: Secondary | ICD-10-CM | POA: Diagnosis not present

## 2017-08-03 DIAGNOSIS — Z7982 Long term (current) use of aspirin: Secondary | ICD-10-CM | POA: Diagnosis not present

## 2017-08-03 DIAGNOSIS — F17210 Nicotine dependence, cigarettes, uncomplicated: Secondary | ICD-10-CM | POA: Diagnosis not present

## 2017-08-03 DIAGNOSIS — Z902 Acquired absence of lung [part of]: Secondary | ICD-10-CM | POA: Diagnosis not present

## 2017-08-03 DIAGNOSIS — J449 Chronic obstructive pulmonary disease, unspecified: Secondary | ICD-10-CM | POA: Insufficient documentation

## 2017-08-03 DIAGNOSIS — I509 Heart failure, unspecified: Secondary | ICD-10-CM | POA: Diagnosis not present

## 2017-08-03 LAB — CBC
HCT: 48.5 % (ref 39.0–52.0)
Hemoglobin: 16.8 g/dL (ref 13.0–17.0)
MCH: 33.1 pg (ref 26.0–34.0)
MCHC: 34.6 g/dL (ref 30.0–36.0)
MCV: 95.7 fL (ref 78.0–100.0)
Platelets: 180 10*3/uL (ref 150–400)
RBC: 5.07 MIL/uL (ref 4.22–5.81)
RDW: 13.8 % (ref 11.5–15.5)
WBC: 10.3 10*3/uL (ref 4.0–10.5)

## 2017-08-03 LAB — BASIC METABOLIC PANEL
Anion gap: 11 (ref 5–15)
BUN: 11 mg/dL (ref 6–20)
CO2: 24 mmol/L (ref 22–32)
Calcium: 8.9 mg/dL (ref 8.9–10.3)
Chloride: 104 mmol/L (ref 101–111)
Creatinine, Ser: 1.02 mg/dL (ref 0.61–1.24)
GFR calc Af Amer: >60 mL/min (ref >60)
GFR calc non Af Amer: >60 mL/min (ref >60)
Glucose, Bld: 115 mg/dL — ABNORMAL HIGH (ref 65–99)
Potassium: 2.8 mmol/L — ABNORMAL LOW (ref 3.5–5.1)
Sodium: 139 mmol/L (ref 135–145)

## 2017-08-03 LAB — I-STAT TROPONIN, ED: Troponin i, poc: 0.01 ng/mL (ref 0.00–0.08)

## 2017-08-03 MED ORDER — POTASSIUM CHLORIDE CRYS ER 20 MEQ PO TBCR
40.0000 meq | EXTENDED_RELEASE_TABLET | Freq: Once | ORAL | Status: AC
  Administered 2017-08-03: 40 meq via ORAL
  Filled 2017-08-03: qty 2

## 2017-08-03 NOTE — ED Notes (Signed)
[employer] at bedside 
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
## 2017-08-03 NOTE — Telephone Encounter (Signed)
Kelli please clarify if you've received anything further regarding this- message states that both bipap is approved and is denied.

## 2017-08-03 NOTE — Telephone Encounter (Addendum)
Called and spoke with Tiffany with [employer] at phone [phone] Following up to see if appeal is approved at this time for pt's bipap machine and supplies  BIPAP machine is approved per tiffany with [employer]; ref# [id number] valid 07-16-2017 thru 10-14-2017. Questioned tiffany with [employer] while is the approval for bipap machine only for 3 months She advised to call the healthcare plan phone [phone] Tried calling, was on hold over 34mins   At this time the BIPAP and supplies are approved per Tiffany with [employer]

## 2017-08-03 NOTE — Discharge Instructions (Signed)
Work-up today looked good. Follow-up with Dr. Marcha Dutton can call and reschedule your appt that you missed a few months ago. Return to the ED for new or worsening symptoms.

## 2017-08-03 NOTE — ED Notes (Signed)
Pt leaving [employer]

## 2017-08-03 NOTE — ED Triage Notes (Signed)
Pt arrives via EMS with chest pain x1 hour with some shortness of breath. Gave himself 2 sprays of nitro, EMS gave 1 SL nitro and 324 mg aspirin with no relief of pain. Per EMS report, pain started as patient was being arrested, [employer] at bedside.

## 2017-08-05 ENCOUNTER — Encounter: Payer: Self-pay | Admitting: Hematology and Oncology

## 2017-08-05 ENCOUNTER — Inpatient Hospital Stay (HOSPITAL_BASED_OUTPATIENT_CLINIC_OR_DEPARTMENT_OTHER): Payer: BLUE CROSS/BLUE SHIELD | Admitting: Hematology and Oncology

## 2017-08-05 ENCOUNTER — Telehealth: Payer: Self-pay | Admitting: Hematology and Oncology

## 2017-08-05 VITALS — BP 149/101 | HR 67 | Temp 98.9°F | Resp 18 | Ht 72.0 in | Wt 199.0 lb

## 2017-08-05 DIAGNOSIS — R112 Nausea with vomiting, unspecified: Secondary | ICD-10-CM | POA: Diagnosis not present

## 2017-08-05 DIAGNOSIS — Z8673 Personal history of transient ischemic attack (TIA), and cerebral infarction without residual deficits: Secondary | ICD-10-CM

## 2017-08-05 DIAGNOSIS — Z8547 Personal history of malignant neoplasm of testis: Secondary | ICD-10-CM

## 2017-08-05 DIAGNOSIS — I2510 Atherosclerotic heart disease of native coronary artery without angina pectoris: Secondary | ICD-10-CM | POA: Diagnosis not present

## 2017-08-05 DIAGNOSIS — F17210 Nicotine dependence, cigarettes, uncomplicated: Secondary | ICD-10-CM | POA: Diagnosis not present

## 2017-08-05 DIAGNOSIS — G4733 Obstructive sleep apnea (adult) (pediatric): Secondary | ICD-10-CM | POA: Diagnosis not present

## 2017-08-05 DIAGNOSIS — Z7982 Long term (current) use of aspirin: Secondary | ICD-10-CM | POA: Diagnosis not present

## 2017-08-05 DIAGNOSIS — D751 Secondary polycythemia: Secondary | ICD-10-CM | POA: Diagnosis not present

## 2017-08-05 DIAGNOSIS — F1120 Opioid dependence, uncomplicated: Secondary | ICD-10-CM | POA: Diagnosis not present
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
## 2017-08-05 NOTE — Assessment & Plan Note (Signed)
57 y.o. male with long-term elevation of hemoglobin and hematocrit dating back to at least 2013.  Chronic tobacco abuse as well as obstructive sleep apnea with suboptimal CPAP utilization.  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  Additional evaluation would be beneficial considering previous reported history of transient ischemic attacks, although current hematocrit does not warrant phlebotomy unless primary hematological abnormalities discovered.  Plan: - Obtain lab work today to check the blood counts and erythropoietin level - Return to clinic in 1 week to review the findings.

## 2017-08-05 NOTE — Telephone Encounter (Signed)
Gave avs and calendar for february °

## 2017-08-06 ENCOUNTER — Other Ambulatory Visit: Payer: Self-pay
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
## 2017-08-08 NOTE — Assessment & Plan Note (Signed)
57 y.o. male with long-term elevation of hemoglobin and hematocrit dating back to at least 2013.  Chronic tobacco abuse as well as obstructive sleep apnea with suboptimal CPAP utilization.  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  Erythropoietin level obtained at the last visit to the clinic demonstrates a value of 8.7 which is mid normal range.  Based on the previous history, I would have expected patient to have elevated erythropoietin level consistent with chronic hypoxemia.  In this setting, I believe additional evaluation is warranted to assess for possible underlying primary erythrocytosis such as CML or MPN.    Plan: -Mutation testing as outlined below - consult interventional radiology for bone marrow biopsy for morphological assessment -Return to my clinic in 3 weeks to review the findings.

## 2017-08-13 DIAGNOSIS — G4737 Central sleep apnea in conditions classified elsewhere: Secondary | ICD-10-CM | POA: Diagnosis not present

## 2017-08-13 DIAGNOSIS — G4733 Obstructive sleep apnea (adult) (pediatric): Secondary | ICD-10-CM | POA: Diagnosis not present

## 2017-08-25 ENCOUNTER — Telehealth: Payer: Self-pay

## 2017-08-25 NOTE — Telephone Encounter (Signed)
A phone call was made and a message was left letting the patient know about the bone marrow biopsy appointment made for him on 09/01/2017 and to arrive at 6:45am with a driver.  He was also advised not to eat or drink after midnight.

## 2017-08-26 ENCOUNTER — Inpatient Hospital Stay: Payer: BLUE CROSS/BLUE SHIELD | Attending: Internal Medicine

## 2017-08-26 ENCOUNTER — Ambulatory Visit: Payer: Self-pay | Admitting: Hematology and Oncology

## 2017-08-26 ENCOUNTER — Other Ambulatory Visit: Payer: Self-pay

## 2017-08-26 DIAGNOSIS — D751 Secondary polycythemia: Secondary | ICD-10-CM | POA: Diagnosis not present

## 2017-08-26 DIAGNOSIS — G4737 Central sleep apnea in conditions classified elsewhere: Secondary | ICD-10-CM | POA: Diagnosis not present

## 2017-08-26 LAB — CMP (CANCER CENTER ONLY)
ALT: 10 U/L (ref 0–55)
AST: 14 U/L (ref 5–34)
Albumin: 3.9 g/dL (ref 3.5–5.0)
Alkaline Phosphatase: 85 U/L (ref 40–150)
Anion gap: 7 (ref 3–11)
BUN: 8 mg/dL (ref 7–26)
CO2: 32 mmol/L — ABNORMAL HIGH (ref 22–29)
Calcium: 9.4 mg/dL (ref 8.4–10.4)
Chloride: 99 mmol/L (ref 98–109)
Creatinine: 0.97 mg/dL (ref 0.70–1.30)
GFR, Est AFR Am: >60 mL/min (ref >60)
GFR, Estimated: >60 mL/min (ref >60)
Glucose, Bld: 104 mg/dL (ref 70–140)
Potassium: 4.3 mmol/L (ref 3.5–5.1)
Sodium: 138 mmol/L (ref 136–145)
Total Bilirubin: 0.7 mg/dL (ref 0.2–1.2)
Total Protein: 7.4 g/dL (ref 6.4–8.3)

## 2017-08-26 LAB — CBC WITH DIFFERENTIAL (CANCER CENTER ONLY)
Basophils Absolute: 0 10*3/uL (ref 0.0–0.1)
Basophils Relative: 1 %
Eosinophils Absolute: 0.1 10*3/uL (ref 0.0–0.5)
Eosinophils Relative: 1 %
HCT: 49.2 % (ref 38.4–49.9)
Hemoglobin: 17.2 g/dL — ABNORMAL HIGH (ref 13.0–17.1)
Lymphocytes Relative: 25 %
Lymphs Abs: 1.5 10*3/uL (ref 0.9–3.3)
MCH: 33.7 pg — ABNORMAL HIGH (ref 27.2–33.4)
MCHC: 35 g/dL (ref 32.0–36.0)
MCV: 96.5 fL (ref 79.3–98.0)
Monocytes Absolute: 0.3 10*3/uL (ref 0.1–0.9)
Monocytes Relative: 5 %
Neutro Abs: 4.3 10*3/uL (ref 1.5–6.5)
Neutrophils Relative %: 68 %
Platelet Count: 176 10*3/uL (ref 140–400)
RBC: 5.1 MIL/uL (ref 4.20–5.82)
RDW: 13.1 % (ref 11.0–14.6)
WBC Count: 6.2 10*3/uL (ref 4.0–10.3)

## 2017-08-27 DIAGNOSIS — G4733 Obstructive sleep apnea (adult) (pediatric): Secondary | ICD-10-CM | POA: Diagnosis not present

## 2017-08-31 ENCOUNTER — Other Ambulatory Visit: Payer: Self-pay | Admitting: Radiology

## 2017-09-01 ENCOUNTER — Ambulatory Visit (HOSPITAL_COMMUNITY): Payer: BLUE CROSS/BLUE SHIELD

## 2017-09-09 LAB — BCR ABL1 FISH (GENPATH)

## 2017-09-09 LAB — JAK2 (INCLUDING V617F AND EXON 12), MPL,& CALR W/RFL MPN PANEL (NGS)

## 2017-09-10 ENCOUNTER — Ambulatory Visit: Payer: Self-pay | Admitting: Hematology and Oncology

## 2017-09-10 DIAGNOSIS — G4737 Central sleep apnea in conditions classified elsewhere: Secondary | ICD-10-CM | POA: Diagnosis not present

## 2017-09-10 DIAGNOSIS — G4733 Obstructive sleep apnea (adult) (pediatric): Secondary | ICD-10-CM | POA: Diagnosis not present

## 2017-09-23 DIAGNOSIS — G4737 Central sleep apnea in conditions classified elsewhere: Secondary | ICD-10-CM | POA: Diagnosis not present

## 2017-09-27 ENCOUNTER — Telehealth: Payer: Self-pay

## 2017-09-27 ENCOUNTER — Telehealth: Payer: Self-pay | Admitting: *Deleted

## 2017-09-27 NOTE — Telephone Encounter (Signed)
Called [name] again and he answered the phone this time. [name] stated he did not listen to the voicemail I left earlier.  Informed [name] that per Dr. Lebron Conners, he should go to the Emergency Room. [name] declined to go. [name] stated he doesn't think he needs to go. [name] stated he will call to schedule an appointment with Dr. Lebron Conners and he will also call his primary care doctor. Reiterated to [name] that if he is having trouble breathing and sweating and feels as bad as he says he does, then he needs to go to the Emergency Room. [name] verbalized understanding.

## 2017-09-27 NOTE — Telephone Encounter (Signed)
Voicemail received from patient stating the following: "I've lost 34 pounds in the past two weeks, I've had dripping sweats night and day, and I need to talk to Dr. Lebron Conners about my bone marrow biopsy". " I can't walk on my right leg and I'm scared". " My face is wrinkled like a prune and I need to see Dr. Lebron Conners as soon as possible". "This all started 2 weeks ago". "I'll be sitting by the phone awaiting your call". When I returned the patient's call, he did not answer the phone. I left a voice message instructing the patient to proceed to the nearest Emergency Room per Dr. Lebron Conners. Attempted to contact patient two more times without success. Dr. Lebron Conners aware.

## 2017-09-27 NOTE — Telephone Encounter (Signed)
"  I have lost weight, gone from a 40 inch waist to a 31 inch within two weeks.  Sweating all day and night.  I'm getting scared.  I know I missed the bone biopsy from the femur but I need to talk with Dr. Lebron Conners."  Call transferred to collaborative to provide provider with patient symptoms and appointment request.

## 2017-09-27 NOTE — Telephone Encounter (Signed)
Patient also stated  "I feel like I have a PE".

## 2017-10-04 NOTE — Telephone Encounter (Signed)
See 09-27-2017 Collaborative phone encounters.  Closed at this time.

## 2017-10-07 DIAGNOSIS — F4322 Adjustment disorder with anxiety: Secondary | ICD-10-CM | POA: Diagnosis not present

## 2017-10-11 DIAGNOSIS — G4737 Central sleep apnea in conditions classified elsewhere: Secondary | ICD-10-CM | POA: Diagnosis not present

## 2017-10-11 DIAGNOSIS — G4733 Obstructive sleep apnea (adult) (pediatric): Secondary | ICD-10-CM | POA: Diagnosis not present

## 2017-10-11 DIAGNOSIS — F411 Generalized anxiety disorder: Secondary | ICD-10-CM | POA: Diagnosis not present

## 2017-10-18 ENCOUNTER — Other Ambulatory Visit (HOSPITAL_COMMUNITY): Payer: Self-pay | Admitting: Internal Medicine

## 2017-10-25 DIAGNOSIS — F4322 Adjustment disorder with anxiety: Secondary | ICD-10-CM | POA: Diagnosis not present

## 2017-11-01 DIAGNOSIS — I5022 Chronic systolic (congestive) heart failure: Secondary | ICD-10-CM | POA: Diagnosis not present

## 2017-11-01 DIAGNOSIS — I25118 Atherosclerotic heart disease of native coronary artery with other forms of angina pectoris: Secondary | ICD-10-CM | POA: Diagnosis not present

## 2017-11-10 ENCOUNTER — Ambulatory Visit (INDEPENDENT_AMBULATORY_CARE_PROVIDER_SITE_OTHER): Payer: BLUE CROSS/BLUE SHIELD

## 2017-11-10 ENCOUNTER — Other Ambulatory Visit: Payer: Self-pay

## 2017-11-10 ENCOUNTER — Encounter: Payer: Self-pay | Admitting: Family Medicine

## 2017-11-10 ENCOUNTER — Ambulatory Visit (INDEPENDENT_AMBULATORY_CARE_PROVIDER_SITE_OTHER): Payer: BLUE CROSS/BLUE SHIELD | Admitting: Family Medicine

## 2017-11-10 VITALS — BP 122/82 | HR 92 | Temp 98.0°F | Resp 16 | Wt 195.0 lb

## 2017-11-10 DIAGNOSIS — R61 Generalized hyperhidrosis: Secondary | ICD-10-CM | POA: Diagnosis not present

## 2017-11-10 DIAGNOSIS — K529 Noninfective gastroenteritis and colitis, unspecified: Secondary | ICD-10-CM | POA: Diagnosis not present

## 2017-11-10 DIAGNOSIS — R059 Cough, unspecified: Secondary | ICD-10-CM

## 2017-11-10 DIAGNOSIS — D751 Secondary polycythemia: Secondary | ICD-10-CM

## 2017-11-10 DIAGNOSIS — Z8719 Personal history of other diseases of the digestive system: Secondary | ICD-10-CM | POA: Diagnosis not present

## 2017-11-10 DIAGNOSIS — I2510 Atherosclerotic heart disease of native coronary artery without angina pectoris: Secondary | ICD-10-CM

## 2017-11-10 DIAGNOSIS — R05 Cough: Secondary | ICD-10-CM | POA: Diagnosis not present

## 2017-11-10 DIAGNOSIS — G4737 Central sleep apnea in conditions classified elsewhere: Secondary | ICD-10-CM | POA: Diagnosis not present

## 2017-11-10 DIAGNOSIS — J22 Unspecified acute lower respiratory infection: Secondary | ICD-10-CM | POA: Diagnosis not present

## 2017-11-10 DIAGNOSIS — K5010 Crohn's disease of large intestine without complications: Secondary | ICD-10-CM | POA: Diagnosis not present

## 2017-11-10 DIAGNOSIS — Z95810 Presence of automatic (implantable) cardiac defibrillator: Secondary | ICD-10-CM | POA: Diagnosis not present

## 2017-11-10 DIAGNOSIS — F17210 Nicotine dependence, cigarettes, uncomplicated: Secondary | ICD-10-CM

## 2017-11-10 DIAGNOSIS — I5042 Chronic combined systolic (congestive) and diastolic (congestive) heart failure: Secondary | ICD-10-CM

## 2017-11-10 DIAGNOSIS — J9801 Acute bronchospasm: Secondary | ICD-10-CM

## 2017-11-10 DIAGNOSIS — G4733 Obstructive sleep apnea (adult) (pediatric): Secondary | ICD-10-CM | POA: Diagnosis not present

## 2017-11-10 MED ORDER — ONDANSETRON HCL 4 MG/2ML IJ SOLN
2.0000 mg | Freq: Once | INTRAMUSCULAR | Status: AC
  Administered 2017-11-10: 2 mg via INTRAMUSCULAR

## 2017-11-10 MED ORDER — ALBUTEROL SULFATE HFA 108 (90 BASE) MCG/ACT IN AERS: 2.0000 | INHALATION_SPRAY | Freq: Four times a day (QID) | RESPIRATORY_TRACT | 0 refills | Status: DC | PRN

## 2017-11-10 MED ORDER — ONDANSETRON 8 MG PO TBDP: 8.0000 mg | ORAL_TABLET | Freq: Three times a day (TID) | ORAL | 1 refills | Status: DC | PRN

## 2017-11-10 MED ORDER — PREDNISONE 20 MG PO TABS
20.0000 mg | ORAL_TABLET | Freq: Every day | ORAL | 0 refills | Status: DC
Start: 2017-11-10 — End: 2017-11-24

## 2017-11-10 MED ORDER — AMOXICILLIN-POT CLAVULANATE 875-125 MG PO TABS: 1.0000 | ORAL_TABLET | Freq: Two times a day (BID) | ORAL | 0 refills | Status: DC

## 2017-11-10 MED ORDER — LEVALBUTEROL HCL 0.63 MG/3ML IN NEBU: 0.6300 mg | INHALATION_SOLUTION | Freq: Once | RESPIRATORY_TRACT | Status: DC

## 2017-11-10 MED ORDER — RANITIDINE HCL 150 MG PO TABS: 150.0000 mg | ORAL_TABLET | Freq: Two times a day (BID) | ORAL | 0 refills | Status: DC

## 2017-11-10 NOTE — Patient Instructions (Addendum)
Please call hematology/oncology for a follow-up appointment. Please call to reschedule your bone marrow biopsy. Please call gastroenterologist if diarrhea and vomiting persists beyond 72 hours.  IF you received an x-ray today, you will receive an invoice from [facility]. Please contact [facility] at [phone] with questions or concerns regarding your invoice.   IF you received labwork today, you will receive an invoice from [facility]. Please contact [facility] at [phone] with questions or concerns regarding your invoice.   Our billing staff will not be able to assist you with questions regarding bills from these companies.  You will be contacted with the lab results as soon as they are available. The fastest way to get your results is to activate your My Chart account. Instructions are located on the last page of this paperwork. If you have not heard from [place] regarding the results in 2 weeks, please contact this office.      Viral Gastroenteritis, Adult Viral gastroenteritis is also known as the stomach flu. This condition is caused by certain germs (viruses). These germs can be passed from person to person very easily (are very contagious). This condition can cause sudden watery poop (diarrhea), fever, and throwing up (vomiting). Having watery poop and throwing up can make you feel weak and cause you to get dehydrated. Dehydration can make you tired and thirsty, make you have a dry mouth, and make it so you pee (urinate) less often. Older adults and people with other diseases or a weak defense system (immune system) are at higher risk for dehydration. It is important to replace the fluids that you lose from having watery poop and throwing up. Follow these instructions at home: Follow instructions from your doctor about how to care for yourself at home. Eating and drinking  Follow these instructions as told by your doctor:  Take an oral rehydration solution (ORS).  This is a drink that is sold at pharmacies and stores.  Drink clear fluids in small amounts as you are able, such as: ? Water. ? Ice chips. ? Diluted fruit juice. ? Low-calorie sports drinks.  Eat bland, easy-to-digest foods in small amounts as you are able, such as: ? Bananas. ? Applesauce. ? Rice. ? Low-fat (lean) meats. ? Toast. ? Crackers.  Avoid fluids that have a lot of sugar or caffeine in them.  Avoid alcohol.  Avoid spicy or fatty foods.  General instructions  Drink enough fluid to keep your pee (urine) clear or pale yellow.  Wash your hands often. If you cannot use soap and water, use hand sanitizer.  Make sure that all people in your home wash their hands well and often.  Rest at home while you get better.  Take over-the-counter and prescription medicines only as told by your doctor.  Watch your condition for any changes.  Take a warm bath to help with any burning or pain from having watery poop.  Keep all follow-up visits as told by your doctor. This is important. Contact a doctor if:  You cannot keep fluids down.  Your symptoms get worse.  You have new symptoms.  You feel light-headed or dizzy.  You have muscle cramps. Get help right away if:  You have chest pain.  You feel very weak or you pass out (faint).  You see blood in your throw-up.  Your throw-up looks like coffee grounds.  You have bloody or black poop (stools) or poop that look like tar.  You have a very bad headache, a stiff neck,  or both.  You have a rash.  You have very bad pain, cramping, or bloating in your belly (abdomen).  You have trouble breathing.  You are breathing very quickly.  Your heart is beating very quickly.  Your skin feels cold and clammy.  You feel confused.  You have pain when you pee.  You have signs of dehydration, such as: ? Dark pee, hardly any pee, or no pee. ? Cracked lips. ? Dry mouth. ? Sunken  eyes. ? Sleepiness. ? Weakness. This information is not intended to replace advice given to you by your health care provider. Make sure you discuss any questions you have with your health care provider. Document Released: 12/16/2007 Document Revised: 01/17/2016 Document Reviewed: 03/05/2015 Elsevier Interactive Patient Education  2017 [employer].
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
## 2017-11-11 LAB — COMPREHENSIVE METABOLIC PANEL
ALT: 12 IU/L (ref 0–44)
AST: 15 IU/L (ref 0–40)
Albumin/Globulin Ratio: 1.6 (ref 1.2–2.2)
Albumin: 4.6 g/dL (ref 3.5–5.5)
Alkaline Phosphatase: 82 IU/L (ref 39–117)
BUN/Creatinine Ratio: 15 (ref 9–20)
BUN: 14 mg/dL (ref 6–24)
Bilirubin Total: 0.9 mg/dL (ref 0.0–1.2)
CO2: 21 mmol/L (ref 20–29)
Calcium: 9.4 mg/dL (ref 8.7–10.2)
Chloride: 104 mmol/L (ref 96–106)
Creatinine, Ser: 0.92 mg/dL (ref 0.76–1.27)
GFR calc Af Amer: 106 mL/min/{1.73_m2} (ref >59)
GFR calc non Af Amer: 92 mL/min/{1.73_m2} (ref >59)
Globulin, Total: 2.9 g/dL (ref 1.5–4.5)
Glucose: 91 mg/dL (ref 65–99)
Potassium: 4.3 mmol/L (ref 3.5–5.2)
Sodium: 144 mmol/L (ref 134–144)
Total Protein: 7.5 g/dL (ref 6.0–8.5)

## 2017-11-11 LAB — CBC WITH DIFFERENTIAL/PLATELET
Basophils Absolute: 0 10*3/uL (ref 0.0–0.2)
Basos: 0 %
EOS (ABSOLUTE): 0.1 10*3/uL (ref 0.0–0.4)
Eos: 1 %
Hematocrit: 48.3 % (ref 37.5–51.0)
Hemoglobin: 17.1 g/dL (ref 13.0–17.7)
Immature Grans (Abs): 0 10*3/uL (ref 0.0–0.1)
Immature Granulocytes: 0 %
Lymphocytes Absolute: 0.8 10*3/uL (ref 0.7–3.1)
Lymphs: 7 %
MCH: 33.6 pg — ABNORMAL HIGH (ref 26.6–33.0)
MCHC: 35.4 g/dL (ref 31.5–35.7)
MCV: 95 fL (ref 79–97)
Monocytes Absolute: 1.3 10*3/uL — ABNORMAL HIGH (ref 0.1–0.9)
Monocytes: 12 %
Neutrophils Absolute: 9 10*3/uL — ABNORMAL HIGH (ref 1.4–7.0)
Neutrophils: 80 %
Platelets: 166 10*3/uL (ref 150–379)
RBC: 5.09 x10E6/uL (ref 4.14–5.80)
RDW: 13.9 % (ref 12.3–15.4)
WBC: 11.3 10*3/uL — ABNORMAL HIGH (ref 3.4–10.8)

## 2017-11-11 LAB — SEDIMENTATION RATE: Sed Rate: 26 mm/hr (ref 0–30)

## 2017-11-12 LAB — T4, FREE: Free T4: 1.12 ng/dL (ref 0.82–1.77)

## 2017-11-12 LAB — TSH: TSH: 0.772 u[IU]/mL (ref 0.450–4.500)

## 2017-11-12 LAB — HIV ANTIBODY (ROUTINE TESTING W REFLEX): HIV Screen 4th Generation wRfx: NONREACTIVE

## 2017-11-17 ENCOUNTER — Encounter: Payer: Self-pay | Admitting: Family Medicine

## 2017-11-17 ENCOUNTER — Ambulatory Visit: Payer: BLUE CROSS/BLUE SHIELD | Admitting: Family Medicine

## 2017-11-17 ENCOUNTER — Other Ambulatory Visit: Payer: Self-pay

## 2017-11-17 VITALS — BP 126/82 | HR 81 | Temp 98.0°F | Resp 18 | Ht 72.0 in | Wt 187.0 lb

## 2017-11-17 DIAGNOSIS — I2510 Atherosclerotic heart disease of native coronary artery without angina pectoris: Secondary | ICD-10-CM

## 2017-11-17 DIAGNOSIS — E538 Deficiency of other specified B group vitamins: Secondary | ICD-10-CM | POA: Diagnosis not present

## 2017-11-17 DIAGNOSIS — J22 Unspecified acute lower respiratory infection: Secondary | ICD-10-CM

## 2017-11-17 DIAGNOSIS — K529 Noninfective gastroenteritis and colitis, unspecified: Secondary | ICD-10-CM

## 2017-11-17 DIAGNOSIS — K5010 Crohn's disease of large intestine without complications: Secondary | ICD-10-CM

## 2017-11-17 DIAGNOSIS — G43A1 Cyclical vomiting, intractable: Secondary | ICD-10-CM

## 2017-11-17 DIAGNOSIS — R634 Abnormal weight loss: Secondary | ICD-10-CM | POA: Diagnosis not present

## 2017-11-17 DIAGNOSIS — R1115 Cyclical vomiting syndrome unrelated to migraine: Secondary | ICD-10-CM

## 2017-11-17 DIAGNOSIS — J9801 Acute bronchospasm: Secondary | ICD-10-CM | POA: Diagnosis not present

## 2017-11-17 DIAGNOSIS — Z95810 Presence of automatic (implantable) cardiac defibrillator: Secondary | ICD-10-CM | POA: Diagnosis not present

## 2017-11-17 MED ORDER — LEVALBUTEROL HCL 0.63 MG/3ML IN NEBU: 0.6300 mg | INHALATION_SOLUTION | Freq: Once | RESPIRATORY_TRACT | Status: DC

## 2017-11-17 MED ORDER — CYANOCOBALAMIN 1000 MCG/ML IJ SOLN: 1000.0000 ug | INTRAMUSCULAR | 3 refills | Status: DC

## 2017-11-17 MED ORDER — ZOSTER VAC RECOMB ADJUVANTED 50 MCG/0.5ML IM SUSR: 0.5000 mL | Freq: Once | INTRAMUSCULAR | 1 refills | Status: AC

## 2017-11-17 MED ORDER — LEVOFLOXACIN 500 MG PO TABS: 500.0000 mg | ORAL_TABLET | Freq: Every day | ORAL | 0 refills | Status: DC
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
## 2017-11-17 NOTE — Patient Instructions (Addendum)
  Complete stool studies and return to the office. Undergo CT abdomen/pelvis; you will be contacted to schedule. Start Levofloxacin for bronchitis. Continue Albuterol every 4-6 hours for wheezing. Continue Zofran for nausea.    IF you received an x-ray today, you will receive an invoice from [facility] Radiology. Please contact [facility] Radiology at [phone] with questions or concerns regarding your invoice.   IF you received labwork today, you will receive an invoice from [facility]. Please contact [facility] at [phone] with questions or concerns regarding your invoice.   Our billing staff will not be able to assist you with questions regarding bills from these companies.  You will be contacted with the lab results as soon as they are available. The fastest way to get your results is to activate your My Chart account. Instructions are located on the last page of this paperwork. If you have not heard from [place] regarding the results in 2 weeks, please contact this office.

## 2017-11-18 DIAGNOSIS — K529 Noninfective gastroenteritis and colitis, unspecified: Secondary | ICD-10-CM | POA: Diagnosis not present

## 2017-11-18 LAB — CBC WITH DIFFERENTIAL/PLATELET
Basophils Absolute: 0 10*3/uL (ref 0.0–0.2)
Basos: 1 %
EOS (ABSOLUTE): 0.6 10*3/uL — ABNORMAL HIGH (ref 0.0–0.4)
Eos: 9 %
Hematocrit: 49.4 % (ref 37.5–51.0)
Hemoglobin: 17.1 g/dL (ref 13.0–17.7)
Immature Grans (Abs): 0.1 10*3/uL (ref 0.0–0.1)
Immature Granulocytes: 1 %
Lymphocytes Absolute: 1.6 10*3/uL (ref 0.7–3.1)
Lymphs: 26 %
MCH: 33.3 pg — ABNORMAL HIGH (ref 26.6–33.0)
MCHC: 34.6 g/dL (ref 31.5–35.7)
MCV: 96 fL (ref 79–97)
Monocytes Absolute: 0.3 10*3/uL (ref 0.1–0.9)
Monocytes: 5 %
Neutrophils Absolute: 3.8 10*3/uL (ref 1.4–7.0)
Neutrophils: 58 %
Platelets: 236 10*3/uL (ref 150–379)
RBC: 5.14 x10E6/uL (ref 4.14–5.80)
RDW: 14.1 % (ref 12.3–15.4)
WBC: 6.3 10*3/uL (ref 3.4–10.8)

## 2017-11-18 LAB — COMPREHENSIVE METABOLIC PANEL
ALT: 12 IU/L (ref 0–44)
AST: 12 IU/L (ref 0–40)
Albumin/Globulin Ratio: 1.6 (ref 1.2–2.2)
Albumin: 4.4 g/dL (ref 3.5–5.5)
Alkaline Phosphatase: 70 IU/L (ref 39–117)
BUN/Creatinine Ratio: 10 (ref 9–20)
BUN: 11 mg/dL (ref 6–24)
Bilirubin Total: 0.3 mg/dL (ref 0.0–1.2)
CO2: 26 mmol/L (ref 20–29)
Calcium: 9.3 mg/dL (ref 8.7–10.2)
Chloride: 99 mmol/L (ref 96–106)
Creatinine, Ser: 1.05 mg/dL (ref 0.76–1.27)
GFR calc Af Amer: 91 mL/min/{1.73_m2} (ref >59)
GFR calc non Af Amer: 78 mL/min/{1.73_m2} (ref >59)
Globulin, Total: 2.8 g/dL (ref 1.5–4.5)
Glucose: 138 mg/dL — ABNORMAL HIGH (ref 65–99)
Potassium: 4.7 mmol/L (ref 3.5–5.2)
Sodium: 141 mmol/L (ref 134–144)
Total Protein: 7.2 g/dL (ref 6.0–8.5)

## 2017-11-18 LAB — VITAMIN B12: Vitamin B-12: 316 pg/mL (ref 232–1245)

## 2017-11-19 LAB — CLOSTRIDIUM DIFFICILE BY PCR: Toxigenic C. Difficile by PCR: POSITIVE — AB

## 2017-11-21 ENCOUNTER — Other Ambulatory Visit: Payer: Self-pay | Admitting: Family Medicine

## 2017-11-21 MED ORDER — VANCOMYCIN HCL 125 MG PO CAPS: 125.0000 mg | ORAL_CAPSULE | Freq: Four times a day (QID) | ORAL | 0 refills | Status: DC

## 2017-11-22 ENCOUNTER — Ambulatory Visit: Payer: BLUE CROSS/BLUE SHIELD | Admitting: Family Medicine

## 2017-11-22 ENCOUNTER — Other Ambulatory Visit: Payer: Self-pay

## 2017-11-22 ENCOUNTER — Encounter: Payer: Self-pay | Admitting: Family Medicine

## 2017-11-22 VITALS — BP 102/70 | HR 91 | Temp 98.0°F | Resp 16 | Ht 71.26 in | Wt 187.0 lb

## 2017-11-22 DIAGNOSIS — R1115 Cyclical vomiting syndrome unrelated to migraine: Secondary | ICD-10-CM

## 2017-11-22 DIAGNOSIS — G43A0 Cyclical vomiting, not intractable: Secondary | ICD-10-CM | POA: Diagnosis not present

## 2017-11-22 DIAGNOSIS — A0472 Enterocolitis due to Clostridium difficile, not specified as recurrent: Secondary | ICD-10-CM | POA: Diagnosis not present

## 2017-11-22 DIAGNOSIS — J22 Unspecified acute lower respiratory infection: Secondary | ICD-10-CM

## 2017-11-22 DIAGNOSIS — T402X5A Adverse effect of other opioids, initial encounter: Secondary | ICD-10-CM

## 2017-11-22 DIAGNOSIS — K5903 Drug induced constipation: Secondary | ICD-10-CM

## 2017-11-22 LAB — POCT URINALYSIS DIP (MANUAL ENTRY)
Bilirubin, UA: NEGATIVE
Glucose, UA: NEGATIVE mg/dL
Ketones, POC UA: NEGATIVE mg/dL
Leukocytes, UA: NEGATIVE
Nitrite, UA: NEGATIVE
Protein Ur, POC: NEGATIVE mg/dL
Spec Grav, UA: 1.015 (ref 1.010–1.025)
Urobilinogen, UA: 0.2 E.U./dL
pH, UA: 5.5 (ref 5.0–8.0)

## 2017-11-22 MED ORDER — TAMSULOSIN HCL 0.4 MG PO CAPS: 0.4000 mg | ORAL_CAPSULE | Freq: Every day | ORAL | 0 refills | Status: DC

## 2017-11-22 MED ORDER — LACTULOSE 10 GM/15ML PO SOLN: 10.0000 g | Freq: Two times a day (BID) | ORAL | 0 refills | Status: DC | PRN

## 2017-11-22 MED ORDER — PANTOPRAZOLE SODIUM 40 MG PO TBEC: 40.0000 mg | DELAYED_RELEASE_TABLET | Freq: Every day | ORAL | 3 refills | Status: DC
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
## 2017-11-22 NOTE — Patient Instructions (Addendum)
  PROTONIX/PANTOPRAZOLE 40MG  ONE TABLET DAILY ONE HOUR BEFORE A MEAL. ZANTAC/RANITIDINE 150MG  BEFORE LUNCH AND AT BEDTIME. TAKE VANCOMYCIN FOUR TIMES DAILY.   IF you received an x-ray today, you will receive an invoice from [facility]. Please contact [facility] at [phone] with questions or concerns regarding your invoice.   IF you received labwork today, you will receive an invoice from [facility]. Please contact [facility] at [phone] with questions or concerns regarding your invoice.   Our billing staff will not be able to assist you with questions regarding bills from these companies.  You will be contacted with the lab results as soon as they are available. The fastest way to get your results is to activate your My Chart account. Instructions are located on the last page of this paperwork. If you have not heard from [place] regarding the results in 2 weeks, please contact this office.

## 2017-11-23 LAB — STOOL CULTURE: E coli, Shiga toxin Assay: NEGATIVE

## 2017-11-23 LAB — COMPREHENSIVE METABOLIC PANEL
ALT: 9 IU/L (ref 0–44)
AST: 13 IU/L (ref 0–40)
Albumin/Globulin Ratio: 1.7 (ref 1.2–2.2)
Albumin: 4.5 g/dL (ref 3.5–5.5)
Alkaline Phosphatase: 71 IU/L (ref 39–117)
BUN/Creatinine Ratio: 16 (ref 9–20)
BUN: 17 mg/dL (ref 6–24)
Bilirubin Total: 0.6 mg/dL (ref 0.0–1.2)
CO2: 21 mmol/L (ref 20–29)
Calcium: 9.4 mg/dL (ref 8.7–10.2)
Chloride: 96 mmol/L (ref 96–106)
Creatinine, Ser: 1.09 mg/dL (ref 0.76–1.27)
GFR calc Af Amer: 86 mL/min/{1.73_m2} (ref >59)
GFR calc non Af Amer: 74 mL/min/{1.73_m2} (ref >59)
Globulin, Total: 2.6 g/dL (ref 1.5–4.5)
Glucose: 91 mg/dL (ref 65–99)
Potassium: 4.7 mmol/L (ref 3.5–5.2)
Sodium: 136 mmol/L (ref 134–144)
Total Protein: 7.1 g/dL (ref 6.0–8.5)

## 2017-11-23 LAB — CBC WITH DIFFERENTIAL/PLATELET
Basophils Absolute: 0 10*3/uL (ref 0.0–0.2)
Basos: 0 %
EOS (ABSOLUTE): 0.1 10*3/uL (ref 0.0–0.4)
Eos: 2 %
Hematocrit: 49.3 % (ref 37.5–51.0)
Hemoglobin: 17.5 g/dL (ref 13.0–17.7)
Immature Grans (Abs): 0 10*3/uL (ref 0.0–0.1)
Immature Granulocytes: 0 %
Lymphocytes Absolute: 2.1 10*3/uL (ref 0.7–3.1)
Lymphs: 29 %
MCH: 33.5 pg — ABNORMAL HIGH (ref 26.6–33.0)
MCHC: 35.5 g/dL (ref 31.5–35.7)
MCV: 94 fL (ref 79–97)
Monocytes Absolute: 0.4 10*3/uL (ref 0.1–0.9)
Monocytes: 6 %
Neutrophils Absolute: 4.4 10*3/uL (ref 1.4–7.0)
Neutrophils: 63 %
Platelets: 252 10*3/uL (ref 150–379)
RBC: 5.23 x10E6/uL (ref 4.14–5.80)
RDW: 13.3 % (ref 12.3–15.4)
WBC: 7 10*3/uL (ref 3.4–10.8)

## 2017-11-23 LAB — AMYLASE: Amylase: 34 U/L (ref 31–124)

## 2017-11-23 LAB — LIPASE: Lipase: 14 U/L (ref 13–78)

## 2017-11-23 LAB — OVA AND PARASITE EXAMINATION

## 2017-11-24 ENCOUNTER — Other Ambulatory Visit: Payer: Self-pay

## 2017-11-24 ENCOUNTER — Encounter: Payer: Self-pay | Admitting: Family Medicine

## 2017-11-24 ENCOUNTER — Ambulatory Visit: Payer: BLUE CROSS/BLUE SHIELD | Admitting: Family Medicine

## 2017-11-24 VITALS — BP 108/70 | HR 89 | Temp 98.0°F | Resp 16 | Wt 186.6 lb

## 2017-11-24 DIAGNOSIS — A0472 Enterocolitis due to Clostridium difficile, not specified as recurrent: Secondary | ICD-10-CM

## 2017-11-24 DIAGNOSIS — I25111 Atherosclerotic heart disease of native coronary artery with angina pectoris with documented spasm: Secondary | ICD-10-CM

## 2017-11-24 DIAGNOSIS — R1115 Cyclical vomiting syndrome unrelated to migraine: Secondary | ICD-10-CM

## 2017-11-24 DIAGNOSIS — F411 Generalized anxiety disorder: Secondary | ICD-10-CM

## 2017-11-24 DIAGNOSIS — T402X5A Adverse effect of other opioids, initial encounter: Secondary | ICD-10-CM

## 2017-11-24 DIAGNOSIS — M545 Low back pain, unspecified: Secondary | ICD-10-CM

## 2017-11-24 DIAGNOSIS — G8929 Other chronic pain: Secondary | ICD-10-CM

## 2017-11-24 DIAGNOSIS — I5042 Chronic combined systolic (congestive) and diastolic (congestive) heart failure: Secondary | ICD-10-CM

## 2017-11-24 DIAGNOSIS — K5903 Drug induced constipation: Secondary | ICD-10-CM | POA: Diagnosis not present

## 2017-11-24 DIAGNOSIS — G43A0 Cyclical vomiting, not intractable: Secondary | ICD-10-CM

## 2017-11-24 LAB — POCT URINALYSIS DIP (MANUAL ENTRY)
Bilirubin, UA: NEGATIVE
Glucose, UA: NEGATIVE mg/dL
Ketones, POC UA: NEGATIVE mg/dL
Leukocytes, UA: NEGATIVE
Nitrite, UA: NEGATIVE
Protein Ur, POC: NEGATIVE mg/dL
Spec Grav, UA: 1.02 (ref 1.010–1.025)
Urobilinogen, UA: 0.2 E.U./dL
pH, UA: 6 (ref 5.0–8.0)

## 2017-11-24 NOTE — Progress Notes (Signed)
Subjective:    Patient ID: [name], male    DOB: 07-Mar-1961, 57 y.o.   MRN: [id number]  11/24/2017  Enteritis (follow-up from 5/13) and Dizziness    HPI This 57 y.o. male presents for 48 hour follow-up of gastroenteritis.  Had to stay at the [facility] last night. Had a large bowel movement.   Vomiting has been better.   No vomiting in last 24 hours.  Didn't sleep well last night due to being in hotel; did not fall asleep until 4:00am.   Drove son to psychology.   Lactulose started working this morning after taking additional doses.   Incredibly weak and dizzy.  Not getting better.  Drinking water; ginger ale, sprite.  Apple juice. No food today.  Went back home today.   Today there is no coordibation; walking is off.  Can walk 10-15 walk but will fall.   No numbness or tingling.  Having a lot of pain in lower back; no loner going to pain clinic. No fever/chills/sweats. Last vomiting 30 hours ago.   Burping is better. Never started Pantoprazole.   Wife still in house.  Vancomycin four times daily.  Has a Automotive engineer.    Immunization History  Administered Date(s) Administered  . Influenza Split 04/12/2013, 09/11/2015  . Influenza Whole 05/25/2012  . Influenza,inj,Quad PF,6+ Mos 06/11/2014, 09/25/2015, 05/11/2016, 07/01/2016, 06/14/2017  . Influenza-Unspecified 05/25/2012, 04/20/2013, 03/14/2017  . Pneumococcal Conjugate-13 06/11/2014  . Pneumococcal Polysaccharide-23 07/13/2013, 08/10/2013  . Td 08/10/2013    Review of Systems  Constitutional: Negative for activity change, appetite change, chills, diaphoresis, fatigue, fever and unexpected weight change.  HENT: Negative for congestion, dental problem, drooling, ear discharge, ear pain, facial swelling, hearing loss, mouth sores, nosebleeds, postnasal drip, rhinorrhea, sinus pressure, sneezing, sore throat, tinnitus, trouble swallowing and voice change.   Eyes: Negative for photophobia, pain, discharge,  redness, itching and visual disturbance.  Respiratory: Negative for apnea, cough, choking, chest tightness, shortness of breath, wheezing and stridor.   Cardiovascular: Negative for chest pain, palpitations and leg swelling.  Gastrointestinal: Positive for nausea. Negative for abdominal distention, abdominal pain, anal bleeding, blood in stool, constipation, diarrhea and vomiting.  Endocrine: Negative for cold intolerance, heat intolerance, polydipsia, polyphagia and polyuria.  Genitourinary: Negative for decreased urine volume, difficulty urinating, discharge, dysuria, enuresis, flank pain, frequency, genital sores, hematuria, penile pain, penile swelling, scrotal swelling, testicular pain and urgency.  Musculoskeletal: Positive for back pain. Negative for arthralgias, gait problem, joint swelling, myalgias, neck pain and neck stiffness.  Skin: Negative for color change, pallor, rash and wound.  Allergic/Immunologic: Negative for environmental allergies, food allergies and immunocompromised state.  Neurological: Negative for dizziness, tremors, seizures, syncope, facial asymmetry, speech difficulty, weakness, light-headedness, numbness and headaches.  Hematological: Negative for adenopathy. Does not bruise/bleed easily.  Psychiatric/Behavioral: Positive for sleep disturbance. Negative for agitation, behavioral problems, confusion, decreased concentration, dysphoric mood, hallucinations, self-injury and suicidal ideas. The patient is nervous/anxious. The patient is not hyperactive.     Past Medical History:  Diagnosis Date  . Anxiety   . Automatic implantable cardiac defibrillator [employer]    Analyze ST  . Benign prostatic hypertrophy   . Benzodiazepine dependence (HCC)    chronic  . CAD (coronary artery disease)    Last cath 2/12. 3-v CAD. Failed PCI of distal RCAc  CATH [facility] 4/13 with DES to  LAD X2  . CHF (congestive heart failure) ([place])   . Chronic back pain    lumbar stenosis  .  Chronic systolic heart failure (HCC)    EF 20-25%. s/p ST. Jude ICD  . Depression   . DJD (degenerative joint disease)   . History of testicular cancer   . Narcotic dependence (HCC)    chronic  . Vitamin B12 deficiency 07/22/2016   Past Surgical History:  Procedure Laterality Date  . ASD REPAIR, SINUS VENOSUS    . CARDIAC DEFIBRILLATOR PLACEMENT  08/2010  . HERNIA REPAIR    . LEFT HEART CATHETERIZATION WITH CORONARY ANGIOGRAM N/A 06/14/2014   Procedure: LEFT HEART CATHETERIZATION WITH CORONARY ANGIOGRAM;  Surgeon: Jolaine Artist, MD;  Location: [facility] CATH LAB;  Service: Cardiovascular;  Laterality: N/A;  . TESTICLE SURGERY     testicular cancer surgery   Allergies  Allergen Reactions  . Darvocet [Propoxyphene N-Acetaminophen] Anaphylaxis    Throat closes  . Propoxyphene Anaphylaxis and Swelling   Current Outpatient Medications on File Prior to Visit  Medication Sig Dispense Refill  . albuterol (VENTOLIN HFA) 108 (90 Base) MCG/ACT inhaler Inhale 2 puffs into the lungs every 6 (six) hours as needed for wheezing or shortness of breath. 18 g 0  . alfuzosin (UROXATRAL) 10 MG 24 hr tablet Take 10 mg by mouth as needed. For UTI    . ALPRAZolam (XANAX) 1 MG tablet Take by mouth.    Marland Kitchen aspirin 325 MG tablet Take 325 mg by mouth daily.    . carvedilol (COREG) 25 MG tablet TAKE 1 TABLET TWICE DAILY WITH A MEAL. 180 tablet 3  . diazepam (VALIUM) 10 MG tablet   2  . ezetimibe (ZETIA) 10 MG tablet TAKE 1 TABLET EACH DAY. 90 tablet 3  . lisinopril (PRINIVIL,ZESTRIL) 5 MG tablet Take 1 tablet (5 mg total) by mouth 2 (two) times daily. 180 tablet 0  . LORazepam (ATIVAN) 1 MG tablet Take 1 mg by mouth 4 (four) times daily as needed for anxiety or sleep. For anxiety    . morphine (MSIR) 30 MG tablet Take 1 tablet (30 mg total) by mouth every 6 (six) hours as needed for severe pain. 120 tablet 0  . mupirocin ointment (BACTROBAN) 2 % Apply 1 application topically 2 (two) times daily. 22 g 0  . naproxen  (NAPROSYN) 500 MG tablet Take 1 tablet (500 mg total) by mouth 2 (two) times daily. 30 tablet 0  . nitroGLYCERIN (NITROLINGUAL) 0.4 MG/SPRAY spray Place under the tongue.    . ranitidine (ZANTAC) 150 MG tablet Take 1 tablet (150 mg total) by mouth 2 (two) times daily. 60 tablet 0  . rosuvastatin (CRESTOR) 40 MG tablet TAKE 1 TABLET DAILY. 90 tablet 3  . sildenafil (REVATIO) 20 MG tablet Take by mouth.    . tamsulosin (FLOMAX) 0.4 MG CAPS capsule Take 1 capsule (0.4 mg total) by mouth daily after breakfast. 90 capsule 0  . testosterone cypionate (DEPOTESTOTERONE CYPIONATE) 200 MG/ML injection Inject into the muscle every 14 (fourteen) days.      Marland Kitchen torsemide (DEMADEX) 20 MG tablet Take 1 tablet (20 mg total) by mouth daily as needed. For swelling in legs 90 tablet 2  . traZODone (DESYREL) 100 MG tablet Take 100 mg by mouth at bedtime as needed for sleep.     Marland Kitchen triamcinolone (NASACORT AQ) 55 MCG/ACT AERO nasal inhaler Place 1 spray into the nose daily. 1 Inhaler 12  . VIAGRA 100 MG tablet TAKE 1 TABLET APPROXIMATELY 1 HOUR BEFORE NEEDING AS DIRECTED. 4 tablet 0  . zolpidem (AMBIEN) 10 MG tablet Take 1 tablet by mouth at  bedtime as needed for sleep.   4  . polyethylene glycol powder (GLYCOLAX/MIRALAX) powder MIX 1 CAPFUL TWICE DAILY AS NEEDED. 250 g 0   Current Facility-Administered Medications on File Prior to Visit  Medication Dose Route Frequency Provider Last Rate Last Dose  . levalbuterol (XOPENEX) nebulizer solution 0.63 mg  0.63 mg Nebulization Once Wardell Honour, MD       Social History   Socioeconomic History  . Marital status: Married    Spouse name: Not on file  . Number of children: 3  . Years of education: Not on file  . Highest education level: Not on file  Occupational History  . Not on file  Social Needs  . Financial resource strain: Not on file  . Food insecurity:    Worry: Not on file    Inability: Not on file  . Transportation needs:    Medical: Not on file     Non-medical: Not on file  Tobacco Use  . Smoking status: Current Every Day Smoker    Packs/day: 0.50    Years: 29.00    Pack years: 14.50    Types: Cigarettes, E-cigarettes    Last attempt to quit: 07/14/2015    Years since quitting: 2.5  . Smokeless tobacco: Never Used  Substance and Sexual Activity  . Alcohol use: No    Alcohol/week: 0.0 oz  . Drug use: Yes    Types: Marijuana    Comment: Urine showed THC  . Sexual activity: Not on file  Lifestyle  . Physical activity:    Days per week: Not on file    Minutes per session: Not on file  . Stress: Not on file  Relationships  . Social connections:    Talks on phone: Not on file    Gets together: Not on file    Attends religious service: Not on file    Active member of club or organization: Not on file    Attends meetings of clubs or organizations: Not on file    Relationship status: Not on file  . Intimate partner violence:    Fear of current or ex partner: Not on file    Emotionally abused: Not on file    Physically abused: Not on file    Forced sexual activity: Not on file  Other Topics Concern  . Not on file  Social History Narrative   Marital status: married x 12 years; wife is severe alcoholic.      Children: 3 married daughters (23, 60, 16); 1 son (49yo); 3 grandchildren born in 2017      Lives: with wife, son      Left-handed   Caffeine: 3 drinks per day   Family History  Problem Relation Age of Onset  . Heart disease Father 25       Died of MI  . Cancer Mother        Objective:    BP 108/70   Pulse 89   Temp 98 F (36.7 C) (Oral)   Resp 16   Wt 186 lb 9.6 oz (84.6 kg)   SpO2 98%   BMI 25.84 kg/m  Physical Exam  Constitutional: He is oriented to person, place, and time. He appears well-developed and well-nourished. No distress.  HENT:  Head: Normocephalic and atraumatic.  Right Ear: External ear normal.  Left Ear: External ear normal.  Nose: Nose normal.  Mouth/Throat: Oropharynx is clear and  moist. No oropharyngeal exudate.  Eyes: Pupils are equal, round, and reactive  to light. Conjunctivae and EOM are normal.  Neck: Normal range of motion. Neck supple. Carotid bruit is not present. No thyromegaly present.  Cardiovascular: Normal rate, regular rhythm, normal heart sounds and intact distal pulses. Exam reveals no gallop and no friction rub.  No murmur heard. Pulmonary/Chest: Effort normal and breath sounds normal. He has no wheezes. He has no rales.  Abdominal: Soft. Bowel sounds are normal. He exhibits no distension and no mass. There is no tenderness. There is no rebound and no guarding.  Musculoskeletal:       Right shoulder: Normal.       Left shoulder: Normal.       Cervical back: Normal.  Lymphadenopathy:    He has no cervical adenopathy.  Neurological: He is alert and oriented to person, place, and time. He has normal reflexes. No cranial nerve deficit. He exhibits normal muscle tone. Coordination normal.  Skin: Skin is warm and dry. No rash noted. He is not diaphoretic.  Psychiatric: He has a normal mood and affect. His behavior is normal. Judgment and thought content normal.  Nursing note and vitals reviewed.       Assessment & Plan:   1. Enteritis due to Clostridium difficile   2. Atherosclerosis of native coronary artery of native heart with angina pectoris with documented spasm (HCC)   3. COMBINED HEART FAILURE, CHRONIC   4. Therapeutic opioid induced constipation   5. Generalized anxiety disorder   6. Chronic bilateral low back pain without sciatica   7. Drug-induced constipation   8. Non-intractable cyclical vomiting with nausea     C. difficile colitis: Improving.  No recurrent diarrhea.  Complete vancomycin orally.  Chronic constipation secondary to chronic opiate use: Improvement with lactulose therapy.  Nausea with vomiting: Patient reports stress induced.  Continue Protonix therapy and Zofran as needed.  Awaiting CT abdomen and pelvis.  Awaiting  appointment with gastroenterology.  To emergency department if recurrent.  Recommend brat diet and hydration.  Benign abdominal exam in office.  Coronary artery disease with CHF: Euvolemic in office today.  EKG obtained today in stable.  Chronic lower back pain: No longer followed by pain management.  Maintained on methadone therapy.  Orders Placed This Encounter  Procedures  . CBC with Differential/Platelet  . Comprehensive metabolic panel  . Orthostatic vital signs  . POCT urinalysis dipstick  . EKG 12-Lead   No orders of the defined types were placed in this encounter.   No follow-ups on file.   Mette Southgate Elayne Guerin, M.D. Primary Care at [facility] [address] [place], [place]  [address] [phone] phone [phone] fax

## 2017-11-24 NOTE — Patient Instructions (Addendum)
  Increase water intake. Present to the emergency room for worsening symptoms. Avoid driving for the next 72 hours.   IF you received an x-ray today, you will receive an invoice from [facility]. Please contact [facility] at [phone] with questions or concerns regarding your invoice.   IF you received labwork today, you will receive an invoice from [place]. Please contact [facility] at [phone] with questions or concerns regarding your invoice.   Our billing staff will not be able to assist you with questions regarding bills from these companies.  You will be contacted with the lab results as soon as they are available. The fastest way to get your results is to activate your My Chart account. Instructions are located on the last page of this paperwork. If you have not heard from [place] regarding the results in 2 weeks, please contact this office.    \

## 2017-11-25 LAB — CBC WITH DIFFERENTIAL/PLATELET
Basophils Absolute: 0 10*3/uL (ref 0.0–0.2)
Basos: 0 %
EOS (ABSOLUTE): 0.3 10*3/uL (ref 0.0–0.4)
Eos: 4 %
Hematocrit: 50.3 % (ref 37.5–51.0)
Hemoglobin: 17.3 g/dL (ref 13.0–17.7)
Immature Grans (Abs): 0 10*3/uL (ref 0.0–0.1)
Immature Granulocytes: 1 %
Lymphocytes Absolute: 2.7 10*3/uL (ref 0.7–3.1)
Lymphs: 35 %
MCH: 33.1 pg — ABNORMAL HIGH (ref 26.6–33.0)
MCHC: 34.4 g/dL (ref 31.5–35.7)
MCV: 96 fL (ref 79–97)
Monocytes Absolute: 0.7 10*3/uL (ref 0.1–0.9)
Monocytes: 9 %
Neutrophils Absolute: 4 10*3/uL (ref 1.4–7.0)
Neutrophils: 51 %
Platelets: 220 10*3/uL (ref 150–379)
RBC: 5.23 x10E6/uL (ref 4.14–5.80)
RDW: 13.7 % (ref 12.3–15.4)
WBC: 7.8 10*3/uL (ref 3.4–10.8)

## 2017-11-25 LAB — COMPREHENSIVE METABOLIC PANEL
ALT: 12 IU/L (ref 0–44)
AST: 20 IU/L (ref 0–40)
Albumin/Globulin Ratio: 1.6 (ref 1.2–2.2)
Albumin: 4.6 g/dL (ref 3.5–5.5)
Alkaline Phosphatase: 77 IU/L (ref 39–117)
BUN/Creatinine Ratio: 12 (ref 9–20)
BUN: 13 mg/dL (ref 6–24)
Bilirubin Total: 0.4 mg/dL (ref 0.0–1.2)
CO2: 23 mmol/L (ref 20–29)
Calcium: 9.7 mg/dL (ref 8.7–10.2)
Chloride: 95 mmol/L — ABNORMAL LOW (ref 96–106)
Creatinine, Ser: 1.07 mg/dL (ref 0.76–1.27)
GFR calc Af Amer: 88 mL/min/{1.73_m2} (ref >59)
GFR calc non Af Amer: 76 mL/min/{1.73_m2} (ref >59)
Globulin, Total: 2.8 g/dL (ref 1.5–4.5)
Glucose: 91 mg/dL (ref 65–99)
Potassium: 5.3 mmol/L — ABNORMAL HIGH (ref 3.5–5.2)
Sodium: 136 mmol/L (ref 134–144)
Total Protein: 7.4 g/dL (ref 6.0–8.5)

## 2017-11-29 ENCOUNTER — Ambulatory Visit: Payer: BLUE CROSS/BLUE SHIELD | Admitting: Family Medicine

## 2017-11-29 ENCOUNTER — Other Ambulatory Visit: Payer: Self-pay

## 2017-11-29 ENCOUNTER — Encounter: Payer: Self-pay | Admitting: Family Medicine

## 2017-11-29 VITALS — BP 102/68 | HR 76 | Temp 98.0°F | Resp 16 | Wt 192.0 lb

## 2017-11-29 DIAGNOSIS — F411 Generalized anxiety disorder: Secondary | ICD-10-CM

## 2017-11-29 DIAGNOSIS — G43A1 Cyclical vomiting, intractable: Secondary | ICD-10-CM | POA: Diagnosis not present

## 2017-11-29 DIAGNOSIS — A0472 Enterocolitis due to Clostridium difficile, not specified as recurrent: Secondary | ICD-10-CM

## 2017-11-29 DIAGNOSIS — F5089 Other specified eating disorder: Secondary | ICD-10-CM | POA: Diagnosis not present

## 2017-11-29 DIAGNOSIS — I5042 Chronic combined systolic (congestive) and diastolic (congestive) heart failure: Secondary | ICD-10-CM

## 2017-11-29 DIAGNOSIS — K5903 Drug induced constipation: Secondary | ICD-10-CM

## 2017-11-29 DIAGNOSIS — R1115 Cyclical vomiting syndrome unrelated to migraine: Secondary | ICD-10-CM

## 2017-11-29 DIAGNOSIS — I2510 Atherosclerotic heart disease of native coronary artery without angina pectoris: Secondary | ICD-10-CM

## 2017-11-29 DIAGNOSIS — T402X5A Adverse effect of other opioids, initial encounter: Secondary | ICD-10-CM

## 2017-11-29 MED ORDER — ONDANSETRON 8 MG PO TBDP: 8.0000 mg | ORAL_TABLET | Freq: Three times a day (TID) | ORAL | 1 refills | Status: DC | PRN

## 2017-11-29 MED ORDER — LACTULOSE 10 GM/15ML PO SOLN: 10.0000 g | Freq: Two times a day (BID) | ORAL | 0 refills | Status: DC | PRN

## 2017-11-29 NOTE — Patient Instructions (Addendum)
     IF you received an x-ray today, you will receive an invoice from [facility]. Please contact [facility] at [phone] with questions or concerns regarding your invoice.   IF you received labwork today, you will receive an invoice from [place]. Please contact [facility] at [phone] with questions or concerns regarding your invoice.   Our billing staff will not be able to assist you with questions regarding bills from these companies.  You will be contacted with the lab results as soon as they are available. The fastest way to get your results is to activate your My Chart account. Instructions are located on the last page of this paperwork. If you have not heard from [place] regarding the results in 2 weeks, please contact this office.      Clostridium Difficile Infection Clostridium difficile (C. difficile or C. diff) infection causes inflammation of the large intestine (colon). This condition can result in damage to the lining of your colon and may lead to another condition called colitis. This infection can be passed from person to person (is contagious). Follow these instructions at home: Eating and drinking  Drink enough fluid to keep your pee (urine) clear or pale yellow.  Avoid drinking: ? Milk. ? Caffeine. ? Alcohol.  Follow exact instructions from your doctor about how to get enough fluid in your body (rehydrate).  Eat small meals often instead of large meals. Medicines  Take your antibiotic medicine as told by your doctor. Do not stop taking the antibiotic even if you start to feel better unless your doctor told you to do that.  Take over-the-counter and prescription medicines only as told by your doctor.  Do not use medicines to help with watery poop (diarrhea). General instructions  Wash your hands fully before you prepare food and after you use the bathroom. Make sure people who live with you also wash their  hands often.  Clean the  surfaces that you touch. Use a product that contains chlorine bleach.  Keep all follow-up visits as told by your doctor. This is important. Contact a doctor if:  Your symptoms do not get better with treatment.  Your symptoms get worse with treatment.  Your symptoms go away and then come back.  You have a fever.  You have new symptoms. Get help right away if:  You have more pain or tenderness in your belly (abdomen).  Your poop (stool) is mostly bloody.  Your poop looks dark black and tarry.  You cannot eat or drink without throwing up (vomiting).  You have signs of dehydration, such as: ? Dark pee, very little pee, or no pee. ? Cracked lips. ? Not making tears when you cry. ? Dry mouth. ? Sunken eyes. ? Feeling sleepy. ? Feeling weak. ? Feeling dizzy. This information is not intended to replace advice given to you by your health care provider. Make sure you discuss any questions you have with your health care provider. Document Released: 04/26/2009 Document Revised: 12/05/2015 Document Reviewed: 12/31/2014 Elsevier Interactive Patient Education  2017 [employer].

## 2017-11-29 NOTE — Progress Notes (Signed)
Subjective:    Patient ID: [name], male    DOB: 02/28/1961, 57 y.o.   MRN: [id number]  11/29/2017  Enteritis (follow-up fron 5/15 pt states he has some consipation )    HPI This 57 y.o. male presents for five day follow-up of C. Difficile colitis, nausea with vomiting.  Weight is increased six pounds from visit five days ago.  DSS evaluation.  Pt has full custody.  Cannot be around son. Feeling better. Eating chicken broth and toast. Six ounces of food.  Drank an ensure.   Keeping self hydrated.  Drinking 32 ounces per day. Vomited last one week ago.   No persistent diarrhea. Heaviest thing has eaten is a pizza.   Lactulose is slow; taking 1 tablespoon twice daily.   Linzess is too heavy; causes incontinence. Lactulose looks good.   Just started Protonix 40mg  one daily.     Immunization History  Administered Date(s) Administered  . Influenza Split 04/12/2013, 09/11/2015  . Influenza Whole 05/25/2012  . Influenza,inj,Quad PF,6+ Mos 06/11/2014, 09/25/2015, 05/11/2016, 07/01/2016, 06/14/2017  . Influenza-Unspecified 05/25/2012, 04/20/2013, 03/14/2017  . Pneumococcal Conjugate-13 06/11/2014  . Pneumococcal Polysaccharide-23 07/13/2013, 08/10/2013  . Td 08/10/2013    Review of Systems  Constitutional: Negative for activity change, appetite change, chills, diaphoresis, fatigue, fever and unexpected weight change.  HENT: Negative for congestion, dental problem, drooling, ear discharge, ear pain, facial swelling, hearing loss, mouth sores, nosebleeds, postnasal drip, rhinorrhea, sinus pressure, sneezing, sore throat, tinnitus, trouble swallowing and voice change.   Eyes: Negative for photophobia, pain, discharge, redness, itching and visual disturbance.  Respiratory: Negative for apnea, cough, choking, chest tightness, shortness of breath, wheezing and stridor.   Cardiovascular: Negative for chest pain, palpitations and leg swelling.  Gastrointestinal: Positive for  constipation and nausea. Negative for abdominal distention, abdominal pain, anal bleeding, blood in stool, diarrhea, rectal pain and vomiting.  Endocrine: Negative for cold intolerance, heat intolerance, polydipsia, polyphagia and polyuria.  Genitourinary: Negative for decreased urine volume, difficulty urinating, discharge, dysuria, enuresis, flank pain, frequency, genital sores, hematuria, penile pain, penile swelling, scrotal swelling, testicular pain and urgency.  Musculoskeletal: Negative for arthralgias, back pain, gait problem, joint swelling, myalgias, neck pain and neck stiffness.  Skin: Negative for color change, pallor, rash and wound.  Allergic/Immunologic: Negative for environmental allergies, food allergies and immunocompromised state.  Neurological: Negative for dizziness, tremors, seizures, syncope, facial asymmetry, speech difficulty, weakness, light-headedness, numbness and headaches.  Hematological: Negative for adenopathy. Does not bruise/bleed easily.  Psychiatric/Behavioral: Negative for agitation, behavioral problems, confusion, decreased concentration, dysphoric mood, hallucinations, self-injury, sleep disturbance and suicidal ideas. The patient is not nervous/anxious and is not hyperactive.     Past Medical History:  Diagnosis Date  . Anxiety   . Automatic implantable cardiac defibrillator [employer]    Analyze ST  . Benign prostatic hypertrophy   . Benzodiazepine dependence (HCC)    chronic  . CAD (coronary artery disease)    Last cath 2/12. 3-v CAD. Failed PCI of distal RCAc  CATH [facility] 4/13 with DES to  LAD X2  . CHF (congestive heart failure) ([place])   . Chronic back pain    lumbar stenosis  . Chronic systolic heart failure (HCC)    EF 20-25%. s/p ST. Jude ICD  . Depression   . DJD (degenerative joint disease)   . History of testicular cancer   . Narcotic dependence (HCC)    chronic  . Vitamin B12 deficiency 07/22/2016   Past Surgical History:  Procedure  Laterality Date  . ASD REPAIR, SINUS VENOSUS    . CARDIAC DEFIBRILLATOR PLACEMENT  08/2010  . HERNIA REPAIR    . LEFT HEART CATHETERIZATION WITH CORONARY ANGIOGRAM N/A 06/14/2014   Procedure: LEFT HEART CATHETERIZATION WITH CORONARY ANGIOGRAM;  Surgeon: Jolaine Artist, MD;  Location: [facility] CATH LAB;  Service: Cardiovascular;  Laterality: N/A;  . TESTICLE SURGERY     testicular cancer surgery   Allergies  Allergen Reactions  . Darvocet [Propoxyphene N-Acetaminophen] Anaphylaxis    Throat closes  . Propoxyphene Anaphylaxis and Swelling   Current Outpatient Medications on File Prior to Visit  Medication Sig Dispense Refill  . albuterol (VENTOLIN HFA) 108 (90 Base) MCG/ACT inhaler Inhale 2 puffs into the lungs every 6 (six) hours as needed for wheezing or shortness of breath. 18 g 0  . alfuzosin (UROXATRAL) 10 MG 24 hr tablet Take 10 mg by mouth as needed. For UTI    . ALPRAZolam (XANAX) 1 MG tablet Take by mouth.    Marland Kitchen aspirin 325 MG tablet Take 325 mg by mouth daily.    . carvedilol (COREG) 25 MG tablet TAKE 1 TABLET TWICE DAILY WITH A MEAL. 180 tablet 3  . diazepam (VALIUM) 10 MG tablet   2  . ezetimibe (ZETIA) 10 MG tablet TAKE 1 TABLET EACH DAY. 90 tablet 3  . lisinopril (PRINIVIL,ZESTRIL) 5 MG tablet Take 1 tablet (5 mg total) by mouth 2 (two) times daily. 180 tablet 0  . LORazepam (ATIVAN) 1 MG tablet Take 1 mg by mouth 4 (four) times daily as needed for anxiety or sleep. For anxiety    . morphine (MSIR) 30 MG tablet Take 1 tablet (30 mg total) by mouth every 6 (six) hours as needed for severe pain. 120 tablet 0  . nitroGLYCERIN (NITROLINGUAL) 0.4 MG/SPRAY spray Place under the tongue.    . polyethylene glycol powder (GLYCOLAX/MIRALAX) powder MIX 1 CAPFUL TWICE DAILY AS NEEDED. 250 g 0  . rosuvastatin (CRESTOR) 40 MG tablet TAKE 1 TABLET DAILY. 90 tablet 3  . sildenafil (REVATIO) 20 MG tablet Take by mouth.    . tamsulosin (FLOMAX) 0.4 MG CAPS capsule Take 1 capsule (0.4 mg total) by  mouth daily after breakfast. 90 capsule 0  . testosterone cypionate (DEPOTESTOTERONE CYPIONATE) 200 MG/ML injection Inject into the muscle every 14 (fourteen) days.      Marland Kitchen torsemide (DEMADEX) 20 MG tablet Take 1 tablet (20 mg total) by mouth daily as needed. For swelling in legs 90 tablet 2  . traZODone (DESYREL) 100 MG tablet Take 100 mg by mouth at bedtime as needed for sleep.     Marland Kitchen triamcinolone (NASACORT AQ) 55 MCG/ACT AERO nasal inhaler Place 1 spray into the nose daily. 1 Inhaler 12  . VIAGRA 100 MG tablet TAKE 1 TABLET APPROXIMATELY 1 HOUR BEFORE NEEDING AS DIRECTED. 4 tablet 0  . zolpidem (AMBIEN) 10 MG tablet Take 1 tablet by mouth at bedtime as needed for sleep.   4   No current facility-administered medications on file prior to visit.    Social History   Socioeconomic History  . Marital status: Married    Spouse name: Not on file  . Number of children: 3  . Years of education: Not on file  . Highest education level: Not on file  Occupational History  . Not on file  Social Needs  . Financial resource strain: Not on file  . Food insecurity:    Worry: Not on file    Inability: Not on  file  . Transportation needs:    Medical: Not on file    Non-medical: Not on file  Tobacco Use  . Smoking status: Current Every Day Smoker    Packs/day: 0.50    Years: 29.00    Pack years: 14.50    Types: Cigarettes, E-cigarettes    Last attempt to quit: 07/14/2015    Years since quitting: 2.5  . Smokeless tobacco: Never Used  Substance and Sexual Activity  . Alcohol use: No    Alcohol/week: 0.0 oz  . Drug use: Yes    Types: Marijuana    Comment: Urine showed THC  . Sexual activity: Not on file  Lifestyle  . Physical activity:    Days per week: Not on file    Minutes per session: Not on file  . Stress: Not on file  Relationships  . Social connections:    Talks on phone: Not on file    Gets together: Not on file    Attends religious service: Not on file    Active member of club  or organization: Not on file    Attends meetings of clubs or organizations: Not on file    Relationship status: Not on file  . Intimate partner violence:    Fear of current or ex partner: Not on file    Emotionally abused: Not on file    Physically abused: Not on file    Forced sexual activity: Not on file  Other Topics Concern  . Not on file  Social History Narrative   Marital status: married x 12 years; wife is severe alcoholic.      Children: 3 married daughters (23, 43, 14); 1 son (46yo); 3 grandchildren born in 2017      Lives: with wife, son      Left-handed   Caffeine: 3 drinks per day   Family History  Problem Relation Age of Onset  . Heart disease Father 65       Died of MI  . Cancer Mother        Objective:    BP 102/68   Pulse 76   Temp 98 F (36.7 C) (Oral)   Resp 16   Wt 192 lb (87.1 kg)   SpO2 95%   BMI 26.58 kg/m  Physical Exam  Constitutional: He is oriented to person, place, and time. He appears well-developed and well-nourished. No distress.  HENT:  Head: Normocephalic and atraumatic.  Right Ear: External ear normal.  Left Ear: External ear normal.  Nose: Nose normal.  Mouth/Throat: Oropharynx is clear and moist.  Eyes: Pupils are equal, round, and reactive to light. Conjunctivae and EOM are normal.  Neck: Normal range of motion. Neck supple. Carotid bruit is not present. No thyromegaly present.  Cardiovascular: Normal rate, regular rhythm, normal heart sounds and intact distal pulses. Exam reveals no gallop and no friction rub.  No murmur heard. Pulmonary/Chest: Effort normal and breath sounds normal. He has no wheezes. He has no rales.  Abdominal: Soft. Bowel sounds are normal. He exhibits no distension and no mass. There is no tenderness. There is no rebound and no guarding.  Musculoskeletal:       Right shoulder: Normal.       Left shoulder: Normal.       Cervical back: Normal.  Lymphadenopathy:    He has no cervical adenopathy.    Neurological: He is alert and oriented to person, place, and time. He has normal reflexes. No cranial nerve deficit. He exhibits  normal muscle tone. Coordination normal.  Skin: Skin is warm and dry. No rash noted. He is not diaphoretic.  Psychiatric: He has a normal mood and affect. His behavior is normal. Judgment and thought content normal.   No results found.      Assessment & Plan:   1. Psychogenic vomiting with nausea   2. Intractable cyclical vomiting with nausea   3. Enteritis due to Clostridium difficile   4. Therapeutic opioid induced constipation   5. Generalized anxiety disorder   6. Atherosclerosis of native coronary artery of native heart without angina pectoris   7. COMBINED HEART FAILURE, CHRONIC     C. difficile enteritis: Improving.  Status post actomyosin therapy.  Obtain CT of abdomen and pelvis due to intermittent nausea with vomiting.  Awaiting appointment with gastroenterology.  No ongoing diarrhea.  Persistent nausea with intermittent vomiting: Improving.  Weight has increased 6 pounds from last visit.  Patient is euvolemic and no evidence of volume overload.  Obtain CT abdomen and pelvis.  Await GI consultation.  Obtain labs.  Brat diet and hydration.  Drug-induced constipation: Chronic issue for patient and now his biggest issue.  Continue lactulose therapy as patient has responded the best over the years with lactulose.  Generalized anxiety disorder: With acute stressors recently.  Patient feels that anxiety is contributing to ongoing nausea with vomiting.  Encouraged psychotherapy and follow-up with psychiatry.  Orders Placed This Encounter  Procedures  . CT Abdomen Pelvis W Contrast    Wt 195/ no diabetic/ nk da to IV cm/ no kidney prob/ no dialysis/ oxygen/ ins bcbs/ sf    Standing Status:   Future    Number of Occurrences:   1    Standing Expiration Date:   03/02/2019    Order Specific Question:   If indicated for the ordered procedure, I authorize the  administration of contrast media per Radiology protocol    Answer:   Yes    Order Specific Question:   Preferred imaging location?    Answer:   [facility]    Order Specific Question:   Is Oral Contrast requested for this exam?    Answer:   Yes, Per Radiology protocol    Order Specific Question:   Radiology Contrast Protocol - do NOT remove file path    Answer:   \\[email or web address]  . CBC with Differential/Platelet  . Comprehensive metabolic panel   Meds ordered this encounter  Medications  . lactulose (CHRONULAC) 10 GM/15ML solution    Sig: Take 15 mLs (10 g total) by mouth 2 (two) times daily as needed for mild constipation.    Dispense:  1892 mL    Refill:  0  . DISCONTD: ondansetron (ZOFRAN-ODT) 8 MG disintegrating tablet    Sig: Take 1 tablet (8 mg total) by mouth every 8 (eight) hours as needed for nausea or vomiting.    Dispense:  20 tablet    Refill:  1    Return in about 2 weeks (around 12/13/2017).   Merve Hotard Elayne Guerin, M.D. Primary Care at [facility] previously [facility] [place] [address] [place], [place]  [address] [phone] phone [phone] fax

## 2017-11-30 ENCOUNTER — Encounter: Payer: Self-pay | Admitting: Family Medicine

## 2017-11-30 LAB — COMPREHENSIVE METABOLIC PANEL
ALT: 14 IU/L (ref 0–44)
AST: 18 IU/L (ref 0–40)
Albumin/Globulin Ratio: 1.9 (ref 1.2–2.2)
Albumin: 4.4 g/dL (ref 3.5–5.5)
Alkaline Phosphatase: 74 IU/L (ref 39–117)
BUN/Creatinine Ratio: 10 (ref 9–20)
BUN: 9 mg/dL (ref 6–24)
Bilirubin Total: 0.3 mg/dL (ref 0.0–1.2)
CO2: 27 mmol/L (ref 20–29)
Calcium: 9.6 mg/dL (ref 8.7–10.2)
Chloride: 92 mmol/L — ABNORMAL LOW (ref 96–106)
Creatinine, Ser: 0.89 mg/dL (ref 0.76–1.27)
GFR calc Af Amer: 109 mL/min/{1.73_m2} (ref >59)
GFR calc non Af Amer: 94 mL/min/{1.73_m2} (ref >59)
Globulin, Total: 2.3 g/dL (ref 1.5–4.5)
Glucose: 87 mg/dL (ref 65–99)
Potassium: 4.9 mmol/L (ref 3.5–5.2)
Sodium: 134 mmol/L (ref 134–144)
Total Protein: 6.7 g/dL (ref 6.0–8.5)

## 2017-11-30 LAB — CBC WITH DIFFERENTIAL/PLATELET
Basophils Absolute: 0 10*3/uL (ref 0.0–0.2)
Basos: 0 %
EOS (ABSOLUTE): 0.4 10*3/uL (ref 0.0–0.4)
Eos: 4 %
Hematocrit: 47.9 % (ref 37.5–51.0)
Hemoglobin: 16.5 g/dL (ref 13.0–17.7)
Immature Grans (Abs): 0 10*3/uL (ref 0.0–0.1)
Immature Granulocytes: 0 %
Lymphocytes Absolute: 2.2 10*3/uL (ref 0.7–3.1)
Lymphs: 22 %
MCH: 32.9 pg (ref 26.6–33.0)
MCHC: 34.4 g/dL (ref 31.5–35.7)
MCV: 96 fL (ref 79–97)
Monocytes Absolute: 0.4 10*3/uL (ref 0.1–0.9)
Monocytes: 4 %
Neutrophils Absolute: 7.1 10*3/uL — ABNORMAL HIGH (ref 1.4–7.0)
Neutrophils: 70 %
Platelets: 199 10*3/uL (ref 150–450)
RBC: 5.01 x10E6/uL (ref 4.14–5.80)
RDW: 13.7 % (ref 12.3–15.4)
WBC: 10.2 10*3/uL (ref 3.4–10.8)

## 2017-11-30 NOTE — Progress Notes (Unsigned)
Subjective:    Patient ID: [name], male    DOB: 1960-10-13, 57 y.o.   MRN: [id number]  11/30/2017  No chief complaint on file.    HPI This 57 y.o. male presents for evaluation of ***. BP Readings from Last 3 Encounters:  11/29/17 102/68  11/24/17 108/70  11/22/17 102/70   Wt Readings from Last 3 Encounters:  11/29/17 192 lb (87.1 kg)  11/24/17 186 lb 9.6 oz (84.6 kg)  11/22/17 187 lb (84.8 kg)   Immunization History  Administered Date(s) Administered  . Influenza Split 04/12/2013, 09/11/2015  . Influenza Whole 05/25/2012  . Influenza,inj,Quad PF,6+ Mos 06/11/2014, 09/25/2015, 05/11/2016, 07/01/2016, 06/14/2017  . Influenza-Unspecified 05/25/2012, 04/20/2013, 03/14/2017  . Pneumococcal Conjugate-13 06/11/2014  . Pneumococcal Polysaccharide-23 07/13/2013, 08/10/2013  . Td 08/10/2013    Review of Systems  Past Medical History:  Diagnosis Date  . Anxiety   . Automatic implantable cardiac defibrillator [facility]    Analyze ST  . Benign prostatic hypertrophy   . Benzodiazepine dependence (HCC)    chronic  . CAD (coronary artery disease)    Last cath 2/12. 3-v CAD. Failed PCI of distal RCAc  CATH [facility] 4/13 with DES to  LAD X2  . CHF (congestive heart failure) ([place])   . Chronic back pain    lumbar stenosis  . Chronic systolic heart failure (HCC)    EF 20-25%. s/p ST. Jude ICD  . Depression   . DJD (degenerative joint disease)   . History of testicular cancer   . Narcotic dependence (HCC)    chronic  . Vitamin B12 deficiency 07/22/2016   Past Surgical History:  Procedure Laterality Date  . ASD REPAIR, SINUS VENOSUS    . CARDIAC DEFIBRILLATOR PLACEMENT  08/2010  . HERNIA REPAIR    . LEFT HEART CATHETERIZATION WITH CORONARY ANGIOGRAM N/A 06/14/2014   Procedure: LEFT HEART CATHETERIZATION WITH CORONARY ANGIOGRAM;  Surgeon: Jolaine Artist, MD;  [facility] C[facility] LAB;  Service: Cardiovascular;  Laterality: N/A;  . TESTICLE SURGERY     testicular cancer  surgery   Allergies  Allergen Reactions  . Darvocet [Propoxyphene N-Acetaminophen] Anaphylaxis    Throat closes  . Propoxyphene Anaphylaxis and Swelling   Current Outpatient Medications on File Prior to Visit  Medication Sig Dispense Refill  . albuterol (VENTOLIN HFA) 108 (90 Base) MCG/ACT inhaler Inhale 2 puffs into the lungs every 6 (six) hours as needed for wheezing or shortness of breath. 18 g 0  . alfuzosin (UROXATRAL) 10 MG 24 hr tablet Take 10 mg by mouth as needed. For UTI    . ALPRAZolam (XANAX) 1 MG tablet Take by mouth.    Marland Kitchen aspirin 325 MG tablet Take 325 mg by mouth daily.    . carvedilol (COREG) 25 MG tablet TAKE 1 TABLET TWICE DAILY WITH A MEAL. 180 tablet 3  . clopidogrel (PLAVIX) 75 MG tablet TAKE 1 TABLET EACH DAY. 90 tablet 0  . cyanocobalamin (,VITAMIN B-12,) 1000 MCG/ML injection Inject 1 mL (1,000 mcg total) into the muscle every 30 (thirty) days. 10 mL 3  . diazepam (VALIUM) 10 MG tablet   2  . ezetimibe (ZETIA) 10 MG tablet TAKE 1 TABLET EACH DAY. 90 tablet 3  . lactulose (CHRONULAC) 10 GM/15ML solution Take 15 mLs (10 g total) by mouth 2 (two) times daily as needed for mild constipation. 1892 mL 0  . lisinopril (PRINIVIL,ZESTRIL) 5 MG tablet Take 1 tablet (5 mg total) by mouth 2 (two) times daily. 180 tablet  0  . LORazepam (ATIVAN) 1 MG tablet Take 1 mg by mouth 4 (four) times daily as needed for anxiety or sleep. For anxiety    . morphine (MSIR) 30 MG tablet Take 1 tablet (30 mg total) by mouth every 6 (six) hours as needed for severe pain. 120 tablet 0  . mupirocin ointment (BACTROBAN) 2 % Apply 1 application topically 2 (two) times daily. 22 g 0  . naproxen (NAPROSYN) 500 MG tablet Take 1 tablet (500 mg total) by mouth 2 (two) times daily. 30 tablet 0  . nitroGLYCERIN (NITROLINGUAL) 0.4 MG/SPRAY spray Place under the tongue.    . ondansetron (ZOFRAN-ODT) 8 MG disintegrating tablet Take 1 tablet (8 mg total) by mouth every 8 (eight) hours as needed for nausea or  vomiting. 20 tablet 1  . pantoprazole (PROTONIX) 40 MG tablet Take 1 tablet (40 mg total) by mouth daily. 30 tablet 3  . polyethylene glycol powder (GLYCOLAX/MIRALAX) powder MIX 1 CAPFUL TWICE DAILY AS NEEDED. 250 g 0  . ranitidine (ZANTAC) 150 MG tablet Take 1 tablet (150 mg total) by mouth 2 (two) times daily. 60 tablet 0  . rosuvastatin (CRESTOR) 40 MG tablet TAKE 1 TABLET DAILY. 90 tablet 3  . sildenafil (REVATIO) 20 MG tablet Take by mouth.    . spironolactone (ALDACTONE) 25 MG tablet TAKE (1/2) TABLET DAILY. 45 tablet 0  . tamsulosin (FLOMAX) 0.4 MG CAPS capsule Take 1 capsule (0.4 mg total) by mouth daily after breakfast. 90 capsule 0  . testosterone cypionate (DEPOTESTOTERONE CYPIONATE) 200 MG/ML injection Inject into the muscle every 14 (fourteen) days.      Marland Kitchen torsemide (DEMADEX) 20 MG tablet Take 1 tablet (20 mg total) by mouth daily as needed. For swelling in legs 90 tablet 2  . traZODone (DESYREL) 100 MG tablet Take 100 mg by mouth at bedtime as needed for sleep.     Marland Kitchen triamcinolone (NASACORT AQ) 55 MCG/ACT AERO nasal inhaler Place 1 spray into the nose daily. 1 Inhaler 12  . vancomycin (VANCOCIN) 125 MG capsule Take 1 capsule (125 mg total) by mouth 4 (four) times daily. 40 capsule 0  . VIAGRA 100 MG tablet TAKE 1 TABLET APPROXIMATELY 1 HOUR BEFORE NEEDING AS DIRECTED. 4 tablet 0  . zolpidem (AMBIEN) 10 MG tablet Take 1 tablet by mouth at bedtime as needed for sleep.   4   Current Facility-Administered Medications on File Prior to Visit  Medication Dose Route Frequency Provider Last Rate Last Dose  . levalbuterol (XOPENEX) nebulizer solution 0.63 mg  0.63 mg Nebulization Once Wardell Honour, MD       Social History   Socioeconomic History  . Marital status: Married    Spouse name: Not on file  . Number of children: 3  . Years of education: Not on file  . Highest education level: Not on file  Occupational History  . Not on file  Social Needs  . Financial resource strain:  Not on file  . Food insecurity:    Worry: Not on file    Inability: Not on file  . Transportation needs:    Medical: Not on file    Non-medical: Not on file  Tobacco Use  . Smoking status: Current Every Day Smoker    Packs/day: 0.50    Years: 29.00    Pack years: 14.50    Types: Cigarettes, E-cigarettes    Last attempt to quit: 07/14/2015    Years since quitting: 2.3  . Smokeless tobacco: Never Used  Substance and Sexual Activity  . Alcohol use: No    Alcohol/week: 0.0 oz  . Drug use: Yes    Types: Marijuana    Comment: Urine showed THC  . Sexual activity: Not on file  Lifestyle  . Physical activity:    Days per week: Not on file    Minutes per session: Not on file  . Stress: Not on file  Relationships  . Social connections:    Talks on phone: Not on file    Gets together: Not on file    Attends religious service: Not on file    Active member of club or organization: Not on file    Attends meetings of clubs or organizations: Not on file    Relationship status: Not on file  . Intimate partner violence:    Fear of current or ex partner: Not on file    Emotionally abused: Not on file    Physically abused: Not on file    Forced sexual activity: Not on file  Other Topics Concern  . Not on file  Social History Narrative   Marital status: married x 12 years; wife is severe alcoholic.      Children: 3 married daughters (23, 58, 69); 1 son (61yo); 3 grandchildren born in 2017      Lives: with wife, son      Left-handed   Caffeine: 3 drinks per day   Family History  Problem Relation Age of Onset  . Heart disease Father 24       Died of MI  . Cancer Mother        Objective:    There were no vitals taken for this visit. Physical Exam No results found. Depression screen [facility] 2/9 11/29/2017 11/24/2017 11/22/2017 11/17/2017 11/10/2017  Decreased Interest 0 0 1 0 3  Down, Depressed, Hopeless 0 0 1 0 3  PHQ - 2 Score 0 0 2 0 6  Altered sleeping - - - - 1  Tired, decreased  energy - - - - 1  Change in appetite - - - - 1  Feeling bad or failure about yourself  - - - - 3  Trouble concentrating - - - - 0  Moving slowly or fidgety/restless - - - - 0  Suicidal thoughts - - - - 0  PHQ-9 Score - - - - 12  Some recent data might be hidden   Fall Risk  11/29/2017 11/24/2017 11/22/2017 11/17/2017 11/10/2017  Falls in the past year? Yes No No Yes Yes  Comment - - - - -  Number falls in past yr: 2 or more - 2 or more 2 or more 2 or more  Comment - - - - -  Injury with Fall? No - Yes No No  Comment - - - - -  Risk Factor Category  - - - - High Fall Risk  Risk for fall due to : - - - - -  Risk for fall due to: Comment - - - - -  Follow up - - - - -  Comment - - - - -        Assessment & Plan:  No diagnosis found.  ***  No orders of the defined types were placed in this encounter.  No orders of the defined types were placed in this encounter.   No follow-ups on file.   Kiara Keep Elayne Guerin, M.D. Primary Care at [facility] previously [facility] [place] [address]  [place], [place]  [address] [phone] phone [phone] fax

## 2017-12-02 DIAGNOSIS — G4733 Obstructive sleep apnea (adult) (pediatric): Secondary | ICD-10-CM | POA: Diagnosis not present

## 2017-12-05 ENCOUNTER — Other Ambulatory Visit: Payer: Self-pay | Admitting: Family Medicine

## 2017-12-06 ENCOUNTER — Encounter: Payer: Self-pay | Admitting: Family Medicine

## 2017-12-07 ENCOUNTER — Other Ambulatory Visit: Payer: Self-pay | Admitting: Family Medicine

## 2017-12-08 ENCOUNTER — Ambulatory Visit: Payer: Self-pay | Admitting: *Deleted

## 2017-12-08 ENCOUNTER — Telehealth: Payer: Self-pay | Admitting: Family Medicine

## 2017-12-08 NOTE — Telephone Encounter (Signed)
Call placed in triage que. Left VM for patient to phone back and speak with a nurse as to why he is requesting a refill for Vancomycin. Looks like it was refused yesterday but no note attached regarding any patient contact.

## 2017-12-08 NOTE — Telephone Encounter (Signed)
Pt. returned call.  Questioned if he needs another round of Vancomycin.  Reported he had episodes of diarrhea and vomiting since Friday 5/24.  Reported the vomiting subsided on Sunday, and the diarrhea subsided on Monday.  Stated he had an episode of burping on Friday with very foul fecal odor.  Stated he started taking Pepto Bismol over the weekend, and feels that helped improve the symptoms. Reported last stool was on Monday; loose and dark. Denied any bright red blood per rectum. Stated he knows the Pepto Bismol caused dark stool.  Denied abdominal pain.  Reported has been eating white toast and eating broth.  Feels he is staying hydrated.  Stated his urine output is okay.  C/o weakness and intermittent dizziness.  Stated that Dr. Tamala Julian is aware of this, and that it may be somewhat worse since she saw him last.  Reported last temperature was 95.6 degrees; checked on Sunday about 2:00 AM.  Reported he was at oral surgeon's office today, and BP was 120/60.  Stated he is awaiting a call from [facility] and also to schedule the CT Abd. and Pelvis.  Noted the referral is in place for both GI and CT Abd./ Pelvis.  Gave him phone number to call [facility].  Reported he completed his RX on Vancomycin last night.  Reported has an appt. with Dr. Tamala Julian on Friday.  Advised that he should proceed to ER if weakness or dizziness worsens.   Verb. Understanding   Will make Dr. Tamala Julian aware of request and of status report.

## 2017-12-09 DIAGNOSIS — F4322 Adjustment disorder with anxiety: Secondary | ICD-10-CM | POA: Diagnosis not present

## 2017-12-10 ENCOUNTER — Other Ambulatory Visit: Payer: Self-pay

## 2017-12-10 ENCOUNTER — Ambulatory Visit: Payer: BLUE CROSS/BLUE SHIELD | Admitting: Family Medicine

## 2017-12-10 ENCOUNTER — Encounter: Payer: Self-pay | Admitting: Family Medicine

## 2017-12-10 ENCOUNTER — Ambulatory Visit (INDEPENDENT_AMBULATORY_CARE_PROVIDER_SITE_OTHER): Payer: BLUE CROSS/BLUE SHIELD

## 2017-12-10 VITALS — BP 112/70 | HR 73 | Temp 99.0°F | Resp 18 | Ht 71.0 in | Wt 190.0 lb

## 2017-12-10 DIAGNOSIS — F411 Generalized anxiety disorder: Secondary | ICD-10-CM

## 2017-12-10 DIAGNOSIS — A0472 Enterocolitis due to Clostridium difficile, not specified as recurrent: Secondary | ICD-10-CM

## 2017-12-10 DIAGNOSIS — T402X5A Adverse effect of other opioids, initial encounter: Secondary | ICD-10-CM

## 2017-12-10 DIAGNOSIS — M5442 Lumbago with sciatica, left side: Secondary | ICD-10-CM

## 2017-12-10 DIAGNOSIS — I2510 Atherosclerotic heart disease of native coronary artery without angina pectoris: Secondary | ICD-10-CM

## 2017-12-10 DIAGNOSIS — I5042 Chronic combined systolic (congestive) and diastolic (congestive) heart failure: Secondary | ICD-10-CM

## 2017-12-10 DIAGNOSIS — K5903 Drug induced constipation: Secondary | ICD-10-CM

## 2017-12-10 DIAGNOSIS — R197 Diarrhea, unspecified: Secondary | ICD-10-CM | POA: Diagnosis not present

## 2017-12-10 DIAGNOSIS — M5441 Lumbago with sciatica, right side: Secondary | ICD-10-CM

## 2017-12-10 DIAGNOSIS — F5089 Other specified eating disorder: Secondary | ICD-10-CM

## 2017-12-10 DIAGNOSIS — G8929 Other chronic pain: Secondary | ICD-10-CM

## 2017-12-10 MED ORDER — GABAPENTIN 300 MG PO CAPS: 300.0000 mg | ORAL_CAPSULE | Freq: Three times a day (TID) | ORAL | 3 refills | Status: DC

## 2017-12-10 MED ORDER — VANCOMYCIN HCL 125 MG PO CAPS: 125.0000 mg | ORAL_CAPSULE | Freq: Four times a day (QID) | ORAL | 0 refills | Status: DC

## 2017-12-10 MED ORDER — SUCRALFATE 1 G PO TABS: 1.0000 g | ORAL_TABLET | Freq: Three times a day (TID) | ORAL | 0 refills | Status: DC

## 2017-12-10 NOTE — Progress Notes (Signed)
Subjective:    Patient ID: [name], male    DOB: [birth year], 57 y.o.   MRN: [id number]  12/10/2017  Psychogenic Vomiting (pt states he feel a little better but still have some nausea )    HPI This 57 y.o. male presents for two week follow-up regarding recent C. Difficile colitis, nausea with vomiting, constipation.  One week ago, developed excessive belching.  Eating a light diet; chicken soup; heaviest meal is grilled cheese.   Really hungry. Eating an Ensure.  Tries not to think about food.   CT scheduled for 12/17/17; needs to get contrast from PCP. Diarrhea started again; no vomiting.   Pepto really calmed stomach one week ago. Major stress at home; must leave home to speak with attorney.  Knows that must leave. Refills Zofran.   If stops Zofran, will develop nausea. Taking Lactulose daily.  Started Pepto three days ago. Formed stool today; color is black due to pepto. Yesterday, 5-6 stools small amount of stool.  What is eating is going through patient.    Immunization History  Administered Date(s) Administered  . Influenza Split 04/12/2013, 09/11/2015  . Influenza Whole 05/25/2012  . Influenza,inj,Quad PF,6+ Mos 06/11/2014, 09/25/2015, 05/11/2016, 07/01/2016, 06/14/2017  . Influenza-Unspecified 05/25/2012, 04/20/2013, 03/14/2017  . Pneumococcal Conjugate-13 06/11/2014  . Pneumococcal Polysaccharide-23 07/13/2013, 08/10/2013  . Td 08/10/2013    Review of Systems  Constitutional: Negative for activity change, appetite change, chills, diaphoresis, fatigue and fever.  Respiratory: Negative for cough and shortness of breath.   Cardiovascular: Negative for chest pain, palpitations and leg swelling.  Gastrointestinal: Positive for abdominal distention, constipation, diarrhea, nausea and vomiting. Negative for abdominal pain, anal bleeding, blood in stool and rectal pain.  Endocrine: Negative for cold intolerance, heat intolerance, polydipsia, polyphagia and polyuria.    Skin: Negative for color change, rash and wound.  Neurological: Negative for dizziness, tremors, seizures, syncope, facial asymmetry, speech difficulty, weakness, light-headedness, numbness and headaches.  Psychiatric/Behavioral: Positive for agitation and dysphoric mood. Negative for sleep disturbance. The patient is nervous/anxious.     Past Medical History:  Diagnosis Date  . Anxiety   . Automatic implantable cardiac defibrillator [employer]    Analyze ST  . Benign prostatic hypertrophy   . Benzodiazepine dependence (HCC)    chronic  . CAD (coronary artery disease)    Last cath 2/12. 3-v CAD. Failed PCI of distal RCAc  CATH [facility] 4/13 with DES to  LAD X2  . CHF (congestive heart failure) ([place])   . Chronic back pain    lumbar stenosis  . Chronic systolic heart failure (HCC)    EF 20-25%. s/p ST. Jude ICD  . Depression   . DJD (degenerative joint disease)   . History of testicular cancer   . Narcotic dependence (HCC)    chronic  . Vitamin B12 deficiency 07/22/2016   Past Surgical History:  Procedure Laterality Date  . ASD REPAIR, SINUS VENOSUS    . CARDIAC DEFIBRILLATOR PLACEMENT  08/2010  . HERNIA REPAIR    . LEFT HEART CATHETERIZATION WITH CORONARY ANGIOGRAM N/A 06/14/2014   Procedure: LEFT HEART CATHETERIZATION WITH CORONARY ANGIOGRAM;  Surgeon: Jolaine Artist, MD;  Location: [facility] CATH LAB;  Service: Cardiovascular;  Laterality: N/A;  . TESTICLE SURGERY     testicular cancer surgery   Allergies  Allergen Reactions  . Darvocet [Propoxyphene N-Acetaminophen] Anaphylaxis    Throat closes  . Propoxyphene Anaphylaxis and Swelling   Current Outpatient Medications on File Prior to Visit  Medication Sig  Dispense Refill  . albuterol (VENTOLIN HFA) 108 (90 Base) MCG/ACT inhaler Inhale 2 puffs into the lungs every 6 (six) hours as needed for wheezing or shortness of breath. 18 g 0  . alfuzosin (UROXATRAL) 10 MG 24 hr tablet Take 10 mg by mouth as needed. For UTI    . ALPRAZolam  (XANAX) 1 MG tablet Take by mouth.    Marland Kitchen aspirin 325 MG tablet Take 325 mg by mouth daily.    . carvedilol (COREG) 25 MG tablet TAKE 1 TABLET TWICE DAILY WITH A MEAL. 180 tablet 3  . diazepam (VALIUM) 10 MG tablet   2  . ezetimibe (ZETIA) 10 MG tablet TAKE 1 TABLET EACH DAY. 90 tablet 3  . lactulose (CHRONULAC) 10 GM/15ML solution Take 15 mLs (10 g total) by mouth 2 (two) times daily as needed for mild constipation. 1892 mL 0  . lisinopril (PRINIVIL,ZESTRIL) 5 MG tablet Take 1 tablet (5 mg total) by mouth 2 (two) times daily. 180 tablet 0  . LORazepam (ATIVAN) 1 MG tablet Take 1 mg by mouth 4 (four) times daily as needed for anxiety or sleep. For anxiety    . morphine (MSIR) 30 MG tablet Take 1 tablet (30 mg total) by mouth every 6 (six) hours as needed for severe pain. 120 tablet 0  . nitroGLYCERIN (NITROLINGUAL) 0.4 MG/SPRAY spray Place under the tongue.    . polyethylene glycol powder (GLYCOLAX/MIRALAX) powder MIX 1 CAPFUL TWICE DAILY AS NEEDED. 250 g 0  . rosuvastatin (CRESTOR) 40 MG tablet TAKE 1 TABLET DAILY. 90 tablet 3  . sildenafil (REVATIO) 20 MG tablet Take by mouth.    . tamsulosin (FLOMAX) 0.4 MG CAPS capsule Take 1 capsule (0.4 mg total) by mouth daily after breakfast. 90 capsule 0  . testosterone cypionate (DEPOTESTOTERONE CYPIONATE) 200 MG/ML injection Inject into the muscle every 14 (fourteen) days.      Marland Kitchen torsemide (DEMADEX) 20 MG tablet Take 1 tablet (20 mg total) by mouth daily as needed. For swelling in legs 90 tablet 2  . traZODone (DESYREL) 100 MG tablet Take 100 mg by mouth at bedtime as needed for sleep.     Marland Kitchen triamcinolone (NASACORT AQ) 55 MCG/ACT AERO nasal inhaler Place 1 spray into the nose daily. 1 Inhaler 12  . VIAGRA 100 MG tablet TAKE 1 TABLET APPROXIMATELY 1 HOUR BEFORE NEEDING AS DIRECTED. 4 tablet 0  . zolpidem (AMBIEN) 10 MG tablet Take 1 tablet by mouth at bedtime as needed for sleep.   4   No current facility-administered medications on file prior to visit.     Social History   Socioeconomic History  . Marital status: Married    Spouse name: Not on file  . Number of children: 3  . Years of education: Not on file  . Highest education level: Not on file  Occupational History  . Not on file  Social Needs  . Financial resource strain: Not on file  . Food insecurity:    Worry: Not on file    Inability: Not on file  . Transportation needs:    Medical: Not on file    Non-medical: Not on file  Tobacco Use  . Smoking status: Current Every Day Smoker    Packs/day: 0.50    Years: 29.00    Pack years: 14.50    Types: Cigarettes, E-cigarettes    Last attempt to quit: 07/14/2015    Years since quitting: 2.5  . Smokeless tobacco: Never Used  Substance and Sexual Activity  .  Alcohol use: No    Alcohol/week: 0.0 oz  . Drug use: Yes    Types: Marijuana    Comment: Urine showed THC  . Sexual activity: Not on file  Lifestyle  . Physical activity:    Days per week: Not on file    Minutes per session: Not on file  . Stress: Not on file  Relationships  . Social connections:    Talks on phone: Not on file    Gets together: Not on file    Attends religious service: Not on file    Active member of club or organization: Not on file    Attends meetings of clubs or organizations: Not on file    Relationship status: Not on file  . Intimate partner violence:    Fear of current or ex partner: Not on file    Emotionally abused: Not on file    Physically abused: Not on file    Forced sexual activity: Not on file  Other Topics Concern  . Not on file  Social History Narrative   Marital status: married x 12 years; wife is severe alcoholic.      Children: 3 married daughters (23, 28, 48); 1 son (83yo); 3 grandchildren born in 2017      Lives: with wife, son      Left-handed   Caffeine: 3 drinks per day   Family History  Problem Relation Age of Onset  . Heart disease Father 19       Died of MI  . Cancer Mother        Objective:    BP  112/70   Pulse 73   Temp 99 F (37.2 C) (Oral)   Resp 18   Ht 5' 11"  (1.803 m)   Wt 190 lb (86.2 kg)   SpO2 96%   BMI 26.50 kg/m  Physical Exam  Constitutional: He is oriented to person, place, and time. He appears well-developed and well-nourished. No distress.  HENT:  Head: Normocephalic and atraumatic.  Right Ear: External ear normal.  Left Ear: External ear normal.  Nose: Nose normal.  Mouth/Throat: Oropharynx is clear and moist.  Eyes: Pupils are equal, round, and reactive to light. Conjunctivae and EOM are normal.  Neck: Normal range of motion. Neck supple. Carotid bruit is not present. No thyromegaly present.  Cardiovascular: Normal rate, regular rhythm, normal heart sounds and intact distal pulses. Exam reveals no gallop and no friction rub.  No murmur heard. Pulmonary/Chest: Effort normal and breath sounds normal. He has no wheezes. He has no rales.  Abdominal: Soft. Bowel sounds are normal. He exhibits no distension and no mass. There is no tenderness. There is no rebound and no guarding.  Lymphadenopathy:    He has no cervical adenopathy.  Neurological: He is alert and oriented to person, place, and time. No cranial nerve deficit.  Skin: Skin is warm and dry. No rash noted. He is not diaphoretic.  Psychiatric: He has a normal mood and affect. His behavior is normal.  Nursing note and vitals reviewed.  No results found.        Assessment & Plan:   1. C. difficile colitis   2. Psychogenic vomiting with nausea   3. Therapeutic opioid induced constipation   4. Generalized anxiety disorder   5. Atherosclerosis of native coronary artery of native heart without angina pectoris   6. COMBINED HEART FAILURE, CHRONIC     Persistent nausea with vomiting: obtain AAA; scheduled for CT abdomen and pelvis  in upcoming week; patient to call [facility] to schedule appointment.  Continue Zofran and Protonix.  To ED for acute worsening.  BRAT diet, hydration.  Rx Carafate.  C.  Difficile colitis: stable; s/p vancomycin orally. Diarrhea recurrent so refill of Vancomycin provided.  Call for GI appointment.  CAD/CHF: stable; followed by cardiology; euvolemic.  Chronic lower back pain: stable; no longer followed by pain management; agreeable to refill Gabapentin.    Orders Placed This Encounter  Procedures  . DG Abd Acute W/Chest    Standing Status:   Future    Number of Occurrences:   1    Standing Expiration Date:   12/10/2018    Order Specific Question:   Reason for exam:    Answer:   nausea with diarrhea; C. Difficile colitis dx last month    Order Specific Question:   Preferred imaging location?    Answer:   External  . CBC with Differential/Platelet  . Comprehensive metabolic panel   Meds ordered this encounter  Medications  . DISCONTD: vancomycin (VANCOCIN) 125 MG capsule    Sig: Take 1 capsule (125 mg total) by mouth 4 (four) times daily.    Dispense:  28 capsule    Refill:  0  . sucralfate (CARAFATE) 1 g tablet    Sig: Take 1 tablet (1 g total) by mouth 4 (four) times daily -  with meals and at bedtime.    Dispense:  120 tablet    Refill:  0  . DISCONTD: gabapentin (NEURONTIN) 300 MG capsule    Sig: Take 1 capsule (300 mg total) by mouth 3 (three) times daily.    Dispense:  90 capsule    Refill:  3    Return in about 1 week (around 12/17/2017) for recheck.   Mcclain Shall Elayne Guerin, M.D. Primary Care at [facility] previously Urgent [place] [address] [place],   [address] [phone] phone [phone] fax

## 2017-12-10 NOTE — Patient Instructions (Addendum)
[facility]:  Silvano Rusk, MD Havery Moros, MD Wilfrid Lund, MD    IF you received an x-ray today, you will receive an invoice from [facility]. Please contact [facility] at [phone] with questions or concerns regarding your invoice.   IF you received labwork today, you will receive an invoice from [facility]. Please contact [facility] at [phone] with questions or concerns regarding your invoice.   Our billing staff will not be able to assist you with questions regarding bills from these companies.  You will be contacted with the lab results as soon as they are available. The fastest way to get your results is to activate your My Chart account. Instructions are located on the last page of this paperwork. If you have not heard from [place] regarding the results in 2 weeks, please contact this office.

## 2017-12-11 DIAGNOSIS — G4737 Central sleep apnea in conditions classified elsewhere: Secondary | ICD-10-CM | POA: Diagnosis not present

## 2017-12-11 DIAGNOSIS — G4733 Obstructive sleep apnea (adult) (pediatric): Secondary | ICD-10-CM | POA: Diagnosis not present

## 2017-12-11 LAB — COMPREHENSIVE METABOLIC PANEL
ALT: 16 IU/L (ref 0–44)
AST: 15 IU/L (ref 0–40)
Albumin/Globulin Ratio: 1.7 (ref 1.2–2.2)
Albumin: 4 g/dL (ref 3.5–5.5)
Alkaline Phosphatase: 74 IU/L (ref 39–117)
BUN/Creatinine Ratio: 8 — ABNORMAL LOW (ref 9–20)
BUN: 7 mg/dL (ref 6–24)
Bilirubin Total: 0.2 mg/dL (ref 0.0–1.2)
CO2: 25 mmol/L (ref 20–29)
Calcium: 9.2 mg/dL (ref 8.7–10.2)
Chloride: 97 mmol/L (ref 96–106)
Creatinine, Ser: 0.9 mg/dL (ref 0.76–1.27)
GFR calc Af Amer: 108 mL/min/{1.73_m2} (ref >59)
GFR calc non Af Amer: 94 mL/min/{1.73_m2} (ref >59)
Globulin, Total: 2.3 g/dL (ref 1.5–4.5)
Glucose: 89 mg/dL (ref 65–99)
Potassium: 4.5 mmol/L (ref 3.5–5.2)
Sodium: 137 mmol/L (ref 134–144)
Total Protein: 6.3 g/dL (ref 6.0–8.5)

## 2017-12-11 LAB — CBC WITH DIFFERENTIAL/PLATELET
Basophils Absolute: 0 10*3/uL (ref 0.0–0.2)
Basos: 0 %
EOS (ABSOLUTE): 1 10*3/uL — ABNORMAL HIGH (ref 0.0–0.4)
Eos: 12 %
Hematocrit: 46 % (ref 37.5–51.0)
Hemoglobin: 16.2 g/dL (ref 13.0–17.7)
Immature Grans (Abs): 0 10*3/uL (ref 0.0–0.1)
Immature Granulocytes: 0 %
Lymphocytes Absolute: 1.4 10*3/uL (ref 0.7–3.1)
Lymphs: 17 %
MCH: 32.6 pg (ref 26.6–33.0)
MCHC: 35.2 g/dL (ref 31.5–35.7)
MCV: 93 fL (ref 79–97)
Monocytes Absolute: 0.5 10*3/uL (ref 0.1–0.9)
Monocytes: 6 %
Neutrophils Absolute: 5.2 10*3/uL (ref 1.4–7.0)
Neutrophils: 65 %
Platelets: 158 10*3/uL (ref 150–450)
RBC: 4.97 x10E6/uL (ref 4.14–5.80)
RDW: 13.9 % (ref 12.3–15.4)
WBC: 8.1 10*3/uL (ref 3.4–10.8)

## 2017-12-14 NOTE — Telephone Encounter (Signed)
Office visit on 12/10/17; concerned addressed.

## 2017-12-17 ENCOUNTER — Other Ambulatory Visit: Payer: Self-pay

## 2017-12-18 ENCOUNTER — Ambulatory Visit: Payer: BLUE CROSS/BLUE SHIELD | Admitting: Family Medicine

## 2017-12-20 ENCOUNTER — Telehealth: Payer: Self-pay | Admitting: Family Medicine

## 2017-12-20 NOTE — Telephone Encounter (Signed)
CT scan of abdomen and pelvis has been approved through [employer] on 11/30/17 at 8:51pm.   Auth #: [id number]

## 2017-12-22 ENCOUNTER — Ambulatory Visit: Payer: BLUE CROSS/BLUE SHIELD | Admitting: Family Medicine

## 2017-12-22 ENCOUNTER — Encounter: Payer: Self-pay | Admitting: Family Medicine

## 2017-12-22 ENCOUNTER — Ambulatory Visit (INDEPENDENT_AMBULATORY_CARE_PROVIDER_SITE_OTHER): Payer: BLUE CROSS/BLUE SHIELD

## 2017-12-22 ENCOUNTER — Other Ambulatory Visit: Payer: Self-pay

## 2017-12-22 VITALS — BP 142/80 | HR 80 | Temp 98.0°F | Resp 16 | Wt 201.0 lb

## 2017-12-22 DIAGNOSIS — R2681 Unsteadiness on feet: Secondary | ICD-10-CM

## 2017-12-22 DIAGNOSIS — I2510 Atherosclerotic heart disease of native coronary artery without angina pectoris: Secondary | ICD-10-CM

## 2017-12-22 DIAGNOSIS — M48062 Spinal stenosis, lumbar region with neurogenic claudication: Secondary | ICD-10-CM

## 2017-12-22 DIAGNOSIS — M545 Low back pain: Secondary | ICD-10-CM | POA: Diagnosis not present

## 2017-12-22 DIAGNOSIS — A0472 Enterocolitis due to Clostridium difficile, not specified as recurrent: Secondary | ICD-10-CM

## 2017-12-22 DIAGNOSIS — M25551 Pain in right hip: Secondary | ICD-10-CM | POA: Diagnosis not present

## 2017-12-22 DIAGNOSIS — F5089 Other specified eating disorder: Secondary | ICD-10-CM | POA: Diagnosis not present

## 2017-12-22 DIAGNOSIS — I739 Peripheral vascular disease, unspecified: Secondary | ICD-10-CM

## 2017-12-22 MED ORDER — GABAPENTIN 300 MG PO CAPS: 600.0000 mg | ORAL_CAPSULE | Freq: Three times a day (TID) | ORAL | 3 refills | Status: DC

## 2017-12-22 NOTE — Patient Instructions (Signed)
     IF you received an x-ray today, you will receive an invoice from [facility]. Please contact [facility] at [phone] with questions or concerns regarding your invoice.   IF you received labwork today, you will receive an invoice from [facility]. Please contact [facility] at [phone] with questions or concerns regarding your invoice.   Our billing staff will not be able to assist you with questions regarding bills from these companies.  You will be contacted with the lab results as soon as they are available. The fastest way to get your results is to activate your My Chart account. Instructions are located on the last page of this paperwork. If you have not heard from us regarding the results in 2 weeks, please contact this office.     

## 2017-12-22 NOTE — Progress Notes (Deleted)
Subjective:    Patient ID: [name], male    DOB: 08/12/1960, 57 y.o.   MRN: [id number]  12/22/2017  C Difficile (follow-up from 12/10/17)    HPI This 57 y.o. male presents for evaluation of ***. BP Readings from Last 3 Encounters:  12/22/17 (!) 142/80  12/10/17 112/70  11/29/17 102/68   Wt Readings from Last 3 Encounters:  12/22/17 201 lb (91.2 kg)  12/10/17 190 lb (86.2 kg)  11/29/17 192 lb (87.1 kg)   Immunization History  Administered Date(s) Administered  . Influenza Split 04/12/2013, 09/11/2015  . Influenza Whole 05/25/2012  . Influenza,inj,Quad PF,6+ Mos 06/11/2014, 09/25/2015, 05/11/2016, 07/01/2016, 06/14/2017  . Influenza-Unspecified 05/25/2012, 04/20/2013, 03/14/2017  . Pneumococcal Conjugate-13 06/11/2014  . Pneumococcal Polysaccharide-23 07/13/2013, 08/10/2013  . Td 08/10/2013    Review of Systems  Past Medical History:  Diagnosis Date  . Anxiety   . Automatic implantable cardiac defibrillator [employer] [facility]    Analyze ST  . Benign prostatic hypertrophy   . Benzodiazepine dependence (HCC)    chronic  . CAD (coronary artery disease)    Last cath 2/12. 3-v CAD. Failed PCI of distal RCAc  CATH [facility] 4/13 with DES to  LAD X2  . CHF (congestive heart failure) ([place])   . Chronic back pain    lumbar stenosis  . Chronic systolic heart failure (HCC)    EF 20-25%. s/p ST. Jude ICD  . Depression   . DJD (degenerative joint disease)   . History of testicular cancer   . Narcotic dependence (HCC)    chronic  . Vitamin B12 deficiency 07/22/2016   Past Surgical History:  Procedure Laterality Date  . ASD REPAIR, SINUS VENOSUS    . CARDIAC DEFIBRILLATOR PLACEMENT  08/2010  . HERNIA REPAIR    . LEFT HEART CATHETERIZATION WITH CORONARY ANGIOGRAM N/A 06/14/2014   Procedure: LEFT HEART CATHETERIZATION WITH CORONARY ANGIOGRAM;  Surgeon: Jolaine Artist, MD;  Location: [facility] CATH LAB;  Service: Cardiovascular;  Laterality: N/A;  . TESTICLE SURGERY     testicular cancer  surgery   Allergies  Allergen Reactions  . Darvocet [Propoxyphene N-Acetaminophen] Anaphylaxis    Throat closes  . Propoxyphene Anaphylaxis and Swelling   Current Outpatient Medications on File Prior to Visit  Medication Sig Dispense Refill  . albuterol (VENTOLIN HFA) 108 (90 Base) MCG/ACT inhaler Inhale 2 puffs into the lungs every 6 (six) hours as needed for wheezing or shortness of breath. 18 g 0  . alfuzosin (UROXATRAL) 10 MG 24 hr tablet Take 10 mg by mouth as needed. For UTI    . ALPRAZolam (XANAX) 1 MG tablet Take by mouth.    Marland Kitchen aspirin 325 MG tablet Take 325 mg by mouth daily.    . carvedilol (COREG) 25 MG tablet TAKE 1 TABLET TWICE DAILY WITH A MEAL. 180 tablet 3  . clopidogrel (PLAVIX) 75 MG tablet TAKE 1 TABLET EACH DAY. 90 tablet 0  . cyanocobalamin (,VITAMIN B-12,) 1000 MCG/ML injection Inject 1 mL (1,000 mcg total) into the muscle every 30 (thirty) days. 10 mL 3  . diazepam (VALIUM) 10 MG tablet   2  . ezetimibe (ZETIA) 10 MG tablet TAKE 1 TABLET EACH DAY. 90 tablet 3  . gabapentin (NEURONTIN) 300 MG capsule Take 1 capsule (300 mg total) by mouth 3 (three) times daily. 90 capsule 3  . lactulose (CHRONULAC) 10 GM/15ML solution Take 15 mLs (10 g total) by mouth 2 (two) times daily as needed for mild constipation. 1892 mL 0  .  lisinopril (PRINIVIL,ZESTRIL) 5 MG tablet Take 1 tablet (5 mg total) by mouth 2 (two) times daily. 180 tablet 0  . LORazepam (ATIVAN) 1 MG tablet Take 1 mg by mouth 4 (four) times daily as needed for anxiety or sleep. For anxiety    . morphine (MSIR) 30 MG tablet Take 1 tablet (30 mg total) by mouth every 6 (six) hours as needed for severe pain. 120 tablet 0  . mupirocin ointment (BACTROBAN) 2 % Apply 1 application topically 2 (two) times daily. 22 g 0  . naproxen (NAPROSYN) 500 MG tablet Take 1 tablet (500 mg total) by mouth 2 (two) times daily. 30 tablet 0  . nitroGLYCERIN (NITROLINGUAL) 0.4 MG/SPRAY spray Place under the tongue.    . ondansetron  (ZOFRAN-ODT) 8 MG disintegrating tablet Take 1 tablet (8 mg total) by mouth every 8 (eight) hours as needed for nausea or vomiting. 20 tablet 1  . pantoprazole (PROTONIX) 40 MG tablet Take 1 tablet (40 mg total) by mouth daily. 30 tablet 3  . polyethylene glycol powder (GLYCOLAX/MIRALAX) powder MIX 1 CAPFUL TWICE DAILY AS NEEDED. 250 g 0  . ranitidine (ZANTAC) 150 MG tablet Take 1 tablet (150 mg total) by mouth 2 (two) times daily. 60 tablet 0  . rosuvastatin (CRESTOR) 40 MG tablet TAKE 1 TABLET DAILY. 90 tablet 3  . sildenafil (REVATIO) 20 MG tablet Take by mouth.    . spironolactone (ALDACTONE) 25 MG tablet TAKE (1/2) TABLET DAILY. 45 tablet 0  . sucralfate (CARAFATE) 1 g tablet Take 1 tablet (1 g total) by mouth 4 (four) times daily -  with meals and at bedtime. 120 tablet 0  . tamsulosin (FLOMAX) 0.4 MG CAPS capsule Take 1 capsule (0.4 mg total) by mouth daily after breakfast. 90 capsule 0  . testosterone cypionate (DEPOTESTOTERONE CYPIONATE) 200 MG/ML injection Inject into the muscle every 14 (fourteen) days.      Marland Kitchen torsemide (DEMADEX) 20 MG tablet Take 1 tablet (20 mg total) by mouth daily as needed. For swelling in legs 90 tablet 2  . traZODone (DESYREL) 100 MG tablet Take 100 mg by mouth at bedtime as needed for sleep.     Marland Kitchen triamcinolone (NASACORT AQ) 55 MCG/ACT AERO nasal inhaler Place 1 spray into the nose daily. 1 Inhaler 12  . vancomycin (VANCOCIN) 125 MG capsule Take 1 capsule (125 mg total) by mouth 4 (four) times daily. 28 capsule 0  . VIAGRA 100 MG tablet TAKE 1 TABLET APPROXIMATELY 1 HOUR BEFORE NEEDING AS DIRECTED. 4 tablet 0  . zolpidem (AMBIEN) 10 MG tablet Take 1 tablet by mouth at bedtime as needed for sleep.   4   Current Facility-Administered Medications on File Prior to Visit  Medication Dose Route Frequency Provider Last Rate Last Dose  . levalbuterol (XOPENEX) nebulizer solution 0.63 mg  0.63 mg Nebulization Once Wardell Honour, MD       Social History    Socioeconomic History  . Marital status: Married    Spouse name: Not on file  . Number of children: 3  . Years of education: Not on file  . Highest education level: Not on file  Occupational History  . Not on file  Social Needs  . Financial resource strain: Not on file  . Food insecurity:    Worry: Not on file    Inability: Not on file  . Transportation needs:    Medical: Not on file    Non-medical: Not on file  Tobacco Use  . Smoking  status: Current Every Day Smoker    Packs/day: 0.50    Years: 29.00    Pack years: 14.50    Types: Cigarettes, E-cigarettes    Last attempt to quit: 07/14/2015    Years since quitting: 2.4  . Smokeless tobacco: Never Used  Substance and Sexual Activity  . Alcohol use: No    Alcohol/week: 0.0 oz  . Drug use: Yes    Types: Marijuana    Comment: Urine showed THC  . Sexual activity: Not on file  Lifestyle  . Physical activity:    Days per week: Not on file    Minutes per session: Not on file  . Stress: Not on file  Relationships  . Social connections:    Talks on phone: Not on file    Gets together: Not on file    Attends religious service: Not on file    Active member of club or organization: Not on file    Attends meetings of clubs or organizations: Not on file    Relationship status: Not on file  . Intimate partner violence:    Fear of current or ex partner: Not on file    Emotionally abused: Not on file    Physically abused: Not on file    Forced sexual activity: Not on file  Other Topics Concern  . Not on file  Social History Narrative   Marital status: married x 12 years; wife is severe alcoholic.      Children: 3 married daughters (23, 26, 22); 1 son (83yo); 3 grandchildren born in 2017      Lives: with wife, son      Left-handed   Caffeine: 3 drinks per day   Family History  Problem Relation Age of Onset  . Heart disease Father 58       Died of MI  . Cancer Mother        Objective:    BP (!) 142/80   Pulse 80    Temp 98 F (36.7 C) (Oral)   Resp 16   Wt 201 lb (91.2 kg)   SpO2 96%   BMI 28.03 kg/m  Physical Exam No results found. Depression screen [facility] 2/9 12/22/2017 12/10/2017 11/29/2017 11/24/2017 11/22/2017  Decreased Interest 1 0 0 0 1  Down, Depressed, Hopeless 1 0 0 0 1  PHQ - 2 Score 2 0 0 0 2  Altered sleeping 0 - - - -  Tired, decreased energy 0 - - - -  Change in appetite 0 - - - -  Feeling bad or failure about yourself  0 - - - -  Trouble concentrating 0 - - - -  Moving slowly or fidgety/restless 0 - - - -  Suicidal thoughts 0 - - - -  PHQ-9 Score 2 - - - -  Some recent data might be hidden   Fall Risk  12/22/2017 12/10/2017 11/29/2017 11/24/2017 11/22/2017  Falls in the past year? Yes Yes Yes No No  Comment - - - - -  Number falls in past yr: 2 or more 2 or more 2 or more - 2 or more  Comment - - - - -  Injury with Fall? No No No - Yes  Comment - - - - -  Risk Factor Category  - - - - -  Risk for fall due to : - - - - -  Risk for fall due to: Comment - - - - -  Follow up - - - - -  Comment - - - - -        Assessment & Plan:  No diagnosis found.  ***  No orders of the defined types were placed in this encounter.  No orders of the defined types were placed in this encounter.   No follow-ups on file.   Kristi Elayne Guerin, M.D. Primary Care at [facility] previously Urgent [place] [address] [place], [place]  [address] [phone] phone [phone] fax

## 2017-12-22 NOTE — Progress Notes (Signed)
Subjective:    Patient ID: [name], male    DOB: 25-Jul-1960, 57 y.o.   MRN: [id number]  12/22/2017  C Difficile (follow-up from 12/10/17)    HPI This 57 y.o. male presents for evaluation of recent C. Difficile colitis, psychogenic nausea with vomiting. School calls a meeting regarding [name]; he has regressed to a fourth grader at school; not engaging in school activities.  Has been checked out during the school year.  Father built a [employer] camp near [place].  After twelve teachers advised that [name] doing so poorly, very upset.  Head Master of the School and teacher took [name] to camp this morning.  Pt was going to drive [name] but better decision to coordinate trip.  [name] was really excited to go to camp for eight weeks; atleast four weeks.  Pt can go visit [name] in [place].   Mother packed clothes up.  Every piece of clothing had to be marked; has a dry cleaning.  Safe place.   Father at school every day; this issue has been going on since March 2019.   Has been taking Vancomycin twice daily; no diarrhea.   [name] is tolerable.  If tries to eat anything else, vomits.  Tried greek salad, vomiting.  Drinking sodas regular.  Normally eating soup.    Still cannot walk; legs are not strong enough to walk up three steps to bedroom.  Too weak.  Walking from handicap.  Legs are spastic; cannot make them move forward.  Legs are hurting horribly.   Now waking up with sharp pains in feet; taking one gabapentin qhs.  Previously took 800mg  tid.   Lower back pain is slightly worse.  No recent MRI. [name] was pain management. Cannot go under general surgery due to cardiomyopathy.  Psychiatrist decreased valium to 5mg  daily. Legs are really weak and give out; unable to walk up stairs.  Bad pain. Tingling is worse.  R leg is shorter.  Loses control of legs with ambulation; unable to stand. Sees [name] is cardiologist; overdue for follow-up with cardiology; scheduled to have cath  done. CT abdomen rescheduled.    BP Readings from Last 3 Encounters:  12/22/17 (!) 142/80  12/10/17 112/70  11/29/17 102/68   Wt Readings from Last 3 Encounters:  12/22/17 201 lb (91.2 kg)  12/10/17 190 lb (86.2 kg)  11/29/17 192 lb (87.1 kg)   Immunization History  Administered Date(s) Administered  . Influenza Split 04/12/2013, 09/11/2015  . Influenza Whole 05/25/2012  . Influenza,inj,Quad PF,6+ Mos 06/11/2014, 09/25/2015, 05/11/2016, 07/01/2016, 06/14/2017  . Influenza-Unspecified 05/25/2012, 04/20/2013, 03/14/2017  . Pneumococcal Conjugate-13 06/11/2014  . Pneumococcal Polysaccharide-23 07/13/2013, 08/10/2013  . Td 08/10/2013    Review of Systems  Constitutional: Negative for activity change, appetite change, chills, diaphoresis, fatigue and fever.  Respiratory: Negative for cough and shortness of breath.   Cardiovascular: Negative for chest pain, palpitations and leg swelling.  Gastrointestinal: Positive for constipation, nausea and vomiting. Negative for abdominal distention, abdominal pain, anal bleeding, blood in stool, diarrhea and rectal pain.  Endocrine: Negative for cold intolerance, heat intolerance, polydipsia, polyphagia and polyuria.  Musculoskeletal: Positive for back pain, gait problem and myalgias.  Skin: Negative for color change, rash and wound.  Neurological: Positive for weakness. Negative for dizziness, tremors, seizures, syncope, facial asymmetry, speech difficulty, light-headedness, numbness and headaches.  Psychiatric/Behavioral: Negative for dysphoric mood and sleep disturbance. The patient is not nervous/anxious.     Past Medical History:  Diagnosis Date  . Anxiety   .  Automatic implantable cardiac defibrillator [employer]    Analyze ST  . Benign prostatic hypertrophy   . Benzodiazepine dependence (HCC)    chronic  . CAD (coronary artery disease)    Last cath 2/12. 3-v CAD. Failed PCI of distal RCAc  CATH [facility] 4/13 with DES to  LAD X2  . CHF  (congestive heart failure) ([place])   . Chronic back pain    lumbar stenosis  . Chronic systolic heart failure (HCC)    EF 20-25%. s/p ST. Jude ICD  . Depression   . DJD (degenerative joint disease)   . History of testicular cancer   . Narcotic dependence (HCC)    chronic  . Vitamin B12 deficiency 07/22/2016   Past Surgical History:  Procedure Laterality Date  . ASD REPAIR, SINUS VENOSUS    . CARDIAC DEFIBRILLATOR PLACEMENT  08/2010  . HERNIA REPAIR    . LEFT HEART CATHETERIZATION WITH CORONARY ANGIOGRAM N/A 06/14/2014   Procedure: LEFT HEART CATHETERIZATION WITH CORONARY ANGIOGRAM;  Surgeon: Trevor Artist, MD;  Location: [facility] CATH LAB;  Service: Cardiovascular;  Laterality: N/A;  . TESTICLE SURGERY     testicular cancer surgery   Allergies  Allergen Reactions  . Darvocet [Propoxyphene N-Acetaminophen] Anaphylaxis    Throat closes  . Propoxyphene Anaphylaxis and Swelling   Current Outpatient Medications on File Prior to Visit  Medication Sig Dispense Refill  . albuterol (VENTOLIN HFA) 108 (90 Base) MCG/ACT inhaler Inhale 2 puffs into the lungs every 6 (six) hours as needed for wheezing or shortness of breath. 18 g 0  . alfuzosin (UROXATRAL) 10 MG 24 hr tablet Take 10 mg by mouth as needed. For UTI    . ALPRAZolam (XANAX) 1 MG tablet Take by mouth.    Marland Kitchen aspirin 325 MG tablet Take 325 mg by mouth daily.    . carvedilol (COREG) 25 MG tablet TAKE 1 TABLET TWICE DAILY WITH A MEAL. 180 tablet 3  . clopidogrel (PLAVIX) 75 MG tablet TAKE 1 TABLET EACH DAY. 90 tablet 0  . cyanocobalamin (,VITAMIN B-12,) 1000 MCG/ML injection Inject 1 mL (1,000 mcg total) into the muscle every 30 (thirty) days. 10 mL 3  . diazepam (VALIUM) 10 MG tablet   2  . ezetimibe (ZETIA) 10 MG tablet TAKE 1 TABLET EACH DAY. 90 tablet 3  . lactulose (CHRONULAC) 10 GM/15ML solution Take 15 mLs (10 g total) by mouth 2 (two) times daily as needed for mild constipation. 1892 mL 0  . lisinopril (PRINIVIL,ZESTRIL) 5 MG  tablet Take 1 tablet (5 mg total) by mouth 2 (two) times daily. 180 tablet 0  . LORazepam (ATIVAN) 1 MG tablet Take 1 mg by mouth 4 (four) times daily as needed for anxiety or sleep. For anxiety    . morphine (MSIR) 30 MG tablet Take 1 tablet (30 mg total) by mouth every 6 (six) hours as needed for severe pain. 120 tablet 0  . mupirocin ointment (BACTROBAN) 2 % Apply 1 application topically 2 (two) times daily. 22 g 0  . naproxen (NAPROSYN) 500 MG tablet Take 1 tablet (500 mg total) by mouth 2 (two) times daily. 30 tablet 0  . nitroGLYCERIN (NITROLINGUAL) 0.4 MG/SPRAY spray Place under the tongue.    . ondansetron (ZOFRAN-ODT) 8 MG disintegrating tablet Take 1 tablet (8 mg total) by mouth every 8 (eight) hours as needed for nausea or vomiting. 20 tablet 1  . pantoprazole (PROTONIX) 40 MG tablet Take 1 tablet (40 mg total) by mouth daily. 30 tablet  3  . polyethylene glycol powder (GLYCOLAX/MIRALAX) powder MIX 1 CAPFUL TWICE DAILY AS NEEDED. 250 g 0  . ranitidine (ZANTAC) 150 MG tablet Take 1 tablet (150 mg total) by mouth 2 (two) times daily. 60 tablet 0  . rosuvastatin (CRESTOR) 40 MG tablet TAKE 1 TABLET DAILY. 90 tablet 3  . sildenafil (REVATIO) 20 MG tablet Take by mouth.    . spironolactone (ALDACTONE) 25 MG tablet TAKE (1/2) TABLET DAILY. 45 tablet 0  . sucralfate (CARAFATE) 1 g tablet Take 1 tablet (1 g total) by mouth 4 (four) times daily -  with meals and at bedtime. 120 tablet 0  . tamsulosin (FLOMAX) 0.4 MG CAPS capsule Take 1 capsule (0.4 mg total) by mouth daily after breakfast. 90 capsule 0  . testosterone cypionate (DEPOTESTOTERONE CYPIONATE) 200 MG/ML injection Inject into the muscle every 14 (fourteen) days.      Marland Kitchen torsemide (DEMADEX) 20 MG tablet Take 1 tablet (20 mg total) by mouth daily as needed. For swelling in legs 90 tablet 2  . traZODone (DESYREL) 100 MG tablet Take 100 mg by mouth at bedtime as needed for sleep.     Marland Kitchen triamcinolone (NASACORT AQ) 55 MCG/ACT AERO nasal  inhaler Place 1 spray into the nose daily. 1 Inhaler 12  . vancomycin (VANCOCIN) 125 MG capsule Take 1 capsule (125 mg total) by mouth 4 (four) times daily. 28 capsule 0  . VIAGRA 100 MG tablet TAKE 1 TABLET APPROXIMATELY 1 HOUR BEFORE NEEDING AS DIRECTED. 4 tablet 0  . zolpidem (AMBIEN) 10 MG tablet Take 1 tablet by mouth at bedtime as needed for sleep.   4   Current Facility-Administered Medications on File Prior to Visit  Medication Dose Route Frequency Provider Last Rate Last Dose  . levalbuterol (XOPENEX) nebulizer solution 0.63 mg  0.63 mg Nebulization Once Wardell Honour, MD       Social History   Socioeconomic History  . Marital status: Married    Spouse name: Not on file  . Number of children: 3  . Years of education: Not on file  . Highest education level: Not on file  Occupational History  . Not on file  Social Needs  . Financial resource strain: Not on file  . Food insecurity:    Worry: Not on file    Inability: Not on file  . Transportation needs:    Medical: Not on file    Non-medical: Not on file  Tobacco Use  . Smoking status: Current Every Day Smoker    Packs/day: 0.50    Years: 29.00    Pack years: 14.50    Types: Cigarettes, E-cigarettes    Last attempt to quit: 07/14/2015    Years since quitting: 2.4  . Smokeless tobacco: Never Used  Substance and Sexual Activity  . Alcohol use: No    Alcohol/week: 0.0 oz  . Drug use: Yes    Types: Marijuana    Comment: Urine showed THC  . Sexual activity: Not on file  Lifestyle  . Physical activity:    Days per week: Not on file    Minutes per session: Not on file  . Stress: Not on file  Relationships  . Social connections:    Talks on phone: Not on file    Gets together: Not on file    Attends religious service: Not on file    Active member of club or organization: Not on file    Attends meetings of clubs or organizations: Not on file  Relationship status: Not on file  . Intimate partner violence:     Fear of current or ex partner: Not on file    Emotionally abused: Not on file    Physically abused: Not on file    Forced sexual activity: Not on file  Other Topics Concern  . Not on file  Social History Narrative   Marital status: married x 12 years; wife is severe alcoholic.      Children: 3 married daughters (23, 71, 39); 1 son (2yo); 3 grandchildren born in 2017      Lives: with wife, son      Left-handed   Caffeine: 3 drinks per day   Family History  Problem Relation Age of Onset  . Heart disease Father 23       Died of MI  . Cancer Mother        Objective:    BP (!) 142/80   Pulse 80   Temp 98 F (36.7 C) (Oral)   Resp 16   Wt 201 lb (91.2 kg)   SpO2 96%   BMI 28.03 kg/m  Physical Exam  Constitutional: He is oriented to person, place, and time. He appears well-developed and well-nourished. No distress.  HENT:  Head: Normocephalic and atraumatic.  Right Ear: External ear normal.  Left Ear: External ear normal.  Nose: Nose normal.  Mouth/Throat: Oropharynx is clear and moist.  Eyes: Pupils are equal, round, and reactive to light. Conjunctivae and EOM are normal.  Neck: Normal range of motion. Neck supple. Carotid bruit is not present. No thyromegaly present.  Cardiovascular: Normal rate, regular rhythm, normal heart sounds and intact distal pulses. Exam reveals no gallop and no friction rub.  No murmur heard. Pulmonary/Chest: Effort normal and breath sounds normal. He has no wheezes. He has no rales.  Abdominal: Soft. Bowel sounds are normal. He exhibits no distension and no mass. There is no tenderness. There is no rebound and no guarding.  Musculoskeletal: He exhibits no edema.  Lymphadenopathy:    He has no cervical adenopathy.  Neurological: He is alert and oriented to person, place, and time. He has normal strength. No cranial nerve deficit. Coordination and gait abnormal.  Skin: Skin is warm and dry. No rash noted. He is not diaphoretic.  Psychiatric: He  has a normal mood and affect. His behavior is normal.  Nursing note and vitals reviewed.  No results found. Depression screen [facility] 2/9 12/22/2017 12/10/2017 11/29/2017 11/24/2017 11/22/2017  Decreased Interest 1 0 0 0 1  Down, Depressed, Hopeless 1 0 0 0 1  PHQ - 2 Score 2 0 0 0 2  Altered sleeping 0 - - - -  Tired, decreased energy 0 - - - -  Change in appetite 0 - - - -  Feeling bad or failure about yourself  0 - - - -  Trouble concentrating 0 - - - -  Moving slowly or fidgety/restless 0 - - - -  Suicidal thoughts 0 - - - -  PHQ-9 Score 2 - - - -  Some recent data might be hidden   Fall Risk  12/22/2017 12/10/2017 11/29/2017 11/24/2017 11/22/2017  Falls in the past year? Yes Yes Yes No No  Comment - - - - -  Number falls in past yr: 2 or more 2 or more 2 or more - 2 or more  Comment - - - - -  Injury with Fall? No No No - Yes  Comment - - - - -  Risk Factor Category  - - - - -  Risk for fall due to : - - - - -  Risk for fall due to: Comment - - - - -  Follow up - - - - -  Comment - - - - -        Assessment & Plan:   1. C. difficile colitis   2. Psychogenic vomiting with nausea   3. Atherosclerosis of native coronary artery of native heart without angina pectoris   4. Unsteady gait   5. Neurogenic claudication due to lumbar spinal stenosis   6. PAD (peripheral artery disease) ([place])     C. Difficile colitis: resolved.  Nausea with vomiting: persistent; imaging facility rescheduled CT abdomen and pelvis.  Obtain labs.  Continue Protonix and Carafate. Benign abdominal exam; awaiting GI consultation.  Constipation drug induced: chronic; stable on Lactulose.  Unsteady gait with lumbar stenosis: New onset; having significant lower back pain with bilateral  leg weakness with ambulation.    Refer to neurology due to unsteady gait.   Increase Gabapentin to tid.  Obtain lumbar spine films.  PAD:  Highly recommend follow-up with cardiology as well to rule out claudication with known  PAD.  Referral back to cardiology placed.    Orders Placed This Encounter  Procedures  . DG Lumbar Spine Complete    Standing Status:   Future    Standing Expiration Date:   12/22/2018    Order Specific Question:   Reason for Exam (SYMPTOM  OR DIAGNOSIS REQUIRED)    Answer:   lower back pain; DDD lumbar spine    Order Specific Question:   Preferred imaging location?    Answer:   External  . DG HIP UNILAT W OR W/O PELVIS 2-3 VIEWS RIGHT    Standing Status:   Future    Standing Expiration Date:   12/22/2018    Order Specific Question:   Reason for Exam (SYMPTOM  OR DIAGNOSIS REQUIRED)    Answer:   R hip pain; lower back pain    Order Specific Question:   Preferred imaging location?    Answer:   External  . CBC with Differential/Platelet  . Comprehensive metabolic panel  . Vitamin B12  . Ambulatory referral to Neurology    Referral Priority:   Routine    Referral Type:   Consultation    Referral Reason:   Specialty Services Required    Requested Specialty:   Neurology    Number of Visits Requested:   1  . Ambulatory referral to Cardiology    Referral Priority:   Routine    Referral Type:   Consultation    Referral Reason:   Specialty Services Required    Requested Specialty:   Cardiology    Number of Visits Requested:   1   Meds ordered this encounter  Medications  . gabapentin (NEURONTIN) 300 MG capsule    Sig: Take 2 capsules (600 mg total) by mouth 3 (three) times daily.    Dispense:  180 capsule    Refill:  3    No follow-ups on file.   Craven Crean Elayne Guerin, M.D. Primary Care at [facility] [address] [place], [place]  [address] [phone] phone [phone] fax

## 2017-12-23 LAB — COMPREHENSIVE METABOLIC PANEL
ALT: 16 IU/L (ref 0–44)
AST: 19 IU/L (ref 0–40)
Albumin/Globulin Ratio: 1.8 (ref 1.2–2.2)
Albumin: 4.2 g/dL (ref 3.5–5.5)
Alkaline Phosphatase: 73 IU/L (ref 39–117)
BUN/Creatinine Ratio: 10 (ref 9–20)
BUN: 10 mg/dL (ref 6–24)
Bilirubin Total: 0.3 mg/dL (ref 0.0–1.2)
CO2: 26 mmol/L (ref 20–29)
Calcium: 9.3 mg/dL (ref 8.7–10.2)
Chloride: 101 mmol/L (ref 96–106)
Creatinine, Ser: 0.97 mg/dL (ref 0.76–1.27)
GFR calc Af Amer: 99 mL/min/{1.73_m2} (ref >59)
GFR calc non Af Amer: 86 mL/min/{1.73_m2} (ref >59)
Globulin, Total: 2.4 g/dL (ref 1.5–4.5)
Glucose: 109 mg/dL — ABNORMAL HIGH (ref 65–99)
Potassium: 4.3 mmol/L (ref 3.5–5.2)
Sodium: 143 mmol/L (ref 134–144)
Total Protein: 6.6 g/dL (ref 6.0–8.5)

## 2017-12-23 LAB — CBC WITH DIFFERENTIAL/PLATELET
Basophils Absolute: 0 10*3/uL (ref 0.0–0.2)
Basos: 0 %
EOS (ABSOLUTE): 0.3 10*3/uL (ref 0.0–0.4)
Eos: 4 %
Hematocrit: 46.9 % (ref 37.5–51.0)
Hemoglobin: 16.1 g/dL (ref 13.0–17.7)
Immature Grans (Abs): 0 10*3/uL (ref 0.0–0.1)
Immature Granulocytes: 0 %
Lymphocytes Absolute: 1.8 10*3/uL (ref 0.7–3.1)
Lymphs: 24 %
MCH: 33 pg (ref 26.6–33.0)
MCHC: 34.3 g/dL (ref 31.5–35.7)
MCV: 96 fL (ref 79–97)
Monocytes Absolute: 0.6 10*3/uL (ref 0.1–0.9)
Monocytes: 8 %
Neutrophils Absolute: 4.9 10*3/uL (ref 1.4–7.0)
Neutrophils: 64 %
Platelets: 165 10*3/uL (ref 150–450)
RBC: 4.88 x10E6/uL (ref 4.14–5.80)
RDW: 15.5 % — ABNORMAL HIGH (ref 12.3–15.4)
WBC: 7.6 10*3/uL (ref 3.4–10.8)

## 2017-12-23 LAB — VITAMIN B12: Vitamin B-12: 297 pg/mL (ref 232–1245)

## 2017-12-28 ENCOUNTER — Telehealth: Payer: Self-pay | Admitting: Hematology and Oncology

## 2017-12-28 ENCOUNTER — Telehealth: Payer: Self-pay | Admitting: Family Medicine

## 2017-12-28 NOTE — Telephone Encounter (Signed)
Pt. called to discuss lab results; stated he actually looked them up on line.  Questioned if Dr. Tamala Julian feels that it would be helpful to receive B12 injections either more frequently, or at a higher dose?  Stated that he could hardly walk this morning, due to weakness in his legs.  Advised will send this message to Dr. Tamala Julian.  Pt. Agreed with plan.

## 2017-12-28 NOTE — Telephone Encounter (Signed)
Tried to call regarding voicemail  °

## 2017-12-29 ENCOUNTER — Telehealth: Payer: Self-pay | Admitting: Hematology and Oncology

## 2017-12-29 ENCOUNTER — Ambulatory Visit
Admission: RE | Admit: 2017-12-29 | Discharge: 2017-12-29 | Disposition: A | Discharge status: Home / Self Care | Payer: BLUE CROSS/BLUE SHIELD | Source: Ambulatory Visit | Attending: Family Medicine | Admitting: Family Medicine

## 2017-12-29 DIAGNOSIS — R3129 Other microscopic hematuria: Secondary | ICD-10-CM | POA: Diagnosis not present

## 2017-12-29 DIAGNOSIS — R1115 Cyclical vomiting syndrome unrelated to migraine: Secondary | ICD-10-CM

## 2017-12-29 MED ORDER — IOHEXOL 300 MG/ML  SOLN
100.0000 mL | Freq: Once | INTRAMUSCULAR | Status: AC | PRN
  Administered 2017-12-29: 100 mL via INTRAVENOUS

## 2017-12-29 NOTE — Telephone Encounter (Signed)
Returned call to patient and explained that I was awaiting response from another provider regarding his f/u appointment after CT Bone Marrow Bx/ per note from [facility] 6/19

## 2017-12-30 ENCOUNTER — Other Ambulatory Visit: Payer: Self-pay | Admitting: Radiology

## 2017-12-30 ENCOUNTER — Telehealth: Payer: Self-pay | Admitting: Oncology

## 2017-12-30 NOTE — Telephone Encounter (Signed)
Called and left voicemail for patient regarding upcoming appointment with Dr Alen Blew per 6/19 staff message/  I also notified Alphonzo Grieve about this appointment

## 2017-12-31 ENCOUNTER — Encounter (HOSPITAL_COMMUNITY): Payer: Self-pay

## 2017-12-31 ENCOUNTER — Ambulatory Visit (HOSPITAL_COMMUNITY)
Admission: RE | Admit: 2017-12-31 | Discharge: 2017-12-31 | Disposition: A | Discharge status: Home / Self Care | Payer: BLUE CROSS/BLUE SHIELD | Source: Ambulatory Visit | Attending: Hematology and Oncology | Admitting: Hematology and Oncology

## 2017-12-31 DIAGNOSIS — N400 Enlarged prostate without lower urinary tract symptoms: Secondary | ICD-10-CM | POA: Diagnosis not present

## 2017-12-31 DIAGNOSIS — Z8619 Personal history of other infectious and parasitic diseases: Secondary | ICD-10-CM | POA: Diagnosis not present

## 2017-12-31 DIAGNOSIS — M5136 Other intervertebral disc degeneration, lumbar region: Secondary | ICD-10-CM | POA: Insufficient documentation

## 2017-12-31 DIAGNOSIS — K5090 Crohn's disease, unspecified, without complications: Secondary | ICD-10-CM | POA: Insufficient documentation

## 2017-12-31 DIAGNOSIS — I5022 Chronic systolic (congestive) heart failure: Secondary | ICD-10-CM | POA: Insufficient documentation

## 2017-12-31 DIAGNOSIS — F17210 Nicotine dependence, cigarettes, uncomplicated: Secondary | ICD-10-CM | POA: Insufficient documentation

## 2017-12-31 DIAGNOSIS — Z79899 Other long term (current) drug therapy: Secondary | ICD-10-CM | POA: Insufficient documentation

## 2017-12-31 DIAGNOSIS — M549 Dorsalgia, unspecified: Secondary | ICD-10-CM | POA: Insufficient documentation

## 2017-12-31 DIAGNOSIS — D751 Secondary polycythemia: Secondary | ICD-10-CM | POA: Diagnosis not present

## 2017-12-31 DIAGNOSIS — F419 Anxiety disorder, unspecified: Secondary | ICD-10-CM | POA: Diagnosis not present

## 2017-12-31 DIAGNOSIS — I2510 Atherosclerotic heart disease of native coronary artery without angina pectoris: Secondary | ICD-10-CM | POA: Insufficient documentation

## 2017-12-31 DIAGNOSIS — I700 Atherosclerosis of aorta: Secondary | ICD-10-CM | POA: Insufficient documentation

## 2017-12-31 DIAGNOSIS — F329 Major depressive disorder, single episode, unspecified: Secondary | ICD-10-CM | POA: Diagnosis not present

## 2017-12-31 DIAGNOSIS — Z9079 Acquired absence of other genital organ(s): Secondary | ICD-10-CM | POA: Diagnosis not present

## 2017-12-31 DIAGNOSIS — Z7982 Long term (current) use of aspirin: Secondary | ICD-10-CM | POA: Diagnosis not present

## 2017-12-31 DIAGNOSIS — Z95810 Presence of automatic (implantable) cardiac defibrillator: Secondary | ICD-10-CM | POA: Diagnosis not present

## 2017-12-31 DIAGNOSIS — M48061 Spinal stenosis, lumbar region without neurogenic claudication: Secondary | ICD-10-CM | POA: Insufficient documentation

## 2017-12-31 DIAGNOSIS — A0472 Enterocolitis due to Clostridium difficile, not specified as recurrent: Secondary | ICD-10-CM | POA: Diagnosis not present

## 2017-12-31 DIAGNOSIS — G8929 Other chronic pain: Secondary | ICD-10-CM | POA: Diagnosis not present

## 2017-12-31 DIAGNOSIS — Z8547 Personal history of malignant neoplasm of testis: Secondary | ICD-10-CM | POA: Diagnosis not present

## 2017-12-31 DIAGNOSIS — R718 Other abnormality of red blood cells: Secondary | ICD-10-CM | POA: Diagnosis not present

## 2017-12-31 DIAGNOSIS — F1120 Opioid dependence, uncomplicated: Secondary | ICD-10-CM | POA: Insufficient documentation

## 2017-12-31 LAB — CBC WITH DIFFERENTIAL/PLATELET
Basophils Absolute: 0 10*3/uL (ref 0.0–0.1)
Basophils Relative: 0 %
Eosinophils Absolute: 0.4 10*3/uL (ref 0.0–0.7)
Eosinophils Relative: 6 %
HCT: 49 % (ref 39.0–52.0)
Hemoglobin: 16.9 g/dL (ref 13.0–17.0)
Lymphocytes Relative: 26 %
Lymphs Abs: 1.7 10*3/uL (ref 0.7–4.0)
MCH: 34.3 pg — ABNORMAL HIGH (ref 26.0–34.0)
MCHC: 34.5 g/dL (ref 30.0–36.0)
MCV: 99.6 fL (ref 78.0–100.0)
Monocytes Absolute: 0.6 10*3/uL (ref 0.1–1.0)
Monocytes Relative: 9 %
Neutro Abs: 4.1 10*3/uL (ref 1.7–7.7)
Neutrophils Relative %: 59 %
Platelets: 166 10*3/uL (ref 150–400)
RBC: 4.92 MIL/uL (ref 4.22–5.81)
RDW: 14.7 % (ref 11.5–15.5)
WBC: 6.8 10*3/uL (ref 4.0–10.5)

## 2017-12-31 LAB — PROTIME-INR
INR: 1.04
Prothrombin Time: 13.6 seconds (ref 11.4–15.2)

## 2017-12-31 MED ORDER — HYDROCODONE-ACETAMINOPHEN 5-325 MG PO TABS: 1.0000 | ORAL_TABLET | ORAL | Status: DC | PRN

## 2017-12-31 MED ORDER — SODIUM CHLORIDE 0.9 % IV SOLN
INTRAVENOUS | Status: DC
  Administered 2017-12-31: 10:00:00 via INTRAVENOUS

## 2017-12-31 MED ORDER — LIDOCAINE HCL (PF) 1 % IJ SOLN
INTRAMUSCULAR | Status: AC | PRN
  Administered 2017-12-31: 30 mL

## 2017-12-31 MED ORDER — FENTANYL CITRATE (PF) 100 MCG/2ML IJ SOLN
INTRAMUSCULAR | Status: AC | PRN
  Administered 2017-12-31 (×2): 50 ug via INTRAVENOUS

## 2017-12-31 MED ORDER — MIDAZOLAM HCL 2 MG/2ML IJ SOLN
INTRAMUSCULAR | Status: AC | PRN
  Administered 2017-12-31 (×2): 1 mg via INTRAVENOUS

## 2017-12-31 MED ORDER — FENTANYL CITRATE (PF) 100 MCG/2ML IJ SOLN
INTRAMUSCULAR | Status: AC
  Filled 2017-12-31: qty 2

## 2017-12-31 MED ORDER — MIDAZOLAM HCL 2 MG/2ML IJ SOLN
INTRAMUSCULAR | Status: AC
  Filled 2017-12-31: qty 4

## 2017-12-31 NOTE — Procedures (Signed)
  Procedure: CT bone marrow biopsy R iliac EBL:   minimal Complications:  none immediate  See full dictation in [facility].  Dillard Cannon MD Main # [phone] Pager  [phone]

## 2017-12-31 NOTE — Discharge Instructions (Signed)
Moderate Conscious Sedation, Adult, Care After These instructions provide you with information about caring for yourself after your procedure. Your health care provider may also give you more specific instructions. Your treatment has been planned according to current medical practices, but problems sometimes occur. Call your health care provider if you have any problems or questions after your procedure. What can I expect after the procedure? After your procedure, it is common:  To feel sleepy for several hours.  To feel clumsy and have poor balance for several hours.  To have poor judgment for several hours.  To vomit if you eat too soon.  Follow these instructions at home: For at least 24 hours after the procedure:   Do not: ? Participate in activities where you could fall or become injured. ? Drive. ? Use heavy machinery. ? Drink alcohol. ? Take sleeping pills or medicines that cause drowsiness. ? Make important decisions or sign legal documents. ? Take care of children on your own.  Rest. Eating and drinking  Follow the diet recommended by your health care provider.  If you vomit: ? Drink water, juice, or soup when you can drink without vomiting. ? Make sure you have little or no nausea before eating solid foods. General instructions  Have a responsible adult stay with you until you are awake and alert.  Take over-the-counter and prescription medicines only as told by your health care provider.  If you smoke, do not smoke without supervision.  Keep all follow-up visits as told by your health care provider. This is important. Contact a health care provider if:  You keep feeling nauseous or you keep vomiting.  You feel light-headed.  You develop a rash.  You have a fever. Get help right away if:  You have trouble breathing. This information is not intended to replace advice given to you by your health care provider. Make sure you discuss any questions you have  with your health care provider. Document Released: 04/19/2013 Document Revised: 12/02/2015 Document Reviewed: 10/19/2015 Elsevier Interactive Patient Education  2018 [place].   Bone Marrow Aspiration and Bone Marrow Biopsy, Adult, Care After This sheet gives you information about how to care for yourself after your procedure. Your health care provider may also give you more specific instructions. If you have problems or questions, contact your health care provider. What can I expect after the procedure? After the procedure, it is common to have:  Mild pain and tenderness.  Swelling.  Bruising.  Follow these instructions at home:  Take over-the-counter or prescription medicines only as told by your health care provider.  Do not take baths, swim, or use a hot tub until your health care provider approves. Ask if you can take a shower or have a sponge bath.  You may shower tomorrow.  Follow instructions from your health care provider about how to take care of the puncture site. Make sure you: ? Wash your hands with soap and water before you change your bandage (dressing). If soap and water are not available, use hand sanitizer. ? Change your dressing as told by your health care provider.  You may remove your dressing tomorrow.  Check your puncture siteevery day for signs of infection. Check for: ? More redness, swelling, or pain. ? More fluid or blood. ? Warmth. ? Pus or a bad smell.  Return to your normal activities as told by your health care provider. Ask your health care provider what activities are safe for you.  Do not drive  for 24 hours if you were given a medicine to help you relax (sedative). °· Keep all follow-up visits as told by your health care provider. This is important. °Contact a health care provider if: °· You have more redness, swelling, or pain around the puncture site. °· You have more fluid or blood coming from the puncture site. °· Your puncture site feels  warm to the touch. °· You have pus or a bad smell coming from the puncture site. °· You have a fever. °· Your pain is not controlled with medicine. °This information is not intended to replace advice given to you by your health care provider. Make sure you discuss any questions you have with your health care provider. °Document Released: 01/16/2005 Document Revised: 01/17/2016 Document Reviewed: 12/11/2015 °Elsevier Interactive Patient Education © 2018 [employer]. ° °

## 2017-12-31 NOTE — Consult Note (Signed)
Chief Complaint: Patient was seen in consultation today for CT-guided bone marrow biopsy  Referring Physician(s): Perlov,Mikhail G  Supervising Physician: Arne Cleveland  Patient Status: [facility] - Out-pt  History of Present Illness: [name] is a 57 y.o. male smoker  with history of chronic elevation of hemoglobin and hematocrit/moderate erythrocytosis who presents today for CT-guided bone marrow biopsy for further evaluation/rule out MPN.  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 difficile.  Past Medical History:  Diagnosis Date  . Anxiety   . Automatic implantable cardiac defibrillator [employer]    Analyze ST  . Benign prostatic hypertrophy   . Benzodiazepine dependence (HCC)    chronic  . CAD (coronary artery disease)    Last cath 2/12. 3-v CAD. Failed PCI of distal RCAc  CATH [facility] 4/13 with DES to  LAD X2  . CHF (congestive heart failure) ([place])   . Chronic back pain    lumbar stenosis  . Chronic systolic heart failure (HCC)    EF 20-25%. s/p ST. Jude ICD  . Depression   . DJD (degenerative joint disease)   . History of testicular cancer   . Narcotic dependence (HCC)    chronic  . Vitamin B12 deficiency 07/22/2016    Past Surgical History:  Procedure Laterality Date  . ASD REPAIR, SINUS VENOSUS    . CARDIAC DEFIBRILLATOR PLACEMENT  08/2010  . HERNIA REPAIR    . LEFT HEART CATHETERIZATION WITH CORONARY ANGIOGRAM N/A 06/14/2014   Procedure: LEFT HEART CATHETERIZATION WITH CORONARY ANGIOGRAM;  Surgeon: Jolaine Artist, MD;  Location: [facility] CATH LAB;  Service: Cardiovascular;  Laterality: N/A;  . TESTICLE SURGERY     testicular cancer surgery    Allergies: Darvocet [propoxyphene n-acetaminophen] and Propoxyphene  Medications: Prior to Admission medications   Medication Sig Start Date End Date Taking? Authorizing Provider  albuterol  (VENTOLIN HFA) 108 (90 Base) MCG/ACT inhaler Inhale 2 puffs into the lungs every 6 (six) hours as needed for wheezing or shortness of breath. 11/10/17   Wardell Honour, MD  alfuzosin (UROXATRAL) 10 MG 24 hr tablet Take 10 mg by mouth as needed. For UTI 03/17/13   [provider]  ALPRAZolam Duanne Moron) 1 MG tablet Take by mouth.    [provider]  aspirin 325 MG tablet Take 325 mg by mouth daily.    [provider]  carvedilol (COREG) 25 MG tablet TAKE 1 TABLET TWICE DAILY WITH A MEAL. 05/07/17   Bensimhon, Shaune Pascal, MD  clopidogrel (PLAVIX) 75 MG tablet TAKE 1 TABLET EACH DAY. 10/20/17   Bensimhon, Shaune Pascal, MD  cyanocobalamin (,VITAMIN B-12,) 1000 MCG/ML injection Inject 1 mL (1,000 mcg total) into the muscle every 30 (thirty) days. 11/17/17   Wardell Honour, MD  diazepam (VALIUM) 10 MG tablet  06/04/17   [provider]  ezetimibe (ZETIA) 10 MG tablet TAKE 1 TABLET EACH DAY. 05/07/17   Bensimhon, Shaune Pascal, MD  gabapentin (NEURONTIN) 300 MG capsule Take 2 capsules (600 mg total) by mouth 3 (three) times daily. 12/22/17   Wardell Honour, MD  lactulose (CHRONULAC) 10 GM/15ML solution Take 15 mLs (10 g total) by mouth 2 (two) times daily as needed for mild constipation. 11/29/17   Wardell Honour, MD  lisinopril (PRINIVIL,ZESTRIL) 5 MG tablet Take 1 tablet (5 mg total) by mouth 2 (two) times daily. 10/20/17   Bensimhon, Shaune Pascal, MD  LORazepam (ATIVAN) 1 MG tablet Take 1 mg by mouth 4 (four) times daily as needed for anxiety or sleep. For anxiety    [provider]  morphine (MSIR) 30 MG tablet Take 1 tablet (30 mg total) by mouth every 6 (six) hours as needed for severe pain. 11/10/16   Kirsteins, Luanna Salk, MD  mupirocin ointment (BACTROBAN) 2 % Apply 1 application topically 2 (two) times daily. 01/25/17   Tereasa Coop, PA-C  naproxen (NAPROSYN) 500 MG tablet Take 1 tablet (500 mg total) by mouth 2 (two) times daily. 07/03/17   Khatri, Hina, PA-C  nitroGLYCERIN  (NITROLINGUAL) 0.4 MG/SPRAY spray Place under the tongue. 03/20/15   [provider]  ondansetron (ZOFRAN-ODT) 8 MG disintegrating tablet Take 1 tablet (8 mg total) by mouth every 8 (eight) hours as needed for nausea or vomiting. 11/29/17   Wardell Honour, MD  pantoprazole (PROTONIX) 40 MG tablet Take 1 tablet (40 mg total) by mouth daily. 11/22/17   Wardell Honour, MD  polyethylene glycol powder (GLYCOLAX/MIRALAX) powder MIX 1 CAPFUL TWICE DAILY AS NEEDED. 06/15/15   Copland, [name] Filler, MD  ranitidine (ZANTAC) 150 MG tablet Take 1 tablet (150 mg total) by mouth 2 (two) times daily. 11/10/17   Wardell Honour, MD  rosuvastatin (CRESTOR) 40 MG tablet TAKE 1 TABLET DAILY. 05/07/17   Bensimhon, Shaune Pascal, MD  sildenafil (REVATIO) 20 MG tablet Take by mouth. 11/01/17 11/01/18  [provider]  spironolactone (ALDACTONE) 25 MG tablet TAKE (1/2) TABLET DAILY. 10/20/17   Bensimhon, Shaune Pascal, MD  sucralfate (CARAFATE) 1 g tablet Take 1 tablet (1 g total) by mouth 4 (four) times daily -  with meals and at bedtime. 12/10/17   Wardell Honour, MD  tamsulosin [facility]) 0.4 MG CAPS capsule Take 1 capsule (0.4 mg total) by mouth daily after breakfast. 11/22/17   Wardell Honour, MD  testosterone cypionate (DEPOTESTOTERONE CYPIONATE) 200 MG/ML injection Inject into the muscle every 14 (fourteen) days.      [provider]  torsemide (DEMADEX) 20 MG tablet Take 1 tablet (20 mg total) by mouth daily as needed. For swelling in legs 10/07/15   Bensimhon, Shaune Pascal, MD  traZODone (DESYREL) 100 MG tablet Take 100 mg by mouth at bedtime as needed for sleep.     [provider]  triamcinolone (NASACORT AQ) 55 MCG/ACT AERO nasal inhaler Place 1 spray into the nose daily. 04/24/16   Chesley Mires, MD  vancomycin (VANCOCIN) 125 MG capsule Take 1 capsule (125 mg total) by mouth 4 (four) times daily. 12/10/17   Wardell Honour, MD  VIAGRA 100 MG tablet TAKE 1 TABLET APPROXIMATELY 1 HOUR BEFORE NEEDING AS  DIRECTED. 07/24/16   Bensimhon, Shaune Pascal, MD  zolpidem (AMBIEN) 10 MG tablet Take 1 tablet by mouth at bedtime as needed for sleep.  09/15/14   [provider]     Family History  Problem Relation Age of Onset  . Heart disease Father 64       Died of MI  . Cancer Mother     Social History   Socioeconomic History  . Marital status: Married    Spouse name: Not on file  . Number of children: 3  . Years of education: Not on file  . Highest education level: Not on file  Occupational History  . Not on file  Social Needs  . Financial resource strain: Not on file  . Food insecurity:    Worry: Not on  file    Inability: Not on file  . Transportation needs:    Medical: Not on file    Non-medical: Not on file  Tobacco Use  . Smoking status: Current Every Day Smoker    Packs/day: 0.50    Years: 29.00    Pack years: 14.50    Types: Cigarettes, E-cigarettes    Last attempt to quit: 07/14/2015    Years since quitting: 2.4  . Smokeless tobacco: Never Used  Substance and Sexual Activity  . Alcohol use: No    Alcohol/week: 0.0 oz  . Drug use: Yes    Types: Marijuana    Comment: Urine showed THC  . Sexual activity: Not on file  Lifestyle  . Physical activity:    Days per week: Not on file    Minutes per session: Not on file  . Stress: Not on file  Relationships  . Social connections:    Talks on phone: Not on file    Gets together: Not on file    Attends religious service: Not on file    Active member of club or organization: Not on file    Attends meetings of clubs or organizations: Not on file    Relationship status: Not on file  Other Topics Concern  . Not on file  Social History Narrative   Marital status: married x 12 years; wife is severe alcoholic.      Children: 3 married daughters (23, 44, 82); 1 son (75yo); 3 grandchildren born in 2017      Lives: with wife, son      Left-handed   Caffeine: 3 drinks per day     Review of Systems currently denies fever,  headache, chest pain, dyspnea, cough, abdominal pain, recent nausea, vomiting or bleeding.  He does have difficulty walking/LE weakness and chronic back pain  Vital Signs: BP 135/87 (BP Location: Right Arm)   Pulse 64   Temp (!) 97.5 F (36.4 C) (Oral)   Resp 18   SpO2 95%   Physical Exam awake, alert.  Chest clear to auscultation bilaterally.  Left chest wall AICD noted; heart with regular rate and rhythm.  Abdomen soft, positive bowel sounds, nontender.  No lower extremity edema.  Imaging: Dg Lumbar Spine Complete  Result Date: 12/22/2017 CLINICAL DATA:  Low back pain. Degenerative disc disease in lumbar spine. EXAM: LUMBAR SPINE - COMPLETE 4+ VIEW COMPARISON:  CT AP 02/20/2014. FINDINGS: Mild lumbar scoliosis which is convex towards the left. There is marked degenerative disc disease at L4-5 and L5-S1. The vertebral body heights are well preserved. No acute fractures identified. Aortic atherosclerotic calcifications noted. IMPRESSION: 1. Scoliosis with marked L4-5 and L5-S1 degenerative disc disease. 2.  Aortic Atherosclerosis (ICD10-I70.0). Electronically Signed   By: Kerby Moors M.D.   On: 12/22/2017 17:57   Ct Abdomen Pelvis W Contrast  Result Date: 12/30/2017 CLINICAL DATA:  History of Crohn's disease and C difficile colitis. Intractable vomiting, nausea. Microhematuria. 40 lb weight loss. EXAM: CT ABDOMEN AND PELVIS WITH CONTRAST TECHNIQUE: Multidetector CT imaging of the abdomen and pelvis was performed using the standard protocol following bolus administration of intravenous contrast. CONTRAST:  151m OMNIPAQUE IOHEXOL 300 MG/ML  SOLN COMPARISON:  02/20/2014 FINDINGS: Lower chest: Lung bases are clear. No effusions. Heart is normal size. Pacer wires noted in the right heart. Diffusely calcified coronary arteries. Hepatobiliary: No focal hepatic abnormality. Gallbladder unremarkable. Pancreas: No focal abnormality or ductal dilatation. Spleen: No focal abnormality.  Normal size.  Adrenals/Urinary Tract: No adrenal  abnormality. No focal renal abnormality. No stones or hydronephrosis. Urinary bladder is unremarkable. Stomach/Bowel: Large stool burden in the colon. Appendix not definitively seen. No pericecal inflammation. No evidence of bowel obstruction. Vascular/Lymphatic: Diffuse aortic atherosclerosis. No evidence of aneurysm or adenopathy. Reproductive: No visible focal abnormality. Other: No free fluid or free air. Musculoskeletal: Degenerative disc and facet disease in the lower lumbar spine. No acute bony abnormality. IMPRESSION: Large stool burden throughout the. No evidence of bowel obstruction or active inflammation. Aortic atherosclerosis, coronary artery disease. No acute findings in the abdomen or pelvis. Electronically Signed   By: Rolm Baptise M.D.   On: 12/30/2017 08:01   Dg Abd Acute W/chest  Result Date: 12/10/2017 CLINICAL DATA:  Nausea with diarrhea. C difficile colitis last month. EXAM: DG ABDOMEN ACUTE W/ 1V CHEST COMPARISON:  Abdominal and chest radiographs 11/10/2017 FINDINGS: An ICD remains in place. The cardiomediastinal silhouette is unchanged and within normal limits. No airspace consolidation, edema, pleural effusion, or pneumothorax is identified. An old right sixth rib fracture is noted. No intraperitoneal free air is identified. A moderately large amount of stool is present in the colon. No dilated loops of bowel are seen to suggest obstruction. Mild lumbar levoscoliosis is noted. IMPRESSION: 1. Moderately large amount of colonic stool. No evidence of bowel obstruction. 2. No acute cardiopulmonary process. Electronically Signed   By: Logan Bores M.D.   On: 12/10/2017 15:06   Dg Hip Unilat W Or W/o Pelvis 2-3 Views Right  Result Date: 12/22/2017 CLINICAL DATA:  Right hip pain without known injury. EXAM: DG HIP (WITH OR WITHOUT PELVIS) 2-3V RIGHT COMPARISON:  None. FINDINGS: Degenerative changes in the lower lumbar spine. No hip fractures or dislocations  identified. Vascular calcifications are noted. IMPRESSION: No fracture or dislocation. Electronically Signed   By: Dorise Bullion III M.D   On: 12/22/2017 17:56    Labs:  CBC: Recent Labs    11/24/17 1903 11/29/17 1816 12/10/17 1440 12/22/17 1628  WBC 7.8 10.2 8.1 7.6  HGB 17.3 16.5 16.2 16.1  HCT 50.3 47.9 46.0 46.9  PLT 220 199 158 165    COAGS: Recent Labs    04/28/17 1141  INR 1.04    BMP: Recent Labs    11/24/17 1903 11/29/17 1816 12/10/17 1440 12/22/17 1628  NA 136 134 137 143  K 5.3* 4.9 4.5 4.3  CL 95* 92* 97 101  CO2 23 27 25 26   GLUCOSE 91 87 89 109*  BUN 13 9 7 10   CALCIUM 9.7 9.6 9.2 9.3  CREATININE 1.07 0.89 0.90 0.97  GFRNONAA 76 94 94 86  GFRAA 88 109 108 99    LIVER FUNCTION TESTS: Recent Labs    11/24/17 1903 11/29/17 1816 12/10/17 1440 12/22/17 1628  BILITOT 0.4 0.3 0.2 0.3  AST 20 18 15 19   ALT 12 14 16 16   ALKPHOS 77 74 74 73  PROT 7.4 6.7 6.3 6.6  ALBUMIN 4.6 4.4 4.0 4.2    TUMOR MARKERS: No results for input(s): AFPTM, CEA, CA199, CHROMGRNA in the last 8760 hours.  Assessment and Plan: 57 y.o. male smoker  with history of chronic elevation of hemoglobin and hematocrit/moderate erythrocytosis who presents today for CT-guided bone marrow biopsy for further evaluation/rule out MPN.  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 Difficile.Risks and benefits discussed with the patient including, but not limited to bleeding, infection, damage to adjacent structures or  low yield requiring additional tests.  All of the patient's questions were answered, patient is agreeable to proceed. Consent signed and in chart.  LABS PENDING   Thank you for this interesting consult.  I greatly enjoyed meeting [name] and look forward to participating in their care.  A copy of this report was sent to the requesting provider  on this date.  Electronically Signed: D. Rowe Robert, PA-C 12/31/2017, 9:39 AM   I spent a total of 20 minutes    in face to face in clinical consultation, greater than 50% of which was counseling/coordinating care for CT-guided bone marrow biopsy

## 2018-01-05 NOTE — Progress Notes (Deleted)
Subjective:    Patient ID: [name], male    DOB: 1960-12-25, 57 y.o.   MRN: [id number]  01/11/2018  No chief complaint on file.    HPI This 57 y.o. male presents for evaluation of ***. BP Readings from Last 3 Encounters:  12/31/17 131/85  12/22/17 (!) 142/80  12/10/17 112/70   Wt Readings from Last 3 Encounters:  12/22/17 201 lb (91.2 kg)  12/10/17 190 lb (86.2 kg)  11/29/17 192 lb (87.1 kg)   Immunization History  Administered Date(s) Administered  . Influenza Split 04/12/2013, 09/11/2015  . Influenza Whole 05/25/2012  . Influenza,inj,Quad PF,6+ Mos 06/11/2014, 09/25/2015, 05/11/2016, 07/01/2016, 06/14/2017  . Influenza-Unspecified 05/25/2012, 04/20/2013, 03/14/2017  . Pneumococcal Conjugate-13 06/11/2014  . Pneumococcal Polysaccharide-23 07/13/2013, 08/10/2013  . Td 08/10/2013    Review of Systems  Past Medical History:  Diagnosis Date  . Anxiety   . Automatic implantable cardiac defibrillator [employer] [facility]    Analyze ST  . Benign prostatic hypertrophy   . Benzodiazepine dependence (HCC)    chronic  . CAD (coronary artery disease)    Last cath 2/12. 3-v CAD. Failed PCI of distal RCAc  CATH [facility] 4/13 with DES to  LAD X2  . CHF (congestive heart failure) ([place])   . Chronic back pain    lumbar stenosis  . Chronic systolic heart failure (HCC)    EF 20-25%. s/p ST. Jude ICD  . Depression   . DJD (degenerative joint disease)   . History of testicular cancer   . Narcotic dependence (HCC)    chronic  . Vitamin B12 deficiency 07/22/2016   Past Surgical History:  Procedure Laterality Date  . ASD REPAIR, SINUS VENOSUS    . CARDIAC DEFIBRILLATOR PLACEMENT  08/2010  . HERNIA REPAIR    . LEFT HEART CATHETERIZATION WITH CORONARY ANGIOGRAM N/A 06/14/2014   Procedure: LEFT HEART CATHETERIZATION WITH CORONARY ANGIOGRAM;  Surgeon: Jolaine Artist, MD;  Location: [facility] CATH LAB;  Service: Cardiovascular;  Laterality: N/A;  . TESTICLE SURGERY     testicular cancer surgery     Allergies  Allergen Reactions  . Darvocet [Propoxyphene N-Acetaminophen] Anaphylaxis    Throat closes  . Propoxyphene Anaphylaxis and Swelling   Current Outpatient Medications on File Prior to Visit  Medication Sig Dispense Refill  . albuterol (VENTOLIN HFA) 108 (90 Base) MCG/ACT inhaler Inhale 2 puffs into the lungs every 6 (six) hours as needed for wheezing or shortness of breath. 18 g 0  . alfuzosin (UROXATRAL) 10 MG 24 hr tablet Take 10 mg by mouth as needed. For UTI    . ALPRAZolam (XANAX) 1 MG tablet Take by mouth.    Marland Kitchen aspirin 325 MG tablet Take 325 mg by mouth daily.    . carvedilol (COREG) 25 MG tablet TAKE 1 TABLET TWICE DAILY WITH A MEAL. 180 tablet 3  . clopidogrel (PLAVIX) 75 MG tablet TAKE 1 TABLET EACH DAY. 90 tablet 0  . cyanocobalamin (,VITAMIN B-12,) 1000 MCG/ML injection Inject 1 mL (1,000 mcg total) into the muscle every 30 (thirty) days. 10 mL 3  . diazepam (VALIUM) 10 MG tablet   2  . ezetimibe (ZETIA) 10 MG tablet TAKE 1 TABLET EACH DAY. 90 tablet 3  . gabapentin (NEURONTIN) 300 MG capsule Take 2 capsules (600 mg total) by mouth 3 (three) times daily. 180 capsule 3  . lactulose (CHRONULAC) 10 GM/15ML solution Take 15 mLs (10 g total) by mouth 2 (two) times daily as needed for mild constipation. 1892 mL  0  . lisinopril (PRINIVIL,ZESTRIL) 5 MG tablet Take 1 tablet (5 mg total) by mouth 2 (two) times daily. 180 tablet 0  . LORazepam (ATIVAN) 1 MG tablet Take 1 mg by mouth 4 (four) times daily as needed for anxiety or sleep. For anxiety    . morphine (MSIR) 30 MG tablet Take 1 tablet (30 mg total) by mouth every 6 (six) hours as needed for severe pain. 120 tablet 0  . mupirocin ointment (BACTROBAN) 2 % Apply 1 application topically 2 (two) times daily. 22 g 0  . naproxen (NAPROSYN) 500 MG tablet Take 1 tablet (500 mg total) by mouth 2 (two) times daily. 30 tablet 0  . nitroGLYCERIN (NITROLINGUAL) 0.4 MG/SPRAY spray Place under the tongue.    . ondansetron (ZOFRAN-ODT)  8 MG disintegrating tablet Take 1 tablet (8 mg total) by mouth every 8 (eight) hours as needed for nausea or vomiting. 20 tablet 1  . pantoprazole (PROTONIX) 40 MG tablet Take 1 tablet (40 mg total) by mouth daily. 30 tablet 3  . polyethylene glycol powder (GLYCOLAX/MIRALAX) powder MIX 1 CAPFUL TWICE DAILY AS NEEDED. 250 g 0  . ranitidine (ZANTAC) 150 MG tablet Take 1 tablet (150 mg total) by mouth 2 (two) times daily. 60 tablet 0  . rosuvastatin (CRESTOR) 40 MG tablet TAKE 1 TABLET DAILY. 90 tablet 3  . sildenafil (REVATIO) 20 MG tablet Take by mouth.    . spironolactone (ALDACTONE) 25 MG tablet TAKE (1/2) TABLET DAILY. 45 tablet 0  . sucralfate (CARAFATE) 1 g tablet Take 1 tablet (1 g total) by mouth 4 (four) times daily -  with meals and at bedtime. 120 tablet 0  . tamsulosin (FLOMAX) 0.4 MG CAPS capsule Take 1 capsule (0.4 mg total) by mouth daily after breakfast. 90 capsule 0  . testosterone cypionate (DEPOTESTOTERONE CYPIONATE) 200 MG/ML injection Inject into the muscle every 14 (fourteen) days.      Marland Kitchen torsemide (DEMADEX) 20 MG tablet Take 1 tablet (20 mg total) by mouth daily as needed. For swelling in legs 90 tablet 2  . traZODone (DESYREL) 100 MG tablet Take 100 mg by mouth at bedtime as needed for sleep.     Marland Kitchen triamcinolone (NASACORT AQ) 55 MCG/ACT AERO nasal inhaler Place 1 spray into the nose daily. 1 Inhaler 12  . vancomycin (VANCOCIN) 125 MG capsule Take 1 capsule (125 mg total) by mouth 4 (four) times daily. 28 capsule 0  . VIAGRA 100 MG tablet TAKE 1 TABLET APPROXIMATELY 1 HOUR BEFORE NEEDING AS DIRECTED. 4 tablet 0  . zolpidem (AMBIEN) 10 MG tablet Take 1 tablet by mouth at bedtime as needed for sleep.   4   Current Facility-Administered Medications on File Prior to Visit  Medication Dose Route Frequency Provider Last Rate Last Dose  . levalbuterol (XOPENEX) nebulizer solution 0.63 mg  0.63 mg Nebulization Once Wardell Honour, MD       Social History   Socioeconomic History   . Marital status: Married    Spouse name: Not on file  . Number of children: 3  . Years of education: Not on file  . Highest education level: Not on file  Occupational History  . Not on file  Social Needs  . Financial resource strain: Not on file  . Food insecurity:    Worry: Not on file    Inability: Not on file  . Transportation needs:    Medical: Not on file    Non-medical: Not on file  Tobacco Use  .  Smoking status: Current Every Day Smoker    Packs/day: 0.50    Years: 29.00    Pack years: 14.50    Types: Cigarettes, E-cigarettes    Last attempt to quit: 07/14/2015    Years since quitting: 2.4  . Smokeless tobacco: Never Used  Substance and Sexual Activity  . Alcohol use: No    Alcohol/week: 0.0 oz  . Drug use: Yes    Types: Marijuana    Comment: Urine showed THC  . Sexual activity: Not on file  Lifestyle  . Physical activity:    Days per week: Not on file    Minutes per session: Not on file  . Stress: Not on file  Relationships  . Social connections:    Talks on phone: Not on file    Gets together: Not on file    Attends religious service: Not on file    Active member of club or organization: Not on file    Attends meetings of clubs or organizations: Not on file    Relationship status: Not on file  . Intimate partner violence:    Fear of current or ex partner: Not on file    Emotionally abused: Not on file    Physically abused: Not on file    Forced sexual activity: Not on file  Other Topics Concern  . Not on file  Social History Narrative   Marital status: married x 12 years; wife is severe alcoholic.      Children: 3 married daughters (23, 2, 1); 1 son (24yo); 3 grandchildren born in 2017      Lives: with wife, son      Left-handed   Caffeine: 3 drinks per day   Family History  Problem Relation Age of Onset  . Heart disease Father 24       Died of MI  . Cancer Mother        Objective:    There were no vitals taken for this visit. Physical  Exam No results found. Depression screen [facility] 2/9 12/22/2017 12/10/2017 11/29/2017 11/24/2017 11/22/2017  Decreased Interest 1 0 0 0 1  Down, Depressed, Hopeless 1 0 0 0 1  PHQ - 2 Score 2 0 0 0 2  Altered sleeping 0 - - - -  Tired, decreased energy 0 - - - -  Change in appetite 0 - - - -  Feeling bad or failure about yourself  0 - - - -  Trouble concentrating 0 - - - -  Moving slowly or fidgety/restless 0 - - - -  Suicidal thoughts 0 - - - -  PHQ-9 Score 2 - - - -  Some recent data might be hidden   Fall Risk  12/22/2017 12/10/2017 11/29/2017 11/24/2017 11/22/2017  Falls in the past year? Yes Yes Yes No No  Comment - - - - -  Number falls in past yr: 2 or more 2 or more 2 or more - 2 or more  Comment - - - - -  Injury with Fall? No No No - Yes  Comment - - - - -  Risk Factor Category  - - - - -  Risk for fall due to : - - - - -  Risk for fall due to: Comment - - - - -  Follow up - - - - -  Comment - - - - -        Assessment & Plan:  No diagnosis found.  ***  No orders  of the defined types were placed in this encounter.  No orders of the defined types were placed in this encounter.   No follow-ups on file.   Kristi Elayne Guerin, M.D. Primary Care at [facility] previously [facility] [place] [address] [place], [place]  [phone] phone [phone] fax

## 2018-01-06 ENCOUNTER — Encounter (HOSPITAL_COMMUNITY): Payer: Self-pay | Admitting: Hematology and Oncology

## 2018-01-06 ENCOUNTER — Inpatient Hospital Stay: Payer: BLUE CROSS/BLUE SHIELD | Attending: Oncology | Admitting: Oncology

## 2018-01-06 VITALS — BP 131/71 | HR 64 | Temp 98.3°F | Resp 20 | Ht 71.0 in | Wt 211.8 lb

## 2018-01-06 DIAGNOSIS — Z79899 Other long term (current) drug therapy: Secondary | ICD-10-CM | POA: Diagnosis not present

## 2018-01-06 DIAGNOSIS — D751 Secondary polycythemia: Secondary | ICD-10-CM | POA: Diagnosis not present

## 2018-01-06 DIAGNOSIS — G4730 Sleep apnea, unspecified: Secondary | ICD-10-CM | POA: Diagnosis not present

## 2018-01-06 DIAGNOSIS — D582 Other hemoglobinopathies: Secondary | ICD-10-CM

## 2018-01-06 DIAGNOSIS — R0902 Hypoxemia: Secondary | ICD-10-CM | POA: Diagnosis not present

## 2018-01-06 DIAGNOSIS — F17218 Nicotine dependence, cigarettes, with other nicotine-induced disorders: Secondary | ICD-10-CM | POA: Diagnosis not present

## 2018-01-06 NOTE — Progress Notes (Signed)
Hematology and Oncology Follow Up Visit  [name] [id number] 11-04-1960 57 y.o. 01/06/2018 11:32 AM [name], MDSmith, Renette Butters, MD   Principle Diagnosis: 57 year old gentleman with polycythemia diagnosed in January 2019.  His differential diagnosis includes secondary causes related to sleep apnea versus primary polycythemia vera.   Current therapy: Active surveillance.  Interim History: [name] returns today for a follow-up.  Since the last visit, he underwent a bone marrow biopsy that was completed on 3/87/5643 without complications.  He has been seen by Dr. Lebron Sandoval for the evaluation of this condition and has completed his work-up at this time.  He continues to be reasonably active without any major complaints.  He does have nighttime hypoxia and chronically on oxygen.  He was also on testosterone supplements and continues to smoke.  He does not report any headaches, blurry vision, syncope or seizures. Does not report any fevers, chills or sweats.  Does not report any cough, wheezing or hemoptysis.  Does not report any chest pain, palpitation, orthopnea or leg edema.  Does not report any nausea, vomiting or abdominal pain.  Does not report any constipation or diarrhea.  Does not report any skeletal complaints.    Does not report frequency, urgency or hematuria.  Does not report any skin rashes or lesions. Does not report any heat or cold intolerance.  Does not report any lymphadenopathy or petechiae.  Does not report any anxiety or depression.  Remaining review of systems is negative.    Medications: I have reviewed the patient's current medications.  Current Outpatient Medications  Medication Sig Dispense Refill  . albuterol (VENTOLIN HFA) 108 (90 Base) MCG/ACT inhaler Inhale 2 puffs into the lungs every 6 (six) hours as needed for wheezing or shortness of breath. 18 g 0  . alfuzosin (UROXATRAL) 10 MG 24 hr tablet Take 10 mg by mouth as needed. For UTI    . ALPRAZolam (XANAX) 1  MG tablet Take by mouth.    Marland Kitchen aspirin 325 MG tablet Take 325 mg by mouth daily.    . carvedilol (COREG) 25 MG tablet TAKE 1 TABLET TWICE DAILY WITH A MEAL. 180 tablet 3  . clopidogrel (PLAVIX) 75 MG tablet TAKE 1 TABLET EACH DAY. 90 tablet 0  . cyanocobalamin (,VITAMIN B-12,) 1000 MCG/ML injection Inject 1 mL (1,000 mcg total) into the muscle every 30 (thirty) days. 10 mL 3  . diazepam (VALIUM) 10 MG tablet   2  . ezetimibe (ZETIA) 10 MG tablet TAKE 1 TABLET EACH DAY. 90 tablet 3  . gabapentin (NEURONTIN) 300 MG capsule Take 2 capsules (600 mg total) by mouth 3 (three) times daily. 180 capsule 3  . lactulose (CHRONULAC) 10 GM/15ML solution Take 15 mLs (10 g total) by mouth 2 (two) times daily as needed for mild constipation. 1892 mL 0  . lisinopril (PRINIVIL,ZESTRIL) 5 MG tablet Take 1 tablet (5 mg total) by mouth 2 (two) times daily. 180 tablet 0  . LORazepam (ATIVAN) 1 MG tablet Take 1 mg by mouth 4 (four) times daily as needed for anxiety or sleep. For anxiety    . morphine (MSIR) 30 MG tablet Take 1 tablet (30 mg total) by mouth every 6 (six) hours as needed for severe pain. 120 tablet 0  . mupirocin ointment (BACTROBAN) 2 % Apply 1 application topically 2 (two) times daily. 22 g 0  . naproxen (NAPROSYN) 500 MG tablet Take 1 tablet (500 mg total) by mouth 2 (two) times daily. 30 tablet 0  .  nitroGLYCERIN (NITROLINGUAL) 0.4 MG/SPRAY spray Place under the tongue.    . ondansetron (ZOFRAN-ODT) 8 MG disintegrating tablet Take 1 tablet (8 mg total) by mouth every 8 (eight) hours as needed for nausea or vomiting. 20 tablet 1  . pantoprazole (PROTONIX) 40 MG tablet Take 1 tablet (40 mg total) by mouth daily. 30 tablet 3  . polyethylene glycol powder (GLYCOLAX/MIRALAX) powder MIX 1 CAPFUL TWICE DAILY AS NEEDED. 250 g 0  . ranitidine (ZANTAC) 150 MG tablet Take 1 tablet (150 mg total) by mouth 2 (two) times daily. 60 tablet 0  . rosuvastatin (CRESTOR) 40 MG tablet TAKE 1 TABLET DAILY. 90 tablet 3  .  sildenafil (REVATIO) 20 MG tablet Take by mouth.    . spironolactone (ALDACTONE) 25 MG tablet TAKE (1/2) TABLET DAILY. 45 tablet 0  . sucralfate (CARAFATE) 1 g tablet Take 1 tablet (1 g total) by mouth 4 (four) times daily -  with meals and at bedtime. 120 tablet 0  . tamsulosin (FLOMAX) 0.4 MG CAPS capsule Take 1 capsule (0.4 mg total) by mouth daily after breakfast. 90 capsule 0  . testosterone cypionate (DEPOTESTOTERONE CYPIONATE) 200 MG/ML injection Inject into the muscle every 14 (fourteen) days.      Marland Kitchen torsemide (DEMADEX) 20 MG tablet Take 1 tablet (20 mg total) by mouth daily as needed. For swelling in legs 90 tablet 2  . traZODone (DESYREL) 100 MG tablet Take 100 mg by mouth at bedtime as needed for sleep.     Marland Kitchen triamcinolone (NASACORT AQ) 55 MCG/ACT AERO nasal inhaler Place 1 spray into the nose daily. 1 Inhaler 12  . vancomycin (VANCOCIN) 125 MG capsule Take 1 capsule (125 mg total) by mouth 4 (four) times daily. 28 capsule 0  . VIAGRA 100 MG tablet TAKE 1 TABLET APPROXIMATELY 1 HOUR BEFORE NEEDING AS DIRECTED. 4 tablet 0  . zolpidem (AMBIEN) 10 MG tablet Take 1 tablet by mouth at bedtime as needed for sleep.   4   Current Facility-Administered Medications  Medication Dose Route Frequency Provider Last Rate Last Dose  . levalbuterol (XOPENEX) nebulizer solution 0.63 mg  0.63 mg Nebulization Once Trevor Honour, MD         Allergies:  Allergies  Allergen Reactions  . Darvocet [Propoxyphene N-Acetaminophen] Anaphylaxis    Throat closes  . Propoxyphene Anaphylaxis and Swelling    Past Medical History, Surgical history, Social history, and Family History were reviewed and updated.    Physical Exam: Blood pressure 131/71, pulse 64, temperature 98.3 F (36.8 C), temperature source Oral, resp. rate 20, height 5' 11"  (1.803 m), weight 211 lb 12.8 oz (96.1 kg), SpO2 98 %.   ECOG: 0 General appearance: alert and cooperative appeared without distress. Head: Normocephalic, without  obvious abnormality Oropharynx: No oral thrush or ulcers. Eyes: No scleral icterus.  Pupils are equal and round reactive to light. Lymph nodes: Cervical, supraclavicular, and axillary nodes normal. Heart:regular rate and rhythm, S1, S2 normal, no murmur, click, rub or gallop Lung:chest clear, no wheezing, rales, normal symmetric air entry Abdomin: soft, non-tender, without masses or organomegaly. Neurological: No motor, sensory deficits.  Intact deep tendon reflexes. Skin: No rashes or lesions.  No ecchymosis or petechiae. Musculoskeletal: No joint deformity or effusion. Psychiatric: Mood and affect are appropriate.    Lab Results: Lab Results  Component Value Date   WBC 6.8 12/31/2017   HGB 16.9 12/31/2017   HCT 49.0 12/31/2017   MCV 99.6 12/31/2017   PLT 166 12/31/2017  Chemistry      Component Value Date/Time   NA 143 12/22/2017 1628   K 4.3 12/22/2017 1628   CL 101 12/22/2017 1628   CO2 26 12/22/2017 1628   BUN 10 12/22/2017 1628   CREATININE 0.97 12/22/2017 1628   CREATININE 0.97 08/26/2017 1152   CREATININE 0.97 12/04/2014 1644      Component Value Date/Time   CALCIUM 9.3 12/22/2017 1628   ALKPHOS 73 12/22/2017 1628   AST 19 12/22/2017 1628   AST 14 08/26/2017 1152   ALT 16 12/22/2017 1628   ALT 10 08/26/2017 1152   BILITOT 0.3 12/22/2017 1628   BILITOT 0.7 08/26/2017 1152        Impression and Plan:  57 year old man with the following:  1.  Polycythemia: This was detected in 2017 and in 2018 with a hemoglobin of about 17.8.  Most recent laboratory data on 12/31/2017 showed a hemoglobin of 16.9 which is within normal range.  His a work-up that did not reveal any molecular abnormality to suggest myeloproliferative disorder.  His bone marrow biopsy showed no evidence of myeloproliferative disorder.  BCR ABL mutation was not detected.  There is no detected to genomic alteration to suggest myeloproliferative disorder.  Cytogenetics from his bone marrow also  were normal.  Is on these findings, his polycythemia appears to be secondary in nature related to sleep apnea among other causes.  These causes including active smoking, testosterone supplements as well as nighttime hypoxia.  I see no evidence to suggest a blood disorder or any intervention at this time.  The level of elevation in his hemoglobin is rather mild and asymptomatic.  He does not require phlebotomy or any further hematology evaluation.  2.  Follow-up: We will be happy to see him in the future as needed.  15  minutes was spent with the patient face-to-face today.  More than 50% of time was dedicated to patient counseling, education and coordination of his care.     Zola Button, MD 6/27/201911:32 AM

## 2018-01-07 ENCOUNTER — Telehealth: Payer: Self-pay

## 2018-01-07 NOTE — Telephone Encounter (Signed)
Per 6/27 no los

## 2018-01-10 ENCOUNTER — Ambulatory Visit: Payer: BLUE CROSS/BLUE SHIELD | Admitting: Neurology

## 2018-01-10 ENCOUNTER — Encounter: Payer: Self-pay | Admitting: Neurology

## 2018-01-10 VITALS — BP 132/82 | HR 64 | Ht 72.0 in | Wt 205.0 lb

## 2018-01-10 DIAGNOSIS — M5416 Radiculopathy, lumbar region: Secondary | ICD-10-CM

## 2018-01-10 DIAGNOSIS — G4733 Obstructive sleep apnea (adult) (pediatric): Secondary | ICD-10-CM | POA: Diagnosis not present

## 2018-01-10 DIAGNOSIS — G4737 Central sleep apnea in conditions classified elsewhere: Secondary | ICD-10-CM | POA: Diagnosis not present

## 2018-01-10 NOTE — Progress Notes (Signed)
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  He has history of chronic pain and lumbar stenosis at L3-4 and L4-5.  Several years ago, he fell on his right hip and has had right hip pain and back pain since.  He was being treated for chronic pain with opiates.  He reports he has not been on opiates for about a year.  NCV-EMG of lower extremities on 10/08/16 showed bilateral L4, L5 and S1 radiculopathy as well as axonal polyneuropathy.  Lumbar radiographs from 12/22/17 was personally reviewed and demonstrated scoliosis with degenerative disc disease at L4-5 and L5-S1.  Radiographs of the right hip showed no fractures or dislocation.  CT abdomen and pelvis with contrast from 12/30/17 demonstrated no acute findings.  Labs from 11/10/17 showed normal sed rate, T4 and TSH, negative HIV.  B12 from 12/22/17 was 297.  4 weeks ago, he had a bout of C. Diff colitis.  Since then, he reports increased back and leg pain as well as weakness and difficulty walking.  He reports burning pain from the right lower lumbar region down the posterior leg to the knee.  He currently is taking gabapentin 600mg  three times daily   OTHER HISTORY: For several years, he has had transient episodes of staring spells.  He seems to "zone out" and has difficulty talking.  He may just repeat his wife's name.  Usually, he is conscious during these events but there were some episodes where he had no recollection.  It lasts about 10 to 15 minutes.  There are no convulsions, tongue biting, incontinence or postictal confusion.  It occurs once every 6 weeks.  He has had MRI of the brain with and without contrast  performed on 04/06/08, which reportedly showed small vessel disease but no acute findings.  Carotid doppler was negative.  He reports one possible seizure that may have been provoked when using cocaine back in college.  EEG from 2018 was normal.  Otherwise, no history of seizures.  No history of stroke.  No known family history of seizures.  He also reports history of memory problems.  Specifically, he has trouble remembering events or conversations that occurred just two days ago.  He is independent cognitively in performing ADLs.  He manages his finances.  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  He does not drink alcohol.  He uses marijuana.  He has a JD but never practiced law.  His father passed away at age 3.  His mother did not have dementia. When I saw him in July 2017, he was found to have low B12 of 166 and was started on supplementation.  He did not return for follow up. CT of head from 03/18/16 showed no acute abnormality.  CT of cervical spine showed degenerative changes.  He has longstanding history of narcotic and benzodiazepine use.  CT of head from 08/10/13 was personally reviewed and is unremarkable.  PAST MEDICAL HISTORY: Past Medical History:  Diagnosis Date  . Anxiety   . Automatic implantable cardiac defibrillator [employer]    Analyze ST  . Benign prostatic hypertrophy   . Benzodiazepine dependence (HCC)    chronic  . CAD (coronary artery  disease)    Last cath 2/12. 3-v CAD. Failed PCI of distal RCAc  CATH [facility] 4/13 with DES to  LAD X2  . CHF (congestive heart failure) ([place])   . Chronic back pain    lumbar stenosis  . Chronic systolic heart failure (HCC)    EF 20-25%. s/p ST[employer] Jude ICD  . Depression   . DJD (degenerative joint disease)   . History of testicular cancer   . Narcotic dependence (HCC)    chronic  . Vitamin B12 deficiency 07/22/2016    MEDICATIONS: Current Outpatient Medications on File Prior to Visit  Medication Sig Dispense Refill  . albuterol  (VENTOLIN HFA) 108 (90 Base) MCG/ACT inhaler Inhale 2 puffs into the lungs every 6 (six) hours as needed for wheezing or shortness of breath. 18 g 0  . alfuzosin (UROXATRAL) 10 MG 24 hr tablet Take 10 mg by mouth as needed. For UTI    . ALPRAZolam (XANAX) 1 MG tablet Take by mouth.    Marland Kitchen aspirin 325 MG tablet Take 325 mg by mouth daily.    . carvedilol (COREG) 25 MG tablet TAKE 1 TABLET TWICE DAILY WITH A MEAL. 180 tablet 3  . clopidogrel (PLAVIX) 75 MG tablet TAKE 1 TABLET EACH DAY. 90 tablet 0  . cyanocobalamin (,VITAMIN B-12,) 1000 MCG/ML injection Inject 1 mL (1,000 mcg total) into the muscle every 30 (thirty) days. 10 mL 3  . diazepam (VALIUM) 10 MG tablet   2  . ezetimibe (ZETIA) 10 MG tablet TAKE 1 TABLET EACH DAY. 90 tablet 3  . gabapentin (NEURONTIN) 300 MG capsule Take 2 capsules (600 mg total) by mouth 3 (three) times daily. 180 capsule 3  . lactulose (CHRONULAC) 10 GM/15ML solution Take 15 mLs (10 g total) by mouth 2 (two) times daily as needed for mild constipation. 1892 mL 0  . lisinopril (PRINIVIL,ZESTRIL) 5 MG tablet Take 1 tablet (5 mg total) by mouth 2 (two) times daily. 180 tablet 0  . LORazepam (ATIVAN) 1 MG tablet Take 1 mg by mouth 4 (four) times daily as needed for anxiety or sleep. For anxiety    . morphine (MSIR) 30 MG tablet Take 1 tablet (30 mg total) by mouth every 6 (six) hours as needed for severe pain. 120 tablet 0  . mupirocin ointment (BACTROBAN) 2 % Apply 1 application topically 2 (two) times daily. 22 g 0  . naproxen (NAPROSYN) 500 MG tablet Take 1 tablet (500 mg total) by mouth 2 (two) times daily. 30 tablet 0  . nitroGLYCERIN (NITROLINGUAL) 0.4 MG/SPRAY spray Place under the tongue.    . ondansetron (ZOFRAN-ODT) 8 MG disintegrating tablet Take 1 tablet (8 mg total) by mouth every 8 (eight) hours as needed for nausea or vomiting. 20 tablet 1  . pantoprazole (PROTONIX) 40 MG tablet Take 1 tablet (40 mg total) by mouth daily. 30 tablet 3  . polyethylene glycol  powder (GLYCOLAX/MIRALAX) powder MIX 1 CAPFUL TWICE DAILY AS NEEDED. 250 g 0  . ranitidine (ZANTAC) 150 MG tablet Take 1 tablet (150 mg total) by mouth 2 (two) times daily. 60 tablet 0  . rosuvastatin (CRESTOR) 40 MG tablet TAKE 1 TABLET DAILY. 90 tablet 3  . sildenafil (REVATIO) 20 MG tablet Take by mouth.    . spironolactone (ALDACTONE) 25 MG tablet TAKE (1/2) TABLET DAILY. 45 tablet 0  . sucralfate (CARAFATE) 1 g tablet Take 1 tablet (1 g total) by mouth 4 (four) times daily -  with meals and at bedtime. [place]  tablet 0  . tamsulosin (FLOMAX) 0.4 MG CAPS capsule Take 1 capsule (0.4 mg total) by mouth daily after breakfast. 90 capsule 0  . testosterone cypionate (DEPOTESTOTERONE CYPIONATE) 200 MG/ML injection Inject into the muscle every 14 (fourteen) days.      Marland Kitchen torsemide (DEMADEX) 20 MG tablet Take 1 tablet (20 mg total) by mouth daily as needed. For swelling in legs 90 tablet 2  . traZODone (DESYREL) 100 MG tablet Take 100 mg by mouth at bedtime as needed for sleep.     Marland Kitchen triamcinolone (NASACORT AQ) 55 MCG/ACT AERO nasal inhaler Place 1 spray into the nose daily. 1 Inhaler 12  . vancomycin (VANCOCIN) 125 MG capsule Take 1 capsule (125 mg total) by mouth 4 (four) times daily. 28 capsule 0  . VIAGRA 100 MG tablet TAKE 1 TABLET APPROXIMATELY 1 HOUR BEFORE NEEDING AS DIRECTED. 4 tablet 0  . zolpidem (AMBIEN) 10 MG tablet Take 1 tablet by mouth at bedtime as needed for sleep.   4   Current Facility-Administered Medications on File Prior to Visit  Medication Dose Route Frequency Provider Last Rate Last Dose  . levalbuterol (XOPENEX) nebulizer solution 0.63 mg  0.63 mg Nebulization Once Wardell Honour, MD        ALLERGIES: Allergies  Allergen Reactions  . Darvocet [Propoxyphene N-Acetaminophen] Anaphylaxis    Throat closes  . Propoxyphene Anaphylaxis and Swelling    FAMILY HISTORY: Family History  Problem Relation Age of Onset  . Heart disease Father 30       Died of MI  . Cancer Mother      SOCIAL HISTORY: Social History   Socioeconomic History  . Marital status: Married    Spouse name: Not on file  . Number of children: 3  . Years of education: Not on file  . Highest education level: Not on file  Occupational History  . Not on file  Social Needs  . Financial resource strain: Not on file  . Food insecurity:    Worry: Not on file    Inability: Not on file  . Transportation needs:    Medical: Not on file    Non-medical: Not on file  Tobacco Use  . Smoking status: Current Every Day Smoker    Packs/day: 0.50    Years: 29.00    Pack years: 14.50    Types: Cigarettes, E-cigarettes    Last attempt to quit: 07/14/2015    Years since quitting: 2.4  . Smokeless tobacco: Never Used  Substance and Sexual Activity  . Alcohol use: No    Alcohol/week: 0.0 oz  . Drug use: Yes    Types: Marijuana    Comment: Urine showed THC  . Sexual activity: Not on file  Lifestyle  . Physical activity:    Days per week: Not on file    Minutes per session: Not on file  . Stress: Not on file  Relationships  . Social connections:    Talks on phone: Not on file    Gets together: Not on file    Attends religious service: Not on file    Active member of club or organization: Not on file    Attends meetings of clubs or organizations: Not on file    Relationship status: Not on file  . Intimate partner violence:    Fear of current or ex partner: Not on file    Emotionally abused: Not on file    Physically abused: Not on file    Forced sexual  activity: Not on file  Other Topics Concern  . Not on file  Social History Narrative   Marital status: married x 12 years; wife is severe alcoholic.      Children: 3 married daughters (23, 57, 16); 1 son (52yo); 3 grandchildren born in 2017      Lives: with wife, son      Left-handed   Caffeine: 3 drinks per day    REVIEW OF SYSTEMS: Constitutional: No fevers, chills, or sweats, no generalized fatigue, change in appetite Eyes: No  visual changes, double vision, eye pain Ear, nose and throat: No hearing loss, ear pain, nasal congestion, sore throat Cardiovascular: No chest pain, palpitations Respiratory:  No shortness of breath at rest or with exertion, wheezes GastrointestinaI: No nausea, vomiting, diarrhea, abdominal pain, fecal incontinence Genitourinary:  No dysuria, urinary retention or frequency Musculoskeletal:  Back and right hip pain Integumentary: No rash, pruritus, skin lesions Neurological: as above Psychiatric: No depression, insomnia, anxiety Endocrine: No palpitations, fatigue, diaphoresis, mood swings, change in appetite, change in weight, increased thirst Hematologic/Lymphatic:  No purpura, petechiae. Allergic/Immunologic: no itchy/runny eyes, nasal congestion, recent allergic reactions, rashes  PHYSICAL EXAM: Vitals:   01/10/18 1624  BP: 132/82  Pulse: 64  SpO2: 95%   General: No acute distress.  Patient appears well-groomed.   Head:  Normocephalic/atraumatic Eyes:  Fundi examined but not visualized Neck: supple, no paraspinal tenderness, full range of motion Heart:  Regular rate and rhythm Lungs:  Clear to auscultation bilaterally Back: No paraspinal tenderness Neurological Exam: alert and oriented to person, place, and time. Attention span and concentration intact, recent and remote memory intact, fund of knowledge intact.  Speech fluent and not dysarthric, language intact.  CN II-XII intact. Bulk and tone normal, muscle strength 5/5 throughout.  Sensation to pinprick, temperature and vibration decreased in feet  Deep tendon reflexes 1+ throughout, toes downgoing.  Finger to nose and heel to shin testing intact.  Right antalgic gait, Romberg negative.  IMPRESSION: Lumbar stenosis with right sided radiculopathy.  He is unable to have an MRI of lumbar spine due to pacemaker.  Alternatively, I suggested CT myelogram to better evaluate for nerve root impingement.  Pending results, treatment  would involve pain medication, interventional pain management (such as epidural injections) or surgery.  He would like to check with his cardiologist first about having his pacemaker changed to MRI-compatible one.  He will contact me regarding whether to proceed with CT.  In the meantime, he should continue gabapentin.  25 minutes spent face to face with patient, over 50% spent discussing management.  Trevor Clines, DO  CC:  Reginia Forts, MD

## 2018-01-10 NOTE — Patient Instructions (Signed)
1.  Check to see if your pacemaker can be changed to an MRI compatible one.  If not, contact me and we can pursue CT 2.  Continue gabapentin 600mg  three times daily

## 2018-01-11 ENCOUNTER — Ambulatory Visit: Payer: BLUE CROSS/BLUE SHIELD | Admitting: Family Medicine

## 2018-01-12 ENCOUNTER — Encounter: Payer: Self-pay | Admitting: Cardiology

## 2018-01-12 ENCOUNTER — Ambulatory Visit: Payer: BLUE CROSS/BLUE SHIELD | Admitting: Family Medicine

## 2018-01-12 ENCOUNTER — Encounter: Payer: Self-pay | Admitting: Family Medicine

## 2018-01-12 ENCOUNTER — Other Ambulatory Visit: Payer: Self-pay

## 2018-01-12 ENCOUNTER — Other Ambulatory Visit (HOSPITAL_COMMUNITY): Payer: Self-pay | Admitting: Internal Medicine

## 2018-01-12 VITALS — BP 134/76 | HR 78 | Temp 98.8°F | Resp 18 | Ht 72.0 in | Wt 205.0 lb

## 2018-01-12 DIAGNOSIS — R0902 Hypoxemia: Secondary | ICD-10-CM

## 2018-01-12 DIAGNOSIS — D751 Secondary polycythemia: Secondary | ICD-10-CM

## 2018-01-12 DIAGNOSIS — F5089 Other specified eating disorder: Secondary | ICD-10-CM

## 2018-01-12 DIAGNOSIS — G8929 Other chronic pain: Secondary | ICD-10-CM

## 2018-01-12 DIAGNOSIS — M545 Low back pain, unspecified: Secondary | ICD-10-CM

## 2018-01-12 DIAGNOSIS — K5903 Drug induced constipation: Secondary | ICD-10-CM

## 2018-01-12 DIAGNOSIS — E538 Deficiency of other specified B group vitamins: Secondary | ICD-10-CM | POA: Diagnosis not present

## 2018-01-12 DIAGNOSIS — R2681 Unsteadiness on feet: Secondary | ICD-10-CM | POA: Diagnosis not present

## 2018-01-12 DIAGNOSIS — A0472 Enterocolitis due to Clostridium difficile, not specified as recurrent: Secondary | ICD-10-CM | POA: Diagnosis not present

## 2018-01-12 DIAGNOSIS — M48062 Spinal stenosis, lumbar region with neurogenic claudication: Secondary | ICD-10-CM

## 2018-01-12 DIAGNOSIS — E291 Testicular hypofunction: Secondary | ICD-10-CM | POA: Diagnosis not present

## 2018-01-12 MED ORDER — GABAPENTIN 300 MG PO CAPS: 900.0000 mg | ORAL_CAPSULE | Freq: Three times a day (TID) | ORAL | 5 refills | Status: DC

## 2018-01-12 MED ORDER — CYANOCOBALAMIN 1000 MCG/ML IJ SOLN: 1000.0000 ug | INTRAMUSCULAR | 3 refills | Status: DC

## 2018-01-12 MED ORDER — ONDANSETRON 8 MG PO TBDP: 8.0000 mg | ORAL_TABLET | Freq: Three times a day (TID) | ORAL | 1 refills | Status: DC | PRN

## 2018-01-12 MED ORDER — PANTOPRAZOLE SODIUM 40 MG PO TBEC: 40.0000 mg | DELAYED_RELEASE_TABLET | Freq: Every day | ORAL | 3 refills | Status: DC

## 2018-01-12 NOTE — Progress Notes (Signed)
Subjective:    Patient ID: [name], male    DOB: Nov 13, 1960, 57 y.o.   MRN: [id number]  01/12/2018  C. difficile (follow up )    HPI This 57 y.o. male presents for evaluation of nausea with vomiting, recent C. Difficile colitis, ataxic gait, vitamin B12 deficiency, polycythemia.  S/p neurology consultation by Dr. Tomi Likens on 01/10/18 for unsteady gait; recommended CT myelogram since patient cannot undergo MRI lumbar spine.  Recommended continuing gabapentin.  Concerned with lumbar spine pathology as cause of leg weakness, unsteady gait.  S/p hematology consultation for polycythema on 01/06/18.  S/p bone marrow biopsy on 12/31/17 that was negative for myeloproliferative disorder.  Feels secondary to sleep apnea, active smoking, testosterone supplementation, nocturnal hypoxia.  Phlebotomy not indicated; follow-up PRN only.  Nausea with vomiting: resolved/much improved; needs refill of Protonix.  Has not called to schedule GI appointment; was contacted yet office has not called patient back to schedule.  C. Difficile colilitis: diarrhea resolved; s/p vancomycin x 2 rounds.  S/p CT abdomen/pelvis negative other than large stool burden.  Constipation: continues to take lactulose.  Ongoing struggles with constipation.  Takes methadone daily.  Vitamin b12 deficiency: low level in the past two months; refill of b12 provided; administering monthly; emailed to ask if needed more B12.  Pt did not receive message from provider advising to increase to weekly administration x 4 weeks.  UPDATE:  Confused and dizzy. Unsteadiness and low back pain.   BP Readings from Last 3 Encounters:  01/12/18 134/76  01/10/18 132/82  01/06/18 131/71   Wt Readings from Last 3 Encounters:  01/12/18 205 lb (93 kg)  01/10/18 205 lb (93 kg)  01/06/18 211 lb 12.8 oz (96.1 kg)   Immunization History  Administered Date(s) Administered  . Influenza Split 04/12/2013, 09/11/2015  . Influenza Whole 05/25/2012  .  Influenza,inj,Quad PF,6+ Mos 06/11/2014, 09/25/2015, 05/11/2016, 07/01/2016, 06/14/2017  . Influenza-Unspecified 05/25/2012, 04/20/2013, 03/14/2017  . Pneumococcal Conjugate-13 06/11/2014  . Pneumococcal Polysaccharide-23 07/13/2013, 08/10/2013  . Td 08/10/2013    Review of Systems  Constitutional: Negative for activity change, appetite change, chills, diaphoresis, fatigue, fever and unexpected weight change.  HENT: Negative for congestion, dental problem, drooling, ear discharge, ear pain, facial swelling, hearing loss, mouth sores, nosebleeds, postnasal drip, rhinorrhea, sinus pressure, sneezing, sore throat, tinnitus, trouble swallowing and voice change.   Eyes: Negative for photophobia, pain, discharge, redness, itching and visual disturbance.  Respiratory: Negative for apnea, cough, choking, chest tightness, shortness of breath, wheezing and stridor.   Cardiovascular: Negative for chest pain, palpitations and leg swelling.  Gastrointestinal: Positive for constipation. Negative for abdominal distention, abdominal pain, anal bleeding, blood in stool, diarrhea, nausea, rectal pain and vomiting.  Endocrine: Negative for cold intolerance, heat intolerance, polydipsia, polyphagia and polyuria.  Genitourinary: Negative for decreased urine volume, difficulty urinating, discharge, dysuria, enuresis, flank pain, frequency, genital sores, hematuria, penile pain, penile swelling, scrotal swelling, testicular pain and urgency.  Musculoskeletal: Positive for back pain and gait problem. Negative for arthralgias, joint swelling, myalgias, neck pain and neck stiffness.  Skin: Negative for color change, pallor, rash and wound.  Allergic/Immunologic: Negative for environmental allergies, food allergies and immunocompromised state.  Neurological: Positive for weakness and numbness. Negative for dizziness, tremors, seizures, syncope, facial asymmetry, speech difficulty, light-headedness and headaches.    Hematological: Negative for adenopathy. Does not bruise/bleed easily.  Psychiatric/Behavioral: Negative for agitation, behavioral problems, confusion, decreased concentration, dysphoric mood, hallucinations, self-injury, sleep disturbance and suicidal ideas. The patient is nervous/anxious. The patient  is not hyperactive.     Past Medical History:  Diagnosis Date  . Anxiety   . Automatic implantable cardiac defibrillator [employer]    Analyze ST  . Benign prostatic hypertrophy   . Benzodiazepine dependence (HCC)    chronic  . CAD (coronary artery disease)    Last cath 2/12. 3-v CAD. Failed PCI of distal RCAc  CATH [facility] 4/13 with DES to  LAD X2  . CHF (congestive heart failure) ([place])   . Chronic back pain    lumbar stenosis  . Chronic systolic heart failure (HCC)    EF 20-25%. s/p ST. Jude ICD  . Depression   . DJD (degenerative joint disease)   . History of testicular cancer   . Narcotic dependence (HCC)    chronic  . Vitamin B12 deficiency 07/22/2016   Past Surgical History:  Procedure Laterality Date  . ASD REPAIR, SINUS VENOSUS    . CARDIAC DEFIBRILLATOR PLACEMENT  08/2010  . HERNIA REPAIR    . LEFT HEART CATHETERIZATION WITH CORONARY ANGIOGRAM N/A 06/14/2014   Procedure: LEFT HEART CATHETERIZATION WITH CORONARY ANGIOGRAM;  Surgeon: Jolaine Artist, MD;  Location: [facility] CATH LAB;  Service: Cardiovascular;  Laterality: N/A;  . TESTICLE SURGERY     testicular cancer surgery   Allergies  Allergen Reactions  . Darvocet [Propoxyphene N-Acetaminophen] Anaphylaxis    Throat closes  . Propoxyphene Anaphylaxis and Swelling   Current Outpatient Medications on File Prior to Visit  Medication Sig Dispense Refill  . albuterol (VENTOLIN HFA) 108 (90 Base) MCG/ACT inhaler Inhale 2 puffs into the lungs every 6 (six) hours as needed for wheezing or shortness of breath. 18 g 0  . alfuzosin (UROXATRAL) 10 MG 24 hr tablet Take 10 mg by mouth as needed. For UTI    . ALPRAZolam (XANAX) 1 MG  tablet Take by mouth.    Marland Kitchen aspirin 325 MG tablet Take 325 mg by mouth daily.    . carvedilol (COREG) 25 MG tablet TAKE 1 TABLET TWICE DAILY WITH A MEAL. 180 tablet 3  . diazepam (VALIUM) 10 MG tablet   2  . ezetimibe (ZETIA) 10 MG tablet TAKE 1 TABLET EACH DAY. 90 tablet 3  . lactulose (CHRONULAC) 10 GM/15ML solution Take 15 mLs (10 g total) by mouth 2 (two) times daily as needed for mild constipation. 1892 mL 0  . lisinopril (PRINIVIL,ZESTRIL) 5 MG tablet Take 1 tablet (5 mg total) by mouth 2 (two) times daily. 180 tablet 0  . LORazepam (ATIVAN) 1 MG tablet Take 1 mg by mouth 4 (four) times daily as needed for anxiety or sleep. For anxiety    . morphine (MSIR) 30 MG tablet Take 1 tablet (30 mg total) by mouth every 6 (six) hours as needed for severe pain. 120 tablet 0  . mupirocin ointment (BACTROBAN) 2 % Apply 1 application topically 2 (two) times daily. 22 g 0  . naproxen (NAPROSYN) 500 MG tablet Take 1 tablet (500 mg total) by mouth 2 (two) times daily. 30 tablet 0  . nitroGLYCERIN (NITROLINGUAL) 0.4 MG/SPRAY spray Place under the tongue.    . polyethylene glycol powder (GLYCOLAX/MIRALAX) powder MIX 1 CAPFUL TWICE DAILY AS NEEDED. 250 g 0  . ranitidine (ZANTAC) 150 MG tablet Take 1 tablet (150 mg total) by mouth 2 (two) times daily. 60 tablet 0  . rosuvastatin (CRESTOR) 40 MG tablet TAKE 1 TABLET DAILY. 90 tablet 3  . sildenafil (REVATIO) 20 MG tablet Take by mouth.    . sucralfate (  CARAFATE) 1 g tablet Take 1 tablet (1 g total) by mouth 4 (four) times daily -  with meals and at bedtime. 120 tablet 0  . tamsulosin (FLOMAX) 0.4 MG CAPS capsule Take 1 capsule (0.4 mg total) by mouth daily after breakfast. 90 capsule 0  . testosterone cypionate (DEPOTESTOTERONE CYPIONATE) 200 MG/ML injection Inject into the muscle every 14 (fourteen) days.      Marland Kitchen torsemide (DEMADEX) 20 MG tablet Take 1 tablet (20 mg total) by mouth daily as needed. For swelling in legs 90 tablet 2  . traZODone (DESYREL) 100 MG  tablet Take 100 mg by mouth at bedtime as needed for sleep.     Marland Kitchen triamcinolone (NASACORT AQ) 55 MCG/ACT AERO nasal inhaler Place 1 spray into the nose daily. 1 Inhaler 12  . vancomycin (VANCOCIN) 125 MG capsule Take 1 capsule (125 mg total) by mouth 4 (four) times daily. 28 capsule 0  . VIAGRA 100 MG tablet TAKE 1 TABLET APPROXIMATELY 1 HOUR BEFORE NEEDING AS DIRECTED. 4 tablet 0  . zolpidem (AMBIEN) 10 MG tablet Take 1 tablet by mouth at bedtime as needed for sleep.   4   Current Facility-Administered Medications on File Prior to Visit  Medication Dose Route Frequency Provider Last Rate Last Dose  . levalbuterol (XOPENEX) nebulizer solution 0.63 mg  0.63 mg Nebulization Once Wardell Honour, MD       Social History   Socioeconomic History  . Marital status: Married    Spouse name: Not on file  . Number of children: 3  . Years of education: Not on file  . Highest education level: Not on file  Occupational History  . Not on file  Social Needs  . Financial resource strain: Not on file  . Food insecurity:    Worry: Not on file    Inability: Not on file  . Transportation needs:    Medical: Not on file    Non-medical: Not on file  Tobacco Use  . Smoking status: Current Every Day Smoker    Packs/day: 0.50    Years: 29.00    Pack years: 14.50    Types: Cigarettes, E-cigarettes    Last attempt to quit: 07/14/2015    Years since quitting: 2.5  . Smokeless tobacco: Never Used  Substance and Sexual Activity  . Alcohol use: No    Alcohol/week: 0.0 oz  . Drug use: Yes    Types: Marijuana    Comment: Urine showed THC  . Sexual activity: Not on file  Lifestyle  . Physical activity:    Days per week: Not on file    Minutes per session: Not on file  . Stress: Not on file  Relationships  . Social connections:    Talks on phone: Not on file    Gets together: Not on file    Attends religious service: Not on file    Active member of club or organization: Not on file    Attends  meetings of clubs or organizations: Not on file    Relationship status: Not on file  . Intimate partner violence:    Fear of current or ex partner: Not on file    Emotionally abused: Not on file    Physically abused: Not on file    Forced sexual activity: Not on file  Other Topics Concern  . Not on file  Social History Narrative   Marital status: married x 12 years; wife is severe alcoholic.      Children: 3  married daughters (23, 57, 17); 1 son (12yo); 3 grandchildren born in 2017      Lives: with wife, son      Left-handed   Caffeine: 3 drinks per day   Family History  Problem Relation Age of Onset  . Heart disease Father 37       Died of MI  . Cancer Mother        Objective:    BP 134/76   Pulse 78   Temp 98.8 F (37.1 C) (Oral)   Resp 18   Ht 6' (1.829 m)   Wt 205 lb (93 kg)   SpO2 95%   BMI 27.80 kg/m  Physical Exam  Constitutional: He is oriented to person, place, and time. He appears well-developed and well-nourished. No distress.  HENT:  Head: Normocephalic and atraumatic.  Right Ear: External ear normal.  Left Ear: External ear normal.  Nose: Nose normal.  Mouth/Throat: Oropharynx is clear and moist.  Eyes: Pupils are equal, round, and reactive to light. Conjunctivae and EOM are normal.  Neck: Normal range of motion. Neck supple. Carotid bruit is not present. No thyromegaly present.  Cardiovascular: Normal rate, regular rhythm, normal heart sounds and intact distal pulses. Exam reveals no gallop and no friction rub.  No murmur heard. Pulmonary/Chest: Effort normal and breath sounds normal. He has no wheezes. He has no rales.  Abdominal: Soft. Bowel sounds are normal. He exhibits no distension and no mass. There is no tenderness. There is no rebound and no guarding.  Musculoskeletal:       Lumbar back: He exhibits decreased range of motion and pain.  Lymphadenopathy:    He has no cervical adenopathy.  Neurological: He is alert and oriented to person,  place, and time. He displays normal reflexes. No cranial nerve deficit. He exhibits normal muscle tone. Coordination abnormal.  Skin: Skin is warm and dry. No rash noted. He is not diaphoretic.  Psychiatric: He has a normal mood and affect. His behavior is normal.  Nursing note and vitals reviewed.  No results found. Depression screen [facility] 2/9 01/12/2018 12/22/2017 12/10/2017 11/29/2017 11/24/2017  Decreased Interest 1 1 0 0 0  Down, Depressed, Hopeless 1 1 0 0 0  PHQ - 2 Score 2 2 0 0 0  Altered sleeping 0 0 - - -  Tired, decreased energy 1 0 - - -  Change in appetite 0 0 - - -  Feeling bad or failure about yourself  0 0 - - -  Trouble concentrating 0 0 - - -  Moving slowly or fidgety/restless 1 0 - - -  Suicidal thoughts 0 0 - - -  PHQ-9 Score 4 2 - - -  Some recent data might be hidden   Fall Risk  01/12/2018 01/10/2018 12/22/2017 12/10/2017 11/29/2017  Falls in the past year? Yes Yes Yes Yes Yes  Comment - - - - -  Number falls in past yr: 2 or more 2 or more 2 or more 2 or more 2 or more  Comment - - - - -  Injury with Fall? Yes No No No No  Comment - - - - -  Risk Factor Category  - High Fall Risk - - -  Risk for fall due to : - - - - -  Risk for fall due to: Comment - - - - -  Follow up - - - - -  Comment - - - - -  Assessment & Plan:   1. Neurogenic claudication due to lumbar spinal stenosis   2. Chronic bilateral low back pain without sciatica   3. Drug-induced constipation   4. Erythrocytosis   5. Hypoxemia   6. C. difficile colitis   7. Psychogenic vomiting with nausea   8. Unsteady gait   9. Vitamin B12 deficiency   10. Hypogonadism in male    Unsteady gait: persistent; s/p neurology consultation; feels due to lumbar pathology; recommending CT myelogram due to pacemaker.  Question if B12 deficiency contributing to unsteady gait; treat B12 deficiency aggressively while undergoing further work up.  Lumbar stenosis/chronic lower back pain: to schedule CT myelogram;  increase Gabapentin to 925m tid. Pt taking methadone.  Vitamin B12 deficiency: uncontrolled; recommend cyanocobalamin 10028m weekly x 4 weeks and then monthly.  C. Difficile colitis: resolved; s/p vancomycin x 2 rx.  Asymptomatic.  S/p CT abdomen/pelvis negative.  Nausea with vomiting: resolved; refill of Protonix and Zofran provided.  Recommend contacting [facility] for appointment.  Polycythemia: s/p hematology consultation with bone marrow bx negative.  Felt secondary to testosterone supplementation, OSA, nocturnal hypoxia, tobacco abuse. No further work up warranted.  Constipation: chronic; continue Lactulose therapy.  No orders of the defined types were placed in this encounter.  Meds ordered this encounter  Medications  . cyanocobalamin (,VITAMIN B-12,) 1000 MCG/ML injection    Sig: Inject 1 mL (1,000 mcg total) into the muscle once a week. For 4 weeks and then once per month    Dispense:  10 mL    Refill:  3  . pantoprazole (PROTONIX) 40 MG tablet    Sig: Take 1 tablet (40 mg total) by mouth daily.    Dispense:  90 tablet    Refill:  3  . gabapentin (NEURONTIN) 300 MG capsule    Sig: Take 3 capsules (900 mg total) by mouth 3 (three) times daily.    Dispense:  270 capsule    Refill:  5  . ondansetron (ZOFRAN-ODT) 8 MG disintegrating tablet    Sig: Take 1 tablet (8 mg total) by mouth every 8 (eight) hours as needed for nausea or vomiting.    Dispense:  90 tablet    Refill:  1    Return in about 3 weeks (around 02/02/2018) for recheck.   Tiyona Desouza MaElayne GuerinM.D. [facility]  [phone] [phone]

## 2018-01-12 NOTE — Patient Instructions (Signed)
     IF you received an x-ray today, you will receive an invoice from [facility]. Please contact [facility] at [phone] with questions or concerns regarding your invoice.   IF you received labwork today, you will receive an invoice from [facility]. Please contact [facility] at [phone] with questions or concerns regarding your invoice.   Our billing staff will not be able to assist you with questions regarding bills from these companies.  You will be contacted with the lab results as soon as they are available. The fastest way to get your results is to activate your My Chart account. Instructions are located on the last page of this paperwork. If you have not heard from us regarding the results in 2 weeks, please contact this office.     

## 2018-01-14 ENCOUNTER — Telehealth: Payer: Self-pay | Admitting: Family Medicine

## 2018-01-14 NOTE — Telephone Encounter (Signed)
Copied from [place] [id number]. Topic: Quick Communication - See Telephone Encounter >> Jan 14, 2018 11:24 AM Mylinda Latina, NT wrote: CRM for notification. See Telephone encounter for: 01/14/18. Patient states he is stil experiencing vomiting and diarrhea and he is wondering if Dr. Tamala Julian can send in a refill of the vancomycin (VANCOCIN) 125 MG capsule. Patient is going out  of town and needs this refill today. Patient is requesting a call back CB# [place], [place] [phone] (Phone) [phone] (Fax)

## 2018-01-15 NOTE — Telephone Encounter (Signed)
Message to Reginia Forts MD re: refill on Vancomycin.

## 2018-01-16 DIAGNOSIS — E291 Testicular hypofunction: Secondary | ICD-10-CM | POA: Insufficient documentation

## 2018-01-16 DIAGNOSIS — E538 Deficiency of other specified B group vitamins: Secondary | ICD-10-CM | POA: Insufficient documentation

## 2018-01-16 NOTE — Telephone Encounter (Signed)
Evaluated patient on 01/12/18 and without diarrhea; was actually complaining of significant constipation. S/p CT abdomen and pelvis normal.  Please call for update.  He has received two rounds of vancomycin.

## 2018-01-17 NOTE — Telephone Encounter (Signed)
Spoke to patient the constipation is better, but he wants to let you know he is burping up feces, per patient shit. He just returned this morning from [place] picking up his son. When he returned his wife was still at the house and should have been gone. I advised him in his chart it is recommended he should contact [facility] to make an appointment. He stated he just returned from [facility] and will contact [facility] sometime this week. Also, I advised him he has had two rounds of the Vancomycin. He stated he did not, . Please advise, thank you.

## 2018-01-18 ENCOUNTER — Encounter: Payer: Self-pay | Admitting: Family Medicine

## 2018-01-18 ENCOUNTER — Other Ambulatory Visit: Payer: Self-pay | Admitting: Family Medicine

## 2018-01-18 NOTE — Telephone Encounter (Signed)
Please call patient back --- offer an appointment with me on 01/19/18 for reevaluation.  If he is vomiting, I recommend ED evaluation TODAY.

## 2018-01-19 NOTE — Telephone Encounter (Signed)
LOV  01/12/18 Dr. Laural Benes 12/10/17

## 2018-01-21 NOTE — Telephone Encounter (Signed)
Pt says that he is fine for now, does not need to go to the ED. He will be in to see the doctor before she leaves the practice

## 2018-02-04 ENCOUNTER — Other Ambulatory Visit: Payer: Self-pay

## 2018-02-04 ENCOUNTER — Ambulatory Visit: Payer: BLUE CROSS/BLUE SHIELD | Admitting: Family Medicine

## 2018-02-04 ENCOUNTER — Encounter: Payer: Self-pay | Admitting: Family Medicine

## 2018-02-04 VITALS — BP 142/82 | HR 84 | Temp 98.0°F | Resp 16 | Wt 213.0 lb

## 2018-02-04 DIAGNOSIS — D751 Secondary polycythemia: Secondary | ICD-10-CM

## 2018-02-04 DIAGNOSIS — M5442 Lumbago with sciatica, left side: Secondary | ICD-10-CM

## 2018-02-04 DIAGNOSIS — E291 Testicular hypofunction: Secondary | ICD-10-CM

## 2018-02-04 DIAGNOSIS — G8929 Other chronic pain: Secondary | ICD-10-CM

## 2018-02-04 DIAGNOSIS — J9611 Chronic respiratory failure with hypoxia: Secondary | ICD-10-CM

## 2018-02-04 DIAGNOSIS — G4731 Primary central sleep apnea: Secondary | ICD-10-CM

## 2018-02-04 DIAGNOSIS — K5903 Drug induced constipation: Secondary | ICD-10-CM

## 2018-02-04 DIAGNOSIS — I5042 Chronic combined systolic (congestive) and diastolic (congestive) heart failure: Secondary | ICD-10-CM

## 2018-02-04 DIAGNOSIS — Z8547 Personal history of malignant neoplasm of testis: Secondary | ICD-10-CM

## 2018-02-04 DIAGNOSIS — E538 Deficiency of other specified B group vitamins: Secondary | ICD-10-CM

## 2018-02-04 DIAGNOSIS — Z95810 Presence of automatic (implantable) cardiac defibrillator: Secondary | ICD-10-CM

## 2018-02-04 DIAGNOSIS — M48062 Spinal stenosis, lumbar region with neurogenic claudication: Secondary | ICD-10-CM

## 2018-02-04 DIAGNOSIS — M5441 Lumbago with sciatica, right side: Secondary | ICD-10-CM

## 2018-02-04 DIAGNOSIS — I2510 Atherosclerotic heart disease of native coronary artery without angina pectoris: Secondary | ICD-10-CM

## 2018-02-04 MED ORDER — CYANOCOBALAMIN 1000 MCG/ML IJ SOLN: 1000.0000 ug | INTRAMUSCULAR | 3 refills | Status: DC

## 2018-02-04 MED ORDER — CYANOCOBALAMIN 1000 MCG/ML IJ SOLN
1000.0000 ug | INTRAMUSCULAR | Status: DC
  Administered 2018-02-04: 1000 ug via INTRAMUSCULAR

## 2018-02-04 NOTE — Progress Notes (Signed)
Subjective:    Patient ID: [name], male    DOB: Aug 04, 1960, 57 y.o.   MRN: [id number]  02/04/2018  Chronic Conditions (2 week follow-up from [place] 7/3)    HPI This 57 y.o. male presents for [facility] for nausea/vomiting/constipation, vitamin b12 deficiency, DDD lumbar spine.  RIGHT KNEE PAIN: s/p injection five years ago; osteoarthritis.  Pain is now recurrent.  [facility] never called back to schedule an appointment. [facility] took four weeks to return call.  Requesting testosterone refill. Usually followed by [name].  Did see neurology; first thing they said was secondary to testosterone. [name] knew that was not great for condition.  Testicular cancer hx.    No cancer suspected from oncology. Able to get testosterone from urology.    Gabapentin 343m three times daily; able to walk much better with gabapentin.  Has taken four capsules three times daily.    Never received new rx for Vitamin B12 injections.  BP Readings from Last 3 Encounters:  02/04/18 (!) 142/82  01/12/18 134/76  01/10/18 132/82   Wt Readings from Last 3 Encounters:  02/04/18 213 lb (96.6 kg)  01/12/18 205 lb (93 kg)  01/10/18 205 lb (93 kg)   Immunization History  Administered Date(s) Administered  . Influenza Split 04/12/2013, 09/11/2015  . Influenza Whole 05/25/2012  . Influenza,inj,Quad PF,6+ Mos 06/11/2014, 09/25/2015, 05/11/2016, 07/01/2016, 06/14/2017  . Influenza-Unspecified 05/25/2012, 04/20/2013, 03/14/2017  . Pneumococcal Conjugate-13 06/11/2014  . Pneumococcal Polysaccharide-23 07/13/2013, 08/10/2013  . Td 08/10/2013    Review of Systems  Constitutional: Negative for activity change, appetite change, chills, diaphoresis, fatigue, fever and unexpected weight change.  HENT: Negative for congestion, dental problem, drooling, ear discharge, ear pain, facial swelling, hearing loss, mouth sores, nosebleeds, postnasal drip, rhinorrhea, sinus pressure, sneezing,  sore throat, tinnitus, trouble swallowing and voice change.   Eyes: Negative for photophobia, pain, discharge, redness, itching and visual disturbance.  Respiratory: Negative for apnea, cough, choking, chest tightness, shortness of breath, wheezing and stridor.   Cardiovascular: Negative for chest pain, palpitations and leg swelling.  Gastrointestinal: Positive for constipation. Negative for abdominal pain, blood in stool, diarrhea, nausea and vomiting.  Endocrine: Negative for cold intolerance, heat intolerance, polydipsia, polyphagia and polyuria.  Genitourinary: Negative for decreased urine volume, difficulty urinating, discharge, dysuria, enuresis, flank pain, frequency, genital sores, hematuria, penile pain, penile swelling, scrotal swelling, testicular pain and urgency.  Musculoskeletal: Positive for back pain and gait problem. Negative for arthralgias, joint swelling, myalgias, neck pain and neck stiffness.  Skin: Negative for color change, pallor, rash and wound.  Allergic/Immunologic: Negative for environmental allergies, food allergies and immunocompromised state.  Neurological: Positive for weakness. Negative for dizziness, tremors, seizures, syncope, facial asymmetry, speech difficulty, light-headedness, numbness and headaches.  Hematological: Negative for adenopathy. Does not bruise/bleed easily.  Psychiatric/Behavioral: Negative for agitation, behavioral problems, confusion, decreased concentration, dysphoric mood, hallucinations, self-injury, sleep disturbance and suicidal ideas. The patient is not nervous/anxious and is not hyperactive.     Past Medical History:  Diagnosis Date  . Anxiety   . Automatic implantable cardiac defibrillator [employer]    Analyze ST  . Benign prostatic hypertrophy   . Benzodiazepine dependence (HCC)    chronic  . CAD (coronary artery disease)    Last cath 2/12. 3-v CAD. Failed PCI of distal RCAc  CATH [facility] 4/13 with DES to  LAD X2  . CHF (congestive  heart failure) ([facility]   . Chronic back pain    lumbar stenosis  .  Chronic systolic heart failure (HCC)    EF 20-25%. s/p ST. Jude ICD  . Depression   . DJD (degenerative joint disease)   . History of testicular cancer   . Narcotic dependence (HCC)    chronic  . Vitamin B12 deficiency 07/22/2016   Past Surgical History:  Procedure Laterality Date  . ASD REPAIR, SINUS VENOSUS    . CARDIAC DEFIBRILLATOR PLACEMENT  08/2010  . HERNIA REPAIR    . LEFT HEART CATHETERIZATION WITH CORONARY ANGIOGRAM N/A 06/14/2014   Procedure: LEFT HEART CATHETERIZATION WITH CORONARY ANGIOGRAM;  Surgeon: Jolaine Artist, MD;  Location: [facility] CATH LAB;  Service: Cardiovascular;  Laterality: N/A;  . TESTICLE SURGERY     testicular cancer surgery   Allergies  Allergen Reactions  . Darvocet [Propoxyphene N-Acetaminophen] Anaphylaxis    Throat closes  . Propoxyphene Anaphylaxis and Swelling   Current Outpatient Medications on File Prior to Visit  Medication Sig Dispense Refill  . albuterol (VENTOLIN HFA) 108 (90 Base) MCG/ACT inhaler Inhale 2 puffs into the lungs every 6 (six) hours as needed for wheezing or shortness of breath. 18 g 0  . alfuzosin (UROXATRAL) 10 MG 24 hr tablet Take 10 mg by mouth as needed. For UTI    . ALPRAZolam (XANAX) 1 MG tablet Take by mouth.    Marland Kitchen aspirin 325 MG tablet Take 325 mg by mouth daily.    . carvedilol (COREG) 25 MG tablet TAKE 1 TABLET TWICE DAILY WITH A MEAL. 180 tablet 3  . clopidogrel (PLAVIX) 75 MG tablet TAKE 1 TABLET EACH DAY. 90 tablet 0  . diazepam (VALIUM) 10 MG tablet   2  . ezetimibe (ZETIA) 10 MG tablet TAKE 1 TABLET EACH DAY. 90 tablet 3  . gabapentin (NEURONTIN) 300 MG capsule Take 3 capsules (900 mg total) by mouth 3 (three) times daily. 270 capsule 5  . lactulose (CHRONULAC) 10 GM/15ML solution Take 15 mLs (10 g total) by mouth 2 (two) times daily as needed for mild constipation. 1892 mL 0  . lisinopril (PRINIVIL,ZESTRIL) 5 MG tablet Take 1 tablet (5 mg total)  by mouth 2 (two) times daily. 180 tablet 0  . LORazepam (ATIVAN) 1 MG tablet Take 1 mg by mouth 4 (four) times daily as needed for anxiety or sleep. For anxiety    . morphine (MSIR) 30 MG tablet Take 1 tablet (30 mg total) by mouth every 6 (six) hours as needed for severe pain. 120 tablet 0  . mupirocin ointment (BACTROBAN) 2 % Apply 1 application topically 2 (two) times daily. 22 g 0  . naproxen (NAPROSYN) 500 MG tablet Take 1 tablet (500 mg total) by mouth 2 (two) times daily. 30 tablet 0  . nitroGLYCERIN (NITROLINGUAL) 0.4 MG/SPRAY spray Place under the tongue.    . ondansetron (ZOFRAN-ODT) 8 MG disintegrating tablet Take 1 tablet (8 mg total) by mouth every 8 (eight) hours as needed for nausea or vomiting. 90 tablet 1  . pantoprazole (PROTONIX) 40 MG tablet Take 1 tablet (40 mg total) by mouth daily. 90 tablet 3  . polyethylene glycol powder (GLYCOLAX/MIRALAX) powder MIX 1 CAPFUL TWICE DAILY AS NEEDED. 250 g 0  . ranitidine (ZANTAC) 150 MG tablet Take 1 tablet (150 mg total) by mouth 2 (two) times daily. 60 tablet 0  . rosuvastatin (CRESTOR) 40 MG tablet TAKE 1 TABLET DAILY. 90 tablet 3  . sildenafil (REVATIO) 20 MG tablet Take by mouth.    . spironolactone (ALDACTONE) 25 MG tablet TAKE (1/2) TABLET DAILY. Clive  tablet 0  . sucralfate (CARAFATE) 1 g tablet Take 1 tablet (1 g total) by mouth 4 (four) times daily -  with meals and at bedtime. 120 tablet 0  . tamsulosin (FLOMAX) 0.4 MG CAPS capsule Take 1 capsule (0.4 mg total) by mouth daily after breakfast. 90 capsule 0  . testosterone cypionate (DEPOTESTOTERONE CYPIONATE) 200 MG/ML injection Inject into the muscle every 14 (fourteen) days.      Marland Kitchen torsemide (DEMADEX) 20 MG tablet Take 1 tablet (20 mg total) by mouth daily as needed. For swelling in legs 90 tablet 2  . traZODone (DESYREL) 100 MG tablet Take 100 mg by mouth at bedtime as needed for sleep.     Marland Kitchen triamcinolone (NASACORT AQ) 55 MCG/ACT AERO nasal inhaler Place 1 spray into the nose  daily. 1 Inhaler 12  . vancomycin (VANCOCIN) 125 MG capsule Take 1 capsule (125 mg total) by mouth 4 (four) times daily. 28 capsule 0  . VIAGRA 100 MG tablet TAKE 1 TABLET APPROXIMATELY 1 HOUR BEFORE NEEDING AS DIRECTED. 4 tablet 0  . zolpidem (AMBIEN) 10 MG tablet Take 1 tablet by mouth at bedtime as needed for sleep.   4   Current Facility-Administered Medications on File Prior to Visit  Medication Dose Route Frequency Provider Last Rate Last Dose  . levalbuterol (XOPENEX) nebulizer solution 0.63 mg  0.63 mg Nebulization Once Wardell Honour, MD       Social History   Socioeconomic History  . Marital status: Married    Spouse name: Not on file  . Number of children: 3  . Years of education: Not on file  . Highest education level: Not on file  Occupational History  . Not on file  Social Needs  . Financial resource strain: Not on file  . Food insecurity:    Worry: Not on file    Inability: Not on file  . Transportation needs:    Medical: Not on file    Non-medical: Not on file  Tobacco Use  . Smoking status: Current Every Day Smoker    Packs/day: 0.50    Years: 29.00    Pack years: 14.50    Types: Cigarettes, E-cigarettes    Last attempt to quit: 07/14/2015    Years since quitting: 2.5  . Smokeless tobacco: Never Used  Substance and Sexual Activity  . Alcohol use: No    Alcohol/week: 0.0 oz  . Drug use: Yes    Types: Marijuana    Comment: Urine showed THC  . Sexual activity: Not on file  Lifestyle  . Physical activity:    Days per week: Not on file    Minutes per session: Not on file  . Stress: Not on file  Relationships  . Social connections:    Talks on phone: Not on file    Gets together: Not on file    Attends religious service: Not on file    Active member of club or organization: Not on file    Attends meetings of clubs or organizations: Not on file    Relationship status: Not on file  . Intimate partner violence:    Fear of current or ex partner: Not on  file    Emotionally abused: Not on file    Physically abused: Not on file    Forced sexual activity: Not on file  Other Topics Concern  . Not on file  Social History Narrative   Marital status: married x 12 years; wife is severe alcoholic.  Children: 3 married daughters (23, 62, 37); 1 son (16yo); 3 grandchildren born in 2017      Lives: with wife, son      Left-handed   Caffeine: 3 drinks per day   Family History  Problem Relation Age of Onset  . Heart disease Father 45       Died of MI  . Cancer Mother        Objective:    BP (!) 142/82   Pulse 84   Temp 98 F (36.7 C) (Oral)   Resp 16   Wt 213 lb (96.6 kg)   SpO2 96%   BMI 28.89 kg/m  Physical Exam  Constitutional: He is oriented to person, place, and time. He appears well-developed and well-nourished. No distress.  HENT:  Head: Normocephalic and atraumatic.  Right Ear: External ear normal.  Left Ear: External ear normal.  Nose: Nose normal.  Mouth/Throat: Oropharynx is clear and moist.  Eyes: Pupils are equal, round, and reactive to light. Conjunctivae and EOM are normal.  Neck: Normal range of motion. Neck supple. Carotid bruit is not present. No thyromegaly present.  Cardiovascular: Normal rate, regular rhythm, normal heart sounds and intact distal pulses. Exam reveals no gallop and no friction rub.  No murmur heard. Pulmonary/Chest: Effort normal and breath sounds normal. He has no wheezes. He has no rales.  Abdominal: Soft. Bowel sounds are normal. He exhibits no distension and no mass. There is no tenderness. There is no rebound and no guarding.  Musculoskeletal:       Right shoulder: Normal.       Left shoulder: Normal.       Cervical back: Normal.  Lymphadenopathy:    He has no cervical adenopathy.  Neurological: He is alert and oriented to person, place, and time. He has normal reflexes. No cranial nerve deficit. He exhibits normal muscle tone. Coordination normal.  Skin: Skin is warm and dry. No  rash noted. He is not diaphoretic.  Psychiatric: He has a normal mood and affect. His behavior is normal. Judgment and thought content normal.   No results found. Depression screen [facility] 2/9 02/04/2018 01/12/2018 12/22/2017 12/10/2017 11/29/2017  Decreased Interest 1 1 1  0 0  Down, Depressed, Hopeless 1 1 1  0 0  PHQ - 2 Score 2 2 2  0 0  Altered sleeping 1 0 0 - -  Tired, decreased energy 1 1 0 - -  Change in appetite 1 0 0 - -  Feeling bad or failure about yourself  1 0 0 - -  Trouble concentrating - 0 0 - -  Moving slowly or fidgety/restless 0 1 0 - -  Suicidal thoughts 0 0 0 - -  PHQ-9 Score 6 4 2  - -  Some recent data might be hidden   Fall Risk  02/04/2018 01/12/2018 01/10/2018 12/22/2017 12/10/2017  Falls in the past year? Yes Yes Yes Yes Yes  Comment - - - - -  Number falls in past yr: 2 or more 2 or more 2 or more 2 or more 2 or more  Comment - - - - -  Injury with Fall? - Yes No No No  Comment - - - - -  Risk Factor Category  - - High Fall Risk - -  Risk for fall due to : - - - - -  Risk for fall due to: Comment - - - - -  Follow up - - - - -  Comment - - - - -  Assessment & Plan:   1. Vitamin B12 deficiency   2. Hypogonadism in male   3. Chronic respiratory failure with hypoxia (HCC)   4. Drug-induced constipation   5. Erythrocytosis   6. Neurogenic claudication due to lumbar spinal stenosis   7. Dual implantable cardioverter-defibrillator in situ   8. cHistory of testicular cancer   9. Chronic bilateral low back pain with bilateral sciatica   10. Atherosclerosis of native coronary artery of native heart without angina pectoris   11. COMBINED HEART FAILURE, CHRONIC   12. Central sleep apnea     Vitamin B12 deficiency: Uncontrolled.  Status post B12 injection in office.  Recommend weekly vitamin B12 injections for 4 weeks followed by 1 injection monthly.  New prescription provided.  Hypogonadism with a history of testicular cancer: Managed by urology.  Patient is  requesting testosterone prescription today yet declined to provide that prescription.  Testosterone is a controlled substance and with patient's complex medical history, he should only receive it from urology.  Patient expressed understanding.  Per [place] Controlled Substance Registry, last testosterone filled 08/10/17 and prescribed by [facility].  Nausea with vomiting: Much improved and now resolved.  I advised patient to contact [facility] gastroenterology to schedule appointment.  Drug-induced constipation: Due to chronic opiate use.  Continue lactulose therapy as needed.  Chronic lower back pain with current lower extremity weakness: Status post neurology consultation.  Continue gabapentin therapy.  Patient suffering with hypersomnolence with gabapentin yet control symptoms very well.  Considering decreasing dose of benzo or other sedating medications.  Coronary artery disease and combined heart failure: With dual implantable cardioverter defibrillator.  Followed by cardiology.  Euvolemic today and asymptomatic.  Erythrocytosis: Status post hematology consult.  No evidence of underlying malignancy.  Felt to be due to testosterone supplementation and obstructive sleep apnea with chronic hypoxia.  Status post bone marrow biopsy.  No orders of the defined types were placed in this encounter.  Meds ordered this encounter  Medications  . cyanocobalamin (,VITAMIN B-12,) 1000 MCG/ML injection    Sig: Inject 1 mL (1,000 mcg total) into the muscle once a week. For 4 weeks and then once per month    Dispense:  10 mL    Refill:  3  . cyanocobalamin ((VITAMIN B-12)) injection 1,000 mcg    Return in about 3 months (around 05/07/2018) for follow-up chronic medical conditions [place].   Linnet Bottari Elayne Guerin, M.D. Primary Care at [facility] previously [facility] [address] [place], [place]  [address] [phone] phone [phone] fax

## 2018-02-04 NOTE — Patient Instructions (Signed)
     IF you received an x-ray today, you will receive an invoice from [facility]. Please contact  Radiology at [phone] with questions or concerns regarding your invoice.   IF you received labwork today, you will receive an invoice from [facility]. Please contact [facility] at [phone] with questions or concerns regarding your invoice.   Our billing staff will not be able to assist you with questions regarding bills from these companies.  You will be contacted with the lab results as soon as they are available. The fastest way to get your results is to activate your My Chart account. Instructions are located on the last page of this paperwork. If you have not heard from us regarding the results in 2 weeks, please contact this office.     

## 2018-02-10 DIAGNOSIS — G4737 Central sleep apnea in conditions classified elsewhere: Secondary | ICD-10-CM | POA: Diagnosis not present

## 2018-02-10 DIAGNOSIS — G4733 Obstructive sleep apnea (adult) (pediatric): Secondary | ICD-10-CM | POA: Diagnosis not present

## 2018-02-14 DIAGNOSIS — F4322 Adjustment disorder with anxiety: Secondary | ICD-10-CM | POA: Diagnosis not present

## 2018-02-14 NOTE — Progress Notes (Unsigned)
Rcvd after hours phone message. Pt called answering service at 9:29pm on 02/13/18.  Pt states he fell asleep on Friday, when he woke up, he was unable to use his left hand. Was able to move his fingers, but numbness and tingling in thumb. Pt was instructed to call EMS. Pt stated he called EMS earlier, but chose not to be transported to the ER, because they told him he would sit there for hours.

## 2018-02-16 ENCOUNTER — Telehealth: Payer: Self-pay | Admitting: Neurology

## 2018-02-16 NOTE — Telephone Encounter (Signed)
Patient called experiencing some numbness in his wrist. He wanted to know if he needed to be seen by Dr. Loretta Plume. He wanted me to make him an appointment but had some questions concerning his symptoms. Please call him at [phone]. Thanks.

## 2018-02-16 NOTE — Telephone Encounter (Signed)
Called and spoke with Pt. He states same problem as when he called EMS. He states he did not refuse transport.  I talked with Dr Tomi Likens, he suggests Pt to make appt. Pt has appt 02/21/18

## 2018-02-18 NOTE — Progress Notes (Deleted)
NEUROLOGY FOLLOW UP OFFICE NOTE  [name] [id number]  HISTORY OF PRESENT ILLNESS: [name] is a 57 year old right-handed male with chronic systolic heart failure s/p AICD, CAD, depression, narcotic and benzodiazepine dependence, chronic pain and history of testicular cancer whom I recently saw for lumbar stenosis presents today for hand numbness.    ***.  He has history of B12 deficiency.  Level from 12/22/17 was 297 and was advised to start injections.  ***  PAST MEDICAL HISTORY: Past Medical History:  Diagnosis Date  . Anxiety   . Automatic implantable cardiac defibrillator [facility]    Analyze ST  . Benign prostatic hypertrophy   . Benzodiazepine dependence (HCC)    chronic  . CAD (coronary artery disease)    Last cath 2/12. 3-v CAD. Failed PCI of distal RCAc  CATH [facility] 4/13 with DES to  LAD X2  . CHF (congestive heart failure) ([place])   . Chronic back pain    lumbar stenosis  . Chronic systolic heart failure (HCC)    EF 20-25%. s/p ST. Jude ICD  . Depression   . DJD (degenerative joint disease)   . History of testicular cancer   . Narcotic dependence (HCC)    chronic  . Vitamin B12 deficiency 07/22/2016    MEDICATIONS: Current Outpatient Medications on File Prior to Visit  Medication Sig Dispense Refill  . albuterol (VENTOLIN HFA) 108 (90 Base) MCG/ACT inhaler Inhale 2 puffs into the lungs every 6 (six) hours as needed for wheezing or shortness of breath. 18 g 0  . alfuzosin (UROXATRAL) 10 MG 24 hr tablet Take 10 mg by mouth as needed. For UTI    . ALPRAZolam (XANAX) 1 MG tablet Take by mouth.    Marland Kitchen aspirin 325 MG tablet Take 325 mg by mouth daily.    . carvedilol (COREG) 25 MG tablet TAKE 1 TABLET TWICE DAILY WITH A MEAL. 180 tablet 3  . clopidogrel (PLAVIX) 75 MG tablet TAKE 1 TABLET EACH DAY. 90 tablet 0  . cyanocobalamin (,VITAMIN B-12,) 1000 MCG/ML injection Inject 1 mL (1,000 mcg total) into the muscle once a week. For 4 weeks and then once per month 10  mL 3  . diazepam (VALIUM) 10 MG tablet   2  . ezetimibe (ZETIA) 10 MG tablet TAKE 1 TABLET EACH DAY. 90 tablet 3  . gabapentin (NEURONTIN) 300 MG capsule Take 3 capsules (900 mg total) by mouth 3 (three) times daily. 270 capsule 5  . lactulose (CHRONULAC) 10 GM/15ML solution Take 15 mLs (10 g total) by mouth 2 (two) times daily as needed for mild constipation. 1892 mL 0  . lisinopril (PRINIVIL,ZESTRIL) 5 MG tablet Take 1 tablet (5 mg total) by mouth 2 (two) times daily. 180 tablet 0  . LORazepam (ATIVAN) 1 MG tablet Take 1 mg by mouth 4 (four) times daily as needed for anxiety or sleep. For anxiety    . morphine (MSIR) 30 MG tablet Take 1 tablet (30 mg total) by mouth every 6 (six) hours as needed for severe pain. 120 tablet 0  . nitroGLYCERIN (NITROLINGUAL) 0.4 MG/SPRAY spray Place under the tongue.    . ondansetron (ZOFRAN-ODT) 8 MG disintegrating tablet Take 1 tablet (8 mg total) by mouth every 8 (eight) hours as needed for nausea or vomiting. 90 tablet 1  . pantoprazole (PROTONIX) 40 MG tablet Take 1 tablet (40 mg total) by mouth daily. 90 tablet 3  . polyethylene glycol powder (GLYCOLAX/MIRALAX) powder MIX 1 CAPFUL  TWICE DAILY AS NEEDED. 250 g 0  . rosuvastatin (CRESTOR) 40 MG tablet TAKE 1 TABLET DAILY. 90 tablet 3  . sildenafil (REVATIO) 20 MG tablet Take by mouth.    . spironolactone (ALDACTONE) 25 MG tablet TAKE (1/2) TABLET DAILY. 45 tablet 0  . sucralfate (CARAFATE) 1 g tablet Take 1 tablet (1 g total) by mouth 4 (four) times daily -  with meals and at bedtime. 120 tablet 0  . tamsulosin (FLOMAX) 0.4 MG CAPS capsule Take 1 capsule (0.4 mg total) by mouth daily after breakfast. 90 capsule 0  . testosterone cypionate (DEPOTESTOTERONE CYPIONATE) 200 MG/ML injection Inject into the muscle every 14 (fourteen) days.      Marland Kitchen torsemide (DEMADEX) 20 MG tablet Take 1 tablet (20 mg total) by mouth daily as needed. For swelling in legs 90 tablet 2  . traZODone (DESYREL) 100 MG tablet Take 100 mg by  mouth at bedtime as needed for sleep.     Marland Kitchen triamcinolone (NASACORT AQ) 55 MCG/ACT AERO nasal inhaler Place 1 spray into the nose daily. 1 Inhaler 12  . VIAGRA 100 MG tablet TAKE 1 TABLET APPROXIMATELY 1 HOUR BEFORE NEEDING AS DIRECTED. 4 tablet 0  . zolpidem (AMBIEN) 10 MG tablet Take 1 tablet by mouth at bedtime as needed for sleep.   4   Current Facility-Administered Medications on File Prior to Visit  Medication Dose Route Frequency Provider Last Rate Last Dose  . cyanocobalamin ((VITAMIN B-12)) injection 1,000 mcg  1,000 mcg Intramuscular Q30 days Wardell Honour, MD   1,000 mcg at 02/04/18 1455    ALLERGIES: Allergies  Allergen Reactions  . Darvocet [Propoxyphene N-Acetaminophen] Anaphylaxis    Throat closes  . Propoxyphene Anaphylaxis and Swelling    FAMILY HISTORY: Family History  Problem Relation Age of Onset  . Heart disease Father 3       Died of MI  . Cancer Mother    ***.  SOCIAL HISTORY: Social History   Socioeconomic History  . Marital status: Married    Spouse name: Not on file  . Number of children: 3  . Years of education: Not on file  . Highest education level: Not on file  Occupational History  . Not on file  Social Needs  . Financial resource strain: Not on file  . Food insecurity:    Worry: Not on file    Inability: Not on file  . Transportation needs:    Medical: Not on file    Non-medical: Not on file  Tobacco Use  . Smoking status: Current Every Day Smoker    Packs/day: 0.50    Years: 29.00    Pack years: 14.50    Types: Cigarettes, E-cigarettes    Last attempt to quit: 07/14/2015    Years since quitting: 2.6  . Smokeless tobacco: Never Used  Substance and Sexual Activity  . Alcohol use: No    Alcohol/week: 0.0 standard drinks  . Drug use: Yes    Types: Marijuana    Comment: Urine showed THC  . Sexual activity: Not on file  Lifestyle  . Physical activity:    Days per week: Not on file    Minutes per session: Not on file  .  Stress: Not on file  Relationships  . Social connections:    Talks on phone: Not on file    Gets together: Not on file    Attends religious service: Not on file    Active member of club or organization: Not on  file    Attends meetings of clubs or organizations: Not on file    Relationship status: Not on file  . Intimate partner violence:    Fear of current or ex partner: Not on file    Emotionally abused: Not on file    Physically abused: Not on file    Forced sexual activity: Not on file  Other Topics Concern  . Not on file  Social History Narrative   Marital status: married x 12 years; wife is severe alcoholic.      Children: 3 married daughters (23, 16, 53); 1 son (23yo); 3 grandchildren born in 2017      Lives: with wife, son      Left-handed   Caffeine: 3 drinks per day    REVIEW OF SYSTEMS: Constitutional: No fevers, chills, or sweats, no generalized fatigue, change in appetite Eyes: No visual changes, double vision, eye pain Ear, nose and throat: No hearing loss, ear pain, nasal congestion, sore throat Cardiovascular: No chest pain, palpitations Respiratory:  No shortness of breath at rest or with exertion, wheezes GastrointestinaI: No nausea, vomiting, diarrhea, abdominal pain, fecal incontinence Genitourinary:  No dysuria, urinary retention or frequency Musculoskeletal:  No neck pain, back pain Integumentary: No rash, pruritus, skin lesions Neurological: as above Psychiatric: No depression, insomnia, anxiety Endocrine: No palpitations, fatigue, diaphoresis, mood swings, change in appetite, change in weight, increased thirst Hematologic/Lymphatic:  No purpura, petechiae. Allergic/Immunologic: no itchy/runny eyes, nasal congestion, recent allergic reactions, rashes  PHYSICAL EXAM: *** General: No acute distress.  Patient appears ***-groomed.  *** body habitus. Head:  Normocephalic/atraumatic Eyes:  Fundi examined but not visualized Neck: supple, no paraspinal  tenderness, full range of motion Heart:  Regular rate and rhythm Lungs:  Clear to auscultation bilaterally Back: No paraspinal tenderness Neurological Exam: alert and oriented to person, place, and time. Attention span and concentration intact, recent and remote memory intact, fund of knowledge intact.  Speech fluent and not dysarthric, language intact.  CN II-XII intact. Bulk and tone normal, muscle strength 5/5 throughout.  Sensation to light touch, temperature and vibration intact.  Deep tendon reflexes 2+ throughout, toes downgoing.  Finger to nose and heel to shin testing intact.  Gait normal, Romberg negative.  IMPRESSION: ***  PLAN: ***  Metta Clines, DO  CC: ***

## 2018-02-21 ENCOUNTER — Ambulatory Visit: Payer: Self-pay | Admitting: Neurology

## 2018-02-21 NOTE — Progress Notes (Signed)
NEUROLOGY FOLLOW UP OFFICE NOTE  [name] [id number]  HISTORY OF PRESENT ILLNESS: [name] is a 57 year old left-handed male with chronic systolic heart failure s/p AICD, CAD, depression, narcotic and benzodiazepine dependce, chronic pain and history of testicular cancer whom I previously saw memory problems and more recently for lumbar radiculopathy presents for left hand weakness and numbness.  Last Friday, he fell asleep at the dinner table while watching TV.  When he woke up, he could not move his left hand.  His wrist was limp.  There was some numbness in his thumb.  There was no neck pain or pain radiating down the arm.  He has history of confusion and memory deficits, so it is not quite clear what happened.  However, he said that his son found him on the floor unresponsive.  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  He declined going to the ED because he didn't have anyone to take care of his son.  He still has left hand weakness but numbness in the thumb resolved.  He is concerned because he reports history of multiple TIAs.  PAST MEDICAL HISTORY: Past Medical History:  Diagnosis Date  . Anxiety   . Automatic implantable cardiac defibrillator [facility]    Analyze ST  . Benign prostatic hypertrophy   . Benzodiazepine dependence (HCC)    chronic  . CAD (coronary artery disease)    Last cath 2/12. 3-v CAD. Failed PCI of distal RCAc  CATH [facility] 4/13 with DES to  LAD X2  . CHF (congestive heart failure) ([place])   . Chronic back pain    lumbar stenosis  . Chronic systolic heart failure (HCC)    EF 20-25%. s/p ST. Jude ICD  . Depression   . DJD (degenerative joint disease)   . History of testicular cancer   . Narcotic dependence (HCC)    chronic  . Vitamin B12 deficiency 07/22/2016    MEDICATIONS: Current Outpatient Medications on File Prior to Visit  Medication Sig Dispense Refill  . albuterol (VENTOLIN HFA) 108 (90  Base) MCG/ACT inhaler Inhale 2 puffs into the lungs every 6 (six) hours as needed for wheezing or shortness of breath. 18 g 0  . alfuzosin (UROXATRAL) 10 MG 24 hr tablet Take 10 mg by mouth as needed. For UTI    . ALPRAZolam (XANAX) 1 MG tablet Take by mouth.    Marland Kitchen aspirin 325 MG tablet Take 325 mg by mouth daily.    . carvedilol (COREG) 25 MG tablet TAKE 1 TABLET TWICE DAILY WITH A MEAL. 180 tablet 3  . clopidogrel (PLAVIX) 75 MG tablet TAKE 1 TABLET EACH DAY. 90 tablet 0  . cyanocobalamin (,VITAMIN B-12,) 1000 MCG/ML injection Inject 1 mL (1,000 mcg total) into the muscle once a week. For 4 weeks and then once per month 10 mL 3  . diazepam (VALIUM) 10 MG tablet   2  . ezetimibe (ZETIA) 10 MG tablet TAKE 1 TABLET EACH DAY. 90 tablet 3  . gabapentin (NEURONTIN) 300 MG capsule Take 3 capsules (900 mg total) by mouth 3 (three) times daily. 270 capsule 5  . lactulose (CHRONULAC) 10 GM/15ML solution Take 15 mLs (10 g total) by mouth 2 (two) times daily as needed for mild constipation. 1892 mL 0  . lisinopril (PRINIVIL,ZESTRIL) 5 MG tablet Take 1 tablet (5 mg total) by mouth 2 (two) times daily. 180 tablet 0  .  LORazepam (ATIVAN) 1 MG tablet Take 1 mg by mouth 4 (four) times daily as needed for anxiety or sleep. For anxiety    . morphine (MSIR) 30 MG tablet Take 1 tablet (30 mg total) by mouth every 6 (six) hours as needed for severe pain. 120 tablet 0  . nitroGLYCERIN (NITROLINGUAL) 0.4 MG/SPRAY spray Place under the tongue.    . ondansetron (ZOFRAN-ODT) 8 MG disintegrating tablet Take 1 tablet (8 mg total) by mouth every 8 (eight) hours as needed for nausea or vomiting. 90 tablet 1  . pantoprazole (PROTONIX) 40 MG tablet Take 1 tablet (40 mg total) by mouth daily. 90 tablet 3  . polyethylene glycol powder (GLYCOLAX/MIRALAX) powder MIX 1 CAPFUL TWICE DAILY AS NEEDED. 250 g 0  . rosuvastatin (CRESTOR) 40 MG tablet TAKE 1 TABLET DAILY. 90 tablet 3  . sildenafil (REVATIO) 20 MG tablet Take by mouth.    .  spironolactone (ALDACTONE) 25 MG tablet TAKE (1/2) TABLET DAILY. 45 tablet 0  . sucralfate (CARAFATE) 1 g tablet Take 1 tablet (1 g total) by mouth 4 (four) times daily -  with meals and at bedtime. 120 tablet 0  . tamsulosin (FLOMAX) 0.4 MG CAPS capsule Take 1 capsule (0.4 mg total) by mouth daily after breakfast. 90 capsule 0  . testosterone cypionate (DEPOTESTOTERONE CYPIONATE) 200 MG/ML injection Inject into the muscle every 14 (fourteen) days.      Marland Kitchen torsemide (DEMADEX) 20 MG tablet Take 1 tablet (20 mg total) by mouth daily as needed. For swelling in legs 90 tablet 2  . traZODone (DESYREL) 100 MG tablet Take 100 mg by mouth at bedtime as needed for sleep.     Marland Kitchen triamcinolone (NASACORT AQ) 55 MCG/ACT AERO nasal inhaler Place 1 spray into the nose daily. 1 Inhaler 12  . VIAGRA 100 MG tablet TAKE 1 TABLET APPROXIMATELY 1 HOUR BEFORE NEEDING AS DIRECTED. 4 tablet 0  . zolpidem (AMBIEN) 10 MG tablet Take 1 tablet by mouth at bedtime as needed for sleep.   4   Current Facility-Administered Medications on File Prior to Visit  Medication Dose Route Frequency Provider Last Rate Last Dose  . cyanocobalamin ((VITAMIN B-12)) injection 1,000 mcg  1,000 mcg Intramuscular Q30 days Wardell Honour, MD   1,000 mcg at 02/04/18 1455    ALLERGIES: Allergies  Allergen Reactions  . Darvocet [Propoxyphene N-Acetaminophen] Anaphylaxis    Throat closes  . Propoxyphene Anaphylaxis and Swelling    FAMILY HISTORY: Family History  Problem Relation Age of Onset  . Heart disease Father 72       Died of MI  . Cancer Mother     SOCIAL HISTORY: Social History   Socioeconomic History  . Marital status: Married    Spouse name: Not on file  . Number of children: 3  . Years of education: Not on file  . Highest education level: Not on file  Occupational History  . Not on file  Social Needs  . Financial resource strain: Not on file  . Food insecurity:    Worry: Not on file    Inability: Not on file  .  Transportation needs:    Medical: Not on file    Non-medical: Not on file  Tobacco Use  . Smoking status: Current Every Day Smoker    Packs/day: 0.50    Years: 29.00    Pack years: 14.50    Types: Cigarettes, E-cigarettes    Last attempt to quit: 07/14/2015    Years since quitting:  2.6  . Smokeless tobacco: Never Used  Substance and Sexual Activity  . Alcohol use: No    Alcohol/week: 0.0 standard drinks  . Drug use: Yes    Types: Marijuana    Comment: Urine showed THC  . Sexual activity: Not on file  Lifestyle  . Physical activity:    Days per week: Not on file    Minutes per session: Not on file  . Stress: Not on file  Relationships  . Social connections:    Talks on phone: Not on file    Gets together: Not on file    Attends religious service: Not on file    Active member of club or organization: Not on file    Attends meetings of clubs or organizations: Not on file    Relationship status: Not on file  . Intimate partner violence:    Fear of current or ex partner: Not on file    Emotionally abused: Not on file    Physically abused: Not on file    Forced sexual activity: Not on file  Other Topics Concern  . Not on file  Social History Narrative   Marital status: married x 12 years; wife is severe alcoholic.      Children: 3 married daughters (23, 65, 66); 1 son (77yo); 3 grandchildren born in 2017      Lives: with wife, son      Left-handed   Caffeine: 3 drinks per day    REVIEW OF SYSTEMS: Constitutional: No fevers, chills, or sweats, no generalized fatigue, change in appetite Eyes: No visual changes, double vision, eye pain Ear, nose and throat: No hearing loss, ear pain, nasal congestion, sore throat Cardiovascular: No chest pain, palpitations Respiratory:  No shortness of breath at rest or with exertion, wheezes GastrointestinaI: No nausea, vomiting, diarrhea, abdominal pain, fecal incontinence Genitourinary:  No dysuria, urinary retention or  frequency Musculoskeletal:  No neck pain, back pain Integumentary: No rash, pruritus, skin lesions Neurological: as above Psychiatric: No depression, insomnia, anxiety Endocrine: No palpitations, fatigue, diaphoresis, mood swings, change in appetite, change in weight, increased thirst Hematologic/Lymphatic:  No purpura, petechiae. Allergic/Immunologic: no itchy/runny eyes, nasal congestion, recent allergic reactions, rashes  PHYSICAL EXAM: Blood pressure 140/72, pulse 85, height 5' 11"  (1.803 m), weight 208 lb (94.3 kg), SpO2 92 %. General: No acute distress.   Head:  Normocephalic/atraumatic Eyes:  Fundi examined but not visualized Neck: supple, no paraspinal tenderness, full range of motion Heart:  Regular rate and rhythm Lungs:  Clear to auscultation bilaterally Back: No paraspinal tenderness Neurological Exam: alert and oriented to person, place, and time. Attention span and concentration intact, recent and remote memory intact, fund of knowledge intact.  Speech fluent and not dysarthric, language intact.  CN II-XII intact. Bulk and tone normal, 1/5 left wrist extension, otherwise muscle strength 5/5 throughout (including left triceps, finger flexion, wrist flexion, finger abduction, thumb.  Sensation to pinprick and vibration intact.  Deep tendon reflexes 2+ throughout, toes downgoing.  Finger to nose and heel to shin testing intact.  Gait normal, Romberg negative.  IMPRESSION: 1.  Left radial nerve palsy.  2.  Loss of consciousness.  Unclear history. He has history of recurrent episodes of transient altered awareness.  He has had neurologic workup.  Prior EEG has been unremarkable.  He reports low O2 sats.  May not be neurologic.  PLAN: 1.  We will schedule NCV-EMG of left upper extremity in 3 weeks to further evaluate for radial nerve palsy.  2.  In the meantime, he will wear wrist splint 3.  Given that he had another episode of transient altered awareness and was found on the  floor, we will check CT of head to evaluate for any subacute changes that could explain event. 4.  Follow up after testing.  25 minutes spent face to face with patient, over 50% spent discussing management.  Metta Clines, DO  CC: Dr. Tamala Julian

## 2018-02-22 ENCOUNTER — Encounter: Payer: Self-pay | Admitting: Neurology

## 2018-02-22 ENCOUNTER — Ambulatory Visit (INDEPENDENT_AMBULATORY_CARE_PROVIDER_SITE_OTHER): Payer: BLUE CROSS/BLUE SHIELD | Admitting: Neurology

## 2018-02-22 VITALS — BP 140/72 | HR 85 | Ht 71.0 in | Wt 208.0 lb

## 2018-02-22 DIAGNOSIS — G5632 Lesion of radial nerve, left upper limb: Secondary | ICD-10-CM

## 2018-02-22 DIAGNOSIS — R4020 Unspecified coma: Secondary | ICD-10-CM

## 2018-02-22 NOTE — Patient Instructions (Addendum)
1.  We will check CT of the head without contrast 2.  We will order a nerve conduction study/EMG of the left arm in 3 weeks. 3.  Follow up after testing  We have sent a referral to [facility] for your CT and they will call you directly to schedule your appt. They are located at [place]. If you need to contact them directly please call [phone].

## 2018-02-24 ENCOUNTER — Telehealth: Payer: Self-pay | Admitting: Family Medicine

## 2018-02-24 NOTE — Telephone Encounter (Signed)
Called patient in regards to his appt he has with Dr. Nolon Rod on 03/24/2018. The provider will not be in the office and will need to be rescheduled.Did not leave a message because he did not have a DPR on file.

## 2018-03-06 DIAGNOSIS — G4733 Obstructive sleep apnea (adult) (pediatric): Secondary | ICD-10-CM | POA: Diagnosis not present

## 2018-03-11 ENCOUNTER — Emergency Department (HOSPITAL_COMMUNITY): Payer: BLUE CROSS/BLUE SHIELD

## 2018-03-11 ENCOUNTER — Inpatient Hospital Stay (HOSPITAL_COMMUNITY)
Admission: EM | Admit: 2018-03-11 | Discharge: 2018-03-14 | DRG: 193 | Disposition: A | Discharge status: Home / Self Care | Payer: BLUE CROSS/BLUE SHIELD | Attending: Student in an Organized Health Care Education/Training Program | Admitting: Student in an Organized Health Care Education/Training Program

## 2018-03-11 ENCOUNTER — Encounter (HOSPITAL_COMMUNITY): Payer: Self-pay | Admitting: Emergency Medicine

## 2018-03-11 ENCOUNTER — Inpatient Hospital Stay (HOSPITAL_COMMUNITY): Payer: BLUE CROSS/BLUE SHIELD

## 2018-03-11 ENCOUNTER — Other Ambulatory Visit: Payer: Self-pay

## 2018-03-11 DIAGNOSIS — R0689 Other abnormalities of breathing: Secondary | ICD-10-CM | POA: Diagnosis not present

## 2018-03-11 DIAGNOSIS — Z79891 Long term (current) use of opiate analgesic: Secondary | ICD-10-CM

## 2018-03-11 DIAGNOSIS — R0902 Hypoxemia: Secondary | ICD-10-CM | POA: Diagnosis present

## 2018-03-11 DIAGNOSIS — J181 Lobar pneumonia, unspecified organism: Principal | ICD-10-CM | POA: Diagnosis present

## 2018-03-11 DIAGNOSIS — Z7982 Long term (current) use of aspirin: Secondary | ICD-10-CM | POA: Diagnosis not present

## 2018-03-11 DIAGNOSIS — Z9981 Dependence on supplemental oxygen: Secondary | ICD-10-CM

## 2018-03-11 DIAGNOSIS — I739 Peripheral vascular disease, unspecified: Secondary | ICD-10-CM | POA: Diagnosis not present

## 2018-03-11 DIAGNOSIS — I5023 Acute on chronic systolic (congestive) heart failure: Secondary | ICD-10-CM | POA: Diagnosis not present

## 2018-03-11 DIAGNOSIS — N400 Enlarged prostate without lower urinary tract symptoms: Secondary | ICD-10-CM | POA: Diagnosis not present

## 2018-03-11 DIAGNOSIS — E669 Obesity, unspecified: Secondary | ICD-10-CM | POA: Diagnosis present

## 2018-03-11 DIAGNOSIS — I5043 Acute on chronic combined systolic (congestive) and diastolic (congestive) heart failure: Secondary | ICD-10-CM | POA: Diagnosis not present

## 2018-03-11 DIAGNOSIS — I110 Hypertensive heart disease with heart failure: Secondary | ICD-10-CM | POA: Diagnosis not present

## 2018-03-11 DIAGNOSIS — Z7902 Long term (current) use of antithrombotics/antiplatelets: Secondary | ICD-10-CM

## 2018-03-11 DIAGNOSIS — J440 Chronic obstructive pulmonary disease with acute lower respiratory infection: Secondary | ICD-10-CM | POA: Diagnosis present

## 2018-03-11 DIAGNOSIS — R079 Chest pain, unspecified: Secondary | ICD-10-CM | POA: Diagnosis not present

## 2018-03-11 DIAGNOSIS — G4731 Primary central sleep apnea: Secondary | ICD-10-CM | POA: Diagnosis not present

## 2018-03-11 DIAGNOSIS — F410 Panic disorder [episodic paroxysmal anxiety] without agoraphobia: Secondary | ICD-10-CM | POA: Diagnosis present

## 2018-03-11 DIAGNOSIS — F329 Major depressive disorder, single episode, unspecified: Secondary | ICD-10-CM | POA: Diagnosis present

## 2018-03-11 DIAGNOSIS — I2510 Atherosclerotic heart disease of native coronary artery without angina pectoris: Secondary | ICD-10-CM | POA: Diagnosis not present

## 2018-03-11 DIAGNOSIS — I361 Nonrheumatic tricuspid (valve) insufficiency: Secondary | ICD-10-CM | POA: Diagnosis not present

## 2018-03-11 DIAGNOSIS — F411 Generalized anxiety disorder: Secondary | ICD-10-CM | POA: Diagnosis present

## 2018-03-11 DIAGNOSIS — G4733 Obstructive sleep apnea (adult) (pediatric): Secondary | ICD-10-CM | POA: Diagnosis present

## 2018-03-11 DIAGNOSIS — F419 Anxiety disorder, unspecified: Secondary | ICD-10-CM | POA: Diagnosis not present

## 2018-03-11 DIAGNOSIS — R072 Precordial pain: Secondary | ICD-10-CM | POA: Diagnosis not present

## 2018-03-11 DIAGNOSIS — F1120 Opioid dependence, uncomplicated: Secondary | ICD-10-CM | POA: Diagnosis not present

## 2018-03-11 DIAGNOSIS — R231 Pallor: Secondary | ICD-10-CM | POA: Diagnosis not present

## 2018-03-11 DIAGNOSIS — Z8611 Personal history of tuberculosis: Secondary | ICD-10-CM | POA: Diagnosis not present

## 2018-03-11 DIAGNOSIS — I255 Ischemic cardiomyopathy: Secondary | ICD-10-CM | POA: Diagnosis not present

## 2018-03-11 DIAGNOSIS — M21332 Wrist drop, left wrist: Secondary | ICD-10-CM | POA: Diagnosis present

## 2018-03-11 DIAGNOSIS — G8929 Other chronic pain: Secondary | ICD-10-CM

## 2018-03-11 DIAGNOSIS — J80 Acute respiratory distress syndrome: Secondary | ICD-10-CM | POA: Diagnosis not present

## 2018-03-11 DIAGNOSIS — Z22322 Carrier or suspected carrier of Methicillin resistant Staphylococcus aureus: Secondary | ICD-10-CM

## 2018-03-11 DIAGNOSIS — F1320 Sedative, hypnotic or anxiolytic dependence, uncomplicated: Secondary | ICD-10-CM | POA: Diagnosis not present

## 2018-03-11 DIAGNOSIS — Z95810 Presence of automatic (implantable) cardiac defibrillator: Secondary | ICD-10-CM

## 2018-03-11 DIAGNOSIS — Z720 Tobacco use: Secondary | ICD-10-CM

## 2018-03-11 DIAGNOSIS — Z8249 Family history of ischemic heart disease and other diseases of the circulatory system: Secondary | ICD-10-CM

## 2018-03-11 DIAGNOSIS — M25559 Pain in unspecified hip: Secondary | ICD-10-CM | POA: Diagnosis not present

## 2018-03-11 DIAGNOSIS — J9621 Acute and chronic respiratory failure with hypoxia: Secondary | ICD-10-CM | POA: Diagnosis not present

## 2018-03-11 DIAGNOSIS — J9601 Acute respiratory failure with hypoxia: Secondary | ICD-10-CM | POA: Diagnosis not present

## 2018-03-11 DIAGNOSIS — Z9989 Dependence on other enabling machines and devices: Secondary | ICD-10-CM | POA: Diagnosis not present

## 2018-03-11 DIAGNOSIS — I509 Heart failure, unspecified: Secondary | ICD-10-CM

## 2018-03-11 DIAGNOSIS — I5022 Chronic systolic (congestive) heart failure: Secondary | ICD-10-CM

## 2018-03-11 DIAGNOSIS — R296 Repeated falls: Secondary | ICD-10-CM | POA: Diagnosis present

## 2018-03-11 DIAGNOSIS — F17210 Nicotine dependence, cigarettes, uncomplicated: Secondary | ICD-10-CM | POA: Diagnosis present

## 2018-03-11 DIAGNOSIS — M25512 Pain in left shoulder: Secondary | ICD-10-CM | POA: Diagnosis present

## 2018-03-11 DIAGNOSIS — Z7951 Long term (current) use of inhaled steroids: Secondary | ICD-10-CM

## 2018-03-11 DIAGNOSIS — Z885 Allergy status to narcotic agent status: Secondary | ICD-10-CM

## 2018-03-11 DIAGNOSIS — M549 Dorsalgia, unspecified: Secondary | ICD-10-CM | POA: Diagnosis not present

## 2018-03-11 DIAGNOSIS — M25569 Pain in unspecified knee: Secondary | ICD-10-CM

## 2018-03-11 DIAGNOSIS — Z888 Allergy status to other drugs, medicaments and biological substances status: Secondary | ICD-10-CM

## 2018-03-11 DIAGNOSIS — Z79899 Other long term (current) drug therapy: Secondary | ICD-10-CM

## 2018-03-11 DIAGNOSIS — Z8547 Personal history of malignant neoplasm of testis: Secondary | ICD-10-CM

## 2018-03-11 DIAGNOSIS — R69 Illness, unspecified: Secondary | ICD-10-CM

## 2018-03-11 DIAGNOSIS — I5042 Chronic combined systolic (congestive) and diastolic (congestive) heart failure: Secondary | ICD-10-CM | POA: Diagnosis not present

## 2018-03-11 DIAGNOSIS — Z955 Presence of coronary angioplasty implant and graft: Secondary | ICD-10-CM

## 2018-03-11 DIAGNOSIS — G4737 Central sleep apnea in conditions classified elsewhere: Secondary | ICD-10-CM | POA: Diagnosis not present

## 2018-03-11 DIAGNOSIS — J811 Chronic pulmonary edema: Secondary | ICD-10-CM | POA: Diagnosis not present

## 2018-03-11 LAB — CBC WITH DIFFERENTIAL/PLATELET
Abs Immature Granulocytes: 0.1 10*3/uL (ref 0.0–0.1)
Basophils Absolute: 0.1 10*3/uL (ref 0.0–0.1)
Basophils Relative: 1 %
Eosinophils Absolute: 0.4 10*3/uL (ref 0.0–0.7)
Eosinophils Relative: 4 %
HCT: 53.6 % — ABNORMAL HIGH (ref 39.0–52.0)
Hemoglobin: 17.7 g/dL — ABNORMAL HIGH (ref 13.0–17.0)
Immature Granulocytes: 1 %
Lymphocytes Relative: 17 %
Lymphs Abs: 1.7 10*3/uL (ref 0.7–4.0)
MCH: 33.5 pg (ref 26.0–34.0)
MCHC: 33 g/dL (ref 30.0–36.0)
MCV: 101.5 fL — ABNORMAL HIGH (ref 78.0–100.0)
Monocytes Absolute: 0.5 10*3/uL (ref 0.1–1.0)
Monocytes Relative: 5 %
Neutro Abs: 6.9 10*3/uL (ref 1.7–7.7)
Neutrophils Relative %: 72 %
Platelets: 209 10*3/uL (ref 150–400)
RBC: 5.28 MIL/uL (ref 4.22–5.81)
RDW: 13.4 % (ref 11.5–15.5)
WBC: 9.5 10*3/uL (ref 4.0–10.5)

## 2018-03-11 LAB — BASIC METABOLIC PANEL
Anion gap: 14 (ref 5–15)
BUN: 14 mg/dL (ref 6–20)
CO2: 25 mmol/L (ref 22–32)
Calcium: 8.9 mg/dL (ref 8.9–10.3)
Chloride: 102 mmol/L (ref 98–111)
Creatinine, Ser: 1.15 mg/dL (ref 0.61–1.24)
GFR calc Af Amer: >60 mL/min (ref >60)
GFR calc non Af Amer: >60 mL/min (ref >60)
Glucose, Bld: 163 mg/dL — ABNORMAL HIGH (ref 70–99)
Potassium: 3.9 mmol/L (ref 3.5–5.1)
Sodium: 141 mmol/L (ref 135–145)

## 2018-03-11 LAB — PROTIME-INR
INR: 0.98
Prothrombin Time: 12.9 seconds (ref 11.4–15.2)

## 2018-03-11 LAB — BRAIN NATRIURETIC PEPTIDE: B Natriuretic Peptide: 771.5 pg/mL — ABNORMAL HIGH (ref 0.0–100.0)

## 2018-03-11 LAB — MRSA PCR SCREENING: MRSA by PCR: POSITIVE — AB

## 2018-03-11 LAB — I-STAT TROPONIN, ED: Troponin i, poc: 0 ng/mL (ref 0.00–0.08)

## 2018-03-11 MED ORDER — GABAPENTIN 300 MG PO CAPS
900.0000 mg | ORAL_CAPSULE | Freq: Three times a day (TID) | ORAL | Status: DC
  Administered 2018-03-11 – 2018-03-14 (×9): 900 mg via ORAL
  Filled 2018-03-11 (×10): qty 3

## 2018-03-11 MED ORDER — NICOTINE 14 MG/24HR TD PT24
14.0000 mg | MEDICATED_PATCH | Freq: Every day | TRANSDERMAL | Status: DC
  Administered 2018-03-11 – 2018-03-14 (×4): 14 mg via TRANSDERMAL
  Filled 2018-03-11 (×4): qty 1

## 2018-03-11 MED ORDER — IPRATROPIUM-ALBUTEROL 0.5-2.5 (3) MG/3ML IN SOLN
3.0000 mL | Freq: Once | RESPIRATORY_TRACT | Status: AC
  Administered 2018-03-11: 3 mL via RESPIRATORY_TRACT
  Filled 2018-03-11: qty 3

## 2018-03-11 MED ORDER — PANTOPRAZOLE SODIUM 40 MG PO TBEC
40.0000 mg | DELAYED_RELEASE_TABLET | Freq: Every day | ORAL | Status: DC
  Administered 2018-03-12 – 2018-03-14 (×3): 40 mg via ORAL
  Filled 2018-03-11 (×4): qty 1

## 2018-03-11 MED ORDER — DIAZEPAM 5 MG PO TABS
10.0000 mg | ORAL_TABLET | Freq: Two times a day (BID) | ORAL | Status: DC | PRN
  Administered 2018-03-12 – 2018-03-13 (×2): 10 mg via ORAL
  Filled 2018-03-11 (×3): qty 2

## 2018-03-11 MED ORDER — ALPRAZOLAM 0.5 MG PO TABS
1.0000 mg | ORAL_TABLET | Freq: Four times a day (QID) | ORAL | Status: DC | PRN
  Administered 2018-03-11 – 2018-03-14 (×6): 1 mg via ORAL
  Filled 2018-03-11 (×7): qty 2

## 2018-03-11 MED ORDER — ASPIRIN 325 MG PO TABS
325.0000 mg | ORAL_TABLET | Freq: Every day | ORAL | Status: DC
  Administered 2018-03-11 – 2018-03-14 (×4): 325 mg via ORAL
  Filled 2018-03-11 (×4): qty 1

## 2018-03-11 MED ORDER — METHADONE HCL 10 MG PO TABS
200.0000 mg | ORAL_TABLET | Freq: Every day | ORAL | Status: DC
  Filled 2018-03-11: qty 20

## 2018-03-11 MED ORDER — CARVEDILOL 12.5 MG PO TABS
12.5000 mg | ORAL_TABLET | Freq: Two times a day (BID) | ORAL | Status: DC
  Administered 2018-03-11 – 2018-03-14 (×6): 12.5 mg via ORAL
  Filled 2018-03-11 (×6): qty 1

## 2018-03-11 MED ORDER — ROSUVASTATIN CALCIUM 20 MG PO TABS
40.0000 mg | ORAL_TABLET | Freq: Every evening | ORAL | Status: DC
  Administered 2018-03-11 – 2018-03-13 (×3): 40 mg via ORAL
  Filled 2018-03-11: qty 2
  Filled 2018-03-11: qty 1
  Filled 2018-03-11 (×2): qty 2

## 2018-03-11 MED ORDER — METHADONE HCL 40 MG PO TBSO: 200.0000 mg | ORAL_TABLET | Freq: Every day | ORAL | Status: DC

## 2018-03-11 MED ORDER — LISINOPRIL 2.5 MG PO TABS
2.5000 mg | ORAL_TABLET | Freq: Every day | ORAL | Status: DC
  Administered 2018-03-11: 2.5 mg via ORAL
  Filled 2018-03-11: qty 1

## 2018-03-11 MED ORDER — METHADONE HCL 10 MG PO TABS
200.0000 mg | ORAL_TABLET | Freq: Every day | ORAL | Status: DC
  Administered 2018-03-11 – 2018-03-14 (×4): 200 mg via ORAL
  Filled 2018-03-11 (×4): qty 20

## 2018-03-11 MED ORDER — FUROSEMIDE 10 MG/ML IJ SOLN
60.0000 mg | Freq: Once | INTRAMUSCULAR | Status: AC
  Administered 2018-03-11: 60 mg via INTRAVENOUS
  Filled 2018-03-11: qty 6

## 2018-03-11 MED ORDER — IOPAMIDOL (ISOVUE-370) INJECTION 76%
100.0000 mL | Freq: Once | INTRAVENOUS | Status: AC | PRN
  Administered 2018-03-11: 100 mL via INTRAVENOUS

## 2018-03-11 MED ORDER — ENOXAPARIN SODIUM 40 MG/0.4ML ~~LOC~~ SOLN
40.0000 mg | SUBCUTANEOUS | Status: DC
  Administered 2018-03-11 – 2018-03-13 (×3): 40 mg via SUBCUTANEOUS
  Filled 2018-03-11 (×3): qty 0.4

## 2018-03-11 MED ORDER — FAMOTIDINE 20 MG PO TABS
10.0000 mg | ORAL_TABLET | Freq: Every day | ORAL | Status: DC
  Administered 2018-03-11 – 2018-03-13 (×3): 10 mg via ORAL
  Filled 2018-03-11 (×3): qty 1

## 2018-03-11 MED ORDER — IOPAMIDOL (ISOVUE-370) INJECTION 76%
INTRAVENOUS | Status: AC
  Filled 2018-03-11: qty 100

## 2018-03-11 MED ORDER — SPIRONOLACTONE 12.5 MG HALF TABLET
12.5000 mg | ORAL_TABLET | Freq: Every day | ORAL | Status: DC
  Administered 2018-03-12 – 2018-03-14 (×3): 12.5 mg via ORAL
  Filled 2018-03-11 (×3): qty 1

## 2018-03-11 MED ORDER — EZETIMIBE 10 MG PO TABS
10.0000 mg | ORAL_TABLET | Freq: Every day | ORAL | Status: DC
  Administered 2018-03-11 – 2018-03-14 (×4): 10 mg via ORAL
  Filled 2018-03-11 (×4): qty 1

## 2018-03-11 MED ORDER — CLOPIDOGREL BISULFATE 75 MG PO TABS
75.0000 mg | ORAL_TABLET | Freq: Every day | ORAL | Status: DC
  Administered 2018-03-11 – 2018-03-14 (×4): 75 mg via ORAL
  Filled 2018-03-11 (×4): qty 1
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
## 2018-03-11 NOTE — ED Notes (Signed)
Report called  
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
## 2018-03-11 NOTE — Progress Notes (Signed)
The patient was taken off the BIPAP at this time and placed on a 5lpm N/C. The patient is tolerating this well without any respiratory distress. The patient's o2 sats are 93% at this time. The patient does wear 5lpm N/C continuously at home. Will continue to monitor.

## 2018-03-11 NOTE — Progress Notes (Signed)
Patient arrived to the ED from EMS on CPAP. The patient was placed on the BIPAP at 16/8 and 60% FIO2. Will continue to monitor the patient.

## 2018-03-11 NOTE — Progress Notes (Signed)
Needed a order clarification on pt dosage of methadone pt is to get 200mg  per order as it was clarified from Dr. Robby Sermon who returned my page, will give dose as ordered.

## 2018-03-12 ENCOUNTER — Inpatient Hospital Stay (HOSPITAL_COMMUNITY): Payer: BLUE CROSS/BLUE SHIELD

## 2018-03-12 DIAGNOSIS — Z22322 Carrier or suspected carrier of Methicillin resistant Staphylococcus aureus: Secondary | ICD-10-CM

## 2018-03-12 DIAGNOSIS — I361 Nonrheumatic tricuspid (valve) insufficiency: Secondary | ICD-10-CM

## 2018-03-12 DIAGNOSIS — I5043 Acute on chronic combined systolic (congestive) and diastolic (congestive) heart failure: Secondary | ICD-10-CM

## 2018-03-12 LAB — RESPIRATORY PANEL BY PCR

## 2018-03-12 LAB — ECHOCARDIOGRAM COMPLETE
Height: 71 in
Weight: 3264 oz

## 2018-03-12 LAB — BASIC METABOLIC PANEL
Anion gap: 4 — ABNORMAL LOW (ref 5–15)
BUN: 16 mg/dL (ref 6–20)
CO2: 35 mmol/L — ABNORMAL HIGH (ref 22–32)
Calcium: 8.8 mg/dL — ABNORMAL LOW (ref 8.9–10.3)
Chloride: 102 mmol/L (ref 98–111)
Creatinine, Ser: 1.04 mg/dL (ref 0.61–1.24)
GFR calc Af Amer: >60 mL/min (ref >60)
GFR calc non Af Amer: >60 mL/min (ref >60)
Glucose, Bld: 114 mg/dL — ABNORMAL HIGH (ref 70–99)
Potassium: 3.9 mmol/L (ref 3.5–5.1)
Sodium: 141 mmol/L (ref 135–145)

## 2018-03-12 LAB — MAGNESIUM: Magnesium: 2.1 mg/dL (ref 1.7–2.4)

## 2018-03-12 MED ORDER — SODIUM CHLORIDE 0.9 % IV SOLN
2.0000 g | Freq: Three times a day (TID) | INTRAVENOUS | Status: DC
  Administered 2018-03-12 – 2018-03-14 (×7): 2 g via INTRAVENOUS
  Filled 2018-03-12 (×9): qty 2

## 2018-03-12 MED ORDER — TAMSULOSIN HCL 0.4 MG PO CAPS
0.4000 mg | ORAL_CAPSULE | Freq: Every day | ORAL | Status: DC
  Administered 2018-03-13 – 2018-03-14 (×2): 0.4 mg via ORAL
  Filled 2018-03-12 (×2): qty 1

## 2018-03-12 MED ORDER — VANCOMYCIN HCL 10 G IV SOLR
2000.0000 mg | Freq: Once | INTRAVENOUS | Status: AC
  Administered 2018-03-12: 2000 mg via INTRAVENOUS
  Filled 2018-03-12: qty 2000

## 2018-03-12 MED ORDER — VANCOMYCIN HCL IN DEXTROSE 1-5 GM/200ML-% IV SOLN
1000.0000 mg | Freq: Three times a day (TID) | INTRAVENOUS | Status: DC
  Administered 2018-03-12 – 2018-03-14 (×6): 1000 mg via INTRAVENOUS
  Filled 2018-03-12 (×7): qty 200

## 2018-03-12 MED ORDER — POTASSIUM CHLORIDE CRYS ER 20 MEQ PO TBCR
20.0000 meq | EXTENDED_RELEASE_TABLET | Freq: Once | ORAL | Status: AC
  Administered 2018-03-12: 20 meq via ORAL
  Filled 2018-03-12: qty 1

## 2018-03-12 MED ORDER — MUPIROCIN 2 % EX OINT
1.0000 "application " | TOPICAL_OINTMENT | Freq: Two times a day (BID) | CUTANEOUS | Status: DC
  Administered 2018-03-12 – 2018-03-14 (×6): 1 via NASAL
  Filled 2018-03-12 (×2): qty 22

## 2018-03-12 MED ORDER — LACTULOSE 10 GM/15ML PO SOLN
10.0000 g | Freq: Two times a day (BID) | ORAL | Status: DC | PRN
  Administered 2018-03-12 – 2018-03-13 (×2): 10 g via ORAL
  Filled 2018-03-12 (×2): qty 15

## 2018-03-12 MED ORDER — CHLORHEXIDINE GLUCONATE CLOTH 2 % EX PADS
6.0000 | MEDICATED_PAD | Freq: Every day | CUTANEOUS | Status: DC
  Administered 2018-03-12 – 2018-03-14 (×3): 6 via TOPICAL

## 2018-03-12 NOTE — Progress Notes (Addendum)
Pharmacy Antibiotic Note  [name] is a 57 y.o. male PMH significant for ischemic cardiomyopathy, systolic CHF, HTN, OSA on bipap, chronic hypoxic respiratory failure, chronic pain and generalized anxiety.  Patient is currently admitted on 03/11/2018 with pneumonia.  Pharmacy has been consulted for vancomycin and cefepime dosing.  Plan: Vancomycin 2083m IV x1 Vancomycin 10086mIV every 8 hours.  Goal trough 15-20 mcg/mL. Cefepime 200014mV every 8 hours. F/u clinical improvement, signs / symptoms of infection, WBC, vancomycin troughs as needed.  Weight: 204 lb (92.5 kg)  Temp (24hrs), Avg:98.3 F (36.8 C), Min:97.8 F (36.6 C), Max:98.8 F (37.1 C)  Recent Labs  Lab 03/11/18 0944 03/12/18 0442  WBC 9.5  --   CREATININE 1.15 1.04    Estimated Creatinine Clearance: 90 mL/min (by C-G formula based on SCr of 1.04 mg/dL).    Allergies  Allergen Reactions  . Darvocet [Propoxyphene N-Acetaminophen] Anaphylaxis    Throat closes  . Propoxyphene Anaphylaxis and Swelling    Antimicrobials this admission: 8/31 Vancomycin >>  8/31 Cefepime >>   Dose adjustments this admission: N/a  Microbiology results: 8/30 Sputum: Sent  8/30 MRSA PCR: Positive  Thank you for allowing pharmacy to be a part of this patient's care.  JosTamela GammonharmD 03/12/2018 9:04 AM PGY-1 Pharmacy Resident Please check [email or web address] for unit-specific pharmacist phone numbers

## 2018-03-12 NOTE — Progress Notes (Addendum)
   Subjective: Patient was doing okay, no acute events overnight. He reports a mild improvement in his shortness of breath, was able to walk to the bathroom with no issues. He reports that he has not been coughing recently, reported an occasional cough in past couple weeks, but has been feeling congested and feels like he is trying to cough something up. He does report some left shoulder pain today, states that it was worse when it's touched. He denied any fevers, chills, nausea or vomiting last night. We discussed the plan with him and he is in agreement.   Objective:  Vital signs in last 24 hours: Vitals:   03/11/18 2112 03/12/18 0013 03/12/18 0026 03/12/18 0448  BP: 134/88 (!) 152/95  (!) 151/79  Pulse: (!) 53 (!) 58 61 (!) 55  Resp:  (!) 22 20 (!) 25  Temp: 98.3 F (36.8 C) 98.5 F (36.9 C)  97.8 F (36.6 C)  TempSrc: Oral Oral  Oral  SpO2: 96% 99% 94% 95%  Weight:    92.5 kg    General: Alert, cooperative, no acute distress Cardiac: RRR, normal S1, S2, no murmurs, rubs or gallops  Pulmonary: Decreased breath sounds in right lower lobe, bilateral rales in lower lungs, non-labored breathing Abdomen: Soft, non-tender, +bowel sounds Extremity: No LE edema, no muscle atrophy, no lesions or wounds noted  Psychiatry: Normal mood and affect     Assessment/Plan: This is a 57 year old male with a PMH of ischemic cardiomyopathy, systolic CHF (EF 32%), HTN, OSA on bipap with 5L, chronic hypoxic respiratory failure on 5LNC, chronic pain and GAD who presented with acute respiratory failure.   Active Problems:   COMBINED HEART FAILURE, CHRONIC   Hypoxia   Acute respiratory failure with hypoxia (HCC)   Acute on chronic congestive heart failure (HCC)  Hypoxic respiratory failure: -Did have some pulmonary edema on initial presentation, had a moderate right Wells score. Patient did report a history of hemoptysis, so there was a concern for PE or malignancy.  -Patient was given 60 mg IV  lasix, had an output of 1,600 while here, patient reported felling  -MRSA was positive -CTA showed no evidence of PE, consolidation and extensive centrilobular nodularity in the basilar right lower lung, favored to be bronchopneumonia, centrilobular nodularity in the medical right middle lobe and anterior inferior right upper lobe which may represent infection. Mild interstitial edema dnad alveolar pulmonary edema, small bilateral pleural effusions.  -Patient presented with worsening dyspnea, reports an occasional cough and feelings of congestion. He has been afebrile. Difficult to tell if this is pnuemonia, patient does not have classic clinical findings however does report an occasional cough, shortness of breath, and findings on CT scan. Will start empiric treatment for now. -Doing echocardiogram to assess for cardiac cause of the pulmonary edema -Respiratory panel, place on droplet precautions, expectorated sputum assessment -Start vancomycin and cefepime per pharmacy consult -Continue BiPAP at night  CAD ICM, EF 30%, s/p ICD -Patient denied any chest pain today -Getting an echocardiogram -Continue ASA and plavix daily -Coreg 12.5 BID  Chronic pain -Continue methadone 200 mcg daily  GAD Panic disorder: Continue diazepam and xanax  OSA: Continue home BiPAP  FEN: No fluids, replete lytes prn, heart healthy diet  VTE ppx: Lovenox  Code Status: FULL   Dispo: Anticipated discharge in approximately 2-3 day(s).   Asencion Noble, MD 03/12/2018, 6:17 AM Pager: [phone]

## 2018-03-12 NOTE — Plan of Care (Signed)
  Problem: Potential for Falls Goal: Patient will remain free of falls Description Zero falls during admission.  Outcome: Progressing   Problem: Education: Goal: Knowledge of General Education information will improve Description Including pain rating scale, medication(s)/side effects and non-pharmacologic comfort measures Outcome: Progressing   Problem: Clinical Measurements: Goal: Ability to maintain clinical measurements within normal limits will improve Outcome: Progressing Goal: Will remain free from infection Outcome: Progressing Goal: Diagnostic test results will improve Outcome: Progressing Goal: Respiratory complications will improve Outcome: Progressing Goal: Cardiovascular complication will be avoided Outcome: Progressing   Problem: Activity: Goal: Risk for activity intolerance will decrease Outcome: Progressing   Problem: Nutrition: Goal: Adequate nutrition will be maintained Outcome: Progressing   Problem: Coping: Goal: Level of anxiety will decrease Outcome: Progressing   Problem: Elimination: Goal: Will not experience complications related to bowel motility Outcome: Progressing Goal: Will not experience complications related to urinary retention Outcome: Progressing   Problem: Pain Managment: Goal: General experience of comfort will improve Outcome: Progressing   Problem: Safety: Goal: Ability to remain free from injury will improve Outcome: Progressing   Problem: Skin Integrity: Goal: Risk for impaired skin integrity will decrease Outcome: Progressing

## 2018-03-12 NOTE — Progress Notes (Signed)
*  PRELIMINARY RESULTS* Echocardiogram 2D Echocardiogram has been performed.  Samuel Germany 03/12/2018, 4:12 PM

## 2018-03-12 NOTE — Progress Notes (Signed)
Patient placed on CPAP 5L bled in at this time, tolerating well, no distress noted.

## 2018-03-13 DIAGNOSIS — I5023 Acute on chronic systolic (congestive) heart failure: Secondary | ICD-10-CM

## 2018-03-13 DIAGNOSIS — J9601 Acute respiratory failure with hypoxia: Secondary | ICD-10-CM

## 2018-03-13 LAB — BASIC METABOLIC PANEL
Anion gap: 7 (ref 5–15)
BUN: 17 mg/dL (ref 6–20)
CO2: 27 mmol/L (ref 22–32)
Calcium: 8.7 mg/dL — ABNORMAL LOW (ref 8.9–10.3)
Chloride: 104 mmol/L (ref 98–111)
Creatinine, Ser: 0.87 mg/dL (ref 0.61–1.24)
GFR calc Af Amer: >60 mL/min (ref >60)
GFR calc non Af Amer: >60 mL/min (ref >60)
Glucose, Bld: 118 mg/dL — ABNORMAL HIGH (ref 70–99)
Potassium: 3.8 mmol/L (ref 3.5–5.1)
Sodium: 138 mmol/L (ref 135–145)

## 2018-03-13 LAB — MAGNESIUM: Magnesium: 2.1 mg/dL (ref 1.7–2.4)

## 2018-03-13 NOTE — Progress Notes (Signed)
   Subjective: [name] was seen and evaluated at bedside on morning rounds. He is feeling better today with continued improvement in shortness of breath. Endorses several episodes of hemoptysis last week, but has not had any further episodes since admission. We discussed if he continues to improve on antibiotic regimen, we will likely be able to transition him to oral antibiotics tomorrow and work towards getting him home in the next day or two.   Objective:  Vital signs in last 24 hours: Vitals:   03/13/18 0030 03/13/18 0559 03/13/18 0752 03/13/18 1203  BP: (!) 148/91  (!) 146/88 (!) 147/96  Pulse: (!) 58  60 68  Resp: 20  18 18   Temp: 98 F (36.7 C)  98.5 F (36.9 C) 97.8 F (36.6 C)  TempSrc: Oral  Oral Oral  SpO2: 99%  97% 98%  Weight:  91.5 kg    Height:       General: awake, alert, sitting up in bed in NAD CV: RRR; no murmurs, rubs or gallops Resp: No increased work of breathing; good air movement throughout; no rales, rhonchi or wheezes  Ext: no lower extremity edema Psych: appropriate mood and affect    Assessment/Plan:  Active Problems:   COMBINED HEART FAILURE, CHRONIC   Hypoxia   Acute respiratory failure with hypoxia (HCC)   Acute on chronic congestive heart failure (HCC)  1. Acute hypoxic respiratory failure - improved; saturating well on room air - CT findings consistent with bronchopneumonia which could explain the several episodes of hemoptysis he had last week prior to admission. He denies any further hemoptysis; if this were to develop again despite antibiotic therapy, would need a bronchoscopy for further evaluation. He seems to be improving clinically on current broad spectrum abx. We will re-assess him tomorrow morning, and if he continues to improve can switch to PO Cipro with possible discharge tomorrow. - plan for follow-up CT scan in 6 weeks  2. CAD, ICM, HFrEF s/p ICD - echo yesterday showed EF of 30-35% - continue ASA, plavix, Coreg   3.  Chronic pain - continue methadone 200 mcg daily  4. GAD, Panic disorder - continue diazepam and alprazolam   5. OSA - continue home CPAP   Dispo: Anticipated discharge in approximately 1-2 day(s).   Modena Nunnery D, DO 03/13/2018, 12:59 PM PGY-1

## 2018-03-14 LAB — BASIC METABOLIC PANEL
Anion gap: 7 (ref 5–15)
BUN: 16 mg/dL (ref 6–20)
CO2: 28 mmol/L (ref 22–32)
Calcium: 8.7 mg/dL — ABNORMAL LOW (ref 8.9–10.3)
Chloride: 104 mmol/L (ref 98–111)
Creatinine, Ser: 0.99 mg/dL (ref 0.61–1.24)
GFR calc Af Amer: >60 mL/min (ref >60)
GFR calc non Af Amer: >60 mL/min (ref >60)
Glucose, Bld: 102 mg/dL — ABNORMAL HIGH (ref 70–99)
Potassium: 4 mmol/L (ref 3.5–5.1)
Sodium: 139 mmol/L (ref 135–145)

## 2018-03-14 MED ORDER — AZITHROMYCIN 250 MG PO TABS: ORAL_TABLET | ORAL | 0 refills | Status: AC

## 2018-03-14 MED ORDER — ALBUTEROL SULFATE HFA 108 (90 BASE) MCG/ACT IN AERS: 2.0000 | INHALATION_SPRAY | Freq: Four times a day (QID) | RESPIRATORY_TRACT | 0 refills | Status: DC | PRN

## 2018-03-14 MED ORDER — ASPIRIN EC 81 MG PO TBEC: 81.0000 mg | DELAYED_RELEASE_TABLET | Freq: Every day | ORAL | 2 refills | Status: DC

## 2018-03-14 MED ORDER — SODIUM CHLORIDE 0.9 % IV SOLN
INTRAVENOUS | Status: DC | PRN
  Administered 2018-03-14: 250 mL via INTRAVENOUS

## 2018-03-14 MED ORDER — CIPROFLOXACIN HCL 750 MG PO TABS: 750.0000 mg | ORAL_TABLET | Freq: Two times a day (BID) | ORAL | 0 refills | Status: DC

## 2018-03-14 MED ORDER — SUCRALFATE 1 G PO TABS: 1.0000 g | ORAL_TABLET | Freq: Three times a day (TID) | ORAL | 0 refills | Status: DC

## 2018-03-14 NOTE — Progress Notes (Signed)
Patient complained of bloody sputum x 1. Dr. Glenna Fellows notified.

## 2018-03-14 NOTE — Discharge Summary (Signed)
Name: [name] MRN: [id number] DOB: 01/26/1961 57 y.o. PCP: System, Pcp Not In  Date of Admission: 03/11/2018  9:31 AM Date of Discharge:  Attending Physician: No att. providers found  Discharge Diagnosis: 1. Acute on chronic hypoxic respiratory failure  2. RLL pneumonia   Discharge Medications: Allergies as of 03/14/2018      Reactions   Darvocet [propoxyphene N-acetaminophen] Anaphylaxis   Throat closes   Propoxyphene Anaphylaxis, Swelling      Medication List    STOP taking these medications   aspirin 325 MG tablet   morphine 30 MG tablet Commonly known as:  MSIR     TAKE these medications   albuterol 108 (90 Base) MCG/ACT inhaler Commonly known as:  PROVENTIL HFA;VENTOLIN HFA Inhale 2 puffs into the lungs every 6 (six) hours as needed for wheezing or shortness of breath.   alfuzosin 10 MG 24 hr tablet Commonly known as:  UROXATRAL Take 10 mg by mouth as needed. For UTI   ALPRAZolam 1 MG tablet Commonly known as:  XANAX Take 1 mg by mouth 4 (four) times daily.   azithromycin 250 MG tablet Commonly known as:  ZITHROMAX Take 2 tablets (500 mg) on  Day 1,  followed by 1 tablet (250 mg) once daily on Days 2 through 5.   carvedilol 25 MG tablet Commonly known as:  COREG TAKE 1 TABLET TWICE DAILY WITH A MEAL. What changed:  See the new instructions.   ciprofloxacin 750 MG tablet Commonly known as:  CIPRO Take 1 tablet (750 mg total) by mouth 2 (two) times daily.   clopidogrel 75 MG tablet Commonly known as:  PLAVIX TAKE 1 TABLET EACH DAY. What changed:  See the new instructions.   cyanocobalamin 1000 MCG/ML injection Commonly known as:  (VITAMIN B-12) Inject 1 mL (1,000 mcg total) into the muscle once a week. For 4 weeks and then once per month   diazepam 10 MG tablet Commonly known as:  VALIUM Take 10 mg by mouth at bedtime as needed for anxiety.   ezetimibe 10 MG tablet Commonly known as:  ZETIA TAKE 1 TABLET EACH DAY. What changed:  See the  new instructions.   gabapentin 300 MG capsule Commonly known as:  NEURONTIN Take 3 capsules (900 mg total) by mouth 3 (three) times daily.   lactulose 10 GM/15ML solution Commonly known as:  CHRONULAC Take 15 mLs (10 g total) by mouth 2 (two) times daily as needed for mild constipation.   methadone 40 MG disintegrating tablet Commonly known as:  METHADOSE Take 200 mg by mouth daily.   nitroGLYCERIN 0.4 MG/SPRAY spray Commonly known as:  [place] under the tongue.   ondansetron 8 MG disintegrating tablet Commonly known as:  ZOFRAN-ODT Take 1 tablet (8 mg total) by mouth every 8 (eight) hours as needed for nausea or vomiting.   polyethylene glycol powder powder Commonly known as:  GLYCOLAX/MIRALAX MIX 1 CAPFUL TWICE DAILY AS NEEDED.   ranitidine 150 MG tablet Commonly known as:  ZANTAC Take 300 mg by mouth at bedtime.   rosuvastatin 40 MG tablet Commonly known as:  CRESTOR TAKE 1 TABLET DAILY. What changed:  when to take this   spironolactone 25 MG tablet Commonly known as:  ALDACTONE TAKE (1/2) TABLET DAILY. What changed:  See the new instructions.   sucralfate 1 g tablet Commonly known as:  CARAFATE Take 1 tablet (1 g total) by mouth 4 (four) times daily -  with meals and at bedtime.  tamsulosin 0.4 MG Caps capsule Commonly known as:  FLOMAX Take 1 capsule (0.4 mg total) by mouth daily after breakfast.   torsemide 20 MG tablet Commonly known as:  DEMADEX Take 1 tablet (20 mg total) by mouth daily as needed. For swelling in legs   traZODone 100 MG tablet Commonly known as:  DESYREL Take 50 mg by mouth at bedtime as needed for sleep.   triamcinolone 55 MCG/ACT Aero nasal inhaler Commonly known as:  NASACORT Place 1 spray into the nose daily.   VIAGRA 100 MG tablet Generic drug:  sildenafil TAKE 1 TABLET APPROXIMATELY 1 HOUR BEFORE NEEDING AS DIRECTED. What changed:  See the new instructions.       Disposition and follow-up:   [name] was discharged from [facility] in Stable condition.  At the hospital follow up visit please address:  1.  Acute on chronic hypoxic respiratory failure secondary to likely RLL pneumonia. Patient was discharged on 1 week course of Ciprofloxacin and Azithromycin (to be completed 9/10). Please ensure he has continued to improve. If he continues to have hemoptysis despite finishing antibiotics, he should have further work-up done with bronchoscopy to investigate source of bleeding. Please schedule him for a follow-up CT scan in 6 weeks to ensure resolution of infiltrates.   2.  Labs / imaging needed at time of follow-up: Repeat CT in 6 weeks   3.  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  1. Hypoxic respiratory failure: Patient presented with progressive shortness of breath for the past couple of weeks with sudden worsening the morning of presentation. He also had 3-4 days of hemoptysis the week prior which resolved spontaneously. Given his cardiac history, there was cConcern for worsening heart failure. Echo was unchanged from previous in 2013 (EF 30-35%). With remote history of hemoptysis, CTA was done due to concern for malignancy versus PE. CT angiogram was negative for PE, and did not show any findings consistent with malignancy. It did, however, show concern for bronchopneumonia in right lower lobe.   2. RLL pneumonia: Patient was started on broad spectrum IV vanc and cefepime. He clinically improved over the next 2 days and was discharged on 1 week course of Cipro and Azithromycin. Patient was told that if hemoptysis persisted after course of  antibiotics was completed, he would likely need bronchoscopy to investigate the source of the bleeding. Otherwise, he should have a follow-up CT scan done in 6 weeks to assure resolution of the infiltrates.    Discharge Vitals:   BP (!) 149/94 (BP Location: Right Arm)   Pulse (!) 59   Temp 98.3 F (36.8 C) (Oral)   Resp 18   Ht 5\' 11"  (1.803 m)   Wt 90.8 kg   SpO2 98%   BMI 27.91 kg/m   Pertinent Labs, Studies, and Procedures:  CT angiogram IMPRESSION: 1. Consolidation and extensive centrilobular nodularity within the basilar right lower lobe, favored to reflect bronchopneumonia. Additional more focal centrilobular nodularity in medial right middle lobe and anterior inferior right upper lobe also likely represent additional areas of infection. Followup PA and lateral chest X-ray is recommended in 3-4 weeks following trial of  antibiotic therapy to ensure resolution. 2. Mild interstitial and alveolar pulmonary edema. Small bilateral pleural effusions. 3. No evidence of pulmonary embolism. Reflux of contrast into the hepatic veins, suggestive of right heart dysfunction.  Discharge Instructions: Discharge Instructions    Call MD for:  persistant dizziness or light-headedness   Complete by:  As directed    Call MD for:  persistant nausea and vomiting   Complete by:  As directed    Call MD for:  redness, tenderness, or signs of infection (pain, swelling, redness, odor or green/yellow discharge around incision site)   Complete by:  As directed    Call MD for:  temperature >100.4   Complete by:  As directed    Diet - low sodium heart healthy   Complete by:  As directed    Increase activity slowly   Complete by:  As directed       Signed: Delice Bison, DO 03/16/2018, 6:20 PM   Pager: [phone]

## 2018-03-14 NOTE — Progress Notes (Addendum)
Patient called unit stating he is coughing up blood while ambulating.  Paged Dr Lew Dawes and patient's number provided for her to call patient.

## 2018-03-14 NOTE — Progress Notes (Signed)
   Subjective: [name] was seen and evaluated at bedside this morning. He reports continued improvement in symptoms and is saturating well on room air. He feels back to his baseline and is ready to go home on oral antibiotics.   He informed his nurse later today that he had an episode of bloody sputum. Nurse relayed to him that this is likely in the setting of pneumonia, and if this persists after completing antibiotics will need further evaluation.    Objective:  Vital signs in last 24 hours: Vitals:   03/14/18 0536 03/14/18 0544 03/14/18 0800 03/14/18 1135  BP: (!) 137/98  (!) 152/91 (!) 149/94  Pulse: 68  (!) 59 (!) 59  Resp:      Temp:  97.7 F (36.5 C) 98.3 F (36.8 C) 98.3 F (36.8 C)  TempSrc:  Oral Oral Oral  SpO2: 100%  98%   Weight: 90.8 kg     Height:       General: awake, alert, sitting up in bed in NAD CV: RRR; no murmurs, rubs or gallops Resp: no increased work of breathing; good air movement; lungs CTA without rales, rhonchi or wheezes Ext: no lower extremity edema Psych: appropriate mood and affect   Assessment/Plan:  Active Problems:   COMBINED HEART FAILURE, CHRONIC   Hypoxia   Acute respiratory failure with hypoxia (HCC)   Acute on chronic congestive heart failure ([place])  1. Acute hypoxic respiratory failure - resolved; saturating well on room air - CT findings consistent with bronchopneumonia which could explain the several episodes of hemoptysis he had last week prior to admission. He had an episode of bloody sputum today which can be expected in setting of likely pneumonia; if this were to develop again despite completion of antibiotic therapy, would need a bronchoscopy for further evaluation. Given clinical improvement on antibiotics, will discharge him today on azithromycin and Cipro for 1 week.  - plan for follow-up CT scan in 6 weeks  2. CAD, ICM, HFrEF s/p ICD - echo 8/31 showed EF of 30-35% - continue ASA, plavix, Coreg   3. Chronic  pain - continue methadone 200 mcg daily  4. GAD, Panic disorder - continue diazepam and alprazolam   5. OSA - continue home CPAP   Dispo: Anticipated discharge tomorrow.  Modena Nunnery D, DO 03/14/2018, 1:40 PM Pager: [phone]

## 2018-03-14 NOTE — Progress Notes (Signed)
Discharge order received.  Discharge instructions reviewed with and given to patient.  Patient states he is out of albuterol and carafate.  Dr Koleen Distance to escript to listed pharmacy.

## 2018-03-14 NOTE — Discharge Instructions (Signed)
Community-Acquired Pneumonia, Adult Pneumonia is an infection of the lungs. One type of pneumonia can happen while a person is in a hospital. A different type can happen when a person is not in a hospital (community-acquired pneumonia). It is easy for this kind to spread from person to person. It can spread to you if you breathe near an infected person who coughs or sneezes. Some symptoms include:  A dry cough.  A wet (productive) cough.  Fever.  Sweating.  Chest pain.  Follow these instructions at home:  Take over-the-counter and prescription medicines only as told by your doctor. ? Only take cough medicine if you are losing sleep. ? If you were prescribed an antibiotic medicine, take it as told by your doctor. Do not stop taking the antibiotic even if you start to feel better.  Sleep with your head and neck raised (elevated). You can do this by putting a few pillows under your head, or you can sleep in a recliner.  Do not use tobacco products. These include cigarettes, chewing tobacco, and e-cigarettes. If you need help quitting, ask your doctor.  Drink enough water to keep your pee (urine) clear or pale yellow. A shot (vaccine) can help prevent pneumonia. Shots are often suggested for:  People older than 58 years of age.  People older than 58 years of age: ? Who are having cancer treatment. ? Who have long-term (chronic) lung disease. ? Who have problems with their body's defense system (immune system).  You may also prevent pneumonia if you take these actions:  Get the flu (influenza) shot every year.  Go to the dentist as often as told.  Wash your hands often. If soap and water are not available, use hand sanitizer.  Contact a doctor if:  You have a fever.  You lose sleep because your cough medicine does not help. Get help right away if:  You are short of breath and it gets worse.  You have more chest pain.  Your sickness gets worse. This is very serious  if: ? You are an older adult. ? Your body's defense system is weak.  You cough up blood. This information is not intended to replace advice given to you by your health care provider. Make sure you discuss any questions you have with your health care provider. Document Released: 12/16/2007 Document Revised: 12/05/2015 Document Reviewed: 10/24/2014 Elsevier Interactive Patient Education  [employer].     If you begin to cough up blood again and it persists please notify your primary care doctor. If this occurs after you complete your antibiotics or you develop shortness of breath again please contact your primary doctor immediatly and or return to the ED.   Other concerning symptoms that should be relayed to your PCP are fever, chills, night sweats, weight loss, or severe chest pain.   Please take your ciprofloxacin for 7 days. This is to be taken every 12 hours until the course is completed.   In addition you will need a repeat chest CT in 6 weeks to evaluate your chest for resolution of the infiltrate.

## 2018-03-15 ENCOUNTER — Ambulatory Visit: Payer: BLUE CROSS/BLUE SHIELD | Admitting: Pulmonary Disease

## 2018-03-15 ENCOUNTER — Ambulatory Visit: Payer: Self-pay | Admitting: Neurology

## 2018-03-15 ENCOUNTER — Encounter: Payer: Self-pay | Admitting: Pulmonary Disease

## 2018-03-15 ENCOUNTER — Other Ambulatory Visit: Payer: Self-pay | Admitting: *Deleted

## 2018-03-15 VITALS — BP 110/78 | HR 67 | Temp 97.5°F | Ht 70.0 in | Wt 202.0 lb

## 2018-03-15 DIAGNOSIS — J181 Lobar pneumonia, unspecified organism: Secondary | ICD-10-CM

## 2018-03-15 DIAGNOSIS — F17200 Nicotine dependence, unspecified, uncomplicated: Secondary | ICD-10-CM | POA: Insufficient documentation

## 2018-03-15 DIAGNOSIS — R05 Cough: Secondary | ICD-10-CM | POA: Diagnosis not present

## 2018-03-15 DIAGNOSIS — J9611 Chronic respiratory failure with hypoxia: Secondary | ICD-10-CM

## 2018-03-15 DIAGNOSIS — J189 Pneumonia, unspecified organism: Secondary | ICD-10-CM | POA: Insufficient documentation

## 2018-03-15 DIAGNOSIS — F1290 Cannabis use, unspecified, uncomplicated: Secondary | ICD-10-CM | POA: Insufficient documentation

## 2018-03-15 DIAGNOSIS — G5632 Lesion of radial nerve, left upper limb: Secondary | ICD-10-CM

## 2018-03-15 DIAGNOSIS — F17210 Nicotine dependence, cigarettes, uncomplicated: Secondary | ICD-10-CM | POA: Diagnosis not present

## 2018-03-15 DIAGNOSIS — R042 Hemoptysis: Secondary | ICD-10-CM

## 2018-03-15 DIAGNOSIS — M5416 Radiculopathy, lumbar region: Secondary | ICD-10-CM

## 2018-03-15 DIAGNOSIS — R059 Cough, unspecified: Secondary | ICD-10-CM | POA: Insufficient documentation

## 2018-03-15 DIAGNOSIS — G4731 Primary central sleep apnea: Secondary | ICD-10-CM

## 2018-03-15 MED ORDER — FAMOTIDINE 20 MG PO TABS: 20.0000 mg | ORAL_TABLET | Freq: Two times a day (BID) | ORAL | 0 refills | Status: DC

## 2018-03-15 NOTE — Assessment & Plan Note (Addendum)
Proceed forward with Z-Pak as well as ciprofloxacin as prescribed by hospitalist at discharge  Will need chest x-ray in 3 to 4 weeks  Follow-up with our office in 1 to 2 weeks  Continue probiotic daily  We will change Protonix >>> Stop Protonix >>>Continue Zantac 334m daily  Stop lisinopril  Stop daily asa today  >>> Contact cardiology regarding her Plavix dose  Can use Mucinex DM to help with mucus and congestion

## 2018-03-15 NOTE — Progress Notes (Signed)
@Patient  ID: [name], male    DOB: Nov 15, 1960, 57 y.o.   MRN: [id number]  Chief Complaint  Patient presents with  . Acute Visit    SOB, HFU - CHF exacerbatio    Referring provider: No ref. provider found  HPI:  57 year old male current every day smoker followed in our office for obstructive sleep apnea, oxygen dependent chronic respiratory failure (5 L of oxygen via BiPAP at night)  PMH:  Polycythemia, CHF (EF 20-25 percent), ICD  Smoker/ Smoking History: Current Smoker. 14.5 pack years. Pt also occasionally vaping prior to inpt stay.  Maintenance: none    Pt of: Dr. Halford Chessman / Dr. Arlyss Gandy  Recent [facility] Encounters:   12/2016 - last appt with Dr. Melvyn Novas  07/19/2017-OV-Sood Patient presents today requesting new BiPAP.  He currently uses 5 L of oxygen via BiPAP.  Patient continues to smoke cigarettes.  Patient reports he is trying to cut down on pain medications.  Patient reports being separated from his wife recently and now has custody of his son. Plan: We will arrange Joselyn Arrow with BiPAP, emphasized smoking cessation, continue 5 L of oxygen with BiPAP at night, keep follow-up with hematology to further assess polycythemia,Follow-up in 6 months   03/15/2018  - Visit   57 year old patient presenting today for acute symptoms.  Patient reports he has had 2 episodes of hemoptysis since being discharged from the hospital yesterday.  Patient also reporting that he had multiple episodes of hemoptysis prior to hospitalization.  Patient was diagnosed with pneumonia in the hospital patient has not started oral antibiotics yet.  Patient was treated with IV vancomycin during his inpatient stay.  Patient reports adherence to his BiPAP as well as using 5 L of oxygen at night.  Patient also reporting that having difficulties with oxygen coverage from his insurance company.  Patient is now reporting this is an out-of-pocket cost.  It appears per January/2019 documentation that we wanted to proceed  forward with an hour now.  It does not appear that this is been completed.  Patient does not think that he has pneumonia.  Patient also has concerns that he has a pulmonary embolism.  CT angios in the hospital was negative.  Patient is concerned that he may need a bronchoscopy.  Patient has been continuing follow-up with hematology for his pancytopenia  Of note patient reports that he is recently stopped smoking.  Currently wearing a nicotine patch.  Prior to hospitalization patient was not smoking but was vaping nicotine as well as THC.  Patient reports that he did not use marijuana regularly but it was occasional.  That he would "test negative if tested today".  Patient reports he has not vape since prior to hospitalization.     Tests:   Sleep tests PSG 10/14/12 >> AHI 63  Pulmonary tests PFT 11/08/13 >> FEV1 4.23 (103%), FEV1% 79, TLC 6.67 (90%), DLCO 102% CT chest 11/05/15 >> atherosclerosis, air-fluid level in esophagus 03/11/2018-CT Angio- consolidation extensive centrilobular nodularity within the basilar right lower lobe favored to reflect bronchopneumonia, repeat chest x-ray in 3 to 4 weeks, no evidence of PE  Cardiac tests Echo 05/24/12 > EF 30 to 35% 03/12/18-echocardiogram-LV ejection fraction 30 to 35%   Chart Review:  03/11/2018- hospitalization-acute on chronic congestive heart failure >>>Discharge date 03/14/2018    Specialty Problems      Pulmonary Problems   Complex sleep apnea syndrome    NPSG 10/2012:  AHI 63/hr, with centrals >> obstructions. Titration 01/2013:  Failed  cpap, bipap.  Started ASV with epap 6cm, PS 3-15cm.  Large resmed airfit F10 FFM.  ONO 03/2014:  desat to 82%, 267mn less than 88%, however unknown whether pt wore ASV device during the ONO study.       Hemoptysis    Onset 12/25/16  - rx doxy thru 6/120 then levaquin 01/01/17 x 7 d @ 500 mg /day > resolved 01/08/2017       COPD  GOLD 0    PFTS [facility]  11/08/13 wnl including dlco except for mild  curvature to f/v loop c/w small airways dz      Chronic respiratory failure with hypoxia (HCC)   Treatment-emergent central sleep apnea   Acute respiratory failure with hypoxia (HCC)   Hypoxia   Cough   Right lower lobe pneumonia (HCC)      Allergies  Allergen Reactions  . Darvocet [Propoxyphene N-Acetaminophen] Anaphylaxis    Throat closes  . Propoxyphene Anaphylaxis and Swelling    Immunization History  Administered Date(s) Administered  . Influenza Split 04/12/2013, 09/11/2015  . Influenza Whole 05/25/2012  . Influenza,inj,Quad PF,6+ Mos 06/11/2014, 09/25/2015, 05/11/2016, 07/01/2016, 06/14/2017  . Influenza-Unspecified 05/25/2012, 04/20/2013, 03/14/2017  . Pneumococcal Conjugate-13 06/11/2014  . Pneumococcal Polysaccharide-23 07/13/2013, 08/10/2013  . Td 08/10/2013  Needs flu vaccine  Past Medical History:  Diagnosis Date  . Anxiety   . Automatic implantable cardiac defibrillator [employer]    Analyze ST  . Benign prostatic hypertrophy   . Benzodiazepine dependence (HCC)    chronic  . CAD (coronary artery disease)    Last cath 2/12. 3-v CAD. Failed PCI of distal RCAc  CATH [facility] 4/13 with DES to  LAD X2  . CHF (congestive heart failure) ([facility]   . Chronic back pain    lumbar stenosis  . Chronic systolic heart failure (HCC)    EF 20-25%. s/p ST. Jude ICD  . Depression   . DJD (degenerative joint disease)   . History of testicular cancer   . Narcotic dependence (HCC)    chronic  . Vitamin B12 deficiency 07/22/2016    Tobacco History: Social History   Tobacco Use  Smoking Status Current Every Day Smoker  . Packs/day: 0.50  . Years: 29.00  . Pack years: 14.50  . Types: Cigarettes, E-cigarettes  . Last attempt to quit: 07/14/2015  . Years since quitting: 2.6  Smokeless Tobacco Never Used  Tobacco Comment   no cigarette in 4 days but yes to e-cig   Ready to quit: Yes Counseling given: Yes Comment: no cigarette in 4 days but yes to e-cig  Smoking  assessment and cessation counseling  Patient currently smoking: Nothing.  Patient using nicotine patch.  Patient denies e-cigarette or vaping use prior to hospitalization (8/30-4 days ago) I have advised the patient to quit/stop smoking as soon as possible due to high risk for multiple medical problems.  It will also be very difficult for [facility] manage patient's  respiratory symptoms and status if we continue to expose her lungs to a known irritant.  We do not advise e-cigarettes as a form of stopping smoking.  Patient is willing to quit smoking.  I have advised the patient that we can assist and have options of nicotine replacement therapy, provided smoking cessation education today, provided smoking cessation counseling, and provided cessation resources.  Emphasized the importance of the patient that he does not restart vaping.  And discouraged patient from using THC cartridges or smoking marijuana.  Follow-up next office visit office visit  for assessment of smoking cessation.  Smoking cessation counseling advised for: 4 min    Outpatient Encounter Medications as of 03/15/2018  Medication Sig  . albuterol (VENTOLIN HFA) 108 (90 Base) MCG/ACT inhaler Inhale 2 puffs into the lungs every 6 (six) hours as needed for wheezing or shortness of breath.  . alfuzosin (UROXATRAL) 10 MG 24 hr tablet Take 10 mg by mouth as needed. For UTI  . ALPRAZolam (XANAX) 1 MG tablet Take 1 mg by mouth 4 (four) times daily.   Marland Kitchen azithromycin (ZITHROMAX Z-PAK) 250 MG tablet Take 2 tablets (500 mg) on  Day 1,  followed by 1 tablet (250 mg) once daily on Days 2 through 5.  . carvedilol (COREG) 25 MG tablet TAKE 1 TABLET TWICE DAILY WITH A MEAL. (Patient taking differently: Take 25 mg by mouth 2 (two) times daily with a meal. )  . ciprofloxacin (CIPRO) 750 MG tablet Take 1 tablet (750 mg total) by mouth 2 (two) times daily.  . clopidogrel (PLAVIX) 75 MG tablet TAKE 1 TABLET EACH DAY. (Patient taking differently: Take 75 mg  by mouth daily. )  . cyanocobalamin (,VITAMIN B-12,) 1000 MCG/ML injection Inject 1 mL (1,000 mcg total) into the muscle once a week. For 4 weeks and then once per month  . diazepam (VALIUM) 10 MG tablet Take 10 mg by mouth at bedtime as needed for anxiety.   Marland Kitchen ezetimibe (ZETIA) 10 MG tablet TAKE 1 TABLET EACH DAY. (Patient taking differently: Take 10 mg by mouth daily. )  . gabapentin (NEURONTIN) 300 MG capsule Take 3 capsules (900 mg total) by mouth 3 (three) times daily.  Marland Kitchen lactulose (CHRONULAC) 10 GM/15ML solution Take 15 mLs (10 g total) by mouth 2 (two) times daily as needed for mild constipation.  . methadone (METHADOSE) 40 MG disintegrating tablet Take 200 mg by mouth daily.  . nitroGLYCERIN (NITROLINGUAL) 0.4 MG/SPRAY spray Place under the tongue.  . ondansetron (ZOFRAN-ODT) 8 MG disintegrating tablet Take 1 tablet (8 mg total) by mouth every 8 (eight) hours as needed for nausea or vomiting.  . polyethylene glycol powder (GLYCOLAX/MIRALAX) powder MIX 1 CAPFUL TWICE DAILY AS NEEDED.  . ranitidine (ZANTAC) 150 MG tablet Take 300 mg by mouth at bedtime.  . rosuvastatin (CRESTOR) 40 MG tablet TAKE 1 TABLET DAILY. (Patient taking differently: Take 40 mg by mouth every evening. )  . spironolactone (ALDACTONE) 25 MG tablet TAKE (1/2) TABLET DAILY. (Patient taking differently: Take 25 mg by mouth daily. TAKE (1/2) TABLET DAILY.)  . sucralfate (CARAFATE) 1 g tablet Take 1 tablet (1 g total) by mouth 4 (four) times daily -  with meals and at bedtime.  . tamsulosin (FLOMAX) 0.4 MG CAPS capsule Take 1 capsule (0.4 mg total) by mouth daily after breakfast.  . torsemide (DEMADEX) 20 MG tablet Take 1 tablet (20 mg total) by mouth daily as needed. For swelling in legs  . traZODone (DESYREL) 100 MG tablet Take 50 mg by mouth at bedtime as needed for sleep.   Marland Kitchen triamcinolone (NASACORT AQ) 55 MCG/ACT AERO nasal inhaler Place 1 spray into the nose daily.  Marland Kitchen VIAGRA 100 MG tablet TAKE 1 TABLET APPROXIMATELY 1  HOUR BEFORE NEEDING AS DIRECTED. (Patient taking differently: Take 100 mg by mouth as needed for erectile dysfunction. )  . [DISCONTINUED] aspirin EC 81 MG tablet Take 1 tablet (81 mg total) by mouth daily.  . [DISCONTINUED] lisinopril (PRINIVIL,ZESTRIL) 5 MG tablet Take 1 tablet (5 mg total) by mouth 2 (two) times daily.  . [  DISCONTINUED] pantoprazole (PROTONIX) 40 MG tablet Take 1 tablet (40 mg total) by mouth daily.  . [DISCONTINUED] famotidine (PEPCID) 20 MG tablet Take 1 tablet (20 mg total) by mouth 2 (two) times daily.  . [DISCONTINUED] LORazepam (ATIVAN) 1 MG tablet Take 2 mg by mouth 4 (four) times daily as needed for anxiety or sleep. For anxiety   Facility-Administered Encounter Medications as of 03/15/2018  Medication  . cyanocobalamin ((VITAMIN B-12)) injection 1,000 mcg    Review of Systems  Review of Systems  Constitutional: Positive for fatigue. Negative for activity change, chills, fever and unexpected weight change.  HENT: Negative for congestion, postnasal drip and rhinorrhea.   Respiratory: Positive for cough (thick yellow mucous ) and shortness of breath. Negative for wheezing.        Hemoptysis - 2-4x    Cardiovascular: Negative for chest pain and palpitations.  Gastrointestinal: Negative for constipation, diarrhea, nausea and vomiting.  Genitourinary: Negative for hematuria and urgency.  Musculoskeletal: Negative for arthralgias.  Skin: Negative for color change.  Neurological: Positive for dizziness and weakness. Negative for headaches.  Psychiatric/Behavioral: Negative for dysphoric mood. The patient is not nervous/anxious.   All other systems reviewed and are negative.    Physical Exam  BP 110/78 (BP Location: Left Arm, Cuff Size: Normal)   Pulse 67   Temp (!) 97.5 F (36.4 C) (Oral)   Ht 5' 10"  (1.778 m)   Wt 202 lb (91.6 kg)   SpO2 97%   BMI 28.98 kg/m   Wt Readings from Last 5 Encounters:  03/15/18 202 lb (91.6 kg)  03/14/18 200 lb 1.6 oz (90.8  kg)  02/22/18 208 lb (94.3 kg)  02/04/18 213 lb (96.6 kg)  01/12/18 205 lb (93 kg)     Physical Exam  Constitutional: He is oriented to person, place, and time and well-developed, well-nourished, and in no distress. No distress.  HENT:  Head: Normocephalic and atraumatic.  Right Ear: Hearing, tympanic membrane, external ear and ear canal normal.  Left Ear: Hearing, tympanic membrane, external ear and ear canal normal.  Nose: Nose normal. Right sinus exhibits no maxillary sinus tenderness and no frontal sinus tenderness. Left sinus exhibits no maxillary sinus tenderness and no frontal sinus tenderness.  Mouth/Throat: Uvula is midline and oropharynx is clear and moist. No oropharyngeal exudate.  Eyes: Pupils are equal, round, and reactive to light.  Neck: Normal range of motion. Neck supple. No JVD present.  Cardiovascular: Normal rate, regular rhythm and normal heart sounds.  Pulmonary/Chest: Effort normal and breath sounds normal. No accessory muscle usage. No respiratory distress. He has no decreased breath sounds. He has no wheezes. He has no rhonchi.  Abdominal: Soft. Bowel sounds are normal. There is no tenderness.  Musculoskeletal: Normal range of motion. He exhibits no edema.  Left knee brace applied  Lymphadenopathy:    He has no cervical adenopathy.  Neurological: He is alert and oriented to person, place, and time.  Abnormal gait  Skin: Skin is warm and dry. He is not diaphoretic. No erythema.  Psychiatric: Memory, affect and judgment normal. His mood appears anxious.  Nursing note and vitals reviewed.     Lab Results:  CBC    Component Value Date/Time   WBC 9.5 03/11/2018 0944   RBC 5.28 03/11/2018 0944   HGB 17.7 (H) 03/11/2018 0944   HGB 16.1 12/22/2017 1628   HCT 53.6 (H) 03/11/2018 0944   HCT 46.9 12/22/2017 1628   PLT 209 03/11/2018 0944   PLT 165 12/22/2017  1628   MCV 101.5 (H) 03/11/2018 0944   MCV 96 12/22/2017 1628   MCH 33.5 03/11/2018 0944   MCHC  33.0 03/11/2018 0944   RDW 13.4 03/11/2018 0944   RDW 15.5 (H) 12/22/2017 1628   LYMPHSABS 1.7 03/11/2018 0944   LYMPHSABS 1.8 12/22/2017 1628   MONOABS 0.5 03/11/2018 0944   EOSABS 0.4 03/11/2018 0944   EOSABS 0.3 12/22/2017 1628   BASOSABS 0.1 03/11/2018 0944   BASOSABS 0.0 12/22/2017 1628    BMET    Component Value Date/Time   NA 139 03/14/2018 0409   NA 143 12/22/2017 1628   K 4.0 03/14/2018 0409   CL 104 03/14/2018 0409   CO2 28 03/14/2018 0409   GLUCOSE 102 (H) 03/14/2018 0409   BUN 16 03/14/2018 0409   BUN 10 12/22/2017 1628   CREATININE 0.99 03/14/2018 0409   CREATININE 0.97 08/26/2017 1152   CREATININE 0.97 12/04/2014 1644   CALCIUM 8.7 (L) 03/14/2018 0409   GFRNONAA >60 03/14/2018 0409   GFRNONAA >60 08/26/2017 1152   GFRAA >60 03/14/2018 0409   GFRAA >60 08/26/2017 1152    BNP    Component Value Date/Time   BNP 771.5 (H) 03/11/2018 0944    ProBNP    Component Value Date/Time   PROBNP 152.0 (H) 01/08/2017 1444    Imaging: Ct Angio Chest Pe W Or Wo Contrast  Result Date: 03/11/2018 CLINICAL DATA:  Acute hypoxic respiratory failure. EXAM: CT ANGIOGRAPHY CHEST WITH CONTRAST TECHNIQUE: Multidetector CT imaging of the chest was performed using the standard protocol during bolus administration of intravenous contrast. Multiplanar CT image reconstructions and MIPs were obtained to evaluate the vascular anatomy. CONTRAST:  143m ISOVUE-370 IOPAMIDOL (ISOVUE-370) INJECTION 76% COMPARISON:  CT chest dated November 05, 2015. FINDINGS: Cardiovascular: Satisfactory opacification of the pulmonary arteries to the segmental level. No evidence of pulmonary embolism. Borderline cardiomegaly. Mild left atrial enlargement. No pericardial effusion. Normal caliber thoracic aorta. Coronary, aortic arch, and branch vessel atherosclerotic vascular disease. Reflux of contrast into the hepatic veins. Mediastinum/Nodes: Mildly enlarged right hilar lymph node measuring 1.2 cm in short axis,  favored reactive. Subcentimeter mediastinal lymph nodes are also likely reactive. No axillary lymphadenopathy. The thyroid gland, trachea, and esophagus demonstrate no significant findings. Lungs/Pleura: Moderate central peribronchial thickening. Scattered mild interlobular septal thickening. Scattered upper lobe predominant layering dependent ground-glass densities. Focal consolidation in the right lower lobe with centrilobular nodularity. More focal centrilobular nodularity in the anterior inferior right upper lobe and medial right middle lobe. Small bilateral pleural effusions. No pneumothorax. Upper Abdomen: No acute abnormality. Musculoskeletal: No chest wall abnormality. No acute or significant osseous findings. Review of the MIP images confirms the above findings. IMPRESSION: 1. Consolidation and extensive centrilobular nodularity within the basilar right lower lobe, favored to reflect bronchopneumonia. Additional more focal centrilobular nodularity in medial right middle lobe and anterior inferior right upper lobe also likely represent additional areas of infection. Followup PA and lateral chest X-ray is recommended in 3-4 weeks following trial of antibiotic therapy to ensure resolution. 2. Mild interstitial and alveolar pulmonary edema. Small bilateral pleural effusions. 3. No evidence of pulmonary embolism. Reflux of contrast into the hepatic veins, suggestive of right heart dysfunction. 4.  Aortic atherosclerosis (ICD10-I70.0). Electronically Signed   By: WTitus DubinM.D.   On: 03/11/2018 21:19   Dg Chest Port 1 View  Result Date: 03/11/2018 CLINICAL DATA:  Shortness of breath. Current history of CHF and COPD. Indwelling pacing defibrillator. EXAM: PORTABLE CHEST 1 VIEW COMPARISON:  12/10/2017,  11/10/2017 and earlier. FINDINGS: Cardiac silhouette upper normal in size for AP portable technique. LEFT subclavian dual lead pacing defibrillator unchanged and appears intact. Thoracic aorta  atherosclerotic. Hilar and mediastinal contours otherwise unremarkable. Moderate diffuse interstitial pulmonary edema, new since the prior examinations. No visible pleural effusions. No confluent airspace consolidation. IMPRESSION: Acute CHF, with moderate diffuse interstitial pulmonary edema. Electronically Signed   By: Evangeline Dakin M.D.   On: 03/11/2018 10:56      Assessment & Plan:   57 year old patient seen for acute visit/hospital follow-up today.  Will stop aspirin currently.  We will have patient follow-up in 1 week.  We will have patient stop using Protonix and continue with Zantac.  Will stop lisinopril.  Continue BiPAP use.  Continue 5 L of oxygen at night.  Will reorder ONO.  Patient to start Z-Pak and Cipro as prescribed by hospitalist team at discharge for pneumonia.  Will have patient get chest x-ray in 3 to 4 weeks.  Chronic respiratory failure with hypoxia (HCC)  Follow-up with our office in 1 to 2 weeks  Continue BiPAP use nightly >>>Continue 5 L of oxygen at night   Right lower lobe pneumonia (HCC) Proceed forward with Z-Pak as well as ciprofloxacin as prescribed by hospitalist at discharge  Will need chest x-ray in 3 to 4 weeks  Follow-up with our office in 1 to 2 weeks  Continue probiotic daily  We will change Protonix >>> Stop Protonix >>>Continue Zantac 373m daily  Stop daily asa today  >>> Contact cardiology regarding her Plavix dose  Can use Mucinex DM to help with mucus and congestion    Cough Proceed forward with Z-Pak as well as ciprofloxacin as prescribed by hospitalist at discharge  Will need chest x-ray in 3 to 4 weeks  Follow-up with our office in 1 to 2 weeks  Continue probiotic daily  We will change Protonix >>> Stop Protonix >>>Continue Zantac 3067mdaily  Stop daily asa today  >>> Contact cardiology regarding her Plavix dose  Can use Mucinex DM to help with mucus and congestion    Tobacco dependence Continue not  smoking Continue nicotine patch Do not use electronic cigarettes or vaping  We recommend that you continue to stop smoking.   Smoking Cessation Resources:  1 800 QUIT NOW  >>> Patient to call this resource and utilize it to help support her quit smoking >>> Keep up your hard work with stopping smoking  You can also contact the [facility]>>For smoking cessation classes call [phone] do not recommend using e-cigarettes as a form of stopping smoking           Marijuana use, episodic We do not recommend that you smoke marijuana or use THC cartridges  Continue not using this so that we can better manage your respiratory status     BrLauraine RinneNP 03/15/2018

## 2018-03-15 NOTE — Assessment & Plan Note (Signed)
We do not recommend that you smoke marijuana or use THC cartridges  Continue not using this so that we can better manage your respiratory status

## 2018-03-15 NOTE — Assessment & Plan Note (Signed)
Proceed forward with Z-Pak as well as ciprofloxacin as prescribed by hospitalist at discharge  Will need chest x-ray in 3 to 4 weeks  Follow-up with our office in 1 to 2 weeks  Continue probiotic daily  We will change Protonix >>> Stop Protonix >>>Continue Zantac 310m daily  Stop daily asa today  >>> Contact cardiology regarding her Plavix dose  Can use Mucinex DM to help with mucus and congestion

## 2018-03-15 NOTE — Assessment & Plan Note (Signed)
  Follow-up with our office in 1 to 2 weeks  Continue BiPAP use nightly >>>Continue 5 L of oxygen at night

## 2018-03-15 NOTE — Patient Instructions (Addendum)
Proceed forward with Z-Pak as well as ciprofloxacin as prescribed by hospitalist at discharge  Will need chest x-ray in 3 to 4 weeks  Follow-up with our office in 1 to 2 weeks  Continue probiotic daily  We will change Protonix >>> Stop Protonix >>>Continue Zantac 366m daily  Stop Lisinopril   Stop daily asa today  >>> Contact cardiology regarding her Plavix dose  Continue BiPAP use nightly >>>Continue 5 L of oxygen at night  Can use Mucinex DM to help with mucus and congestion    It is flu season:   >>>Remember to be washing your hands regularly, using hand sanitizer, be careful to use around herself with has contact with people who are sick will increase her chances of getting sick yourself. >>> Best ways to protect herself from the flu: Receive the yearly flu vaccine, practice good hand hygiene washing with soap and also using hand sanitizer when available, eat a nutritious meals, get adequate rest, hydrate appropriately   Please contact the office if your symptoms worsen or you have concerns that you are not improving.   Thank you for choosing [facility] for your healthcare, and for allowing [facility] partner with you on your healthcare journey. I am thankful to be able to provide care to you today.   BWyn QuakerFNP-C

## 2018-03-15 NOTE — Assessment & Plan Note (Signed)
Continue not smoking Continue nicotine patch Do not use electronic cigarettes or vaping  We recommend that you continue to stop smoking.   Smoking Cessation Resources:  1 800 QUIT NOW  >>> Patient to call this resource and utilize it to help support her quit smoking >>> Keep up your hard work with stopping smoking  You can also contact the [facility] >>>For smoking cessation classes call [phone]  We do not recommend using e-cigarettes as a form of stopping smoking

## 2018-03-17 NOTE — Progress Notes (Signed)
Reviewed and agree with assessment/plan.   Lanessa Shill, MD [facility] 07/08/2016, 12:24 PM Pager:  [phone]  

## 2018-03-21 NOTE — Progress Notes (Signed)
Chart and office note reviewed in detail  > agree with a/p as outlined    

## 2018-03-22 ENCOUNTER — Encounter: Payer: Self-pay | Admitting: Pulmonary Disease

## 2018-03-22 ENCOUNTER — Ambulatory Visit: Payer: BLUE CROSS/BLUE SHIELD | Admitting: Pulmonary Disease

## 2018-03-22 VITALS — BP 110/70 | HR 73 | Ht 70.0 in | Wt 201.4 lb

## 2018-03-22 DIAGNOSIS — G4731 Primary central sleep apnea: Secondary | ICD-10-CM

## 2018-03-22 DIAGNOSIS — R042 Hemoptysis: Secondary | ICD-10-CM

## 2018-03-22 DIAGNOSIS — F17200 Nicotine dependence, unspecified, uncomplicated: Secondary | ICD-10-CM

## 2018-03-22 DIAGNOSIS — J181 Lobar pneumonia, unspecified organism: Secondary | ICD-10-CM | POA: Diagnosis not present

## 2018-03-22 DIAGNOSIS — J189 Pneumonia, unspecified organism: Secondary | ICD-10-CM

## 2018-03-22 DIAGNOSIS — G4739 Other sleep apnea: Secondary | ICD-10-CM

## 2018-03-22 DIAGNOSIS — F17210 Nicotine dependence, cigarettes, uncomplicated: Secondary | ICD-10-CM | POA: Diagnosis not present

## 2018-03-22 DIAGNOSIS — J9611 Chronic respiratory failure with hypoxia: Secondary | ICD-10-CM | POA: Diagnosis not present

## 2018-03-22 DIAGNOSIS — Z23 Encounter for immunization: Secondary | ICD-10-CM | POA: Diagnosis not present

## 2018-03-22 NOTE — Assessment & Plan Note (Signed)
Continue Bipap nightly  Overnight oximetry ordered last week    Please continue to wear 5 L of oxygen at night  Please check with [employer] to figure out why you are no longer receiving coverage for your oxygen  Follow-up with our office in 3 to 4 weeks with chest x-ray before.  Follow-up with our office sooner if you have worsening symptoms, fever, coughing up blood.

## 2018-03-22 NOTE — Patient Instructions (Addendum)
Continue Bipap nightly  Overnight oximetry ordered last week    Flu vaccine today   Please continue to wear 5 L of oxygen at night  Please check with [employer] to figure out why you are no longer receiving coverage for your oxygen   We recommend that you continue using your BIPAP daily >>>Keep up the hard work using your device >>> Goal should be wearing this for the entire night that you are sleeping, at least 4 to 6 hours  Remember:  . Do not drive or operate heavy machinery if tired or drowsy.  . Please notify the supply company and office if you are unable to use your device regularly due to missing supplies or machine being broken.  . Work on maintaining a healthy weight and following your recommended nutrition plan  . Maintain proper daily exercise and movement  . Maintaining proper use of your device can also help improve management of other chronic illnesses such as: Blood pressure, blood sugars, and weight management.   BiPAP/ CPAP Cleaning:  >>>Clean weekly, with Dawn soap, and bottle brush.  Set up to air dry.      We recommend that you stop smoking.   Smoking Cessation Resources:  1 800 QUIT NOW  >>> Patient to call this resource and utilize it to help support her quit smoking >>> Keep up your hard work with stopping smoking  You can also contact the [facility] >>>For smoking cessation classes call [phone]  We do not recommend using e-cigarettes as a form of stopping smoking  Do not inhale or "vape" marijuana or THC products.  Do not continue to smoke cigarettes.  Do not smoke marijuana.   Follow-up with our office in 3 to 4 weeks with chest x-ray before.  Follow-up with our office sooner if you have worsening symptoms, fever, coughing up blood.       It is flu season:   >>>Remember to be washing your hands regularly, using hand sanitizer, be careful to use around herself with has contact with people who are sick will increase her  chances of getting sick yourself. >>> Best ways to protect herself from the flu: Receive the yearly flu vaccine, practice good hand hygiene washing with soap and also using hand sanitizer when available, eat a nutritious meals, get adequate rest, hydrate appropriately   Please contact the office if your symptoms worsen or you have concerns that you are not improving.   Thank you for choosing [facility] for your healthcare, and for allowing [place] to partner with you on your healthcare journey. I am thankful to be able to provide care to you today.   Wyn Quaker FNP-C       Coping with Quitting Smoking Quitting smoking is a physical and mental challenge. You will face cravings, withdrawal symptoms, and temptation. Before quitting, work with your health care provider to make a plan that can help you cope. Preparation can help you quit and keep you from giving in. How can I cope with cravings? Cravings usually last for 5-10 minutes. If you get through it, the craving will pass. Consider taking the following actions to help you cope with cravings:  Keep your mouth busy: ? Chew sugar-free gum. ? Suck on hard candies or a straw. ? Brush your teeth.  Keep your hands and body busy: ? Immediately change to a different activity when you feel a craving. ? Squeeze or play with a ball. ? Do an activity or  a hobby, like making bead jewelry, practicing needlepoint, or working with wood. ? Mix up your normal routine. ? Take a short exercise break. Go for a quick walk or run up and down stairs. ? Spend time in public places where smoking is not allowed.  Focus on doing something kind or helpful for someone else.  Call a friend or family member to talk during a craving.  Join a support group.  Call a quit line, such as 1-800-QUIT-NOW.  Talk with your health care provider about medicines that might help you cope with cravings and make quitting easier for you.  How can I deal with  withdrawal symptoms? Your body may experience negative effects as it tries to get used to not having nicotine in the system. These effects are called withdrawal symptoms. They may include:  Feeling hungrier than normal.  Trouble concentrating.  Irritability.  Trouble sleeping.  Feeling depressed.  Restlessness and agitation.  Craving a cigarette.  To manage withdrawal symptoms:  Avoid places, people, and activities that trigger your cravings.  Remember why you want to quit.  Get plenty of sleep.  Avoid coffee and other caffeinated drinks. These may worsen some of your symptoms.  How can I handle social situations? Social situations can be difficult when you are quitting smoking, especially in the first few weeks. To manage this, you can:  Avoid parties, bars, and other social situations where people might be smoking.  Avoid alcohol.  Leave right away if you have the urge to smoke.  Explain to your family and friends that you are quitting smoking. Ask for understanding and support.  Plan activities with friends or family where smoking is not an option.  What are some ways I can cope with stress? Wanting to smoke may cause stress, and stress can make you want to smoke. Find ways to manage your stress. Relaxation techniques can help. For example:  Breathe slowly and deeply, in through your nose and out through your mouth.  Listen to soothing, relaxing music.  Talk with a family member or friend about your stress.  Light a candle.  Soak in a bath or take a shower.  Think about a peaceful place.  What are some ways I can prevent weight gain? Be aware that many people gain weight after they quit smoking. However, not everyone does. To keep from gaining weight, have a plan in place before you quit and stick to the plan after you quit. Your plan should include:  Having healthy snacks. When you have a craving, it may help to: ? Eat plain popcorn, crunchy carrots,  celery, or other cut vegetables. ? Chew sugar-free gum.  Changing how you eat: ? Eat small portion sizes at meals. ? Eat 4-6 small meals throughout the day instead of 1-2 large meals a day. ? Be mindful when you eat. Do not watch television or do other things that might distract you as you eat.  Exercising regularly: ? Make time to exercise each day. If you do not have time for a long workout, do short bouts of exercise for 5-10 minutes several times a day. ? Do some form of strengthening exercise, like weight lifting, and some form of aerobic exercise, like running or swimming.  Drinking plenty of water or other low-calorie or no-calorie drinks. Drink 6-8 glasses of water daily, or as much as instructed by your health care provider.  Summary  Quitting smoking is a physical and mental challenge. You will face cravings, withdrawal symptoms,  and temptation to smoke again. Preparation can help you as you go through these challenges.  You can cope with cravings by keeping your mouth busy (such as by chewing gum), keeping your body and hands busy, and making calls to family, friends, or a helpline for people who want to quit smoking.  You can cope with withdrawal symptoms by avoiding places where people smoke, avoiding drinks with caffeine, and getting plenty of rest.  Ask your health care provider about the different ways to prevent weight gain, avoid stress, and handle social situations. This information is not intended to replace advice given to you by your health care provider. Make sure you discuss any questions you have with your health care provider. Document Released: 06/26/2016 Document Revised: 06/26/2016 Document Reviewed: 06/26/2016 Elsevier Interactive Patient Education  2018 [place] Oxygen Use, Adult When a medical condition keeps you from getting enough oxygen, your health care provider may instruct you to take extra oxygen at home. Your health care provider  will let you know:  When to take oxygen.  For how long to take oxygen.  How quickly oxygen should be delivered (flow rate), in liters per minute (LPM or L/M).  Home oxygen can be given through:  A mask.  A nasal cannula. This is a device or tube that goes in the nostrils.  A transtracheal catheter. This is a small, flexible tube placed in the trachea.  A tracheostomy. This is a surgically made opening in the trachea.  These devices are connected with tubing to an oxygen source, such as:  A tank. Tanks hold oxygen in gas form. They must be replaced when the oxygen is used up.  A liquid oxygen device. This holds oxygen in liquid form. It must be replaced when the oxygen is used up.  An oxygen concentrator machine. This filters oxygen in the room. It uses electricity, so you must have a backup cylinder of oxygen in case the power goes out.  Supplies needed: To use oxygen, you will need:  A mask, nasal cannula, transtracheal catheter, or tracheostomy.  An oxygen tank, a liquid oxygen device, or an oxygen concentrator.  The tape that your health care provider recommends (optional).  If you use a transtracheal catheter and your prescribed flow rate is 1 LPM or greater, you will also need a humidifier. Risks and complications  Fire. This can happen if the oxygen is exposed to a heat source, flame, or spark.  Injury to skin. This can happen if liquid oxygen touches your skin.  Organ damage. This can happen if you get too little oxygen. How to use oxygen Your health care provider will show you how to use your oxygen device. Follow her or his instructions. They may look something like this: 1. Wash your hands. 2. If you use an oxygen concentrator, make sure it is plugged in. 3. Place one end of the tube into the port on the tank, device, or machine. 4. Place the mask over your nose and mouth. Or, place the nasal cannula and secure it with tape if instructed. If you use a  tracheostomy or transtracheal catheter, connect it to the oxygen source as directed. 5. Make sure the liter-flow setting on the machine is at the level prescribed by your health care provider. 6. Turn on the machine or adjust the knob on the tank or device to the correct liter-flow setting. 7. When you are done, turn off and unplug the machine, or turn  the knob to OFF.  How to clean and care for the oxygen supplies Nasal cannula  Clean it with a warm, wet cloth daily or as needed.  Wash it with a liquid soap once a week.  Rinse it thoroughly once or twice a week.  Replace it every 2-4 weeks.  If you have an infection, such as a cold or pneumonia, change the cannula when you get better. Mask  Replace it every 2-4 weeks.  If you have an infection, such as a cold or pneumonia, change the mask when you get better. Humidifier bottle  Wash the bottle between each refill: ? Wash it with soap and warm water. ? Rinse it thoroughly. ? Disinfect it and its top. ? Air-dry it.  Make sure it is dry before you refill it. Oxygen concentrator  Clean the air filter at least twice a week according to directions from your home medical equipment and service company.  Wipe down the cabinet every day. To do this: ? Unplug the unit. ? Wipe down the cabinet with a damp cloth. ? Dry the cabinet. Other equipment  Change any extra tubing every 1-3 months.  Follow instructions from your health care provider about taking care of any other equipment. Safety tips Fire safety tips   Keep your oxygen and oxygen supplies at least 5 ft away from sources of heat, flames, and sparks at all times.  Do not allow smoking near your oxygen. Put up "no smoking" signs in your home.  Do not use materials that can burn (are flammable) while you use oxygen.  When you go to a restaurant with portable oxygen, ask to be seated in the nonsmoking section.  Keep a Data processing manager close by. Let your fire  department know that you have oxygen in your home.  Test your home smoke detectors regularly. General safety tips  If you use an oxygen cylinder, make sure it is in a stand or secured to an object that will not move (fixed object).  If you use liquid oxygen, make sure its container is kept upright.  If you use an oxygen concentrator: ? [employer] psych[employer] company. Make sure you are given priority service in the event that your power goes out. ? Avoid using extension cords, if possible. Follow these instructions at home:  Use oxygen only as told by your health care provider.  Do not use alcohol or other drugs that make you relax (sedating drugs) unless instructed. They can slow down your breathing rate and make it hard to get in enough oxygen.  Know how and when to order a refill of oxygen.  Always keep a spare tank of oxygen. Plan ahead for holidays when you may not be able to get a prescription filled.  Use water-based lubricants on your lips or nostrils. Do not use oil-based products like petroleum jelly.  To prevent skin irritation on your cheeks or behind your ears, tuck some gauze under the tubing. Contact a health care provider if:  You get headaches often.  You have shortness of breath.  You have a lasting cough.  You have anxiety.  You are sleepy all the time.  You develop an illness that affects your breathing.  You cannot exercise at your regular level.  You are restless.  You have difficult or irregular breathing, and it is getting worse.  You have a fever.  You have persistent redness under your nose. Get help right away if:  You are confused.  You have  blue lips or fingernails.  You are struggling to breathe. This information is not intended to replace advice given to you by your health care provider. Make sure you discuss any questions you have with your health care provider. Document Released: 09/19/2003 Document Revised: 02/26/2016 Document  Reviewed: 01/21/2016 Elsevier Interactive Patient Education  2018 [place].     Influenza Virus Vaccine injection What is this medicine? INFLUENZA VIRUS VACCINE (in floo EN zuh VAHY ruhs vak SEEN) helps to reduce the risk of getting influenza also known as the flu. The vaccine only helps protect you against some strains of the flu. This medicine may be used for other purposes; ask your health care provider or pharmacist if you have questions. COMMON BRAND NAME(S): Afluria, Agriflu, Alfuria, FLUAD, Fluarix, Fluarix Quadrivalent, Flublok, Flublok Quadrivalent, FLUCELVAX, Flulaval, Fluvirin, Fluzone, Fluzone High-Dose, Fluzone Intradermal What should I tell my health care provider before I take this medicine? They need to know if you have any of these conditions: -bleeding disorder like hemophilia -fever or infection -Guillain-Barre syndrome or other neurological problems -immune system problems -infection with the human immunodeficiency virus (HIV) or AIDS -low blood platelet counts -multiple sclerosis -an unusual or allergic reaction to influenza virus vaccine, latex, other medicines, foods, dyes, or preservatives. Different brands of vaccines contain different allergens. Some may contain latex or eggs. Talk to your doctor about your allergies to make sure that you get the right vaccine. -pregnant or trying to get pregnant -breast-feeding How should I use this medicine? This vaccine is for injection into a muscle or under the skin. It is given by a health care professional. A copy of Vaccine Information Statements will be given before each vaccination. Read this sheet carefully each time. The sheet may change frequently. Talk to your healthcare provider to see which vaccines are right for you. Some vaccines should not be used in all age groups. Overdosage: If you think you have taken too much of this medicine contact a poison control center or emergency room at once. NOTE: This  medicine is only for you. Do not share this medicine with others. What if I miss a dose? This does not apply. What may interact with this medicine? -chemotherapy or radiation therapy -medicines that lower your immune system like etanercept, anakinra, infliximab, and adalimumab -medicines that treat or prevent blood clots like warfarin -phenytoin -steroid medicines like prednisone or cortisone -theophylline -vaccines This list may not describe all possible interactions. Give your health care provider a list of all the medicines, herbs, non-prescription drugs, or dietary supplements you use. Also tell them if you smoke, drink alcohol, or use illegal drugs. Some items may interact with your medicine. What should I watch for while using this medicine? Report any side effects that do not go away within 3 days to your doctor or health care professional. Call your health care provider if any unusual symptoms occur within 6 weeks of receiving this vaccine. You may still catch the flu, but the illness is not usually as bad. You cannot get the flu from the vaccine. The vaccine will not protect against colds or other illnesses that may cause fever. The vaccine is needed every year. What side effects may I notice from receiving this medicine? Side effects that you should report to your doctor or health care professional as soon as possible: -allergic reactions like skin rash, itching or hives, swelling of the face, lips, or tongue Side effects that usually do not require medical attention (report to your doctor or  health care professional if they continue or are bothersome): -fever -headache -muscle aches and pains -pain, tenderness, redness, or swelling at the injection site -tiredness This list may not describe all possible side effects. Call your doctor for medical advice about side effects. You may report side effects to FDA at 1-800-FDA-1088. Where should I keep my medicine? The vaccine will be  given by a health care professional in a clinic, pharmacy, doctor's office, or other health care setting. You will not be given vaccine doses to store at home. NOTE: This sheet is a summary. It may not cover all possible information. If you have questions about this medicine, talk to your doctor, pharmacist, or health care provider.  2018 Elsevier/Gold Standard (2015-01-18 10:07:28)

## 2018-03-22 NOTE — Assessment & Plan Note (Signed)
We recommend that you stop smoking.   Smoking Cessation Resources:  1 800 QUIT NOW  >>> Patient to call this resource and utilize it to help support her quit smoking >>> Keep up your hard work with stopping smoking  You can also contact the [facility] >>>For smoking cessation classes call [phone]  We do not recommend using e-cigarettes as a form of stopping smoking  Do not inhale or "vape" marijuana or THC products.  Do not continue to smoke cigarettes.  Do not smoke marijuana.   Follow-up with our office in 3 to 4 weeks with chest x-ray before.  Follow-up with our office sooner if you have worsening symptoms, fever, coughing up blood.

## 2018-03-22 NOTE — Assessment & Plan Note (Signed)
Continue BiPAP daily   We recommend that you continue using your BIPAP daily >>>Keep up the hard work using your device >>> Goal should be wearing this for the entire night that you are sleeping, at least 4 to 6 hours  Remember:  . Do not drive or operate heavy machinery if tired or drowsy.  . Please notify the supply company and office if you are unable to use your device regularly due to missing supplies or machine being broken.  . Work on maintaining a healthy weight and following your recommended nutrition plan  . Maintain proper daily exercise and movement  . Maintaining proper use of your device can also help improve management of other chronic illnesses such as: Blood pressure, blood sugars, and weight management.   BiPAP/ CPAP Cleaning:  >>>Clean weekly, with Dawn soap, and bottle brush.  Set up to air dry.

## 2018-03-22 NOTE — Progress Notes (Signed)
@Patient  ID: [name], male    DOB: 1961/03/03, 57 y.o.   MRN: [id number]  Chief Complaint  Patient presents with  . Follow-up    pnu follow up      Referring provider: No ref. provider found  HPI:  57 year old male current every day smoker followed in our office for obstructive sleep apnea, oxygen dependent chronic respiratory failure (5 L of oxygen via BiPAP at night)  PMH:  Polycythemia, CHF (EF 30-35 percent), ICD  Smoker/ Smoking History: Current Smoker. 14.5 pack years. Pt also occasionally vaping prior to inpt stay.  Maintenance: none    Pt of: Dr. Halford Chessman / Dr. Melvyn Novas  Recent [facility] Encounters:   03/15/2018  - Visit - BM 57 year old patient presenting today for hospital follow-up.  Patient reports he had 2 episodes of hemoptysis since being discharged from the hospital yesterday on 03/14/2018.  Patient also reports that he had prior episodes of hemoptysis prior to his hospitalization.  Patient was diagnosed via a CT with a pneumonia and prescribed antibiotics.  Patient has not started antibiotics yet.  Patient was also treated with IV vancomycin during his inpatient stay.  Patient reports adherence to his BiPAP and using 5 L of O2 at night.  Patient does not believe that he has pneumonia.  Of note patient reports that he recently stopped smoking.  Patient is currently wearing a nicotine patch.  Patient does endorse using a electronic cigarette as well as vaping marijuana occasionally prior to becoming acutely ill.  Patient has not done any vaping or smoking since being admitted to the hospital. Plan: Patient will stop aspirin, continue BiPAP as well as 5 L of oxygen at night, will reorder overnight oximetry, patient to start Z-Pak as well as ciprofloxacin.  Patient to get chest x-ray in 3 to 4 weeks.  Patient to follow-up with our office in 1 to 2 weeks.   03/22/2018  - Visit   57 year old patient presents today for one-week follow-up after hospital discharge with  pneumonia.  Patient was also having brief episodes of hemoptysis at that point time.  Patient has since then completed his azithromycin antibiotics.  Patient did not complete his ciprofloxacin antibiotics that was given at discharge per his pharmacist who told him not to do this.  Patient feels clinically better as if symptoms have improved.  BiPAP compliance report showing 18 out of 30 days use with only 14 of those days greater than 4 hours.  Average usage 4 hours and 47 minutes.  Patient has significant breaks in therapy specifically August 13 through August 19 were patient was out of town and did not wear his BiPAP or his oxygen.  Patient reports he was visiting his 72 year old girlfriend in [place] who "does not understand why he needs BiPAP and oxygen at night." Also has a break when he was hospitalized.  Patient reports no issues with using his BiPAP and realizes now after his hospitalization is important for him to wear nightly.  Is also important for patient to wear oxygen nightly patient says he realizes this now that he saw how low his oxygen levels dropped when he was hospitalized and he took off his oxygen.    Tests:   Sleep tests PSG 10/14/12 >> AHI 63  Pulmonary tests PFT 11/08/13 >> FEV1 4.23 (103%), FEV1% 79, TLC 6.67 (90%), DLCO 102% CT chest 11/05/15 >> atherosclerosis, air-fluid level in esophagus 03/11/2018-CT Angio- consolidation extensive centrilobular nodularity within the basilar right lower lobe favored  to reflect bronchopneumonia, repeat chest x-ray in 3 to 4 weeks, no evidence of PE  Cardiac tests Echo 05/24/12 > EF 30 to 35% 03/12/18-echocardiogram-LV ejection fraction 30 to 35%   Chart Review:  03/11/2018- hospitalization-acute on chronic congestive heart failure >>>Discharge date 03/14/2018   Chart Review:     Specialty Problems      Pulmonary Problems   Complex sleep apnea syndrome    NPSG 10/2012:  AHI 63/hr, with centrals >> obstructions. Titration  01/2013:  Failed cpap, bipap.  Started ASV with epap 6cm, PS 3-15cm.  Large resmed airfit F10 FFM.  ONO 03/2014:  desat to 82%, 236min less than 88%, however unknown whether pt wore ASV device during the ONO study.       Hemoptysis    Onset 12/25/16  - rx doxy thru 6/120 then levaquin 01/01/17 x 7 d @ 500 mg /day > resolved 01/08/2017       COPD  GOLD 0    PFTS [facility]  11/08/13 wnl including dlco except for mild curvature to f/v loop c/w small airways dz      Chronic respiratory failure with hypoxia (HCC)   Treatment-emergent central sleep apnea   Acute respiratory failure with hypoxia (HCC)   Hypoxia   Cough   Right lower lobe pneumonia (HCC)      Allergies  Allergen Reactions  . Darvocet [Propoxyphene N-Acetaminophen] Anaphylaxis    Throat closes  . Propoxyphene Anaphylaxis and Swelling    Immunization History  Administered Date(s) Administered  . Influenza Split 04/12/2013, 09/11/2015  . Influenza Whole 05/25/2012  . Influenza,inj,Quad PF,6+ Mos 06/11/2014, 09/25/2015, 05/11/2016, 07/01/2016, 06/14/2017, 03/22/2018  . Influenza-Unspecified 05/25/2012, 04/20/2013, 03/14/2017  . Pneumococcal Conjugate-13 06/11/2014  . Pneumococcal Polysaccharide-23 07/13/2013, 08/10/2013  . Td 08/10/2013    Past Medical History:  Diagnosis Date  . Anxiety   . Automatic implantable cardiac defibrillator [employer]    Analyze ST  . Benign prostatic hypertrophy   . Benzodiazepine dependence (HCC)    chronic  . CAD (coronary artery disease)    Last cath 2/12. 3-v CAD. Failed PCI of distal RCAc  CATH [facility] 4/13 with DES to  LAD X2  . CHF (congestive heart failure) ([place])   . Chronic back pain    lumbar stenosis  . Chronic systolic heart failure (HCC)    EF 20-25%. s/p ST[employer] Jude ICD  . Depression   . DJD (degenerative joint disease)   . History of testicular cancer   . Narcotic dependence (HCC)    chronic  . Vitamin B12 deficiency 07/22/2016    Tobacco History: Social History   Tobacco  Use  Smoking Status Current Every Day Smoker  . Packs/day: 0.50  . Years: 29.00  . Pack years: 14.50  . Types: Cigarettes, E-cigarettes  . Last attempt to quit: 07/14/2015  . Years since quitting: 2.6  Smokeless Tobacco Never Used  Tobacco Comment   no cigarette in 4 days but yes to e-cig   Ready to quit: Not Answered Counseling given: Not Answered Comment: no cigarette in 4 days but yes to e-cig  Patient reports that he had 2 drags of his cigarette today.  Patient reports he is willing to try to stop smoking.  Smoking assessment and cessation counseling  Patient currently smoking: 2 drags of a cigarette today  I have advised the patient to quit/stop smoking as soon as possible due to high risk for multiple medical problems.  It will also be very difficult for [place] to manage  patient's  respiratory symptoms and status if we continue to expose her lungs to a known irritant.  We do not advise e-cigarettes as a form of stopping smoking.  Patient is willing to quit smoking.  I have advised the patient that we can assist and have options of nicotine replacement therapy, provided smoking cessation education today, provided smoking cessation counseling, and provided cessation resources.  Follow-up next office visit office visit for assessment of smoking cessation.    Smoking cessation counseling advised for: 5 min   Outpatient Encounter Medications as of 03/22/2018  Medication Sig  . albuterol (VENTOLIN HFA) 108 (90 Base) MCG/ACT inhaler Inhale 2 puffs into the lungs every 6 (six) hours as needed for wheezing or shortness of breath.  . alfuzosin (UROXATRAL) 10 MG 24 hr tablet Take 10 mg by mouth as needed. For UTI  . ALPRAZolam (XANAX) 1 MG tablet Take 1 mg by mouth 4 (four) times daily.   . carvedilol (COREG) 25 MG tablet TAKE 1 TABLET TWICE DAILY WITH A MEAL. (Patient taking differently: Take 25 mg by mouth 2 (two) times daily with a meal. )  . clopidogrel (PLAVIX) 75 MG tablet TAKE 1  TABLET EACH DAY. (Patient taking differently: Take 75 mg by mouth daily. )  . cyanocobalamin (,VITAMIN B-12,) 1000 MCG/ML injection Inject 1 mL (1,000 mcg total) into the muscle once a week. For 4 weeks and then once per month  . diazepam (VALIUM) 10 MG tablet Take 10 mg by mouth at bedtime as needed for anxiety.   Marland Kitchen ezetimibe (ZETIA) 10 MG tablet TAKE 1 TABLET EACH DAY. (Patient taking differently: Take 10 mg by mouth daily. )  . gabapentin (NEURONTIN) 300 MG capsule Take 3 capsules (900 mg total) by mouth 3 (three) times daily.  Marland Kitchen lactulose (CHRONULAC) 10 GM/15ML solution Take 15 mLs (10 g total) by mouth 2 (two) times daily as needed for mild constipation.  . methadone (METHADOSE) 40 MG disintegrating tablet Take 200 mg by mouth daily.  . nitroGLYCERIN (NITROLINGUAL) 0.4 MG/SPRAY spray Place under the tongue.  . ondansetron (ZOFRAN-ODT) 8 MG disintegrating tablet Take 1 tablet (8 mg total) by mouth every 8 (eight) hours as needed for nausea or vomiting.  . polyethylene glycol powder (GLYCOLAX/MIRALAX) powder MIX 1 CAPFUL TWICE DAILY AS NEEDED.  . ranitidine (ZANTAC) 150 MG tablet Take 300 mg by mouth at bedtime.  . rosuvastatin (CRESTOR) 40 MG tablet TAKE 1 TABLET DAILY. (Patient taking differently: Take 40 mg by mouth every evening. )  . spironolactone (ALDACTONE) 25 MG tablet TAKE (1/2) TABLET DAILY. (Patient taking differently: Take 25 mg by mouth daily. TAKE (1/2) TABLET DAILY.)  . sucralfate (CARAFATE) 1 g tablet Take 1 tablet (1 g total) by mouth 4 (four) times daily -  with meals and at bedtime.  . tamsulosin (FLOMAX) 0.4 MG CAPS capsule Take 1 capsule (0.4 mg total) by mouth daily after breakfast.  . torsemide (DEMADEX) 20 MG tablet Take 1 tablet (20 mg total) by mouth daily as needed. For swelling in legs  . traZODone (DESYREL) 100 MG tablet Take 50 mg by mouth at bedtime as needed for sleep.   Marland Kitchen triamcinolone (NASACORT AQ) 55 MCG/ACT AERO nasal inhaler Place 1 spray into the nose daily.   Marland Kitchen VIAGRA 100 MG tablet TAKE 1 TABLET APPROXIMATELY 1 HOUR BEFORE NEEDING AS DIRECTED. (Patient taking differently: Take 100 mg by mouth as needed for erectile dysfunction. )  . ciprofloxacin (CIPRO) 750 MG tablet Take 1 tablet (750 mg total)  by mouth 2 (two) times daily. (Patient not taking: Reported on 03/22/2018)   Facility-Administered Encounter Medications as of 03/22/2018  Medication  . cyanocobalamin ((VITAMIN B-12)) injection 1,000 mcg     Review of Systems  Review of Systems  Constitutional: Positive for fatigue. Negative for activity change, chills, fever and unexpected weight change.  HENT: Negative for congestion, postnasal drip, rhinorrhea, sinus pressure, sinus pain, sneezing and sore throat.   Eyes: Negative.   Respiratory: Negative for cough, shortness of breath and wheezing.        Denies hemoptysis   Cardiovascular: Negative for chest pain and palpitations.  Gastrointestinal: Negative for constipation, diarrhea, nausea and vomiting.  Endocrine: Negative.   Musculoskeletal: Positive for gait problem.       Knee pain   Skin: Negative.   Neurological: Negative for dizziness and headaches.  Psychiatric/Behavioral: Negative.  Negative for dysphoric mood. The patient is not nervous/anxious.   All other systems reviewed and are negative.    Physical Exam  BP 110/70 (BP Location: Left Arm, Cuff Size: Normal)   Pulse 73   Ht 5\' 10"  (1.778 m)   Wt 201 lb 6.4 oz (91.4 kg)   SpO2 97%   BMI 28.90 kg/m   Wt Readings from Last 5 Encounters:  03/22/18 201 lb 6.4 oz (91.4 kg)  03/15/18 202 lb (91.6 kg)  03/14/18 200 lb 1.6 oz (90.8 kg)  02/22/18 208 lb (94.3 kg)  02/04/18 213 lb (96.6 kg)     Physical Exam  Constitutional: He is oriented to person, place, and time and well-developed, well-nourished, and in no distress. No distress.  HENT:  Head: Normocephalic and atraumatic.  Right Ear: Hearing, tympanic membrane, external ear and ear canal normal.  Left Ear:  Hearing, tympanic membrane, external ear and ear canal normal.  Nose: Nose normal. Right sinus exhibits no maxillary sinus tenderness and no frontal sinus tenderness. Left sinus exhibits no maxillary sinus tenderness and no frontal sinus tenderness.  Mouth/Throat: Uvula is midline and oropharynx is clear and moist. No oropharyngeal exudate.  Eyes: Pupils are equal, round, and reactive to light.  Neck: Normal range of motion. Neck supple. No JVD present.  Cardiovascular: Normal rate, regular rhythm and normal heart sounds.  Pulmonary/Chest: Effort normal and breath sounds normal. No accessory muscle usage. No respiratory distress. He has no decreased breath sounds. He has no wheezes. He has no rhonchi.  Musculoskeletal: Normal range of motion. He exhibits no edema.  Lymphadenopathy:    He has no cervical adenopathy.  Neurological: He is alert and oriented to person, place, and time. Gait normal.  Skin: Skin is warm and dry. He is not diaphoretic. No erythema.  Psychiatric: Mood, memory, affect and judgment normal.  Nursing note and vitals reviewed.     Lab Results:  CBC    Component Value Date/Time   WBC 9.5 03/11/2018 0944   RBC 5.28 03/11/2018 0944   HGB 17.7 (H) 03/11/2018 0944   HGB 16.1 12/22/2017 1628   HCT 53.6 (H) 03/11/2018 0944   HCT 46.9 12/22/2017 1628   PLT 209 03/11/2018 0944   PLT 165 12/22/2017 1628   MCV 101.5 (H) 03/11/2018 0944   MCV 96 12/22/2017 1628   MCH 33.5 03/11/2018 0944   MCHC 33.0 03/11/2018 0944   RDW 13.4 03/11/2018 0944   RDW 15.5 (H) 12/22/2017 1628   LYMPHSABS 1.7 03/11/2018 0944   LYMPHSABS 1.8 12/22/2017 1628   MONOABS 0.5 03/11/2018 0944   EOSABS 0.4 03/11/2018 0944  EOSABS 0.3 12/22/2017 1628   BASOSABS 0.1 03/11/2018 0944   BASOSABS 0.0 12/22/2017 1628    BMET    Component Value Date/Time   NA 139 03/14/2018 0409   NA 143 12/22/2017 1628   K 4.0 03/14/2018 0409   CL 104 03/14/2018 0409   CO2 28 03/14/2018 0409   GLUCOSE 102  (H) 03/14/2018 0409   BUN 16 03/14/2018 0409   BUN 10 12/22/2017 1628   CREATININE 0.99 03/14/2018 0409   CREATININE 0.97 08/26/2017 1152   CREATININE 0.97 12/04/2014 1644   CALCIUM 8.7 (L) 03/14/2018 0409   GFRNONAA >60 03/14/2018 0409   GFRNONAA >60 08/26/2017 1152   GFRAA >60 03/14/2018 0409   GFRAA >60 08/26/2017 1152    BNP    Component Value Date/Time   BNP 771.5 (H) 03/11/2018 0944    ProBNP    Component Value Date/Time   PROBNP 152.0 (H) 01/08/2017 1444    Imaging: Ct Angio Chest Pe W Or Wo Contrast  Result Date: 03/11/2018 CLINICAL DATA:  Acute hypoxic respiratory failure. EXAM: CT ANGIOGRAPHY CHEST WITH CONTRAST TECHNIQUE: Multidetector CT imaging of the chest was performed using the standard protocol during bolus administration of intravenous contrast. Multiplanar CT image reconstructions and MIPs were obtained to evaluate the vascular anatomy. CONTRAST:  164mL ISOVUE-370 IOPAMIDOL (ISOVUE-370) INJECTION 76% COMPARISON:  CT chest dated November 05, 2015. FINDINGS: Cardiovascular: Satisfactory opacification of the pulmonary arteries to the segmental level. No evidence of pulmonary embolism. Borderline cardiomegaly. Mild left atrial enlargement. No pericardial effusion. Normal caliber thoracic aorta. Coronary, aortic arch, and branch vessel atherosclerotic vascular disease. Reflux of contrast into the hepatic veins. Mediastinum/Nodes: Mildly enlarged right hilar lymph node measuring 1.2 cm in short axis, favored reactive. Subcentimeter mediastinal lymph nodes are also likely reactive. No axillary lymphadenopathy. The thyroid gland, trachea, and esophagus demonstrate no significant findings. Lungs/Pleura: Moderate central peribronchial thickening. Scattered mild interlobular septal thickening. Scattered upper lobe predominant layering dependent ground-glass densities. Focal consolidation in the right lower lobe with centrilobular nodularity. More focal centrilobular nodularity in the  anterior inferior right upper lobe and medial right middle lobe. Small bilateral pleural effusions. No pneumothorax. Upper Abdomen: No acute abnormality. Musculoskeletal: No chest wall abnormality. No acute or significant osseous findings. Review of the MIP images confirms the above findings. IMPRESSION: 1. Consolidation and extensive centrilobular nodularity within the basilar right lower lobe, favored to reflect bronchopneumonia. Additional more focal centrilobular nodularity in medial right middle lobe and anterior inferior right upper lobe also likely represent additional areas of infection. Followup PA and lateral chest X-ray is recommended in 3-4 weeks following trial of antibiotic therapy to ensure resolution. 2. Mild interstitial and alveolar pulmonary edema. Small bilateral pleural effusions. 3. No evidence of pulmonary embolism. Reflux of contrast into the hepatic veins, suggestive of right heart dysfunction. 4.  Aortic atherosclerosis (ICD10-I70.0). Electronically Signed   By: Titus Dubin M.D.   On: 03/11/2018 21:19   Dg Chest Port 1 View  Result Date: 03/11/2018 CLINICAL DATA:  Shortness of breath. Current history of CHF and COPD. Indwelling pacing defibrillator. EXAM: PORTABLE CHEST 1 VIEW COMPARISON:  12/10/2017, 11/10/2017 and earlier. FINDINGS: Cardiac silhouette upper normal in size for AP portable technique. LEFT subclavian dual lead pacing defibrillator unchanged and appears intact. Thoracic aorta atherosclerotic. Hilar and mediastinal contours otherwise unremarkable. Moderate diffuse interstitial pulmonary edema, new since the prior examinations. No visible pleural effusions. No confluent airspace consolidation. IMPRESSION: Acute CHF, with moderate diffuse interstitial pulmonary edema. Electronically Signed   By:  Evangeline Dakin M.D.   On: 03/11/2018 10:56      Assessment & Plan:   57 year old patient seen today for follow-up visit.  Patient appears clinically improved from last  week.  Hemoptysis has resolved.  Patient has completed azithromycin antibiotics and feels clinically much better.  Patient did not finish Cipro.  Patient took 1 dose and said that he started having severe abdominal pain and Crohn's attack.  Patient has not restarted this.  We will have patient follow-up in 3 to 4 weeks.  With chest x-ray.  Emphasized the importance of the patient be adherent to his BiPAP wearing this nightly as well as continuing to use 5 L of O2 at night.  Patient to bring his SD card to next appointment so we can obtain a download.  Emphasized the importance of the patient stop smoking.  Patient continued to not vape.  Patient is also not to smoke or vape marijuana or THC products.  Chronic respiratory failure with hypoxia (HCC) Continue Bipap nightly  Overnight oximetry ordered last week    Please continue to wear 5 L of oxygen at night  Please check with [facility] to figure out why you are no longer receiving coverage for your oxygen  Follow-up with our office in 3 to 4 weeks with chest x-ray before.  Follow-up with our office sooner if you have worsening symptoms, fever, coughing up blood.    Complex sleep apnea syndrome Continue BiPAP daily   We recommend that you continue using your BIPAP daily >>>Keep up the hard work using your device >>> Goal should be wearing this for the entire night that you are sleeping, at least 4 to 6 hours  Remember:  . Do not drive or operate heavy machinery if tired or drowsy.  . Please notify the supply company and office if you are unable to use your device regularly due to missing supplies or machine being broken.  . Work on maintaining a healthy weight and following your recommended nutrition plan  . Maintain proper daily exercise and movement  . Maintaining proper use of your device can also help improve management of other chronic illnesses such as: Blood pressure, blood sugars, and weight management.   BiPAP/ CPAP Cleaning:    >>>Clean weekly, with Dawn soap, and bottle brush.  Set up to air dry.    Right lower lobe pneumonia ([place]) Clinically improving today on exam after antibiotic treatment last week   Follow up in 3-4 weeks with our office with a chest xray   Hemoptysis Resolved on assessment today  Follow up with our office in 3-4 weeks with chest xray     Tobacco dependence We recommend that you stop smoking.   Smoking Cessation Resources:  1 800 QUIT NOW  >>> Patient to call this resource and utilize it to help support her quit smoking >>> Keep up your hard work with stopping smoking  You can also contact the [facility] >>>For smoking cessation classes call [phone]  We do not recommend using e-cigarettes as a form of stopping smoking  Do not inhale or "vape" marijuana or THC products.  Do not continue to smoke cigarettes.  Do not smoke marijuana.   Follow-up with our office in 3 to 4 weeks with chest x-ray before.  Follow-up with our office sooner if you have worsening symptoms, fever, coughing up blood.       Lauraine Rinne, NP 03/22/2018

## 2018-03-22 NOTE — Assessment & Plan Note (Addendum)
Clinically improving today on exam after antibiotic treatment last week   Follow up in 3-4 weeks with our office with a chest xray

## 2018-03-22 NOTE — Assessment & Plan Note (Signed)
Resolved on assessment today  Follow up with our office in 3-4 weeks with chest xray

## 2018-03-24 ENCOUNTER — Ambulatory Visit: Payer: BLUE CROSS/BLUE SHIELD | Admitting: Family Medicine

## 2018-03-24 ENCOUNTER — Telehealth: Payer: Self-pay | Admitting: Neurology

## 2018-03-24 DIAGNOSIS — R4020 Unspecified coma: Secondary | ICD-10-CM

## 2018-03-24 NOTE — Telephone Encounter (Signed)
Patient called stating he needed to see why he was scheduled for an EMG test. He stated that he thinks he's been having strokes and didn't really need the EMG test. He wanted to just see if he could skip the EMG and just do an appointment sooner than 10/2 with Dr.Jaffe about his possible strokes. Is this possible? Please call him back at [phone]. Thanks!

## 2018-03-24 NOTE — Progress Notes (Deleted)
No chief complaint on file.   HPI  4 review of systems  Past Medical History:  Diagnosis Date  . Anxiety   . Automatic implantable cardiac defibrillator [facility]    Analyze ST  . Benign prostatic hypertrophy   . Benzodiazepine dependence (HCC)    chronic  . CAD (coronary artery disease)    Last cath 2/12. 3-v CAD. Failed PCI of distal RCAc  CATH [facility] 4/13 with DES to  LAD X2  . CHF (congestive heart failure) ([place])   . Chronic back pain    lumbar stenosis  . Chronic systolic heart failure (HCC)    EF 20-25%. s/p ST. Jude ICD  . Depression   . DJD (degenerative joint disease)   . History of testicular cancer   . Narcotic dependence (HCC)    chronic  . Vitamin B12 deficiency 07/22/2016    Current Outpatient Medications  Medication Sig Dispense Refill  . albuterol (VENTOLIN HFA) 108 (90 Base) MCG/ACT inhaler Inhale 2 puffs into the lungs every 6 (six) hours as needed for wheezing or shortness of breath. 18 g 0  . alfuzosin (UROXATRAL) 10 MG 24 hr tablet Take 10 mg by mouth as needed. For UTI    . ALPRAZolam (XANAX) 1 MG tablet Take 1 mg by mouth 4 (four) times daily.     . carvedilol (COREG) 25 MG tablet TAKE 1 TABLET TWICE DAILY WITH A MEAL. (Patient taking differently: Take 25 mg by mouth 2 (two) times daily with a meal. ) 180 tablet 3  . ciprofloxacin (CIPRO) 750 MG tablet Take 1 tablet (750 mg total) by mouth 2 (two) times daily. (Patient not taking: Reported on 03/22/2018) 14 tablet 0  . clopidogrel (PLAVIX) 75 MG tablet TAKE 1 TABLET EACH DAY. (Patient taking differently: Take 75 mg by mouth daily. ) 90 tablet 0  . cyanocobalamin (,VITAMIN B-12,) 1000 MCG/ML injection Inject 1 mL (1,000 mcg total) into the muscle once a week. For 4 weeks and then once per month 10 mL 3  . diazepam (VALIUM) 10 MG tablet Take 10 mg by mouth at bedtime as needed for anxiety.   2  . ezetimibe (ZETIA) 10 MG tablet TAKE 1 TABLET EACH DAY. (Patient taking differently: Take 10 mg by mouth daily. )  90 tablet 3  . gabapentin (NEURONTIN) 300 MG capsule Take 3 capsules (900 mg total) by mouth 3 (three) times daily. 270 capsule 5  . lactulose (CHRONULAC) 10 GM/15ML solution Take 15 mLs (10 g total) by mouth 2 (two) times daily as needed for mild constipation. 1892 mL 0  . methadone (METHADOSE) 40 MG disintegrating tablet Take 200 mg by mouth daily.    . nitroGLYCERIN (NITROLINGUAL) 0.4 MG/SPRAY spray Place under the tongue.    . ondansetron (ZOFRAN-ODT) 8 MG disintegrating tablet Take 1 tablet (8 mg total) by mouth every 8 (eight) hours as needed for nausea or vomiting. 90 tablet 1  . polyethylene glycol powder (GLYCOLAX/MIRALAX) powder MIX 1 CAPFUL TWICE DAILY AS NEEDED. 250 g 0  . ranitidine (ZANTAC) 150 MG tablet Take 300 mg by mouth at bedtime.    . rosuvastatin (CRESTOR) 40 MG tablet TAKE 1 TABLET DAILY. (Patient taking differently: Take 40 mg by mouth every evening. ) 90 tablet 3  . spironolactone (ALDACTONE) 25 MG tablet TAKE (1/2) TABLET DAILY. (Patient taking differently: Take 25 mg by mouth daily. TAKE (1/2) TABLET DAILY.) 45 tablet 0  . sucralfate (CARAFATE) 1 g tablet Take 1 tablet (1 g total) by  mouth 4 (four) times daily -  with meals and at bedtime. 120 tablet 0  . tamsulosin (FLOMAX) 0.4 MG CAPS capsule Take 1 capsule (0.4 mg total) by mouth daily after breakfast. 90 capsule 0  . torsemide (DEMADEX) 20 MG tablet Take 1 tablet (20 mg total) by mouth daily as needed. For swelling in legs 90 tablet 2  . traZODone (DESYREL) 100 MG tablet Take 50 mg by mouth at bedtime as needed for sleep.     Marland Kitchen triamcinolone (NASACORT AQ) 55 MCG/ACT AERO nasal inhaler Place 1 spray into the nose daily. 1 Inhaler 12  . VIAGRA 100 MG tablet TAKE 1 TABLET APPROXIMATELY 1 HOUR BEFORE NEEDING AS DIRECTED. (Patient taking differently: Take 100 mg by mouth as needed for erectile dysfunction. ) 4 tablet 0   Current Facility-Administered Medications  Medication Dose Route Frequency Provider Last Rate Last Dose   . cyanocobalamin ((VITAMIN B-12)) injection 1,000 mcg  1,000 mcg Intramuscular Q30 days Wardell Honour, MD   1,000 mcg at 02/04/18 1455    Allergies:  Allergies  Allergen Reactions  . Darvocet [Propoxyphene N-Acetaminophen] Anaphylaxis    Throat closes  . Propoxyphene Anaphylaxis and Swelling    Past Surgical History:  Procedure Laterality Date  . ASD REPAIR, SINUS VENOSUS    . CARDIAC DEFIBRILLATOR PLACEMENT  08/2010  . HERNIA REPAIR    . LEFT HEART CATHETERIZATION WITH CORONARY ANGIOGRAM N/A 06/14/2014   Procedure: LEFT HEART CATHETERIZATION WITH CORONARY ANGIOGRAM;  Surgeon: Jolaine Artist, MD;  Location: [facility] CATH LAB;  Service: Cardiovascular;  Laterality: N/A;  . TESTICLE SURGERY     testicular cancer surgery left testicle removed    Social History   Socioeconomic History  . Marital status: Married    Spouse name: Not on file  . Number of children: 4  . Years of education: Not on file  . Highest education level: Not on file  Occupational History  . Not on file  Social Needs  . Financial resource strain: Not on file  . Food insecurity:    Worry: Not on file    Inability: Not on file  . Transportation needs:    Medical: Not on file    Non-medical: Not on file  Tobacco Use  . Smoking status: Current Every Day Smoker    Packs/day: 0.50    Years: 29.00    Pack years: 14.50    Types: Cigarettes, E-cigarettes    Last attempt to quit: 07/14/2015    Years since quitting: 2.6  . Smokeless tobacco: Never Used  . Tobacco comment: no cigarette in 4 days but yes to e-cig  Substance and Sexual Activity  . Alcohol use: No    Alcohol/week: 0.0 standard drinks  . Drug use: Yes    Types: Marijuana    Comment: Urine showed THC  . Sexual activity: Not on file  Lifestyle  . Physical activity:    Days per week: Not on file    Minutes per session: Not on file  . Stress: Not on file  Relationships  . Social connections:    Talks on phone: Not on file    Gets together:  Not on file    Attends religious service: Not on file    Active member of club or organization: Not on file    Attends meetings of clubs or organizations: Not on file    Relationship status: Not on file  Other Topics Concern  . Not on file  Social History Narrative  Marital status: married x 12 years; wife is severe alcoholic.      Children: 3 married daughters (23, 60, 40); 1 son (73yo); 3 grandchildren born in 2017      Lives: with wife, son      Left-handed   Caffeine: 3 drinks per day    Family History  Problem Relation Age of Onset  . Heart disease Father 77       Died of MI  . Cancer Mother      ROS Review of Systems See HPI Constitution: No fevers or chills No malaise No diaphoresis Skin: No rash or itching Eyes: no blurry vision, no double vision GU: no dysuria or hematuria Neuro: no dizziness or headaches * all others reviewed and negative   Objective: There were no vitals filed for this visit.  Physical Exam  Assessment and Plan There are no diagnoses linked to this encounter.   Delores P [employer]

## 2018-03-24 NOTE — Telephone Encounter (Signed)
Called and spoke with Pt. He said no one told him about a nerve test. He said he is more concerned about his arm and hand, it is starting to club and he has to wear a brace that he decided would help and bought himself. I explained to him the nerve test is for his hand and arm. I advised him to be aware of call coming from [place] also. He talked to [place] and r/s the 03/29/18 appt for EMG to Oct. 8th.

## 2018-03-25 ENCOUNTER — Ambulatory Visit: Payer: BLUE CROSS/BLUE SHIELD | Admitting: Family Medicine

## 2018-03-28 NOTE — Progress Notes (Signed)
Chart and office note reviewed in detail  > agree with a/p as outlined    

## 2018-03-29 ENCOUNTER — Encounter: Payer: Self-pay | Admitting: Neurology

## 2018-03-29 ENCOUNTER — Telehealth: Payer: Self-pay | Admitting: Neurology

## 2018-03-29 DIAGNOSIS — R404 Transient alteration of awareness: Secondary | ICD-10-CM

## 2018-03-29 DIAGNOSIS — R4020 Unspecified coma: Secondary | ICD-10-CM

## 2018-03-29 NOTE — Progress Notes (Signed)
Reviewed and agree with assessment/plan.   Asuka Dusseau, MD [facility] 07/08/2016, 12:24 PM Pager:  [phone]  

## 2018-03-29 NOTE — Telephone Encounter (Signed)
Patient called in having some questions about recent testing and appts. He wants to know if he should go to a Copy and does his Tomi Likens 10/2 appt need to be moved until after EMG testing? He wants his MRI to be ordered with the contrast. Please call him back at [phone]. Thanks!

## 2018-03-29 NOTE — Telephone Encounter (Signed)
1) Pt would like to have contrast with his CT study on Monday.  2) should he r/s his f/u appt until after EMG?  3) should he see a surgeon for his hand?

## 2018-03-30 NOTE — Telephone Encounter (Signed)
We can add with and without contrast.  I would reschedule appointment to after EMG.  I would hold off on any referrals regarding hand weakness until after EMG.

## 2018-03-30 NOTE — Telephone Encounter (Signed)
Called and advised Pt we will add contrast, we will wait until after to EMG for hand specialist as well as following up with [place]. Pt was driving and will call back tomorrow to r/s appt

## 2018-03-31 ENCOUNTER — Telehealth: Payer: Self-pay | Admitting: Pulmonary Disease

## 2018-03-31 NOTE — Telephone Encounter (Signed)
Pt returning call. Pt contact number [phone]/BMZ

## 2018-03-31 NOTE — Telephone Encounter (Signed)
Patient contacted, patient has been rescheduled for 05/02/2018 at 2:00pm. While on the phone patient voiced having some confusion about why he needed a overnight pulse oximetry. Patient voiced during visit that his insurance was no longer covering his oxygen so an over night pulse oximetry was ordered to re-qualify patient. Phoned DME Huey Romans, per [employer] his insurance will no longer pay for his cpap due to non compliance. Will cc BM as well as he was the last one to see the patient.   VS please advise.

## 2018-03-31 NOTE — Telephone Encounter (Signed)
Attempted to call patient and pt's daughter Threasa Alpha today regarding ov with VS tomorrow. Pt seen B Mack on 03-22-18 and is suppose to have f/u with VS within 3-4 weeks. Need to cancel tomorrow's appt and reschedule to 3-4 weeks out with VS. I did not receive an answer at time of call. I have left a voicemail message for pt to return call. X1

## 2018-04-01 ENCOUNTER — Ambulatory Visit: Payer: Self-pay | Admitting: Pulmonary Disease

## 2018-04-01 NOTE — Telephone Encounter (Signed)
He needs to keep his appt on 05/02/18 and will discuss in more detail then.  Can cancel overnight oximetry for now.

## 2018-04-01 NOTE — Telephone Encounter (Signed)
ATC-mailbox is full at this time-will need to try again later today.

## 2018-04-03 NOTE — Telephone Encounter (Signed)
Agree with VS. Please let me know if I can help in anyway. Thanks for working on this.   Wyn Quaker FNP

## 2018-04-04 ENCOUNTER — Telehealth: Payer: Self-pay | Admitting: Neurology

## 2018-04-04 ENCOUNTER — Inpatient Hospital Stay: Admission: RE | Admit: 2018-04-04 | Payer: Self-pay | Source: Ambulatory Visit

## 2018-04-04 NOTE — Telephone Encounter (Signed)
[facility] called to let Dr. Tomi Likens know that this patient missed his appointment for his Head CT. Thanks

## 2018-04-11 ENCOUNTER — Telehealth: Payer: Self-pay | Admitting: *Deleted

## 2018-04-11 NOTE — Telephone Encounter (Signed)
Called patient and cancelled appointment for 04/12/2018 with Wyn Quaker NP per skype message "can we call [name] and cancel that appt tomorrow or does he still need it"   Patient said it was fine and if he didn't feel good he would call back tomorrow.   Nothing further needed

## 2018-04-12 ENCOUNTER — Other Ambulatory Visit (HOSPITAL_COMMUNITY): Payer: Self-pay | Admitting: Internal Medicine

## 2018-04-12 ENCOUNTER — Ambulatory Visit: Payer: Self-pay | Admitting: Pulmonary Disease

## 2018-04-12 ENCOUNTER — Telehealth: Payer: Self-pay | Admitting: Neurology

## 2018-04-12 ENCOUNTER — Other Ambulatory Visit: Payer: Self-pay

## 2018-04-12 DIAGNOSIS — G4733 Obstructive sleep apnea (adult) (pediatric): Secondary | ICD-10-CM | POA: Diagnosis not present

## 2018-04-12 DIAGNOSIS — G4737 Central sleep apnea in conditions classified elsewhere: Secondary | ICD-10-CM | POA: Diagnosis not present

## 2018-04-12 NOTE — Telephone Encounter (Signed)
Patient has been called to reschedule.

## 2018-04-12 NOTE — Telephone Encounter (Signed)
He needs to have 2 tests performed prior to follow up: 1.  CT head 2.  NCV-EMG

## 2018-04-12 NOTE — Telephone Encounter (Signed)
Patient is calling in wanting to find out if he needs to come in for his appt tomorrow because he hasn't had a CT Scan yet at [facility]. That is something that they are trying to do today. His number is called at [phone]. Thanks!

## 2018-04-12 NOTE — Telephone Encounter (Signed)
Please advise 

## 2018-04-12 NOTE — Progress Notes (Deleted)
NEUROLOGY FOLLOW UP OFFICE NOTE  [name] [id number]  HISTORY OF PRESENT ILLNESS: [name] is a 57 year old left-handed male with chronic systolic heart failure s/p AICD, CAD, depression, narcotic and benzodiazepine dependce, chronic pain and history of testicular cancer whom I previously saw memory problems and more recently for lumbar radiculopathy who follows up for left hand weakness and numbness.  UPDATE: ***  HISTORY: In early August, he fell asleep at the dinner table while watching TV.  When he woke up, he could not move his left hand.  His wrist was limp.  There was some numbness in his thumb.  There was no neck pain or pain radiating down the arm.  He has history of confusion and memory deficits, so it is not quite clear what happened.  However, he said that his son found him on the floor unresponsive.  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  He declined going to the ED because he didn't have anyone to take care of his son.  He still has left hand weakness but numbness in the thumb resolved.  He is concerned because he reports history of multiple TIAs.  PAST MEDICAL HISTORY: Past Medical History:  Diagnosis Date  . Anxiety   . Automatic implantable cardiac defibrillator [facility]    Analyze ST  . Benign prostatic hypertrophy   . Benzodiazepine dependence (HCC)    chronic  . CAD (coronary artery disease)    Last cath 2/12. 3-v CAD. Failed PCI of distal RCAc  CATH [facility] 4/13 with DES to  LAD X2  . CHF (congestive heart failure) ([place])   . Chronic back pain    lumbar stenosis  . Chronic systolic heart failure (HCC)    EF 20-25%. s/p ST. Jude ICD  . Depression   . DJD (degenerative joint disease)   . History of testicular cancer   . Narcotic dependence (HCC)    chronic  . Vitamin B12 deficiency 07/22/2016    MEDICATIONS: Current Outpatient Medications on File Prior to Visit  Medication Sig Dispense Refill  .  albuterol (VENTOLIN HFA) 108 (90 Base) MCG/ACT inhaler Inhale 2 puffs into the lungs every 6 (six) hours as needed for wheezing or shortness of breath. 18 g 0  . alfuzosin (UROXATRAL) 10 MG 24 hr tablet Take 10 mg by mouth as needed. For UTI    . ALPRAZolam (XANAX) 1 MG tablet Take 1 mg by mouth 4 (four) times daily.     . carvedilol (COREG) 25 MG tablet TAKE 1 TABLET TWICE DAILY WITH A MEAL. (Patient taking differently: Take 25 mg by mouth 2 (two) times daily with a meal. ) 180 tablet 3  . ciprofloxacin (CIPRO) 750 MG tablet Take 1 tablet (750 mg total) by mouth 2 (two) times daily. (Patient not taking: Reported on 03/22/2018) 14 tablet 0  . clopidogrel (PLAVIX) 75 MG tablet TAKE 1 TABLET EACH DAY. (Patient taking differently: Take 75 mg by mouth daily. ) 90 tablet 0  . cyanocobalamin (,VITAMIN B-12,) 1000 MCG/ML injection Inject 1 mL (1,000 mcg total) into the muscle once a week. For 4 weeks and then once per month 10 mL 3  . diazepam (VALIUM) 10 MG tablet Take 10 mg by mouth at bedtime as needed for anxiety.   2  . ezetimibe (ZETIA) 10 MG tablet TAKE 1 TABLET EACH DAY. (Patient taking differently: Take 10 mg by mouth daily. ) 90  tablet 3  . gabapentin (NEURONTIN) 300 MG capsule Take 3 capsules (900 mg total) by mouth 3 (three) times daily. 270 capsule 5  . lactulose (CHRONULAC) 10 GM/15ML solution Take 15 mLs (10 g total) by mouth 2 (two) times daily as needed for mild constipation. 1892 mL 0  . methadone (METHADOSE) 40 MG disintegrating tablet Take 200 mg by mouth daily.    . nitroGLYCERIN (NITROLINGUAL) 0.4 MG/SPRAY spray Place under the tongue.    . ondansetron (ZOFRAN-ODT) 8 MG disintegrating tablet Take 1 tablet (8 mg total) by mouth every 8 (eight) hours as needed for nausea or vomiting. 90 tablet 1  . polyethylene glycol powder (GLYCOLAX/MIRALAX) powder MIX 1 CAPFUL TWICE DAILY AS NEEDED. 250 g 0  . ranitidine (ZANTAC) 150 MG tablet Take 300 mg by mouth at bedtime.    . rosuvastatin  (CRESTOR) 40 MG tablet TAKE 1 TABLET DAILY. (Patient taking differently: Take 40 mg by mouth every evening. ) 90 tablet 3  . spironolactone (ALDACTONE) 25 MG tablet TAKE (1/2) TABLET DAILY. (Patient taking differently: Take 25 mg by mouth daily. TAKE (1/2) TABLET DAILY.) 45 tablet 0  . sucralfate (CARAFATE) 1 g tablet Take 1 tablet (1 g total) by mouth 4 (four) times daily -  with meals and at bedtime. 120 tablet 0  . tamsulosin (FLOMAX) 0.4 MG CAPS capsule Take 1 capsule (0.4 mg total) by mouth daily after breakfast. 90 capsule 0  . torsemide (DEMADEX) 20 MG tablet Take 1 tablet (20 mg total) by mouth daily as needed. For swelling in legs 90 tablet 2  . traZODone (DESYREL) 100 MG tablet Take 50 mg by mouth at bedtime as needed for sleep.     Marland Kitchen triamcinolone (NASACORT AQ) 55 MCG/ACT AERO nasal inhaler Place 1 spray into the nose daily. 1 Inhaler 12  . VIAGRA 100 MG tablet TAKE 1 TABLET APPROXIMATELY 1 HOUR BEFORE NEEDING AS DIRECTED. (Patient taking differently: Take 100 mg by mouth as needed for erectile dysfunction. ) 4 tablet 0   Current Facility-Administered Medications on File Prior to Visit  Medication Dose Route Frequency Provider Last Rate Last Dose  . cyanocobalamin ((VITAMIN B-12)) injection 1,000 mcg  1,000 mcg Intramuscular Q30 days Wardell Honour, MD   1,000 mcg at 02/04/18 1455    ALLERGIES: Allergies  Allergen Reactions  . Darvocet [Propoxyphene N-Acetaminophen] Anaphylaxis    Throat closes  . Propoxyphene Anaphylaxis and Swelling    FAMILY HISTORY: Family History  Problem Relation Age of Onset  . Heart disease Father 34       Died of MI  . Cancer Mother    ***.  SOCIAL HISTORY: Social History   Socioeconomic History  . Marital status: Married    Spouse name: Not on file  . Number of children: 4  . Years of education: Not on file  . Highest education level: Not on file  Occupational History  . Not on file  Social Needs  . Financial resource strain: Not on  file  . Food insecurity:    Worry: Not on file    Inability: Not on file  . Transportation needs:    Medical: Not on file    Non-medical: Not on file  Tobacco Use  . Smoking status: Current Every Day Smoker    Packs/day: 0.50    Years: 29.00    Pack years: 14.50    Types: Cigarettes, E-cigarettes    Last attempt to quit: 07/14/2015    Years since quitting: 2.7  .  Smokeless tobacco: Never Used  . Tobacco comment: no cigarette in 4 days but yes to e-cig  Substance and Sexual Activity  . Alcohol use: No    Alcohol/week: 0.0 standard drinks  . Drug use: Yes    Types: Marijuana    Comment: Urine showed THC  . Sexual activity: Not on file  Lifestyle  . Physical activity:    Days per week: Not on file    Minutes per session: Not on file  . Stress: Not on file  Relationships  . Social connections:    Talks on phone: Not on file    Gets together: Not on file    Attends religious service: Not on file    Active member of club or organization: Not on file    Attends meetings of clubs or organizations: Not on file    Relationship status: Not on file  . Intimate partner violence:    Fear of current or ex partner: Not on file    Emotionally abused: Not on file    Physically abused: Not on file    Forced sexual activity: Not on file  Other Topics Concern  . Not on file  Social History Narrative   Marital status: married x 12 years; wife is severe alcoholic.      Children: 3 married daughters (23, 58, 31); 1 son (2yo); 3 grandchildren born in 2017      Lives: with wife, son      Left-handed   Caffeine: 3 drinks per day    REVIEW OF SYSTEMS: Constitutional: No fevers, chills, or sweats, no generalized fatigue, change in appetite Eyes: No visual changes, double vision, eye pain Ear, nose and throat: No hearing loss, ear pain, nasal congestion, sore throat Cardiovascular: No chest pain, palpitations Respiratory:  No shortness of breath at rest or with exertion,  wheezes GastrointestinaI: No nausea, vomiting, diarrhea, abdominal pain, fecal incontinence Genitourinary:  No dysuria, urinary retention or frequency Musculoskeletal:  No neck pain, back pain Integumentary: No rash, pruritus, skin lesions Neurological: as above Psychiatric: No depression, insomnia, anxiety Endocrine: No palpitations, fatigue, diaphoresis, mood swings, change in appetite, change in weight, increased thirst Hematologic/Lymphatic:  No purpura, petechiae. Allergic/Immunologic: no itchy/runny eyes, nasal congestion, recent allergic reactions, rashes  PHYSICAL EXAM: *** General: No acute distress.  Patient appears ***-groomed.  *** body habitus. Head:  Normocephalic/atraumatic Eyes:  Fundi examined but not visualized Neck: supple, no paraspinal tenderness, full range of motion Heart:  Regular rate and rhythm Lungs:  Clear to auscultation bilaterally Back: No paraspinal tenderness Neurological Exam: alert and oriented to person, place, and time. Attention span and concentration intact, recent and remote memory intact, fund of knowledge intact.  Speech fluent and not dysarthric, language intact.  CN II-XII intact. Bulk and tone normal, muscle strength 5/5 throughout.  Sensation to light touch, temperature and vibration intact.  Deep tendon reflexes 2+ throughout, toes downgoing.  Finger to nose and heel to shin testing intact.  Gait normal, Romberg negative.  IMPRESSION: ***  PLAN: ***  Metta Clines, DO  CC: ***

## 2018-04-13 ENCOUNTER — Ambulatory Visit: Payer: Self-pay | Admitting: Neurology

## 2018-04-14 ENCOUNTER — Other Ambulatory Visit (HOSPITAL_COMMUNITY): Payer: Self-pay | Admitting: Internal Medicine

## 2018-04-18 ENCOUNTER — Ambulatory Visit: Payer: Self-pay | Admitting: Pulmonary Disease

## 2018-04-18 ENCOUNTER — Ambulatory Visit
Admission: RE | Admit: 2018-04-18 | Discharge: 2018-04-18 | Disposition: A | Discharge status: Home / Self Care | Payer: BLUE CROSS/BLUE SHIELD | Source: Ambulatory Visit | Attending: Neurology | Admitting: Neurology

## 2018-04-18 DIAGNOSIS — R404 Transient alteration of awareness: Secondary | ICD-10-CM

## 2018-04-18 DIAGNOSIS — R410 Disorientation, unspecified: Secondary | ICD-10-CM | POA: Diagnosis not present

## 2018-04-18 DIAGNOSIS — R4020 Unspecified coma: Secondary | ICD-10-CM

## 2018-04-18 DIAGNOSIS — R51 Headache: Secondary | ICD-10-CM | POA: Diagnosis not present

## 2018-04-18 DIAGNOSIS — S069X9A Unspecified intracranial injury with loss of consciousness of unspecified duration, initial encounter: Secondary | ICD-10-CM | POA: Diagnosis not present

## 2018-04-18 MED ORDER — IOPAMIDOL (ISOVUE-300) INJECTION 61%
75.0000 mL | Freq: Once | INTRAVENOUS | Status: AC | PRN
  Administered 2018-04-18: 75 mL via INTRAVENOUS

## 2018-04-19 ENCOUNTER — Ambulatory Visit (INDEPENDENT_AMBULATORY_CARE_PROVIDER_SITE_OTHER): Payer: BLUE CROSS/BLUE SHIELD | Admitting: Neurology

## 2018-04-19 ENCOUNTER — Telehealth: Payer: Self-pay | Admitting: *Deleted

## 2018-04-19 DIAGNOSIS — M5416 Radiculopathy, lumbar region: Secondary | ICD-10-CM | POA: Diagnosis not present

## 2018-04-19 DIAGNOSIS — M5410 Radiculopathy, site unspecified: Secondary | ICD-10-CM

## 2018-04-19 NOTE — Telephone Encounter (Signed)
-----   Message from Pieter Partridge, DO sent at 04/19/2018 12:42 PM EDT ----- CT of head looks normal

## 2018-04-19 NOTE — Telephone Encounter (Signed)
Patient given results

## 2018-04-19 NOTE — Procedures (Signed)
[facility] Neurology  [place], [place]  [place], [place] [address] Tel: [phone] Fax:  [phone] Test Date:  04/19/2018  Patient: [name] DOB: [birth year] Physician: Narda Amber, DO  Sex: Male Height: 5' 10"  Ref Phys: Metta Clines, DO  ID#: [id number] Temp: 33.0C Technician:    Patient Complaints: This is a 57 year old man referred for evaluation of left wrist drop.  NCV & EMG Findings: Extensive electrodiagnostic testing of the left upper extremity and additional studies of the right shows:  1. Left ulnar sensory response shows mildly prolonged latency (3.2 ms).  . Left radial, median, medial and lateral antebrachial cutaneous sensory responses are within normal limits. 2. Left ulnar motor response shows prolonged distal onset latency (3.4 ms) and decreased conduction velocity (A Elbow-B Elbow, 40 m/s).  Left median and radial motor responses are within normal limits.   3. Severe active on chronic motor axonal loss changes are seen affecting all the radial-innervated muscles, sparing the triceps muscle.  Additionally, there is active on chronic changes in the left deltoid and infraspinatus muscles.  Fibrillation potentials are not present in the pronator teres or cervical paraspinal muscles.  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    A brachial plexopathy is less likely in the setting of essentially normal sensory responses.   ___________________________ Narda Amber, DO    Nerve Conduction Studies Anti Sensory Summary Table   Site NR Peak (ms) Norm Peak (ms) P-T Amp (V) Norm P-T Amp  Left Lat Ante Brach Cutan Anti Sensory (Lat Forearm)  33C  Lat Biceps    2.5 <2.9 10.8   Left Med Ante Brach Cutan Anti Sensory (Med Forearm)   33C  Elbow    4.8  7.0   Left Median Anti Sensory (2nd Digit)  33C  Wrist    3.5 <3.6 20.2 >15  Left Radial Anti Sensory (Base 1st Digit)  33C  Wrist    2.6 <2.7 14.2 >14  Left Ulnar Anti Sensory (5th Digit)  33C  Wrist    3.2 <3.1 17.3 >10   Motor Summary Table   Site NR Onset (ms) Norm Onset (ms) O-P Amp (mV) Norm O-P Amp Site1 Site2 Delta-0 (ms) Dist (cm) Vel (m/s) Norm Vel (m/s)  Left Median Motor (Abd Poll Brev)  33C  Wrist    3.8 <4.0 12.2 >6 Elbow Wrist 6.2 32.0 52 >50  Elbow    10.0  11.9         Left Radial Motor (Ext Ind Prop)  33C  7cm    2.9 <3.1 8.0 >5 7cm ACF 2.3 14.0 61 >50  ACF    5.2  7.5  ACF Below-el6ow 1.6 10.0 63   Below-elbow    6.8  7.2  Below-el6ow A6ove-el6ow 0.6 4.0 67   Above-elbow    7.4  7.1         Left Ulnar Motor (Abd Dig Minimi)  33C  Wrist    3.4 <3.1 9.4 >7 B Elbow Wrist 4.6 25.0 54 >50  B Elbow    8.0  8.9  A Elbow B Elbow 2.5 10.0 40 >50  A Elbow    10.5  8.6          EMG   Side Muscle Ins Act Fibs Psw Fasc Number Recrt Dur Dur. Amp Amp.  Poly Poly. Comment  Left Ext Digitorum Nml 1+ Nml Nml NE None - - - - - - N/A  Left Ext Indicis Nml 1+ Nml Nml [facility] Rapid All 1+ All 1+ All 1+ N/A  Left Triceps Nml Nml Nml Nml 2- Rapid Few 1+ Few 1+ Nml Nml N/A  Left Infraspinatus Nml 1+ Nml Nml 3- Rapid Many 1+ Many 1+ Some 1+ N/A  Left BrachioRad Nml 1+ Nml Nml 3- Rapid Many 1+ Many 1+ Nml Nml N/A  Left Deltoid Nml 2+ Nml Nml [facility] Rapid Many 1+ Many 1+ Some 1+ N/A  Left ExtCarRadLong Nml 1+ Nml Nml 3- Rapid Most 1+ Most 1+ Nml Nml N/A  Right Deltoid Nml Nml Nml Nml Nml Nml Nml Nml Nml Nml Nml Nml N/A  Left PronatorTeres Nml Nml Nml Nml Few 1+ Few 1+ Few 1+ Nml Nml N/A  Right Ext Indicis Nml Nml Nml Nml Nml Nml Nml Nml Nml Nml Nml Nml N/A  Left FlexCarpiUln Nml Nml Nml Nml Nml Nml Nml Nml Nml Nml Nml Nml N/A  Left CervicalPSP Nml Nml Nml Nml Nml Nml Nml Nml Nml Nml Nml Nml N/A  Left 1stDorInt Nml Nml Nml Nml Nml Nml Nml Nml Nml Nml Nml Nml N/A  Left  Biceps Nml Nml Nml Nml 2- Rapid Some 1+ Some 1+ Nml Nml N/A      Waveforms:

## 2018-04-26 NOTE — Progress Notes (Deleted)
NEUROLOGY FOLLOW UP OFFICE NOTE  [name] [id number]  HISTORY OF PRESENT ILLNESS: [name] is a 57 year old left-handed male with chronic systolic heart failure status post AICD, coronary artery disease, depression, narcotic and benzodiazepine dependence, chronic pain and history of testicular cancer who follows up for left hand weakness and numbness and episode of loss of consciousness.  UPDATE: He underwent workup for left wrist drop.   CT Head w/wo Contrast from 04/18/18 was personally reviewed and was unremarkable. 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  Sensory responses were normal.  A superimposed ulnar neuropathy across the elbow also was noted.  HISTORY:  In August, he fell asleep at the dinner table while watching TV.  When he woke up, he could not move his left hand.  His wrist was limp.  There was some numbness in his thumb.  There was no neck pain or pain radiating down the arm.  He has history of confusion, transient loss of awareness and memory deficits, so it is not quite clear what happened.  However, he said that his son found him on the floor unresponsive.  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  He declined going to the ED because he didn't have anyone to take care of his son.  He still has left hand weakness but numbness in the thumb resolved.  He is concerned because he reports history of multiple TIAs.  OTHER HISTORY: For several years, he has had transient episodes of staring spells.  He seems to "zone out" and has difficulty talking.  He may just repeat his wife's name.  Usually, he is conscious during these events but there were some episodes where he had no recollection.  It lasts about 10 to 15 minutes.  There are no convulsions, tongue biting, incontinence or  postictal confusion.  It occurs once every 6 weeks.  He has had MRI of the brain with and without contrast performed on 04/06/08, which reportedly showed small vessel disease but no acute findings.  Carotid doppler was negative.  He reports one possible seizure that may have been provoked when using cocaine back in college.  EEG from 2018 was normal.  Otherwise, no history of seizures.  No history of stroke.  No known family history of seizures.  He also reports history of memory problems.  Specifically, he has trouble remembering events or conversations that occurred just two days ago.  He is independent cognitively in performing ADLs.  He manages his finances.  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  He does not drink alcohol.  He uses marijuana.  He has a JD but never practiced law.  His father passed away at age 69.  His mother did not have dementia. When I saw him in July 2017, he was found to have low B12 of 166 and was started on supplementation.  He did not return for follow up. CT of head from 03/18/16 showed no acute abnormality.  CT of cervical spine showed degenerative changes.  He has longstanding history of narcotic and benzodiazepine use.  CT of head from 08/10/13 was personally reviewed and is unremarkable.  PAST MEDICAL HISTORY: Past Medical History:  Diagnosis Date  . Anxiety   . Automatic implantable cardiac defibrillator [employer]    Analyze ST  .  Benign prostatic hypertrophy   . Benzodiazepine dependence (HCC)    chronic  . CAD (coronary artery disease)    Last cath 2/12. 3-v CAD. Failed PCI of distal RCAc  CATH [facility] 4/13 with DES to  LAD X2  . CHF (congestive heart failure) ([place])   . Chronic back pain    lumbar stenosis  . Chronic systolic heart failure (HCC)    EF 20-25%. s/p ST. Jude ICD  . Depression   . DJD (degenerative joint disease)   . History of testicular cancer   . Narcotic dependence (HCC)    chronic  . Vitamin B12 deficiency 07/22/2016     MEDICATIONS: Current Outpatient Medications on File Prior to Visit  Medication Sig Dispense Refill  . albuterol (VENTOLIN HFA) 108 (90 Base) MCG/ACT inhaler Inhale 2 puffs into the lungs every 6 (six) hours as needed for wheezing or shortness of breath. 18 g 0  . alfuzosin (UROXATRAL) 10 MG 24 hr tablet Take 10 mg by mouth as needed. For UTI    . ALPRAZolam (XANAX) 1 MG tablet Take 1 mg by mouth 4 (four) times daily.     . carvedilol (COREG) 25 MG tablet TAKE 1 TABLET BY MOUTH TWICE DAILY WITH A MEAL. 180 tablet 0  . ciprofloxacin (CIPRO) 750 MG tablet Take 1 tablet (750 mg total) by mouth 2 (two) times daily. (Patient not taking: Reported on 03/22/2018) 14 tablet 0  . clopidogrel (PLAVIX) 75 MG tablet Take 1 tablet (75 mg total) by mouth daily. Pt needs to schedule an appt with our office at [phone] for future refills. 30 tablet 2  . cyanocobalamin (,VITAMIN B-12,) 1000 MCG/ML injection Inject 1 mL (1,000 mcg total) into the muscle once a week. For 4 weeks and then once per month 10 mL 3  . diazepam (VALIUM) 10 MG tablet Take 10 mg by mouth at bedtime as needed for anxiety.   2  . ezetimibe (ZETIA) 10 MG tablet Take 1 tablet (10 mg total) by mouth daily. 30 tablet 2  . gabapentin (NEURONTIN) 300 MG capsule Take 3 capsules (900 mg total) by mouth 3 (three) times daily. 270 capsule 5  . lactulose (CHRONULAC) 10 GM/15ML solution Take 15 mLs (10 g total) by mouth 2 (two) times daily as needed for mild constipation. 1892 mL 0  . methadone (METHADOSE) 40 MG disintegrating tablet Take 200 mg by mouth daily.    . nitroGLYCERIN (NITROLINGUAL) 0.4 MG/SPRAY spray Place under the tongue.    . ondansetron (ZOFRAN-ODT) 8 MG disintegrating tablet Take 1 tablet (8 mg total) by mouth every 8 (eight) hours as needed for nausea or vomiting. 90 tablet 1  . polyethylene glycol powder (GLYCOLAX/MIRALAX) powder MIX 1 CAPFUL TWICE DAILY AS NEEDED. 250 g 0  . ranitidine (ZANTAC) 150 MG tablet Take 300 mg by  mouth at bedtime.    . rosuvastatin (CRESTOR) 40 MG tablet Take 1 tablet (40 mg total) by mouth every evening. 30 tablet 2  . spironolactone (ALDACTONE) 25 MG tablet Take 1 tablet (25 mg total) by mouth daily. TAKE (1/2) TABLET DAILY. 15 tablet 2  . sucralfate (CARAFATE) 1 g tablet Take 1 tablet (1 g total) by mouth 4 (four) times daily -  with meals and at bedtime. 120 tablet 0  . tamsulosin (FLOMAX) 0.4 MG CAPS capsule Take 1 capsule (0.4 mg total) by mouth daily after breakfast. 90 capsule 0  . torsemide (DEMADEX) 20 MG tablet Take 1 tablet (20 mg total) by mouth daily  as needed. For swelling in legs 90 tablet 2  . traZODone (DESYREL) 100 MG tablet Take 50 mg by mouth at bedtime as needed for sleep.     Marland Kitchen triamcinolone (NASACORT AQ) 55 MCG/ACT AERO nasal inhaler Place 1 spray into the nose daily. 1 Inhaler 12  . VIAGRA 100 MG tablet TAKE 1 TABLET APPROXIMATELY 1 HOUR BEFORE NEEDING AS DIRECTED. (Patient taking differently: Take 100 mg by mouth as needed for erectile dysfunction. ) 4 tablet 0   Current Facility-Administered Medications on File Prior to Visit  Medication Dose Route Frequency Provider Last Rate Last Dose  . cyanocobalamin ((VITAMIN B-12)) injection 1,000 mcg  1,000 mcg Intramuscular Q30 days Wardell Honour, MD   1,000 mcg at 02/04/18 1455    ALLERGIES: Allergies  Allergen Reactions  . Darvocet [Propoxyphene N-Acetaminophen] Anaphylaxis    Throat closes  . Propoxyphene Anaphylaxis and Swelling    FAMILY HISTORY: Family History  Problem Relation Age of Onset  . Heart disease Father 15       Died of MI  . Cancer Mother    ***.  SOCIAL HISTORY: Social History   Socioeconomic History  . Marital status: Married    Spouse name: Not on file  . Number of children: 4  . Years of education: Not on file  . Highest education level: Not on file  Occupational History  . Not on file  Social Needs  . Financial resource strain: Not on file  . Food insecurity:    Worry:  Not on file    Inability: Not on file  . Transportation needs:    Medical: Not on file    Non-medical: Not on file  Tobacco Use  . Smoking status: Current Every Day Smoker    Packs/day: 0.50    Years: 29.00    Pack years: 14.50    Types: Cigarettes, E-cigarettes    Last attempt to quit: 07/14/2015    Years since quitting: 2.7  . Smokeless tobacco: Never Used  . Tobacco comment: no cigarette in 4 days but yes to e-cig  Substance and Sexual Activity  . Alcohol use: No    Alcohol/week: 0.0 standard drinks  . Drug use: Yes    Types: Marijuana    Comment: Urine showed THC  . Sexual activity: Not on file  Lifestyle  . Physical activity:    Days per week: Not on file    Minutes per session: Not on file  . Stress: Not on file  Relationships  . Social connections:    Talks on phone: Not on file    Gets together: Not on file    Attends religious service: Not on file    Active member of club or organization: Not on file    Attends meetings of clubs or organizations: Not on file    Relationship status: Not on file  . Intimate partner violence:    Fear of current or ex partner: Not on file    Emotionally abused: Not on file    Physically abused: Not on file    Forced sexual activity: Not on file  Other Topics Concern  . Not on file  Social History Narrative   Marital status: married x 12 years; wife is severe alcoholic.      Children: 3 married daughters (23, 24, 28); 1 son (48yo); 3 grandchildren born in 2017      Lives: with wife, son      Left-handed   Caffeine: 3 drinks per day  REVIEW OF SYSTEMS: Constitutional: No fevers, chills, or sweats, no generalized fatigue, change in appetite Eyes: No visual changes, double vision, eye pain Ear, nose and throat: No hearing loss, ear pain, nasal congestion, sore throat Cardiovascular: No chest pain, palpitations Respiratory:  No shortness of breath at rest or with exertion, wheezes GastrointestinaI: No nausea, vomiting,  diarrhea, abdominal pain, fecal incontinence Genitourinary:  No dysuria, urinary retention or frequency Musculoskeletal:  No neck pain, back pain Integumentary: No rash, pruritus, skin lesions Neurological: as above Psychiatric: No depression, insomnia, anxiety Endocrine: No palpitations, fatigue, diaphoresis, mood swings, change in appetite, change in weight, increased thirst Hematologic/Lymphatic:  No purpura, petechiae. Allergic/Immunologic: no itchy/runny eyes, nasal congestion, recent allergic reactions, rashes  PHYSICAL EXAM: *** General: No acute distress.  Patient appears ***-groomed.  *** body habitus. Head:  Normocephalic/atraumatic Eyes:  Fundi examined but not visualized Neck: supple, no paraspinal tenderness, full range of motion Heart:  Regular rate and rhythm Lungs:  Clear to auscultation bilaterally Back: No paraspinal tenderness Neurological Exam: alert and oriented to person, place, and time. Attention span and concentration intact, recent and remote memory intact, fund of knowledge intact.  Speech fluent and not dysarthric, language intact.  CN II-XII intact. Bulk and tone normal, muscle strength 5/5 throughout.  Sensation to light touch, temperature and vibration intact.  Deep tendon reflexes 2+ throughout, toes downgoing.  Finger to nose and heel to shin testing intact.  Gait normal, Romberg negative.  IMPRESSION: 1.  Left upper extremity weakness.  NCV-EMG consistent with a polyradiculopathy.  Brachial plexopathy less likely given normal sensory responses.  Imaging of the cervical spine is recommended.  Unfortunately, MRI is not an option as he has an AICD.  Options would be CT cervical spine with and without contrast to look for any obvious stenosis.  CT myelogram would provide more information.   PLAN: 1.  After discussion, ***.  If unremarkable, will likely need an LP for CSF analysis.  Metta Clines, DO  CC: ***

## 2018-04-27 ENCOUNTER — Encounter: Payer: Self-pay | Admitting: Neurology

## 2018-04-27 ENCOUNTER — Ambulatory Visit: Payer: Self-pay | Admitting: Neurology

## 2018-04-27 ENCOUNTER — Encounter

## 2018-04-27 DIAGNOSIS — Z029 Encounter for administrative examinations, unspecified: Secondary | ICD-10-CM

## 2018-04-28 ENCOUNTER — Encounter: Payer: Self-pay | Admitting: Neurology

## 2018-05-02 ENCOUNTER — Telehealth: Payer: Self-pay | Admitting: Neurology

## 2018-05-02 ENCOUNTER — Ambulatory Visit: Payer: Self-pay | Admitting: Pulmonary Disease

## 2018-05-02 DIAGNOSIS — F4322 Adjustment disorder with anxiety: Secondary | ICD-10-CM | POA: Diagnosis not present

## 2018-05-02 NOTE — Telephone Encounter (Signed)
Called patient on 04/28/18 at 9:35am in a effort to r/s his appointment that he no-showed for on 04/27/18. Called cell phone # at [phone] which is also listed as his home #. No answer, unable to leave voice mail message due to mail box being full. Called the phone number listed as "other" in the patients chart [phone]) no answer and voice mail box was full unable to leave message. Will try and reach patient again on Monday to get him r/s with Dr.Jaffe so that we can give him his test results.
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
## 2018-05-03 NOTE — Progress Notes (Deleted)
NEUROLOGY FOLLOW UP OFFICE NOTE  [name] [id number]  HISTORY OF PRESENT ILLNESS: [name] is a 57 year old left-handed male with chronic systolic heart failure status post AICD, coronary artery disease, depression, narcotic and benzodiazepine dependence, chronic pain and history of testicular cancer who follows up for left hand weakness and numbness and episode of loss of consciousness.  UPDATE: He underwent workup for left wrist drop.   CT Head w/wo Contrast from 04/18/18 was personally reviewed and was unremarkable. 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  Sensory responses were normal.  A superimposed ulnar neuropathy across the elbow also was noted.  HISTORY:  In August, he fell asleep at the dinner table while watching TV.  When he woke up, he could not move his left hand.  His wrist was limp.  There was some numbness in his thumb.  There was no neck pain or pain radiating down the arm.  He has history of confusion, transient loss of awareness and memory deficits, so it is not quite clear what happened.  However, he said that his son found him on the floor unresponsive.  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  He declined going to the ED because he didn't have anyone to take care of his son.  He still has left hand weakness but numbness in the thumb resolved.  He is concerned because he reports history of multiple TIAs.  OTHER HISTORY: For several years, he has had transient episodes of staring spells.  He seems to "zone out" and has difficulty talking.  He may just repeat his wife's name.  Usually, he is conscious during these events but there were some episodes where he had no recollection.  It lasts about 10 to 15 minutes.  There are no convulsions, tongue biting, incontinence or  postictal confusion.  It occurs once every 6 weeks.  He has had MRI of the brain with and without contrast performed on 04/06/08, which reportedly showed small vessel disease but no acute findings.  Carotid doppler was negative.  He reports one possible seizure that may have been provoked when using cocaine back in college.  EEG from 2018 was normal.  Otherwise, no history of seizures.  No history of stroke.  No known family history of seizures.  He also reports history of memory problems.  Specifically, he has trouble remembering events or conversations that occurred just two days ago.  He is independent cognitively in performing ADLs.  He manages his finances.  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  He does not drink alcohol.  He uses marijuana.  He has a JD but never practiced law.  His father passed away at age 69.  His mother did not have dementia. When I saw him in July 2017, he was found to have low B12 of 166 and was started on supplementation.  He did not return for follow up. CT of head from 03/18/16 showed no acute abnormality.  CT of cervical spine showed degenerative changes.  He has longstanding history of narcotic and benzodiazepine use.  CT of head from 08/10/13 was personally reviewed and is unremarkable.  PAST MEDICAL HISTORY: Past Medical History:  Diagnosis Date  . Anxiety   . Automatic implantable cardiac defibrillator [employer]    Analyze ST  .  Benign prostatic hypertrophy   . Benzodiazepine dependence (HCC)    chronic  . CAD (coronary artery disease)    Last cath 2/12. 3-v CAD. Failed PCI of distal RCAc  CATH [facility] 4/13 with DES to  LAD X2  . CHF (congestive heart failure) ([place])   . Chronic back pain    lumbar stenosis  . Chronic systolic heart failure (HCC)    EF 20-25%. s/p ST[employer] Jude ICD  . Depression   . DJD (degenerative joint disease)   . History of testicular cancer   . Narcotic dependence (HCC)    chronic  . Vitamin B12 deficiency 07/22/2016     MEDICATIONS: Current Outpatient Medications on File Prior to Visit  Medication Sig Dispense Refill  . albuterol (VENTOLIN HFA) 108 (90 Base) MCG/ACT inhaler Inhale 2 puffs into the lungs every 6 (six) hours as needed for wheezing or shortness of breath. 18 g 0  . alfuzosin (UROXATRAL) 10 MG 24 hr tablet Take 10 mg by mouth as needed. For UTI    . ALPRAZolam (XANAX) 1 MG tablet Take 1 mg by mouth 4 (four) times daily.     . carvedilol (COREG) 25 MG tablet TAKE 1 TABLET BY MOUTH TWICE DAILY WITH A MEAL. 180 tablet 0  . ciprofloxacin (CIPRO) 750 MG tablet Take 1 tablet (750 mg total) by mouth 2 (two) times daily. (Patient not taking: Reported on 03/22/2018) 14 tablet 0  . clopidogrel (PLAVIX) 75 MG tablet Take 1 tablet (75 mg total) by mouth daily. Pt needs to schedule an appt with our office at [phone] for future refills. 30 tablet 2  . cyanocobalamin (,VITAMIN B-12,) 1000 MCG/ML injection Inject 1 mL (1,000 mcg total) into the muscle once a week. For 4 weeks and then once per month 10 mL 3  . diazepam (VALIUM) 10 MG tablet Take 10 mg by mouth at bedtime as needed for anxiety.   2  . ezetimibe (ZETIA) 10 MG tablet Take 1 tablet (10 mg total) by mouth daily. 30 tablet 2  . gabapentin (NEURONTIN) 300 MG capsule Take 3 capsules (900 mg total) by mouth 3 (three) times daily. 270 capsule 5  . lactulose (CHRONULAC) 10 GM/15ML solution Take 15 mLs (10 g total) by mouth 2 (two) times daily as needed for mild constipation. 1892 mL 0  . methadone (METHADOSE) 40 MG disintegrating tablet Take 200 mg by mouth daily.    . nitroGLYCERIN (NITROLINGUAL) 0.4 MG/SPRAY spray Place under the tongue.    . ondansetron (ZOFRAN-ODT) 8 MG disintegrating tablet Take 1 tablet (8 mg total) by mouth every 8 (eight) hours as needed for nausea or vomiting. 90 tablet 1  . polyethylene glycol powder (GLYCOLAX/MIRALAX) powder MIX 1 CAPFUL TWICE DAILY AS NEEDED. 250 g 0  . ranitidine (ZANTAC) 150 MG tablet Take 300 mg by  mouth at bedtime.    . rosuvastatin (CRESTOR) 40 MG tablet Take 1 tablet (40 mg total) by mouth every evening. 30 tablet 2  . spironolactone (ALDACTONE) 25 MG tablet Take 1 tablet (25 mg total) by mouth daily. TAKE (1/2) TABLET DAILY. 15 tablet 2  . sucralfate (CARAFATE) 1 g tablet Take 1 tablet (1 g total) by mouth 4 (four) times daily -  with meals and at bedtime. 120 tablet 0  . tamsulosin (FLOMAX) 0.4 MG CAPS capsule Take 1 capsule (0.4 mg total) by mouth daily after breakfast. 90 capsule 0  . torsemide (DEMADEX) 20 MG tablet Take 1 tablet (20 mg total) by mouth daily  as needed. For swelling in legs 90 tablet 2  . traZODone (DESYREL) 100 MG tablet Take 50 mg by mouth at bedtime as needed for sleep.     Marland Kitchen triamcinolone (NASACORT AQ) 55 MCG/ACT AERO nasal inhaler Place 1 spray into the nose daily. 1 Inhaler 12  . VIAGRA 100 MG tablet TAKE 1 TABLET APPROXIMATELY 1 HOUR BEFORE NEEDING AS DIRECTED. (Patient taking differently: Take 100 mg by mouth as needed for erectile dysfunction. ) 4 tablet 0   Current Facility-Administered Medications on File Prior to Visit  Medication Dose Route Frequency Provider Last Rate Last Dose  . cyanocobalamin ((VITAMIN B-12)) injection 1,000 mcg  1,000 mcg Intramuscular Q30 days Wardell Honour, MD   1,000 mcg at 02/04/18 1455    ALLERGIES: Allergies  Allergen Reactions  . Darvocet [Propoxyphene N-Acetaminophen] Anaphylaxis    Throat closes  . Propoxyphene Anaphylaxis and Swelling    FAMILY HISTORY: Family History  Problem Relation Age of Onset  . Heart disease Father 55       Died of MI  . Cancer Mother    ***.  SOCIAL HISTORY: Social History   Socioeconomic History  . Marital status: Married    Spouse name: Not on file  . Number of children: 4  . Years of education: Not on file  . Highest education level: Not on file  Occupational History  . Not on file  Social Needs  . Financial resource strain: Not on file  . Food insecurity:    Worry:  Not on file    Inability: Not on file  . Transportation needs:    Medical: Not on file    Non-medical: Not on file  Tobacco Use  . Smoking status: Current Every Day Smoker    Packs/day: 0.50    Years: 29.00    Pack years: 14.50    Types: Cigarettes, E-cigarettes    Last attempt to quit: 07/14/2015    Years since quitting: 2.8  . Smokeless tobacco: Never Used  . Tobacco comment: no cigarette in 4 days but yes to e-cig  Substance and Sexual Activity  . Alcohol use: No    Alcohol/week: 0.0 standard drinks  . Drug use: Yes    Types: Marijuana    Comment: Urine showed THC  . Sexual activity: Not on file  Lifestyle  . Physical activity:    Days per week: Not on file    Minutes per session: Not on file  . Stress: Not on file  Relationships  . Social connections:    Talks on phone: Not on file    Gets together: Not on file    Attends religious service: Not on file    Active member of club or organization: Not on file    Attends meetings of clubs or organizations: Not on file    Relationship status: Not on file  . Intimate partner violence:    Fear of current or ex partner: Not on file    Emotionally abused: Not on file    Physically abused: Not on file    Forced sexual activity: Not on file  Other Topics Concern  . Not on file  Social History Narrative   Marital status: married x 12 years; wife is severe alcoholic.      Children: 3 married daughters (23, 26, 20); 1 son (8yo); 3 grandchildren born in 2017      Lives: with wife, son      Left-handed   Caffeine: 3 drinks per day  REVIEW OF SYSTEMS: Constitutional: No fevers, chills, or sweats, no generalized fatigue, change in appetite Eyes: No visual changes, double vision, eye pain Ear, nose and throat: No hearing loss, ear pain, nasal congestion, sore throat Cardiovascular: No chest pain, palpitations Respiratory:  No shortness of breath at rest or with exertion, wheezes GastrointestinaI: No nausea, vomiting,  diarrhea, abdominal pain, fecal incontinence Genitourinary:  No dysuria, urinary retention or frequency Musculoskeletal:  No neck pain, back pain Integumentary: No rash, pruritus, skin lesions Neurological: as above Psychiatric: No depression, insomnia, anxiety Endocrine: No palpitations, fatigue, diaphoresis, mood swings, change in appetite, change in weight, increased thirst Hematologic/Lymphatic:  No purpura, petechiae. Allergic/Immunologic: no itchy/runny eyes, nasal congestion, recent allergic reactions, rashes  PHYSICAL EXAM: *** General: No acute distress.  Patient appears ***-groomed.  *** body habitus. Head:  Normocephalic/atraumatic Eyes:  Fundi examined but not visualized Neck: supple, no paraspinal tenderness, full range of motion Heart:  Regular rate and rhythm Lungs:  Clear to auscultation bilaterally Back: No paraspinal tenderness Neurological Exam: alert and oriented to person, place, and time. Attention span and concentration intact, recent and remote memory intact, fund of knowledge intact.  Speech fluent and not dysarthric, language intact.  CN II-XII intact. Bulk and tone normal, muscle strength 5/5 throughout.  Sensation to light touch, temperature and vibration intact.  Deep tendon reflexes 2+ throughout, toes downgoing.  Finger to nose and heel to shin testing intact.  Gait normal, Romberg negative.  IMPRESSION: 1.  Left upper extremity weakness.  NCV-EMG consistent with a polyradiculopathy.  Brachial plexopathy less likely given normal sensory responses.  Imaging of the cervical spine is recommended.  Unfortunately, MRI is not an option as he has an AICD.  Options would be CT cervical spine with and without contrast to look for any obvious stenosis.  CT myelogram would provide more information.   PLAN: 1.  After discussion, ***.  If unremarkable, will likely need an LP for CSF analysis.  Metta Clines, DO  CC: ***

## 2018-05-04 ENCOUNTER — Ambulatory Visit: Payer: Self-pay | Admitting: Neurology

## 2018-05-05 ENCOUNTER — Encounter: Payer: Self-pay | Admitting: Neurology

## 2018-05-06 ENCOUNTER — Telehealth: Payer: Self-pay | Admitting: Neurology

## 2018-05-06 NOTE — Telephone Encounter (Signed)
Patient dismissed from [facility] by Metta Clines DO, effective 05/05/18.  Dismissal letter sent out by 1st class mail. LM

## 2018-05-13 DIAGNOSIS — G4733 Obstructive sleep apnea (adult) (pediatric): Secondary | ICD-10-CM | POA: Diagnosis not present

## 2018-05-13 DIAGNOSIS — G4737 Central sleep apnea in conditions classified elsewhere: Secondary | ICD-10-CM | POA: Diagnosis not present

## 2018-05-17 DIAGNOSIS — N5201 Erectile dysfunction due to arterial insufficiency: Secondary | ICD-10-CM | POA: Diagnosis not present

## 2018-05-17 DIAGNOSIS — E291 Testicular hypofunction: Secondary | ICD-10-CM | POA: Diagnosis not present

## 2018-06-11 ENCOUNTER — Other Ambulatory Visit (HOSPITAL_COMMUNITY): Payer: Self-pay | Admitting: Internal Medicine

## 2018-06-12 DIAGNOSIS — G4737 Central sleep apnea in conditions classified elsewhere: Secondary | ICD-10-CM | POA: Diagnosis not present

## 2018-06-12 DIAGNOSIS — G4733 Obstructive sleep apnea (adult) (pediatric): Secondary | ICD-10-CM | POA: Diagnosis not present

## 2018-07-11 ENCOUNTER — Other Ambulatory Visit (HOSPITAL_COMMUNITY): Payer: Self-pay | Admitting: Internal Medicine

## 2018-07-14 ENCOUNTER — Other Ambulatory Visit: Payer: Self-pay | Admitting: Emergency Medicine

## 2018-07-14 DIAGNOSIS — F4322 Adjustment disorder with anxiety: Secondary | ICD-10-CM | POA: Diagnosis not present

## 2018-07-14 MED ORDER — LACTULOSE 10 GM/15ML PO SOLN: 10.0000 g | Freq: Two times a day (BID) | ORAL | 0 refills | Status: DC | PRN

## 2018-07-14 NOTE — Telephone Encounter (Signed)
Copied from [place] [id number]. Topic: Quick Communication - Rx Refill/Question >> Jul 14, 2018 12:19 PM Bea Graff, NT wrote: Medication: lactulose [facility]) 10 GM/15ML solution  Has the patient contacted their pharmacy? Yes.   (Agent: If no, request that the patient contact the pharmacy for the refill.) (Agent: If yes, when and what did the pharmacy advise?)  Preferred Pharmacy (with phone number or street name): [place], [place]. [phone] (Phone) [phone] (Fax)    Agent: Please be advised that RX refills may take up to 3 business days. We ask that you follow-up with your pharmacy.

## 2018-07-14 NOTE — Telephone Encounter (Signed)
Patient has an appointment on 08/05/2018 to establish with Dr. Pamella Pert / Will refill lactulose until visit /

## 2018-08-05 ENCOUNTER — Ambulatory Visit: Payer: BLUE CROSS/BLUE SHIELD | Admitting: Emergency Medicine

## 2018-08-05 ENCOUNTER — Encounter: Payer: Self-pay | Admitting: Emergency Medicine

## 2018-08-05 ENCOUNTER — Other Ambulatory Visit: Payer: Self-pay

## 2018-08-05 VITALS — BP 152/78 | HR 73 | Temp 98.0°F | Ht 72.0 in | Wt 186.2 lb

## 2018-08-05 DIAGNOSIS — K5903 Drug induced constipation: Secondary | ICD-10-CM

## 2018-08-05 DIAGNOSIS — R110 Nausea: Secondary | ICD-10-CM | POA: Diagnosis not present

## 2018-08-05 MED ORDER — ONDANSETRON 8 MG PO TBDP: 8.0000 mg | ORAL_TABLET | Freq: Three times a day (TID) | ORAL | 1 refills | Status: DC | PRN

## 2018-08-05 MED ORDER — LACTULOSE 10 GM/15ML PO SOLN: 10.0000 g | Freq: Two times a day (BID) | ORAL | 0 refills | Status: DC | PRN

## 2018-08-05 NOTE — Patient Instructions (Signed)

## 2018-08-05 NOTE — Progress Notes (Signed)
[name] 58 y.o.   Chief Complaint  Patient presents with  . Transitions Of Care  . Medication Refill    Zofran & lactulose    HISTORY OF PRESENT ILLNESS: This is a 58 y.o. male with multiple medical problems here to establish and transfer care.  Used to see Dr. Tamala Julian.  No complaints today.  Sees different specialists including pain management clinic.  On methadone daily. Requesting refill of his Zofran and lactulose.  Active problem list as follows:  Patient Active Problem List   Diagnosis Date Noted  . Chronic nausea 08/05/2018  . Cough 03/15/2018  . Right lower lobe pneumonia ([place]) 03/15/2018  . Tobacco dependence 03/15/2018  . Marijuana use, episodic 03/15/2018  . Hypoxia 03/11/2018  . Acute respiratory failure with hypoxia ([place]) 03/11/2018  . Acute on chronic congestive heart failure ([place])   . Hypogonadism in male 01/16/2018  . Vitamin B12 deficiency 01/16/2018  . Central sleep apnea 07/27/2017  . Treatment-emergent central sleep apnea 07/27/2017  . Chronic respiratory failure with hypoxia ([place]) 07/27/2017  . COPD  GOLD 0 01/02/2017  . Hemoptysis 01/01/2017  . Memory difficulty 07/22/2016  . Constipation 09/04/2014  . Unstable angina ([place]) 06/14/2014  . Therapeutic opioid induced constipation 04/02/2014  . Hypoxemia 09/25/2013  . Erythrocytosis 09/18/2013  . Erectile dysfunction 11/24/2012  . 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 MAI (mycobacterium avium-intracellulare) ([place]) 05/16/2012  . 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 PAD (peripheral artery disease) ([place]) 02/24/2011  . Dual implantable cardioverter-defibrillator in situ 12/24/2010  . Depression 12/09/2010  . cHistory of testicular cancer 12/09/2010  . Benzodiazepine dependence ([place]) 12/09/2010  . Narcotic dependence ([place]) 12/09/2010  . Chronic back pain 12/05/2010  . Anxiety disorder 10/17/2010  . CAD, NATIVE  VESSEL 09/15/2010  . COMBINED HEART FAILURE, CHRONIC 09/15/2010  . CARDIOMYOPATHY, ISCHEMIC 09/12/2010     HPI   Prior to Admission medications   Medication Sig Start Date End Date Taking? Authorizing Provider  albuterol (VENTOLIN HFA) 108 (90 Base) MCG/ACT inhaler Inhale 2 puffs into the lungs every 6 (six) hours as needed for wheezing or shortness of breath. 03/14/18  Yes [place], Carley D, DO  ALPRAZolam (XANAX) 1 MG tablet Take 1 mg by mouth 4 (four) times daily.    Yes [provider]  carvedilol (COREG) 25 MG tablet TAKE 1 TABLET BY MOUTH TWICE DAILY WITH A MEAL. 04/14/18  Yes Bensimhon, Shaune Pascal, MD  clopidogrel (PLAVIX) 75 MG tablet Take 1 tablet (75 mg total) by mouth daily. Pt needs to schedule an appt with our office at [phone] for future refills. 04/12/18  Yes Bensimhon, Shaune Pascal, MD  diazepam (VALIUM) 10 MG tablet Take 10 mg by mouth at bedtime as needed for anxiety.  06/04/17  Yes [provider]  ezetimibe (ZETIA) 10 MG tablet Take 1 tablet (10 mg total) by mouth daily. 04/12/18  Yes Bensimhon, Shaune Pascal, MD  gabapentin (NEURONTIN) 300 MG capsule Take 3 capsules (900 mg total) by mouth 3 (three) times daily. 01/12/18  Yes Wardell Honour, MD  lactulose (CHRONULAC) 10 GM/15ML solution Take 15 mLs (10 g total) by mouth 2 (two) times daily as needed for mild constipation. 07/14/18  Yes [name], MD  lisinopril (PRINIVIL,ZESTRIL) 5 MG tablet  07/20/18  Yes [provider]  methadone (METHADOSE) 40 MG disintegrating tablet Take 200 mg by mouth daily.   Yes [provider]  nitroGLYCERIN (  NITROLINGUAL) 0.4 MG/SPRAY spray Place under the tongue. 03/20/15  Yes [provider]  ondansetron (ZOFRAN-ODT) 8 MG disintegrating tablet Take 1 tablet (8 mg total) by mouth every 8 (eight) hours as needed for nausea or vomiting. 01/12/18  Yes Wardell Honour, MD  rosuvastatin (CRESTOR) 40 MG tablet Take 1 tablet (40 mg total) by mouth every evening.  04/12/18  Yes Bensimhon, Shaune Pascal, MD  spironolactone (ALDACTONE) 25 MG tablet Take 1 tablet (25 mg total) by mouth daily. TAKE (1/2) TABLET DAILY. 04/12/18  Yes Bensimhon, Shaune Pascal, MD  sucralfate (CARAFATE) 1 g tablet Take 1 tablet (1 g total) by mouth 4 (four) times daily -  with meals and at bedtime. 03/14/18  Yes [place], Carley D, DO  tamsulosin (FLOMAX) 0.4 MG CAPS capsule Take 1 capsule (0.4 mg total) by mouth daily after breakfast. 11/22/17  Yes Wardell Honour, MD  torsemide (DEMADEX) 20 MG tablet Take 1 tablet (20 mg total) by mouth daily as needed. For swelling in legs 10/07/15  Yes Bensimhon, Shaune Pascal, MD  traZODone (DESYREL) 100 MG tablet Take 50 mg by mouth at bedtime as needed for sleep.    Yes [provider]  VIAGRA 100 MG tablet TAKE 1 TABLET APPROXIMATELY 1 HOUR BEFORE NEEDING AS DIRECTED. Patient taking differently: Take 100 mg by mouth as needed for erectile dysfunction.  07/24/16  Yes Bensimhon, Shaune Pascal, MD  alfuzosin (UROXATRAL) 10 MG 24 hr tablet Take 10 mg by mouth as needed. For UTI 03/17/13   [provider]  cyanocobalamin (,VITAMIN B-12,) 1000 MCG/ML injection Inject 1 mL (1,000 mcg total) into the muscle once a week. For 4 weeks and then once per month Patient not taking: Reported on 08/05/2018 02/04/18   Wardell Honour, MD  polyethylene glycol powder (GLYCOLAX/MIRALAX) powder MIX 1 CAPFUL TWICE DAILY AS NEEDED. Patient not taking: Reported on 08/05/2018 06/15/15   Copland, [name] Filler, MD  triamcinolone (NASACORT AQ) 55 MCG/ACT AERO nasal inhaler Place 1 spray into the nose daily. Patient not taking: Reported on 08/05/2018 04/24/16   Chesley Mires, MD  zolpidem Lorrin Mais) 10 MG tablet  07/11/18   [provider]    Allergies  Allergen Reactions  . Darvocet [Propoxyphene N-Acetaminophen] Anaphylaxis    Throat closes  . Propoxyphene Anaphylaxis and Swelling    Patient Active Problem List   Diagnosis Date Noted  . Cough 03/15/2018  . Right lower  lobe pneumonia ([place]) 03/15/2018  . Tobacco dependence 03/15/2018  . Marijuana use, episodic 03/15/2018  . Hypoxia 03/11/2018  . Acute respiratory failure with hypoxia ([place]) 03/11/2018  . Acute on chronic congestive heart failure ([place])   . Hypogonadism in male 01/16/2018  . Vitamin B12 deficiency 01/16/2018  . Central sleep apnea 07/27/2017  . Treatment-emergent central sleep apnea 07/27/2017  . Chronic respiratory failure with hypoxia ([place]) 07/27/2017  . COPD  GOLD 0 01/02/2017  . Hemoptysis 01/01/2017  . Memory difficulty 07/22/2016  . Constipation 09/04/2014  . Unstable angina ([place]) 06/14/2014  . Therapeutic opioid induced constipation 04/02/2014  . Hypoxemia 09/25/2013  . Erythrocytosis 09/18/2013  . Erectile dysfunction 11/24/2012  . 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 MAI (mycobacterium avium-intracellulare) ([place]) 05/16/2012  . 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 PAD (peripheral artery disease) ([place]) 02/24/2011  . Dual implantable cardioverter-defibrillator in situ 12/24/2010  . Depression 12/09/2010  . cHistory of testicular cancer 12/09/2010  . Benzodiazepine dependence ([place]) 12/09/2010  .  Narcotic dependence ([place]) 12/09/2010  . Chronic back pain 12/05/2010  . Anxiety disorder 10/17/2010  . CAD, NATIVE VESSEL 09/15/2010  . COMBINED HEART FAILURE, CHRONIC 09/15/2010  . CARDIOMYOPATHY, ISCHEMIC 09/12/2010    Past Medical History:  Diagnosis Date  . Anxiety   . Automatic implantable cardiac defibrillator [employer]    Analyze ST  . Benign prostatic hypertrophy   . Benzodiazepine dependence (HCC)    chronic  . CAD (coronary artery disease)    Last cath 2/12. 3-v CAD. Failed PCI of distal RCAc  CATH [facility] 4/13 with DES to  LAD X2  . CHF (congestive heart failure) ([place])   . Chronic back pain    lumbar stenosis  . Chronic systolic heart failure (HCC)    EF 20-25%. s/p ST. Jude ICD  .  Depression   . DJD (degenerative joint disease)   . History of testicular cancer   . Narcotic dependence (HCC)    chronic  . Vitamin B12 deficiency 07/22/2016    Past Surgical History:  Procedure Laterality Date  . ASD REPAIR, SINUS VENOSUS    . CARDIAC DEFIBRILLATOR PLACEMENT  08/2010  . HERNIA REPAIR    . LEFT HEART CATHETERIZATION WITH CORONARY ANGIOGRAM N/A 06/14/2014   Procedure: LEFT HEART CATHETERIZATION WITH CORONARY ANGIOGRAM;  Surgeon: Jolaine Artist, MD;  Location: [facility] CATH LAB;  Service: Cardiovascular;  Laterality: N/A;  . TESTICLE SURGERY     testicular cancer surgery left testicle removed    Social History   Socioeconomic History  . Marital status: Married    Spouse name: Not on file  . Number of children: 4  . Years of education: Not on file  . Highest education level: Not on file  Occupational History  . Not on file  Social Needs  . Financial resource strain: Not on file  . Food insecurity:    Worry: Not on file    Inability: Not on file  . Transportation needs:    Medical: Not on file    Non-medical: Not on file  Tobacco Use  . Smoking status: Current Every Day Smoker    Packs/day: 0.50    Years: 29.00    Pack years: 14.50    Types: Cigarettes, E-cigarettes    Last attempt to quit: 07/14/2015    Years since quitting: 3.0  . Smokeless tobacco: Never Used  . Tobacco comment: no cigarette in 4 days but yes to e-cig  Substance and Sexual Activity  . Alcohol use: No    Alcohol/week: 0.0 standard drinks  . Drug use: Yes    Types: Marijuana    Comment: Urine showed THC  . Sexual activity: Not on file  Lifestyle  . Physical activity:    Days per week: Not on file    Minutes per session: Not on file  . Stress: Not on file  Relationships  . Social connections:    Talks on phone: Not on file    Gets together: Not on file    Attends religious service: Not on file    Active member of club or organization: Not on file    Attends meetings of clubs or  organizations: Not on file    Relationship status: Not on file  . Intimate partner violence:    Fear of current or ex partner: Not on file    Emotionally abused: Not on file    Physically abused: Not on file    Forced sexual activity: Not on file  Other Topics Concern  .  Not on file  Social History Narrative   Marital status: married x 12 years; wife is severe alcoholic.      Children: 3 married daughters (23, 43, 76); 1 son (68yo); 3 grandchildren born in 2017      Lives: with wife, son      Left-handed   Caffeine: 3 drinks per day    Family History  Problem Relation Age of Onset  . Heart disease Father 51       Died of MI  . Cancer Mother      Review of Systems  Constitutional: Negative.  Negative for chills and fever.  Respiratory: Negative for shortness of breath.   Cardiovascular: Negative for chest pain and palpitations.  Gastrointestinal: Positive for constipation (Chronic) and nausea (Chronic).  Skin: Negative.  Negative for rash.  Neurological: Negative for dizziness and headaches.    Vitals:   08/05/18 1425  BP: (!) 152/78  Pulse: 73  Temp: 98 F (36.7 C)  SpO2: 95%    Physical Exam Vitals signs reviewed.  Constitutional:      Appearance: Normal appearance.  HENT:     Head: Normocephalic.  Eyes:     Extraocular Movements: Extraocular movements intact.  Neck:     Musculoskeletal: Normal range of motion.  Cardiovascular:     Rate and Rhythm: Normal rate.  Pulmonary:     Effort: Pulmonary effort is normal.  Musculoskeletal: Normal range of motion.  Skin:    General: Skin is dry.  Neurological:     General: No focal deficit present.     Mental Status: He is alert and oriented to person, place, and time.  Psychiatric:        Mood and Affect: Mood normal.        Behavior: Behavior normal.      ASSESSMENT & PLAN: [name] was seen today for transitions of care and medication refill.  Diagnoses and all orders for this visit:  Chronic  nausea -     ondansetron (ZOFRAN-ODT) 8 MG disintegrating tablet; Take 1 tablet (8 mg total) by mouth every 8 (eight) hours as needed for nausea or vomiting.  Drug-induced constipation -     lactulose (CHRONULAC) 10 GM/15ML solution; Take 15 mLs (10 g total) by mouth 2 (two) times daily as needed for mild constipation.    Patient Instructions  Health Maintenance, Male A healthy lifestyle and preventive care is important for your health and wellness. Ask your health care provider about what schedule of regular examinations is right for you. What should I know about weight and diet? Eat a Healthy Diet  Eat plenty of vegetables, fruits, whole grains, low-fat dairy products, and lean protein.  Do not eat a lot of foods high in solid fats, added sugars, or salt.  Maintain a Healthy Weight Regular exercise can help you achieve or maintain a healthy weight. You should:  Do at least 150 minutes of exercise each week. The exercise should increase your heart rate and make you sweat (moderate-intensity exercise).  Do strength-training exercises at least twice a week. Watch Your Levels of Cholesterol and Blood Lipids  Have your blood tested for lipids and cholesterol every 5 years starting at 59 years of age. If you are at high risk for heart disease, you should start having your blood tested when you are 59 years old. You may need to have your cholesterol levels checked more often if: ? Your lipid or cholesterol levels are high. ? You are older than 59  years of age. ? You are at high risk for heart disease. What should I know about cancer screening? Many types of cancers can be detected early and may often be prevented. Lung Cancer  You should be screened every year for lung cancer if: ? You are a current smoker who has smoked for at least 30 years. ? You are a former smoker who has quit within the past 15 years.  Talk to your health care provider about your screening options, when you  should start screening, and how often you should be screened. Colorectal Cancer  Routine colorectal cancer screening usually begins at 59 years of age and should be repeated every 5-10 years until you are 59 years old. You may need to be screened more often if early forms of precancerous polyps or small growths are found. Your health care provider may recommend screening at an earlier age if you have risk factors for colon cancer.  Your health care provider may recommend using home test kits to check for hidden blood in the stool.  A small camera at the end of a tube can be used to examine your colon (sigmoidoscopy or colonoscopy). This checks for the earliest forms of colorectal cancer. Prostate and Testicular Cancer  Depending on your age and overall health, your health care provider may do certain tests to screen for prostate and testicular cancer.  Talk to your health care provider about any symptoms or concerns you have about testicular or prostate cancer. Skin Cancer  Check your skin from head to toe regularly.  Tell your health care provider about any new moles or changes in moles, especially if: ? There is a change in a mole's size, shape, or color. ? You have a mole that is larger than a pencil eraser.  Always use sunscreen. Apply sunscreen liberally and repeat throughout the day.  Protect yourself by wearing long sleeves, pants, a wide-brimmed hat, and sunglasses when outside. What should I know about heart disease, diabetes, and high blood pressure?  If you are 59-34 years of age, have your blood pressure checked every 3-5 years. If you are 50 years of age or older, have your blood pressure checked every year. You should have your blood pressure measured twice-once when you are at a hospital or clinic, and once when you are not at a hospital or clinic. Record the average of the two measurements. To check your blood pressure when you are not at a hospital or clinic, you can  use: ? An automated blood pressure machine at a pharmacy. ? A home blood pressure monitor.  Talk to your health care provider about your target blood pressure.  If you are between 35-42 years old, ask your health care provider if you should take aspirin to prevent heart disease.  Have regular diabetes screenings by checking your fasting blood sugar level. ? If you are at a normal weight and have a low risk for diabetes, have this test once every three years after the age of 9. ? If you are overweight and have a high risk for diabetes, consider being tested at a younger age or more often.  A one-time screening for abdominal aortic aneurysm (AAA) by ultrasound is recommended for men aged 106-75 years who are current or former smokers. What should I know about preventing infection? Hepatitis B If you have a higher risk for hepatitis B, you should be screened for this virus. Talk with your health care provider to find out if you  are at risk for hepatitis B infection. Hepatitis C Blood testing is recommended for:  Everyone born from [birth year] through 1966.  Anyone with known risk factors for hepatitis C. Sexually Transmitted Diseases (STDs)  You should be screened each year for STDs including gonorrhea and chlamydia if: ? You are sexually active and are younger than 59 years of age. ? You are older than 59 years of age and your health care provider tells you that you are at risk for this type of infection. ? Your sexual activity has changed since you were last screened and you are at an increased risk for chlamydia or gonorrhea. Ask your health care provider if you are at risk.  Talk with your health care provider about whether you are at high risk of being infected with HIV. Your health care provider may recommend a prescription medicine to help prevent HIV infection. What else can I do?  Schedule regular health, dental, and eye exams.  Stay current with your vaccines (immunizations).  Do  not use any tobacco products, such as cigarettes, chewing tobacco, and e-cigarettes. If you need help quitting, ask your health care provider.  Limit alcohol intake to no more than 2 drinks per day. One drink equals 12 ounces of beer, 5 ounces of wine, or 1 ounces of hard liquor.  Do not use street drugs.  Do not share needles.  Ask your health care provider for help if you need support or information about quitting drugs.  Tell your health care provider if you often feel depressed.  Tell your health care provider if you have ever been abused or do not feel safe at home. This information is not intended to replace advice given to you by your health care provider. Make sure you discuss any questions you have with your health care provider. Document Released: 12/26/2007 Document Revised: 02/26/2016 Document Reviewed: 04/02/2015 Elsevier Interactive Patient Education  2019 Elsevier Inc.      Agustina Caroli, MD [facility]

## 2018-09-09 DIAGNOSIS — F322 Major depressive disorder, single episode, severe without psychotic features: Secondary | ICD-10-CM | POA: Diagnosis not present

## 2018-09-15 ENCOUNTER — Encounter: Payer: Self-pay | Admitting: Family Medicine

## 2018-09-15 ENCOUNTER — Other Ambulatory Visit: Payer: Self-pay

## 2018-09-15 ENCOUNTER — Ambulatory Visit: Payer: BLUE CROSS/BLUE SHIELD | Admitting: Family Medicine

## 2018-09-15 VITALS — BP 140/85 | HR 60 | Temp 97.2°F | Ht 72.0 in | Wt 197.0 lb

## 2018-09-15 DIAGNOSIS — I2510 Atherosclerotic heart disease of native coronary artery without angina pectoris: Secondary | ICD-10-CM | POA: Diagnosis not present

## 2018-09-15 DIAGNOSIS — E538 Deficiency of other specified B group vitamins: Secondary | ICD-10-CM

## 2018-09-15 DIAGNOSIS — D751 Secondary polycythemia: Secondary | ICD-10-CM | POA: Diagnosis not present

## 2018-09-15 DIAGNOSIS — L97909 Non-pressure chronic ulcer of unspecified part of unspecified lower leg with unspecified severity: Secondary | ICD-10-CM

## 2018-09-15 NOTE — Patient Instructions (Signed)
° ° ° °  If you have lab work done today you will be contacted with your lab results within the next 2 weeks.  If you have not heard from us then please contact us. The fastest way to get your results is to register for My Chart. ° ° °IF you received an x-ray today, you will receive an invoice from [facility]. Please contact [facility] at [phone] with questions or concerns regarding your invoice.  ° °IF you received labwork today, you will receive an invoice from [facility]. Please contact [facility] at [phone] with questions or concerns regarding your invoice.  ° °Our billing staff will not be able to assist you with questions regarding bills from these companies. ° °You will be contacted with the lab results as soon as they are available. The fastest way to get your results is to activate your My Chart account. Instructions are located on the last page of this paperwork. If you have not heard from us regarding the results in 2 weeks, please contact this office. °  ° ° ° °

## 2018-09-15 NOTE — Progress Notes (Signed)
3/5/202010:20 AM  [name] 05-12-1961, 58 y.o. male [id number]  Chief Complaint  Patient presents with  . Establish Care    HPI:   Patient is a 58 y.o. male with past medical history significant for CAD with cardiomyopathy, ICD in place, PAD, complex sleep apnea on bipap with 5L, testicular cancer, lumbar stenosis, narcotic and benzo dependence, COPD, chronic nausea, TB MAC, secondary polycythemia, c diff who presents today to establish care  Previous patient of Dr Tamala Julian Last OV July 2019  Difficult historian  Concerned for recurring blisters//ulcers in his legs and arms None soles or palms Slow healing He wonders about circulation in his legs He does have constant pain down his legs - seen by neuro, known lumbar stenosis, plan was for CT myelogram, which I cant find results.  Patient has been wo gabapentin for over a month, he found it too sedating He does not really feel that it was helping that much Discharged from neurology for missed appt  bipap with 5L every night, he reports compliance, managed by pulm  Struggles with constipation from opiates. Manages with lactulose Takes zofran daily, usually in the morning, for chronic nausea He has finally slowly been able to gain weight this year Denies any SOB, CP or edema  Wt Readings from Last 3 Encounters:  09/15/18 197 lb (89.4 kg)  08/05/18 186 lb 3.2 oz (84.5 kg)  03/22/18 201 lb 6.4 oz (91.4 kg)   He is due for routine labs  Lab Results  Component Value Date   VITAMINB12 297 12/22/2017   Lab Results  Component Value Date   CREATININE 0.99 03/14/2018   BUN 16 03/14/2018   NA 139 03/14/2018   K 4.0 03/14/2018   CL 104 03/14/2018   CO2 28 03/14/2018   Lab Results  Component Value Date   CHOL 187 06/14/2017   HDL 42 06/14/2017   LDLCALC 124 (H) 06/14/2017   TRIG 104 06/14/2017   CHOLHDL 4.5 06/14/2017   Lab Results  Component Value Date   WBC 9.5 03/11/2018   HGB 17.7 (H) 03/11/2018   HCT 53.6  (H) 03/11/2018   MCV 101.5 (H) 03/11/2018   PLT 209 03/11/2018    Patient Care Team: System, Pcp Not In as PCP - General Govert, Natale Lay, MD as Referring Physician (Pulmonary Disease) Norma Fredrickson, MD as Consulting Physician (Psychiatry) Deboraha Sprang, MD as Consulting Physician (Cardiology) Bensimhon, Shaune Pascal, MD as Consulting Physician (Cardiology) Tanda Rockers, MD as Consulting Physician (Pulmonary Disease) Chesley Mires, MD as Consulting Physician (Pulmonary Disease) Charlynn Grimes, MD as Referring Physician (Internal Medicine)  Methadone prescribed down in [place], Dr Rayetta Pigg Sees urologist at [facility] Does not see anybody for his back or pain management  pmp reviewed Unable to see methadone Patient reports that he has cut himself back to 17m  Fall Risk  09/15/2018 02/22/2018 02/04/2018 01/12/2018 01/10/2018  Falls in the past year? 0 Yes Yes Yes Yes  Comment - - - - -  Number falls in past yr: 0 2 or more 2 or more 2 or more 2 or more  Comment - - - - -  Injury with Fall? 0 Yes - Yes No  Comment - - - - -  Risk Factor Category  - High Fall Risk - - High Fall Risk  Risk for fall due to : Medication side effect - - - -  Risk for fall due to: Comment - - - - -  Follow up - - - - -  Comment - - - - -     Depression screen [facility] 2/9 02/04/2018 01/12/2018 12/22/2017  Decreased Interest _0 Down, Depressed, Hopeless _1 PHQ - 2 Score _2 Altered sleeping 1 0 0  Tired, decreased energy 1 1 0  Change in appetite 1 0 0  Feeling bad or failure about yourself  1 0 0  Trouble concentrating - 0 0  Moving slowly or fidgety/restless 0 1 0  Suicidal thoughts 0 0 0  PHQ-9 Score _3 Some recent data might be hidden    Allergies  Allergen Reactions  . Darvocet [Propoxyphene N-Acetaminophen] Anaphylaxis    Throat closes  . Propoxyphene Anaphylaxis and Swelling    Prior to Admission medications   Medication Sig Start Date End Date Taking? Authorizing Provider   albuterol (VENTOLIN HFA) 108 (90 Base) MCG/ACT inhaler Inhale 2 puffs into the lungs every 6 (six) hours as needed for wheezing or shortness of breath. 03/14/18  Yes Bloomfield, Carley D, DO  alfuzosin (UROXATRAL) 10 MG 24 hr tablet Take 10 mg by mouth as needed. For UTI 03/17/13  Yes [provider]  ALPRAZolam Duanne Moron) 1 MG tablet Take 1 mg by mouth 4 (four) times daily.    Yes [provider]  carvedilol (COREG) 25 MG tablet TAKE 1 TABLET BY MOUTH TWICE DAILY WITH A MEAL. 04/14/18  Yes Bensimhon, Shaune Pascal, MD  clopidogrel (PLAVIX) 75 MG tablet Take 1 tablet (75 mg total) by mouth daily. Pt needs to schedule an appt with our office at [phone] for future refills. 04/12/18  Yes Bensimhon, Shaune Pascal, MD  cyanocobalamin (,VITAMIN B-12,) 1000 MCG/ML injection Inject 1 mL (1,000 mcg total) into the muscle once a week. For 4 weeks and then once per month 02/04/18  Yes Wardell Honour, MD  diazepam (VALIUM) 10 MG tablet Take 10 mg by mouth at bedtime as needed for anxiety.  06/04/17  Yes [provider]  ezetimibe (ZETIA) 10 MG tablet Take 1 tablet (10 mg total) by mouth daily. 04/12/18  Yes Bensimhon, Shaune Pascal, MD  gabapentin (NEURONTIN) 300 MG capsule Take 3 capsules (900 mg total) by mouth 3 (three) times daily. 01/12/18  Yes Wardell Honour, MD  lactulose (CHRONULAC) 10 GM/15ML solution Take 15 mLs (10 g total) by mouth 2 (two) times daily as needed for mild constipation. 08/05/18  Yes Sagardia, Ines Bloomer, MD  lisinopril (PRINIVIL,ZESTRIL) 5 MG tablet  07/20/18  Yes [provider]  methadone (METHADOSE) 40 MG disintegrating tablet Take 200 mg by mouth daily.   Yes [provider]  nitroGLYCERIN (NITROLINGUAL) 0.4 MG/SPRAY spray Place under the tongue. 03/20/15  Yes [provider]  ondansetron (ZOFRAN-ODT) 8 MG disintegrating tablet Take 1 tablet (8 mg total) by mouth every 8 (eight) hours as needed for nausea or vomiting. 08/05/18  Yes Sagardia, Ines Bloomer,  MD  polyethylene glycol powder (GLYCOLAX/MIRALAX) powder MIX 1 CAPFUL TWICE DAILY AS NEEDED. 06/15/15  Yes Copland, [name] Filler, MD  rosuvastatin (CRESTOR) 40 MG tablet Take 1 tablet (40 mg total) by mouth every evening. 04/12/18  Yes Bensimhon, Shaune Pascal, MD  sildenafil (REVATIO) 20 MG tablet TAKE 1 TABLET BY MOUTH 3 TIMES A DAY. 08/25/18  Yes [provider]  spironolactone (ALDACTONE) 25 MG tablet Take 1 tablet (25 mg total) by mouth daily. TAKE (1/2) TABLET DAILY. 04/12/18  Yes Bensimhon, Shaune Pascal, MD  sucralfate (CARAFATE) 1 g tablet Take 1 tablet (1 g  total) by mouth 4 (four) times daily -  with meals and at bedtime. 03/14/18  Yes Bloomfield, Carley D, DO  tamsulosin (FLOMAX) 0.4 MG CAPS capsule Take 1 capsule (0.4 mg total) by mouth daily after breakfast. 11/22/17  Yes Wardell Honour, MD  torsemide (DEMADEX) 20 MG tablet Take 1 tablet (20 mg total) by mouth daily as needed. For swelling in legs 10/07/15  Yes Bensimhon, Shaune Pascal, MD  traZODone (DESYREL) 100 MG tablet Take 50 mg by mouth at bedtime as needed for sleep.    Yes [provider]  triamcinolone (NASACORT AQ) 55 MCG/ACT AERO nasal inhaler Place 1 spray into the nose daily. 04/24/16  Yes Sood, Elisabeth Cara, MD  VIAGRA 100 MG tablet TAKE 1 TABLET APPROXIMATELY 1 HOUR BEFORE NEEDING AS DIRECTED. Patient taking differently: Take 100 mg by mouth as needed for erectile dysfunction.  07/24/16  Yes Bensimhon, Shaune Pascal, MD  zolpidem Lorrin Mais) 10 MG tablet  07/11/18  Yes [provider]    Past Medical History:  Diagnosis Date  . Anxiety   . Automatic implantable cardiac defibrillator [employer]    Analyze ST  . Benign prostatic hypertrophy   . Benzodiazepine dependence (HCC)    chronic  . CAD (coronary artery disease)    Last cath 2/12. 3-v CAD. Failed PCI of distal RCAc  CATH [facility] 4/13 with DES to  LAD X2  . CHF (congestive heart failure) ([place])   . Chronic back pain    lumbar stenosis  . Chronic systolic heart failure (HCC)     EF 20-25%. s/p ST[employer] Jude ICD  . Depression   . DJD (degenerative joint disease)   . History of testicular cancer   . Narcotic dependence (HCC)    chronic  . Polycythemia, secondary    S/p hematology consultation for polycythema on 01/06/18.  S/p bone marrow biopsy on 12/31/17 that was negative for myeloproliferative disorder.  Feels secondary to sleep apnea, active smoking, testosterone supplementation, nocturnal hypoxia  . Vitamin B12 deficiency 07/22/2016    Past Surgical History:  Procedure Laterality Date  . ASD REPAIR, SINUS VENOSUS    . CARDIAC DEFIBRILLATOR PLACEMENT  08/2010  . HERNIA REPAIR    . LEFT HEART CATHETERIZATION WITH CORONARY ANGIOGRAM N/A 06/14/2014   Procedure: LEFT HEART CATHETERIZATION WITH CORONARY ANGIOGRAM;  Surgeon: Jolaine Artist, MD;  Location: [facility] CATH LAB;  Service: Cardiovascular;  Laterality: N/A;  . TESTICLE SURGERY     testicular cancer surgery left testicle removed    Social History   Tobacco Use  . Smoking status: Current Every Day Smoker    Packs/day: 0.50    Years: 29.00    Pack years: 14.50    Types: Cigarettes, E-cigarettes    Last attempt to quit: 07/14/2015    Years since quitting: 3.1  . Smokeless tobacco: Never Used  . Tobacco comment: no cigarette in 4 days but yes to e-cig  Substance Use Topics  . Alcohol use: No    Alcohol/week: 0.0 standard drinks    Family History  Problem Relation Age of Onset  . Heart disease Father 58       Died of MI  . Cancer Mother     Review of Systems  Constitutional: Negative for chills and fever.  Respiratory: Negative for cough and shortness of breath.   Cardiovascular: Negative for chest pain, palpitations and leg swelling.  Gastrointestinal: Positive for constipation and nausea. Negative for abdominal pain and vomiting.  Musculoskeletal: Positive for back pain.  Neurological: Positive for tingling and focal weakness.  per hpi  OBJECTIVE:  Blood pressure 140/85, pulse 60, temperature  (!) 97.2 F (36.2 C), temperature source Oral, height 6' (1.829 m), weight 197 lb (89.4 kg), SpO2 98 %. Body mass index is 26.72 kg/m.   Physical Exam Vitals signs and nursing note reviewed.  Constitutional:      Appearance: He is well-developed.  HENT:     Head: Normocephalic and atraumatic.  Eyes:     Conjunctiva/sclera: Conjunctivae normal.     Pupils: Pupils are equal, round, and reactive to light.  Neck:     Musculoskeletal: Neck supple.  Cardiovascular:     Rate and Rhythm: Normal rate and regular rhythm.     Pulses:          Dorsalis pedis pulses are 1+ on the right side and 1+ on the left side.       Posterior tibial pulses are 1+ on the right side and 1+ on the left side.     Heart sounds: No murmur. No friction rub. No gallop.   Pulmonary:     Effort: Pulmonary effort is normal.     Breath sounds: Normal breath sounds. No wheezing or rales.  Musculoskeletal:     Right lower leg: No edema.     Left lower leg: No edema.  Skin:    General: Skin is warm and dry.     Comments: Scattered 1-2 mm mild hyperpigmented macules, non tender, mostly BLE, less BUE No involvements of palms/soles/torso Nails yellow and discolored but otherwise unremarkable  Neurological:     Mental Status: He is alert and oriented to person, place, and time.     ASSESSMENT and PLAN  1. Vitamin B12 deficiency Checking labs today, medications will be adjusted as needed.  - Vitamin B12  2. Atherosclerosis of native coronary artery of native heart without angina pectoris Asymptomatic. No ssx of fluid overload. Cont current regime. Sees cards once a year - Comprehensive metabolic panel - Lipid panel  3. Ulcer of lower extremity, unspecified laterality, unspecified ulcer stage ([place]) Unclear what skin lesions are. Patient reports they were shallow ulcers at one time that now seem to be slowly healing. Has risk factors for PAD. Checking ABI - US ARTERIAL ABI (SCREENING LOWER EXTREMITY);  Future  4. Polycythemia, secondary Evaluated by heme. Patient compliant with bipap and oxygen. Checking cbc today - CBC with Differential/Platelet  Other orders - sildenafil (REVATIO) 20 MG tablet; TAKE 1 TABLET BY MOUTH 3 TIMES A DAY.  Return in about 3 months (around 12/16/2018).    [name], MD Primary [facility] at [facility] [address], [place] [address] Ph.  [phone] Fax [phone]

## 2018-09-16 LAB — COMPREHENSIVE METABOLIC PANEL
ALT: 12 IU/L (ref 0–44)
AST: 14 IU/L (ref 0–40)
Albumin/Globulin Ratio: 1.6 (ref 1.2–2.2)
Albumin: 4.7 g/dL (ref 3.8–4.9)
Alkaline Phosphatase: 101 IU/L (ref 39–117)
BUN/Creatinine Ratio: 11 (ref 9–20)
BUN: 11 mg/dL (ref 6–24)
Bilirubin Total: 0.5 mg/dL (ref 0.0–1.2)
CO2: 21 mmol/L (ref 20–29)
Calcium: 10.1 mg/dL (ref 8.7–10.2)
Chloride: 98 mmol/L (ref 96–106)
Creatinine, Ser: 0.98 mg/dL (ref 0.76–1.27)
GFR calc Af Amer: 98 mL/min/{1.73_m2} (ref >59)
GFR calc non Af Amer: 85 mL/min/{1.73_m2} (ref >59)
Globulin, Total: 3 g/dL (ref 1.5–4.5)
Glucose: 85 mg/dL (ref 65–99)
Potassium: 5 mmol/L (ref 3.5–5.2)
Sodium: 140 mmol/L (ref 134–144)
Total Protein: 7.7 g/dL (ref 6.0–8.5)

## 2018-09-16 LAB — CBC WITH DIFFERENTIAL/PLATELET
Basophils Absolute: 0 10*3/uL (ref 0.0–0.2)
Basos: 1 %
EOS (ABSOLUTE): 0.2 10*3/uL (ref 0.0–0.4)
Eos: 3 %
Hematocrit: 53.6 % — ABNORMAL HIGH (ref 37.5–51.0)
Hemoglobin: 18.5 g/dL — ABNORMAL HIGH (ref 13.0–17.7)
Immature Grans (Abs): 0 10*3/uL (ref 0.0–0.1)
Immature Granulocytes: 0 %
Lymphocytes Absolute: 1.4 10*3/uL (ref 0.7–3.1)
Lymphs: 19 %
MCH: 31.9 pg (ref 26.6–33.0)
MCHC: 34.5 g/dL (ref 31.5–35.7)
MCV: 92 fL (ref 79–97)
Monocytes Absolute: 0.5 10*3/uL (ref 0.1–0.9)
Monocytes: 7 %
Neutrophils Absolute: 5 10*3/uL (ref 1.4–7.0)
Neutrophils: 70 %
Platelets: 156 10*3/uL (ref 150–450)
RBC: 5.8 x10E6/uL (ref 4.14–5.80)
RDW: 13.1 % (ref 11.6–15.4)
WBC: 7.2 10*3/uL (ref 3.4–10.8)

## 2018-09-16 LAB — LIPID PANEL
Chol/HDL Ratio: 6 ratio — ABNORMAL HIGH (ref 0.0–5.0)
Cholesterol, Total: 233 mg/dL — ABNORMAL HIGH (ref 100–199)
HDL: 39 mg/dL — ABNORMAL LOW (ref >39)
LDL Calculated: 172 mg/dL — ABNORMAL HIGH (ref 0–99)
Triglycerides: 110 mg/dL (ref 0–149)
VLDL Cholesterol Cal: 22 mg/dL (ref 5–40)

## 2018-09-16 LAB — VITAMIN B12: Vitamin B-12: 301 pg/mL (ref 232–1245)

## 2018-09-22 DIAGNOSIS — F4322 Adjustment disorder with anxiety: Secondary | ICD-10-CM | POA: Diagnosis not present

## 2018-09-28 ENCOUNTER — Telehealth: Payer: Self-pay | Admitting: Family Medicine

## 2018-09-28 NOTE — Telephone Encounter (Signed)
Copied from [place] [id number]. Topic: General - Other >> Sep 28, 2018  3:24 PM Yvette Rack wrote: Reason for CRM: Pt stated he is having diarrhea. Pt stated he knows that c diff is highly contagious and he probably should not come in to the office. Offered to schedule appt. Pt requests to be contacted to discuss possibly getting an antibiotic for c diff sent to his pharmacy. Pt stated if the doctor feels he should come in to the office then someone can contact him to schedule an appt

## 2018-09-29 ENCOUNTER — Ambulatory Visit: Payer: Self-pay | Admitting: *Deleted

## 2018-09-29 ENCOUNTER — Observation Stay (HOSPITAL_COMMUNITY)
Admission: EM | Admit: 2018-09-29 | Discharge: 2018-09-29 | Disposition: A | Discharge status: Home / Self Care | Payer: BLUE CROSS/BLUE SHIELD | Attending: Internal Medicine | Admitting: Internal Medicine

## 2018-09-29 ENCOUNTER — Encounter (HOSPITAL_COMMUNITY): Payer: Self-pay | Admitting: *Deleted

## 2018-09-29 ENCOUNTER — Other Ambulatory Visit: Payer: Self-pay

## 2018-09-29 DIAGNOSIS — Z79899 Other long term (current) drug therapy: Secondary | ICD-10-CM | POA: Diagnosis not present

## 2018-09-29 DIAGNOSIS — F1320 Sedative, hypnotic or anxiolytic dependence, uncomplicated: Secondary | ICD-10-CM | POA: Diagnosis present

## 2018-09-29 DIAGNOSIS — Z95810 Presence of automatic (implantable) cardiac defibrillator: Secondary | ICD-10-CM | POA: Diagnosis not present

## 2018-09-29 DIAGNOSIS — F32A Depression, unspecified: Secondary | ICD-10-CM | POA: Diagnosis present

## 2018-09-29 DIAGNOSIS — Z888 Allergy status to other drugs, medicaments and biological substances status: Secondary | ICD-10-CM | POA: Diagnosis not present

## 2018-09-29 DIAGNOSIS — J9621 Acute and chronic respiratory failure with hypoxia: Secondary | ICD-10-CM | POA: Diagnosis not present

## 2018-09-29 DIAGNOSIS — T402X1A Poisoning by other opioids, accidental (unintentional), initial encounter: Secondary | ICD-10-CM | POA: Diagnosis not present

## 2018-09-29 DIAGNOSIS — Z955 Presence of coronary angioplasty implant and graft: Secondary | ICD-10-CM | POA: Insufficient documentation

## 2018-09-29 DIAGNOSIS — R197 Diarrhea, unspecified: Secondary | ICD-10-CM | POA: Diagnosis not present

## 2018-09-29 DIAGNOSIS — F329 Major depressive disorder, single episode, unspecified: Secondary | ICD-10-CM | POA: Insufficient documentation

## 2018-09-29 DIAGNOSIS — R4182 Altered mental status, unspecified: Secondary | ICD-10-CM | POA: Diagnosis not present

## 2018-09-29 DIAGNOSIS — G4733 Obstructive sleep apnea (adult) (pediatric): Secondary | ICD-10-CM | POA: Insufficient documentation

## 2018-09-29 DIAGNOSIS — F419 Anxiety disorder, unspecified: Secondary | ICD-10-CM | POA: Diagnosis present

## 2018-09-29 DIAGNOSIS — Z7902 Long term (current) use of antithrombotics/antiplatelets: Secondary | ICD-10-CM | POA: Insufficient documentation

## 2018-09-29 DIAGNOSIS — F1120 Opioid dependence, uncomplicated: Secondary | ICD-10-CM | POA: Diagnosis not present

## 2018-09-29 DIAGNOSIS — F17210 Nicotine dependence, cigarettes, uncomplicated: Secondary | ICD-10-CM | POA: Insufficient documentation

## 2018-09-29 DIAGNOSIS — I739 Peripheral vascular disease, unspecified: Secondary | ICD-10-CM | POA: Diagnosis not present

## 2018-09-29 DIAGNOSIS — J449 Chronic obstructive pulmonary disease, unspecified: Secondary | ICD-10-CM | POA: Diagnosis not present

## 2018-09-29 DIAGNOSIS — F410 Panic disorder [episodic paroxysmal anxiety] without agoraphobia: Secondary | ICD-10-CM | POA: Insufficient documentation

## 2018-09-29 DIAGNOSIS — I5042 Chronic combined systolic (congestive) and diastolic (congestive) heart failure: Secondary | ICD-10-CM | POA: Insufficient documentation

## 2018-09-29 DIAGNOSIS — G894 Chronic pain syndrome: Secondary | ICD-10-CM | POA: Diagnosis not present

## 2018-09-29 DIAGNOSIS — Z8249 Family history of ischemic heart disease and other diseases of the circulatory system: Secondary | ICD-10-CM | POA: Diagnosis not present

## 2018-09-29 DIAGNOSIS — J9600 Acute respiratory failure, unspecified whether with hypoxia or hypercapnia: Secondary | ICD-10-CM | POA: Diagnosis present

## 2018-09-29 DIAGNOSIS — I255 Ischemic cardiomyopathy: Secondary | ICD-10-CM | POA: Diagnosis present

## 2018-09-29 DIAGNOSIS — F411 Generalized anxiety disorder: Secondary | ICD-10-CM | POA: Insufficient documentation

## 2018-09-29 DIAGNOSIS — I2510 Atherosclerotic heart disease of native coronary artery without angina pectoris: Secondary | ICD-10-CM | POA: Insufficient documentation

## 2018-09-29 DIAGNOSIS — T50901A Poisoning by unspecified drugs, medicaments and biological substances, accidental (unintentional), initial encounter: Secondary | ICD-10-CM | POA: Diagnosis present

## 2018-09-29 DIAGNOSIS — Z8547 Personal history of malignant neoplasm of testis: Secondary | ICD-10-CM

## 2018-09-29 LAB — URINALYSIS, ROUTINE W REFLEX MICROSCOPIC
Bacteria, UA: NONE SEEN
Bilirubin Urine: NEGATIVE
Glucose, UA: NEGATIVE mg/dL
Ketones, ur: NEGATIVE mg/dL
Nitrite: NEGATIVE
Protein, ur: NEGATIVE mg/dL
Specific Gravity, Urine: 1.013 (ref 1.005–1.030)
pH: 6 (ref 5.0–8.0)

## 2018-09-29 LAB — RAPID URINE DRUG SCREEN, HOSP PERFORMED
Amphetamines: NOT DETECTED
Barbiturates: NOT DETECTED
Benzodiazepines: POSITIVE — AB
Cocaine: NOT DETECTED
Opiates: NOT DETECTED
Tetrahydrocannabinol: POSITIVE — AB

## 2018-09-29 LAB — CBC WITH DIFFERENTIAL/PLATELET
Abs Immature Granulocytes: 0.02 10*3/uL (ref 0.00–0.07)
Basophils Absolute: 0 10*3/uL (ref 0.0–0.1)
Basophils Relative: 0 %
Eosinophils Absolute: 0.5 10*3/uL (ref 0.0–0.5)
Eosinophils Relative: 7 %
HCT: 50.1 % (ref 39.0–52.0)
Hemoglobin: 16.5 g/dL (ref 13.0–17.0)
Immature Granulocytes: 0 %
Lymphocytes Relative: 27 %
Lymphs Abs: 1.8 10*3/uL (ref 0.7–4.0)
MCH: 31.1 pg (ref 26.0–34.0)
MCHC: 32.9 g/dL (ref 30.0–36.0)
MCV: 94.4 fL (ref 80.0–100.0)
Monocytes Absolute: 0.4 10*3/uL (ref 0.1–1.0)
Monocytes Relative: 6 %
Neutro Abs: 4.2 10*3/uL (ref 1.7–7.7)
Neutrophils Relative %: 60 %
Platelets: 159 10*3/uL (ref 150–400)
RBC: 5.31 MIL/uL (ref 4.22–5.81)
RDW: 13.2 % (ref 11.5–15.5)
WBC: 6.9 10*3/uL (ref 4.0–10.5)
nRBC: 0 % (ref 0.0–0.2)

## 2018-09-29 LAB — COMPREHENSIVE METABOLIC PANEL
ALT: 11 U/L (ref 0–44)
AST: 14 U/L — ABNORMAL LOW (ref 15–41)
Albumin: 3.8 g/dL (ref 3.5–5.0)
Alkaline Phosphatase: 83 U/L (ref 38–126)
Anion gap: 9 (ref 5–15)
BUN: 11 mg/dL (ref 6–20)
CO2: 27 mmol/L (ref 22–32)
Calcium: 9 mg/dL (ref 8.9–10.3)
Chloride: 102 mmol/L (ref 98–111)
Creatinine, Ser: 0.98 mg/dL (ref 0.61–1.24)
GFR calc Af Amer: >60 mL/min (ref >60)
GFR calc non Af Amer: >60 mL/min (ref >60)
Glucose, Bld: 114 mg/dL — ABNORMAL HIGH (ref 70–99)
Potassium: 3.8 mmol/L (ref 3.5–5.1)
Sodium: 138 mmol/L (ref 135–145)
Total Bilirubin: 1.2 mg/dL (ref 0.3–1.2)
Total Protein: 7.3 g/dL (ref 6.5–8.1)

## 2018-09-29 LAB — MAGNESIUM: Magnesium: 1.9 mg/dL (ref 1.7–2.4)

## 2018-09-29 LAB — ACETAMINOPHEN LEVEL: Acetaminophen (Tylenol), Serum: <10 ug/mL — ABNORMAL LOW (ref 10–30)

## 2018-09-29 LAB — LIPASE, BLOOD: Lipase: 26 U/L (ref 11–51)

## 2018-09-29 LAB — SALICYLATE LEVEL: Salicylate Lvl: <7 mg/dL (ref 2.8–30.0)

## 2018-09-29 LAB — CBG MONITORING, ED: Glucose-Capillary: 79 mg/dL (ref 70–99)

## 2018-09-29 LAB — ETHANOL: Alcohol, Ethyl (B): <10 mg/dL (ref <10)

## 2018-09-29 MED ORDER — NALOXONE HCL 2 MG/2ML IJ SOSY
2.0000 mg | PREFILLED_SYRINGE | Freq: Once | INTRAMUSCULAR | Status: AC
  Administered 2018-09-29: 0.4 mg via INTRAVENOUS
  Filled 2018-09-29: qty 2

## 2018-09-29 MED ORDER — NALOXONE HCL 4 MG/10ML IJ SOLN
0.2500 mg/h | INTRAVENOUS | Status: DC
  Administered 2018-09-29: 0.25 mg/h via INTRAVENOUS
  Filled 2018-09-29: qty 10

## 2018-09-29 MED ORDER — NALOXONE HCL 0.4 MG/ML IJ SOLN
0.4000 mg | Freq: Once | INTRAMUSCULAR | Status: AC
  Administered 2018-09-29: 0.4 mg via INTRAVENOUS
  Filled 2018-09-29: qty 1

## 2018-09-29 NOTE — ED Provider Notes (Cosign Needed)
[facility] DEPT Provider Note   CSN: [id number] Arrival date & time: 09/29/18  1533    History   Chief Complaint Chief Complaint  Patient presents with  . Ingestion    HPI [name] is a 58 y.o. male with a past medical history of chronic benzodiazepine and narcotic dependence, CAD, CHF, AICD in place, who presents today for evaluation of ingestion.  History is obtained from patient's friend.  Patient reportedly took 90 mL of liquid that he thought was his methadone, however reports concerned that it may have been watered-down GHB.  According to triage staff he is conversant.  He reportedly contacted poison control on the way to the ED.  At the time of my evaluation patient is nonverbal and unable to provide further history.     HPI  Past Medical History:  Diagnosis Date  . Anxiety   . Automatic implantable cardiac defibrillator [employer]    Analyze ST  . Benign prostatic hypertrophy   . Benzodiazepine dependence (HCC)    chronic  . CAD (coronary artery disease)    Last cath 2/12. 3-v CAD. Failed PCI of distal RCAc  CATH [facility] 4/13 with DES to  LAD X2  . CHF (congestive heart failure) ([place])   . Chronic back pain    lumbar stenosis  . Chronic systolic heart failure (HCC)    EF 20-25%. s/p ST. Jude ICD  . Depression   . DJD (degenerative joint disease)   . History of testicular cancer   . Narcotic dependence (HCC)    chronic  . Polycythemia, secondary    S/p hematology consultation for polycythema on 01/06/18.  S/p bone marrow biopsy on 12/31/17 that was negative for myeloproliferative disorder.  Feels secondary to sleep apnea, active smoking, testosterone supplementation, nocturnal hypoxia  . Vitamin B12 deficiency 07/22/2016    Patient Active Problem List   Diagnosis Date Noted  . Polycythemia, secondary   . Chronic nausea 08/05/2018  . Cough 03/15/2018  . Right lower lobe pneumonia ([place]) 03/15/2018  . Tobacco dependence 03/15/2018   . Marijuana use, episodic 03/15/2018  . Hypoxia 03/11/2018  . Acute respiratory failure with hypoxia ([place]) 03/11/2018  . Acute on chronic congestive heart failure ([place])   . Hypogonadism in male 01/16/2018  . Vitamin B12 deficiency 01/16/2018  . Central sleep apnea 07/27/2017  . Treatment-emergent central sleep apnea 07/27/2017  . Chronic respiratory failure with hypoxia ([place]) 07/27/2017  . COPD  GOLD 0 01/02/2017  . Hemoptysis 01/01/2017  . Memory difficulty 07/22/2016  . Constipation 09/04/2014  . Unstable angina ([place]) 06/14/2014  . Therapeutic opioid induced constipation 04/02/2014  . Hypoxemia 09/25/2013  . Erythrocytosis 09/18/2013  . Erectile dysfunction 11/24/2012  . 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 MAI (mycobacterium avium-intracellulare) ([place]) 05/16/2012  . 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 PAD (peripheral artery disease) ([place]) 02/24/2011  . Dual implantable cardioverter-defibrillator in situ 12/24/2010  . Depression 12/09/2010  . cHistory of testicular cancer 12/09/2010  . Benzodiazepine dependence ([place]) 12/09/2010  . Narcotic dependence ([place]) 12/09/2010  . Chronic back pain 12/05/2010  . Anxiety disorder 10/17/2010  . CAD, NATIVE VESSEL 09/15/2010  . COMBINED HEART FAILURE, CHRONIC 09/15/2010  . CARDIOMYOPATHY, ISCHEMIC 09/12/2010    Past Surgical History:  Procedure Laterality Date  . ASD REPAIR, SINUS VENOSUS    . CARDIAC DEFIBRILLATOR PLACEMENT  08/2010  . HERNIA REPAIR    . LEFT HEART  CATHETERIZATION WITH CORONARY ANGIOGRAM N/A 06/14/2014   Procedure: LEFT HEART CATHETERIZATION WITH CORONARY ANGIOGRAM;  Surgeon: Jolaine Artist, MD;  Location[facility] CATH LAB;  Service: Cardiovascular;  Laterality: N/A;  . TESTICLE SURGERY     testicular cancer surgery left testicle removed        Home Medications    Prior to Admission medications   Medication Sig Start Date End  Date Taking? Authorizing Provider  albuterol (VENTOLIN HFA) 108 (90 Base) MCG/ACT inhaler Inhale 2 puffs into the lungs every 6 (six) hours as needed for wheezing or shortness of breath. 03/14/18   Bloomfield, Carley D, DO  alfuzosin (UROXATRAL) 10 MG 24 hr tablet Take 10 mg by mouth as needed. For UTI 03/17/13   [provider]  ALPRAZolam Duanne Moron) 1 MG tablet Take 1 mg by mouth 4 (four) times daily.     [provider]  carvedilol (COREG) 25 MG tablet TAKE 1 TABLET BY MOUTH TWICE DAILY WITH A MEAL. 04/14/18   Bensimhon, Shaune Pascal, MD  clopidogrel (PLAVIX) 75 MG tablet Take 1 tablet (75 mg total) by mouth daily. Pt needs to schedule an appt with our office at [phone] for future refills. 04/12/18   Bensimhon, Shaune Pascal, MD  cyanocobalamin (,VITAMIN B-12,) 1000 MCG/ML injection Inject 1 mL (1,000 mcg total) into the muscle once a week. For 4 weeks and then once per month 02/04/18   Wardell Honour, MD  diazepam (VALIUM) 10 MG tablet Take 10 mg by mouth at bedtime as needed for anxiety.  06/04/17   [provider]  ezetimibe (ZETIA) 10 MG tablet Take 1 tablet (10 mg total) by mouth daily. 04/12/18   Bensimhon, Shaune Pascal, MD  gabapentin (NEURONTIN) 300 MG capsule Take 3 capsules (900 mg total) by mouth 3 (three) times daily. 01/12/18   Wardell Honour, MD  lactulose (CHRONULAC) 10 GM/15ML solution Take 15 mLs (10 g total) by mouth 2 (two) times daily as needed for mild constipation. 08/05/18   Horald Pollen, MD  lisinopril (PRINIVIL,ZESTRIL) 5 MG tablet  07/20/18   [provider]  methadone (METHADOSE) 40 MG disintegrating tablet Take 200 mg by mouth daily.    [provider]  nitroGLYCERIN (NITROLINGUAL) 0.4 MG/SPRAY spray Place under the tongue. 03/20/15   [provider]  ondansetron (ZOFRAN-ODT) 8 MG disintegrating tablet Take 1 tablet (8 mg total) by mouth every 8 (eight) hours as needed for nausea or vomiting. 08/05/18   Horald Pollen, MD   polyethylene glycol powder (GLYCOLAX/MIRALAX) powder MIX 1 CAPFUL TWICE DAILY AS NEEDED. 06/15/15   Copland, [name] Filler, MD  rosuvastatin (CRESTOR) 40 MG tablet Take 1 tablet (40 mg total) by mouth every evening. 04/12/18   Bensimhon, Shaune Pascal, MD  sildenafil (REVATIO) 20 MG tablet TAKE 1 TABLET BY MOUTH 3 TIMES A DAY. 08/25/18   [provider]  spironolactone (ALDACTONE) 25 MG tablet Take 1 tablet (25 mg total) by mouth daily. TAKE (1/2) TABLET DAILY. 04/12/18   Bensimhon, Shaune Pascal, MD  sucralfate (CARAFATE) 1 g tablet Take 1 tablet (1 g total) by mouth 4 (four) times daily -  with meals and at bedtime. 03/14/18   Bloomfield, Carley D, DO  tamsulosin (FLOMAX) 0.4 MG CAPS capsule Take 1 capsule (0.4 mg total) by mouth daily after breakfast. 11/22/17   Wardell Honour, MD  torsemide (DEMADEX) 20 MG tablet Take 1 tablet (20 mg total) by mouth daily as needed. For swelling in legs 10/07/15   Bensimhon,  Shaune Pascal, MD  traZODone (DESYREL) 100 MG tablet Take 50 mg by mouth at bedtime as needed for sleep.     [provider]  triamcinolone (NASACORT AQ) 55 MCG/ACT AERO nasal inhaler Place 1 spray into the nose daily. 04/24/16   Chesley Mires, MD  VIAGRA 100 MG tablet TAKE 1 TABLET APPROXIMATELY 1 HOUR BEFORE NEEDING AS DIRECTED. Patient taking differently: Take 100 mg by mouth as needed for erectile dysfunction.  07/24/16   Bensimhon, Shaune Pascal, MD  zolpidem Lorrin Mais) 10 MG tablet  07/11/18   [provider]    Family History Family History  Problem Relation Age of Onset  . Heart disease Father 22       Died of MI  . Cancer Mother     Social History Social History   Tobacco Use  . Smoking status: Current Every Day Smoker    Packs/day: 0.50    Years: 29.00    Pack years: 14.50    Types: Cigarettes, E-cigarettes    Last attempt to quit: 07/14/2015    Years since quitting: 3.2  . Smokeless tobacco: Never Used  . Tobacco comment: no cigarette in 4 days but yes to e-cig  Substance  Use Topics  . Alcohol use: No    Alcohol/week: 0.0 standard drinks  . Drug use: Yes    Types: Marijuana    Comment: Urine showed THC     Allergies   Darvocet [propoxyphene n-acetaminophen] and Propoxyphene   Review of Systems Review of Systems  Unable to perform ROS: Mental status change     Physical Exam Updated Vital Signs BP (!) 144/68 (BP Location: Right Arm)   Pulse 66   Temp 98.6 F (37 C)   Resp 16   SpO2 99%   Physical Exam Vitals signs and nursing note reviewed.  Constitutional:      Appearance: He is well-developed. He is diaphoretic.     Comments: Opens eyes to sternal rub  HENT:     Head: Normocephalic and atraumatic.  Eyes:     General: No scleral icterus.       Right eye: No discharge.        Left eye: No discharge.     Conjunctiva/sclera: Conjunctivae normal.     Comments: Pupils are pinpoint bilaterally  Neck:     Musculoskeletal: Full passive range of motion without pain and normal range of motion.  Cardiovascular:     Rate and Rhythm: Normal rate and regular rhythm.  Pulmonary:     Effort: Bradypnea present.     Comments: Requires sternal rub/pain stimulus to breathe, after narcan spontaneous respirations.  Abdominal:     General: There is no distension.  Musculoskeletal:        General: No deformity.  Skin:    General: Skin is warm and moist.     Coloration: Skin is pale.  Neurological:     Mental Status: He is lethargic.     Motor: No abnormal muscle tone.  Psychiatric:     Comments: Initially unable to assess.      ED Treatments / Results  Labs (all labs ordered are listed, but only abnormal results are displayed) Labs Reviewed  COMPREHENSIVE METABOLIC PANEL - Abnormal; Notable for the following components:      Result Value   Glucose, Bld 114 (*)    AST 14 (*)    All other components within normal limits  ACETAMINOPHEN LEVEL - Abnormal; Notable for the following components:   Acetaminophen (  Tylenol), Serum <10 (*)    All  other components within normal limits  CBC WITH DIFFERENTIAL/PLATELET  SALICYLATE LEVEL  LIPASE, BLOOD  MAGNESIUM  ETHANOL  URINALYSIS, ROUTINE W REFLEX MICROSCOPIC  RAPID URINE DRUG SCREEN, HOSP PERFORMED  CBG MONITORING, ED    EKG EKG Interpretation  Date/Time:  Thursday September 29 2018 16:12:15 EDT Ventricular Rate:  88 PR Interval:    QRS Duration: 112 QT Interval:  419 QTC Calculation: 507 R Axis:   76 Text Interpretation:  Sinus rhythm Borderline intraventricular conduction delay Nonspecific T abnormalities, inferior leads Prolonged QT interval Confirmed by Lacretia Leigh ([id number]) on 09/29/2018 4:18:46 PM   Radiology No results found.  Procedures .Critical Care Performed by: Lorin Glass, PA-C Authorized by: Lorin Glass, PA-C   Critical care provider statement:    Critical care time (minutes):  45   Critical care time was exclusive of:  Separately billable procedures and treating other patients and teaching time   Critical care was necessary to treat or prevent imminent or life-threatening deterioration of the following conditions:  Respiratory failure and toxidrome   Critical care was time spent personally by me on the following activities:  Discussions with consultants, evaluation of patient's response to treatment, examination of patient, ordering and performing treatments and interventions, ordering and review of laboratory studies, ordering and review of radiographic studies, pulse oximetry, re-evaluation of patient's condition, obtaining history from patient or surrogate and review of old charts Comments:     Narcan for insufficient respiratory effort x2, narcan drip   (including critical care time)   Medications Ordered in ED Medications  naloxone HCl (NARCAN) 4 mg in dextrose 5 % 250 mL infusion (0.25 mg/hr Intravenous New Bag/Given 09/29/18 1805)  naloxone [facility]) injection 0.4 mg (0.4 mg Intravenous Given 09/29/18 1605)  naloxone (NARCAN)  injection 2 mg (0.4 mg Intravenous Given 09/29/18 1739)     Initial Impression / Assessment and Plan / ED Course  I have reviewed the triage vital signs and the nursing notes.  Pertinent labs & imaging results that were available during my care of the patient were reviewed by me and considered in my medical decision making (see chart for details).  Clinical Course as of Sep 29 1811  Thu Sep 29, 2018  1615 Patient started to become apneic, was requiring painful stimulation to maintain respiratory drive.  Pupils were pinpoint.  0.4 mg Narcan was administered, after which he woke up and started moving.   [EH]  1645 Patient is breathing well, he is frequently saying help me but when ask him what he needs help with he just says help me.  He is in no obvious distress.  Will continue to monitor.    [EH]  2992 Patient was reevaluated.  He was very lethargic and sleepy.  He was placed on end-tidal and had inconsistent respiratory effort, he again was requiring painful stimulation to maintain respiratory drive.  Given narcan.  Is now ANO x4.  He is pleasant, conversant.   [EH]  1729 Patient denies any abdominal pain or chest pain.  Denies shortness of breath.   [EH]  1730 He has 5/5 strength in bilateral upper and lower extremities.  Speech is not slurred.  Neuro exam is normal.   [EH]  4268 Spoke with hosptialist.    [EH]    Clinical Course User Index [EH] Lorin Glass, PA-C      Patient presents today for evaluation of possible overdose.  Patient reportedly went to take  his methadone and may have accidentally taken "watered-down GHB" instead.  On arrival he rapidly became more somnolent requiring Narcan due to insufficient respiratory effort.  He required IV Narcan after which he woke up.  Labs are obtained and reviewed, CMP, CBC, lipase are unremarkable.  Ethanol, acetaminophen, salicylate levels are all undetectable.  EKG showed prolonged QT interval he was observed in the emergency  room, during which he again became obtunded with insufficient respiratory effort requiring a second dose of IV Narcan.  After this he was started on a Narcan infusion.  I spoke with the hospitalist who will admit the patient.    This patient was seen as a shared visit with Dr. Zenia Resides.  Final Clinical Impressions(s) / ED Diagnoses   Final diagnoses:  Accidental drug overdose, initial encounter  Opioid overdose, accidental or unintentional, initial encounter [facility])    ED Discharge Orders    None       Lorin Glass, [place] 09/29/18 1817

## 2018-09-29 NOTE — ED Notes (Signed)
Patient expressed concerns about narcan drip being that he is on methadone; MD notified about concerns and explained to patient importance of narcan drip. Education provided to patient and family member, but despite information provided, patient expresses that he would like to leave AMA.

## 2018-09-29 NOTE — ED Notes (Signed)
Spoke with Poison Control about status of patient after interventions.

## 2018-09-29 NOTE — ED Triage Notes (Signed)
Pt reports accidentally taking 90 ml of liquid GHB around 1525 today, thinking it was his methadone.  He states he can "feel it.  I am fading out."  He states his ex-wife might have taken his methadone and switched it with the GHB because they are the same color.  Pt's pupils are pin-point.  He contacted the poison control on the way to the ED.

## 2018-09-29 NOTE — ED Provider Notes (Signed)
Medical screening examination/treatment/procedure(s) were conducted as a shared visit with non-physician practitioner(s) and myself.  I personally evaluated the patient during the encounter.  EKG Interpretation  Date/Time:  Thursday September 29 2018 16:12:15 EDT Ventricular Rate:  88 PR Interval:    QRS Duration: 112 QT Interval:  419 QTC Calculation: 507 R Axis:   76 Text Interpretation:  Sinus rhythm Borderline intraventricular conduction delay Nonspecific T abnormalities, inferior leads Prolonged QT interval Confirmed by Lacretia Leigh ([id number]) on 09/29/2018 4:34:39 PM   58 year old male presents after possible overdose on methadone and GHB.  Patient given Narcan and improved.  Labs and work-up pending   Lacretia Leigh, MD 09/29/18 1620

## 2018-09-29 NOTE — H&P (Signed)
History and Physical    [name] YIF:[id number] DOB: 1960/09/29 DOA: 09/29/2018  PCP: System, Pcp Not In  Patient coming from: Home  I have personally briefly reviewed patient's old medical records in [place]  Chief Complaint: Altered mental status  HPI: [name] is a 58 y.o. male with medical history significant of ischemic cardiomyopathy, coronary artery disease/peripheral artery disease, OSA on nocturnal BiPAP with 5 L bleeding, chronic systolic congestive heart failure with a EF of 30% status post ICD, testicular cancer, narcotic/benzodiazepine dependence, COPD, secondary polycythemia who presents from home after unintentional ingestion.  Patient is accompanied by his "personal assistant" who contributes to the HPI.  Apparently, patient took his normal dose of methadone this morning and admitted to taking up to "90 mg of GHB"; which he thought was a "water down by his soon-to-be ex wife".  Right after taking this illicit substance, he reportedly started feeling "off"; and directed his "personal assistant" to take him to the ED.  Patient also reports that he has been experiencing significant nausea and diarrhea over the past 4 days with poor oral intake.  He reports a history of C. difficile colitis secondary to antibiotic use; although when asked he has not had any recent antibiotic exposures or sick contacts.  Patient currently denies headaches, no fever/chills/night sweats, no vomiting, no chest pain, no palpitations, no sore throat, no visual changes, no hallucinations, no abdominal pain, no weakness, no issues with bladder function, no cough/congestion, no rashes, and no paresthesias.  ED Course: Upon initial evaluation in the ED, he was noted by ED provider to be somnolent with decreased alertness and decreased respiratory drive.  Temperature 98.6, HR 66, RR 16, BP 144/68, SPO2 99% on room air.  WBC 6.9, hemoglobin 16.5, platelets 159.  Sodium 138, potassium 3.8,  chloride 102, CO2 27, BUN 11, creatinine 0.98, glucose 114.  Lipase 26, AST 14, ALT 11, Tylenol less than 10, salicylate less than 7.0, EtOH less than 10.  UDS positive for benzodiazepines and DHB.  Patient's was given Narcan IV with reversal of symptoms, although did require a second dose for deterioration and was subsequently placed on a Narcan drip.  Tried hospitalist service was consulted for observation given his ingestion of [place] requiring multiple rounds of Narcan reversal and to monitor for possible seizure activity and QT prolongation.  Patient was adamant about not being on a Narcan drip, and states " I will not detox from my underlying narcotics/benzos" as this will make me "extremely sick".  Discussed with patient that it is our in the ED provider's recommendation that he needs further observation due to his ingestion as above.  Discussed with patient that he is alert and oriented and able to make his own medical decisions at this time and if he desired to discharge it would be [place] at this point.  Review of Systems: As per HPI otherwise 10 point review of systems negative.   Past Medical History:  Diagnosis Date  . Anxiety   . Automatic implantable cardiac defibrillator [employer]    Analyze ST  . Benign prostatic hypertrophy   . Benzodiazepine dependence (HCC)    chronic  . CAD (coronary artery disease)    Last cath 2/12. 3-v CAD. Failed PCI of distal RCAc  CATH [facility] 4/13 with DES to  LAD X2  . CHF (congestive heart failure) ([place])   . Chronic back pain    lumbar stenosis  . Chronic systolic heart failure ([place])  EF 20-25%. s/p ST. Jude ICD  . Depression   . DJD (degenerative joint disease)   . History of testicular cancer   . Narcotic dependence (HCC)    chronic  . Polycythemia, secondary    S/p hematology consultation for polycythema on 01/06/18.  S/p bone marrow biopsy on 12/31/17 that was negative for myeloproliferative disorder.  Feels secondary to sleep  apnea, active smoking, testosterone supplementation, nocturnal hypoxia  . Vitamin B12 deficiency 07/22/2016    Past Surgical History:  Procedure Laterality Date  . ASD REPAIR, SINUS VENOSUS    . CARDIAC DEFIBRILLATOR PLACEMENT  08/2010  . HERNIA REPAIR    . LEFT HEART CATHETERIZATION WITH CORONARY ANGIOGRAM N/A 06/14/2014   Procedure: LEFT HEART CATHETERIZATION WITH CORONARY ANGIOGRAM;  Surgeon: Jolaine Artist, MD;  Location: [facility] CATH LAB;  Service: Cardiovascular;  Laterality: N/A;  . TESTICLE SURGERY     testicular cancer surgery left testicle removed     reports that he has been smoking cigarettes and e-cigarettes. He has a 14.50 pack-year smoking history. He has never used smokeless tobacco. He reports current drug use. Drug: Marijuana. He reports that he does not drink alcohol.  Allergies  Allergen Reactions  . Darvocet [Propoxyphene N-Acetaminophen] Anaphylaxis    Throat closes  . Propoxyphene Anaphylaxis and Swelling    Family History  Problem Relation Age of Onset  . Heart disease Father 58       Died of MI  . Cancer Mother     Family history reviewed and not pertinent   Prior to Admission medications   Medication Sig Start Date End Date Taking? Authorizing Provider  albuterol (VENTOLIN HFA) 108 (90 Base) MCG/ACT inhaler Inhale 2 puffs into the lungs every 6 (six) hours as needed for wheezing or shortness of breath. 03/14/18   Bloomfield, Carley D, DO  alfuzosin (UROXATRAL) 10 MG 24 hr tablet Take 10 mg by mouth as needed. For UTI 03/17/13   [provider]  ALPRAZolam Duanne Moron) 1 MG tablet Take 1 mg by mouth 4 (four) times daily.     [provider]  carvedilol (COREG) 25 MG tablet TAKE 1 TABLET BY MOUTH TWICE DAILY WITH A MEAL. 04/14/18   Bensimhon, Shaune Pascal, MD  clopidogrel (PLAVIX) 75 MG tablet Take 1 tablet (75 mg total) by mouth daily. Pt needs to schedule an appt with our office at [phone] for future refills. 04/12/18   Bensimhon, Shaune Pascal, MD   cyanocobalamin (,VITAMIN B-12,) 1000 MCG/ML injection Inject 1 mL (1,000 mcg total) into the muscle once a week. For 4 weeks and then once per month 02/04/18   Wardell Honour, MD  diazepam (VALIUM) 10 MG tablet Take 10 mg by mouth at bedtime as needed for anxiety.  06/04/17   [provider]  ezetimibe (ZETIA) 10 MG tablet Take 1 tablet (10 mg total) by mouth daily. 04/12/18   Bensimhon, Shaune Pascal, MD  gabapentin (NEURONTIN) 300 MG capsule Take 3 capsules (900 mg total) by mouth 3 (three) times daily. 01/12/18   Wardell Honour, MD  lactulose (CHRONULAC) 10 GM/15ML solution Take 15 mLs (10 g total) by mouth 2 (two) times daily as needed for mild constipation. 08/05/18   Horald Pollen, MD  lisinopril (PRINIVIL,ZESTRIL) 5 MG tablet  07/20/18   [provider]  methadone (METHADOSE) 40 MG disintegrating tablet Take 200 mg by mouth daily.    [provider]  nitroGLYCERIN (NITROLINGUAL) 0.4 MG/SPRAY spray Place under the tongue. 03/20/15   [provider]  ondansetron (ZOFRAN-ODT) 8 MG disintegrating tablet Take 1 tablet (8 mg total) by mouth every 8 (eight) hours as needed for nausea or vomiting. 08/05/18   Sagardia, Ines Bloomer, MD  polyethylene glycol powder (GLYCOLAX/MIRALAX) powder MIX 1 CAPFUL TWICE DAILY AS NEEDED. 06/15/15   Copland, [name] Filler, MD  rosuvastatin (CRESTOR) 40 MG tablet Take 1 tablet (40 mg total) by mouth every evening. 04/12/18   Bensimhon, Shaune Pascal, MD  sildenafil (REVATIO) 20 MG tablet TAKE 1 TABLET BY MOUTH 3 TIMES A DAY. 08/25/18   [provider]  spironolactone (ALDACTONE) 25 MG tablet Take 1 tablet (25 mg total) by mouth daily. TAKE (1/2) TABLET DAILY. 04/12/18   Bensimhon, Shaune Pascal, MD  sucralfate (CARAFATE) 1 g tablet Take 1 tablet (1 g total) by mouth 4 (four) times daily -  with meals and at bedtime. 03/14/18   Bloomfield, Carley D, DO  tamsulosin (FLOMAX) 0.4 MG CAPS capsule Take 1 capsule (0.4 mg total) by mouth daily after  breakfast. 11/22/17   Wardell Honour, MD  torsemide (DEMADEX) 20 MG tablet Take 1 tablet (20 mg total) by mouth daily as needed. For swelling in legs 10/07/15   Bensimhon, Shaune Pascal, MD  traZODone (DESYREL) 100 MG tablet Take 50 mg by mouth at bedtime as needed for sleep.     [provider]  triamcinolone (NASACORT AQ) 55 MCG/ACT AERO nasal inhaler Place 1 spray into the nose daily. 04/24/16   Chesley Mires, MD  VIAGRA 100 MG tablet TAKE 1 TABLET APPROXIMATELY 1 HOUR BEFORE NEEDING AS DIRECTED. Patient taking differently: Take 100 mg by mouth as needed for erectile dysfunction.  07/24/16   Bensimhon, Shaune Pascal, MD  zolpidem (AMBIEN) 10 MG tablet  07/11/18   [provider]    Physical Exam: Vitals:   09/29/18 1538  BP: (!) 144/68  Pulse: 66  Resp: 16  Temp: 98.6 F (37 C)  SpO2: 99%    Constitutional: NAD, calm, comfortable Vitals:   09/29/18 1538  BP: (!) 144/68  Pulse: 66  Resp: 16  Temp: 98.6 F (37 C)  SpO2: 99%   Eyes: PERRL, lids and conjunctivae normal ENMT: Mucous membranes are moist. Posterior pharynx clear of any exudate or lesions.Normal dentition.  Neck: normal, supple, no masses, no thyromegaly Respiratory: clear to auscultation bilaterally, no wheezing, no crackles. Normal respiratory effort. No accessory muscle use.  Cardiovascular: Regular rate and rhythm, no murmurs / rubs / gallops. No extremity edema. 2+ pedal pulses. No carotid bruits.  Abdomen: no tenderness, no masses palpated. No hepatosplenomegaly. Bowel sounds positive.  Musculoskeletal: no clubbing / cyanosis. No joint deformity upper and lower extremities. Good ROM, no contractures. Normal muscle tone.  Skin: no rashes, lesions, ulcers. No induration Neurologic: CN 2-12 grossly intact. Sensation intact, DTR normal. Strength 5/5 in all 4.  Psychiatric: Normal judgment and insight. Alert and oriented x 3. Normal mood.   Labs on Admission: I have personally reviewed following labs and  imaging studies  CBC: Recent Labs  Lab 09/29/18 1642  WBC 6.9  NEUTROABS 4.2  HGB 16.5  HCT 50.1  MCV 94.4  PLT 884   Basic Metabolic Panel: Recent Labs  Lab 09/29/18 1642  NA 138  K 3.8  CL 102  CO2 27  GLUCOSE 114*  BUN 11  CREATININE 0.98  CALCIUM 9.0  MG 1.9   GFR: CrCl cannot be calculated (Unknown ideal weight.). Liver Function Tests: Recent Labs  Lab 09/29/18 1642  AST 14*  ALT 11  ALKPHOS 83  BILITOT 1.2  PROT 7.3  ALBUMIN 3.8   Recent Labs  Lab 09/29/18 1642  LIPASE 26   No results for input(s): AMMONIA in the last 168 hours. Coagulation Profile: No results for input(s): INR, PROTIME in the last 168 hours. Cardiac Enzymes: No results for input(s): CKTOTAL, CKMB, CKMBINDEX, TROPONINI in the last 168 hours. BNP (last 3 results) No results for input(s): PROBNP in the last 8760 hours. HbA1C: No results for input(s): HGBA1C in the last 72 hours. CBG: Recent Labs  Lab 09/29/18 1630  GLUCAP 79   Lipid Profile: No results for input(s): CHOL, HDL, LDLCALC, TRIG, CHOLHDL, LDLDIRECT in the last 72 hours. Thyroid Function Tests: No results for input(s): TSH, T4TOTAL, FREET4, T3FREE, THYROIDAB in the last 72 hours. Anemia Panel: No results for input(s): VITAMINB12, FOLATE, FERRITIN, TIBC, IRON, RETICCTPCT in the last 72 hours. Urine analysis:    Component Value Date/Time   COLORURINE YELLOW 10/15/2010 1311   APPEARANCEUR CLEAR 10/15/2010 1311   LABSPEC 1.026 10/15/2010 1311   PHURINE 5.5 10/15/2010 1311   GLUCOSEU NEGATIVE 10/15/2010 1311   HGBUR MODERATE (A) 10/15/2010 1311   BILIRUBINUR negative 11/24/2017 1807   BILIRUBINUR mod 01/30/2015 1512   KETONESUR negative 11/24/2017 1807   KETONESUR NEGATIVE 10/15/2010 1311   PROTEINUR negative 11/24/2017 1807   PROTEINUR neg 01/30/2015 1512   PROTEINUR NEGATIVE 10/15/2010 1311   UROBILINOGEN 0.2 11/24/2017 1807   UROBILINOGEN 0.2 10/15/2010 1311   NITRITE Negative 11/24/2017 1807   NITRITE  neg 01/30/2015 1512   NITRITE NEGATIVE 10/15/2010 1311   LEUKOCYTESUR Negative 11/24/2017 1807    Radiological Exams on Admission: No results found.  EKG: Independently reviewed.  NSR with rate 88 with QTC prolongation of 507 with nonspecific T wave changes inferior leads not significantly changed from previous EKGs reviewed and E HR.  Assessment/Plan Principal Problem:   Acute on chronic respiratory failure with hypoxia (HCC) Active Problems:   Ischemic cardiomyopathy   Anxiety disorder   Depression   cHistory of testicular cancer   Benzodiazepine dependence (HCC)   Narcotic dependence ([place])   Dual implantable cardioverter-defibrillator in situ   Acute respiratory failure ([place])   Drug overdose, accidental or unintentional, initial encounter   Acute on chronic hypoxic respiratory failure Unintentional drug overdose; with [facility] Patient presenting from home after ingesting reported 90 mg of GHB.  Patient with history of significant opiate/benzo dependence on methadone, Xanax, and Valium.  UDS was positive for [facility] and benzos.  Patient was somnolent with decreased respiratory drive noted in ED status post 2 rounds of IV Narcan and subsequently placed on a Narcan drip.  ED provider discussed case with poison control who recommended observation admission with serial EKGs to evaluate for QT prolongation, and monitoring of neuro status for possible seizure activity. --Admit to SCU --Continue Narcan drip --Hold home sedatives to include methadone, Valium, Xanax, trazodone --EKG every 6 hours --Neurochecks every 4 hours --Continue to monitor on telemetry --Supportive care  Diarrhea Patient reports history of persistent diarrhea over the past 4 days.  Also reports history of C. difficile colitis; and states feels this is relatively similar as the past.  No recent antibiotic exposures or sick contacts. --Check C. difficile and GI PCR --Enteric precautions --Recommend GI outpatient  evaluation if above unremarkable  Combined chronic systolic/diastolic congestive heart failure Patient followed by cardiology outpatient.  Not in acute exacerbation.  Status post ICD placement.  Previous echo 03/12/2018 notable for EF 30-35%.  Follows with  [facility]. --Continue home Coreg, lisinopril, Spironolactone --Patient on torsemide 20 mg daily as needed at home, will hold for now --Daily weights, strict I's and O's  CAD/PAD --Continue home Plavix  OSA --Continue home BiPAP with 5 L O2 bleed in --RT evaluation to follow during hospital course  Chronic pain syndrome --Holding home methadone secondary to overdose as above  GAD/panic disorder --Holding home Valium and Xanax as above for somnolence requiring Narcan  DVT prophylaxis: Lovenox Code Status: Full code Family Communication: No immediate family at bedside, discussed with his "personal assistant" at bedside Disposition Plan: Admit to SCU under observation with a Narcan drip Consults called: None Admission status: Observation   Vartan Kerins J [place] DO [facility] Pager [phone]  If 7PM-7AM, please contact night-coverage [email or web address] Password [facility]  09/29/2018, 6:37 PM

## 2018-09-29 NOTE — ED Notes (Signed)
Called poison control. They suggest monitoring for seizures and providing supportive care for GHB overdose. They recommend repeating EKG in 2 hours. They suggest observing patient for 6-8 hours.

## 2018-09-29 NOTE — ED Notes (Addendum)
Poison Control requests EKG in 2-6 hours. If QRS is prolonged give Sodium Bicarb pushes, if QTC prolonged check magnesium and potassium level to keep at high end of normal. Observation with cardiac monitoring for 24 hours. And observe for 6 hours post continuation of Naloxone drip.

## 2018-09-29 NOTE — Telephone Encounter (Signed)
Pt reports severe nausea, vomiting, diarrhea, onset Friday 3/13. Worsening past 2 days. States "50-60" episodes of vomiting yesterday, none today "I took Zofran this AM." Reports 8 episodes of loose stool this am "Foul smelling, like mud." States incontinent at times with episodes. States diarrhea has been severe since onset Friday; "Probably Crohns flare up or C-Diff."  Reports 15 lb weight loss in 2 weeks. Reports dizziness and weakness, "Can hardly get to bathroom, go in my pants at times." States 8/10 intermittent abdominal pain. States temp 98.9 "Its usually 96.4 so this is high for me." Drinking Pedialyte,"Probably not hydrating." Pt is evasive historian, difficult to follow conversation. Pt directed to ED, declined. Requesting "The antibiotic they give me for C-Diff or appt to come in and give specimen for c-diff. "  Reiterated need for ED disposition, continues to decline. Please advise: [phone]  Reason for Disposition . [1] SEVERE vomiting (e.g., 6 or more times/day) AND [2] present > 8 hours  Answer Assessment - Initial Assessment Questions 1. VOMITING SEVERITY: "How many times have you vomited in the past 24 hours?"     - MILD:  1 - 2 times/day    - MODERATE: 3 - 5 times/day, decreased oral intake without significant weight loss or symptoms of dehydration    - SEVERE: 6 or more times/day, vomits everything or nearly everything, with significant weight loss, symptoms of dehydration      Reports "50-60 times yesterday 2. ONSET: "When did the vomiting begin?"      Friday 09/23/2018 3. FLUIDS: "What fluids or food have you vomited up today?" "Have you been able to keep any fluids down?"     Some 4. ABDOMINAL PAIN: "Are your having any abdominal pain?" If yes : "How bad is it and what does it feel like?" (e.g., crampy, dull, intermittent, constant)      Cramping 8/10 5. DIARRHEA: "Is there any diarrhea?" If so, ask: "How many times today?"      Yes, 8 episodes today 6. CONTACTS: "Is  there anyone else in the family with the same symptoms?"      no 7. CAUSE: "What do you think is causing your vomiting?"     Crohn flare up or C-diff 8. HYDRATION STATUS: "Any signs of dehydration?" (e.g., dry mouth [not only dry lips], too weak to stand) "When did you last urinate?"     Dizziness, weakness 9. OTHER SYMPTOMS: "Do you have any other symptoms?" (e.g., fever, headache, vertigo, vomiting blood or coffee grounds, recent head injury)     States temp 98.9 "Usual temp 96.4"  Protocols used: VOMITING-A-AH

## 2018-10-04 ENCOUNTER — Telehealth (INDEPENDENT_AMBULATORY_CARE_PROVIDER_SITE_OTHER): Payer: BLUE CROSS/BLUE SHIELD | Admitting: Family Medicine

## 2018-10-04 ENCOUNTER — Other Ambulatory Visit: Payer: Self-pay

## 2018-10-04 DIAGNOSIS — R197 Diarrhea, unspecified: Secondary | ICD-10-CM | POA: Diagnosis not present

## 2018-10-04 DIAGNOSIS — R110 Nausea: Secondary | ICD-10-CM

## 2018-10-04 MED ORDER — ALBUTEROL SULFATE HFA 108 (90 BASE) MCG/ACT IN AERS: 2.0000 | INHALATION_SPRAY | Freq: Four times a day (QID) | RESPIRATORY_TRACT | 5 refills | Status: DC | PRN

## 2018-10-04 MED ORDER — ONDANSETRON 8 MG PO TBDP: 8.0000 mg | ORAL_TABLET | Freq: Three times a day (TID) | ORAL | 1 refills | Status: DC | PRN

## 2018-10-04 NOTE — Progress Notes (Signed)
Virtual Visit via telephone Note  I connected with patient on 10/04/18 at 920 by telephone and verified that I am speaking with the correct person using two identifiers. [name] is currently located at home and patient is currently with her during visit. The provider, [name], MD is located in their office at time of visit.  I discussed the limitations, risks, security and privacy concerns of performing an evaluation and management service by telephone and the availability of in person appointments. I also discussed with the patient that there may be a patient responsible charge related to this service. The patient expressed understanding and agreed to proceed.  Telephone visit today for diarrhea  HPI   Friday a week ago started having diarrhea, thought it was related to stress, as having issues with his wife Started having diarrhea and vomiting since then Yesterday started to slow down Has had been having incontinence Starting to have foul smell, green several days ago Has had c diff in the past and this reminds of this He has not had any recent antibiotics Able to keep fluids down Feeling better today, had a more formed stool this morning No fevers No cough, SOB, or other URI sx No blood in stool  Fall Risk  10/04/2018 09/15/2018 02/22/2018 02/04/2018 01/12/2018  Falls in the past year? 1 0 Yes Yes Yes  Comment - - - - -  Number falls in past yr: 1 0 2 or more 2 or more 2 or more  Comment - - - - -  Injury with Fall? 1 0 Yes - Yes  Comment - - - - -  Risk Factor Category  - - High Fall Risk - -  Risk for fall due to : History of fall(s);Medication side effect;Mental status change;Other (Comment) Medication side effect - - -  Risk for fall due to: Comment - - - - -  Follow up - - - - -  Comment - - - - -     Depression screen [facility] 2/9 10/04/2018 02/04/2018 01/12/2018  Decreased Interest 3 1 1   Down, Depressed, Hopeless 3 1 1   PHQ - 2 Score 6 2 2   Altered sleeping 3 1  0  Tired, decreased energy 3 1 1   Change in appetite 3 1 0  Feeling bad or failure about yourself  0 1 0  Trouble concentrating 0 - 0  Moving slowly or fidgety/restless 3 0 1  Suicidal thoughts 0 0 0  PHQ-9 Score 18 6 4   Difficult doing work/chores Not difficult at all - -  Some recent data might be hidden    Allergies  Allergen Reactions  . Darvocet [Propoxyphene N-Acetaminophen] Anaphylaxis    Throat closes  . Propoxyphene Anaphylaxis and Swelling    Prior to Admission medications   Medication Sig Start Date End Date Taking? Authorizing Provider  albuterol (VENTOLIN HFA) 108 (90 Base) MCG/ACT inhaler Inhale 2 puffs into the lungs every 6 (six) hours as needed for wheezing or shortness of breath. 03/14/18  Yes Bloomfield, Carley D, DO  cyanocobalamin (,VITAMIN B-12,) 1000 MCG/ML injection Inject 1 mL (1,000 mcg total) into the muscle once a week. For 4 weeks and then once per month 02/04/18  Yes Wardell Honour, MD  gabapentin (NEURONTIN) 300 MG capsule Take 3 capsules (900 mg total) by mouth 3 (three) times daily. 01/12/18  Yes Wardell Honour, MD  ibuprofen (ADVIL,MOTRIN) 200 MG tablet Take 800 mg by mouth every 6 (six) hours as  needed for moderate pain.   Yes [provider]  lactulose (CHRONULAC) 10 GM/15ML solution Take 15 mLs (10 g total) by mouth 2 (two) times daily as needed for mild constipation. 08/05/18  Yes Sagardia, Ines Bloomer, MD  lisinopril (PRINIVIL,ZESTRIL) 5 MG tablet Take 5 mg by mouth daily.  07/20/18  Yes [provider]  loperamide (IMODIUM A-D) 2 MG tablet Take 3 mg by mouth 4 (four) times daily as needed for diarrhea or loose stools.   Yes [provider]  methadone (METHADOSE) 40 MG disintegrating tablet Take 200 mg by mouth daily.   Yes [provider]  naproxen (NAPROSYN) 500 MG tablet Take 500 mg by mouth 2 (two) times daily as needed for moderate pain.   Yes [provider]  nitroGLYCERIN (NITROLINGUAL) 0.4 MG/SPRAY  spray Place 2 sprays under the tongue every 5 (five) minutes x 3 doses as needed for chest pain.  03/20/15  Yes [provider]  ondansetron (ZOFRAN-ODT) 8 MG disintegrating tablet Take 1 tablet (8 mg total) by mouth every 8 (eight) hours as needed for nausea or vomiting. 08/05/18  Yes Sagardia, Ines Bloomer, MD  oxymetazoline (AFRIN) 0.05 % nasal spray Place 1 spray into both nostrils 2 (two) times daily.   Yes [provider]  ALPRAZolam Duanne Moron) 1 MG tablet Take 1 mg by mouth 4 (four) times daily.     [provider]  carvedilol (COREG) 25 MG tablet TAKE 1 TABLET BY MOUTH TWICE DAILY WITH A MEAL. Patient taking differently: Take 25 mg by mouth 2 (two) times daily with a meal.  04/14/18   Bensimhon, Shaune Pascal, MD  clopidogrel (PLAVIX) 75 MG tablet Take 1 tablet (75 mg total) by mouth daily. Pt needs to schedule an appt with our office at [phone] for future refills. Patient taking differently: Take 75 mg by mouth daily.  04/12/18   Bensimhon, Shaune Pascal, MD  diazepam (VALIUM) 10 MG tablet Take 20 mg by mouth 2 (two) times daily.  06/04/17   [provider]  ezetimibe (ZETIA) 10 MG tablet Take 1 tablet (10 mg total) by mouth daily. 04/12/18   Bensimhon, Shaune Pascal, MD  polyethylene glycol powder (GLYCOLAX/MIRALAX) powder MIX 1 CAPFUL TWICE DAILY AS NEEDED. Patient not taking: Reported on 09/29/2018 06/15/15   Copland, [name] Filler, MD  rosuvastatin (CRESTOR) 40 MG tablet Take 1 tablet (40 mg total) by mouth every evening. 04/12/18   Bensimhon, Shaune Pascal, MD  sildenafil (REVATIO) 20 MG tablet TAKE 1 TABLET BY MOUTH 3 TIMES A DAY. 08/25/18   [provider]  spironolactone (ALDACTONE) 25 MG tablet Take 1 tablet (25 mg total) by mouth daily. TAKE (1/2) TABLET DAILY. Patient taking differently: Take 25 mg by mouth daily.  04/12/18   Bensimhon, Shaune Pascal, MD  sucralfate (CARAFATE) 1 g tablet Take 1 tablet (1 g total) by mouth 4 (four) times daily -  with meals and at bedtime.  Patient not taking: Reported on 09/29/2018 03/14/18   Modena Nunnery D, DO  tamsulosin (FLOMAX) 0.4 MG CAPS capsule Take 1 capsule (0.4 mg total) by mouth daily after breakfast. Patient not taking: Reported on 09/29/2018 11/22/17   Wardell Honour, MD  torsemide (DEMADEX) 20 MG tablet Take 1 tablet (20 mg total) by mouth daily as needed. For swelling in legs Patient not taking: Reported on 09/29/2018 10/07/15   Bensimhon, Shaune Pascal, MD  traZODone (DESYREL) 100 MG tablet Take 100 mg by mouth at bedtime.     [provider]  triamcinolone (NASACORT AQ) 55 MCG/ACT AERO nasal inhaler Place 1 spray into the nose daily. Patient not taking: Reported on 09/29/2018 04/24/16   Chesley Mires, MD  VIAGRA 100 MG tablet TAKE 1 TABLET APPROXIMATELY 1 HOUR BEFORE NEEDING AS DIRECTED. Patient taking differently: Take 100 mg by mouth as needed for erectile dysfunction.  07/24/16   Bensimhon, Shaune Pascal, MD  zolpidem (AMBIEN) 10 MG tablet Take 10 mg by mouth at bedtime.  07/11/18   [provider]    Past Medical History:  Diagnosis Date  . Anxiety   . Automatic implantable cardiac defibrillator [employer]    Analyze ST  . Benign prostatic hypertrophy   . Benzodiazepine dependence (HCC)    chronic  . CAD (coronary artery disease)    Last cath 2/12. 3-v CAD. Failed PCI of distal RCAc  CATH [facility] 4/13 with DES to  LAD X2  . CHF (congestive heart failure) ([place])   . Chronic back pain    lumbar stenosis  . Chronic systolic heart failure (HCC)    EF 20-25%. s/p ST. Jude ICD  . Depression   . DJD (degenerative joint disease)   . History of testicular cancer   . Narcotic dependence (HCC)    chronic  . Polycythemia, secondary    S/p hematology consultation for polycythema on 01/06/18.  S/p bone marrow biopsy on 12/31/17 that was negative for myeloproliferative disorder.  Feels secondary to sleep apnea, active smoking, testosterone supplementation, nocturnal hypoxia  . Vitamin B12 deficiency 07/22/2016     Past Surgical History:  Procedure Laterality Date  . ASD REPAIR, SINUS VENOSUS    . CARDIAC DEFIBRILLATOR PLACEMENT  08/2010  . HERNIA REPAIR    . LEFT HEART CATHETERIZATION WITH CORONARY ANGIOGRAM N/A 06/14/2014   Procedure: LEFT HEART CATHETERIZATION WITH CORONARY ANGIOGRAM;  Surgeon: Jolaine Artist, MD;  [facility] C[facility] LAB;  Service: Cardiovascular;  Laterality: N/A;  . TESTICLE SURGERY     testicular cancer surgery left testicle removed    Social History   Tobacco Use  . Smoking status: Current Every Day Smoker    Packs/day: 0.50    Years: 29.00    Pack years: 14.50    Types: Cigarettes, E-cigarettes    Last attempt to quit: 07/14/2015    Years since quitting: 3.2  . Smokeless tobacco: Never Used  . Tobacco comment: no cigarette in 4 days but yes to e-cig  Substance Use Topics  . Alcohol use: No    Alcohol/week: 0.0 standard drinks    Family History  Problem Relation Age of Onset  . Heart disease Father 86       Died of MI  . Cancer Mother     ROS  Per hpi  Objective  Vitals as reported by the patient:  There were no vitals filed for this visit.  ASSESSMENT and PLAN  1. Diarrhea of presumed infectious origin Will check for c diff, however given patient is feeling better will withhold presumptive treatment at this time. Discussed supportive measures and RTC precautions.  - Clostridium Difficile by PCR; Future  2. Chronic nausea - ondansetron (ZOFRAN-ODT) 8 MG disintegrating tablet; Take 1 tablet (8 mg total) by mouth every 8 (eight) hours as needed for nausea or vomiting.  Other orders - albuterol (VENTOLIN HFA) 108 (90 Base) MCG/ACT inhaler; Inhale 2 puffs into the lungs every 6 (six) hours as needed for wheezing or shortness of breath.  followup as needed  I discussed the assessment and treatment  plan with the patient. The patient was provided an opportunity to ask questions and all were answered. The patient agreed with the plan and demonstrated an  understanding of the instructions.   The patient was advised to call back or seek an in-person evaluation if the symptoms worsen or if the condition fails to improve as anticipated.  The above assessment and management plan was discussed with the patient. The patient verbalized understanding of and has agreed to the management plan. Patient is aware to call the clinic if symptoms persist or worsen. Patient is aware when to return to the clinic for a follow-up visit. Patient educated on when it is appropriate to go to the emergency department.    I provided 11 minutes of non-face-to-face time during this encounter.  [name], MD Primary Care at [facility] [place], [place] [address] Ph.  [phone] Fax [phone]

## 2018-10-06 ENCOUNTER — Other Ambulatory Visit: Payer: Self-pay

## 2018-10-06 DIAGNOSIS — R197 Diarrhea, unspecified: Secondary | ICD-10-CM

## 2018-10-07 LAB — CLOSTRIDIUM DIFFICILE BY PCR: Toxigenic C. Difficile by PCR: NEGATIVE

## 2018-10-10 ENCOUNTER — Other Ambulatory Visit (HOSPITAL_COMMUNITY): Payer: Self-pay | Admitting: Internal Medicine

## 2018-10-13 ENCOUNTER — Other Ambulatory Visit (HOSPITAL_COMMUNITY): Payer: Self-pay | Admitting: Internal Medicine

## 2018-10-31 ENCOUNTER — Other Ambulatory Visit: Payer: Self-pay

## 2018-11-01 ENCOUNTER — Ambulatory Visit (INDEPENDENT_AMBULATORY_CARE_PROVIDER_SITE_OTHER): Payer: BLUE CROSS/BLUE SHIELD | Admitting: Gastroenterology

## 2018-11-01 ENCOUNTER — Encounter: Payer: Self-pay | Admitting: Gastroenterology

## 2018-11-01 ENCOUNTER — Other Ambulatory Visit: Payer: Self-pay

## 2018-11-01 DIAGNOSIS — R1084 Generalized abdominal pain: Secondary | ICD-10-CM | POA: Diagnosis not present

## 2018-11-01 DIAGNOSIS — R194 Change in bowel habit: Secondary | ICD-10-CM | POA: Diagnosis not present

## 2018-11-01 DIAGNOSIS — R634 Abnormal weight loss: Secondary | ICD-10-CM

## 2018-11-01 NOTE — Progress Notes (Signed)
This patient contacted our office requesting a physician telemedicine video consultation regarding clinical questions and/or test results.  If new patient, they were referred by Grant Fontana, MD (primary care [place])  Participants on the Zoom: Myself and the patient  The patient consented to phone consultation and was aware that a charge will be placed through their insurance.  I was in my office and the patient was at home   Encounter time:  Total time 45 minutes, with 30 minutes spent with patient on phone/webex    Wilfrid Lund, MD   _____________________________________________________________________________________________            [name] GI Progress Note  Chief Complaint: generalized abd pain and altered bowel habits  Subjective  History:   Saw [facility] 2015/16, chronic constipation.  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  Reports months of diarrhea for 5 months, feels weak and "like I'm going to die" "rotting from the inside out" No Abx prior to diarrhea onset Vomiting daily - needs Zofran  [name] is a somewhat tangential historian.  He has a prior history of C. difficile as noted above.  He feels like the symptoms might be reminiscent of that.  He says he has chronic diarrhea and a 40 pound weight loss in the last several months.  However, he also reports chronic constipation.  He might eat a meal and have the need for BM soon afterwards, other times no BM for 1 to 2 days.  Finally goes he has abdominal pain vomiting, foul belching "like feces" in the stool may be loose.  Then the "cycle begins again".  He has a chronic pain syndrome and is on methadone for that.  Multiple other medical issues and medicines as noted below.  ROS: Cardiovascular:  no chest pain Respiratory: no  dyspnea Chronic pain syndrome Chronic anxiety Dyspnea with exertion  Believes he has lost 40 pounds in last several months  The patient's Past Medical, Family and Social History were reviewed and are on file in the EMR. Past Medical History:  Diagnosis Date  . Anxiety   . Automatic implantable cardiac defibrillator [employer]    Analyze ST  . Benign prostatic hypertrophy   . Benzodiazepine dependence (HCC)    chronic  . CAD (coronary artery disease)    Last cath 2/12. 3-v CAD. Failed PCI of distal RCAc  CATH [facility] 4/13 with DES to  LAD X2  . CHF (congestive heart failure) ([place])   . Chronic back pain    lumbar stenosis  . Chronic systolic heart failure (HCC)    EF 20-25%. s/p ST. Jude ICD  . Depression   . DJD (degenerative joint disease)   . History of testicular cancer   . Narcotic dependence (HCC)    chronic  . Polycythemia, secondary    S/p hematology consultation for polycythema on 01/06/18.  S/p bone marrow biopsy on 12/31/17 that was negative for myeloproliferative disorder.  Feels secondary to sleep apnea, active smoking, testosterone supplementation, nocturnal hypoxia  . Vitamin B12 deficiency 07/22/2016     has a past surgical history that includes Hernia repair; ASD repair, sinus venosus; Testicle surgery; Cardiac defibrillator placement (08/2010); and left heart catheterization with coronary angiogram (N/A, 06/14/2014).  Social history:  Reports that he has not left the house in a couple of  months.  He has sniffing and stress related to his "soon-to-be ex-wife" still living in the house along with their 74 year old son who has ADD and requires a lot of attention.  Objective:  Med list reviewed  Current Outpatient Medications:  .  albuterol (VENTOLIN HFA) 108 (90 Base) MCG/ACT inhaler, Inhale 2 puffs into the lungs every 6 (six) hours as needed for wheezing or shortness of breath., Disp: 18 g, Rfl: 5 .  ALPRAZolam (XANAX) 1 MG tablet, Take 1 mg by mouth 4 (four) times  daily. , Disp: , Rfl:  .  carvedilol (COREG) 25 MG tablet, TAKE 1 TABLET BY MOUTH TWICE DAILY WITH A MEAL. (Patient taking differently: Take 25 mg by mouth 2 (two) times daily with a meal. ), Disp: 180 tablet, Rfl: 0 .  clopidogrel (PLAVIX) 75 MG tablet, Take 1 tablet (75 mg total) by mouth daily. Pt needs to schedule an appt with our office at [phone] for future refills. (Patient taking differently: Take 75 mg by mouth daily. ), Disp: 30 tablet, Rfl: 2 .  cyanocobalamin (,VITAMIN B-12,) 1000 MCG/ML injection, Inject 1 mL (1,000 mcg total) into the muscle once a week. For 4 weeks and then once per month (Patient not taking: Reported on 10/31/2018), Disp: 10 mL, Rfl: 3 .  diazepam (VALIUM) 10 MG tablet, Take 20 mg by mouth 2 (two) times daily. , Disp: , Rfl: 2 .  ezetimibe (ZETIA) 10 MG tablet, Take 1 tablet (10 mg total) by mouth daily. (Patient not taking: Reported on 10/31/2018), Disp: 30 tablet, Rfl: 2 .  ibuprofen (ADVIL,MOTRIN) 200 MG tablet, Take 800 mg by mouth every 6 (six) hours as needed for moderate pain., Disp: , Rfl:  .  lactulose (CHRONULAC) 10 GM/15ML solution, Take 15 mLs (10 g total) by mouth 2 (two) times daily as needed for mild constipation. (Patient not taking: Reported on 10/31/2018), Disp: 1892 mL, Rfl: 0 .  lisinopril (PRINIVIL,ZESTRIL) 5 MG tablet, Take 5 mg by mouth daily. , Disp: , Rfl:  .  loperamide (IMODIUM A-D) 2 MG tablet, Take 3 mg by mouth 4 (four) times daily as needed for diarrhea or loose stools., Disp: , Rfl:  .  methadone (METHADOSE) 40 MG disintegrating tablet, Take 200 mg by mouth daily., Disp: , Rfl:  .  naproxen (NAPROSYN) 500 MG tablet, Take 500 mg by mouth 2 (two) times daily as needed for moderate pain., Disp: , Rfl:  .  nitroGLYCERIN (NITROLINGUAL) 0.4 MG/SPRAY spray, Place 2 sprays under the tongue every 5 (five) minutes x 3 doses as needed for chest pain. , Disp: , Rfl:  .  ondansetron (ZOFRAN-ODT) 8 MG disintegrating tablet, Take 1 tablet (8 mg total)  by mouth every 8 (eight) hours as needed for nausea or vomiting., Disp: 90 tablet, Rfl: 1 .  oxymetazoline (AFRIN) 0.05 % nasal spray, Place 1 spray into both nostrils 2 (two) times daily., Disp: , Rfl:  .  pantoprazole (PROTONIX) 40 MG tablet, Take 40 mg by mouth daily., Disp: , Rfl:  .  polyethylene glycol powder (GLYCOLAX/MIRALAX) powder, MIX 1 CAPFUL TWICE DAILY AS NEEDED. (Patient not taking: Reported on 10/31/2018), Disp: 250 g, Rfl: 0 .  rosuvastatin (CRESTOR) 40 MG tablet, Take 1 tablet (40 mg total) by mouth every evening., Disp: 30 tablet, Rfl: 2 .  sildenafil (REVATIO) 20 MG tablet, TAKE 1 TABLET BY MOUTH 3 TIMES A DAY., Disp: , Rfl:  .  spironolactone (ALDACTONE) 25 MG tablet, Take 1 tablet (25 mg total) by mouth daily.  TAKE (1/2) TABLET DAILY. (Patient taking differently: Take 25 mg by mouth daily. ), Disp: 15 tablet, Rfl: 2 .  sucralfate (CARAFATE) 1 g tablet, Take 1 tablet (1 g total) by mouth 4 (four) times daily -  with meals and at bedtime., Disp: 120 tablet, Rfl: 0 .  tamsulosin (FLOMAX) 0.4 MG CAPS capsule, Take 1 capsule (0.4 mg total) by mouth daily after breakfast. (Patient not taking: Reported on 09/29/2018), Disp: 90 capsule, Rfl: 0 .  torsemide (DEMADEX) 20 MG tablet, Take 1 tablet (20 mg total) by mouth daily as needed. For swelling in legs (Patient not taking: Reported on 09/29/2018), Disp: 90 tablet, Rfl: 2 .  traZODone (DESYREL) 100 MG tablet, Take 100 mg by mouth at bedtime. , Disp: , Rfl:  .  triamcinolone (NASACORT AQ) 55 MCG/ACT AERO nasal inhaler, Place 1 spray into the nose daily. (Patient not taking: Reported on 09/29/2018), Disp: 1 Inhaler, Rfl: 12 .  VIAGRA 100 MG tablet, TAKE 1 TABLET APPROXIMATELY 1 HOUR BEFORE NEEDING AS DIRECTED. (Patient taking differently: Take 100 mg by mouth as needed for erectile dysfunction. ), Disp: 4 tablet, Rfl: 0 .  zolpidem (AMBIEN) 10 MG tablet, Take 10 mg by mouth at bedtime. , Disp: , Rfl:    Reports previously being on 500 mg of  morphine daily for chronic back pain  He still takes lactulose daily because without it he has constipation.  Then takes Imodium when he has loose stool.   No exam - virtual visit  Recent Labs:  Recent C. difficile test as noted above  CBC Latest Ref Rng & Units 09/29/2018 09/15/2018 03/11/2018  WBC 4.0 - 10.5 K/uL 6.9 7.2 9.5  Hemoglobin 13.0 - 17.0 g/dL 16.5 18.5(H) 17.7(H)  Hematocrit 39.0 - 52.0 % 50.1 53.6(H) 53.6(H)  Platelets 150 - 400 K/uL 159 156 209   CMP Latest Ref Rng & Units 09/29/2018 09/15/2018 03/14/2018  Glucose 70 - 99 mg/dL 114(H) 85 102(H)  BUN 6 - 20 mg/dL _0 Creatinine 0.61 - 1.24 mg/dL 0.98 0.98 0.99  Sodium 135 - 145 mmol/L 138 140 139  Potassium 3.5 - 5.1 mmol/L 3.8 5.0 4.0  Chloride 98 - 111 mmol/L 102 98 104  CO2 22 - 32 mmol/L _1 Calcium 8.9 - 10.3 mg/dL 9.0 10.1 8.7(L)  Total Protein 6.5 - 8.1 g/dL 7.3 7.7 -  Total Bilirubin 0.3 - 1.2 mg/dL 1.2 0.5 -  Alkaline Phos 38 - 126 U/L 83 101 -  AST 15 - 41 U/L 14(L) 14 -  ALT 0 - 44 U/L 11 12 -   Albumin normal at 3.8   _2 @ Assessment: Encounter Diagnoses  Name Primary?  . Generalized abdominal pain Yes  . Altered bowel habits   . Abnormal loss of weight     Difficult to fully characterize complex overall scenario.  The symptoms appear to go back longer than just 5 months.  He has a CT scan abdomen and pelvis from June 2019 reportedly done for vomiting nausea microhematuria and 40 pound weight loss.  It shows large colon stool burden, otherwise normal except for aortic atherosclerosis.  I have reassured that his recent albumin is normal despite the reported weight loss.  He seems likely to have altered motility due to chronic opiate use and general polypharmacy.  Not clear yet what to make of the reported "colitis" on distant colonoscopy.  Complex cardiopulmonary conditions with congestive heart failure and sleep apnea requiring high-dose oxygen overnight.  Plan:  He  will need an in office consultation for further review of history, full examination in order to make better determination of possible causes and work-up.    Nelida Meuse III

## 2018-11-03 ENCOUNTER — Telehealth: Payer: Self-pay | Admitting: Gastroenterology

## 2018-11-03 NOTE — Telephone Encounter (Signed)
Pt called stating that Dr. Loletha Carrow wanted to have a follow up appointment in the office with him because his case is complicated. I was not able to find anything regarding that in ov notes. Please contact pt to clarify.

## 2018-11-03 NOTE — Telephone Encounter (Signed)
[name] per Dr. Loletha Carrow' recent OV note he states he wants to see this pt in the office for a visit. Do you know if he wanted the pt scheduled now or was he waiting until the restrictions are lifted due to covid.

## 2018-11-03 NOTE — Telephone Encounter (Signed)
Pt is on our return to office list post [place]. Will will schedule him an office visit once cleared.

## 2018-11-14 DIAGNOSIS — I5022 Chronic systolic (congestive) heart failure: Secondary | ICD-10-CM | POA: Diagnosis not present

## 2018-11-21 DIAGNOSIS — K5909 Other constipation: Secondary | ICD-10-CM | POA: Diagnosis not present

## 2018-11-21 DIAGNOSIS — G8929 Other chronic pain: Secondary | ICD-10-CM | POA: Diagnosis not present

## 2018-11-21 DIAGNOSIS — K219 Gastro-esophageal reflux disease without esophagitis: Secondary | ICD-10-CM | POA: Diagnosis not present

## 2018-11-21 DIAGNOSIS — F1120 Opioid dependence, uncomplicated: Secondary | ICD-10-CM | POA: Diagnosis not present

## 2018-11-22 NOTE — Telephone Encounter (Signed)
This patient was seen last month as telemedicine.  Please contact him to arrange an in-person clinic visit on one of the dates we have available.  It could be as soon as this Thursday.

## 2018-11-28 ENCOUNTER — Telehealth: Payer: Self-pay

## 2018-11-28 NOTE — Telephone Encounter (Signed)
I called and left patient a message about scheduling a telehealth visit. Patient is on our recall list and we have openings on Friday 12/02/18.

## 2018-12-06 DIAGNOSIS — G4733 Obstructive sleep apnea (adult) (pediatric): Secondary | ICD-10-CM | POA: Diagnosis not present

## 2018-12-15 ENCOUNTER — Other Ambulatory Visit (HOSPITAL_COMMUNITY): Payer: Self-pay | Admitting: Internal Medicine

## 2018-12-16 ENCOUNTER — Ambulatory Visit: Payer: BLUE CROSS/BLUE SHIELD | Admitting: Family Medicine

## 2018-12-16 NOTE — Telephone Encounter (Signed)
Patient did not return call and schedule follow up appointment.

## 2018-12-19 DIAGNOSIS — F4322 Adjustment disorder with anxiety: Secondary | ICD-10-CM | POA: Diagnosis not present

## 2018-12-30 ENCOUNTER — Telehealth: Payer: Self-pay | Admitting: Internal Medicine

## 2018-12-30 NOTE — Telephone Encounter (Signed)
Spoke to pt regarding concerns, requested manual transmission. Pt stated he will send transmission today and requested a call back from DC when received.

## 2018-12-30 NOTE — Telephone Encounter (Signed)
  Patient is having issues with his pacemaker and feels that he needs a new battery. I have him a virtual appt made on 01/24/19 but he thinks he may need to come in to be seen.

## 2018-12-30 NOTE — Telephone Encounter (Signed)
Manual transmission requested. Pt states he is going to do it now.

## 2019-01-02 ENCOUNTER — Telehealth: Payer: Self-pay

## 2019-01-02 NOTE — Telephone Encounter (Signed)
LMOVM that we have not receive his transmission from his home monitor. I left my direct office number for him to call if he needs help sending the transmission.

## 2019-01-02 NOTE — Telephone Encounter (Signed)
Pt's Merlin monitor is up to date, no transmission since 05/07/2016. Scheduled for automatic transmission on 01/16/19 (earliest available date) in the event that pt does not send a manual transmission. See phone note opened 01/02/19.

## 2019-01-03 NOTE — Telephone Encounter (Signed)
LMOVM

## 2019-01-04 ENCOUNTER — Ambulatory Visit (INDEPENDENT_AMBULATORY_CARE_PROVIDER_SITE_OTHER): Payer: BC Managed Care – PPO | Admitting: *Deleted

## 2019-01-04 DIAGNOSIS — I255 Ischemic cardiomyopathy: Secondary | ICD-10-CM | POA: Diagnosis not present

## 2019-01-04 NOTE — Telephone Encounter (Signed)
Transmission received 01-04-2019 

## 2019-01-04 NOTE — Telephone Encounter (Signed)
Will schedule remote check.

## 2019-01-04 NOTE — Telephone Encounter (Signed)
Spoke to pt, requested he attempt to send another manual transmission. Pt agrees.

## 2019-01-05 LAB — CUP PACEART REMOTE DEVICE CHECK
Battery Remaining Longevity: 30 mo
Battery Remaining Percentage: 27 %
Brady Statistic RA Percent Paced: 1 %
Brady Statistic RV Percent Paced: 1 %
Date Time Interrogation Session: 20200625094129
HighPow Impedance: 62 Ohm
Implantable Lead Implant Date: 20120201
Implantable Lead Implant Date: 20120201
Implantable Lead Location: 753859
Implantable Lead Location: 753860
Implantable Pulse Generator Implant Date: 20120201
Lead Channel Impedance Value: 330 Ohm
Lead Channel Impedance Value: 430 Ohm
Lead Channel Sensing Intrinsic Amplitude: 12 mV
Lead Channel Sensing Intrinsic Amplitude: 3.3 mV
Lead Channel Setting Pacing Amplitude: 2 V
Lead Channel Setting Pacing Amplitude: 3 V
Lead Channel Setting Pacing Pulse Width: 0.5 ms
Lead Channel Setting Sensing Sensitivity: 0.5 mV
Pulse Gen Serial Number: 815096

## 2019-01-09 ENCOUNTER — Telehealth: Payer: Self-pay | Admitting: Acute Care

## 2019-01-09 ENCOUNTER — Other Ambulatory Visit: Payer: Self-pay

## 2019-01-09 ENCOUNTER — Emergency Department (HOSPITAL_COMMUNITY): Payer: BC Managed Care – PPO

## 2019-01-09 ENCOUNTER — Telehealth: Payer: Self-pay | Admitting: Pulmonary Disease

## 2019-01-09 ENCOUNTER — Encounter (HOSPITAL_COMMUNITY): Payer: Self-pay | Admitting: *Deleted

## 2019-01-09 ENCOUNTER — Emergency Department (HOSPITAL_COMMUNITY)
Admission: EM | Admit: 2019-01-09 | Discharge: 2019-01-09 | Disposition: A | Discharge status: Home / Self Care | Payer: BC Managed Care – PPO | Attending: Emergency Medicine | Admitting: Emergency Medicine

## 2019-01-09 DIAGNOSIS — R0602 Shortness of breath: Secondary | ICD-10-CM | POA: Diagnosis not present

## 2019-01-09 DIAGNOSIS — I2510 Atherosclerotic heart disease of native coronary artery without angina pectoris: Secondary | ICD-10-CM | POA: Diagnosis not present

## 2019-01-09 DIAGNOSIS — J449 Chronic obstructive pulmonary disease, unspecified: Secondary | ICD-10-CM | POA: Insufficient documentation

## 2019-01-09 DIAGNOSIS — I491 Atrial premature depolarization: Secondary | ICD-10-CM | POA: Diagnosis not present

## 2019-01-09 DIAGNOSIS — R5383 Other fatigue: Secondary | ICD-10-CM | POA: Diagnosis not present

## 2019-01-09 DIAGNOSIS — F439 Reaction to severe stress, unspecified: Secondary | ICD-10-CM | POA: Diagnosis not present

## 2019-01-09 DIAGNOSIS — R000 Tachycardia, unspecified: Secondary | ICD-10-CM | POA: Diagnosis not present

## 2019-01-09 DIAGNOSIS — F17210 Nicotine dependence, cigarettes, uncomplicated: Secondary | ICD-10-CM | POA: Insufficient documentation

## 2019-01-09 DIAGNOSIS — Z79899 Other long term (current) drug therapy: Secondary | ICD-10-CM | POA: Diagnosis not present

## 2019-01-09 DIAGNOSIS — R079 Chest pain, unspecified: Secondary | ICD-10-CM | POA: Diagnosis not present

## 2019-01-09 DIAGNOSIS — F17290 Nicotine dependence, other tobacco product, uncomplicated: Secondary | ICD-10-CM | POA: Diagnosis not present

## 2019-01-09 DIAGNOSIS — Z95810 Presence of automatic (implantable) cardiac defibrillator: Secondary | ICD-10-CM | POA: Diagnosis not present

## 2019-01-09 DIAGNOSIS — F1210 Cannabis abuse, uncomplicated: Secondary | ICD-10-CM | POA: Diagnosis not present

## 2019-01-09 DIAGNOSIS — R0902 Hypoxemia: Secondary | ICD-10-CM | POA: Diagnosis not present

## 2019-01-09 LAB — CBC
HCT: 48.3 % (ref 39.0–52.0)
Hemoglobin: 16.6 g/dL (ref 13.0–17.0)
MCH: 33 pg (ref 26.0–34.0)
MCHC: 34.4 g/dL (ref 30.0–36.0)
MCV: 96 fL (ref 80.0–100.0)
Platelets: 134 10*3/uL — ABNORMAL LOW (ref 150–400)
RBC: 5.03 MIL/uL (ref 4.22–5.81)
RDW: 13.7 % (ref 11.5–15.5)
WBC: 10.3 10*3/uL (ref 4.0–10.5)
nRBC: 0 % (ref 0.0–0.2)

## 2019-01-09 LAB — BASIC METABOLIC PANEL
Anion gap: 11 (ref 5–15)
BUN: 7 mg/dL (ref 6–20)
CO2: 27 mmol/L (ref 22–32)
Calcium: 8.9 mg/dL (ref 8.9–10.3)
Chloride: 97 mmol/L — ABNORMAL LOW (ref 98–111)
Creatinine, Ser: 1.06 mg/dL (ref 0.61–1.24)
GFR calc Af Amer: >60 mL/min (ref >60)
GFR calc non Af Amer: >60 mL/min (ref >60)
Glucose, Bld: 72 mg/dL (ref 70–99)
Potassium: 3.5 mmol/L (ref 3.5–5.1)
Sodium: 135 mmol/L (ref 135–145)

## 2019-01-09 LAB — TROPONIN I (HIGH SENSITIVITY)
Troponin I (High Sensitivity): 14 ng/L (ref <18)
Troponin I (High Sensitivity): 21 ng/L — ABNORMAL HIGH (ref <18)

## 2019-01-09 MED ORDER — SODIUM CHLORIDE 0.9% FLUSH: 3.0000 mL | Freq: Once | INTRAVENOUS | Status: DC

## 2019-01-09 NOTE — ED Notes (Signed)
Interrogated pacemakers per Dr. Leonette Monarch Request

## 2019-01-09 NOTE — ED Notes (Signed)
Pt. Presents to room sleepy. Pt slurred speech. Pt desat 86% on room air placed on O2.

## 2019-01-09 NOTE — ED Notes (Signed)
Discharge instructions discussed with pt. Pt verbalized understanding. No questions at this time. Pt. To go home with family.

## 2019-01-09 NOTE — Telephone Encounter (Signed)
Needs letter of necessity for oxygen while in jail.

## 2019-01-09 NOTE — ED Notes (Addendum)
Report received in regards to pacemaker interrogation. Given to Dr. Leonette Monarch. Per [employer] representative No abnormal findings.

## 2019-01-09 NOTE — Telephone Encounter (Signed)
Received a  Call that this patient is requesting a letter of necessity for oxygen and BiPap therapy while in jail. He is to report to jail tonight. He called the office at 5:32 pm. When I returned the call there was no answer. The call went directly to voice mail and the message box is full. I have called the call center, and explained that the patient has been called and he did not answer. I explained that the call went directly to VM, and the mail box is full. The letter of necessity has been written. The patient did not provide an e mail address of fax number where the letter needed to be sent. He needs to provide this information fir [place] to allow the letter to be sent. The call center stated they will pass along this information, and that the patient needs to answer his phone if he returns the call. The letter will be placed

## 2019-01-09 NOTE — Telephone Encounter (Signed)
The patient returned the call. I have faxed the letter to [phone] at his request.
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
## 2019-01-09 NOTE — Telephone Encounter (Signed)
Triage,  Please generate a letter stating the patient does require 5 L of O2 at night with his BiPAP.  He is managed in our office for obstructive sleep apnea as well as chronic respiratory failure.  Please also print off last office note.  Route this to wherever the patient needs this information to go.  Will route this message to Dr. Halford Chessman as Juluis Rainier that pt is currently in jail.   Wyn Quaker FNP

## 2019-01-09 NOTE — ED Notes (Signed)
Pt. Placed on 3 L [place] due to HX sleep apnea. De-sat mid 28s while sleeping.
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
## 2019-01-10 NOTE — Telephone Encounter (Signed)
Thanks Sarah

## 2019-01-10 NOTE — Telephone Encounter (Signed)
Letter faxed to [phone].  Nothing further is needed.

## 2019-01-10 NOTE — Telephone Encounter (Signed)
Letter was written on 01/09/19 and tried to call but mailbox is full.

## 2019-01-10 NOTE — Telephone Encounter (Signed)
Letter written and signed 01/09/19 by SG and tried to call pt but mailbox is full.

## 2019-01-11 NOTE — Telephone Encounter (Signed)
Called and spoke with pt letting him know that the letter was written and pt verbalized understanding. Stated to pt he should be able to pull letter from [employer] account and pt verbalized understanding. Nothing further needed.

## 2019-01-12 ENCOUNTER — Telehealth: Payer: Self-pay

## 2019-01-12 ENCOUNTER — Telehealth: Payer: Self-pay | Admitting: Family Medicine

## 2019-01-12 NOTE — Telephone Encounter (Signed)
July 16th is ok

## 2019-01-12 NOTE — Telephone Encounter (Signed)
[name] is scheduled for an in person visit on 02-14-2019. He states he has seen Dr Dema Severin GI at [facility] She has ordered some testing for him and wants [place] to order them here locally. Please advise. GI notes are in care-every-where

## 2019-01-12 NOTE — Telephone Encounter (Signed)
Pt has a 01/13/19 hosp f/u appointment that we had to cancel for deep cleaning. He is needing to be sooner than available. Can we book him sooner? Please let front desk know and we will make app.

## 2019-01-13 ENCOUNTER — Ambulatory Visit: Payer: BC Managed Care – PPO | Admitting: Family Medicine

## 2019-01-13 NOTE — Telephone Encounter (Signed)
It appears he had a telemedicine consult with Dr. Dema Severin, then tried some medicines. They were planning multiple tests.   He would be best served by one GI doctor  If he wishes to have his care locally, then I will see him in clinic and then decide on testing.

## 2019-01-16 ENCOUNTER — Encounter: Payer: Self-pay | Admitting: Nurse Practitioner

## 2019-01-16 ENCOUNTER — Other Ambulatory Visit: Payer: Self-pay

## 2019-01-16 ENCOUNTER — Ambulatory Visit (INDEPENDENT_AMBULATORY_CARE_PROVIDER_SITE_OTHER): Payer: BC Managed Care – PPO | Admitting: Nurse Practitioner

## 2019-01-16 ENCOUNTER — Telehealth: Payer: Self-pay | Admitting: Pulmonary Disease

## 2019-01-16 ENCOUNTER — Encounter: Payer: Self-pay | Admitting: Cardiology

## 2019-01-16 ENCOUNTER — Ambulatory Visit (INDEPENDENT_AMBULATORY_CARE_PROVIDER_SITE_OTHER): Payer: BC Managed Care – PPO

## 2019-01-16 VITALS — BP 130/70 | HR 59 | Temp 98.0°F | Ht 71.0 in | Wt 200.0 lb

## 2019-01-16 DIAGNOSIS — R0781 Pleurodynia: Secondary | ICD-10-CM

## 2019-01-16 DIAGNOSIS — S2241XA Multiple fractures of ribs, right side, initial encounter for closed fracture: Secondary | ICD-10-CM | POA: Diagnosis not present

## 2019-01-16 DIAGNOSIS — R079 Chest pain, unspecified: Secondary | ICD-10-CM | POA: Diagnosis not present

## 2019-01-16 DIAGNOSIS — R0602 Shortness of breath: Secondary | ICD-10-CM

## 2019-01-16 NOTE — Telephone Encounter (Signed)
Called and spoke with pt letting him know the info stated by TP and that we needed to get him scheduled for an OV if he was still symptomatic based on info stated earlier. Pt verbalized understanding. Pt stated he would like to come into office for the appt to be checked out.   appt has been scheduled for pt wit [facility] today at 3pm. Pt told to wear a mask and is aware where our office is located. Nothing further needed.

## 2019-01-16 NOTE — Telephone Encounter (Signed)
I calleld patient and the call dropped, no goes to voicemail with a mailbox that is full and cannot leave a msg.

## 2019-01-16 NOTE — Telephone Encounter (Signed)
Sorry to hear this .  ER notes reviewed If he is still symptomatic , needs office visit with open APP  to be evaluated. If can not come in , would do video visit   Please contact office for sooner follow up if symptoms do not improve or worsen or seek emergency care

## 2019-01-16 NOTE — Telephone Encounter (Signed)
Pt notified and aware. He will keeps his follow up as scheduled.

## 2019-01-16 NOTE — Telephone Encounter (Signed)
Pt is calling back. Cb is [phone].

## 2019-01-16 NOTE — Patient Instructions (Addendum)
May alternate warm and cool compresses May continue naproxen  Continue BiPAP at night with 5L O2  Will check x ray and call with results  Follow up: Follow up with Dr. Melvyn Novas in 2-3 weeks
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
## 2019-01-16 NOTE — Progress Notes (Signed)
Remote ICD transmission.   

## 2019-01-17 ENCOUNTER — Telehealth (INDEPENDENT_AMBULATORY_CARE_PROVIDER_SITE_OTHER): Payer: BC Managed Care – PPO | Admitting: Emergency Medicine

## 2019-01-17 ENCOUNTER — Encounter: Payer: Self-pay | Admitting: Nurse Practitioner

## 2019-01-17 ENCOUNTER — Telehealth: Payer: Self-pay

## 2019-01-17 ENCOUNTER — Encounter: Payer: Self-pay | Admitting: Emergency Medicine

## 2019-01-17 VITALS — Ht 72.0 in | Wt 200.0 lb

## 2019-01-17 DIAGNOSIS — G4731 Primary central sleep apnea: Secondary | ICD-10-CM

## 2019-01-17 DIAGNOSIS — I5042 Chronic combined systolic (congestive) and diastolic (congestive) heart failure: Secondary | ICD-10-CM

## 2019-01-17 DIAGNOSIS — F411 Generalized anxiety disorder: Secondary | ICD-10-CM

## 2019-01-17 DIAGNOSIS — J9611 Chronic respiratory failure with hypoxia: Secondary | ICD-10-CM

## 2019-01-17 DIAGNOSIS — G894 Chronic pain syndrome: Secondary | ICD-10-CM

## 2019-01-17 DIAGNOSIS — R0781 Pleurodynia: Secondary | ICD-10-CM | POA: Insufficient documentation

## 2019-01-17 DIAGNOSIS — R0989 Other specified symptoms and signs involving the circulatory and respiratory systems: Secondary | ICD-10-CM | POA: Diagnosis not present
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
## 2019-01-17 NOTE — Telephone Encounter (Signed)
I tried to call patient about upcoming appointment with Dr. Caryl Comes on 01/24/19. It is scheduled for a virtual visit. Does patient want to keep appointment a virtual visit or come into the office?
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
## 2019-01-17 NOTE — Telephone Encounter (Signed)
Patient returned my call, he would like to come in for an office visit instead of having a telehealth visit.
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
## 2019-01-19 NOTE — Progress Notes (Signed)
Noted. Put as sticky note in chart

## 2019-01-23 NOTE — Progress Notes (Signed)
Chart and office note reviewed in detail  > agree with a/p as outlined    

## 2019-01-24 ENCOUNTER — Telehealth: Payer: Self-pay | Admitting: Internal Medicine

## 2019-01-24 ENCOUNTER — Encounter: Payer: BC Managed Care – PPO | Admitting: Internal Medicine

## 2019-01-24 NOTE — Telephone Encounter (Signed)

## 2019-01-25 ENCOUNTER — Ambulatory Visit (INDEPENDENT_AMBULATORY_CARE_PROVIDER_SITE_OTHER): Payer: BC Managed Care – PPO | Admitting: Internal Medicine

## 2019-01-25 ENCOUNTER — Encounter: Payer: Self-pay | Admitting: Internal Medicine

## 2019-01-25 ENCOUNTER — Other Ambulatory Visit: Payer: Self-pay

## 2019-01-25 VITALS — BP 122/80 | HR 52 | Ht 72.0 in | Wt 197.4 lb

## 2019-01-25 DIAGNOSIS — I255 Ischemic cardiomyopathy: Secondary | ICD-10-CM | POA: Diagnosis not present

## 2019-01-25 DIAGNOSIS — Z95810 Presence of automatic (implantable) cardiac defibrillator: Secondary | ICD-10-CM

## 2019-01-25 DIAGNOSIS — I5042 Chronic combined systolic (congestive) and diastolic (congestive) heart failure: Secondary | ICD-10-CM

## 2019-01-25 NOTE — Progress Notes (Signed)
Patient Care Team: [name], MD as PCP - General (Family Medicine) Govert, Natale Lay, MD as Referring Physician (Pulmonary Disease) Norma Fredrickson, MD as Consulting Physician (Psychiatry) Deboraha Sprang, MD as Consulting Physician (Cardiology) Bensimhon, Shaune Pascal, MD as Consulting Physician (Cardiology) Tanda Rockers, MD as Consulting Physician (Pulmonary Disease) Chesley Mires, MD as Consulting Physician (Pulmonary Disease) Charlynn Grimes, MD as Referring Physician (Internal Medicine)   HPI  [name] is a 58 y.o. male SEEN in follow-up for an ICD implanted 2012 for primary prevention in the setting of ischemic cardiomyopathy and ejection fraction of 25% complex ventricular ectopy and not amenable to revascularization.  No interval ICD shocks  Followed at [facility] and Results Reviewed   Past Medical History:  Diagnosis Date  . Anxiety   . Automatic implantable cardiac defibrillator [employer]    Analyze ST  . Benign prostatic hypertrophy   . Benzodiazepine dependence (HCC)    chronic  . CAD (coronary artery disease)    Last cath 2/12. 3-v CAD. Failed PCI of distal RCAc  CATH [facility] 4/13 with DES to  LAD X2  . CHF (congestive heart failure) ([place])   . Chronic back pain    lumbar stenosis  . Chronic systolic heart failure (HCC)    EF 20-25%. s/p ST. Jude ICD  . Depression   . DJD (degenerative joint disease)   . History of testicular cancer   . Narcotic dependence (HCC)    chronic  . Polycythemia, secondary    S/p hematology consultation for polycythema on 01/06/18.  S/p bone marrow biopsy on 12/31/17 that was negative for myeloproliferative disorder.  Feels secondary to sleep apnea, active smoking, testosterone supplementation, nocturnal hypoxia  . Vitamin B12 deficiency 07/22/2016    Past Surgical History:  Procedure Laterality Date  . ASD REPAIR, SINUS VENOSUS    . CARDIAC DEFIBRILLATOR PLACEMENT  08/2010  . HERNIA REPAIR    . LEFT HEART  CATHETERIZATION WITH CORONARY ANGIOGRAM N/A 06/14/2014   Procedure: LEFT HEART CATHETERIZATION WITH CORONARY ANGIOGRAM;  Surgeon: Jolaine Artist, MD;  [facility] CATH LAB;  Service: Cardiovascular;  Laterality: N/A;  . TESTICLE SURGERY     testicular cancer surgery left testicle removed    Current Meds  Medication Sig  . albuterol (VENTOLIN HFA) 108 (90 Base) MCG/ACT inhaler Inhale 2 puffs into the lungs every 6 (six) hours as needed for wheezing or shortness of breath.  . ALPRAZolam (XANAX) 1 MG tablet Take 1 mg by mouth 4 (four) times daily.   . carvedilol (COREG) 25 MG tablet TAKE 1 TABLET BY MOUTH TWICE DAILY WITH A MEAL.  Marland Kitchen clopidogrel (PLAVIX) 75 MG tablet Take 1 tablet (75 mg total) by mouth daily. Pt needs to schedule an appt with our office at [phone] for future refills.  . cyanocobalamin (,VITAMIN B-12,) 1000 MCG/ML injection Inject 1 mL (1,000 mcg total) into the muscle once a week. For 4 weeks and then once per month  . diazepam (VALIUM) 10 MG tablet Take 20 mg by mouth 2 (two) times daily.   Marland Kitchen ezetimibe (ZETIA) 10 MG tablet Take 1 tablet (10 mg total) by mouth daily.  Marland Kitchen ibuprofen (ADVIL,MOTRIN) 200 MG tablet Take 800 mg by mouth every 6 (six) hours as needed for moderate pain.  Marland Kitchen lactulose (CHRONULAC) 10 GM/15ML solution Take 15 mLs (10 g total) by mouth 2 (two) times daily as needed for mild constipation.  Marland Kitchen lisinopril (PRINIVIL,ZESTRIL) 5 MG  tablet Take 5 mg by mouth daily.   Marland Kitchen loperamide (IMODIUM A-D) 2 MG tablet Take 3 mg by mouth 4 (four) times daily as needed for diarrhea or loose stools.  . methadone (METHADOSE) 40 MG disintegrating tablet Take 200 mg by mouth daily.  . naproxen (NAPROSYN) 500 MG tablet Take 500 mg by mouth 2 (two) times daily as needed for moderate pain.  . nitroGLYCERIN (NITROLINGUAL) 0.4 MG/SPRAY spray Place 2 sprays under the tongue every 5 (five) minutes x 3 doses as needed for chest pain.   Marland Kitchen ondansetron (ZOFRAN-ODT) 8 MG disintegrating  tablet Take 1 tablet (8 mg total) by mouth every 8 (eight) hours as needed for nausea or vomiting.  Marland Kitchen oxymetazoline (AFRIN) 0.05 % nasal spray Place 1 spray into both nostrils 2 (two) times daily.  . pantoprazole (PROTONIX) 40 MG tablet Take 40 mg by mouth daily.  Marland Kitchen Plecanatide (TRULANCE PO) Take by mouth daily.  . polyethylene glycol powder (GLYCOLAX/MIRALAX) powder MIX 1 CAPFUL TWICE DAILY AS NEEDED.  Marland Kitchen rosuvastatin (CRESTOR) 40 MG tablet Take 1 tablet (40 mg total) by mouth every evening.  . sildenafil (REVATIO) 20 MG tablet TAKE 1 TABLET BY MOUTH 3 TIMES A DAY.  Marland Kitchen spironolactone (ALDACTONE) 25 MG tablet Take 25 mg by mouth daily.  . sucralfate (CARAFATE) 1 g tablet Take 1 tablet (1 g total) by mouth 4 (four) times daily -  with meals and at bedtime.  . tamsulosin (FLOMAX) 0.4 MG CAPS capsule Take 1 capsule (0.4 mg total) by mouth daily after breakfast.  . TESTOSTERONE CYPIONATE IM Inject into the muscle every 14 (fourteen) days.  Marland Kitchen torsemide (DEMADEX) 20 MG tablet Take 1 tablet (20 mg total) by mouth daily as needed. For swelling in legs  . traZODone (DESYREL) 100 MG tablet Take 100 mg by mouth at bedtime.   . triamcinolone (NASACORT AQ) 55 MCG/ACT AERO nasal inhaler Place 1 spray into the nose daily.  Marland Kitchen VIAGRA 100 MG tablet TAKE 1 TABLET APPROXIMATELY 1 HOUR BEFORE NEEDING AS DIRECTED.  Marland Kitchen zolpidem (AMBIEN) 10 MG tablet Take 10 mg by mouth at bedtime.     Allergies  Allergen Reactions  . Darvocet [Propoxyphene N-Acetaminophen] Anaphylaxis    Throat closes  . Propoxyphene Anaphylaxis and Swelling      Review of Systems negative except from HPI and PMH  Physical Exam BP 122/80   Pulse (!) 52   Ht 6' (1.829 m)   Wt 197 lb 6.4 oz (89.5 kg)   SpO2 97%   BMI 26.77 kg/m  Well developed and nourished in no acute distress HENT normal Neck supple with JVP-  flat   Clear Regular rate and rhythm, no murmurs or gallops Abd-soft with active BS No Clubbing cyanosis edema Skin-warm  and dry A & Oriented  Grossly normal sensory and motor function  ECG Sinus @ 52 16/11/49\  Assessment and  Plan  Ischemic CM  ICD St Jude    Device function normal  Continue transmissions      Current medicines are reviewed at length with the patient today .  The patient does not  have concerns regarding medicines.

## 2019-01-25 NOTE — Patient Instructions (Signed)

## 2019-01-26 ENCOUNTER — Inpatient Hospital Stay: Payer: BC Managed Care – PPO | Admitting: Family Medicine

## 2019-01-26 ENCOUNTER — Other Ambulatory Visit: Payer: Self-pay

## 2019-01-26 LAB — CUP PACEART INCLINIC DEVICE CHECK
Date Time Interrogation Session: 20200716081216
Implantable Lead Implant Date: 20120201
Implantable Lead Implant Date: 20120201
Implantable Lead Location: 753859
Implantable Lead Location: 753860
Implantable Pulse Generator Implant Date: 20120201
Pulse Gen Serial Number: 815096

## 2019-02-14 ENCOUNTER — Ambulatory Visit: Payer: BC Managed Care – PPO | Admitting: Gastroenterology

## 2019-02-24 ENCOUNTER — Ambulatory Visit: Payer: BC Managed Care – PPO | Admitting: Family Medicine

## 2019-03-03 ENCOUNTER — Telehealth (INDEPENDENT_AMBULATORY_CARE_PROVIDER_SITE_OTHER): Payer: BC Managed Care – PPO | Admitting: Family Medicine

## 2019-03-03 ENCOUNTER — Other Ambulatory Visit: Payer: Self-pay

## 2019-03-03 VITALS — Ht 71.5 in | Wt 190.0 lb

## 2019-03-03 DIAGNOSIS — Z20822 Contact with and (suspected) exposure to covid-19: Secondary | ICD-10-CM

## 2019-03-03 DIAGNOSIS — E538 Deficiency of other specified B group vitamins: Secondary | ICD-10-CM

## 2019-03-03 DIAGNOSIS — N529 Male erectile dysfunction, unspecified: Secondary | ICD-10-CM

## 2019-03-03 DIAGNOSIS — Z20828 Contact with and (suspected) exposure to other viral communicable diseases: Secondary | ICD-10-CM | POA: Diagnosis not present

## 2019-03-03 DIAGNOSIS — H00011 Hordeolum externum right upper eyelid: Secondary | ICD-10-CM

## 2019-03-03 DIAGNOSIS — R110 Nausea: Secondary | ICD-10-CM | POA: Diagnosis not present

## 2019-03-03 DIAGNOSIS — I2510 Atherosclerotic heart disease of native coronary artery without angina pectoris: Secondary | ICD-10-CM

## 2019-03-03 MED ORDER — NAPROXEN 500 MG PO TABS: 500.0000 mg | ORAL_TABLET | Freq: Two times a day (BID) | ORAL | 2 refills | Status: DC | PRN

## 2019-03-03 MED ORDER — ONDANSETRON 8 MG PO TBDP: 8.0000 mg | ORAL_TABLET | Freq: Three times a day (TID) | ORAL | 1 refills | Status: DC | PRN

## 2019-03-03 MED ORDER — ALBUTEROL SULFATE HFA 108 (90 BASE) MCG/ACT IN AERS: 2.0000 | INHALATION_SPRAY | Freq: Four times a day (QID) | RESPIRATORY_TRACT | 5 refills | Status: DC | PRN

## 2019-03-03 MED ORDER — PANTOPRAZOLE SODIUM 40 MG PO TBEC: 40.0000 mg | DELAYED_RELEASE_TABLET | Freq: Every day | ORAL | 1 refills | Status: DC

## 2019-03-03 MED ORDER — SILDENAFIL CITRATE 20 MG PO TABS: 20.0000 mg | ORAL_TABLET | Freq: Three times a day (TID) | ORAL | 3 refills | Status: DC

## 2019-03-03 MED ORDER — ERYTHROMYCIN 5 MG/GM OP OINT: 1.0000 "application " | TOPICAL_OINTMENT | Freq: Every day | OPHTHALMIC | 1 refills | Status: DC

## 2019-03-03 NOTE — Progress Notes (Signed)
Called patient to triage for appointment. Patient states he need refill on his medications. Patient states he has a stye on the right eye he noticed last Saturday, the bone around eye hurts. Patient states he wants a COVID test because his girlfriend had a 102 temp, she tested positive for strep and COVID test is pending.

## 2019-03-03 NOTE — Progress Notes (Signed)
Virtual Visit Note  I connected with patient on 03/03/19 at 549pm by phone and verified that I am speaking with the correct person using two identifiers. [name] is currently located at home and patient is currently with them during visit. The provider, [name], MD is located in their office at time of visit.  I discussed the limitations, risks, security and privacy concerns of performing an evaluation and management service by telephone and the availability of in person appointments. I also discussed with the patient that there may be a patient responsible charge related to this service. The patient expressed understanding and agreed to proceed.   CC: med refills and covid test  HPI ? Patient overall doing well  2 days ago his girlfriend was febrile, dx with strep, covid test pending Patient has not had any fever or URI Sx, would like to be tested He also had a stye upper on eyelid of right eye Has been using a very old erythromycin ointment He is also wondering about his b12 injections, has not done them in several months, used to take them weekly but pharmacy would only give monthly He continues to see cards regularly He is requesting refill of viagra, revatio is more affordable He is requesting refill of zofran which he takes prn for chrons flare ups or when very anxious His constipation is well managed with lactulose He is requesting refill of pantoprazole which he takes daily and does well on it He is also requesting refill of naproxen which he takes prn for joint pain  Allergies  Allergen Reactions  . Darvocet [Propoxyphene N-Acetaminophen] Anaphylaxis    Throat closes  . Propoxyphene Anaphylaxis and Swelling    Prior to Admission medications   Medication Sig Start Date End Date Taking? Authorizing Provider  albuterol (VENTOLIN HFA) 108 (90 Base) MCG/ACT inhaler Inhale 2 puffs into the lungs every 6 (six) hours as needed for wheezing or shortness of breath.  10/04/18  Yes [name], MD  ALPRAZolam Duanne Moron) 1 MG tablet Take 1 mg by mouth 4 (four) times daily.    Yes [provider]  carvedilol (COREG) 25 MG tablet TAKE 1 TABLET BY MOUTH TWICE DAILY WITH A MEAL. 04/14/18  Yes Bensimhon, Shaune Pascal, MD  clopidogrel (PLAVIX) 75 MG tablet Take 1 tablet (75 mg total) by mouth daily. Pt needs to schedule an appt with our office at [phone] for future refills. 04/12/18  Yes Bensimhon, Shaune Pascal, MD  cyanocobalamin (,VITAMIN B-12,) 1000 MCG/ML injection Inject 1 mL (1,000 mcg total) into the muscle once a week. For 4 weeks and then once per month 02/04/18  Yes Wardell Honour, MD  diazepam (VALIUM) 10 MG tablet Take 20 mg by mouth 2 (two) times daily.  06/04/17  Yes [provider]  ezetimibe (ZETIA) 10 MG tablet Take 1 tablet (10 mg total) by mouth daily. 04/12/18  Yes Bensimhon, Shaune Pascal, MD  ibuprofen (ADVIL,MOTRIN) 200 MG tablet Take 800 mg by mouth every 6 (six) hours as needed for moderate pain.   Yes [provider]  lactulose (CHRONULAC) 10 GM/15ML solution Take 15 mLs (10 g total) by mouth 2 (two) times daily as needed for mild constipation. 08/05/18  Yes Sagardia, Ines Bloomer, MD  lisinopril (PRINIVIL,ZESTRIL) 5 MG tablet Take 5 mg by mouth daily.  07/20/18  Yes [provider]  loperamide (IMODIUM A-D) 2 MG tablet Take 3 mg by mouth 4 (four) times daily as needed for diarrhea or loose  stools.   Yes [provider]  methadone (METHADOSE) 40 MG disintegrating tablet Take 200 mg by mouth daily.   Yes [provider]  naproxen (NAPROSYN) 500 MG tablet Take 500 mg by mouth 2 (two) times daily as needed for moderate pain.   Yes [provider]  nitroGLYCERIN (NITROLINGUAL) 0.4 MG/SPRAY spray Place 2 sprays under the tongue every 5 (five) minutes x 3 doses as needed for chest pain.  03/20/15  Yes [provider]  ondansetron (ZOFRAN-ODT) 8 MG disintegrating tablet Take 1 tablet (8 mg total) by  mouth every 8 (eight) hours as needed for nausea or vomiting. 10/04/18  Yes [name], MD  oxymetazoline (AFRIN) 0.05 % nasal spray Place 1 spray into both nostrils 2 (two) times daily.   Yes [provider]  pantoprazole (PROTONIX) 40 MG tablet Take 40 mg by mouth daily.   Yes [provider]  polyethylene glycol powder (GLYCOLAX/MIRALAX) powder MIX 1 CAPFUL TWICE DAILY AS NEEDED. 06/15/15  Yes Copland, [name] Filler, MD  rosuvastatin (CRESTOR) 40 MG tablet Take 1 tablet (40 mg total) by mouth every evening. 04/12/18  Yes Bensimhon, Shaune Pascal, MD  sildenafil (REVATIO) 20 MG tablet TAKE 1 TABLET BY MOUTH 3 TIMES A DAY. 08/25/18  Yes [provider]  spironolactone (ALDACTONE) 25 MG tablet Take 25 mg by mouth daily.   Yes [provider]  sucralfate (CARAFATE) 1 g tablet Take 1 tablet (1 g total) by mouth 4 (four) times daily -  with meals and at bedtime. 03/14/18  Yes Bloomfield, Carley D, DO  tamsulosin (FLOMAX) 0.4 MG CAPS capsule Take 1 capsule (0.4 mg total) by mouth daily after breakfast. 11/22/17  Yes Wardell Honour, MD  TESTOSTERONE CYPIONATE IM Inject into the muscle every 14 (fourteen) days.   Yes [provider]  torsemide (DEMADEX) 20 MG tablet Take 1 tablet (20 mg total) by mouth daily as needed. For swelling in legs 10/07/15  Yes Bensimhon, Shaune Pascal, MD  traZODone (DESYREL) 100 MG tablet Take 100 mg by mouth at bedtime.    Yes [provider]  triamcinolone (NASACORT AQ) 55 MCG/ACT AERO nasal inhaler Place 1 spray into the nose daily. 04/24/16  Yes Chesley Mires, MD  zolpidem (AMBIEN) 10 MG tablet Take 10 mg by mouth at bedtime.  07/11/18  Yes [provider]  Plecanatide (TRULANCE PO) Take by mouth daily.    [provider]  VIAGRA 100 MG tablet TAKE 1 TABLET APPROXIMATELY 1 HOUR BEFORE NEEDING AS DIRECTED. Patient not taking: Reported on 03/03/2019 07/24/16   Bensimhon, Shaune Pascal, MD    Past Medical History:  Diagnosis  Date  . Anxiety   . Automatic implantable cardiac defibrillator [employer]    Analyze ST  . Benign prostatic hypertrophy   . Benzodiazepine dependence (HCC)    chronic  . CAD (coronary artery disease)    Last cath 2/12. 3-v CAD. Failed PCI of distal RCAc  CATH [facility] 4/13 with DES to  LAD X2  . CHF (congestive heart failure) ([place])   . Chronic back pain    lumbar stenosis  . Chronic systolic heart failure (HCC)    EF 20-25%. s/p ST[employer] Jude ICD  . Depression   . DJD (degenerative joint disease)   . History of testicular cancer   . Narcotic dependence (HCC)    chronic  . Polycythemia, secondary    S/p hematology consultation for polycythema on 01/06/18.  S/p bone marrow biopsy on 12/31/17 that  was negative for myeloproliferative disorder.  Feels secondary to sleep apnea, active smoking, testosterone supplementation, nocturnal hypoxia  . Vitamin B12 deficiency 07/22/2016    Past Surgical History:  Procedure Laterality Date  . ASD REPAIR, SINUS VENOSUS    . CARDIAC DEFIBRILLATOR PLACEMENT  08/2010  . HERNIA REPAIR    . LEFT HEART CATHETERIZATION WITH CORONARY ANGIOGRAM N/A 06/14/2014   Procedure: LEFT HEART CATHETERIZATION WITH CORONARY ANGIOGRAM;  Surgeon: Jolaine Artist, MD;  Location: [facility] CATH LAB;  Service: Cardiovascular;  Laterality: N/A;  . TESTICLE SURGERY     testicular cancer surgery left testicle removed    Social History   Tobacco Use  . Smoking status: Current Every Day Smoker    Packs/day: 0.50    Years: 29.00    Pack years: 14.50    Types: Cigarettes, E-cigarettes    Last attempt to quit: 07/14/2015    Years since quitting: 3.6  . Smokeless tobacco: Never Used  . Tobacco comment: no cigarette in 4 days but yes to e-cig  Substance Use Topics  . Alcohol use: No    Alcohol/week: 0.0 standard drinks    Family History  Problem Relation Age of Onset  . Heart disease Father 70       Died of MI  . Cancer Mother     ROS Per hpi  Objective  Vitals as reported by  the patient:  Lab Results  Component Value Date   CHOL 233 (H) 09/15/2018   HDL 39 (L) 09/15/2018   LDLCALC 172 (H) 09/15/2018   TRIG 110 09/15/2018   CHOLHDL 6.0 (H) 09/15/2018   Lab Results  Component Value Date   VITAMINB12 301 09/15/2018   Lab Results  Component Value Date   CREATININE 1.06 01/09/2019   BUN 7 01/09/2019   NA 135 01/09/2019   K 3.5 01/09/2019   CL 97 (L) 01/09/2019   CO2 27 01/09/2019   Lab Results  Component Value Date   ALT 11 09/29/2018   AST 14 (L) 09/29/2018   ALKPHOS 83 09/29/2018   BILITOT 1.2 09/29/2018    ASSESSMENT and PLAN  1. Hordeolum externum of right upper eyelid rx erythromycin given. Discussed warm compresses  2. Exposure to Covid-19 Virus Asymptomatic, to remain in quarantine until girlfriend's result and his come back - Novel Coronavirus, NAA ([facility])  3. Chronic nausea - ondansetron (ZOFRAN-ODT) 8 MG disintegrating tablet; Take 1 tablet (8 mg total) by mouth every 8 (eight) hours as needed for nausea or vomiting.  4. Vitamin B12 deficiency Checking labs today, medications will be adjusted as needed.  - Vitamin B12; Future  5. Erectile dysfunction, unspecified erectile dysfunction type stable. Continue current regime.   6. Atherosclerosis of native coronary artery of native heart without angina pectoris Managed by cards. Checking labs today, medications will be adjusted as needed.  - Comprehensive metabolic panel; Future - Lipid panel; Future  Other orders - albuterol (VENTOLIN HFA) 108 (90 Base) MCG/ACT inhaler; Inhale 2 puffs into the lungs every 6 (six) hours as needed for wheezing or shortness of breath. - naproxen (NAPROSYN) 500 MG tablet; Take 1 tablet (500 mg total) by mouth 2 (two) times daily as needed for moderate pain. - pantoprazole (PROTONIX) 40 MG tablet; Take 1 tablet (40 mg total) by mouth daily. - sildenafil (REVATIO) 20 MG tablet; Take 1 tablet (20 mg total) by mouth 3 (three) times daily. -  erythromycin ophthalmic ointment; Place 1 application into the right eye at bedtime.  FOLLOW-UP: 6 months  The above assessment and management plan was discussed with the patient. The patient verbalized understanding of and has agreed to the management plan. Patient is aware to call the clinic if symptoms persist or worsen. Patient is aware when to return to the clinic for a follow-up visit. Patient educated on when it is appropriate to go to the emergency department.    I provided 19 minutes of non-face-to-face time during this encounter.  [name], MD [facility] at [facility] [place], [place] [address] Ph.  [phone] Fax [phone]

## 2019-03-07 ENCOUNTER — Other Ambulatory Visit (HOSPITAL_COMMUNITY): Payer: Self-pay | Admitting: Internal Medicine

## 2019-03-08 ENCOUNTER — Other Ambulatory Visit (HOSPITAL_COMMUNITY): Payer: Self-pay | Admitting: Internal Medicine

## 2019-03-14 ENCOUNTER — Ambulatory Visit: Payer: BC Managed Care – PPO | Admitting: Gastroenterology

## 2019-03-14 ENCOUNTER — Encounter: Payer: Self-pay | Admitting: Gastroenterology

## 2019-03-14 VITALS — BP 100/70 | HR 88 | Temp 98.4°F | Ht 72.0 in | Wt 188.0 lb

## 2019-03-14 DIAGNOSIS — R1084 Generalized abdominal pain: Secondary | ICD-10-CM | POA: Diagnosis not present

## 2019-03-14 DIAGNOSIS — R110 Nausea: Secondary | ICD-10-CM | POA: Diagnosis not present

## 2019-03-14 DIAGNOSIS — R634 Abnormal weight loss: Secondary | ICD-10-CM

## 2019-03-14 DIAGNOSIS — K5909 Other constipation: Secondary | ICD-10-CM | POA: Diagnosis not present
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
## 2019-03-14 NOTE — Patient Instructions (Signed)
If you are age 59 or older, your body mass index should be between 23-30. Your Body mass index is 25.5 kg/m. If this is out of the aforementioned range listed, please consider follow up with your Primary Care Provider.  If you are age 71 or younger, your body mass index should be between 19-25. Your Body mass index is 25.5 kg/m. If this is out of the aformentioned range listed, please consider follow up with your Primary Care Provider.   We will call you with a hospital date for an EGD/Colon.  It was a pleasure to see you today!  Dr. Loletha Carrow

## 2019-04-06 ENCOUNTER — Ambulatory Visit (INDEPENDENT_AMBULATORY_CARE_PROVIDER_SITE_OTHER): Payer: BC Managed Care – PPO | Admitting: *Deleted

## 2019-04-06 ENCOUNTER — Other Ambulatory Visit (HOSPITAL_COMMUNITY): Payer: Self-pay | Admitting: Internal Medicine

## 2019-04-06 DIAGNOSIS — I255 Ischemic cardiomyopathy: Secondary | ICD-10-CM

## 2019-04-07 LAB — CUP PACEART REMOTE DEVICE CHECK
Date Time Interrogation Session: 20200925080551
Implantable Lead Implant Date: 20120201
Implantable Lead Implant Date: 20120201
Implantable Lead Location: 753859
Implantable Lead Location: 753860
Implantable Pulse Generator Implant Date: 20120201
Pulse Gen Serial Number: 815096

## 2019-04-14 ENCOUNTER — Encounter: Payer: Self-pay | Admitting: Cardiology

## 2019-04-14 NOTE — Progress Notes (Signed)
Remote ICD transmission.   

## 2019-04-26 ENCOUNTER — Telehealth: Payer: Self-pay | Admitting: Gastroenterology

## 2019-04-26 ENCOUNTER — Other Ambulatory Visit: Payer: Self-pay | Admitting: *Deleted

## 2019-04-26 ENCOUNTER — Encounter: Payer: Self-pay | Admitting: *Deleted

## 2019-04-26 ENCOUNTER — Other Ambulatory Visit: Payer: Self-pay

## 2019-04-26 DIAGNOSIS — R1084 Generalized abdominal pain: Secondary | ICD-10-CM

## 2019-04-26 DIAGNOSIS — K5909 Other constipation: Secondary | ICD-10-CM

## 2019-04-26 MED ORDER — NA SULFATE-K SULFATE-MG SULF 17.5-3.13-1.6 GM/177ML PO SOLN: 1.0000 | Freq: Once | ORAL | 0 refills | Status: AC

## 2019-04-26 NOTE — Progress Notes (Unsigned)
suprep

## 2019-04-26 NOTE — Telephone Encounter (Signed)
Noted. The patient reported he was followed by [facility] cardiology, Dr. Sharion Balloon. After speaking with the patient again, he then reported he was in fact followed by [facility], Dr. Charlynn Grimes. [place] letter faxed electronically to Dr. Stann Mainland. Called his office to speak to someone to see if it was possible to get clearance before Friday so the patient's endo/colon is not delayed. Will follow up tomorrow.   Spoke to the patient. Informed the patient of the following appointments:  10/22 at 11:30 am hosp procedure, patient aware  10/19 at 8:45 am COVID screen, patient aware  Spoke to the patient on the phone, reviewed the entire 2-day prep with the patient. Invited questions, questions answered. Patient requested prep instructions sent via MyChart. Instructions sent to patient via MyChart. Stressed to patient to NOT STOP PLAVIX until he has heard from [facility] stating we received cardiac clearance. Patient verbalized understanding.

## 2019-04-26 NOTE — Telephone Encounter (Signed)
[facility] Pre-operative Risk Assessment     Request for surgical clearance:     Endoscopy Procedure  What type of surgery is being performed?     ENDO/COLON  When is this surgery scheduled?     05/04/2019  What type of clearance is required ?   Pharmacy  Are there any medications that need to be held prior to surgery and how long? Plavix, 5 days  Practice name and name of physician performing surgery?      [facility], Dr. Wilfrid Lund MD  What is your office phone and fax number?      Phone- [phone]  [phone]  Anesthesia type (None, local, MAC, general) ?       MAC

## 2019-04-26 NOTE — Telephone Encounter (Signed)
Patient is being followed at [facility] by Dr. Charlynn Grimes who is his primary cardiologist and by Dr. Caryl Comes of [place] who is his electrophysiologist. The decision to hold plavix prior to endoscopy procedure will need to be cleared by Dr. Charlynn Grimes at [facility].   Please inform the requesting provider

## 2019-04-27 NOTE — Telephone Encounter (Signed)
[facility], spoke with Jarrett Ables who reported she would the the message to Dr. Stann Mainland as an urgent request. Will continue to follow up.

## 2019-04-27 NOTE — Telephone Encounter (Signed)
Called the requesting office of [facility] and spoke with [place]. I informed her that they would need to send the cardiac clearance to Dr Charlynn Grimes at [facility]. [place] stated that she knew that information and was not sure why it was sent to our office.

## 2019-04-28 NOTE — Telephone Encounter (Signed)
Received faxed telephone encounter by Jackelyn Knife RN "Per Dr. Stann Mainland, Mr. Skarda is to continue ASA during the procedure. He may hold Plavix for 5-7 days for the procedure. Please contact our office with any further concerns/questions." A copy of this fax has been given to the front office to be scanned into the chart. One copy has been kept at my desk if misplaced.   Called the patient, spoke to the patient and gave him the good news that we obtained clearance and that he was to take his last dose of Plavix today and would not until Dr. Loletha Carrow gave him clearance to resume most likely after his procedure on 10/22.

## 2019-05-01 ENCOUNTER — Other Ambulatory Visit (HOSPITAL_COMMUNITY)
Admission: RE | Admit: 2019-05-01 | Discharge: 2019-05-01 | Disposition: A | Discharge status: Home / Self Care | Payer: BC Managed Care – PPO | Source: Ambulatory Visit | Attending: Gastroenterology | Admitting: Gastroenterology

## 2019-05-01 DIAGNOSIS — Z20828 Contact with and (suspected) exposure to other viral communicable diseases: Secondary | ICD-10-CM | POA: Insufficient documentation

## 2019-05-01 DIAGNOSIS — Z01812 Encounter for preprocedural laboratory examination: Secondary | ICD-10-CM | POA: Diagnosis not present

## 2019-05-02 LAB — NOVEL CORONAVIRUS, NAA (HOSP ORDER, SEND-OUT TO REF LAB; TAT 18-24 HRS): SARS-CoV-2, NAA: NOT DETECTED

## 2019-05-03 ENCOUNTER — Encounter (HOSPITAL_COMMUNITY): Payer: Self-pay | Admitting: Physician Assistant

## 2019-05-03 ENCOUNTER — Telehealth: Payer: Self-pay | Admitting: Gastroenterology

## 2019-05-03 ENCOUNTER — Encounter (HOSPITAL_COMMUNITY): Payer: Self-pay

## 2019-05-03 NOTE — Progress Notes (Signed)
Attempted to obtain medical history via telephone, unable to reach at this time. I left a voicemail to return pre surgical testing department's phone call.  I requested ICD/Defib programming ordered be faxed to [phone].  Cardiac clearance noted in telephone encounter 04/27/2019.  EKG 01/25/2019 in epic.  ECHO 03/12/2018 in epic.  Last device check 04/07/2019 in epic.  Karoline Caldwell P.A. reviewed chart and has note in epic 05/03/2019.

## 2019-05-03 NOTE — Telephone Encounter (Signed)
FYI- Spoke to the patient who reported a death in the family. Confirmed with the patient myself that he wanted his procedure to be cancelled. Patient confirmed he wanted the procedure cancelled and apologized for the short notice. I told the patient that due to his cancellation he would be placed back on the wait list and that he may not be contacted until January or February to be scheduled. The patient verbalized understanding and stated he may reach out to [facility] to schedule this procedure in the meantime. No other complaints or concerns voiced by the conclusion of the call.

## 2019-05-03 NOTE — Progress Notes (Signed)
Anesthesia Chart Review: Pt follows with EP cardiology at [facility], Dr. Caryl Comes, and general cardiology at [facility], Dr. Stann Mainland. Per notes in Epic, instructions on ASA and Plavix were deferred to Dr. Stann Mainland. Per telephone encounter in care everywhere, Dr. Stann Mainland advised to continue ASA and hold Plavix 5-7d preop.  Hx of ICD implanted 2012 for primary prevention in the setting of ischemic cardiomyopathy and ejection fraction of 25% complex ventricular ectopy and not amenable to revascularization. EF has improved to 30-35%. Last seen by Dr. Caryl Comes 01/25/19. Device functioning normally at that time, no interval shocks, no changes to management.   Last seen by Dr. Stann Mainland 11/14/18. Per OV note "Chronic systolic heart failure. Clinically stable from a heart failure standpoint. No changes to his medical regimen."  TTE 03/12/18: - Left ventricle: The cavity size was moderately dilated. Wall   thickness was normal. Systolic function was moderately to   severely reduced. The estimated ejection fraction was in the   range of 30% to 35%. There is akinesis of the basal-midinferior   myocardium. Doppler parameters are consistent with restrictive   physiology, indicative of decreased left ventricular diastolic   compliance and/or increased left atrial pressure. - Aortic valve: Mildly calcified annulus. Trileaflet. - Mitral valve: Mildly thickened leaflets. There was moderate   regurgitation. - Left atrium: The atrium was severely dilated. - Right ventricle: Pacer wire or catheter noted in right ventricle. - Atrial septum: No defect or patent foramen ovale was identified. - Tricuspid valve: There was mild regurgitation. - Pulmonary arteries: PA peak pressure: 45 mm Hg (S). - Pericardium, extracardiac: There was no pericardial effusion.    [facility] Short Stay Center/Anesthesiology Phone [phone] 05/03/2019 10:21 AM

## 2019-05-03 NOTE — Telephone Encounter (Signed)
Clearance faxed to [phone] to the attention of [place] with [place] cover sheet and [facility]'s clearance sent from Saunders Revel, RN.

## 2019-05-03 NOTE — Progress Notes (Signed)
Attempted pre op call, no answer, automated voicemail. No message left.

## 2019-05-03 NOTE — Anesthesia Preprocedure Evaluation (Deleted)
Anesthesia Evaluation    Airway        Dental   Pulmonary Current Smoker,           Cardiovascular      Neuro/Psych    GI/Hepatic   Endo/Other    Renal/GU      Musculoskeletal   Abdominal   Peds  Hematology   Anesthesia Other Findings   Reproductive/Obstetrics                             Anesthesia Physical Anesthesia Plan  ASA:   Anesthesia Plan:    Post-op Pain Management:    Induction:   PONV Risk Score and Plan:   Airway Management Planned:   Additional Equipment:   Intra-op Plan:   Post-operative Plan:   Informed Consent:   Plan Discussed with:   Anesthesia Plan Comments: (Pt follows with EP cardiology at [facility], Dr. Caryl Comes, and general cardiology at [facility], Dr. Stann Mainland. Per notes in Epic, instructions on ASA and Plavix were deferred to Dr. Stann Mainland. Per telephone encounter in care everywhere, Dr. Stann Mainland advised to continue ASA and hold Plavix 5-7d preop.  Hx of ICD implanted 2012 for primary prevention in the setting of ischemic cardiomyopathy and ejection fraction of 25% complex ventricular ectopy and not amenable to revascularization. EF has improved to 30-35%. Last seen by Dr. Caryl Comes 01/25/19. Device functioning normally at that time, no interval shocks, no changes to management.   Last seen by Dr. Stann Mainland 11/14/18. Per OV note "Chronic systolic heart failure. Clinically stable from a heart failure standpoint. No changes to his medical regimen."  TTE 03/12/18: - Left ventricle: The cavity size was moderately dilated. Wall   thickness was normal. Systolic function was moderately to   severely reduced. The estimated ejection fraction was in the   range of 30% to 35%. There is akinesis of the basal-midinferior   myocardium. Doppler parameters are consistent with restrictive   physiology, indicative of decreased left ventricular diastolic   compliance and/or increased left atrial  pressure. - Aortic valve: Mildly calcified annulus. Trileaflet. - Mitral valve: Mildly thickened leaflets. There was moderate   regurgitation. - Left atrium: The atrium was severely dilated. - Right ventricle: Pacer wire or catheter noted in right ventricle. - Atrial septum: No defect or patent foramen ovale was identified. - Tricuspid valve: There was mild regurgitation. - Pulmonary arteries: PA peak pressure: 45 mm Hg (S). - Pericardium, extracardiac: There was no pericardial effusion.  )       Anesthesia Quick Evaluation

## 2019-05-03 NOTE — Telephone Encounter (Signed)
Thank you for the update.  Sorry for his loss.  Resume plavix.  Please check his chart in 3-4 weeks.  If he does not re-establish care with [facility], then call him and offer next available [facility] slot.

## 2019-05-03 NOTE — Telephone Encounter (Signed)
Understood 

## 2019-05-04 ENCOUNTER — Encounter (HOSPITAL_COMMUNITY): Admission: RE | Payer: Self-pay | Source: Home / Self Care

## 2019-05-04 ENCOUNTER — Ambulatory Visit (HOSPITAL_COMMUNITY)
Admission: RE | Admit: 2019-05-04 | Payer: BC Managed Care – PPO | Source: Home / Self Care | Admitting: Gastroenterology

## 2019-05-04 HISTORY — DX: Obstructive sleep apnea (adult) (pediatric): G47.33

## 2019-05-04 HISTORY — DX: Transient cerebral ischemic attack, unspecified: G45.9

## 2019-05-04 HISTORY — DX: Acute myocardial infarction, unspecified: I21.9

## 2019-05-04 SURGERY — ESOPHAGOGASTRODUODENOSCOPY (EGD) WITH PROPOFOL
Anesthesia: Monitor Anesthesia Care

## 2019-05-10 DIAGNOSIS — F4322 Adjustment disorder with anxiety: Secondary | ICD-10-CM | POA: Diagnosis not present

## 2019-05-15 ENCOUNTER — Ambulatory Visit: Payer: BC Managed Care – PPO | Admitting: Family Medicine

## 2019-05-16 ENCOUNTER — Encounter: Payer: Self-pay | Admitting: *Deleted

## 2019-05-16 ENCOUNTER — Encounter: Payer: Self-pay | Admitting: Family Medicine

## 2019-05-16 ENCOUNTER — Other Ambulatory Visit: Payer: Self-pay | Admitting: *Deleted

## 2019-05-16 ENCOUNTER — Telehealth: Payer: Self-pay | Admitting: *Deleted

## 2019-05-16 DIAGNOSIS — K5909 Other constipation: Secondary | ICD-10-CM

## 2019-05-16 DIAGNOSIS — R1084 Generalized abdominal pain: Secondary | ICD-10-CM

## 2019-05-16 NOTE — Telephone Encounter (Signed)
Spoke to the patient who has been scheduled for an endo/colon with Dr. Loletha Carrow at [facility]. Patient on Plavix, per Dr. Stann Mainland (Care Everywhere), the patient is about to hold Plavix for 5-7 days (Danis requires 5 days only prior to the procedure). Danis is also requiring a TWO-DAY PREP for colonoscopy. Patient has been scheduled for the following. All appointments scheduled with the patient:   06/20/2019 at 1:00 pm pre-op visit with the nurse  06/30/2019 at 1:05 pm COVID screening at [facility]  07/04/2019 at 11:45 am [facility] endo/colon  Patient sent MyChart message and reminder letter place in the mail.

## 2019-05-23 ENCOUNTER — Telehealth: Payer: Self-pay | Admitting: Family Medicine

## 2019-05-23 ENCOUNTER — Ambulatory Visit: Payer: BC Managed Care – PPO | Admitting: Family Medicine

## 2019-05-23 NOTE — Telephone Encounter (Signed)
I called pt and left a vm that Dr. Pamella Pert called out. I let him know he needs to rescheduled because I cancelled his appointment.

## 2019-05-24 DIAGNOSIS — Z9189 Other specified personal risk factors, not elsewhere classified: Secondary | ICD-10-CM | POA: Diagnosis not present

## 2019-05-24 DIAGNOSIS — Z20828 Contact with and (suspected) exposure to other viral communicable diseases: Secondary | ICD-10-CM | POA: Diagnosis not present

## 2019-05-25 ENCOUNTER — Other Ambulatory Visit (HOSPITAL_COMMUNITY)
Admission: RE | Admit: 2019-05-25 | Discharge: 2019-05-25 | Disposition: A | Discharge status: Home / Self Care | Payer: BC Managed Care – PPO | Source: Ambulatory Visit | Attending: Family Medicine | Admitting: Family Medicine

## 2019-05-25 ENCOUNTER — Ambulatory Visit: Payer: BC Managed Care – PPO | Admitting: Family Medicine

## 2019-05-25 ENCOUNTER — Encounter: Payer: Self-pay | Admitting: Family Medicine

## 2019-05-25 ENCOUNTER — Other Ambulatory Visit: Payer: Self-pay

## 2019-05-25 ENCOUNTER — Ambulatory Visit: Payer: BC Managed Care – PPO | Admitting: Internal Medicine

## 2019-05-25 VITALS — BP 128/74 | HR 71 | Temp 97.9°F | Ht 72.0 in | Wt 202.0 lb

## 2019-05-25 DIAGNOSIS — E785 Hyperlipidemia, unspecified: Secondary | ICD-10-CM

## 2019-05-25 DIAGNOSIS — R82998 Other abnormal findings in urine: Secondary | ICD-10-CM | POA: Diagnosis not present

## 2019-05-25 DIAGNOSIS — E538 Deficiency of other specified B group vitamins: Secondary | ICD-10-CM

## 2019-05-25 DIAGNOSIS — M25552 Pain in left hip: Secondary | ICD-10-CM

## 2019-05-25 DIAGNOSIS — Z23 Encounter for immunization: Secondary | ICD-10-CM | POA: Diagnosis not present

## 2019-05-25 DIAGNOSIS — Z113 Encounter for screening for infections with a predominantly sexual mode of transmission: Secondary | ICD-10-CM

## 2019-05-25 DIAGNOSIS — N529 Male erectile dysfunction, unspecified: Secondary | ICD-10-CM

## 2019-05-25 DIAGNOSIS — D751 Secondary polycythemia: Secondary | ICD-10-CM | POA: Diagnosis not present

## 2019-05-25 MED ORDER — PANTOPRAZOLE SODIUM 40 MG PO TBEC: 40.0000 mg | DELAYED_RELEASE_TABLET | Freq: Every day | ORAL | 1 refills | Status: DC

## 2019-05-25 MED ORDER — CYANOCOBALAMIN 1000 MCG/ML IJ SOLN: 1000.0000 ug | INTRAMUSCULAR | 11 refills | Status: DC

## 2019-05-25 MED ORDER — SILDENAFIL CITRATE 20 MG PO TABS: 20.0000 mg | ORAL_TABLET | Freq: Three times a day (TID) | ORAL | 3 refills | Status: DC

## 2019-05-25 MED ORDER — NAPROXEN 500 MG PO TABS: 500.0000 mg | ORAL_TABLET | Freq: Two times a day (BID) | ORAL | 2 refills | Status: DC | PRN
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
## 2019-05-25 NOTE — Patient Instructions (Signed)
° ° ° °  If you have lab work done today you will be contacted with your lab results within the next 2 weeks.  If you have not heard from us then please contact us. The fastest way to get your results is to register for My Chart. ° ° °IF you received an x-ray today, you will receive an invoice from [facility]. Please contact [facility] at [phone] with questions or concerns regarding your invoice.  ° °IF you received labwork today, you will receive an invoice from [facility]. Please contact [facility] at [phone] with questions or concerns regarding your invoice.  ° °Our billing staff will not be able to assist you with questions regarding bills from these companies. ° °You will be contacted with the lab results as soon as they are available. The fastest way to get your results is to activate your My Chart account. Instructions are located on the last page of this paperwork. If you have not heard from us regarding the results in 2 weeks, please contact this office. °  ° ° ° °

## 2019-05-26 LAB — CBC
Hematocrit: 42 % (ref 37.5–51.0)
Hemoglobin: 14.9 g/dL (ref 13.0–17.7)
MCH: 33 pg (ref 26.6–33.0)
MCHC: 35.5 g/dL (ref 31.5–35.7)
MCV: 93 fL (ref 79–97)
Platelets: 137 10*3/uL — ABNORMAL LOW (ref 150–450)
RBC: 4.52 x10E6/uL (ref 4.14–5.80)
RDW: 13.2 % (ref 11.6–15.4)
WBC: 5.5 10*3/uL (ref 3.4–10.8)

## 2019-05-26 LAB — LIPID PANEL
Chol/HDL Ratio: 5.1 ratio — ABNORMAL HIGH (ref 0.0–5.0)
Cholesterol, Total: 235 mg/dL — ABNORMAL HIGH (ref 100–199)
HDL: 46 mg/dL (ref >39)
LDL Chol Calc (NIH): 154 mg/dL — ABNORMAL HIGH (ref 0–99)
Triglycerides: 194 mg/dL — ABNORMAL HIGH (ref 0–149)
VLDL Cholesterol Cal: 35 mg/dL (ref 5–40)

## 2019-05-26 LAB — URINALYSIS, ROUTINE W REFLEX MICROSCOPIC
Bilirubin, UA: NEGATIVE
Glucose, UA: NEGATIVE
Ketones, UA: NEGATIVE
Leukocytes,UA: NEGATIVE
Nitrite, UA: NEGATIVE
Protein,UA: NEGATIVE
RBC, UA: NEGATIVE
Specific Gravity, UA: 1.009 (ref 1.005–1.030)
Urobilinogen, Ur: 1 mg/dL (ref 0.2–1.0)
pH, UA: 6 (ref 5.0–7.5)

## 2019-05-26 LAB — COMPREHENSIVE METABOLIC PANEL
ALT: 19 IU/L (ref 0–44)
AST: 25 IU/L (ref 0–40)
Albumin/Globulin Ratio: 1.3 (ref 1.2–2.2)
Albumin: 4.1 g/dL (ref 3.8–4.9)
Alkaline Phosphatase: 90 IU/L (ref 39–117)
BUN/Creatinine Ratio: 12 (ref 9–20)
BUN: 12 mg/dL (ref 6–24)
Bilirubin Total: 0.3 mg/dL (ref 0.0–1.2)
CO2: 25 mmol/L (ref 20–29)
Calcium: 9.3 mg/dL (ref 8.7–10.2)
Chloride: 99 mmol/L (ref 96–106)
Creatinine, Ser: 0.99 mg/dL (ref 0.76–1.27)
GFR calc Af Amer: 96 mL/min/{1.73_m2} (ref >59)
GFR calc non Af Amer: 83 mL/min/{1.73_m2} (ref >59)
Globulin, Total: 3.1 g/dL (ref 1.5–4.5)
Glucose: 88 mg/dL (ref 65–99)
Potassium: 4.4 mmol/L (ref 3.5–5.2)
Sodium: 141 mmol/L (ref 134–144)
Total Protein: 7.2 g/dL (ref 6.0–8.5)

## 2019-05-26 LAB — RPR: RPR Ser Ql: NONREACTIVE

## 2019-05-26 LAB — HEPATITIS C ANTIBODY: Hep C Virus Ab: <0.1 s/co ratio (ref 0.0–0.9)

## 2019-05-26 LAB — HEPATITIS B SURFACE ANTIGEN: Hepatitis B Surface Ag: NEGATIVE

## 2019-05-26 LAB — VITAMIN B12: Vitamin B-12: 245 pg/mL (ref 232–1245)

## 2019-05-26 LAB — HIV ANTIBODY (ROUTINE TESTING W REFLEX): HIV Screen 4th Generation wRfx: NONREACTIVE

## 2019-05-29 DIAGNOSIS — I5022 Chronic systolic (congestive) heart failure: Secondary | ICD-10-CM | POA: Diagnosis not present

## 2019-05-29 DIAGNOSIS — I2510 Atherosclerotic heart disease of native coronary artery without angina pectoris: Secondary | ICD-10-CM | POA: Diagnosis not present

## 2019-05-29 LAB — URINE CYTOLOGY ANCILLARY ONLY
Chlamydia: NEGATIVE
Comment: NEGATIVE
Comment: NEGATIVE
Comment: NORMAL
Neisseria Gonorrhea: NEGATIVE
Trichomonas: NEGATIVE

## 2019-05-30 ENCOUNTER — Ambulatory Visit: Payer: BC Managed Care – PPO | Admitting: Family Medicine

## 2019-05-30 ENCOUNTER — Telehealth: Payer: Self-pay | Admitting: Family Medicine

## 2019-05-30 NOTE — Telephone Encounter (Signed)
Pt called to request a cb when results are in

## 2019-06-06 ENCOUNTER — Telehealth (HOSPITAL_COMMUNITY): Payer: Self-pay

## 2019-06-06 NOTE — Telephone Encounter (Signed)
COVID-19 pre-appointment screening questions: °  °Do you have a history of COVID-19 or a positive test result in the past 7-10 days? NO ° °To the best of your knowledge, have you been in close contact with anyone with a confirmed diagnosis of COVID 19? NO ° °Have you had any one or more of the following: Fever, chills, cough, shortness of breath (out of the normal for you) or any flu-like symptoms? NO ° °Are you experiencing any of the following symptoms that is new or out of usual for you: NO ° °• Ear, nose or throat discomfort °• Sore throat °• Headache °• Muscle Pain °• Diarrhea °• Loss of taste or smell ° ° °Reviewed all the following with patient: °• Use of hand sanitizer when entering the building °• Everyone is required to wear a mask in the building, if you do not have a mask we are happy to provide you with one when you arrive °• NO Visitor guidelines ° ° °If patient answers YES to any of questions they must change to a virtual visit and place note in comments about symptoms ° °

## 2019-06-07 ENCOUNTER — Ambulatory Visit (HOSPITAL_COMMUNITY)
Admission: RE | Admit: 2019-06-07 | Discharge: 2019-06-07 | Disposition: A | Discharge status: Home / Self Care | Payer: BC Managed Care – PPO | Source: Ambulatory Visit | Attending: Internal Medicine | Admitting: Internal Medicine

## 2019-06-07 ENCOUNTER — Other Ambulatory Visit: Payer: Self-pay

## 2019-06-07 ENCOUNTER — Encounter (HOSPITAL_COMMUNITY): Payer: Self-pay | Admitting: Internal Medicine

## 2019-06-07 VITALS — BP 124/94 | HR 72 | Wt 200.4 lb

## 2019-06-07 DIAGNOSIS — I5022 Chronic systolic (congestive) heart failure: Secondary | ICD-10-CM | POA: Diagnosis not present

## 2019-06-07 DIAGNOSIS — I2510 Atherosclerotic heart disease of native coronary artery without angina pectoris: Secondary | ICD-10-CM | POA: Diagnosis not present

## 2019-06-07 DIAGNOSIS — M48061 Spinal stenosis, lumbar region without neurogenic claudication: Secondary | ICD-10-CM | POA: Insufficient documentation

## 2019-06-07 DIAGNOSIS — F17210 Nicotine dependence, cigarettes, uncomplicated: Secondary | ICD-10-CM | POA: Diagnosis not present

## 2019-06-07 DIAGNOSIS — Z79899 Other long term (current) drug therapy: Secondary | ICD-10-CM | POA: Insufficient documentation

## 2019-06-07 DIAGNOSIS — I5042 Chronic combined systolic (congestive) and diastolic (congestive) heart failure: Secondary | ICD-10-CM | POA: Diagnosis not present

## 2019-06-07 DIAGNOSIS — Z8547 Personal history of malignant neoplasm of testis: Secondary | ICD-10-CM | POA: Insufficient documentation

## 2019-06-07 DIAGNOSIS — F1320 Sedative, hypnotic or anxiolytic dependence, uncomplicated: Secondary | ICD-10-CM | POA: Diagnosis not present

## 2019-06-07 DIAGNOSIS — N400 Enlarged prostate without lower urinary tract symptoms: Secondary | ICD-10-CM | POA: Insufficient documentation

## 2019-06-07 DIAGNOSIS — G4733 Obstructive sleep apnea (adult) (pediatric): Secondary | ICD-10-CM | POA: Insufficient documentation

## 2019-06-07 DIAGNOSIS — Z95810 Presence of automatic (implantable) cardiac defibrillator: Secondary | ICD-10-CM | POA: Diagnosis not present

## 2019-06-07 DIAGNOSIS — Z720 Tobacco use: Secondary | ICD-10-CM | POA: Diagnosis not present

## 2019-06-07 DIAGNOSIS — I480 Paroxysmal atrial fibrillation: Secondary | ICD-10-CM | POA: Insufficient documentation

## 2019-06-07 DIAGNOSIS — D751 Secondary polycythemia: Secondary | ICD-10-CM | POA: Diagnosis not present

## 2019-06-07 DIAGNOSIS — F329 Major depressive disorder, single episode, unspecified: Secondary | ICD-10-CM | POA: Diagnosis not present

## 2019-06-07 DIAGNOSIS — Z8673 Personal history of transient ischemic attack (TIA), and cerebral infarction without residual deficits: Secondary | ICD-10-CM | POA: Diagnosis not present

## 2019-06-07 DIAGNOSIS — E538 Deficiency of other specified B group vitamins: Secondary | ICD-10-CM | POA: Diagnosis not present

## 2019-06-07 DIAGNOSIS — Z955 Presence of coronary angioplasty implant and graft: Secondary | ICD-10-CM | POA: Diagnosis not present

## 2019-06-07 DIAGNOSIS — Z7902 Long term (current) use of antithrombotics/antiplatelets: Secondary | ICD-10-CM | POA: Insufficient documentation

## 2019-06-07 DIAGNOSIS — J9610 Chronic respiratory failure, unspecified whether with hypoxia or hypercapnia: Secondary | ICD-10-CM | POA: Insufficient documentation

## 2019-06-07 DIAGNOSIS — F419 Anxiety disorder, unspecified: Secondary | ICD-10-CM | POA: Diagnosis not present

## 2019-06-07 DIAGNOSIS — I252 Old myocardial infarction: Secondary | ICD-10-CM | POA: Diagnosis not present

## 2019-06-07 DIAGNOSIS — I255 Ischemic cardiomyopathy: Secondary | ICD-10-CM | POA: Diagnosis not present

## 2019-06-07 MED ORDER — LOSARTAN POTASSIUM 25 MG PO TABS: 25.0000 mg | ORAL_TABLET | Freq: Every day | ORAL | 3 refills | Status: DC

## 2019-06-07 MED ORDER — FARXIGA 10 MG PO TABS: 10.0000 mg | ORAL_TABLET | Freq: Every day | ORAL | 6 refills | Status: DC
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
## 2019-06-07 NOTE — Patient Instructions (Signed)
Start Losartan 25 daily AT BEDTIME  Start Farxiga in 10 mg in 2 weeks (06/21/2019) if you tolerate the Losartan well  Your physician has requested that you have an echocardiogram. Echocardiography is a painless test that uses sound waves to create images of your heart. It provides your doctor with information about the size and shape of your heart and how well your heart's chambers and valves are working. This procedure takes approximately one hour. There are no restrictions for this procedure.  We will contact you in 6 months to schedule your next appointment.

## 2019-06-19 ENCOUNTER — Encounter: Payer: BC Managed Care – PPO | Admitting: Family Medicine

## 2019-06-19 ENCOUNTER — Other Ambulatory Visit: Payer: Self-pay

## 2019-06-19 NOTE — Progress Notes (Signed)
Want to discuss the problems he  is having in his legs, says he is asking for letter to show that his legs are in great pain. He is also asking for refill on pended med.

## 2019-06-20 ENCOUNTER — Encounter: Payer: Self-pay | Admitting: Family Medicine

## 2019-06-23 ENCOUNTER — Telehealth: Payer: Self-pay | Admitting: Gastroenterology

## 2019-06-23 NOTE — Telephone Encounter (Signed)
Dr. Loletha Carrow, Juluis Rainier- Spoke to the patient who told the schedulers he wanted to cancel his procedure due to a COVID exposure, when this RN spoke to the patient on the phone, he stated that he in fact had [place]. When this RN gently pressed the patient to see if he was exposed or if he tested positive, he stated "I can't help who I lay in bed with and he was exposed which means I probably have it." The patient did not confirm or deny that he actually tested positive for the virus. This RN told the patient his hospital procedure would be canceled at his request. Per chart documentation, the patient called to cancel his pre-op visit for the procedure with the nurse on 12/4 stating he had a scheduling "conflict." The patient was told due to his repeated cancelling, we would wait on him to call to schedule when he could commit to the procedure without cancelling again. This infuriated the patient who started to raise his voice. "Are you listening to me! You need to listen to what I'm telling you!" "I can't help I had to cancel again! I can't help that 1 in 7 people are being diagnosed with COVID-19." This RN reminded the patient we have had previously pleasant conversations and he was requested to refrain from raising his voice. At this point, the patient abruptly decided to end the phone call.   All associated scheduled appointments with this hospital case have been cancelled.

## 2019-06-23 NOTE — Telephone Encounter (Signed)
Pt needs to r/s his procedure at the hospital that is scheduled  On 12/22 because he was exposed to someone with Covid-19. Pls call him.

## 2019-06-25 IMAGING — CT CT ABD-PELV W/ CM
2 of 5 series · 12 of 46 positions shown, 14 images · IV contrast (omnipaque)
Comparison: 02/20/2014

CLINICAL DATA: History of Crohn's disease and C difficile colitis.
Intractable vomiting, nausea. Microhematuria. 40 lb weight loss.

EXAM:
CT ABDOMEN AND PELVIS WITH CONTRAST
TECHNIQUE: Multidetector CT imaging of the abdomen and pelvis was performed
using the standard protocol following bolus administration of
intravenous contrast.
CONTRAST:  100mL OMNIPAQUE IOHEXOL 300 MG/ML  SOLN

[Series 2: abd pelvis 5.00 br40 s3 ax · axial · 0.60mm/px · z∈[+1060,+1470]mm · 9 of 98 slices shown, 11 images]
[im 8/98  soft-tissue]
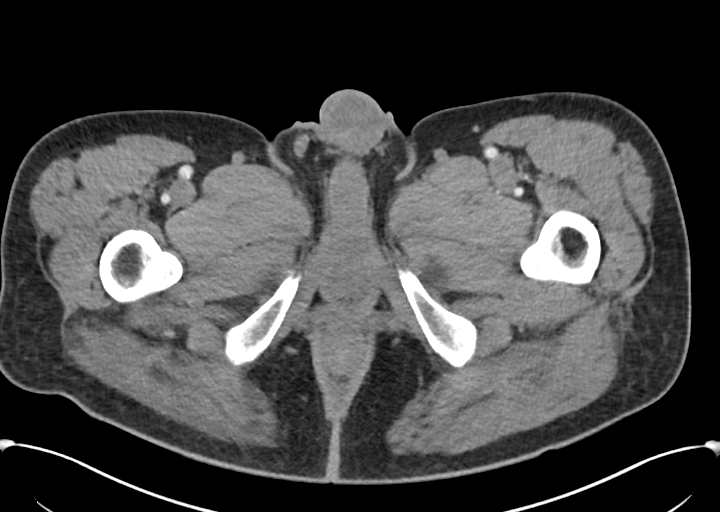
[im 8/98  bone]
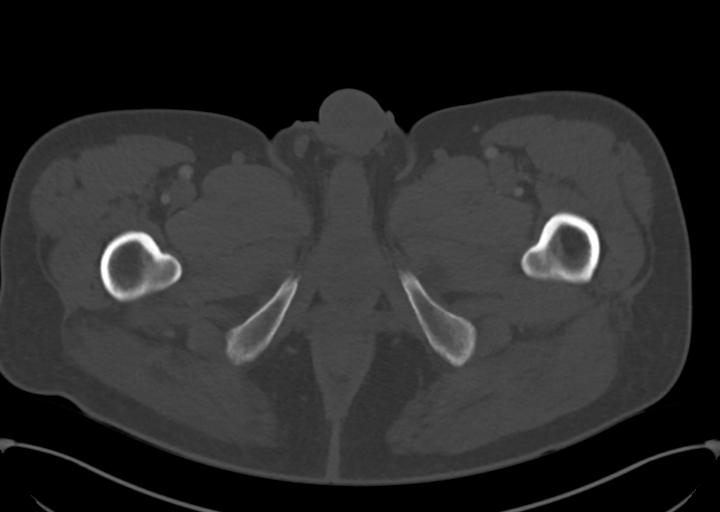
[im 23/98  soft-tissue]
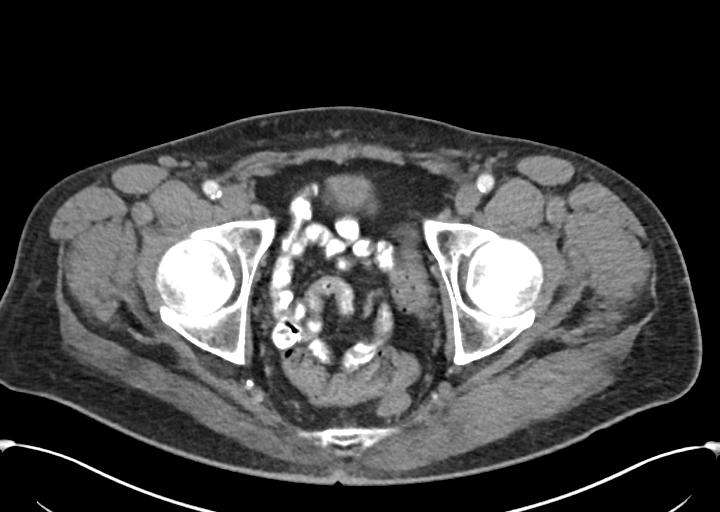
[im 30/98  soft-tissue]
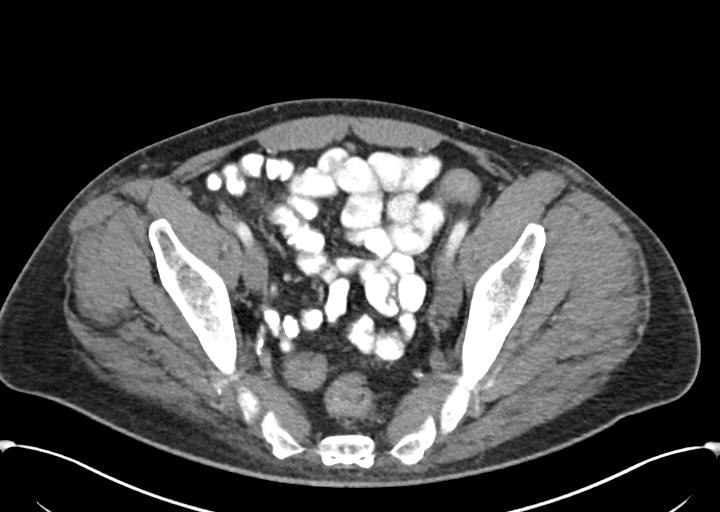
[im 38/98  soft-tissue]
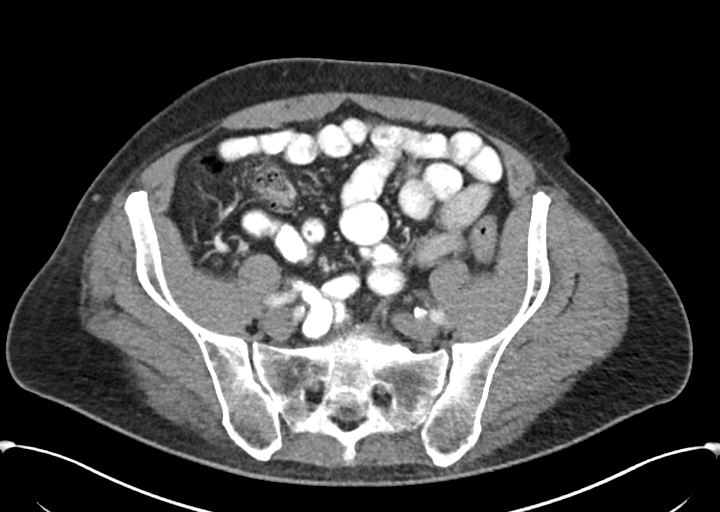
[im 53/98  soft-tissue]
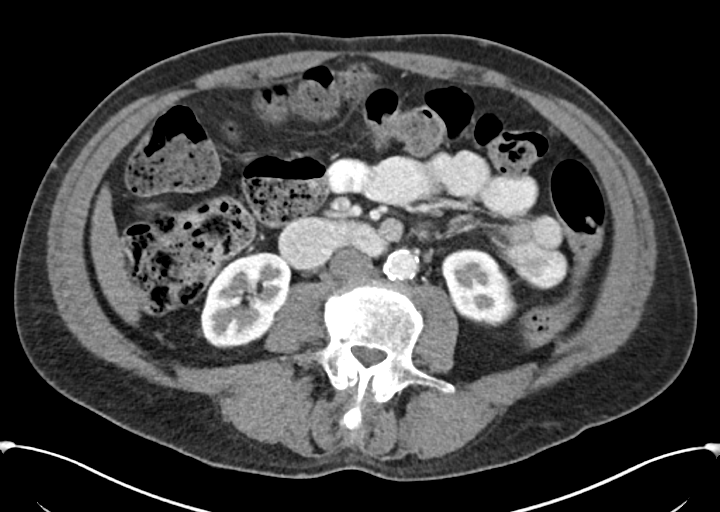
[im 60/98  soft-tissue]
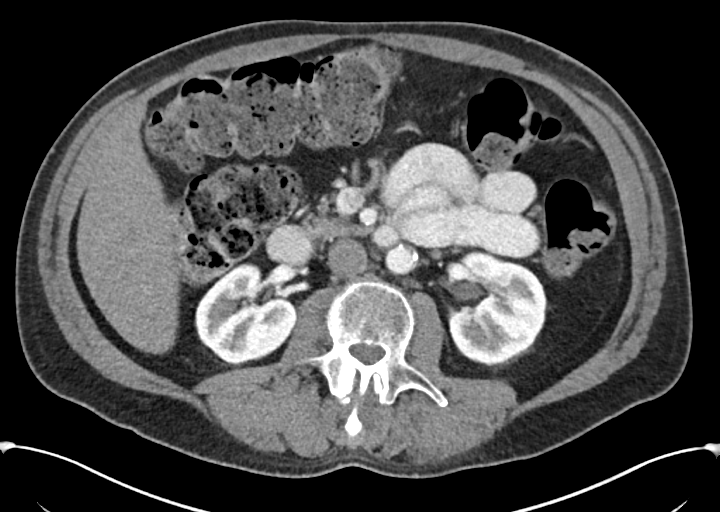
[im 68/98  soft-tissue]
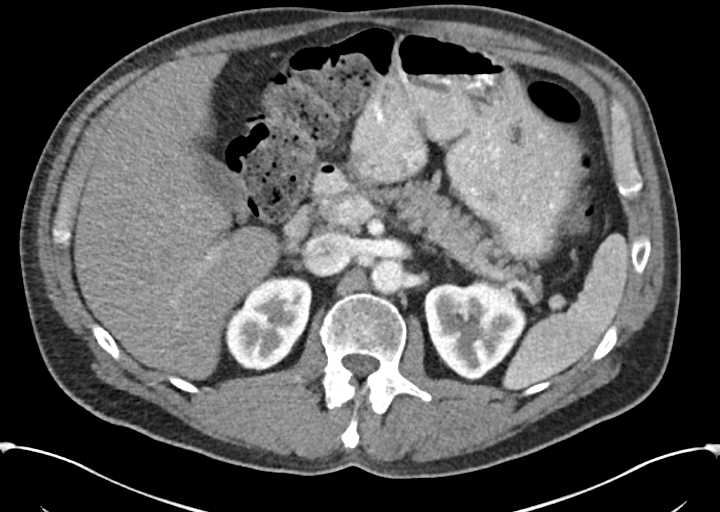
[im 83/98  soft-tissue]
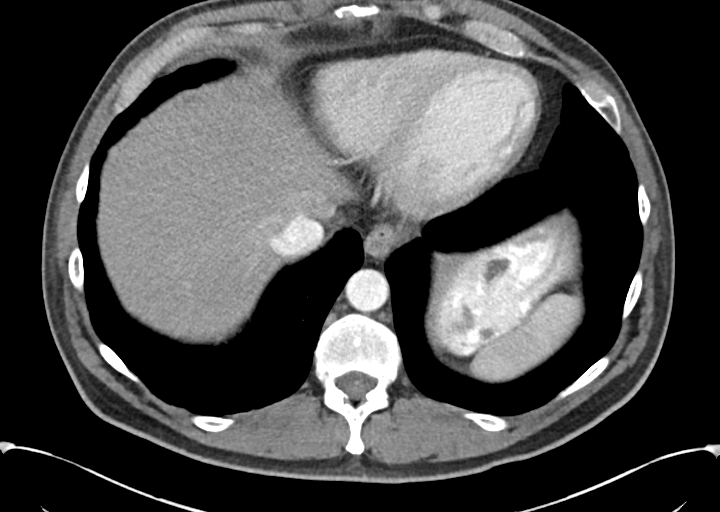
[im 90/98  soft-tissue]
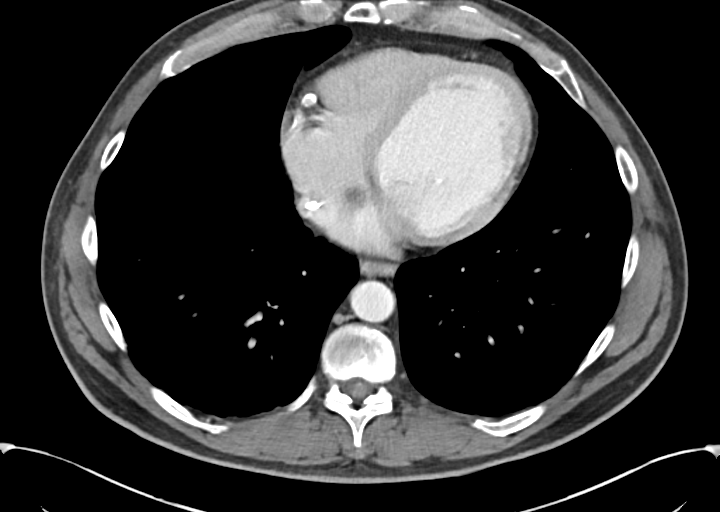
[im 90/98  bone]
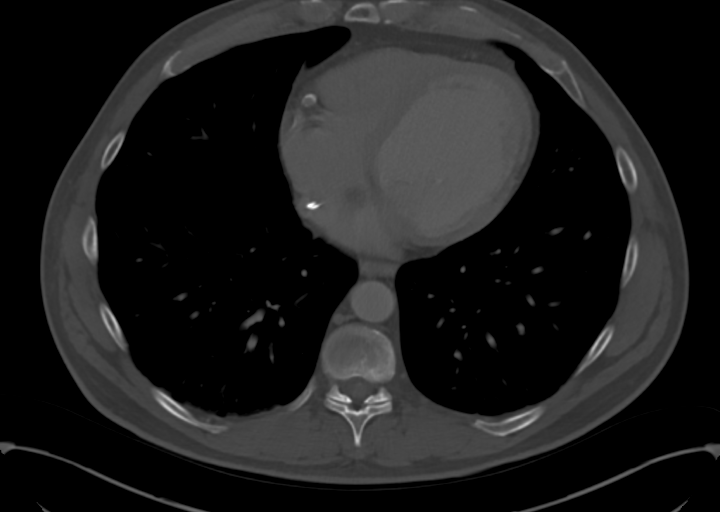

[Series 6: abd pelvis 2.00 br40 s3 cor · coronal · 0.84mm/px · 3 of 153 slices shown]
[im 51/153  soft-tissue]
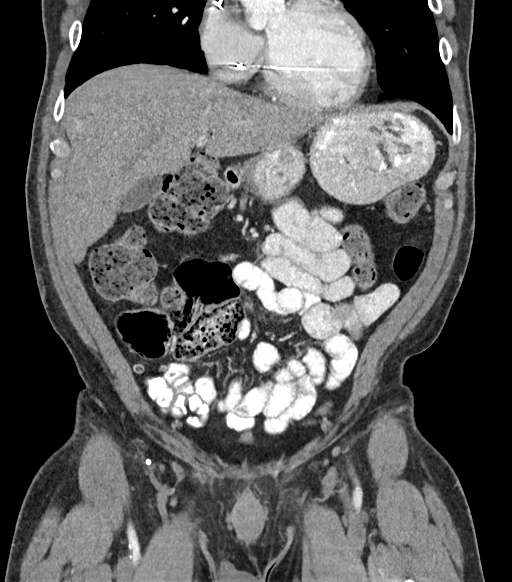
[im 68/153  soft-tissue]
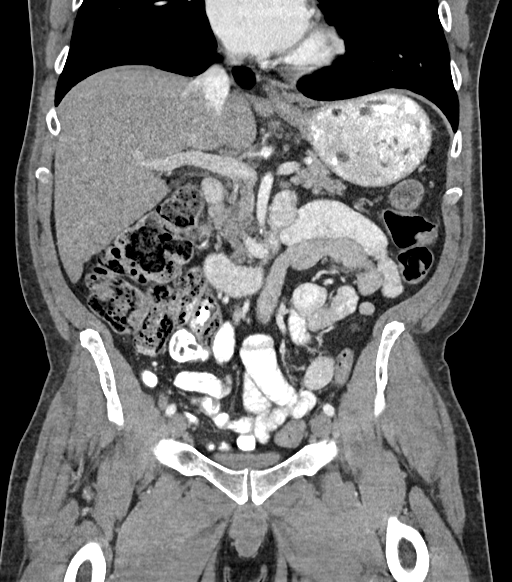
[im 85/153  soft-tissue]
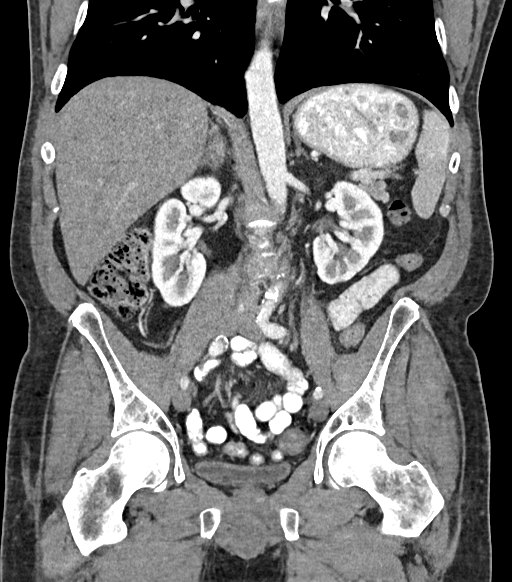

[12 of 46 positions shown; findings below may reference images not displayed]

FINDINGS: Lower chest: Lung bases are clear. No effusions. Heart is normal
size. Pacer wires noted in the right heart. Diffusely calcified
coronary arteries.

Hepatobiliary: No focal hepatic abnormality. Gallbladder
unremarkable.

Pancreas: No focal abnormality or ductal dilatation.

Spleen: No focal abnormality.  Normal size.

Adrenals/Urinary Tract: No adrenal abnormality. No focal renal
abnormality. No stones or hydronephrosis. Urinary bladder is
unremarkable.

Stomach/Bowel: Large stool burden in the colon. Appendix not
definitively seen. No pericecal inflammation. No evidence of bowel
obstruction.

Vascular/Lymphatic: Diffuse aortic atherosclerosis. No evidence of
aneurysm or adenopathy.

Reproductive: No visible focal abnormality.

Other: No free fluid or free air.

Musculoskeletal: Degenerative disc and facet disease in the lower
lumbar spine. No acute bony abnormality.
IMPRESSION: Large stool burden throughout the. No evidence of bowel obstruction
or active inflammation.

Aortic atherosclerosis, coronary artery disease.

No acute findings in the abdomen or pelvis.

## 2019-06-27 ENCOUNTER — Ambulatory Visit (HOSPITAL_COMMUNITY): Payer: BC Managed Care – PPO

## 2019-06-28 NOTE — Telephone Encounter (Signed)
My January date for hospital outpatient procedures is full.  He can go on the waiting list for my February date.  It will be the last opportunity that I am offering for him to have endoscopic procedures with me.  (When you convey this information to him, if he is rude or disrespectful to you, please document it again and let me know.)  - HD

## 2019-06-29 NOTE — Telephone Encounter (Signed)
Attempted to call patient, unable to LM, VM full. Will attempt to call again 12/18.

## 2019-06-30 ENCOUNTER — Other Ambulatory Visit (HOSPITAL_COMMUNITY): Payer: BC Managed Care – PPO

## 2019-06-30 NOTE — Telephone Encounter (Signed)
Attempted to call the patient to relay Dr. Loletha Carrow' message, a young man named [name] answered the phone and preferred to take a message stating, "[name] is currently sleeping and mustn't be disturbed at the moment." This RN left a message with Jorey requesting a call back.

## 2019-07-03 ENCOUNTER — Other Ambulatory Visit (HOSPITAL_COMMUNITY): Payer: Self-pay | Admitting: Internal Medicine

## 2019-07-04 ENCOUNTER — Ambulatory Visit (HOSPITAL_COMMUNITY): Admit: 2019-07-04 | Payer: BC Managed Care – PPO | Admitting: Gastroenterology

## 2019-07-04 ENCOUNTER — Encounter (HOSPITAL_COMMUNITY): Payer: Self-pay

## 2019-07-04 SURGERY — COLONOSCOPY WITH PROPOFOL
Anesthesia: Monitor Anesthesia Care

## 2019-07-06 ENCOUNTER — Ambulatory Visit (INDEPENDENT_AMBULATORY_CARE_PROVIDER_SITE_OTHER): Payer: BC Managed Care – PPO | Admitting: *Deleted

## 2019-07-06 DIAGNOSIS — I5042 Chronic combined systolic (congestive) and diastolic (congestive) heart failure: Secondary | ICD-10-CM | POA: Diagnosis not present

## 2019-07-06 LAB — CUP PACEART REMOTE DEVICE CHECK
Date Time Interrogation Session: 20201224055824
Implantable Lead Implant Date: 20120201
Implantable Lead Implant Date: 20120201
Implantable Lead Location: 753859
Implantable Lead Location: 753860
Implantable Pulse Generator Implant Date: 20120201
Pulse Gen Serial Number: 815096

## 2019-07-09 NOTE — Progress Notes (Signed)
ICD remote 

## 2019-07-10 ENCOUNTER — Ambulatory Visit (HOSPITAL_COMMUNITY): Admission: RE | Admit: 2019-07-10 | Payer: BC Managed Care – PPO | Source: Ambulatory Visit

## 2019-07-17 ENCOUNTER — Encounter: Payer: Self-pay | Admitting: *Deleted

## 2019-07-17 ENCOUNTER — Other Ambulatory Visit: Payer: Self-pay | Admitting: *Deleted

## 2019-07-17 ENCOUNTER — Other Ambulatory Visit (HOSPITAL_COMMUNITY): Payer: BC Managed Care – PPO

## 2019-07-17 DIAGNOSIS — K5909 Other constipation: Secondary | ICD-10-CM

## 2019-07-17 DIAGNOSIS — R1084 Generalized abdominal pain: Secondary | ICD-10-CM

## 2019-07-17 NOTE — Telephone Encounter (Signed)
This RN called Mr. Stauffer and spoke with him concerning rescheduling his procedure again. The patient was pleasant and profusely apologized about his previous cancellations and stated he initially thought the phone call was to discharge him from the practice due to his cancellations. This RN told the patient that this was the last time he would be offered an appointment for an endoscopic procedure with Dr. Loletha Carrow and he must not cancel. The patient stated he would in fact not cancel this appointment and verbalized that he understood this was the last offered date for a procedure with Dr. Loletha Carrow. The patient has been scheduled for the following:   08/17/19 at 2:00 pm previsit with nurse (2 day prep)  08/28/19 at 3:05 pm COVID-19 screening  08/31/19 at 8:00 am colon/ EGD at [facility] (Case ID: [id number])  Letter and MyChart message sent to patient with dates of upcoming procedures.   Cardiac clearance received for this patient on 04/28/19 by Dr. Stann Mainland.

## 2019-07-17 NOTE — Telephone Encounter (Signed)
Thank you for the update, and I agree with plans as outlined.

## 2019-07-18 ENCOUNTER — Ambulatory Visit (HOSPITAL_COMMUNITY)
Admission: RE | Admit: 2019-07-18 | Discharge: 2019-07-18 | Disposition: A | Discharge status: Home / Self Care | Payer: BC Managed Care – PPO | Source: Ambulatory Visit | Attending: Internal Medicine | Admitting: Internal Medicine

## 2019-07-18 ENCOUNTER — Other Ambulatory Visit: Payer: Self-pay

## 2019-07-18 DIAGNOSIS — I252 Old myocardial infarction: Secondary | ICD-10-CM | POA: Insufficient documentation

## 2019-07-18 DIAGNOSIS — R29898 Other symptoms and signs involving the musculoskeletal system: Secondary | ICD-10-CM | POA: Diagnosis not present

## 2019-07-18 DIAGNOSIS — Z95810 Presence of automatic (implantable) cardiac defibrillator: Secondary | ICD-10-CM | POA: Insufficient documentation

## 2019-07-18 DIAGNOSIS — I2510 Atherosclerotic heart disease of native coronary artery without angina pectoris: Secondary | ICD-10-CM | POA: Insufficient documentation

## 2019-07-18 DIAGNOSIS — Z8673 Personal history of transient ischemic attack (TIA), and cerebral infarction without residual deficits: Secondary | ICD-10-CM | POA: Diagnosis not present

## 2019-07-18 DIAGNOSIS — I340 Nonrheumatic mitral (valve) insufficiency: Secondary | ICD-10-CM | POA: Insufficient documentation

## 2019-07-18 DIAGNOSIS — I5042 Chronic combined systolic (congestive) and diastolic (congestive) heart failure: Secondary | ICD-10-CM | POA: Diagnosis not present

## 2019-07-18 DIAGNOSIS — I517 Cardiomegaly: Secondary | ICD-10-CM | POA: Diagnosis not present

## 2019-07-18 DIAGNOSIS — I429 Cardiomyopathy, unspecified: Secondary | ICD-10-CM | POA: Insufficient documentation

## 2019-07-18 NOTE — Progress Notes (Signed)
  Echocardiogram 2D Echocardiogram has been performed.  [name] [name] 07/18/2019, 1:59 PM

## 2019-07-28 DIAGNOSIS — F4322 Adjustment disorder with anxiety: Secondary | ICD-10-CM | POA: Diagnosis not present

## 2019-08-03 ENCOUNTER — Ambulatory Visit: Payer: BC Managed Care – PPO | Admitting: Family Medicine

## 2019-08-04 ENCOUNTER — Telehealth (HOSPITAL_COMMUNITY): Payer: Self-pay

## 2019-08-04 NOTE — Telephone Encounter (Signed)
Pt called to request appt for Dr. Haroldine Laws per Dr. Stann Mainland at [facility]. appt made.

## 2019-08-11 ENCOUNTER — Ambulatory Visit (INDEPENDENT_AMBULATORY_CARE_PROVIDER_SITE_OTHER): Payer: BC Managed Care – PPO

## 2019-08-11 ENCOUNTER — Ambulatory Visit (INDEPENDENT_AMBULATORY_CARE_PROVIDER_SITE_OTHER): Payer: BC Managed Care – PPO | Admitting: Family Medicine

## 2019-08-11 ENCOUNTER — Other Ambulatory Visit: Payer: Self-pay

## 2019-08-11 ENCOUNTER — Encounter: Payer: Self-pay | Admitting: Family Medicine

## 2019-08-11 VITALS — BP 126/74 | HR 70 | Temp 98.0°F | Ht 72.0 in | Wt 202.0 lb

## 2019-08-11 DIAGNOSIS — M25552 Pain in left hip: Secondary | ICD-10-CM | POA: Diagnosis not present

## 2019-08-11 DIAGNOSIS — S79912A Unspecified injury of left hip, initial encounter: Secondary | ICD-10-CM | POA: Diagnosis not present

## 2019-08-11 DIAGNOSIS — M79605 Pain in left leg: Secondary | ICD-10-CM | POA: Diagnosis not present

## 2019-08-11 DIAGNOSIS — S8992XA Unspecified injury of left lower leg, initial encounter: Secondary | ICD-10-CM | POA: Diagnosis not present

## 2019-08-11 MED ORDER — METHOCARBAMOL 500 MG PO TABS: 750.0000 mg | ORAL_TABLET | Freq: Three times a day (TID) | ORAL | 1 refills | Status: DC | PRN

## 2019-08-11 NOTE — Progress Notes (Signed)
1/29/20213:51 PM  [name] May 30, 1961, 59 y.o., male [id number]  Chief Complaint  Patient presents with  . Medical Clearance    asking for medical document for [name] and [name] to be in home care giver for medical trust  . Leg Injury    due to falling,hurt left leg thinks he may have broken it    HPI:   Patient is a 59 y.o. male with past medical history significant for CAD with cardiomyopathy, ICD in place, PAD, complex sleep apnea on bipap with 5L, testicular cancer,lumbar stenosis, narcotic and benzo dependence, COPD, chronic nausea, TB MAC,secondary polycythemia, c diff  who presents today for left leg pain  Patient reports that since incident in oct with his exwife his left leg pain has worsened He reports hip pain and lower leg pain Unable to bear weight  Describes pain as excruciating Only position comfortable is with leg still Sees ortho on Tuesday Using walker Has needed to get help at home as unable to attend to ADLs or IADLs anymore Son of old family friend has been main caregiver Requesting letter so that person can be paid from patient's trust Requesting refill of robaxin which helps with pain  Depression screen [facility] 2/9 05/25/2019 03/03/2019 01/17/2019  Decreased Interest 0 0 0  Down, Depressed, Hopeless 0 0 0  PHQ - 2 Score 0 0 0  Altered sleeping - - -  Tired, decreased energy - - -  Change in appetite - - -  Feeling bad or failure about yourself  - - -  Trouble concentrating - - -  Moving slowly or fidgety/restless - - -  Suicidal thoughts - - -  PHQ-9 Score - - -  Difficult doing work/chores - - -  Some recent data might be hidden    Fall Risk  05/25/2019 03/03/2019 03/03/2019 01/17/2019 10/04/2018  Falls in the past year? _0 Comment - - - - -  Number falls in past yr: _1 Comment - - - - -  Injury with Fall? _2 Comment - - injuries - head, right knee, fx right hip  from the falls back -  Risk Factor Category   - - - - -  Risk for fall due to : Impaired balance/gait;Impaired mobility;Medication side effect;Mental status change;History of fall(s);Other (Comment) - - - History of fall(s);Medication side effect;Mental status change;Other (Comment)  Risk for fall due to: Comment - - - - -  Follow up - Falls evaluation completed - Falls evaluation completed -  Comment - - - - -     Allergies  Allergen Reactions  . Darvocet [Propoxyphene N-Acetaminophen] Anaphylaxis    Throat closes  . Propoxyphene Anaphylaxis and Swelling    Prior to Admission medications   Medication Sig Start Date End Date Taking? Authorizing Provider  albuterol (VENTOLIN HFA) 108 (90 Base) MCG/ACT inhaler Inhale 2 puffs into the lungs every 6 (six) hours as needed for wheezing or shortness of breath. 03/03/19  Yes [name], MD  ALPRAZolam Duanne Moron) 1 MG tablet Take 1 mg by mouth 4 (four) times daily.    Yes [provider]  carvedilol (COREG) 25 MG tablet Take 25 mg by mouth 2 (two) times daily with a meal.   Yes [provider]  clonazePAM (KLONOPIN) 1 MG tablet Take 1 mg by mouth 3 (three) times daily as needed. 06/16/19  Yes [provider]  clopidogrel (PLAVIX) 75 MG tablet Take 1 tablet (75 mg total) by mouth daily. Pt needs to schedule an appt with our office at [phone] for future refills. 04/12/18  Yes Bensimhon, Shaune Pascal, MD  cyanocobalamin (,VITAMIN B-12,) 1000 MCG/ML injection Inject 1 mL (1,000 mcg total) into the muscle every 30 (thirty) days. 05/25/19  Yes [name], MD  dapagliflozin propanediol (FARXIGA) 10 MG TABS tablet Take 10 mg by mouth daily before breakfast. 06/21/19  Yes Bensimhon, Shaune Pascal, MD  diazepam (VALIUM) 10 MG tablet Take 20 mg by mouth every 12 (twelve) hours as needed for anxiety.  06/04/17  Yes [provider]  erythromycin ophthalmic ointment Place 1 application into the right eye at bedtime. 03/03/19  Yes [name], MD  ezetimibe (ZETIA) 10  MG tablet TAKE 1 TABLET EACH DAY. 07/03/19  Yes Bensimhon, Shaune Pascal, MD  ibuprofen (ADVIL,MOTRIN) 200 MG tablet Take 800 mg by mouth every 6 (six) hours as needed for moderate pain.   Yes [provider]  lactulose (CHRONULAC) 10 GM/15ML solution Take 15 mLs (10 g total) by mouth 2 (two) times daily as needed for mild constipation. 08/05/18  Yes Sagardia, Ines Bloomer, MD  lisinopril (PRINIVIL,ZESTRIL) 5 MG tablet Take 5 mg by mouth daily.  07/20/18  Yes [provider]  loperamide (IMODIUM A-D) 2 MG tablet Take 3 mg by mouth 4 (four) times daily as needed for diarrhea or loose stools.   Yes [provider]  losartan (COZAAR) 25 MG tablet Take 1 tablet (25 mg total) by mouth daily. 06/07/19  Yes Bensimhon, Shaune Pascal, MD  methadone (METHADOSE) 40 MG disintegrating tablet Take 200 mg by mouth daily.   Yes [provider]  naproxen (NAPROSYN) 500 MG tablet Take 1 tablet (500 mg total) by mouth 2 (two) times daily as needed for moderate pain. 05/25/19  Yes [name], MD  nitroGLYCERIN (NITROLINGUAL) 0.4 MG/SPRAY spray Place 2 sprays under the tongue every 5 (five) minutes x 3 doses as needed for chest pain.  03/20/15  Yes [provider]  ondansetron (ZOFRAN-ODT) 8 MG disintegrating tablet Take 1 tablet (8 mg total) by mouth every 8 (eight) hours as needed for nausea or vomiting. 03/03/19  Yes [name], MD  oxymetazoline (AFRIN) 0.05 % nasal spray Place 1 spray into both nostrils 2 (two) times daily.   Yes [provider]  pantoprazole (PROTONIX) 40 MG tablet Take 1 tablet (40 mg total) by mouth daily. 05/25/19  Yes [name], MD  polyethylene glycol powder (MIRALAX) 17 GM/SCOOP powder Take 17 g by mouth 2 (two) times daily as needed for mild constipation.   Yes [provider]  rosuvastatin (CRESTOR) 40 MG tablet Take 1 tablet (40 mg total) by mouth every evening. 04/12/18  Yes Bensimhon, Shaune Pascal, MD  sildenafil (REVATIO) 20 MG  tablet Take 100 mg by mouth as needed.   Yes [provider]  spironolactone (ALDACTONE) 25 MG tablet TAKE (1/2) TABLET DAILY. 07/03/19  Yes Bensimhon, Shaune Pascal, MD  sucralfate (CARAFATE) 1 g tablet Take 1 tablet (1 g total) by mouth 4 (four) times daily -  with meals and at bedtime. 03/14/18  Yes Bloomfield, Carley D, DO  tamsulosin (FLOMAX) 0.4 MG CAPS capsule Take 1 capsule (0.4 mg total) by mouth daily after breakfast. 11/22/17  Yes Wardell Honour, MD  TESTOSTERONE CYPIONATE IM Inject into the muscle every 14 (fourteen) days.   Yes [provider]  torsemide (DEMADEX) 20  MG tablet Take 1 tablet (20 mg total) by mouth daily as needed. For swelling in legs 10/07/15  Yes Bensimhon, Shaune Pascal, MD  triamcinolone (NASACORT AQ) 55 MCG/ACT AERO nasal inhaler Place 1 spray into the nose daily. 04/24/16  Yes Chesley Mires, MD  zolpidem (AMBIEN) 10 MG tablet Take 10 mg by mouth at bedtime.  07/11/18  Yes [provider]  traZODone (DESYREL) 100 MG tablet Take 100 mg by mouth at bedtime.     [provider]    Past Medical History:  Diagnosis Date  . Anxiety   . Automatic implantable cardiac defibrillator [employer] Judes    Analyze ST  . Benign prostatic hypertrophy   . Benzodiazepine dependence (HCC)    chronic  . CAD (coronary artery disease)    Last cath 2/12. 3-v CAD. Failed PCI of distal RCAc  CATH [facility] 4/13 with DES to  LAD X2  . CHF (congestive heart failure) ([place])   . Chronic back pain    lumbar stenosis  . Chronic systolic heart failure (HCC)    EF 20-25%. s/p ST. Jude ICD  . Depression   . DJD (degenerative joint disease)   . History of testicular cancer   . Ischemic cardiomyopathy   . Myocardial infarction ([place])   . Narcotic dependence (HCC)    chronic  . OSA on CPAP   . Polycythemia, secondary    S/p hematology consultation for polycythema on 01/06/18.  S/p bone marrow biopsy on 12/31/17 that was negative for myeloproliferative disorder.  Feels secondary  to sleep apnea, active smoking, testosterone supplementation, nocturnal hypoxia  . TIA (transient ischemic attack)   . Vitamin B12 deficiency 07/22/2016    Past Surgical History:  Procedure Laterality Date  . ASD REPAIR, SINUS VENOSUS    . CARDIAC DEFIBRILLATOR PLACEMENT  08/2010  . HERNIA REPAIR    . LEFT HEART CATHETERIZATION WITH CORONARY ANGIOGRAM N/A 06/14/2014   Procedure: LEFT HEART CATHETERIZATION WITH CORONARY ANGIOGRAM;  Surgeon: Jolaine Artist, MD;  Location: [facility] CATH LAB;  Service: Cardiovascular;  Laterality: N/A;  . TESTICLE SURGERY     testicular cancer surgery left testicle removed    Social History   Tobacco Use  . Smoking status: Current Every Day Smoker    Packs/day: 0.50    Years: 29.00    Pack years: 14.50    Types: Cigarettes, E-cigarettes    Last attempt to quit: 07/14/2015    Years since quitting: 4.0  . Smokeless tobacco: Never Used  . Tobacco comment: no cigarette in 4 days but yes to e-cig  Substance Use Topics  . Alcohol use: No    Alcohol/week: 0.0 standard drinks    Family History  Problem Relation Age of Onset  . Heart disease Father 62       Died of MI  . Cancer Mother     ROS Per hpi  OBJECTIVE:  HYIFO'Y DXAJOI   08/11/19 1535  BP: 126/74  Pulse: 70  Temp: 98 F (36.7 C)  SpO2: 99%  Weight: 202 lb (91.6 kg)  Height: 6' (1.829 m)   Body mass index is 27.4 kg/m.   Physical Exam Vitals and nursing note reviewed.  Constitutional:      Appearance: He is well-developed.  HENT:     Head: Normocephalic and atraumatic.  Eyes:     Conjunctiva/sclera: Conjunctivae normal.     Pupils: Pupils are equal, round, and reactive to light.  Pulmonary:     Effort: Pulmonary effort is normal.  Musculoskeletal:        General: Normal range of motion.     Cervical back: Neck supple.  Skin:    General: Skin is warm and dry.  Neurological:     Mental Status: He is alert and oriented to person, place, and time.     Gait: Gait normal.       DG Tibia/Fibula Left  Result Date: 08/11/2019 CLINICAL DATA:  LEFT leg pain after fall EXAM: LEFT TIBIA AND FIBULA - 2 VIEW COMPARISON:  None. FINDINGS: There is no evidence of fracture or other focal bone lesions. Soft tissues are unremarkable. IMPRESSION: No acute osseous abnormality. Electronically Signed   By: Suzy Bouchard M.D.   On: 08/11/2019 16:39   DG Hip Unilat W OR W/O Pelvis 2-3 Views Left  Result Date: 08/11/2019 CLINICAL DATA:  Left hip pain after fall EXAM: DG HIP (WITH OR WITHOUT PELVIS) 2-3V LEFT COMPARISON:  CT 12/29/2017, radiograph 02/25/2014 FINDINGS: Degenerative changes of the lumbar spine. SI joints are non widened. Pubic symphysis and rami are intact. No fracture or malalignment. Joint spaces are maintained IMPRESSION: No acute osseous abnormality. Electronically Signed   By: Donavan Foil M.D.   On: 08/11/2019 16:29     ASSESSMENT and PLAN  1. Left hip pain - DG Hip Unilat W OR W/O Pelvis 2-3 Views Left; Future  2. Left leg pain - DG Tibia/Fibula Left; Future  Other orders - methocarbamol (ROBAXIN) 500 MG tablet; Take 1.5 tablets (750 mg total) by mouth every 8 (eight) hours as needed for muscle spasms.  No fx on xray which was patient's concerns Sees ortho early next week.  Letter given as requested  No follow-ups on file.    [name], MD [facility] [place], [place] [address] Ph.  [phone] Fax [phone]

## 2019-08-11 NOTE — Patient Instructions (Signed)
° ° ° °  If you have lab work done today you will be contacted with your lab results within the next 2 weeks.  If you have not heard from us then please contact us. The fastest way to get your results is to register for My Chart. ° ° °IF you received an x-ray today, you will receive an invoice from [facility]. Please contact [facility] at [phone] with questions or concerns regarding your invoice.  ° °IF you received labwork today, you will receive an invoice from [facility]. Please contact [facility] at [phone] with questions or concerns regarding your invoice.  ° °Our billing staff will not be able to assist you with questions regarding bills from these companies. ° °You will be contacted with the lab results as soon as they are available. The fastest way to get your results is to activate your My Chart account. Instructions are located on the last page of this paperwork. If you have not heard from us regarding the results in 2 weeks, please contact this office. °  ° ° ° °

## 2019-08-14 ENCOUNTER — Encounter (HOSPITAL_COMMUNITY): Payer: Self-pay | Admitting: Internal Medicine

## 2019-08-14 ENCOUNTER — Other Ambulatory Visit: Payer: Self-pay

## 2019-08-14 ENCOUNTER — Ambulatory Visit (HOSPITAL_COMMUNITY)
Admission: RE | Admit: 2019-08-14 | Discharge: 2019-08-14 | Disposition: A | Discharge status: Home / Self Care | Payer: BC Managed Care – PPO | Source: Ambulatory Visit | Attending: Internal Medicine | Admitting: Internal Medicine

## 2019-08-14 VITALS — BP 114/72 | HR 74 | Wt 202.6 lb

## 2019-08-14 DIAGNOSIS — Z8673 Personal history of transient ischemic attack (TIA), and cerebral infarction without residual deficits: Secondary | ICD-10-CM | POA: Diagnosis not present

## 2019-08-14 DIAGNOSIS — Z720 Tobacco use: Secondary | ICD-10-CM

## 2019-08-14 DIAGNOSIS — I252 Old myocardial infarction: Secondary | ICD-10-CM | POA: Diagnosis not present

## 2019-08-14 DIAGNOSIS — M79662 Pain in left lower leg: Secondary | ICD-10-CM | POA: Diagnosis not present

## 2019-08-14 DIAGNOSIS — M1990 Unspecified osteoarthritis, unspecified site: Secondary | ICD-10-CM | POA: Diagnosis not present

## 2019-08-14 DIAGNOSIS — Z7902 Long term (current) use of antithrombotics/antiplatelets: Secondary | ICD-10-CM | POA: Diagnosis not present

## 2019-08-14 DIAGNOSIS — I5022 Chronic systolic (congestive) heart failure: Secondary | ICD-10-CM

## 2019-08-14 DIAGNOSIS — Z955 Presence of coronary angioplasty implant and graft: Secondary | ICD-10-CM | POA: Diagnosis not present

## 2019-08-14 DIAGNOSIS — I255 Ischemic cardiomyopathy: Secondary | ICD-10-CM | POA: Insufficient documentation

## 2019-08-14 DIAGNOSIS — F17210 Nicotine dependence, cigarettes, uncomplicated: Secondary | ICD-10-CM | POA: Diagnosis not present

## 2019-08-14 DIAGNOSIS — J9610 Chronic respiratory failure, unspecified whether with hypoxia or hypercapnia: Secondary | ICD-10-CM | POA: Diagnosis not present

## 2019-08-14 DIAGNOSIS — F419 Anxiety disorder, unspecified: Secondary | ICD-10-CM | POA: Diagnosis not present

## 2019-08-14 DIAGNOSIS — Z95810 Presence of automatic (implantable) cardiac defibrillator: Secondary | ICD-10-CM | POA: Insufficient documentation

## 2019-08-14 DIAGNOSIS — R531 Weakness: Secondary | ICD-10-CM | POA: Insufficient documentation

## 2019-08-14 DIAGNOSIS — Z7984 Long term (current) use of oral hypoglycemic drugs: Secondary | ICD-10-CM | POA: Diagnosis not present

## 2019-08-14 DIAGNOSIS — D751 Secondary polycythemia: Secondary | ICD-10-CM | POA: Diagnosis not present

## 2019-08-14 DIAGNOSIS — Z01810 Encounter for preprocedural cardiovascular examination: Secondary | ICD-10-CM

## 2019-08-14 DIAGNOSIS — I2510 Atherosclerotic heart disease of native coronary artery without angina pectoris: Secondary | ICD-10-CM

## 2019-08-14 DIAGNOSIS — Z79899 Other long term (current) drug therapy: Secondary | ICD-10-CM | POA: Insufficient documentation

## 2019-08-14 DIAGNOSIS — F329 Major depressive disorder, single episode, unspecified: Secondary | ICD-10-CM | POA: Diagnosis not present

## 2019-08-14 DIAGNOSIS — G4733 Obstructive sleep apnea (adult) (pediatric): Secondary | ICD-10-CM | POA: Diagnosis not present

## 2019-08-14 NOTE — Patient Instructions (Signed)
No bloodwork done today.  No medication changes were made. Please continue all medications as prescribed.  Your physician recommends that you schedule a follow-up appointment in: 4 months with Dr. Haroldine Laws  At the [facility], you and your health needs are our priority. As part of our continuing mission to provide you with exceptional heart care, we have created designated Provider Care Teams. These Care Teams include your primary Cardiologist (physician) and Advanced Practice Providers (APPs- Physician Assistants and Nurse Practitioners) who all work together to provide you with the care you need, when you need it.   You may see any of the following providers on your designated Care Team at your next follow up: Marland Kitchen Dr Glori Bickers . Dr Loralie Champagne . Darrick Grinder, NP . Lyda Jester, PA . Audry Riles, PharmD   Please be sure to bring in all your medications bottles to every appointment.

## 2019-08-14 NOTE — Progress Notes (Signed)
Patient ID: [name], male   DOB: 11/12/1960, 58 y.o.   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  Left main: Normal LAD coursed to the apex, gave off a moderate-sized diagonal branch in the midsection. There was mild plaque throughout the proximal LAD. In the mid LAD there was a widely patent stent. Just after the stent there was a 40-50% stenosis. There was mild plaque in the diagonal. Left circumflex: gave off a ramus branch, small OM-1. The AV groove circumflex was totally occluded in the midsection which was chronic. In the ramus branch, there was evidence of a previously placed stent which is chronically subtotally occluded with faint flow in the distal vessel. In the OM-1, there was a 20% proximal lesion. The OM-1 gave collaterals to a small OM-2 Right coronary artery:was a large dominant vessel, had diffuse 40% disease throughout the proximal and midsection.In the distal RCA just after the takeoff of the PDA, there was a chronic 99% lesion with a near subtotal occlusion.There were left to right collaterals filling the distal RCA. (Previously failed PCI of distal RCA. Anatomy not favorable for CABG.) LV-gram done in the RAO projection: Ejection fraction = 20-25% with akinesis of the inferior wall and global HK elsewhere.   He is s/p St. Jude  ICD implantation. Was enrolled in [employer].   Has been followed recently by Dr. Rogers at [facility]. Underwent cath at [facility] in 3/15 and had 2.5x18mm Xience DES placed in LAD. Had cath 12/15 here with stable CAD.   Echo 8/19; EF 30-35% (stable) mild RV dilation.  Echo 07/18/19 EF 40-45% mild mR  Here for f/u to discuss his back and leg pain and if he can have suregery. He  appears intoxicated again. Says he is falling all the time. Both legs are weak. Felt like he had a rubber band pop in his back a month or two ago.. Now can't stand. Having severe left lower leg pain. No angina. Occasional edema. And will take torsemide 10 as needed. Toelrating Farxiga and losartan well. SBP 110s. Denies orthopnea or PND. Occasional mild edema.   ROS: All systems negative except as listed in HPI, PMH and Problem List.  Past Medical History:  Diagnosis Date  . Anxiety   . Automatic implantable cardiac defibrillator St Judes    Analyze ST  . Benign prostatic hypertrophy   . Benzodiazepine dependence (HCC)    chronic  . CAD (coronary artery disease)    Last cath 2/12. 3-v CAD. Failed PCI of distal RCAc  CATH [facility] 4/13 with DES to  LAD X2  . CHF (congestive heart failure) (HCC)   . Chronic back pain    lumbar stenosis  . Chronic systolic heart failure (HCC)    EF 20-25%. s/p ST. Jude ICD  . Depression   . DJD (degenerative joint disease)   . History of testicular cancer   . Ischemic cardiomyopathy   . Myocardial infarction (HCC)   . Narcotic dependence (HCC)    chronic  . OSA on CPAP   . Polycythemia, secondary    S/p hematology consultation for polycythema on 01/06/18.  S/p bone marrow biopsy on 12/31/17 that was negative for myeloproliferative disorder.  Feels secondary to sleep apnea, active   smoking, testosterone supplementation, nocturnal hypoxia  . TIA (transient ischemic attack)   . Vitamin B12 deficiency 07/22/2016    Current Outpatient Medications  Medication Sig Dispense Refill  . albuterol (VENTOLIN HFA) 108 (90 Base) MCG/ACT inhaler Inhale 2 puffs into the lungs every 6 (six) hours as needed for wheezing or shortness of breath. 18 g 5  . ALPRAZolam (XANAX) 1 MG tablet Take 1 mg by mouth 4 (four) times daily.     . carvedilol (COREG) 25 MG tablet Take 25 mg by mouth 2 (two) times daily with a meal.    . clonazePAM (KLONOPIN) 1 MG tablet Take 1 mg by mouth 3  (three) times daily as needed.    . clopidogrel (PLAVIX) 75 MG tablet Take 1 tablet (75 mg total) by mouth daily. Pt needs to schedule an appt with our office at [phone] for future refills. 30 tablet 2  . cyanocobalamin (,VITAMIN B-12,) 1000 MCG/ML injection Inject 1 mL (1,000 mcg total) into the muscle every 30 (thirty) days. 1 mL 11  . dapagliflozin propanediol (FARXIGA) 10 MG TABS tablet Take 10 mg by mouth daily before breakfast. 30 tablet 6  . diazepam (VALIUM) 10 MG tablet Take 20 mg by mouth every 12 (twelve) hours as needed for anxiety.   2  . erythromycin ophthalmic ointment Place 1 application into the right eye at bedtime. 3.5 g 1  . ezetimibe (ZETIA) 10 MG tablet TAKE 1 TABLET EACH DAY. 90 tablet 0  . ibuprofen (ADVIL,MOTRIN) 200 MG tablet Take 800 mg by mouth every 6 (six) hours as needed for moderate pain.    Marland Kitchen lactulose (CHRONULAC) 10 GM/15ML solution Take 15 mLs (10 g total) by mouth 2 (two) times daily as needed for mild constipation. 1892 mL 0  . lisinopril (PRINIVIL,ZESTRIL) 5 MG tablet Take 5 mg by mouth daily.     Marland Kitchen loperamide (IMODIUM A-D) 2 MG tablet Take 3 mg by mouth 4 (four) times daily as needed for diarrhea or loose stools.    Marland Kitchen losartan (COZAAR) 25 MG tablet Take 1 tablet (25 mg total) by mouth daily. 90 tablet 3  . methadone (METHADOSE) 40 MG disintegrating tablet Take 200 mg by mouth daily.    . methocarbamol (ROBAXIN) 500 MG tablet Take 1.5 tablets (750 mg total) by mouth every 8 (eight) hours as needed for muscle spasms. 90 tablet 1  . naproxen (NAPROSYN) 500 MG tablet Take 1 tablet (500 mg total) by mouth 2 (two) times daily as needed for moderate pain. 60 tablet 2  . nitroGLYCERIN (NITROLINGUAL) 0.4 MG/SPRAY spray Place 2 sprays under the tongue every 5 (five) minutes x 3 doses as needed for chest pain.     Marland Kitchen ondansetron (ZOFRAN-ODT) 8 MG disintegrating tablet Take 1 tablet (8 mg total) by mouth every 8 (eight) hours as needed for nausea or vomiting. 90 tablet  1  . oxymetazoline (AFRIN) 0.05 % nasal spray Place 1 spray into both nostrils 2 (two) times daily.    . pantoprazole (PROTONIX) 40 MG tablet Take 1 tablet (40 mg total) by mouth daily. 90 tablet 1  . polyethylene glycol powder (MIRALAX) 17 GM/SCOOP powder Take 17 g by mouth 2 (two) times daily as needed for mild constipation.    . rosuvastatin (CRESTOR) 40 MG tablet Take 1 tablet (40 mg total) by mouth every evening. 30 tablet 2  . sildenafil (REVATIO) 20 MG tablet Take 100 mg by mouth as needed.    Marland Kitchen spironolactone (ALDACTONE) 25 MG tablet TAKE (  1/2) TABLET DAILY. 45 tablet 0  . sucralfate (CARAFATE) 1 g tablet Take 1 tablet (1 g total) by mouth 4 (four) times daily -  with meals and at bedtime. 120 tablet 0  . tamsulosin (FLOMAX) 0.4 MG CAPS capsule Take 1 capsule (0.4 mg total) by mouth daily after breakfast. 90 capsule 0  . TESTOSTERONE CYPIONATE IM Inject into the muscle every 14 (fourteen) days.    Marland Kitchen torsemide (DEMADEX) 20 MG tablet Take 1 tablet (20 mg total) by mouth daily as needed. For swelling in legs 90 tablet 2  . traZODone (DESYREL) 100 MG tablet Take 100 mg by mouth at bedtime.     . triamcinolone (NASACORT AQ) 55 MCG/ACT AERO nasal inhaler Place 1 spray into the nose daily. 1 Inhaler 12  . zolpidem (AMBIEN) 10 MG tablet Take 10 mg by mouth at bedtime.      No current facility-administered medications for this encounter.    PHYSICAL EXAM: Vitals:   08/14/19 1202  BP: 114/72  Pulse: 74  SpO2: 96%   General:  Sitting in WC. No resp difficulty. Disshelved. Appears intoxicated HEENT: normal Neck: supple. no JVD. Carotids 2+ bilat; no bruits. No lymphadenopathy or thryomegaly appreciated. Cor: PMI nondisplaced. Regular rate & rhythm. No rubs, gallops or murmurs. Lungs: clear Abdomen: soft, nontender, nondistended. No hepatosplenomegaly. No bruits or masses. Good bowel sounds. Extremities: no cyanosis, clubbing, rash, edema RLE appears more atrophied than left Neuro: alert &  orientedx3, cranial nerves grossly intact. moves all 4 extremities w/o difficulty. Affect pleasant    ASSESSMENT & PLAN:  1. Chronic systolic HF - Echo 3/50 EF 30-35%. S/p STJ ICD - Echo 07/18/19 EF 40-45% - Stable NYHA III - assessment limited by ortho issues. Volume status ok - At last visit started losartan 25 and Farxiga 10 tolerating well - Continue carvedilol and spiro.  - ICD interrogated personally in clinic. No VT/AF 2. CAD with ischemic CM - No s/s is chemia - Continue DAPT, statin and b-blocker 3. Polycythemia  - Followed by Dr. Annabelle Harman at [facility] and felt to be due to chronic hypoxia from OSA. Encouraged ongoing CPAP use - Recent labs improved 4. Chronic respiratory failure - now off O2 5. Tobacco use - continues to smoke 2-3 cigs/day. Not planning on quitting. No change 6. Pre-op CV assessment - Likely moderate risk for peri-op CV complications but this is certainly not prohibitive especially with stable/improved EF - He asked me about role of repeating his heart cath. He is not very active but is not having anginal symptoms. I do not think cath will appreciably change his risk and if he received a stent it would delay surgery at least 6 months and he says he cannot tolerate the pain that long - Would need to stop Plavix at least 1 week prior to surgery - He will see Dr. Noemi Chapel tomorrow. I asked him to have Dr. Noemi Chapel reach out to me if they are planning surgery.   Total time spent 45 minutes. Over half that time spent discussing above.   Kailan Laws,MD 12:02 PM

## 2019-08-15 ENCOUNTER — Other Ambulatory Visit: Payer: Self-pay | Admitting: Orthopedic Surgery

## 2019-08-15 DIAGNOSIS — M545 Low back pain, unspecified: Secondary | ICD-10-CM

## 2019-08-15 DIAGNOSIS — M25552 Pain in left hip: Secondary | ICD-10-CM | POA: Diagnosis not present

## 2019-08-17 ENCOUNTER — Ambulatory Visit (AMBULATORY_SURGERY_CENTER): Payer: Self-pay | Admitting: *Deleted

## 2019-08-17 ENCOUNTER — Telehealth: Payer: Self-pay | Admitting: *Deleted

## 2019-08-17 DIAGNOSIS — Z01818 Encounter for other preprocedural examination: Secondary | ICD-10-CM

## 2019-08-17 DIAGNOSIS — R634 Abnormal weight loss: Secondary | ICD-10-CM

## 2019-08-17 DIAGNOSIS — R110 Nausea: Secondary | ICD-10-CM

## 2019-08-17 DIAGNOSIS — R1084 Generalized abdominal pain: Secondary | ICD-10-CM

## 2019-08-17 MED ORDER — NA SULFATE-K SULFATE-MG SULF 17.5-3.13-1.6 GM/177ML PO SOLN: 1.0000 | Freq: Once | ORAL | 0 refills | Status: DC

## 2019-08-17 NOTE — Telephone Encounter (Signed)
Understood. I am still concerned he may change his mind again on this.  He has already been made aware that this is his last opportunity to have a procedure or any further care with [facility].

## 2019-08-18 ENCOUNTER — Encounter: Payer: Self-pay | Admitting: Gastroenterology

## 2019-08-18 MED ORDER — NA SULFATE-K SULFATE-MG SULF 17.5-3.13-1.6 GM/177ML PO SOLN: 1.0000 | Freq: Once | ORAL | 0 refills | Status: AC

## 2019-08-21 ENCOUNTER — Telehealth: Payer: Self-pay | Admitting: Physical Medicine & Rehabilitation

## 2019-08-21 NOTE — Telephone Encounter (Signed)
Pt was discharged in 2018 will not reschedule

## 2019-08-21 NOTE — Telephone Encounter (Signed)
Patient request a consult.  Received pop up that patient was discharged 11/2016.

## 2019-08-22 ENCOUNTER — Ambulatory Visit
Admission: RE | Admit: 2019-08-22 | Discharge: 2019-08-22 | Disposition: A | Discharge status: Home / Self Care | Payer: BC Managed Care – PPO | Source: Ambulatory Visit | Attending: Orthopedic Surgery | Admitting: Orthopedic Surgery

## 2019-08-22 DIAGNOSIS — M545 Low back pain, unspecified: Secondary | ICD-10-CM

## 2019-08-22 NOTE — Progress Notes (Signed)
Pre-op endo call attempted. No answer. Lmtcb.

## 2019-08-28 ENCOUNTER — Other Ambulatory Visit: Payer: Self-pay | Admitting: *Deleted

## 2019-08-28 ENCOUNTER — Other Ambulatory Visit (HOSPITAL_COMMUNITY)
Admission: RE | Admit: 2019-08-28 | Discharge: 2019-08-28 | Disposition: A | Discharge status: Home / Self Care | Payer: BC Managed Care – PPO | Source: Ambulatory Visit | Attending: Gastroenterology | Admitting: Gastroenterology

## 2019-08-28 DIAGNOSIS — Z20822 Contact with and (suspected) exposure to covid-19: Secondary | ICD-10-CM | POA: Diagnosis not present

## 2019-08-28 DIAGNOSIS — Z01812 Encounter for preprocedural laboratory examination: Secondary | ICD-10-CM | POA: Diagnosis not present

## 2019-08-28 LAB — SARS CORONAVIRUS 2 (TAT 6-24 HRS): SARS Coronavirus 2: NEGATIVE

## 2019-08-30 ENCOUNTER — Encounter: Payer: Self-pay | Admitting: Gastroenterology

## 2019-08-30 ENCOUNTER — Telehealth: Payer: Self-pay | Admitting: Gastroenterology

## 2019-08-30 NOTE — Telephone Encounter (Signed)
Understood, thank you for letting me know.

## 2019-08-30 NOTE — Telephone Encounter (Signed)
I have attempted to reach the patient to notify him of Dr. Corena Pilgrim decision.  The procedure has been cancelled at [facility] for tomorrow.  The charge nurse of the [facility] endoscopy unit is aware of the situation if the patient does show up.    The patient has a VM that is full.  I will continue to try and reach the patient this afternoon.

## 2019-08-30 NOTE — Telephone Encounter (Signed)
I was able to reach the patient and relay the instructions and response  from Dr. Loletha Carrow.  The patient was pleasant, but argumentative about his symptoms and the reason for the procedure cancellation. He stated he was simply calling to ask questions about how he would be sedated and what would keep him breathing, but reported SOB, CP, and the inability to walk since stopping his Plavix 4 days ago.   He was advised to resume his Plavix as the procedure for tomorrow has been cancelled and that he should proceed to the ED for evaluation of his CP and SOB.  He reported that he had been in contact with Dr. Asencion Noble and his CHF clinic and that there was no need to go to the ED.  He repeated his extensive instructions about how to take his nitroglycerine and when to go to the ED.    I relayed that he was being formally discharged by Dr. Loletha Carrow and [facility] for his rude behavior toward the staff.  He disputes that he was rude today.  We discussed that there have been multiple interactions with rude behavior from him in the past.  He states that he will not be kicked out of [facility] and that he will call Sam, Cornelia Copa, and Remo Lipps and "get this taken care of"  He went on to state that Dr. Loletha Carrow did not have the authority to discharge him from [place]. I stressed that Dr. Loletha Carrow had the decision to dismiss him from his care and the Gastro division, but this did not effect the other [place] divisions.    I will prepare the discharge paperwork and put it on your desk on Friday  He verbalized understanding of the information, but "this was not over".  He understands that the procedure for tomorrow has been cancelled and will not be rescheduled by our office. Our conversation was greater than 15 minutes and his thoughts were very scattered and rambling.  He jumped from subject to subject and was very repetitive.

## 2019-08-30 NOTE — Telephone Encounter (Signed)
Thank you for informing me about this.  I am sorry to hear he is not feeling well, and I is chronically unhappy with scheduling and other issues.  I am equally sorry to hear that he was again rude and difficult, as he has been in the past and documented in prior notes.  Please have our office manager present while you call this patient now and inform him that the procedure for tomorrow is canceled for 2 reasons:  1 - He reports having chest pain and reports breathing difficulties, and I therefore recommend he be seen immediately in the emergency department.  It is his choice whether or not to follow that advice.  2 - I am discharging him from the [facility] given his intolerable behavior toward our nursing staff.    My decision is final.  While I expect he will be upset to hear that, I am prepared to discuss with and defend my decision to anyone in the [facility] administration whom he might contact to discuss this matter.

## 2019-08-30 NOTE — Telephone Encounter (Addendum)
Interim director on zoom call, alternately Barb Merino RN nurse manager made aware due to short time constraints.   Patient called, unable to LM, voicemail full.   Matter turned over to Barb Merino RN, Midwife who has been made aware of the situation. Barb Merino will attempt to continue to call and speak with the patient prior to close today and notify the patient of Dr. Loletha Carrow' decision.

## 2019-08-30 NOTE — Telephone Encounter (Signed)
Thank you for letting me know, and also for informing [facility] nursing.  I had also informed them earlier.

## 2019-08-30 NOTE — Telephone Encounter (Signed)
Pt is scheduled for a hospital colonoscopy 08/31/19.  Pt reported that he has congestive heart failure and has been experiencing chest pain since he has stopped Plavix four days ago.    He is concerned about positive air flow while he is sedated.  He reported: "Every few minutes, I stop breathing and my O2 goes down to 70, heart rate down to 29 bpm until pacemaker kicks in."    Pt stated that he has no feelings in his legs and cannot walk.  He is also concerned due to weather.  Please call pt back to discuss.
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
## 2019-08-30 NOTE — Telephone Encounter (Signed)
I sent the patient a MyChart message asking the patient to call me back and that the procedure will not be performed tomorrow.

## 2019-08-31 ENCOUNTER — Ambulatory Visit (HOSPITAL_COMMUNITY)
Admission: RE | Admit: 2019-08-31 | Payer: BC Managed Care – PPO | Source: Home / Self Care | Admitting: Gastroenterology

## 2019-08-31 ENCOUNTER — Encounter (HOSPITAL_COMMUNITY): Admission: RE | Payer: Self-pay | Source: Home / Self Care

## 2019-08-31 SURGERY — COLONOSCOPY WITH PROPOFOL
Anesthesia: Monitor Anesthesia Care

## 2019-09-01 NOTE — Telephone Encounter (Signed)
Letter and form created for your signature

## 2019-09-05 ENCOUNTER — Telehealth: Payer: Self-pay | Admitting: Gastroenterology

## 2019-09-05 NOTE — Telephone Encounter (Signed)
Patient dismissed from [facility] by Wilfrid Lund, MD effective 09/01/19. Dismissal Letter sent out by 1st class mail. KLM

## 2019-09-07 DIAGNOSIS — M48061 Spinal stenosis, lumbar region without neurogenic claudication: Secondary | ICD-10-CM | POA: Diagnosis not present

## 2019-09-07 DIAGNOSIS — M47816 Spondylosis without myelopathy or radiculopathy, lumbar region: Secondary | ICD-10-CM | POA: Diagnosis not present

## 2019-09-07 DIAGNOSIS — M545 Low back pain: Secondary | ICD-10-CM | POA: Diagnosis not present

## 2019-09-07 DIAGNOSIS — M5136 Other intervertebral disc degeneration, lumbar region: Secondary | ICD-10-CM | POA: Diagnosis not present

## 2019-09-07 DIAGNOSIS — M4316 Spondylolisthesis, lumbar region: Secondary | ICD-10-CM | POA: Diagnosis not present

## 2019-09-15 ENCOUNTER — Other Ambulatory Visit: Payer: Self-pay | Admitting: Rehabilitation

## 2019-09-15 ENCOUNTER — Telehealth: Payer: Self-pay | Admitting: Nurse Practitioner

## 2019-09-15 DIAGNOSIS — M48061 Spinal stenosis, lumbar region without neurogenic claudication: Secondary | ICD-10-CM

## 2019-09-15 NOTE — Telephone Encounter (Signed)
Phone call to patient to verify medication list and allergies for myelogram procedure. Pt instructed to hold trazodone for 48hrs prior to myelogram appointment time. Pt also aware he will need to hold Plavix for this procedure, pending approval and recommended hold time from his cardiologist, Dr. Glori Bickers. Pt verbalized understanding. Pre and post procedure instructions reviewed with pt. Pt aware he will need to lie flat for 24hrs after this procedure. Thinner hold request faxed to Dr. Haroldine Laws, awaiting reply.

## 2019-09-18 ENCOUNTER — Telehealth: Payer: Self-pay

## 2019-09-19 ENCOUNTER — Telehealth (HOSPITAL_COMMUNITY): Payer: Self-pay | Admitting: *Deleted

## 2019-09-19 NOTE — Telephone Encounter (Signed)
Received fax from [place], pt needs a myelogram procedure and they need clearance for him to stop his Plavix 5 days prior.  Per Dr Haroldine Laws authorization granted for this injection only  Note faxed back to them at [phone]

## 2019-09-27 ENCOUNTER — Ambulatory Visit
Admission: RE | Admit: 2019-09-27 | Discharge: 2019-09-27 | Disposition: A | Discharge status: Home / Self Care | Payer: BC Managed Care – PPO | Source: Ambulatory Visit | Attending: Rehabilitation | Admitting: Rehabilitation

## 2019-09-27 ENCOUNTER — Other Ambulatory Visit: Payer: Self-pay

## 2019-09-27 DIAGNOSIS — M48061 Spinal stenosis, lumbar region without neurogenic claudication: Secondary | ICD-10-CM

## 2019-09-27 DIAGNOSIS — M5126 Other intervertebral disc displacement, lumbar region: Secondary | ICD-10-CM | POA: Diagnosis not present

## 2019-09-27 MED ORDER — IOPAMIDOL (ISOVUE-M 200) INJECTION 41%
18.0000 mL | Freq: Once | INTRAMUSCULAR | Status: AC
  Administered 2019-09-27: 18 mL via INTRATHECAL

## 2019-09-27 NOTE — Discharge Instructions (Signed)
Myelogram Discharge Instructions  1. Go home and rest quietly for the next 24 hours.  It is important to lie flat for the next 24 hours.  Get up only to go to the restroom.  You may lie in the bed or on a couch on your back, your stomach, your left side or your right side.  You may have one pillow under your head.  You may have pillows between your knees while you are on your side or under your knees while you are on your back.  2. DO NOT drive today.  Recline the seat as far back as it will go, while still wearing your seat belt, on the way home.  3. You may get up to go to the bathroom as needed.  You may sit up for 10 minutes to eat.  You may resume your normal diet and medications unless otherwise indicated.  Drink plenty of extra fluids today and tomorrow.  4. The incidence of a spinal headache with nausea and/or vomiting is about 5% (one in 20 patients).  If you develop a headache, lie flat and drink plenty of fluids until the headache goes away.  Caffeinated beverages may be helpful.  If you develop severe nausea and vomiting or a headache that does not go away with flat bed rest, call [phone].  5. You may resume normal activities after your 24 hours of bed rest is over; however, do not exert yourself strongly or do any heavy lifting tomorrow.  6. Call your physician for a follow-up appointment.    You may resume Plavix today.  You may resume Trazodone on Thursday, September 28, 2019 after 9:30a.m.

## 2019-09-27 NOTE — Progress Notes (Signed)
Pt reports he has been off of his Plavix for 5 days and has been off of his Trazodone for at least 48 hours.

## 2019-10-04 ENCOUNTER — Other Ambulatory Visit (HOSPITAL_COMMUNITY): Payer: Self-pay | Admitting: Internal Medicine

## 2019-10-05 ENCOUNTER — Ambulatory Visit (INDEPENDENT_AMBULATORY_CARE_PROVIDER_SITE_OTHER): Payer: BC Managed Care – PPO | Admitting: *Deleted

## 2019-10-05 DIAGNOSIS — M4716 Other spondylosis with myelopathy, lumbar region: Secondary | ICD-10-CM | POA: Diagnosis not present

## 2019-10-05 DIAGNOSIS — I5042 Chronic combined systolic (congestive) and diastolic (congestive) heart failure: Secondary | ICD-10-CM

## 2019-10-05 DIAGNOSIS — M48062 Spinal stenosis, lumbar region with neurogenic claudication: Secondary | ICD-10-CM | POA: Diagnosis not present

## 2019-10-05 DIAGNOSIS — M4316 Spondylolisthesis, lumbar region: Secondary | ICD-10-CM | POA: Diagnosis not present

## 2019-10-05 LAB — CUP PACEART REMOTE DEVICE CHECK
Battery Remaining Longevity: 20 mo
Battery Remaining Percentage: 19 %
Battery Voltage: 2.77 V
Brady Statistic AP VP Percent: 1 %
Brady Statistic AP VS Percent: 1 %
Brady Statistic AS VP Percent: 1 %
Brady Statistic AS VS Percent: 99 %
Brady Statistic RA Percent Paced: 1 %
Brady Statistic RV Percent Paced: 1 %
Date Time Interrogation Session: 20210325035612
HighPow Impedance: 63 Ohm
HighPow Impedance: 63 Ohm
Implantable Lead Implant Date: 20120201
Implantable Lead Implant Date: 20120201
Implantable Lead Location: 753859
Implantable Lead Location: 753860
Implantable Pulse Generator Implant Date: 20120201
Lead Channel Impedance Value: 340 Ohm
Lead Channel Impedance Value: 400 Ohm
Lead Channel Pacing Threshold Amplitude: 0.75 V
Lead Channel Pacing Threshold Amplitude: 1.75 V
Lead Channel Pacing Threshold Pulse Width: 0.5 ms
Lead Channel Pacing Threshold Pulse Width: 0.5 ms
Lead Channel Sensing Intrinsic Amplitude: 12 mV
Lead Channel Sensing Intrinsic Amplitude: 2.9 mV
Lead Channel Setting Pacing Amplitude: 2 V
Lead Channel Setting Pacing Amplitude: 3 V
Lead Channel Setting Pacing Pulse Width: 0.5 ms
Lead Channel Setting Sensing Sensitivity: 0.5 mV
Pulse Gen Serial Number: 815096

## 2019-10-06 NOTE — Progress Notes (Signed)
ICD Remote  

## 2019-10-27 ENCOUNTER — Other Ambulatory Visit: Payer: Self-pay | Admitting: Family Medicine

## 2019-10-27 NOTE — Telephone Encounter (Signed)
Requested medication (s) are due for refill today:  Yes  Requested medication (s) are on the active medication list:  Yes  Future visit scheduled:  No  Last Refill: 08/11/2019 #90; RF x 1  Notes to clinic: medication is not delegated  Requested Prescriptions  Pending Prescriptions Disp Refills   methocarbamol (ROBAXIN) 750 MG tablet [Pharmacy Med Name: METHOCARBAMOL 750 MG TABLET] 180 tablet 0    Sig: TAKE 1 OR 2 TABLETS THREE TIMES A DAY AS NEEDED.      Not Delegated - Analgesics:  Muscle Relaxants Failed - 10/27/2019  2:35 PM      Failed - This refill cannot be delegated      Passed - Valid encounter within last 6 months    Recent Outpatient Visits           2 months ago Left hip pain   [facility], [name], MD   4 months ago    [facility], [name], MD   5 months ago Left hip pain   [facility], [name], MD   7 months ago Hordeolum externum of right upper eyelid   [facility], [name], MD   9 months ago Chest congestion   [facility] at [facility], Ines Bloomer, MD

## 2019-10-27 NOTE — Telephone Encounter (Signed)
Requested medication (s) are due for refill today: Sildenafil, yes  Requested medication (s) are on the active medication list: yes  Last refill:  08/11/19  Future visit scheduled: no  Notes to clinic:  historical provider  Requested medication (s) are due for refill today: Methocarbamol, yes  {Requested medication (s) are on the active medication list: yes  Last refill:  08/11/19  Future visit scheduled: no  Notes to clinic:  not delegated    Requested Prescriptions  Pending Prescriptions Disp Refills   sildenafil (REVATIO) 20 MG tablet 10 tablet     Sig: Take 5 tablets (100 mg total) by mouth as needed.      Urology: Erectile Dysfunction Agents Passed - 10/27/2019  3:01 PM      Passed - Last BP in normal range    BP Readings from Last 1 Encounters:  09/27/19 137/66          Passed - Valid encounter within last 12 months    Recent Outpatient Visits           2 months ago Left hip pain   [facility], [name], MD   4 months ago    [facility], [name], MD   5 months ago Left hip pain   [facility], [name], MD   7 months ago Hordeolum externum of right upper eyelid   [facility], [name], MD   9 months ago Chest congestion   [facility] at [facility], [place], MD                methocarbamol (ROBAXIN) 500 MG tablet 90 tablet 1    Sig: Take 1.5 tablets (750 mg total) by mouth every 8 (eight) hours as needed for muscle spasms.      Not Delegated - Analgesics:  Muscle Relaxants Failed - 10/27/2019  3:01 PM      Failed - This refill cannot be delegated      Passed - Valid encounter within last 6 months    Recent Outpatient Visits           2 months ago Left hip pain   [facility], [name], MD   4 months ago    [facility], [name], MD   5 months ago Left hip pain   [facility], [name], MD   7 months ago  Hordeolum externum of right upper eyelid   [facility], [name], MD   9 months ago Chest congestion   [facility] at [facility], Ines Bloomer, MD

## 2019-10-27 NOTE — Telephone Encounter (Signed)
Copied from [place] [id number]. Topic: Quick Communication - Rx Refill/Question >> Oct 27, 2019  2:53 PM Izola Price, [place] A wrote: Medication: methocarbamol (ROBAXIN) 500 MG tablet ,sildenafil (REVATIO) 20 MG tablet (Patient would like medication request expedited.)  Has the patient contacted their pharmacy? {Yes (Agent: If no, request that the patient contact the pharmacy for the refill.) (Agent: If yes, when and what did the pharmacy advise?)Contact PCP  Preferred Pharmacy (with phone number or street name): [place], [place]  Phone:  [phone] Fax:  [phone]     Agent: Please be advised that RX refills may take up to 3 business days. We ask that you follow-up with your pharmacy.

## 2019-10-27 NOTE — Telephone Encounter (Signed)
Requested medication (s) are due for refill today -yes  Requested medication (s) are on the active medication list -yes  Future visit scheduled - no  Last refill: 10/08/19  Notes to clinic: Request by Rx by hisotrical provider  Requested Prescriptions  Pending Prescriptions Disp Refills   sildenafil (REVATIO) 20 MG tablet [Pharmacy Med Name: SILDENAFIL 20 MG TABLET] 90 tablet 0    Sig: TAKE 1 TABLET BY MOUTH 3 TIMES A DAY.      Urology: Erectile Dysfunction Agents Passed - 10/27/2019  2:29 PM      Passed - Last BP in normal range    BP Readings from Last 1 Encounters:  09/27/19 137/66          Passed - Valid encounter within last 12 months    Recent Outpatient Visits           2 months ago Left hip pain   [facility], [name], MD   4 months ago    [facility], [name], MD   5 months ago Left hip pain   [facility], [name], MD   7 months ago Hordeolum externum of right upper eyelid   [facility], [name], MD   9 months ago Chest congestion   [facility] at [facility], Ines Bloomer, MD                  Requested Prescriptions  Pending Prescriptions Disp Refills   sildenafil (REVATIO) 20 MG tablet [Pharmacy Med Name: SILDENAFIL 20 MG TABLET] 90 tablet 0    Sig: TAKE 1 TABLET BY MOUTH 3 TIMES A DAY.      Urology: Erectile Dysfunction Agents Passed - 10/27/2019  2:29 PM      Passed - Last BP in normal range    BP Readings from Last 1 Encounters:  09/27/19 137/66          Passed - Valid encounter within last 12 months    Recent Outpatient Visits           2 months ago Left hip pain   [facility], [name], MD   4 months ago    [facility], [name], MD   5 months ago Left hip pain   [facility], [name], MD   7 months ago Hordeolum externum of right upper eyelid   [facility], [name], MD   9 months  ago Chest congestion   [facility] at [facility], Ines Bloomer, MD

## 2019-10-30 ENCOUNTER — Other Ambulatory Visit: Payer: Self-pay | Admitting: Family Medicine

## 2019-10-30 MED ORDER — SILDENAFIL CITRATE 20 MG PO TABS: 100.0000 mg | ORAL_TABLET | ORAL | 5 refills | Status: DC | PRN

## 2019-11-02 ENCOUNTER — Telehealth: Payer: Self-pay | Admitting: Family Medicine

## 2019-11-02 NOTE — Telephone Encounter (Signed)
Pt called his said the state haves  Current Outpatient Medications on File Prior to Visit  Medication Sig Dispense Refill  . albuterol (VENTOLIN HFA) 108 (90 Base) MCG/ACT inhaler Inhale 2 puffs into the lungs every 6 (six) hours as needed for wheezing or shortness of breath. 18 g 5  . ALPRAZolam (XANAX) 1 MG tablet Take 1 mg by mouth 4 (four) times daily.     . carvedilol (COREG) 25 MG tablet Take 25 mg by mouth 2 (two) times daily with a meal.    . clonazePAM (KLONOPIN) 1 MG tablet Take 1 mg by mouth 3 (three) times daily as needed.    . clopidogrel (PLAVIX) 75 MG tablet Take 1 tablet (75 mg total) by mouth daily. Pt needs to schedule an appt with our office at [phone] for future refills. 30 tablet 2  . cyanocobalamin (,VITAMIN B-12,) 1000 MCG/ML injection Inject 1 mL (1,000 mcg total) into the muscle every 30 (thirty) days. 1 mL 11  . dapagliflozin propanediol (FARXIGA) 10 MG TABS tablet Take 10 mg by mouth daily before breakfast. 30 tablet 6  . diazepam (VALIUM) 10 MG tablet Take 20 mg by mouth every 12 (twelve) hours as needed for anxiety.   2  . erythromycin ophthalmic ointment Place 1 application into the right eye at bedtime. 3.5 g 1  . ezetimibe (ZETIA) 10 MG tablet TAKE 1 TABLET EACH DAY. 90 tablet 1  . ibuprofen (ADVIL,MOTRIN) 200 MG tablet Take 800 mg by mouth every 6 (six) hours as needed for moderate pain.    Marland Kitchen lactulose (CHRONULAC) 10 GM/15ML solution Take 15 mLs (10 g total) by mouth 2 (two) times daily as needed for mild constipation. 1892 mL 0  . lisinopril (PRINIVIL,ZESTRIL) 5 MG tablet Take 5 mg by mouth daily.     Marland Kitchen loperamide (IMODIUM A-D) 2 MG tablet Take 3 mg by mouth 4 (four) times daily as needed for diarrhea or loose stools.    Marland Kitchen losartan (COZAAR) 25 MG tablet Take 1 tablet (25 mg total) by mouth daily. 90 tablet 3  . methadone (METHADOSE) 40 MG disintegrating tablet Take 200 mg by mouth daily.    . methocarbamol (ROBAXIN) 500 MG tablet Take 1.5 tablets (750 mg  total) by mouth every 8 (eight) hours as needed for muscle spasms. 90 tablet 1  . methocarbamol (ROBAXIN) 750 MG tablet TAKE 1 OR 2 TABLETS THREE TIMES A DAY AS NEEDED. 180 tablet 5  . naproxen (NAPROSYN) 500 MG tablet Take 1 tablet (500 mg total) by mouth 2 (two) times daily as needed for moderate pain. 60 tablet 2  . nitroGLYCERIN (NITROLINGUAL) 0.4 MG/SPRAY spray Place 2 sprays under the tongue every 5 (five) minutes x 3 doses as needed for chest pain.     Marland Kitchen ondansetron (ZOFRAN-ODT) 8 MG disintegrating tablet Take 1 tablet (8 mg total) by mouth every 8 (eight) hours as needed for nausea or vomiting. 90 tablet 1  . oxymetazoline (AFRIN) 0.05 % nasal spray Place 1 spray into both nostrils 2 (two) times daily.    . pantoprazole (PROTONIX) 40 MG tablet Take 1 tablet (40 mg total) by mouth daily. 90 tablet 1  . polyethylene glycol powder (MIRALAX) 17 GM/SCOOP powder Take 17 g by mouth 2 (two) times daily as needed for mild constipation.    . rosuvastatin (CRESTOR) 40 MG tablet Take 1 tablet (40 mg total) by mouth every evening. 30 tablet 2  . sildenafil (REVATIO) 20 MG tablet Take 5 tablets (100 mg  total) by mouth as needed. 10 tablet 5  . spironolactone (ALDACTONE) 25 MG tablet TAKE (1/2) TABLET DAILY. 45 tablet 1  . sucralfate (CARAFATE) 1 g tablet Take 1 tablet (1 g total) by mouth 4 (four) times daily -  with meals and at bedtime. 120 tablet 0  . tamsulosin (FLOMAX) 0.4 MG CAPS capsule Take 1 capsule (0.4 mg total) by mouth daily after breakfast. 90 capsule 0  . TESTOSTERONE CYPIONATE IM Inject into the muscle every 14 (fourteen) days.    Marland Kitchen torsemide (DEMADEX) 20 MG tablet Take 1 tablet (20 mg total) by mouth daily as needed. For swelling in legs 90 tablet 2  . traZODone (DESYREL) 100 MG tablet Take 100 mg by mouth at bedtime.     . triamcinolone (NASACORT AQ) 55 MCG/ACT AERO nasal inhaler Place 1 spray into the nose daily. 1 Inhaler 12  . zolpidem (AMBIEN) 10 MG tablet Take 10 mg by mouth at  bedtime.      No current facility-administered medications on file prior to visit.

## 2019-11-07 DIAGNOSIS — F4322 Adjustment disorder with anxiety: Secondary | ICD-10-CM | POA: Diagnosis not present

## 2019-11-08 ENCOUNTER — Telehealth: Payer: Self-pay

## 2019-11-08 NOTE — Telephone Encounter (Signed)
Received in basket message stating that patient called requesting an appointment. Per Dr Alen Blew patient he needs to see his PCP or cardiologist before being referred here. He has not been seen here for over 2 years. Called patient and let him know that he would need to make an appointment to see his PCP or cardiologist first befroe being referred back to the cancer center. He verbalized understanding.

## 2019-11-10 ENCOUNTER — Other Ambulatory Visit: Payer: Self-pay

## 2019-11-10 ENCOUNTER — Telehealth (INDEPENDENT_AMBULATORY_CARE_PROVIDER_SITE_OTHER): Payer: BC Managed Care – PPO | Admitting: Family Medicine

## 2019-11-10 DIAGNOSIS — E538 Deficiency of other specified B group vitamins: Secondary | ICD-10-CM | POA: Diagnosis not present

## 2019-11-10 DIAGNOSIS — M6281 Muscle weakness (generalized): Secondary | ICD-10-CM | POA: Diagnosis not present

## 2019-11-10 DIAGNOSIS — D751 Secondary polycythemia: Secondary | ICD-10-CM | POA: Diagnosis not present

## 2019-11-10 DIAGNOSIS — E785 Hyperlipidemia, unspecified: Secondary | ICD-10-CM

## 2019-11-10 DIAGNOSIS — Z1211 Encounter for screening for malignant neoplasm of colon: Secondary | ICD-10-CM

## 2019-11-10 NOTE — Progress Notes (Signed)
Virtual Visit Note  I connected with patient on 11/10/19 at 521pm by video epic and verified that I am speaking with the correct person using two identifiers. [name] is currently located at home and patient is currently with them during visit. The provider, [name], MD is located in their office at time of visit.  I discussed the limitations, risks, security and privacy concerns of performing an evaluation and management service by telephone and the availability of in person appointments. I also discussed with the patient that there may be a patient responsible charge related to this service. The patient expressed understanding and agreed to proceed.   I provided 25 minutes of non-face-to-face time during this encounter.  Chief Complaint  Patient presents with  . Referral    Dr Alen Blew oncology - last seen 2 yrs ago     HPI ? Patient concerned about continued decline in function and independence He reports continue loss of muscle mass Now needs 2 care givers, placing ramps Concerned that polycythemia has comed back Patient is requesting referral back to hematologist Patient requested an appt with Dr Alen Blew and was told he needed to see his PCP or cardiologist first as last OV was over 2 years ago  Most recent eval from spine surgeon - not a surgical candidate  Last cards OV with Dr Haroldine Laws Feb 2021  EF 40-45%, improved S/p ICD NYHA III No cath at this time as no again [facility] to stop plavix for surgeries  Myelogram done thru [facility] Dr Patrice Paradise Wrote letters requesting monthly methadone home take  Uses cpap every night Uses 5L oxygen with cpap   He is still on testosterone  H/o testicular cancer in early [facility]s at [facility] Having pain but no induration, pain is from recent He continues to see urologist   Needs referral to new GI as got dismissed from [facility] by Dr Loletha Carrow as he missed his colonoscopy due to bad weather    Allergies  Allergen  Reactions  . Darvocet [Propoxyphene N-Acetaminophen] Anaphylaxis    Throat closes    Prior to Admission medications   Medication Sig Start Date End Date Taking? Authorizing Provider  albuterol (VENTOLIN HFA) 108 (90 Base) MCG/ACT inhaler Inhale 2 puffs into the lungs every 6 (six) hours as needed for wheezing or shortness of breath. 03/03/19  Yes [name], MD  ALPRAZolam Duanne Moron) 1 MG tablet Take 1 mg by mouth 4 (four) times daily.    Yes [provider]  carvedilol (COREG) 25 MG tablet Take 25 mg by mouth 2 (two) times daily with a meal.   Yes [provider]  clonazePAM (KLONOPIN) 1 MG tablet Take 1 mg by mouth 3 (three) times daily as needed. 06/16/19  Yes [provider]  clopidogrel (PLAVIX) 75 MG tablet Take 1 tablet (75 mg total) by mouth daily. Pt needs to schedule an appt with our office at [phone] for future refills. 04/12/18  Yes Bensimhon, Shaune Pascal, MD  cyanocobalamin (,VITAMIN B-12,) 1000 MCG/ML injection Inject 1 mL (1,000 mcg total) into the muscle every 30 (thirty) days. 05/25/19  Yes [name], MD  dapagliflozin propanediol (FARXIGA) 10 MG TABS tablet Take 10 mg by mouth daily before breakfast. 06/21/19  Yes Bensimhon, Shaune Pascal, MD  diazepam (VALIUM) 10 MG tablet Take 20 mg by mouth every 12 (twelve) hours as needed for anxiety.  06/04/17  Yes [provider]  erythromycin ophthalmic ointment Place 1 application into the right  eye at bedtime. 03/03/19  Yes [name], MD  ezetimibe (ZETIA) 10 MG tablet TAKE 1 TABLET EACH DAY. 10/04/19  Yes Bensimhon, Shaune Pascal, MD  ibuprofen (ADVIL,MOTRIN) 200 MG tablet Take 800 mg by mouth every 6 (six) hours as needed for moderate pain.   Yes [provider]  lactulose (CHRONULAC) 10 GM/15ML solution Take 15 mLs (10 g total) by mouth 2 (two) times daily as needed for mild constipation. 08/05/18  Yes Sagardia, Ines Bloomer, MD  lisinopril (PRINIVIL,ZESTRIL) 5 MG tablet Take 5 mg by  mouth daily.  07/20/18  Yes [provider]  loperamide (IMODIUM A-D) 2 MG tablet Take 3 mg by mouth 4 (four) times daily as needed for diarrhea or loose stools.   Yes [provider]  losartan (COZAAR) 25 MG tablet Take 1 tablet (25 mg total) by mouth daily. 06/07/19  Yes Bensimhon, Shaune Pascal, MD  methadone (METHADOSE) 40 MG disintegrating tablet Take 200 mg by mouth daily.   Yes [provider]  methocarbamol (ROBAXIN) 500 MG tablet Take 1.5 tablets (750 mg total) by mouth every 8 (eight) hours as needed for muscle spasms. 08/11/19  Yes [name], MD  methocarbamol (ROBAXIN) 750 MG tablet TAKE 1 OR 2 TABLETS THREE TIMES A DAY AS NEEDED. 10/30/19  Yes [name], MD  naproxen (NAPROSYN) 500 MG tablet Take 1 tablet (500 mg total) by mouth 2 (two) times daily as needed for moderate pain. 05/25/19  Yes [name], MD  nitroGLYCERIN (NITROLINGUAL) 0.4 MG/SPRAY spray Place 2 sprays under the tongue every 5 (five) minutes x 3 doses as needed for chest pain.  03/20/15  Yes [provider]  ondansetron (ZOFRAN-ODT) 8 MG disintegrating tablet Take 1 tablet (8 mg total) by mouth every 8 (eight) hours as needed for nausea or vomiting. 03/03/19  Yes [name], MD  oxymetazoline (AFRIN) 0.05 % nasal spray Place 1 spray into both nostrils 2 (two) times daily.   Yes [provider]  pantoprazole (PROTONIX) 40 MG tablet Take 1 tablet (40 mg total) by mouth daily. 05/25/19  Yes [name], MD  polyethylene glycol powder (MIRALAX) 17 GM/SCOOP powder Take 17 g by mouth 2 (two) times daily as needed for mild constipation.   Yes [provider]  rosuvastatin (CRESTOR) 40 MG tablet Take 1 tablet (40 mg total) by mouth every evening. 04/12/18  Yes Bensimhon, Shaune Pascal, MD  sildenafil (REVATIO) 20 MG tablet Take 5 tablets (100 mg total) by mouth as needed. 10/30/19  Yes [name], MD  spironolactone (ALDACTONE) 25 MG tablet TAKE (1/2) TABLET  DAILY. 10/04/19  Yes Bensimhon, Shaune Pascal, MD  sucralfate (CARAFATE) 1 g tablet Take 1 tablet (1 g total) by mouth 4 (four) times daily -  with meals and at bedtime. 03/14/18  Yes Bloomfield, Carley D, DO  tamsulosin (FLOMAX) 0.4 MG CAPS capsule Take 1 capsule (0.4 mg total) by mouth daily after breakfast. 11/22/17  Yes Wardell Honour, MD  TESTOSTERONE CYPIONATE IM Inject into the muscle every 14 (fourteen) days.   Yes [provider]  torsemide (DEMADEX) 20 MG tablet Take 1 tablet (20 mg total) by mouth daily as needed. For swelling in legs 10/07/15  Yes Bensimhon, Shaune Pascal, MD  traZODone (DESYREL) 100 MG tablet Take 100 mg by mouth at bedtime.    Yes [provider]  triamcinolone (NASACORT AQ) 55 MCG/ACT AERO nasal inhaler Place 1 spray into the nose daily. 04/24/16  Yes  Chesley Mires, MD  zolpidem (AMBIEN) 10 MG tablet Take 10 mg by mouth at bedtime.  07/11/18  Yes [provider]    Past Medical History:  Diagnosis Date  . Anxiety   . Automatic implantable cardiac defibrillator [employer]    Analyze ST  . Benign prostatic hypertrophy   . Benzodiazepine dependence (HCC)    chronic  . CAD (coronary artery disease)    Last cath 2/12. 3-v CAD. Failed PCI of distal RCAc  CATH [facility] 4/13 with DES to  LAD X2  . CHF (congestive heart failure) ([place])   . Chronic back pain    lumbar stenosis  . Chronic systolic heart failure (HCC)    EF 20-25%. s/p ST. Jude ICD  . Depression   . DJD (degenerative joint disease)   . History of testicular cancer   . Ischemic cardiomyopathy   . Myocardial infarction ([place])   . Narcotic dependence (HCC)    chronic  . OSA on CPAP   . Polycythemia, secondary    S/p hematology consultation for polycythema on 01/06/18.  S/p bone marrow biopsy on 12/31/17 that was negative for myeloproliferative disorder.  Feels secondary to sleep apnea, active smoking, testosterone supplementation, nocturnal hypoxia  . TIA (transient ischemic attack)   . Vitamin  B12 deficiency 07/22/2016    Past Surgical History:  Procedure Laterality Date  . ASD REPAIR, SINUS VENOSUS    . CARDIAC DEFIBRILLATOR PLACEMENT  08/2010  . HERNIA REPAIR    . LEFT HEART CATHETERIZATION WITH CORONARY ANGIOGRAM N/A 06/14/2014   Procedure: LEFT HEART CATHETERIZATION WITH CORONARY ANGIOGRAM;  Surgeon: Jolaine Artist, MD;  Location: [facility] CATH LAB;  Service: Cardiovascular;  Laterality: N/A;  . TESTICLE SURGERY     testicular cancer surgery left testicle removed    Social History   Tobacco Use  . Smoking status: Current Every Day Smoker    Packs/day: 0.50    Years: 29.00    Pack years: 14.50    Types: Cigarettes, E-cigarettes    Last attempt to quit: 07/14/2015    Years since quitting: 4.3  . Smokeless tobacco: Never Used  . Tobacco comment: no cigarette in 4 days but yes to e-cig  Substance Use Topics  . Alcohol use: No    Alcohol/week: 0.0 standard drinks    Family History  Problem Relation Age of Onset  . Heart disease Father 50       Died of MI  . Cancer Mother     ROS Per hpi  Objective  Vitals as reported by the patient: none  GEN: AAOx3, NAD HEENT: /AT, pupils are symmetrical, EOMI, non-icteric sclera Resp: breathing comfortably, speaking in flat tone Skin: no rashes noted, no pallor Psych: good eye contact   ASSESSMENT and PLAN  1. Muscle weakness (generalized) Labs pending. If polycythemia present will refer to heme as requested. Consider neuro referral. Continue with supportive measures and increased care at home.  - TSH; Future - Magnesium; Future - VITAMIN D 25 Hydroxy (Vit-D Deficiency, Fractures); Future  2. Polycythemia, secondary - CBC; Future  3. Vitamin B12 deficiency - Vitamin B12; Future  4. Hyperlipidemia LDL goal <70 - Comprehensive metabolic panel; Future - Lipid panel; Future  5. Colon cancer screening - Ambulatory referral to Gastroenterology  FOLLOW-UP: 4 weeks, in office   The above assessment and  management plan was discussed with the patient. The patient verbalized understanding of and has agreed to the management plan. Patient is aware to call the clinic if  symptoms persist or worsen. Patient is aware when to return to the clinic for a follow-up visit. Patient educated on when it is appropriate to go to the emergency department.     [name], MD Primary Care at [place],  [address] Ph.  [phone] Fax [phone]

## 2019-11-10 NOTE — Patient Instructions (Signed)
° ° ° °  If you have lab work done today you will be contacted with your lab results within the next 2 weeks.  If you have not heard from us then please contact us. The fastest way to get your results is to register for My Chart. ° ° °IF you received an x-ray today, you will receive an invoice from [facility]. Please contact [facility] at [phone] with questions or concerns regarding your invoice.  ° °IF you received labwork today, you will receive an invoice from [facility]. Please contact [facility] at [phone] with questions or concerns regarding your invoice.  ° °Our billing staff will not be able to assist you with questions regarding bills from these companies. ° °You will be contacted with the lab results as soon as they are available. The fastest way to get your results is to activate your My Chart account. Instructions are located on the last page of this paperwork. If you have not heard from us regarding the results in 2 weeks, please contact this office. °  ° ° ° °

## 2019-11-13 ENCOUNTER — Telehealth: Payer: Self-pay

## 2019-11-13 NOTE — Telephone Encounter (Signed)
Called Dr Towanda Malkin office to request medical records from [facility] / Dr Patrice Paradise per drs request.  LVM

## 2019-11-15 DIAGNOSIS — G4733 Obstructive sleep apnea (adult) (pediatric): Secondary | ICD-10-CM | POA: Diagnosis not present

## 2019-11-16 ENCOUNTER — Encounter: Payer: Self-pay | Admitting: Family Medicine

## 2019-11-20 ENCOUNTER — Telehealth: Payer: Self-pay | Admitting: Family Medicine

## 2019-11-20 NOTE — Telephone Encounter (Signed)
Please clarify which medications as I do not remember refills requested. Sorry and thanks.

## 2019-11-20 NOTE — Telephone Encounter (Signed)
Pt called and stated that his medication was not sent into his pharmacy from his visit on 11/10/19. Please advise.

## 2019-11-20 NOTE — Telephone Encounter (Signed)
Patient states that his medication was not sent to the pharmacy for his 11/10/2019 visit. Please Advise .

## 2019-11-21 ENCOUNTER — Ambulatory Visit (INDEPENDENT_AMBULATORY_CARE_PROVIDER_SITE_OTHER): Payer: BC Managed Care – PPO | Admitting: Family Medicine

## 2019-11-21 ENCOUNTER — Other Ambulatory Visit: Payer: Self-pay

## 2019-11-21 ENCOUNTER — Telehealth: Payer: Self-pay

## 2019-11-21 DIAGNOSIS — E785 Hyperlipidemia, unspecified: Secondary | ICD-10-CM | POA: Diagnosis not present

## 2019-11-21 DIAGNOSIS — D751 Secondary polycythemia: Secondary | ICD-10-CM | POA: Diagnosis not present

## 2019-11-21 DIAGNOSIS — M6281 Muscle weakness (generalized): Secondary | ICD-10-CM

## 2019-11-21 DIAGNOSIS — E538 Deficiency of other specified B group vitamins: Secondary | ICD-10-CM

## 2019-11-21 DIAGNOSIS — R110 Nausea: Secondary | ICD-10-CM

## 2019-11-21 MED ORDER — ALBUTEROL SULFATE HFA 108 (90 BASE) MCG/ACT IN AERS: 2.0000 | INHALATION_SPRAY | Freq: Four times a day (QID) | RESPIRATORY_TRACT | 5 refills | Status: DC | PRN

## 2019-11-21 MED ORDER — SILDENAFIL CITRATE 20 MG PO TABS: 100.0000 mg | ORAL_TABLET | ORAL | 5 refills | Status: DC | PRN

## 2019-11-21 MED ORDER — LOPERAMIDE HCL 2 MG PO TABS: 3.0000 mg | ORAL_TABLET | Freq: Four times a day (QID) | ORAL | 2 refills | Status: DC | PRN

## 2019-11-21 MED ORDER — TAMSULOSIN HCL 0.4 MG PO CAPS: 0.4000 mg | ORAL_CAPSULE | Freq: Every day | ORAL | 0 refills | Status: DC

## 2019-11-21 MED ORDER — ONDANSETRON 8 MG PO TBDP: 8.0000 mg | ORAL_TABLET | Freq: Three times a day (TID) | ORAL | 1 refills | Status: DC | PRN

## 2019-11-21 NOTE — Telephone Encounter (Signed)
Pt was in office for blood work today, requested a few refills and also requested prescription strength imodium that he states you were supposed to send in for him following his last visit (telemed 11/10/2019)

## 2019-11-21 NOTE — Telephone Encounter (Signed)
Patient states he didn't remember the name of the medication when I called him he stated he will just let us know when he come to the office for his nurse visit.

## 2019-11-21 NOTE — Progress Notes (Signed)
Labs only visit

## 2019-11-22 LAB — COMPREHENSIVE METABOLIC PANEL
ALT: 9 IU/L (ref 0–44)
AST: 14 IU/L (ref 0–40)
Albumin/Globulin Ratio: 1.9 (ref 1.2–2.2)
Albumin: 4.7 g/dL (ref 3.8–4.9)
Alkaline Phosphatase: 118 IU/L — ABNORMAL HIGH (ref 39–117)
BUN/Creatinine Ratio: 15 (ref 10–24)
BUN: 14 mg/dL (ref 8–27)
Bilirubin Total: 0.3 mg/dL (ref 0.0–1.2)
CO2: 26 mmol/L (ref 20–29)
Calcium: 9.5 mg/dL (ref 8.6–10.2)
Chloride: 86 mmol/L — ABNORMAL LOW (ref 96–106)
Creatinine, Ser: 0.94 mg/dL (ref 0.76–1.27)
GFR calc Af Amer: 101 mL/min/{1.73_m2} (ref >59)
GFR calc non Af Amer: 88 mL/min/{1.73_m2} (ref >59)
Globulin, Total: 2.5 g/dL (ref 1.5–4.5)
Glucose: 82 mg/dL (ref 65–99)
Potassium: 4 mmol/L (ref 3.5–5.2)
Sodium: 124 mmol/L — ABNORMAL LOW (ref 134–144)
Total Protein: 7.2 g/dL (ref 6.0–8.5)

## 2019-11-22 LAB — LIPID PANEL
Chol/HDL Ratio: 5.4 ratio — ABNORMAL HIGH (ref 0.0–5.0)
Cholesterol, Total: 216 mg/dL — ABNORMAL HIGH (ref 100–199)
HDL: 40 mg/dL (ref >39)
LDL Chol Calc (NIH): 157 mg/dL — ABNORMAL HIGH (ref 0–99)
Triglycerides: 106 mg/dL (ref 0–149)
VLDL Cholesterol Cal: 19 mg/dL (ref 5–40)

## 2019-11-22 LAB — CBC
Hematocrit: 50.4 % (ref 37.5–51.0)
Hemoglobin: 17.7 g/dL (ref 13.0–17.7)
MCH: 32.9 pg (ref 26.6–33.0)
MCHC: 35.1 g/dL (ref 31.5–35.7)
MCV: 94 fL (ref 79–97)
Platelets: 143 10*3/uL — ABNORMAL LOW (ref 150–450)
RBC: 5.38 x10E6/uL (ref 4.14–5.80)
RDW: 13.9 % (ref 11.6–15.4)
WBC: 4.5 10*3/uL (ref 3.4–10.8)

## 2019-11-22 LAB — VITAMIN B12: Vitamin B-12: 325 pg/mL (ref 232–1245)

## 2019-11-22 LAB — TSH: TSH: 0.929 u[IU]/mL (ref 0.450–4.500)

## 2019-11-22 LAB — VITAMIN D 25 HYDROXY (VIT D DEFICIENCY, FRACTURES): Vit D, 25-Hydroxy: 8.2 ng/mL — ABNORMAL LOW (ref 30.0–100.0)

## 2019-11-22 LAB — MAGNESIUM: Magnesium: 1.9 mg/dL (ref 1.6–2.3)

## 2019-11-28 ENCOUNTER — Other Ambulatory Visit: Payer: Self-pay

## 2019-11-28 ENCOUNTER — Telehealth: Payer: Self-pay

## 2019-11-28 DIAGNOSIS — N529 Male erectile dysfunction, unspecified: Secondary | ICD-10-CM

## 2019-11-28 MED ORDER — SILDENAFIL CITRATE 20 MG PO TABS: 100.0000 mg | ORAL_TABLET | ORAL | 5 refills | Status: DC | PRN

## 2019-11-28 NOTE — Telephone Encounter (Signed)
Per [place], ok to send sildenafil 90 tabs with 5 refills

## 2019-11-29 ENCOUNTER — Telehealth: Payer: Self-pay

## 2019-11-29 ENCOUNTER — Telehealth: Payer: Self-pay | Admitting: Oncology

## 2019-11-29 NOTE — Telephone Encounter (Signed)
-----   Message from Wyatt Portela, MD sent at 11/29/2019 11:46 AM EDT ----- I am happy to see him but let him know that his labs are normal and there is nothing wrong from a hematology standpoint.  If he still wants to have an appointment, please send a message to scheduling for nonurgent visit in the next few weeks. No labs. Thanks ----- Message ----- From: Tami Lin, RN Sent: 11/29/2019  11:39 AM EDT To: Wyatt Portela, MD  Patient was last seen here 2 years ago. Patient states when he called to get an appointment with you he was told that he needed to see his PCP or cardiologist first. Patient called to report he saw his PCP on 4/30 and 5/11. He wants to know if he can get an appointment to see you. He said labs were drawn on 5/11 and are in my chart as well as office note from PCP.  Lanelle Bal

## 2019-11-29 NOTE — Telephone Encounter (Signed)
Scheduled appt per [facility] staff message - pt is aware of appt .

## 2019-11-29 NOTE — Telephone Encounter (Signed)
Called patient and let him know Dr. Hazeline Junker message below. He verbalized understanding and stated he would still like to make an appointment to see Dr. Alen Blew. Scheduling message sent and patient is aware to expect a call from scheduling.

## 2019-12-04 ENCOUNTER — Other Ambulatory Visit: Payer: Self-pay | Admitting: Family Medicine

## 2019-12-04 MED ORDER — VITAMIN D (ERGOCALCIFEROL) 1.25 MG (50000 UNIT) PO CAPS
50000.0000 [IU] | ORAL_CAPSULE | ORAL | 1 refills | Status: DC
Start: 2019-12-04 — End: 2020-02-28

## 2019-12-07 ENCOUNTER — Ambulatory Visit (INDEPENDENT_AMBULATORY_CARE_PROVIDER_SITE_OTHER): Payer: BC Managed Care – PPO

## 2019-12-07 ENCOUNTER — Other Ambulatory Visit: Payer: Self-pay

## 2019-12-07 ENCOUNTER — Encounter: Payer: Self-pay | Admitting: Family Medicine

## 2019-12-07 ENCOUNTER — Ambulatory Visit (INDEPENDENT_AMBULATORY_CARE_PROVIDER_SITE_OTHER): Payer: BC Managed Care – PPO | Admitting: Family Medicine

## 2019-12-07 VITALS — BP 108/65 | HR 60 | Temp 98.0°F | Ht 72.0 in | Wt 205.0 lb

## 2019-12-07 DIAGNOSIS — M4316 Spondylolisthesis, lumbar region: Secondary | ICD-10-CM | POA: Diagnosis not present

## 2019-12-07 DIAGNOSIS — M25551 Pain in right hip: Secondary | ICD-10-CM | POA: Diagnosis not present

## 2019-12-07 DIAGNOSIS — S3992XA Unspecified injury of lower back, initial encounter: Secondary | ICD-10-CM | POA: Diagnosis not present

## 2019-12-07 DIAGNOSIS — M48062 Spinal stenosis, lumbar region with neurogenic claudication: Secondary | ICD-10-CM | POA: Diagnosis not present

## 2019-12-07 DIAGNOSIS — M533 Sacrococcygeal disorders, not elsewhere classified: Secondary | ICD-10-CM

## 2019-12-07 DIAGNOSIS — I509 Heart failure, unspecified: Secondary | ICD-10-CM | POA: Diagnosis not present

## 2019-12-07 DIAGNOSIS — S79911A Unspecified injury of right hip, initial encounter: Secondary | ICD-10-CM | POA: Diagnosis not present

## 2019-12-07 NOTE — Patient Instructions (Signed)
° ° ° °  If you have lab work done today you will be contacted with your lab results within the next 2 weeks.  If you have not heard from us then please contact us. The fastest way to get your results is to register for My Chart. ° ° °IF you received an x-ray today, you will receive an invoice from [facility]. Please contact [facility] at [phone] with questions or concerns regarding your invoice.  ° °IF you received labwork today, you will receive an invoice from [facility]. Please contact [facility] at [phone] with questions or concerns regarding your invoice.  ° °Our billing staff will not be able to assist you with questions regarding bills from these companies. ° °You will be contacted with the lab results as soon as they are available. The fastest way to get your results is to activate your My Chart account. Instructions are located on the last page of this paperwork. If you have not heard from us regarding the results in 2 weeks, please contact this office. °  ° ° ° °

## 2019-12-07 NOTE — Progress Notes (Signed)
5/27/20215:52 PM  [name] 04-May-1961, 59 y.o., male [id number]  Chief Complaint  Patient presents with  . Tremors  . Pain    pain in the right leg, says he fell  . Medication Refill    zofran    HPI:    Patient is a 59 y.o. male with past medical history significant for CAD with cardiomyopathy, ICD in place, PAD, complex sleep apnea on bipap with 5L, testicular cancer,lumbar stenosis, narcotic and benzo dependence, COPD, chronic nausea, TB MAC,secondary polycythemia, c diffwho presents today for several concerns  Patient having increased coccyx and right hip pain since recent fall, he reports groin numbness and at times decreased sensation associated with BM He sees spine next week  He has been very stressed this past month Divorce and custody finally finalizing exwife will be moving out the house Anxiety causes him to become nauseous and he has been taking more zofran than prescribed  He does need to reschedule appt with GI He has upcoming appt with cards, last echo jan 2021 with slight decrease in EF, currently 40-45%  He started taking vitamin D as prescribed  Patient with normal CBC and CMP 2 weeks ago  He is here with caregiver who helps provide history as patient with very soft at times difficult to understand speech  Depression screen [facility] 2/9 11/10/2019 05/25/2019 03/03/2019  Decreased Interest 0 0 0  Down, Depressed, Hopeless 0 0 0  PHQ - 2 Score 0 0 0  Altered sleeping - - -  Tired, decreased energy - - -  Change in appetite - - -  Feeling bad or failure about yourself  - - -  Trouble concentrating - - -  Moving slowly or fidgety/restless - - -  Suicidal thoughts - - -  PHQ-9 Score - - -  Difficult doing work/chores - - -  Some recent data might be hidden    Fall Risk  12/07/2019 11/10/2019 05/25/2019 03/03/2019 03/03/2019  Falls in the past year? 1 0 _0 Comment - - - - -  Number falls in past yr: 1 0 _1 Comment - - - - -  Injury with  Fall? 1 0 _2 Comment - - - - injuries - head, right knee, fx right hip  from the falls  Risk Factor Category  - - - - -  Risk for fall due to : - - Impaired balance/gait;Impaired mobility;Medication side effect;Mental status change;History of fall(s);Other (Comment) - -  Risk for fall due to: Comment - - - - -  Follow up - Falls evaluation completed - Falls evaluation completed -  Comment - - - - -     Allergies  Allergen Reactions  . Darvocet [Propoxyphene N-Acetaminophen] Anaphylaxis    Throat closes    Prior to Admission medications   Medication Sig Start Date End Date Taking? Authorizing Provider  albuterol (VENTOLIN HFA) 108 (90 Base) MCG/ACT inhaler Inhale 2 puffs into the lungs every 6 (six) hours as needed for wheezing or shortness of breath. 11/21/19  Yes [name], MD  ALPRAZolam Duanne Moron) 1 MG tablet Take 1 mg by mouth 4 (four) times daily.    Yes [provider]  carvedilol (COREG) 25 MG tablet Take 25 mg by mouth 2 (two) times daily with a meal.   Yes [provider]  clonazePAM (KLONOPIN) 1 MG tablet Take 1 mg by mouth 3 (three) times daily as needed. 06/16/19  Yes [provider]  clopidogrel (PLAVIX) 75 MG tablet Take 1 tablet (75 mg total) by mouth daily. Pt needs to schedule an appt with our office at [phone] for future refills. 04/12/18  Yes Bensimhon, Shaune Pascal, MD  cyanocobalamin (,VITAMIN B-12,) 1000 MCG/ML injection Inject 1 mL (1,000 mcg total) into the muscle every 30 (thirty) days. 05/25/19  Yes [name], MD  dapagliflozin propanediol (FARXIGA) 10 MG TABS tablet Take 10 mg by mouth daily before breakfast. 06/21/19  Yes Bensimhon, Shaune Pascal, MD  diazepam (VALIUM) 10 MG tablet Take 20 mg by mouth every 12 (twelve) hours as needed for anxiety.  06/04/17  Yes [provider]  erythromycin ophthalmic ointment Place 1 application into the right eye at bedtime. 03/03/19  Yes [name], MD  ezetimibe (ZETIA) 10 MG  tablet TAKE 1 TABLET EACH DAY. 10/04/19  Yes Bensimhon, Shaune Pascal, MD  ibuprofen (ADVIL,MOTRIN) 200 MG tablet Take 800 mg by mouth every 6 (six) hours as needed for moderate pain.   Yes [provider]  lactulose (CHRONULAC) 10 GM/15ML solution Take 15 mLs (10 g total) by mouth 2 (two) times daily as needed for mild constipation. 08/05/18  Yes Sagardia, Ines Bloomer, MD  lisinopril (PRINIVIL,ZESTRIL) 5 MG tablet Take 5 mg by mouth daily.  07/20/18  Yes [provider]  loperamide (IMODIUM A-D) 2 MG tablet Take 1.5 tablets (3 mg total) by mouth 4 (four) times daily as needed for diarrhea or loose stools. 11/21/19  Yes [name], MD  losartan (COZAAR) 25 MG tablet Take 1 tablet (25 mg total) by mouth daily. 06/07/19  Yes Bensimhon, Shaune Pascal, MD  methadone (METHADOSE) 40 MG disintegrating tablet Take 200 mg by mouth daily.   Yes [provider]  methocarbamol (ROBAXIN) 500 MG tablet Take 1.5 tablets (750 mg total) by mouth every 8 (eight) hours as needed for muscle spasms. 08/11/19  Yes [name], MD  methocarbamol (ROBAXIN) 750 MG tablet TAKE 1 OR 2 TABLETS THREE TIMES A DAY AS NEEDED. 10/30/19  Yes [name], MD  naproxen (NAPROSYN) 500 MG tablet Take 1 tablet (500 mg total) by mouth 2 (two) times daily as needed for moderate pain. 05/25/19  Yes [name], MD  nitroGLYCERIN (NITROLINGUAL) 0.4 MG/SPRAY spray Place 2 sprays under the tongue every 5 (five) minutes x 3 doses as needed for chest pain.  03/20/15  Yes [provider]  ondansetron (ZOFRAN-ODT) 8 MG disintegrating tablet Take 1 tablet (8 mg total) by mouth every 8 (eight) hours as needed for nausea or vomiting. 11/21/19  Yes [name], MD  oxymetazoline (AFRIN) 0.05 % nasal spray Place 1 spray into both nostrils 2 (two) times daily.   Yes [provider]  pantoprazole (PROTONIX) 40 MG tablet Take 1 tablet (40 mg total) by mouth daily. 05/25/19  Yes [name], MD    polyethylene glycol powder (MIRALAX) 17 GM/SCOOP powder Take 17 g by mouth 2 (two) times daily as needed for mild constipation.   Yes [provider]  rosuvastatin (CRESTOR) 40 MG tablet Take 1 tablet (40 mg total) by mouth every evening. 04/12/18  Yes Bensimhon, Shaune Pascal, MD  sildenafil (REVATIO) 20 MG tablet Take 5 tablets (100 mg total) by mouth as needed. 11/28/19  Yes [name], MD  spironolactone (ALDACTONE) 25 MG tablet TAKE (1/2) TABLET DAILY. 10/04/19  Yes Bensimhon, Shaune Pascal, MD  sucralfate (CARAFATE) 1 g tablet Take 1 tablet (1 g  total) by mouth 4 (four) times daily -  with meals and at bedtime. 03/14/18  Yes Bloomfield, Carley D, DO  tamsulosin (FLOMAX) 0.4 MG CAPS capsule Take 1 capsule (0.4 mg total) by mouth daily after breakfast. 11/21/19  Yes [name], MD  TESTOSTERONE CYPIONATE IM Inject into the muscle every 14 (fourteen) days.   Yes [provider]  torsemide (DEMADEX) 20 MG tablet Take 1 tablet (20 mg total) by mouth daily as needed. For swelling in legs 10/07/15  Yes Bensimhon, Shaune Pascal, MD  traZODone (DESYREL) 100 MG tablet Take 100 mg by mouth at bedtime.    Yes [provider]  triamcinolone (NASACORT AQ) 55 MCG/ACT AERO nasal inhaler Place 1 spray into the nose daily. 04/24/16  Yes Chesley Mires, MD  Vitamin D, Ergocalciferol, (DRISDOL) 1.25 MG (50000 UNIT) CAPS capsule Take 1 capsule (50,000 Units total) by mouth every 7 (seven) days. 12/04/19  Yes [name], MD  zolpidem (AMBIEN) 10 MG tablet Take 10 mg by mouth at bedtime.  07/11/18  Yes [provider]    Past Medical History:  Diagnosis Date  . Anxiety   . Automatic implantable cardiac defibrillator [facility]    Analyze ST  . Benign prostatic hypertrophy   . Benzodiazepine dependence (HCC)    chronic  . CAD (coronary artery disease)    Last cath 2/12. 3-v CAD. Failed PCI of distal RCAc  CATH [facility] 4/13 with DES to  LAD X2  . CHF (congestive heart failure) ([place])    . Chronic back pain    lumbar stenosis  . Chronic systolic heart failure (HCC)    EF 20-25%. s/p ST. Jude ICD  . Depression   . DJD (degenerative joint disease)   . History of testicular cancer   . Ischemic cardiomyopathy   . Myocardial infarction ([place])   . Narcotic dependence (HCC)    chronic  . OSA on CPAP   . Polycythemia, secondary    S/p hematology consultation for polycythema on 01/06/18.  S/p bone marrow biopsy on 12/31/17 that was negative for myeloproliferative disorder.  Feels secondary to sleep apnea, active smoking, testosterone supplementation, nocturnal hypoxia  . TIA (transient ischemic attack)   . Vitamin B12 deficiency 07/22/2016    Past Surgical History:  Procedure Laterality Date  . ASD REPAIR, SINUS VENOSUS    . CARDIAC DEFIBRILLATOR PLACEMENT  08/2010  . HERNIA REPAIR    . LEFT HEART CATHETERIZATION WITH CORONARY ANGIOGRAM N/A 06/14/2014   Procedure: LEFT HEART CATHETERIZATION WITH CORONARY ANGIOGRAM;  Surgeon: Jolaine Artist, MD;  [facility] C[facility] LAB;  Service: Cardiovascular;  Laterality: N/A;  . TESTICLE SURGERY     testicular cancer surgery left testicle removed    Social History   Tobacco Use  . Smoking status: Current Every Day Smoker    Packs/day: 0.50    Years: 29.00    Pack years: 14.50    Types: Cigarettes, E-cigarettes    Last attempt to quit: 07/14/2015    Years since quitting: 4.4  . Smokeless tobacco: Never Used  . Tobacco comment: no cigarette in 4 days but yes to e-cig  Substance Use Topics  . Alcohol use: No    Alcohol/week: 0.0 standard drinks    Family History  Problem Relation Age of Onset  . Heart disease Father 66       Died of MI  . Cancer Mother     ROS Per hpi  OBJECTIVE:  Today's Vitals   12/07/19  1014  BP: 108/65  Pulse: 60  Temp: 98 F (36.7 C)  SpO2: 98%  Weight: 205 lb (93 kg)  Height: 6' (1.829 m)   Body mass index is 27.8 kg/m.   Physical Exam Vitals and nursing note reviewed.   Constitutional:      Appearance: He is well-developed.  HENT:     Head: Normocephalic and atraumatic.  Eyes:     Conjunctiva/sclera: Conjunctivae normal.     Pupils: Pupils are equal, round, and reactive to light.  Pulmonary:     Effort: Pulmonary effort is normal.  Musculoskeletal:     Cervical back: Neck supple.     Right hip: Normal.     Right knee: Normal.  Skin:    General: Skin is warm and dry.  Neurological:     Mental Status: He is alert and oriented to person, place, and time.     No results found. However, due to the size of the patient record, not all encounters were searched. Please check Results Review for a complete set of results.  DG Sacrum/Coccyx  Result Date: 12/07/2019 CLINICAL DATA:  Pain after fall 1 week ago. EXAM: SACRUM AND COCCYX - 2+ VIEW COMPARISON:  08/11/2019 FINDINGS: There is no evidence of fracture or other focal bone lesions. Moderate degenerative change of the spine with multilevel disc disease over the lower lumbar spine. IMPRESSION: No acute findings. Electronically Signed   By: Marin Olp M.D.   On: 12/07/2019 11:12   DG Hip Unilat W OR W/O Pelvis 2-3 Views Right  Result Date: 12/07/2019 CLINICAL DATA:  Pain after fall 1 week ago. EXAM: DG HIP (WITH OR WITHOUT PELVIS) 2-3V RIGHT COMPARISON:  12/22/2017 FINDINGS: Minimal symmetric degenerative changes of the hips. No evidence of acute fracture or dislocation. Moderate degenerative change of the spine. IMPRESSION: No acute findings. Electronically Signed   By: Marin Olp M.D.   On: 12/07/2019 11:10     ASSESSMENT and PLAN  1. Coccyx pain - DG Sacrum/Coccyx  2. Pain in right hip - DG Hip Unilat W OR W/O Pelvis 2-3 Views Right  Patient with no fracture. Patient on methadone and with very close visit with neurosurg. ER precautions given concerning sx of intermittent saddle anesthesia. Unfortunately per patient, neurosurg has stated not favoring surgery.  Return in about 4 weeks (around  01/04/2020).    [name], MD Primary [facility] at [facility] [place], [place] [address] Ph.  [phone] Fax [phone]

## 2019-12-08 ENCOUNTER — Inpatient Hospital Stay: Payer: BC Managed Care – PPO | Attending: Oncology | Admitting: Oncology

## 2019-12-08 VITALS — BP 122/80 | HR 96 | Temp 97.9°F | Resp 17 | Ht 72.0 in | Wt 194.2 lb

## 2019-12-08 DIAGNOSIS — D751 Secondary polycythemia: Secondary | ICD-10-CM

## 2019-12-08 DIAGNOSIS — R531 Weakness: Secondary | ICD-10-CM | POA: Insufficient documentation

## 2019-12-08 DIAGNOSIS — R627 Adult failure to thrive: Secondary | ICD-10-CM | POA: Diagnosis not present

## 2019-12-08 DIAGNOSIS — Z79899 Other long term (current) drug therapy: Secondary | ICD-10-CM | POA: Diagnosis not present

## 2019-12-08 NOTE — Progress Notes (Signed)
Hematology and Oncology Follow Up Visit  [name] [id number] 05-Jul-1961 59 y.o. 12/08/2019 3:39 PM [name], MDSantiago, [name], MD   Principle Diagnosis: 59 year old secondary polycythemia diagnosed in January 2019. His work-up ruled out a myeloproliferative disorder including a bone marrow biopsy and molecular testing. This is likely related to his sleep apnea.   Current therapy: No active treatment is needed..  Interim History: [name] is here for return evaluation. Since the last visit, he reports constellation of symptoms and overall decline in his health.  He has reported limited mobility and increase in his back pain lower extremity weakness.  He has been diagnosed with spinal stenosis and has been seen by multiple specialists in the past.  He continues to have issues with failure to thrive and weight loss.  His quality of life is declining and reporting more anxiety and depression and searching for answers.   Medications: Reviewed without changes. Current Outpatient Medications  Medication Sig Dispense Refill  . albuterol (VENTOLIN HFA) 108 (90 Base) MCG/ACT inhaler Inhale 2 puffs into the lungs every 6 (six) hours as needed for wheezing or shortness of breath. 18 g 5  . ALPRAZolam (XANAX) 1 MG tablet Take 1 mg by mouth 4 (four) times daily.     . carvedilol (COREG) 25 MG tablet Take 25 mg by mouth 2 (two) times daily with a meal.    . clonazePAM (KLONOPIN) 1 MG tablet Take 1 mg by mouth 3 (three) times daily as needed.    . clopidogrel (PLAVIX) 75 MG tablet Take 1 tablet (75 mg total) by mouth daily. Pt needs to schedule an appt with our office at [phone] for future refills. 30 tablet 2  . cyanocobalamin (,VITAMIN B-12,) 1000 MCG/ML injection Inject 1 mL (1,000 mcg total) into the muscle every 30 (thirty) days. 1 mL 11  . dapagliflozin propanediol (FARXIGA) 10 MG TABS tablet Take 10 mg by mouth daily before breakfast. 30 tablet 6  . diazepam (VALIUM) 10 MG tablet  Take 20 mg by mouth every 12 (twelve) hours as needed for anxiety.   2  . erythromycin ophthalmic ointment Place 1 application into the right eye at bedtime. 3.5 g 1  . ezetimibe (ZETIA) 10 MG tablet TAKE 1 TABLET EACH DAY. 90 tablet 1  . ibuprofen (ADVIL,MOTRIN) 200 MG tablet Take 800 mg by mouth every 6 (six) hours as needed for moderate pain.    Marland Kitchen lactulose (CHRONULAC) 10 GM/15ML solution Take 15 mLs (10 g total) by mouth 2 (two) times daily as needed for mild constipation. 1892 mL 0  . lisinopril (PRINIVIL,ZESTRIL) 5 MG tablet Take 5 mg by mouth daily.     Marland Kitchen loperamide (IMODIUM A-D) 2 MG tablet Take 1.5 tablets (3 mg total) by mouth 4 (four) times daily as needed for diarrhea or loose stools. 30 tablet 2  . losartan (COZAAR) 25 MG tablet Take 1 tablet (25 mg total) by mouth daily. 90 tablet 3  . methadone (METHADOSE) 40 MG disintegrating tablet Take 200 mg by mouth daily.    . methocarbamol (ROBAXIN) 500 MG tablet Take 1.5 tablets (750 mg total) by mouth every 8 (eight) hours as needed for muscle spasms. 90 tablet 1  . methocarbamol (ROBAXIN) 750 MG tablet TAKE 1 OR 2 TABLETS THREE TIMES A DAY AS NEEDED. 180 tablet 5  . naproxen (NAPROSYN) 500 MG tablet Take 1 tablet (500 mg total) by mouth 2 (two) times daily as needed for moderate pain. 60 tablet 2  .  nitroGLYCERIN (NITROLINGUAL) 0.4 MG/SPRAY spray Place 2 sprays under the tongue every 5 (five) minutes x 3 doses as needed for chest pain.     Marland Kitchen ondansetron (ZOFRAN-ODT) 8 MG disintegrating tablet Take 1 tablet (8 mg total) by mouth every 8 (eight) hours as needed for nausea or vomiting. 90 tablet 1  . oxymetazoline (AFRIN) 0.05 % nasal spray Place 1 spray into both nostrils 2 (two) times daily.    . pantoprazole (PROTONIX) 40 MG tablet Take 1 tablet (40 mg total) by mouth daily. 90 tablet 1  . polyethylene glycol powder (MIRALAX) 17 GM/SCOOP powder Take 17 g by mouth 2 (two) times daily as needed for mild constipation.    . rosuvastatin  (CRESTOR) 40 MG tablet Take 1 tablet (40 mg total) by mouth every evening. 30 tablet 2  . sildenafil (REVATIO) 20 MG tablet Take 5 tablets (100 mg total) by mouth as needed. 90 tablet 5  . spironolactone (ALDACTONE) 25 MG tablet TAKE (1/2) TABLET DAILY. 45 tablet 1  . sucralfate (CARAFATE) 1 g tablet Take 1 tablet (1 g total) by mouth 4 (four) times daily -  with meals and at bedtime. 120 tablet 0  . tamsulosin (FLOMAX) 0.4 MG CAPS capsule Take 1 capsule (0.4 mg total) by mouth daily after breakfast. 90 capsule 0  . TESTOSTERONE CYPIONATE IM Inject into the muscle every 14 (fourteen) days.    Marland Kitchen torsemide (DEMADEX) 20 MG tablet Take 1 tablet (20 mg total) by mouth daily as needed. For swelling in legs 90 tablet 2  . traZODone (DESYREL) 100 MG tablet Take 100 mg by mouth at bedtime.     . triamcinolone (NASACORT AQ) 55 MCG/ACT AERO nasal inhaler Place 1 spray into the nose daily. 1 Inhaler 12  . Vitamin D, Ergocalciferol, (DRISDOL) 1.25 MG (50000 UNIT) CAPS capsule Take 1 capsule (50,000 Units total) by mouth every 7 (seven) days. 12 capsule 1  . zolpidem (AMBIEN) 10 MG tablet Take 10 mg by mouth at bedtime.      No current facility-administered medications for this visit.     Allergies:  Allergies  Allergen Reactions  . Darvocet [Propoxyphene N-Acetaminophen] Anaphylaxis    Throat closes        Physical Exam: Blood pressure 122/80, pulse 96, temperature 97.9 F (36.6 C), temperature source Temporal, resp. rate 17, height 6' (1.829 m), weight 194 lb 3.2 oz (88.1 kg), SpO2 97 %.    ECOG: 2    General appearance: Comfortable appearing without any discomfort.  Appears chronically ill. Head: Normocephalic without any trauma Oropharynx: Mucous membranes are moist and pink without any thrush or ulcers. Eyes: Pupils are equal and round reactive to light. Lymph nodes: No cervical, supraclavicular, inguinal or axillary lymphadenopathy.   Heart:regular rate and rhythm.  S1 and S2  without leg edema. Lung: Clear without any rhonchi or wheezes.  No dullness to percussion. Abdomin: Soft, nontender, nondistended with good bowel sounds.  No hepatosplenomegaly. Musculoskeletal: No joint deformity or effusion.  Full range of motion noted. Neurological: No focal deficits but generalized weakness. Skin: No petechial rash or dryness.  Appeared moist.       Lab Results: Lab Results  Component Value Date   WBC 4.5 11/21/2019   HGB 17.7 11/21/2019   HCT 50.4 11/21/2019   MCV 94 11/21/2019   PLT 143 (L) 11/21/2019     Chemistry      Component Value Date/Time   NA 124 (L) 11/21/2019 1126   K 4.0 11/21/2019 1126  CL 86 (L) 11/21/2019 1126   CO2 26 11/21/2019 1126   BUN 14 11/21/2019 1126   CREATININE 0.94 11/21/2019 1126   CREATININE 0.97 08/26/2017 1152   CREATININE 0.97 12/04/2014 1644      Component Value Date/Time   CALCIUM 9.5 11/21/2019 1126   ALKPHOS 118 (H) 11/21/2019 1126   AST 14 11/21/2019 1126   AST 14 08/26/2017 1152   ALT 9 11/21/2019 1126   ALT 10 08/26/2017 1152   BILITOT 0.3 11/21/2019 1126   BILITOT 0.7 08/26/2017 1152        Impression and Plan:  59 year old man with the following:  1. Secondary polycythemia noted in 2017 with work-up did not reveal a myeloproliferative disorder. This includes molecular testing as well as a bone marrow biopsy.  Laboratory testing on Nov 21, 2019 were reviewed and showed white cell count of 4.5 with a hemoglobin of 17.7 platelet count of 143. Is mild elevation hemoglobin remains consistent with previous labs. His hemoglobin in 2020 was as high as 18.5 but has spontaneously improved likely after the treatment for his obstructive sleep apnea.  He continues to smoke heavily at this time which is contributing to his polycythemia.   I see no evidence to suggest a hematological disorder or any malignancy at this time. I do not think any of his symptoms are related to the mild elevation in his hemoglobin. Do  not think he needs a phlebotomy at this time.  2.  Generalized weakness and failure to thrive: Appears to be multifactorial in nature and unrelated to blood disorder or malignancy.  I urged him to address this with his primary care physician and I believe he would benefit from palliative care services at home.  2.  Follow-up: No hematology follow-up repeat laboratory is needed.  30  minutes were dedicated to this visit. The time was spent on reviewing laboratory data, discussing treatment options, discussing differential diagnosis and answering questions regarding future plan.      Zola Button, MD 5/28/20213:39 PM

## 2019-12-18 ENCOUNTER — Ambulatory Visit (HOSPITAL_COMMUNITY)
Admission: RE | Admit: 2019-12-18 | Discharge: 2019-12-18 | Disposition: A | Discharge status: Home / Self Care | Payer: BC Managed Care – PPO | Source: Ambulatory Visit | Attending: Internal Medicine | Admitting: Internal Medicine

## 2019-12-18 ENCOUNTER — Other Ambulatory Visit: Payer: Self-pay

## 2019-12-18 DIAGNOSIS — I5042 Chronic combined systolic (congestive) and diastolic (congestive) heart failure: Secondary | ICD-10-CM | POA: Diagnosis not present

## 2019-12-18 DIAGNOSIS — I2510 Atherosclerotic heart disease of native coronary artery without angina pectoris: Secondary | ICD-10-CM

## 2019-12-18 DIAGNOSIS — I5022 Chronic systolic (congestive) heart failure: Secondary | ICD-10-CM

## 2019-12-18 DIAGNOSIS — Z720 Tobacco use: Secondary | ICD-10-CM

## 2019-12-18 NOTE — Progress Notes (Signed)
Heart Failure TeleHealth Note  Due to national recommendations of social distancing due to [place] 19, Audio/video telehealth visit is felt to be most appropriate for this patient at this time.  See MyChart message from today for patient consent regarding telehealth for [name]. The patient was identified personally using two identifiers.   Date:  12/18/2019   ID:  [name], DOB Feb 06, 1961, MRN [id number]  Location: Home  Provider location: [facility] Type of Visit: Established patient  PCP:  Trevor Guys, MD  Cardiologist:  No primary care provider on file. 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 Jude  ICD implantation. Was enrolled in [employer] ST.   Has been followed recently by Dr. Stann Mainland at [facility]. Underwent cath at [facility] in 3/15 and had 2.5x43m Xience DES placed in LAD. Had cath 12/15 here with stable CAD. Repeat cath set up in 10/18 but he cancelled.   Echo 8/19; EF 30-35% (stable) mild RV dilation.  Echo 07/18/19 EF 40-45% mild MR  He presents via audio/video conferencing for a telehealth visit today.  Says he is feeling ok. Wandering thought process. Says his divorce was finished today. Denies CP or SOB. No edema, orthopnea or PND. Still with severe back plain. Recent myelogram 3/21 - severe spinal canal stenosis with dynamic instability from L2-L3 through L4-L5.      [name] symptoms worrisome for COVID 19.   Past Medical History:  Diagnosis Date  . Anxiety   . Automatic implantable cardiac defibrillator St Judes    Analyze ST  . Benign prostatic hypertrophy   . Benzodiazepine  dependence (HCC)    chronic  . CAD (coronary artery disease)    Last cath 2/12. 3-v CAD. Failed PCI of distal RCAc  CATH [facility] 4/13 with DES to  LAD X2  . CHF ([place]estive heart failure) ([place]   . Chronic back pain    lumbar stenosis  . Chronic systolic heart failure (HCC)    EF 20-25%. s/p ST. Jude ICD  . Depression   . DJD (degenerative joint disease)   . History of testicular cancer   . Ischemic cardiomyopathy   . Myocardial infarction ([place]   . Narcotic dependence (HCC)    chronic  . OSA on CPAP   . Polycythemia, secondary    S/p hematology consultation for polycythema on 01/06/18.  S/p bone marrow biopsy on 12/31/17 that was negative for myeloproliferative disorder.  Feels secondary to sleep apnea, active smoking, testosterone supplementation, nocturnal hypoxia  . TIA (transient ischemic attack)   . Vitamin B12 deficiency 07/22/2016   Past Surgical History:  Procedure Laterality Date  . ASD REPAIR, SINUS VENOSUS    . CARDIAC DEFIBRILLATOR PLACEMENT  08/2010  . HERNIA REPAIR    . LEFT HEART CATHETERIZATION WITH CORONARY ANGIOGRAM N/A 06/14/2014   Procedure: LEFT HEART CATHETERIZATION WITH CORONARY ANGIOGRAM;  Surgeon: DJolaine Artist MD;  [facility];  Service: Cardiovascular;  Laterality: N/A;  . TESTICLE SURGERY     testicular cancer surgery left testicle removed     Current Outpatient Medications  Medication Sig Dispense Refill  . albuterol (VENTOLIN HFA) 108 (  90 Base) MCG/ACT inhaler Inhale 2 puffs into the lungs every 6 (six) hours as needed for wheezing or shortness of breath. 18 g 5  . ALPRAZolam (XANAX) 1 MG tablet Take 1 mg by mouth 4 (four) times daily.     . carvedilol (COREG) 25 MG tablet Take 25 mg by mouth 2 (two) times daily with a meal.    . clonazePAM (KLONOPIN) 1 MG tablet Take 1 mg by mouth 3 (three) times daily as needed.    . clopidogrel (PLAVIX) 75 MG tablet Take 1 tablet (75 mg total) by mouth daily. Pt needs to schedule an appt with our  office at [phone] for future refills. 30 tablet 2  . cyanocobalamin (,VITAMIN B-12,) 1000 MCG/ML injection Inject 1 mL (1,000 mcg total) into the muscle every 30 (thirty) days. 1 mL 11  . dapagliflozin propanediol (FARXIGA) 10 MG TABS tablet Take 10 mg by mouth daily before breakfast. 30 tablet 6  . diazepam (VALIUM) 10 MG tablet Take 20 mg by mouth every 12 (twelve) hours as needed for anxiety.   2  . erythromycin ophthalmic ointment Place 1 application into the right eye at bedtime. 3.5 g 1  . ezetimibe (ZETIA) 10 MG tablet TAKE 1 TABLET EACH DAY. 90 tablet 1  . ibuprofen (ADVIL,MOTRIN) 200 MG tablet Take 800 mg by mouth every 6 (six) hours as needed for moderate pain.    Marland Kitchen lactulose (CHRONULAC) 10 GM/15ML solution Take 15 mLs (10 g total) by mouth 2 (two) times daily as needed for mild constipation. 1892 mL 0  . lisinopril (PRINIVIL,ZESTRIL) 5 MG tablet Take 5 mg by mouth daily.     Marland Kitchen loperamide (IMODIUM A-D) 2 MG tablet Take 1.5 tablets (3 mg total) by mouth 4 (four) times daily as needed for diarrhea or loose stools. 30 tablet 2  . losartan (COZAAR) 25 MG tablet Take 1 tablet (25 mg total) by mouth daily. 90 tablet 3  . methadone (METHADOSE) 40 MG disintegrating tablet Take 200 mg by mouth daily.    . methocarbamol (ROBAXIN) 500 MG tablet Take 1.5 tablets (750 mg total) by mouth every 8 (eight) hours as needed for muscle spasms. 90 tablet 1  . methocarbamol (ROBAXIN) 750 MG tablet TAKE 1 OR 2 TABLETS THREE TIMES A DAY AS NEEDED. 180 tablet 5  . naproxen (NAPROSYN) 500 MG tablet Take 1 tablet (500 mg total) by mouth 2 (two) times daily as needed for moderate pain. 60 tablet 2  . nitroGLYCERIN (NITROLINGUAL) 0.4 MG/SPRAY spray Place 2 sprays under the tongue every 5 (five) minutes x 3 doses as needed for chest pain.     Marland Kitchen ondansetron (ZOFRAN-ODT) 8 MG disintegrating tablet Take 1 tablet (8 mg total) by mouth every 8 (eight) hours as needed for nausea or vomiting. 90 tablet 1  . oxymetazoline  (AFRIN) 0.05 % nasal spray Place 1 spray into both nostrils 2 (two) times daily.    . pantoprazole (PROTONIX) 40 MG tablet Take 1 tablet (40 mg total) by mouth daily. 90 tablet 1  . polyethylene glycol powder (MIRALAX) 17 GM/SCOOP powder Take 17 g by mouth 2 (two) times daily as needed for mild constipation.    . rosuvastatin (CRESTOR) 40 MG tablet Take 1 tablet (40 mg total) by mouth every evening. 30 tablet 2  . sildenafil (REVATIO) 20 MG tablet Take 5 tablets (100 mg total) by mouth as needed. 90 tablet 5  . spironolactone (ALDACTONE) 25 MG tablet TAKE (1/2) TABLET DAILY. 45 tablet 1  .  sucralfate (CARAFATE) 1 g tablet Take 1 tablet (1 g total) by mouth 4 (four) times daily -  with meals and at bedtime. 120 tablet 0  . tamsulosin (FLOMAX) 0.4 MG CAPS capsule Take 1 capsule (0.4 mg total) by mouth daily after breakfast. 90 capsule 0  . TESTOSTERONE CYPIONATE IM Inject into the muscle every 14 (fourteen) days.    Marland Kitchen torsemide (DEMADEX) 20 MG tablet Take 1 tablet (20 mg total) by mouth daily as needed. For swelling in legs 90 tablet 2  . traZODone (DESYREL) 100 MG tablet Take 100 mg by mouth at bedtime.     . triamcinolone (NASACORT AQ) 55 MCG/ACT AERO nasal inhaler Place 1 spray into the nose daily. 1 Inhaler 12  . Vitamin D, Ergocalciferol, (DRISDOL) 1.25 MG (50000 UNIT) CAPS capsule Take 1 capsule (50,000 Units total) by mouth every 7 (seven) days. 12 capsule 1  . zolpidem (AMBIEN) 10 MG tablet Take 10 mg by mouth at bedtime.      No current facility-administered medications for this encounter.    Allergies:   Darvocet [propoxyphene n-acetaminophen]   Social History:  The patient  reports that he has been smoking cigarettes and e-cigarettes. He has a 14.50 pack-year smoking history. He has never used smokeless tobacco. He reports current drug use. Drug: Marijuana. He reports that he does not drink alcohol.   Family History:  The patient's family history includes Cancer in his mother; Heart  disease (age of onset: 48) in his father.   ROS:  Please see the history of present illness.   All other systems are personally reviewed and negative.   Exam:  (Video/Tele Health Call; Exam is subjective and or/visual.) General:  Speaks in full sentences. No resp difficulty.  Lungs: Normal respiratory effort with conversation.  Abdomen: Non-distended per patient report Extremities: Pt denies edema. Neuro: Alert & oriented x 3.   Recent Labs: 11/21/2019: ALT 9; BUN 14; Creatinine, Ser 0.94; Hemoglobin 17.7; Magnesium 1.9; Platelets 143; Potassium 4.0; Sodium 124; TSH 0.929  Personally reviewed   Wt Readings from Last 3 Encounters:  12/08/19 88.1 kg (194 lb 3.2 oz)  12/07/19 93 kg (205 lb)  08/14/19 91.9 kg (202 lb 9.6 oz)      ASSESSMENT AND PLAN:  1. Chronic systolic HF - Echo 3/87 EF 30-35%. S/p STJ ICD - Echo 07/18/19 EF 40-45% - Remains stable NYHA II-III from cardiac perspective. Confounded by orthopedic issues.  - Continue losartan 25 and Farxiga 10  - Continue carvedilol and spiro.  2. CAD with ischemic CM - Stable by cath 2015 - No current s/s ischemia - Continue DAPT, statin and b-blocker 3. Polycythemia  - Followed by [facility] Annabelle Harman at [facility] and felt to be due to chronic hypoxia from OSA.  - Recently seen by Dr. Alen Blew who agreed - Recent labs improved 4. Chronic respiratory failure - now off O2 5. Tobacco use - continues to smoke a few cigs/day. No change 6. Spinal stenosis - this is severe. - follows with [facility]   COVID screen The patient does not have any symptoms that suggest any further testing/ screening at this time.  Social distancing reinforced today.  Recommended follow-up:  As above  Relevant cardiac medications were reviewed at length with the patient today.   The patient does not have concerns regarding their medications at this time.   The following changes were made today:  As above  Today, I have spent 14 minutes with the patient  with telehealth  technology discussing the above issues .    Signed, Glori Bickers, MD  12/18/2019 2:46 PM  [facility] [place] [address] [facility] [place] [address] [phone] (office) [phone] (fax)

## 2019-12-19 ENCOUNTER — Encounter (HOSPITAL_COMMUNITY): Payer: Self-pay | Admitting: *Deleted

## 2019-12-20 DIAGNOSIS — K5900 Constipation, unspecified: Secondary | ICD-10-CM | POA: Diagnosis not present

## 2019-12-20 DIAGNOSIS — R112 Nausea with vomiting, unspecified: Secondary | ICD-10-CM | POA: Diagnosis not present

## 2019-12-20 DIAGNOSIS — R197 Diarrhea, unspecified: Secondary | ICD-10-CM | POA: Diagnosis not present

## 2019-12-20 DIAGNOSIS — I5020 Unspecified systolic (congestive) heart failure: Secondary | ICD-10-CM | POA: Diagnosis not present

## 2020-01-04 ENCOUNTER — Ambulatory Visit (INDEPENDENT_AMBULATORY_CARE_PROVIDER_SITE_OTHER): Payer: BC Managed Care – PPO | Admitting: *Deleted

## 2020-01-04 DIAGNOSIS — I255 Ischemic cardiomyopathy: Secondary | ICD-10-CM | POA: Diagnosis not present

## 2020-01-04 LAB — CUP PACEART REMOTE DEVICE CHECK
Battery Remaining Longevity: 16 mo
Battery Remaining Percentage: 14 %
Battery Voltage: 2.72 V
Brady Statistic AP VP Percent: 1 %
Brady Statistic AP VS Percent: 1 %
Brady Statistic AS VP Percent: 1 %
Brady Statistic AS VS Percent: 99 %
Brady Statistic RA Percent Paced: 1 %
Brady Statistic RV Percent Paced: 1 %
Date Time Interrogation Session: 20210624022954
HighPow Impedance: 66 Ohm
HighPow Impedance: 66 Ohm
Implantable Lead Implant Date: 20120201
Implantable Lead Implant Date: 20120201
Implantable Lead Location: 753859
Implantable Lead Location: 753860
Implantable Pulse Generator Implant Date: 20120201
Lead Channel Impedance Value: 340 Ohm
Lead Channel Impedance Value: 400 Ohm
Lead Channel Pacing Threshold Amplitude: 0.75 V
Lead Channel Pacing Threshold Amplitude: 1.75 V
Lead Channel Pacing Threshold Pulse Width: 0.5 ms
Lead Channel Pacing Threshold Pulse Width: 0.5 ms
Lead Channel Sensing Intrinsic Amplitude: 12 mV
Lead Channel Sensing Intrinsic Amplitude: 3.5 mV
Lead Channel Setting Pacing Amplitude: 2 V
Lead Channel Setting Pacing Amplitude: 3 V
Lead Channel Setting Pacing Pulse Width: 0.5 ms
Lead Channel Setting Sensing Sensitivity: 0.5 mV
Pulse Gen Serial Number: 815096

## 2020-01-05 NOTE — Progress Notes (Signed)
Remote ICD transmission.   

## 2020-01-09 ENCOUNTER — Ambulatory Visit (INDEPENDENT_AMBULATORY_CARE_PROVIDER_SITE_OTHER): Payer: BC Managed Care – PPO | Admitting: Family Medicine

## 2020-01-09 ENCOUNTER — Encounter: Payer: Self-pay | Admitting: Family Medicine

## 2020-01-09 ENCOUNTER — Other Ambulatory Visit: Payer: Self-pay

## 2020-01-09 ENCOUNTER — Telehealth: Payer: Self-pay

## 2020-01-09 VITALS — BP 135/90 | HR 96 | Temp 97.5°F | Ht 72.0 in

## 2020-01-09 DIAGNOSIS — M6281 Muscle weakness (generalized): Secondary | ICD-10-CM

## 2020-01-09 DIAGNOSIS — K5903 Drug induced constipation: Secondary | ICD-10-CM

## 2020-01-09 DIAGNOSIS — R339 Retention of urine, unspecified: Secondary | ICD-10-CM

## 2020-01-09 DIAGNOSIS — Z7409 Other reduced mobility: Secondary | ICD-10-CM

## 2020-01-09 DIAGNOSIS — R296 Repeated falls: Secondary | ICD-10-CM

## 2020-01-09 DIAGNOSIS — Z1211 Encounter for screening for malignant neoplasm of colon: Secondary | ICD-10-CM

## 2020-01-09 DIAGNOSIS — M48062 Spinal stenosis, lumbar region with neurogenic claudication: Secondary | ICD-10-CM

## 2020-01-09 DIAGNOSIS — N529 Male erectile dysfunction, unspecified: Secondary | ICD-10-CM

## 2020-01-09 DIAGNOSIS — Z789 Other specified health status: Secondary | ICD-10-CM

## 2020-01-09 DIAGNOSIS — R110 Nausea: Secondary | ICD-10-CM

## 2020-01-09 MED ORDER — LACTULOSE 10 GM/15ML PO SOLN: 10.0000 g | Freq: Two times a day (BID) | ORAL | 0 refills | Status: DC | PRN

## 2020-01-09 MED ORDER — TRAZODONE HCL 100 MG PO TABS: 100.0000 mg | ORAL_TABLET | Freq: Every day | ORAL | 0 refills | Status: DC

## 2020-01-09 MED ORDER — ONDANSETRON 8 MG PO TBDP: 8.0000 mg | ORAL_TABLET | Freq: Three times a day (TID) | ORAL | 1 refills | Status: DC | PRN

## 2020-01-09 MED ORDER — SILDENAFIL CITRATE 20 MG PO TABS: 100.0000 mg | ORAL_TABLET | ORAL | 5 refills | Status: DC | PRN

## 2020-01-09 MED ORDER — TORSEMIDE 20 MG PO TABS: 20.0000 mg | ORAL_TABLET | Freq: Every day | ORAL | 2 refills | Status: DC | PRN

## 2020-01-09 MED ORDER — NAPROXEN 500 MG PO TABS: 500.0000 mg | ORAL_TABLET | Freq: Two times a day (BID) | ORAL | 2 refills | Status: DC | PRN

## 2020-01-09 NOTE — Telephone Encounter (Signed)
Trying to reach pt for colonoscopy scheduling with Dr. Benson Norway. Pt cell goes straight to vm.

## 2020-01-09 NOTE — Progress Notes (Signed)
6/29/20212:49 PM  [name] 24-Aug-1960, 59 y.o., male [id number]  Chief Complaint  Patient presents with  . Pain    pain all over the body, no longer able to walk, having trouble sleeping    HPI:   Patient is a 59 y.o. male with past medical history significant for CAD with cardiomyopathy, ICD in place, PAD, complex sleep apnea on bipap with 5L, testicular cancer,lumbar stenosis, narcotic and benzo dependence, COPD, chronic nausea, TB MAC,secondary polycythemia, c diffwho presents today forseveral concerns  Patient presents with [name], new care giver He has severe spinal stenosis, and despite improvement in cardiac function, not deemed good surgical candidate Evaluated by Trevor Patrice Paradise at [facility] Patient has been experiencing progressive leg weakness and loss of mobility Uses rollator walker with seat Needs one person assistance for transfers and ambulation Having more falls within house Having increased difficulty with urination, having more episodes of taking over 2 hours to urinate, associated with pain and diaphoresis, not under urology care Having increasing perianal numbness Has chronic constipation due to opioids, uses lactulose On methadone thru methadone clinic, high doses Uses methacarbamol for muscle spasms He would like to have a [name] eval for improved assisted devices and home DME that would make home safer  He is need of routine colon cancer screening He was previously seen at [facility] He has most recently seen [name] with plans for routine colonoscopy however patient has learned that Trevor Cammy [name] has not retired as thought and he would like to re-establish  Trevor [name] preformed EGD/Colonsoscopy in 2006, only documented procedures available for review. Findings then possible gastroparesis and colitis, incomplete prep Patient reports h/o colonic polyps but no records to confirm    Depression screen [facility] 2/9 11/10/2019 05/25/2019  03/03/2019  Decreased Interest 0 0 0  Down, Depressed, Hopeless 0 0 0  PHQ - 2 Score 0 0 0  Altered sleeping - - -  Tired, decreased energy - - -  Change in appetite - - -  Feeling bad or failure about yourself  - - -  Trouble concentrating - - -  Moving slowly or fidgety/restless - - -  Suicidal thoughts - - -  PHQ-9 Score - - -  Difficult doing work/chores - - -  Some recent data might be hidden    Fall Risk  12/07/2019 11/10/2019 05/25/2019 03/03/2019 03/03/2019  Falls in the past year? 1 0 _0 Comment - - - - -  Number falls in past yr: 1 0 _1 Comment - - - - -  Injury with Fall? 1 0 _2 Comment - - - - injuries - head, right knee, fx right hip  from the falls  Risk Factor Category  - - - - -  Risk for fall due to : - - Impaired balance/gait;Impaired mobility;Medication side effect;Mental status change;History of fall(s);Other (Comment) - -  Risk for fall due to: Comment - - - - -  Follow up - Falls evaluation completed - Falls evaluation completed -  Comment - - - - -     Allergies  Allergen Reactions  . Darvocet [Propoxyphene N-Acetaminophen] Anaphylaxis    Throat closes    Prior to Admission medications   Medication Sig Start Date End Date Taking? Authorizing Provider  albuterol (VENTOLIN HFA) 108 (90 Base) MCG/ACT inhaler Inhale 2 puffs into the lungs every 6 (six) hours as needed for wheezing or shortness of  breath. 11/21/19  Yes [name], MD  ALPRAZolam Duanne Moron) 1 MG tablet Take 1 mg by mouth 4 (four) times daily.    Yes [provider]  carvedilol (COREG) 25 MG tablet Take 25 mg by mouth 2 (two) times daily with a meal.   Yes [provider]  clonazePAM (KLONOPIN) 1 MG tablet Take 1 mg by mouth 3 (three) times daily as needed. 06/16/19  Yes [provider]  clopidogrel (PLAVIX) 75 MG tablet Take 1 tablet (75 mg total) by mouth daily. [name] needs to schedule an appt with our office at [phone] for future refills. 04/12/18  Yes  Bensimhon, Shaune Pascal, MD  cyanocobalamin (,VITAMIN B-12,) 1000 MCG/ML injection Inject 1 mL (1,000 mcg total) into the muscle every 30 (thirty) days. 05/25/19  Yes [name], MD  dapagliflozin propanediol (FARXIGA) 10 MG TABS tablet Take 10 mg by mouth daily before breakfast. 06/21/19  Yes Bensimhon, Shaune Pascal, MD  diazepam (VALIUM) 10 MG tablet Take 20 mg by mouth every 12 (twelve) hours as needed for anxiety.  06/04/17  Yes [provider]  erythromycin ophthalmic ointment Place 1 application into the right eye at bedtime. 03/03/19  Yes [name], MD  ezetimibe (ZETIA) 10 MG tablet TAKE 1 TABLET EACH DAY. 10/04/19  Yes Bensimhon, Shaune Pascal, MD  ibuprofen (ADVIL,MOTRIN) 200 MG tablet Take 800 mg by mouth every 6 (six) hours as needed for moderate pain.   Yes [provider]  lactulose (CHRONULAC) 10 GM/15ML solution Take 15 mLs (10 g total) by mouth 2 (two) times daily as needed for mild constipation. 01/09/20  Yes [name], MD  lisinopril (PRINIVIL,ZESTRIL) 5 MG tablet Take 5 mg by mouth daily.  07/20/18  Yes [provider]  loperamide (IMODIUM A-D) 2 MG tablet Take 1.5 tablets (3 mg total) by mouth 4 (four) times daily as needed for diarrhea or loose stools. 11/21/19  Yes [name], MD  losartan (COZAAR) 25 MG tablet Take 1 tablet (25 mg total) by mouth daily. 06/07/19  Yes Bensimhon, Shaune Pascal, MD  methadone (METHADOSE) 40 MG disintegrating tablet Take 200 mg by mouth daily.   Yes [provider]  methocarbamol (ROBAXIN) 500 MG tablet Take 1.5 tablets (750 mg total) by mouth every 8 (eight) hours as needed for muscle spasms. 08/11/19  Yes [name], MD  methocarbamol (ROBAXIN) 750 MG tablet TAKE 1 OR 2 TABLETS THREE TIMES A DAY AS NEEDED. 10/30/19  Yes [name], MD  naproxen (NAPROSYN) 500 MG tablet Take 1 tablet (500 mg total) by mouth 2 (two) times daily as needed for moderate pain. 01/09/20  Yes [name], MD    nitroGLYCERIN (NITROLINGUAL) 0.4 MG/SPRAY spray Place 2 sprays under the tongue every 5 (five) minutes x 3 doses as needed for chest pain.  03/20/15  Yes [provider]  ondansetron (ZOFRAN-ODT) 8 MG disintegrating tablet Take 1 tablet (8 mg total) by mouth every 8 (eight) hours as needed for nausea or vomiting. 01/09/20  Yes [name], MD  oxymetazoline (AFRIN) 0.05 % nasal spray Place 1 spray into both nostrils 2 (two) times daily.   Yes [provider]  pantoprazole (PROTONIX) 40 MG tablet Take 1 tablet (40 mg total) by mouth daily. 05/25/19  Yes [name], MD  polyethylene glycol powder (MIRALAX) 17 GM/SCOOP powder Take 17 g by mouth 2 (two) times daily as needed for mild constipation.   Yes [provider]  rosuvastatin (CRESTOR) 40 MG tablet Take 1 tablet (40 mg total) by mouth every evening. 04/12/18  Yes Bensimhon, Shaune Pascal, MD  sildenafil (REVATIO) 20 MG tablet Take 5 tablets (100 mg total) by mouth as needed. 01/09/20  Yes [name], MD  spironolactone (ALDACTONE) 25 MG tablet TAKE (1/2) TABLET DAILY. 10/04/19  Yes Bensimhon, Shaune Pascal, MD  sucralfate (CARAFATE) 1 g tablet Take 1 tablet (1 g total) by mouth 4 (four) times daily -  with meals and at bedtime. 03/14/18  Yes [place], Carley D, DO  tamsulosin (FLOMAX) 0.4 MG CAPS capsule Take 1 capsule (0.4 mg total) by mouth daily after breakfast. 11/21/19  Yes [name], MD  TESTOSTERONE CYPIONATE IM Inject into the muscle every 14 (fourteen) days.   Yes [provider]  torsemide (DEMADEX) 20 MG tablet Take 1 tablet (20 mg total) by mouth daily as needed. For swelling in legs 01/09/20  Yes [name], MD  traZODone (DESYREL) 100 MG tablet Take 1 tablet (100 mg total) by mouth at bedtime. 01/09/20  Yes [name], MD  triamcinolone (NASACORT AQ) 55 MCG/ACT AERO nasal inhaler Place 1 spray into the nose daily. 04/24/16  Yes Chesley Mires, MD  Vitamin D, Ergocalciferol, (DRISDOL)  1.25 MG (50000 UNIT) CAPS capsule Take 1 capsule (50,000 Units total) by mouth every 7 (seven) days. 12/04/19  Yes [name], MD  zolpidem (AMBIEN) 10 MG tablet Take 10 mg by mouth at bedtime.  07/11/18  Yes [provider]    Past Medical History:  Diagnosis Date  . Anxiety   . Automatic implantable cardiac defibrillator [employer] Judes    Analyze ST  . Benign prostatic hypertrophy   . Benzodiazepine dependence (HCC)    chronic  . CAD (coronary artery disease)    Last cath 2/12. 3-v CAD. Failed PCI of distal RCAc  CATH [facility] 4/13 with DES to  LAD X2  . CHF (congestive heart failure) ([place])   . Chronic back pain    lumbar stenosis  . Chronic systolic heart failure (HCC)    EF 20-25%. s/p ST. Jude ICD  . Depression   . DJD (degenerative joint disease)   . History of testicular cancer   . Ischemic cardiomyopathy   . Myocardial infarction ([place])   . Narcotic dependence (HCC)    chronic  . OSA on CPAP   . Polycythemia, secondary    S/p hematology consultation for polycythema on 01/06/18.  S/p bone marrow biopsy on 12/31/17 that was negative for myeloproliferative disorder.  Feels secondary to sleep apnea, active smoking, testosterone supplementation, nocturnal hypoxia  . TIA (transient ischemic attack)   . Vitamin B12 deficiency 07/22/2016    Past Surgical History:  Procedure Laterality Date  . ASD REPAIR, SINUS VENOSUS    . CARDIAC DEFIBRILLATOR PLACEMENT  08/2010  . HERNIA REPAIR    . LEFT HEART CATHETERIZATION WITH CORONARY ANGIOGRAM N/A 06/14/2014   Procedure: LEFT HEART CATHETERIZATION WITH CORONARY ANGIOGRAM;  Surgeon: Jolaine Artist, MD;  [facility]: [facility] CATH LAB;  Service: Cardiovascular;  Laterality: N/A;  . TESTICLE SURGERY     testicular cancer surgery left testicle removed    Social History   Tobacco Use  . Smoking status: Current Every Day Smoker    Packs/day: 0.50    Years: 29.00    Pack years: 14.50    Types: Cigarettes, E-cigarettes    Last attempt to  quit: 07/14/2015    Years since quitting: 4.4  . Smokeless  tobacco: Never Used  . Tobacco comment: no cigarette in 4 days but yes to e-cig  Substance Use Topics  . Alcohol use: No    Alcohol/week: 0.0 standard drinks    Family History  Problem Relation Age of Onset  . Heart disease Father 18       Died of MI  . Cancer Mother     ROS Per hpi  OBJECTIVE:  RKYHC'[id number]   01/09/20 1436  BP: 135/90  Pulse: 96  Temp: (!) 97.5 F (36.4 C)  SpO2: 97%  Height: 6' (1.829 m)   Body mass index is 26.34 kg/m.   Physical Exam Vitals and nursing note reviewed.  Constitutional:      Appearance: He is well-developed.  HENT:     Head: Normocephalic and atraumatic.  Eyes:     Conjunctiva/sclera: Conjunctivae normal.     Pupils: Pupils are equal, round, and reactive to light.  Cardiovascular:     Rate and Rhythm: Normal rate and regular rhythm.     Heart sounds: No murmur heard.  No friction rub. No gallop.   Pulmonary:     Effort: Pulmonary effort is normal.     Breath sounds: Normal breath sounds. No wheezing or rales.  Musculoskeletal:     Cervical back: Neck supple.  Skin:    General: Skin is warm and dry.  Neurological:     Mental Status: He is alert and oriented to person, place, and time. Mental status is at baseline.     Cranial Nerves: Cranial nerves are intact.     Sensory: Sensory deficit present.     Motor: Weakness and atrophy present. No tremor or abnormal muscle tone.     Gait: Gait abnormal.     Deep Tendon Reflexes: Reflexes abnormal.     No results found. However, due to the size of the patient record, not all encounters were searched. Please check Results Review for a complete set of results.  No results found.   ASSESSMENT and PLAN  1. Neurogenic claudication due to lumbar spinal stenosis 2. Impaired mobility and activities of daily living 3. Muscle weakness of lower extremity 4. Frequent falls Patient is not surgical candidate, goals to  improve safety and QOL with appropriate assisted devices and changes to home as deemed necessary by [name]. He does have full time care givers. On methadone thru clinic due to high doses.  - Ambulatory referral to Home Health  5. Urinary retention Most likely neurogenic, however BPH could be contributing. Referring to urology for further eval and treatment.  - Ambulatory referral to Urology  6. Colon cancer screening Referring to Trevor [name] as requested - Ambulatory referral to Gastroenterology  7. Drug-induced constipation Stable with lactulose prn - lactulose (CHRONULAC) 10 GM/15ML solution; Take 15 mLs (10 g total) by mouth 2 (two) times daily as needed for mild constipation.  8. Chronic nausea Stable with daily zofran use. Most likely from gastroparesis per EGD in 2006.  - ondansetron (ZOFRAN-ODT) 8 MG disintegrating tablet; Take 1 tablet (8 mg total) by mouth every 8 (eight) hours as needed for nausea or vomiting.   9. Erectile dysfunction, unspecified erectile dysfunction type - sildenafil (REVATIO) 20 MG tablet; Take 5 tablets (100 mg total) by mouth as needed.  Other orders .  Return in about 3 months (around 04/10/2020).    [name], MD Primary Care at [place], [place] [address] Ph.  [phone] Fax [phone]

## 2020-01-09 NOTE — Patient Instructions (Signed)
° ° ° °  If you have lab work done today you will be contacted with your lab results within the next 2 weeks.  If you have not heard from us then please contact us. The fastest way to get your results is to register for My Chart. ° ° °IF you received an x-ray today, you will receive an invoice from [facility]. Please contact [facility] at [phone] with questions or concerns regarding your invoice.  ° °IF you received labwork today, you will receive an invoice from [facility]. Please contact [facility] at [phone] with questions or concerns regarding your invoice.  ° °Our billing staff will not be able to assist you with questions regarding bills from these companies. ° °You will be contacted with the lab results as soon as they are available. The fastest way to get your results is to activate your My Chart account. Instructions are located on the last page of this paperwork. If you have not heard from us regarding the results in 2 weeks, please contact this office. °  ° ° ° °

## 2020-01-10 ENCOUNTER — Encounter: Payer: Self-pay | Admitting: Family Medicine

## 2020-01-17 ENCOUNTER — Ambulatory Visit (HOSPITAL_COMMUNITY)
Admission: RE | Admit: 2020-01-17 | Discharge: 2020-01-17 | Disposition: A | Discharge status: Home / Self Care | Payer: Self-pay | Source: Ambulatory Visit | Attending: Internal Medicine | Admitting: Internal Medicine

## 2020-01-17 DIAGNOSIS — I5022 Chronic systolic (congestive) heart failure: Secondary | ICD-10-CM

## 2020-01-17 DIAGNOSIS — I2510 Atherosclerotic heart disease of native coronary artery without angina pectoris: Secondary | ICD-10-CM

## 2020-01-17 NOTE — Addendum Note (Signed)
Encounter addended by: Scarlette Calico, RN on: 01/17/2020 2:22 PM  Actions taken: Order list changed, Diagnosis association updated, Clinical Note Signed

## 2020-01-17 NOTE — Progress Notes (Signed)
Heart Failure TeleHealth Note  Due to national recommendations of social distancing due to [place] 19, Audio/video telehealth visit is felt to be most appropriate for this patient at this time.  See MyChart message from today for patient consent regarding telehealth for [facility]. The patient was identified personally using two identifiers.   Date:  01/17/2020   ID:  [name], DOB November 03, 1960, MRN [id number]  Location: Home  Provider location: [facility] Type of Visit: Established patient  PCP:  [name], MD  Cardiologist:  No primary care provider on file. 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 Jude  ICD implantation. Was enrolled in [employer].   Has been followed recently by Dr. Stann Mainland at [facility]. Underwent cath at [facility] in 3/15 and had 2.5x72m Xience DES placed in LAD. Had cath 12/15 here with stable CAD. Repeat cath set up in 10/18 but he cancelled.   Echo 8/19; EF 30-35% (stable) mild RV dilation.  Echo 07/18/19 EF 40-45% mild MR  He presents via audio/video conferencing for an acute telehealth visit today due to recurrent CP.    He is on the phone today. He is slurring his words and sounds intoxicated. He is on the phone today. He is slurring his words and sounds intoxicated. Says he has been under a lot of stress recently due to his ex-wife. Began to have LE edema about a week. Has restarted torsemide and been taking 20 bid. Doesn't know what he weight. Also having CP and taking NTG. Has wandering thought process. Also talking his wife who gave him chlamydia and his back pain. Says edema is improved.      [name] symptoms worrisome for COVID 19.   Past Medical History:  Diagnosis Date  . Anxiety   . Automatic implantable cardiac defibrillator St Judes    Analyze ST  . Benign prostatic hypertrophy   . Benzodiazepine dependence (HCC)    chronic  . CAD (coronary artery disease)    Last cath 2/12. 3-v CAD. Failed PCI of distal RCAc  CATH [facility] 4/13 with DES to  LAD X2  . CHF ([place]estive heart failure) ([place]   . Chronic back pain    lumbar stenosis  . Chronic systolic heart failure (HCC)    EF 20-25%. s/p ST. Jude ICD  . Depression   . DJD (degenerative joint disease)   . History of testicular cancer   . Ischemic cardiomyopathy   . Myocardial infarction ([place]   . Narcotic dependence (HCC)    chronic  . OSA on CPAP   . Polycythemia, secondary    S/p hematology consultation for polycythema on 01/06/18.  S/p bone marrow biopsy on 12/31/17 that was negative for myeloproliferative disorder.  Feels secondary to sleep apnea, active smoking, testosterone supplementation, nocturnal hypoxia  . TIA (transient ischemic attack)   . Vitamin B12 deficiency 07/22/2016   Past Surgical History:  Procedure Laterality Date  . ASD REPAIR, SINUS VENOSUS    . CARDIAC DEFIBRILLATOR PLACEMENT  08/2010  . HERNIA REPAIR    . LEFT HEART CATHETERIZATION WITH CORONARY ANGIOGRAM N/A 06/14/2014   Procedure: LEFT HEART CATHETERIZATION  WITH CORONARY ANGIOGRAM;  Surgeon: Jolaine Artist, MD;  Location: [facility] CATH LAB;  Service: Cardiovascular;  Laterality: N/A;  . TESTICLE SURGERY     testicular cancer surgery left testicle removed     Current Outpatient Medications  Medication Sig Dispense Refill  . albuterol (VENTOLIN HFA) 108 (90 Base) MCG/ACT inhaler Inhale 2 puffs into the lungs every 6 (six) hours as needed for wheezing or shortness of breath. 18 g 5  . ALPRAZolam (XANAX) 1 MG tablet Take 1 mg by mouth 4 (four) times daily.     . carvedilol (COREG) 25 MG tablet Take 25 mg by mouth 2 (two)  times daily with a meal.    . clonazePAM (KLONOPIN) 1 MG tablet Take 1 mg by mouth 3 (three) times daily as needed.    . clopidogrel (PLAVIX) 75 MG tablet Take 1 tablet (75 mg total) by mouth daily. Pt needs to schedule an appt with our office at [phone] for future refills. 30 tablet 2  . cyanocobalamin (,VITAMIN B-12,) 1000 MCG/ML injection Inject 1 mL (1,000 mcg total) into the muscle every 30 (thirty) days. 1 mL 11  . dapagliflozin propanediol (FARXIGA) 10 MG TABS tablet Take 10 mg by mouth daily before breakfast. 30 tablet 6  . diazepam (VALIUM) 10 MG tablet Take 20 mg by mouth every 12 (twelve) hours as needed for anxiety.   2  . erythromycin ophthalmic ointment Place 1 application into the right eye at bedtime. 3.5 g 1  . ezetimibe (ZETIA) 10 MG tablet TAKE 1 TABLET EACH DAY. 90 tablet 1  . ibuprofen (ADVIL,MOTRIN) 200 MG tablet Take 800 mg by mouth every 6 (six) hours as needed for moderate pain.    Marland Kitchen lactulose (CHRONULAC) 10 GM/15ML solution Take 15 mLs (10 g total) by mouth 2 (two) times daily as needed for mild constipation. 1892 mL 0  . lisinopril (PRINIVIL,ZESTRIL) 5 MG tablet Take 5 mg by mouth daily.     Marland Kitchen loperamide (IMODIUM A-D) 2 MG tablet Take 1.5 tablets (3 mg total) by mouth 4 (four) times daily as needed for diarrhea or loose stools. 30 tablet 2  . losartan (COZAAR) 25 MG tablet Take 1 tablet (25 mg total) by mouth daily. 90 tablet 3  . methadone (METHADOSE) 40 MG disintegrating tablet Take 200 mg by mouth daily.    . methocarbamol (ROBAXIN) 500 MG tablet Take 1.5 tablets (750 mg total) by mouth every 8 (eight) hours as needed for muscle spasms. 90 tablet 1  . methocarbamol (ROBAXIN) 750 MG tablet TAKE 1 OR 2 TABLETS THREE TIMES A DAY AS NEEDED. 180 tablet 5  . naproxen (NAPROSYN) 500 MG tablet Take 1 tablet (500 mg total) by mouth 2 (two) times daily as needed for moderate pain. 60 tablet 2  . nitroGLYCERIN (NITROLINGUAL) 0.4 MG/SPRAY spray Place 2 sprays under the tongue  every 5 (five) minutes x 3 doses as needed for chest pain.     Marland Kitchen ondansetron (ZOFRAN-ODT) 8 MG disintegrating tablet Take 1 tablet (8 mg total) by mouth every 8 (eight) hours as needed for nausea or vomiting. 90 tablet 1  . oxymetazoline (AFRIN) 0.05 % nasal spray Place 1 spray into both nostrils 2 (two) times daily.    . pantoprazole (PROTONIX) 40 MG tablet Take 1 tablet (40 mg total) by mouth daily. 90 tablet 1  . polyethylene glycol powder (MIRALAX) 17 GM/SCOOP powder Take 17 g by mouth 2 (two) times daily as needed for mild constipation.    Marland Kitchen  rosuvastatin (CRESTOR) 40 MG tablet Take 1 tablet (40 mg total) by mouth every evening. 30 tablet 2  . sildenafil (REVATIO) 20 MG tablet Take 5 tablets (100 mg total) by mouth as needed. 90 tablet 5  . spironolactone (ALDACTONE) 25 MG tablet TAKE (1/2) TABLET DAILY. 45 tablet 1  . sucralfate (CARAFATE) 1 g tablet Take 1 tablet (1 g total) by mouth 4 (four) times daily -  with meals and at bedtime. 120 tablet 0  . tamsulosin (FLOMAX) 0.4 MG CAPS capsule Take 1 capsule (0.4 mg total) by mouth daily after breakfast. 90 capsule 0  . TESTOSTERONE CYPIONATE IM Inject into the muscle every 14 (fourteen) days.    Marland Kitchen torsemide (DEMADEX) 20 MG tablet Take 1 tablet (20 mg total) by mouth daily as needed. For swelling in legs 90 tablet 2  . traZODone (DESYREL) 100 MG tablet Take 1 tablet (100 mg total) by mouth at bedtime. 90 tablet 0  . triamcinolone (NASACORT AQ) 55 MCG/ACT AERO nasal inhaler Place 1 spray into the nose daily. 1 Inhaler 12  . Vitamin D, Ergocalciferol, (DRISDOL) 1.25 MG (50000 UNIT) CAPS capsule Take 1 capsule (50,000 Units total) by mouth every 7 (seven) days. 12 capsule 1  . zolpidem (AMBIEN) 10 MG tablet Take 10 mg by mouth at bedtime.      No current facility-administered medications for this encounter.    Allergies:   Darvocet [propoxyphene n-acetaminophen]   Social History:  The patient  reports that he has been smoking cigarettes and  e-cigarettes. He has a 14.50 pack-year smoking history. He has never used smokeless tobacco. He reports current drug use. Drug: Marijuana. He reports that he does not drink alcohol.   Family History:  The patient's family history includes Cancer in his mother; Heart disease (age of onset: 42) in his father.   ROS:  Please see the history of present illness.   All other systems are personally reviewed and negative.   Exam:  (Video/Tele Health Call; Exam is subjective and or/visual.) General:  Slurring speech No resp difficulty.  Lungs: Normal respiratory effort with conversation.  Abdomen: Non-distended per patient report Extremities: Pt denies any further edema Neuro: Alert & oriented x 3.   Recent Labs: 11/21/2019: ALT 9; BUN 14; Creatinine, Ser 0.94; Hemoglobin 17.7; Magnesium 1.9; Platelets 143; Potassium 4.0; Sodium 124; TSH 0.929  Personally reviewed   Wt Readings from Last 3 Encounters:  12/08/19 88.1 kg (194 lb 3.2 oz)  12/07/19 93 kg (205 lb)  08/14/19 91.9 kg (202 lb 9.6 oz)      ASSESSMENT AND PLAN:  1. Chronic systolic HF - Echo 1/30 EF 30-35%. S/p STJ ICD - Echo 07/18/19 EF 40-45% - NYHA III from cardiac perspective.  - Continue losartan 25 and Farxiga 10  - Continue carvedilol and spiro.  - Volume status worse recently and now improved with torsemide - Continue torsemide prn - Check labs (willliekly need potassium supplementation) & repeat echo 2. CAD with ischemic CM - Stable by cath 2015 - having more CP recently but this is not uncommon pattern for him. Suspect stress related. Will repeat echo. If symptoms persist will plan repeat cath - Continue DAPT, statin and b-blocker 3. Polycythemia  - Followed by Dr. Annabelle Harman at [facility] and felt to be due to chronic hypoxia from OSA.  - Seen by Dr. Alen Blew who agreed 4. Chronic respiratory failure - now off O2 5. Tobacco use - continues to smoke a few cigs/day. No change 6. Spinal stenosis -  this is severe. - follows  with [facility]   COVID screen The patient does not have any symptoms that suggest any further testing/ screening at this time.  Social distancing reinforced today.  Recommended follow-up:  As above  Relevant cardiac medications were reviewed at length with the patient today.   The patient does not have concerns regarding their medications at this time.   The following changes were made today:  As above  Today, I have spent 16 minutes with the patient with telehealth technology discussing the above issues .    Signed, Glori Bickers, MD  01/17/2020 1:10 PM  [facility] [place] [address] [phone] (office) [phone] (fax)

## 2020-01-17 NOTE — Addendum Note (Signed)
Encounter addended by: Scarlette Calico, RN on: 01/17/2020 3:21 PM  Actions taken: Order list changed, Diagnosis association updated

## 2020-01-17 NOTE — Progress Notes (Signed)
Called pt and sch lab appt and echo for Fri 7/9, he is aware and thankful

## 2020-01-18 ENCOUNTER — Ambulatory Visit: Payer: Self-pay | Admitting: Family Medicine

## 2020-01-18 ENCOUNTER — Telehealth: Payer: Self-pay | Admitting: Family Medicine

## 2020-01-18 NOTE — Telephone Encounter (Signed)
Called pt per CRM /pt called to cancel his appt for today. Returned pts call unable to leave message . Mail box full

## 2020-01-19 ENCOUNTER — Other Ambulatory Visit (HOSPITAL_COMMUNITY): Payer: Self-pay

## 2020-01-19 ENCOUNTER — Ambulatory Visit (HOSPITAL_COMMUNITY): Payer: Self-pay

## 2020-01-22 ENCOUNTER — Ambulatory Visit: Payer: Self-pay | Admitting: Family Medicine

## 2020-01-22 ENCOUNTER — Other Ambulatory Visit (HOSPITAL_COMMUNITY): Payer: Self-pay

## 2020-01-22 ENCOUNTER — Ambulatory Visit (HOSPITAL_COMMUNITY): Admission: RE | Admit: 2020-01-22 | Payer: Self-pay | Source: Ambulatory Visit
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
## 2020-01-22 NOTE — Telephone Encounter (Signed)
Called and spoke to pt scheduled an appointment with Dr Linna Darner will forward these notes to him for an [facility]

## 2020-01-25 ENCOUNTER — Other Ambulatory Visit: Payer: Self-pay

## 2020-01-25 ENCOUNTER — Ambulatory Visit (INDEPENDENT_AMBULATORY_CARE_PROVIDER_SITE_OTHER): Payer: Self-pay | Admitting: Family Medicine

## 2020-01-25 ENCOUNTER — Other Ambulatory Visit (HOSPITAL_COMMUNITY)
Admission: RE | Admit: 2020-01-25 | Discharge: 2020-01-25 | Disposition: A | Discharge status: Home / Self Care | Payer: Self-pay | Source: Ambulatory Visit | Attending: Family Medicine | Admitting: Family Medicine

## 2020-01-25 ENCOUNTER — Encounter: Payer: Self-pay | Admitting: Family Medicine

## 2020-01-25 VITALS — BP 121/75 | HR 86 | Temp 97.9°F | Ht 70.0 in | Wt 161.0 lb

## 2020-01-25 DIAGNOSIS — N451 Epididymitis: Secondary | ICD-10-CM

## 2020-01-25 DIAGNOSIS — I272 Pulmonary hypertension, unspecified: Secondary | ICD-10-CM

## 2020-01-25 DIAGNOSIS — K5903 Drug induced constipation: Secondary | ICD-10-CM

## 2020-01-25 DIAGNOSIS — N50811 Right testicular pain: Secondary | ICD-10-CM

## 2020-01-25 DIAGNOSIS — Z113 Encounter for screening for infections with a predominantly sexual mode of transmission: Secondary | ICD-10-CM | POA: Insufficient documentation

## 2020-01-25 LAB — POCT URINALYSIS DIP (MANUAL ENTRY)
Blood, UA: NEGATIVE
Glucose, UA: 250 mg/dL — AB
Leukocytes, UA: NEGATIVE
Nitrite, UA: NEGATIVE
Protein Ur, POC: NEGATIVE mg/dL
Spec Grav, UA: 1.025 (ref 1.010–1.025)
Urobilinogen, UA: 2 E.U./dL — AB
pH, UA: 6 (ref 5.0–8.0)

## 2020-01-25 MED ORDER — LACTULOSE 10 GM/15ML PO SOLN: 10.0000 g | Freq: Two times a day (BID) | ORAL | 0 refills | Status: DC | PRN

## 2020-01-25 MED ORDER — SILDENAFIL CITRATE 20 MG PO TABS: 100.0000 mg | ORAL_TABLET | ORAL | 0 refills | Status: DC | PRN

## 2020-01-25 MED ORDER — DOXYCYCLINE HYCLATE 100 MG PO CAPS: 100.0000 mg | ORAL_CAPSULE | Freq: Two times a day (BID) | ORAL | 0 refills | Status: DC

## 2020-01-25 MED ORDER — DICLOFENAC SODIUM 75 MG PO TBEC: 75.0000 mg | DELAYED_RELEASE_TABLET | Freq: Two times a day (BID) | ORAL | 0 refills | Status: DC

## 2020-01-25 MED ORDER — SPIRONOLACTONE 25 MG PO TABS: ORAL_TABLET | ORAL | 1 refills | Status: DC

## 2020-01-25 NOTE — Patient Instructions (Addendum)
  Take doxycycline 100 milligrams 1 twice daily for 1 week.  Take with food.  Complete the full week course.  Take Voltaren (diclofenac) 75 mg twice daily for 1 week also for pain and inflammation.  This you can take as needed if you are not having the symptoms.  I have also refilled your lactulose and sildenafil per your request.  If symptoms persist you can see the urologist, or we can refer you to them if needed.  I do not see the need for doing the cancer marker tests at this time.  Let them decide on that.     If you have lab work done today you will be contacted with your lab results within the next 2 weeks.  If you have not heard from [place] then please contact us. The fastest way to get your results is to register for My Chart.   IF you received an x-ray today, you will receive an invoice from [facility]. Please contact [facility] at [phone] with questions or concerns regarding your invoice.   IF you received labwork today, you will receive an invoice from [facility]. Please contact [facility] at [phone] with questions or concerns regarding your invoice.   Our billing staff will not be able to assist you with questions regarding bills from these companies.  You will be contacted with the lab results as soon as they are available. The fastest way to get your results is to activate your My Chart account. Instructions are located on the last page of this paperwork. If you have not heard from [place] regarding the results in 2 weeks, please contact this office.
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
## 2020-01-26 LAB — GC/CHLAMYDIA PROBE AMP (~~LOC~~) NOT AT ARMC
Chlamydia: NEGATIVE
Comment: NEGATIVE
Comment: NORMAL
Neisseria Gonorrhea: NEGATIVE

## 2020-01-26 LAB — RPR: RPR Ser Ql: NONREACTIVE

## 2020-01-26 LAB — HIV ANTIBODY (ROUTINE TESTING W REFLEX): HIV Screen 4th Generation wRfx: NONREACTIVE

## 2020-01-26 NOTE — Progress Notes (Signed)
Call:  STD Lab testing was all negative.  If you persist with concerns see your urologist as discussed.  Fenton Malling. Linna Darner MD

## 2020-01-26 NOTE — Telephone Encounter (Signed)
Patient's concern/request has been addressed already. Saw onc in may 2021 - no concerns for hematological malignancy Patient has pending gi referral for colonoscopy.

## 2020-01-26 NOTE — Addendum Note (Signed)
Addended by: Norton Blizzard R on: 12/08/4130 04:54 PM   Modules accepted: Orders

## 2020-01-26 NOTE — Telephone Encounter (Signed)
Pt returning a called please call pt back for his test result.thanks Advice

## 2020-01-26 NOTE — Telephone Encounter (Signed)
Patient has been informed of recent lab result notes and recommendations. He is requesting a referral to oncology and voices concerns for weight loss in the last few weeks.

## 2020-01-29 ENCOUNTER — Telehealth: Payer: Self-pay | Admitting: Family Medicine

## 2020-01-29 NOTE — Telephone Encounter (Signed)
Pt called and is needing another referral to the cancer center. Pt would like a call regarding this matter. Please advise.

## 2020-01-29 NOTE — Telephone Encounter (Signed)
LVM for a return call for details of additional request for oncology referral . Last one was placed on 01/26/20.

## 2020-01-31 NOTE — Telephone Encounter (Signed)
Pt did call back and they were supposed to be transferred to me. It was transferred but no one was on the line. I have stayed on the like for 3-4 min seeing if anyone would respond. No success.

## 2020-01-31 NOTE — Telephone Encounter (Signed)
I have spoken to someone with Oncology and they stated that pt is an established pt. Pt was already seen by them.   According to Dr. Pamella Pert on 01/26/20.  "Patient's concern/request has been addressed already. Saw onc in may 2021 - no concerns for hematological malignancy Patient has pending gi referral for colonoscopy."  I have stated understanding.

## 2020-01-31 NOTE — Telephone Encounter (Signed)
I have attempted to call pt no answer. Left a message for them to call back.

## 2020-01-31 NOTE — Telephone Encounter (Signed)
I have called pt back and no answer. He then called me back. I have relayed the message about the referral that was sent and he stated understanding.

## 2020-02-01 ENCOUNTER — Telehealth: Payer: Self-pay | Admitting: Oncology

## 2020-02-01 NOTE — Telephone Encounter (Signed)
Scheduled provider visit on 08/03 per patient's request, provider approved of date and time.

## 2020-02-07 ENCOUNTER — Telehealth: Payer: Self-pay | Admitting: Internal Medicine

## 2020-02-07 NOTE — Telephone Encounter (Signed)
Called for concerns for increased fluid in legs.  Patient notes that he had significant leg edema that, on top of his chronic back pain, makes him unable to walk comfortably.  Notes that he would like [place] to consider increasing his torsemide dose.  Patient notes that he has received his lab work from 01/17/20.  Unable to view it in Epic; though patient notes he can see it in [facility].  Cautioned patient against TID administration (his plan for diuretics) until after getting his lab work.  Reasonable to take PM dose.  Patient incidentally notes chronic burning urintation.  Patient requests in person outpatient visit if possible, and is amenable to getting lab work redone if needed.  No further questions; greater than 15 minutes spent with patient care/education.  Rudean Haskell MD

## 2020-02-08 ENCOUNTER — Telehealth (HOSPITAL_COMMUNITY): Payer: Self-pay | Admitting: *Deleted

## 2020-02-08 NOTE — Telephone Encounter (Signed)
I am unable to see him urgently as I will be out of town.   If he needs emergent visit should got to ER or see PCP.

## 2020-02-08 NOTE — Telephone Encounter (Signed)
Pts friend called stating pt wanted an emergency appointment today Pt called the on call line last night (please see note below). Pt has swelling in feet and ankles. Pt took demadex yesterday and then another last night as advised by on call doctor but he said he is not urinating. Pt also stated he lost 40lbs in 1 week.  Pts current weight 163lbs. Pt said he has mild chest pain and shortness of breath. Pt said he tried for 3 hours to urinate this AM but was unsuccessful.  Pt asked that I report this directly to [facility].  Called for concerns for increased fluid in legs.  Patient notes that he had significant leg edema that, on top of his chronic back pain, makes him unable to walk comfortably.  Notes that he would like [place] to consider increasing his torsemide dose.  Patient notes that he has received his lab work from 01/17/20.  Unable to view it in Epic; though patient notes he can see it in [facility].  Cautioned patient against TID administration (his plan for diuretics) until after getting his lab work.  Reasonable to take PM dose.  Patient incidentally notes chronic burning urintation.  Patient requests in person outpatient visit if possible, and is amenable to getting lab work redone if needed.  No further questions; greater than 15 minutes spent with patient care/education.  Rudean Haskell MD        Electronically signed by Werner Lean, MD at 02/07/2020 9:54 PM

## 2020-02-09 ENCOUNTER — Encounter (HOSPITAL_COMMUNITY): Payer: Self-pay | Admitting: *Deleted

## 2020-02-09 NOTE — Telephone Encounter (Signed)
Advice sent via mychart °

## 2020-02-13 ENCOUNTER — Inpatient Hospital Stay: Payer: Self-pay | Attending: Oncology | Admitting: Oncology

## 2020-02-13 ENCOUNTER — Other Ambulatory Visit: Payer: Self-pay

## 2020-02-13 VITALS — BP 117/71 | HR 73 | Temp 98.1°F | Resp 18 | Ht 70.0 in | Wt 194.3 lb

## 2020-02-13 DIAGNOSIS — Z87891 Personal history of nicotine dependence: Secondary | ICD-10-CM | POA: Insufficient documentation

## 2020-02-13 DIAGNOSIS — Z79899 Other long term (current) drug therapy: Secondary | ICD-10-CM | POA: Insufficient documentation

## 2020-02-13 DIAGNOSIS — D751 Secondary polycythemia: Secondary | ICD-10-CM | POA: Insufficient documentation

## 2020-02-13 DIAGNOSIS — F4322 Adjustment disorder with anxiety: Secondary | ICD-10-CM | POA: Diagnosis not present

## 2020-02-13 DIAGNOSIS — Z7984 Long term (current) use of oral hypoglycemic drugs: Secondary | ICD-10-CM | POA: Insufficient documentation

## 2020-02-13 NOTE — Progress Notes (Signed)
Hematology and Oncology Follow Up Visit  [name] [id number] 1961-02-11 59 y.o. 02/13/2020 3:45 PM [name], MDSantiago, [name], MD   Principle Diagnosis: 59 year old man with polycythemia diagnosed in January 2019.  Etiology is related to secondary causes and has resolved at this time.  Malignancy and myeloproliferative disorder is considered unlikely.  He has completed extensive work-up including bone marrow biopsy in June 2019.   Current therapy: No treatment needed.  Interim History: [name] presents today for a repeat evaluation.  Since the last visit, he reports no major changes in his health.  He is concerned about weight loss and have noted fluctuation in his weight of 30 to 40 pounds periodically.  His weight is 194 pounds which he reports is his usual baseline.  He denies any recent hospitalization or illnesses.  He does not recall his last visit here which was in May 2021.  Medications: Unchanged on review. Current Outpatient Medications  Medication Sig Dispense Refill   albuterol (VENTOLIN HFA) 108 (90 Base) MCG/ACT inhaler Inhale 2 puffs into the lungs every 6 (six) hours as needed for wheezing or shortness of breath. 18 g 5   ALPRAZolam (XANAX) 1 MG tablet Take 1 mg by mouth 4 (four) times daily.      carvedilol (COREG) 25 MG tablet Take 25 mg by mouth 2 (two) times daily with a meal.     clonazePAM (KLONOPIN) 1 MG tablet Take 1 mg by mouth 3 (three) times daily as needed.     clopidogrel (PLAVIX) 75 MG tablet Take 1 tablet (75 mg total) by mouth daily. Pt needs to schedule an appt with our office at [phone] for future refills. 30 tablet 2   cyanocobalamin (,VITAMIN B-12,) 1000 MCG/ML injection Inject 1 mL (1,000 mcg total) into the muscle every 30 (thirty) days. 1 mL 11   dapagliflozin propanediol (FARXIGA) 10 MG TABS tablet Take 10 mg by mouth daily before breakfast. 30 tablet 6   diazepam (VALIUM) 10 MG tablet Take 20 mg by mouth every 12 (twelve)  hours as needed for anxiety.   2   diclofenac (VOLTAREN) 75 MG EC tablet Take 1 tablet (75 mg total) by mouth 2 (two) times daily. 20 tablet 0   doxycycline (VIBRAMYCIN) 100 MG capsule Take 1 capsule (100 mg total) by mouth 2 (two) times daily. 14 capsule 0   erythromycin ophthalmic ointment Place 1 application into the right eye at bedtime. 3.5 g 1   ezetimibe (ZETIA) 10 MG tablet TAKE 1 TABLET EACH DAY. 90 tablet 1   ibuprofen (ADVIL,MOTRIN) 200 MG tablet Take 800 mg by mouth every 6 (six) hours as needed for moderate pain.     lactulose (CHRONULAC) 10 GM/15ML solution Take 15 mLs (10 g total) by mouth 2 (two) times daily as needed for mild constipation. 1892 mL 0   lisinopril (PRINIVIL,ZESTRIL) 5 MG tablet Take 5 mg by mouth daily.      loperamide (IMODIUM A-D) 2 MG tablet Take 1.5 tablets (3 mg total) by mouth 4 (four) times daily as needed for diarrhea or loose stools. 30 tablet 2   losartan (COZAAR) 25 MG tablet Take 1 tablet (25 mg total) by mouth daily. 90 tablet 3   methadone (METHADOSE) 40 MG disintegrating tablet Take 200 mg by mouth daily.     methocarbamol (ROBAXIN) 500 MG tablet Take 1.5 tablets (750 mg total) by mouth every 8 (eight) hours as needed for muscle spasms. 90 tablet 1   methocarbamol (ROBAXIN)  750 MG tablet TAKE 1 OR 2 TABLETS THREE TIMES A DAY AS NEEDED. 180 tablet 5   naproxen (NAPROSYN) 500 MG tablet Take 1 tablet (500 mg total) by mouth 2 (two) times daily as needed for moderate pain. 60 tablet 2   nitroGLYCERIN (NITROLINGUAL) 0.4 MG/SPRAY spray Place 2 sprays under the tongue every 5 (five) minutes x 3 doses as needed for chest pain.      ondansetron (ZOFRAN-ODT) 8 MG disintegrating tablet Take 1 tablet (8 mg total) by mouth every 8 (eight) hours as needed for nausea or vomiting. 90 tablet 1   oxymetazoline (AFRIN) 0.05 % nasal spray Place 1 spray into both nostrils 2 (two) times daily.     pantoprazole (PROTONIX) 40 MG tablet Take 1 tablet (40 mg  total) by mouth daily. 90 tablet 1   polyethylene glycol powder (MIRALAX) 17 GM/SCOOP powder Take 17 g by mouth 2 (two) times daily as needed for mild constipation.     rosuvastatin (CRESTOR) 40 MG tablet Take 1 tablet (40 mg total) by mouth every evening. 30 tablet 2   sildenafil (REVATIO) 20 MG tablet Take 5 tablets (100 mg total) by mouth as needed. 90 tablet 0   spironolactone (ALDACTONE) 25 MG tablet TAKE (1/2) TABLET DAILY. 45 tablet 1   sucralfate (CARAFATE) 1 g tablet Take 1 tablet (1 g total) by mouth 4 (four) times daily -  with meals and at bedtime. 120 tablet 0   tamsulosin (FLOMAX) 0.4 MG CAPS capsule Take 1 capsule (0.4 mg total) by mouth daily after breakfast. 90 capsule 0   TESTOSTERONE CYPIONATE IM Inject into the muscle every 14 (fourteen) days.     torsemide (DEMADEX) 20 MG tablet Take 1 tablet (20 mg total) by mouth daily as needed. For swelling in legs 90 tablet 2   traZODone (DESYREL) 100 MG tablet Take 1 tablet (100 mg total) by mouth at bedtime. 90 tablet 0   triamcinolone (NASACORT AQ) 55 MCG/ACT AERO nasal inhaler Place 1 spray into the nose daily. 1 Inhaler 12   Vitamin D, Ergocalciferol, (DRISDOL) 1.25 MG (50000 UNIT) CAPS capsule Take 1 capsule (50,000 Units total) by mouth every 7 (seven) days. 12 capsule 1   zolpidem (AMBIEN) 10 MG tablet Take 10 mg by mouth at bedtime.      No current facility-administered medications for this visit.     Allergies:  Allergies  Allergen Reactions   Darvocet [Propoxyphene N-Acetaminophen] Anaphylaxis    Throat closes        Physical Exam:    ECOG: 2    General appearance: Alert, awake without any distress. Head: Atraumatic without abnormalities Oropharynx: Without any thrush or ulcers. Eyes: No scleral icterus. Lymph nodes: No lymphadenopathy noted in the cervical, supraclavicular, or axillary nodes Heart:regular rate and rhythm, without any murmurs or gallops.   Lung: Clear to auscultation without  any rhonchi, wheezes or dullness to percussion. Abdomin: Soft, nontender without any shifting dullness or ascites. Musculoskeletal: No clubbing or cyanosis. Neurological: No motor or sensory deficits. Skin: No rashes or lesions.       Lab Results: Lab Results  Component Value Date   WBC 4.5 11/21/2019   HGB 17.7 11/21/2019   HCT 50.4 11/21/2019   MCV 94 11/21/2019   PLT 143 (L) 11/21/2019     Chemistry      Component Value Date/Time   NA 124 (L) 11/21/2019 1126   K 4.0 11/21/2019 1126   CL 86 (L) 11/21/2019 1126   CO2  26 11/21/2019 1126   BUN 14 11/21/2019 1126   CREATININE 0.94 11/21/2019 1126   CREATININE 0.97 08/26/2017 1152   CREATININE 0.97 12/04/2014 1644      Component Value Date/Time   CALCIUM 9.5 11/21/2019 1126   ALKPHOS 118 (H) 11/21/2019 1126   AST 14 11/21/2019 1126   AST 14 08/26/2017 1152   ALT 9 11/21/2019 1126   ALT 10 08/26/2017 1152   BILITOT 0.3 11/21/2019 1126   BILITOT 0.7 08/26/2017 1152        Impression and Plan:  59 year old man with:   1.  Polycythemia noted since 2017 with work-up in 2019 did not reveal any hematological disorder or malignancy.  This is likely secondary to smoking history.  The natural course of this disease was reiterated this time.  Differential diagnosis was also reviewed in detail and given the fact that his work-up has been negative and laboratory testing recently showed normalization of his hemoglobin, no further testing or evaluation is needed.  2.  Weight loss and failure to thrive: This was discussed again in detail I see no evidence to suggest malignancy.  He has had a extensive work-up including imaging studies as well as laboratory data which shows no evidence to suggest any abnormalities at this time.  His weight today is identical to his weight in May 2021.  2.  Follow-up: No follow-up is needed at this time.  30  minutes were spent on this encounter.  The time was dedicated to reviewing laboratory  data, discussing differential diagnosis and recommendation for future management.      Zola Button, MD 8/3/20213:45 PM

## 2020-02-20 ENCOUNTER — Ambulatory Visit (INDEPENDENT_AMBULATORY_CARE_PROVIDER_SITE_OTHER): Payer: BC Managed Care – PPO | Admitting: Registered Nurse

## 2020-02-20 ENCOUNTER — Encounter: Payer: Self-pay | Admitting: Registered Nurse

## 2020-02-20 ENCOUNTER — Other Ambulatory Visit: Payer: Self-pay

## 2020-02-20 VITALS — BP 108/67 | HR 84 | Temp 98.5°F | Resp 18 | Ht 70.0 in | Wt 163.0 lb

## 2020-02-20 DIAGNOSIS — M7021 Olecranon bursitis, right elbow: Secondary | ICD-10-CM | POA: Diagnosis not present

## 2020-02-20 DIAGNOSIS — R634 Abnormal weight loss: Secondary | ICD-10-CM | POA: Diagnosis not present

## 2020-02-20 NOTE — Patient Instructions (Signed)
° ° ° °  If you have lab work done today you will be contacted with your lab results within the next 2 weeks.  If you have not heard from us then please contact us. The fastest way to get your results is to register for My Chart. ° ° °IF you received an x-ray today, you will receive an invoice from [facility]. Please contact [facility] at [phone] with questions or concerns regarding your invoice.  ° °IF you received labwork today, you will receive an invoice from [facility]. Please contact [facility] at [phone] with questions or concerns regarding your invoice.  ° °Our billing staff will not be able to assist you with questions regarding bills from these companies. ° °You will be contacted with the lab results as soon as they are available. The fastest way to get your results is to activate your My Chart account. Instructions are located on the last page of this paperwork. If you have not heard from us regarding the results in 2 weeks, please contact this office. °  ° ° ° °

## 2020-02-20 NOTE — Progress Notes (Signed)
Acute Office Visit  Subjective:    Patient ID: [name], male    DOB: [birth year], 60 y.o.   MRN: [id number]  Chief Complaint  Patient presents with  . Recurrent Skin Infections    patient states he has noticed a boil on his arm for about one week that is painful and feels like fluid.  . Weight Loss    patient states he was loss 31 pounds in one week was swollen with fluid all over.    HPI Patient is in today for traumatic olecranon bursitis and weight loss  Bursitis: r elbow swollen, painful to move, swelling is all at olecranon. Discrete fluid filled lesion. No breaks in skin, no warmth or redness, no evidence of septic joint.  Weight loss: reports 31 lb of weight loss in a week - states that he is being worked up by Dr. Pamella Pert for a potential onc dx - requesting some labs to be drawn today to start investigating potential causes. No further symptoms, no further insight as to his thoughts on an onc dx.  No other concerns today.  Past Medical History:  Diagnosis Date  . Anxiety   . Automatic implantable cardiac defibrillator [employer]    Analyze ST  . Benign prostatic hypertrophy   . Benzodiazepine dependence (HCC)    chronic  . CAD (coronary artery disease)    Last cath 2/12. 3-v CAD. Failed PCI of distal RCAc  CATH [facility] 4/13 with DES to  LAD X2  . CHF (congestive heart failure) ([place])   . Chronic back pain    lumbar stenosis  . Chronic systolic heart failure (HCC)    EF 20-25%. s/p ST. Jude ICD  . Depression   . DJD (degenerative joint disease)   . History of testicular cancer   . Ischemic cardiomyopathy   . Myocardial infarction ([place])   . Narcotic dependence (HCC)    chronic  . OSA on CPAP   . Polycythemia, secondary    S/p hematology consultation for polycythema on 01/06/18.  S/p bone marrow biopsy on 12/31/17 that was negative for myeloproliferative disorder.  Feels secondary to sleep apnea, active smoking, testosterone supplementation, nocturnal hypoxia  .  TIA (transient ischemic attack)   . Vitamin B12 deficiency 07/22/2016    Past Surgical History:  Procedure Laterality Date  . ASD REPAIR, SINUS VENOSUS    . CARDIAC DEFIBRILLATOR PLACEMENT  08/2010  . HERNIA REPAIR    . LEFT HEART CATHETERIZATION WITH CORONARY ANGIOGRAM N/A 06/14/2014   Procedure: LEFT HEART CATHETERIZATION WITH CORONARY ANGIOGRAM;  Surgeon: Jolaine Artist, MD;  Location: [facility] CATH LAB;  Service: Cardiovascular;  Laterality: N/A;  . TESTICLE SURGERY     testicular cancer surgery left testicle removed    Family History  Problem Relation Age of Onset  . Heart disease Father 74       Died of MI  . Cancer Mother     Social History   Socioeconomic History  . Marital status: Divorced    Spouse name: Not on file  . Number of children: 4  . Years of education: Not on file  . Highest education level: Not on file  Occupational History  . Not on file  Tobacco Use  . Smoking status: Current Every Day Smoker    Packs/day: 0.50    Years: 29.00    Pack years: 14.50    Types: Cigarettes, E-cigarettes    Last attempt to quit: 07/14/2015    Years since quitting:  4.6  . Smokeless tobacco: Never Used  . Tobacco comment: no cigarette in 4 days but yes to e-cig  Vaping Use  . Vaping Use: Every day  . Start date: 07/14/2015  Substance and Sexual Activity  . Alcohol use: No    Alcohol/week: 0.0 standard drinks  . Drug use: Yes    Types: Marijuana    Comment: Urine showed THC  . Sexual activity: Yes    Partners: Male  Other Topics Concern  . Not on file  Social History Narrative   Marital status: married x 12 years; wife is severe alcoholic.      Children: 3 married daughters (23, 48, 22); 1 son (35yo); 3 grandchildren born in 2017      Lives: with wife, son      Left-handed   Caffeine: 3 drinks per day   Social Determinants of Health   Financial Resource Strain:   . Difficulty of Paying Living Expenses:   Food Insecurity:   . Worried About Charity fundraiser  in the Last Year:   . Arboriculturist in the Last Year:   Transportation Needs:   . Film/video editor (Medical):   Marland Kitchen Lack of Transportation (Non-Medical):   Physical Activity:   . Days of Exercise per Week:   . Minutes of Exercise per Session:   Stress:   . Feeling of Stress :   Social Connections:   . Frequency of Communication with Friends and Family:   . Frequency of Social Gatherings with Friends and Family:   . Attends Religious Services:   . Active Member of Clubs or Organizations:   . Attends Archivist Meetings:   Marland Kitchen Marital Status:   Intimate Partner Violence:   . Fear of Current or Ex-Partner:   . Emotionally Abused:   Marland Kitchen Physically Abused:   . Sexually Abused:     Outpatient Medications Prior to Visit  Medication Sig Dispense Refill  . albuterol (VENTOLIN HFA) 108 (90 Base) MCG/ACT inhaler Inhale 2 puffs into the lungs every 6 (six) hours as needed for wheezing or shortness of breath. 18 g 5  . ALPRAZolam (XANAX) 1 MG tablet Take 1 mg by mouth 4 (four) times daily.     . carvedilol (COREG) 25 MG tablet Take 25 mg by mouth 2 (two) times daily with a meal.    . clonazePAM (KLONOPIN) 1 MG tablet Take 1 mg by mouth 3 (three) times daily as needed.    . clopidogrel (PLAVIX) 75 MG tablet Take 1 tablet (75 mg total) by mouth daily. Pt needs to schedule an appt with our office at [phone] for future refills. 30 tablet 2  . cyanocobalamin (,VITAMIN B-12,) 1000 MCG/ML injection Inject 1 mL (1,000 mcg total) into the muscle every 30 (thirty) days. 1 mL 11  . dapagliflozin propanediol (FARXIGA) 10 MG TABS tablet Take 10 mg by mouth daily before breakfast. 30 tablet 6  . diazepam (VALIUM) 10 MG tablet Take 20 mg by mouth every 12 (twelve) hours as needed for anxiety.   2  . diclofenac (VOLTAREN) 75 MG EC tablet Take 1 tablet (75 mg total) by mouth 2 (two) times daily. 20 tablet 0  . doxycycline (VIBRAMYCIN) 100 MG capsule Take 1 capsule (100 mg total) by mouth 2  (two) times daily. 14 capsule 0  . erythromycin ophthalmic ointment Place 1 application into the right eye at bedtime. 3.5 g 1  . ezetimibe (ZETIA) 10 MG tablet TAKE 1 TABLET  EACH DAY. 90 tablet 1  . ibuprofen (ADVIL,MOTRIN) 200 MG tablet Take 800 mg by mouth every 6 (six) hours as needed for moderate pain.    Marland Kitchen lactulose (CHRONULAC) 10 GM/15ML solution Take 15 mLs (10 g total) by mouth 2 (two) times daily as needed for mild constipation. 1892 mL 0  . lisinopril (PRINIVIL,ZESTRIL) 5 MG tablet Take 5 mg by mouth daily.     Marland Kitchen loperamide (IMODIUM A-D) 2 MG tablet Take 1.5 tablets (3 mg total) by mouth 4 (four) times daily as needed for diarrhea or loose stools. 30 tablet 2  . losartan (COZAAR) 25 MG tablet Take 1 tablet (25 mg total) by mouth daily. 90 tablet 3  . methadone (METHADOSE) 40 MG disintegrating tablet Take 200 mg by mouth daily.    . methocarbamol (ROBAXIN) 500 MG tablet Take 1.5 tablets (750 mg total) by mouth every 8 (eight) hours as needed for muscle spasms. 90 tablet 1  . methocarbamol (ROBAXIN) 750 MG tablet TAKE 1 OR 2 TABLETS THREE TIMES A DAY AS NEEDED. 180 tablet 5  . naproxen (NAPROSYN) 500 MG tablet Take 1 tablet (500 mg total) by mouth 2 (two) times daily as needed for moderate pain. 60 tablet 2  . nitroGLYCERIN (NITROLINGUAL) 0.4 MG/SPRAY spray Place 2 sprays under the tongue every 5 (five) minutes x 3 doses as needed for chest pain.     Marland Kitchen ondansetron (ZOFRAN-ODT) 8 MG disintegrating tablet Take 1 tablet (8 mg total) by mouth every 8 (eight) hours as needed for nausea or vomiting. 90 tablet 1  . oxymetazoline (AFRIN) 0.05 % nasal spray Place 1 spray into both nostrils 2 (two) times daily.    . pantoprazole (PROTONIX) 40 MG tablet Take 1 tablet (40 mg total) by mouth daily. 90 tablet 1  . polyethylene glycol powder (MIRALAX) 17 GM/SCOOP powder Take 17 g by mouth 2 (two) times daily as needed for mild constipation.    . rosuvastatin (CRESTOR) 40 MG tablet Take 1 tablet (40 mg  total) by mouth every evening. 30 tablet 2  . sildenafil (REVATIO) 20 MG tablet Take 5 tablets (100 mg total) by mouth as needed. 90 tablet 0  . spironolactone (ALDACTONE) 25 MG tablet TAKE (1/2) TABLET DAILY. 45 tablet 1  . sucralfate (CARAFATE) 1 g tablet Take 1 tablet (1 g total) by mouth 4 (four) times daily -  with meals and at bedtime. 120 tablet 0  . tamsulosin (FLOMAX) 0.4 MG CAPS capsule Take 1 capsule (0.4 mg total) by mouth daily after breakfast. 90 capsule 0  . TESTOSTERONE CYPIONATE IM Inject into the muscle every 14 (fourteen) days.    Marland Kitchen torsemide (DEMADEX) 20 MG tablet Take 1 tablet (20 mg total) by mouth daily as needed. For swelling in legs 90 tablet 2  . traZODone (DESYREL) 100 MG tablet Take 1 tablet (100 mg total) by mouth at bedtime. 90 tablet 0  . triamcinolone (NASACORT AQ) 55 MCG/ACT AERO nasal inhaler Place 1 spray into the nose daily. 1 Inhaler 12  . Vitamin D, Ergocalciferol, (DRISDOL) 1.25 MG (50000 UNIT) CAPS capsule Take 1 capsule (50,000 Units total) by mouth every 7 (seven) days. 12 capsule 1  . zolpidem (AMBIEN) 10 MG tablet Take 10 mg by mouth at bedtime.      No facility-administered medications prior to visit.    Allergies  Allergen Reactions  . Darvocet [Propoxyphene N-Acetaminophen] Anaphylaxis    Throat closes    Review of Systems Pertinent positives and negatives in HPI  Objective:    Physical Exam Vitals and nursing note reviewed.  Constitutional:      General: He is not in acute distress.    Appearance: Normal appearance. He is normal weight. He is not ill-appearing, toxic-appearing or diaphoretic.  Cardiovascular:     Rate and Rhythm: Normal rate and regular rhythm.  Pulmonary:     Effort: Pulmonary effort is normal. No respiratory distress.  Musculoskeletal:        General: Swelling (olecranon bursitis of R elbow.) present.  Skin:    General: Skin is warm and dry.     Coloration: Skin is not jaundiced or pale.     Findings: No  bruising, erythema, lesion or rash.  Neurological:     General: No focal deficit present.     Mental Status: He is alert and oriented to person, place, and time. Mental status is at baseline.  Psychiatric:        Mood and Affect: Mood normal.        Behavior: Behavior normal.        Thought Content: Thought content normal.        Judgment: Judgment normal.     BP 108/67   Pulse 84   Temp 98.5 F (36.9 C) (Temporal)   Resp 18   Ht 5' 10"  (1.778 m)   Wt 163 lb (73.9 kg)   SpO2 97%   BMI 23.39 kg/m  Wt Readings from Last 3 Encounters:  02/20/20 163 lb (73.9 kg)  02/13/20 194 lb 4.8 oz (88.1 kg)  01/25/20 161 lb (73 kg)    Health Maintenance Due  Topic Date Due  . INFLUENZA VACCINE  02/11/2020    There are no preventive care reminders to display for this patient.   Lab Results  Component Value Date   TSH 0.929 11/21/2019   Lab Results  Component Value Date   WBC 4.5 11/21/2019   HGB 17.7 11/21/2019   HCT 50.4 11/21/2019   MCV 94 11/21/2019   PLT 143 (L) 11/21/2019   Lab Results  Component Value Date   NA 124 (L) 11/21/2019   K 4.0 11/21/2019   CO2 26 11/21/2019   GLUCOSE 82 11/21/2019   BUN 14 11/21/2019   CREATININE 0.94 11/21/2019   BILITOT 0.3 11/21/2019   ALKPHOS 118 (H) 11/21/2019   AST 14 11/21/2019   ALT 9 11/21/2019   PROT 7.2 11/21/2019   ALBUMIN 4.7 11/21/2019   CALCIUM 9.5 11/21/2019   ANIONGAP 11 01/09/2019   GFR 84.75 01/08/2017   Lab Results  Component Value Date   CHOL 216 (H) 11/21/2019   Lab Results  Component Value Date   HDL 40 11/21/2019   Lab Results  Component Value Date   LDLCALC 157 (H) 11/21/2019   Lab Results  Component Value Date   TRIG 106 11/21/2019   Lab Results  Component Value Date   CHOLHDL 5.4 (H) 11/21/2019   Lab Results  Component Value Date   HGBA1C 5.3 06/14/2017       Assessment & Plan:   Problem List Items Addressed This Visit    None    Visit Diagnoses    Olecranon bursitis of right  elbow    -  Primary   Abnormal weight loss       Relevant Orders   Comprehensive metabolic panel   CBC With Differential   Thyroid Panel With TSH       No orders of the defined types were placed in this  encounter.  PLAN  Attempted to drain bursitis - unable. However, after removal of large bore needle, serosanguinous drainage fell in copious amounts. Able to drain entirely. Full relief for patient. Area had been adequately sterilized before and after. Steri strips placed - leave on until they fall off. Pressure bandage should stay in place for 24 hours.  Draw labs for weight loss - will follow up as warranted  Patient encouraged to call clinic with any questions, comments, or concerns.  [name], NP

## 2020-02-21 ENCOUNTER — Encounter: Payer: Self-pay | Admitting: Registered Nurse

## 2020-02-21 DIAGNOSIS — M5136 Other intervertebral disc degeneration, lumbar region: Secondary | ICD-10-CM | POA: Diagnosis not present

## 2020-02-21 DIAGNOSIS — M48061 Spinal stenosis, lumbar region without neurogenic claudication: Secondary | ICD-10-CM | POA: Diagnosis not present

## 2020-02-21 DIAGNOSIS — I700 Atherosclerosis of aorta: Secondary | ICD-10-CM | POA: Diagnosis not present

## 2020-02-21 DIAGNOSIS — M47816 Spondylosis without myelopathy or radiculopathy, lumbar region: Secondary | ICD-10-CM | POA: Diagnosis not present

## 2020-02-21 LAB — CBC WITH DIFFERENTIAL
Basophils Absolute: 0.1 10*3/uL (ref 0.0–0.2)
Basos: 1 %
EOS (ABSOLUTE): 0.1 10*3/uL (ref 0.0–0.4)
Eos: 2 %
Hematocrit: 42 % (ref 37.5–51.0)
Hemoglobin: 14.2 g/dL (ref 13.0–17.7)
Immature Grans (Abs): 0 10*3/uL (ref 0.0–0.1)
Immature Granulocytes: 1 %
Lymphocytes Absolute: 1.2 10*3/uL (ref 0.7–3.1)
Lymphs: 23 %
MCH: 32.3 pg (ref 26.6–33.0)
MCHC: 33.8 g/dL (ref 31.5–35.7)
MCV: 96 fL (ref 79–97)
Monocytes Absolute: 0.5 10*3/uL (ref 0.1–0.9)
Monocytes: 9 %
Neutrophils Absolute: 3.6 10*3/uL (ref 1.4–7.0)
Neutrophils: 64 %
RBC: 4.4 x10E6/uL (ref 4.14–5.80)
RDW: 14.6 % (ref 11.6–15.4)
WBC: 5.5 10*3/uL (ref 3.4–10.8)

## 2020-02-21 LAB — COMPREHENSIVE METABOLIC PANEL
ALT: 11 IU/L (ref 0–44)
AST: 11 IU/L (ref 0–40)
Albumin/Globulin Ratio: 1.5 (ref 1.2–2.2)
Albumin: 4 g/dL (ref 3.8–4.9)
Alkaline Phosphatase: 94 IU/L (ref 48–121)
BUN/Creatinine Ratio: 17 (ref 10–24)
BUN: 19 mg/dL (ref 8–27)
Bilirubin Total: 0.6 mg/dL (ref 0.0–1.2)
CO2: 25 mmol/L (ref 20–29)
Calcium: 9.3 mg/dL (ref 8.6–10.2)
Chloride: 98 mmol/L (ref 96–106)
Creatinine, Ser: 1.09 mg/dL (ref 0.76–1.27)
GFR calc Af Amer: 85 mL/min/{1.73_m2} (ref >59)
GFR calc non Af Amer: 73 mL/min/{1.73_m2} (ref >59)
Globulin, Total: 2.6 g/dL (ref 1.5–4.5)
Glucose: 103 mg/dL — ABNORMAL HIGH (ref 65–99)
Potassium: 4.4 mmol/L (ref 3.5–5.2)
Sodium: 138 mmol/L (ref 134–144)
Total Protein: 6.6 g/dL (ref 6.0–8.5)

## 2020-02-21 LAB — THYROID PANEL WITH TSH
Free Thyroxine Index: 2 (ref 1.2–4.9)
T3 Uptake Ratio: 30 % (ref 24–39)
T4, Total: 6.5 ug/dL (ref 4.5–12.0)
TSH: 0.353 u[IU]/mL — ABNORMAL LOW (ref 0.450–4.500)

## 2020-02-22 ENCOUNTER — Ambulatory Visit: Payer: Self-pay | Admitting: Family Medicine

## 2020-02-22 DIAGNOSIS — M48061 Spinal stenosis, lumbar region without neurogenic claudication: Secondary | ICD-10-CM | POA: Diagnosis not present

## 2020-02-22 DIAGNOSIS — I700 Atherosclerosis of aorta: Secondary | ICD-10-CM | POA: Diagnosis not present

## 2020-02-22 DIAGNOSIS — M47817 Spondylosis without myelopathy or radiculopathy, lumbosacral region: Secondary | ICD-10-CM | POA: Diagnosis not present

## 2020-02-22 DIAGNOSIS — M47816 Spondylosis without myelopathy or radiculopathy, lumbar region: Secondary | ICD-10-CM | POA: Diagnosis not present

## 2020-02-22 DIAGNOSIS — R634 Abnormal weight loss: Secondary | ICD-10-CM | POA: Diagnosis not present

## 2020-02-22 NOTE — Addendum Note (Signed)
Addended by: Norton Blizzard R on: 6/77/0340 12:08 PM   Modules accepted: Orders

## 2020-02-28 ENCOUNTER — Other Ambulatory Visit: Payer: Self-pay | Admitting: Family Medicine

## 2020-02-28 DIAGNOSIS — R110 Nausea: Secondary | ICD-10-CM

## 2020-02-28 NOTE — Telephone Encounter (Signed)
Pt seen [name] on 02/20/2020. Pt stated he gave the list to nurse and circled all medication he needs a refill on . Pt needs refill on flomax 0.4 mg, methocarbamol 750 mg, torsemide 20 mg and sildenafil pt would like 100 mg , vit d and zofran. [place]

## 2020-02-29 MED ORDER — VITAMIN D (ERGOCALCIFEROL) 1.25 MG (50000 UNIT) PO CAPS
50000.0000 [IU] | ORAL_CAPSULE | ORAL | 1 refills | Status: DC
Start: 2020-02-29 — End: 2020-08-26

## 2020-02-29 MED ORDER — TAMSULOSIN HCL 0.4 MG PO CAPS: 0.4000 mg | ORAL_CAPSULE | Freq: Every day | ORAL | 0 refills | Status: DC

## 2020-02-29 MED ORDER — TORSEMIDE 20 MG PO TABS
20.0000 mg | ORAL_TABLET | Freq: Every day | ORAL | 2 refills | Status: DC | PRN
Start: 2020-02-29 — End: 2020-10-01

## 2020-02-29 MED ORDER — ONDANSETRON 8 MG PO TBDP: 8.0000 mg | ORAL_TABLET | Freq: Three times a day (TID) | ORAL | 1 refills | Status: DC | PRN

## 2020-02-29 MED ORDER — CYANOCOBALAMIN 1000 MCG/ML IJ SOLN
1000.0000 ug | INTRAMUSCULAR | 11 refills | Status: DC
Start: 2020-02-29 — End: 2020-10-01

## 2020-02-29 MED ORDER — METHOCARBAMOL 750 MG PO TABS
ORAL_TABLET | ORAL | 5 refills | Status: DC
Start: 2020-02-29 — End: 2020-10-01

## 2020-03-04 DIAGNOSIS — M7021 Olecranon bursitis, right elbow: Secondary | ICD-10-CM | POA: Diagnosis not present

## 2020-03-04 DIAGNOSIS — M25521 Pain in right elbow: Secondary | ICD-10-CM | POA: Diagnosis not present

## 2020-03-06 DIAGNOSIS — M25521 Pain in right elbow: Secondary | ICD-10-CM | POA: Diagnosis not present

## 2020-03-06 DIAGNOSIS — M7021 Olecranon bursitis, right elbow: Secondary | ICD-10-CM | POA: Diagnosis not present

## 2020-04-04 ENCOUNTER — Ambulatory Visit (INDEPENDENT_AMBULATORY_CARE_PROVIDER_SITE_OTHER): Payer: BC Managed Care – PPO | Admitting: Emergency Medicine

## 2020-04-04 DIAGNOSIS — I255 Ischemic cardiomyopathy: Secondary | ICD-10-CM

## 2020-04-05 LAB — CUP PACEART REMOTE DEVICE CHECK
Battery Remaining Longevity: 13 mo
Battery Remaining Percentage: 11 %
Battery Voltage: 2.69 V
Brady Statistic AP VP Percent: 1 %
Brady Statistic AP VS Percent: 1 %
Brady Statistic AS VP Percent: 1 %
Brady Statistic AS VS Percent: 99 %
Brady Statistic RA Percent Paced: 1 %
Brady Statistic RV Percent Paced: 1 %
Date Time Interrogation Session: 20210923025906
HighPow Impedance: 72 Ohm
HighPow Impedance: 72 Ohm
Implantable Lead Implant Date: 20120201
Implantable Lead Implant Date: 20120201
Implantable Lead Location: 753859
Implantable Lead Location: 753860
Implantable Pulse Generator Implant Date: 20120201
Lead Channel Impedance Value: 340 Ohm
Lead Channel Impedance Value: 480 Ohm
Lead Channel Pacing Threshold Amplitude: 0.75 V
Lead Channel Pacing Threshold Amplitude: 1.75 V
Lead Channel Pacing Threshold Pulse Width: 0.5 ms
Lead Channel Pacing Threshold Pulse Width: 0.5 ms
Lead Channel Sensing Intrinsic Amplitude: 12 mV
Lead Channel Sensing Intrinsic Amplitude: 3.1 mV
Lead Channel Setting Pacing Amplitude: 2 V
Lead Channel Setting Pacing Amplitude: 3 V
Lead Channel Setting Pacing Pulse Width: 0.5 ms
Lead Channel Setting Sensing Sensitivity: 0.5 mV
Pulse Gen Serial Number: 815096

## 2020-04-06 ENCOUNTER — Other Ambulatory Visit (HOSPITAL_COMMUNITY): Payer: Self-pay | Admitting: Internal Medicine

## 2020-04-09 NOTE — Progress Notes (Signed)
Remote ICD transmission.   

## 2020-05-08 ENCOUNTER — Other Ambulatory Visit (HOSPITAL_COMMUNITY): Payer: Self-pay | Admitting: Internal Medicine

## 2020-05-17 DIAGNOSIS — G4733 Obstructive sleep apnea (adult) (pediatric): Secondary | ICD-10-CM | POA: Diagnosis not present

## 2020-05-17 DIAGNOSIS — R5381 Other malaise: Secondary | ICD-10-CM | POA: Diagnosis not present

## 2020-05-17 DIAGNOSIS — R69 Illness, unspecified: Secondary | ICD-10-CM | POA: Diagnosis not present

## 2020-05-29 ENCOUNTER — Other Ambulatory Visit: Payer: Self-pay | Admitting: Family Medicine

## 2020-05-29 ENCOUNTER — Telehealth: Payer: Self-pay | Admitting: Family Medicine

## 2020-05-29 MED ORDER — TAMSULOSIN HCL 0.4 MG PO CAPS: 0.4000 mg | ORAL_CAPSULE | Freq: Every day | ORAL | 0 refills | Status: DC

## 2020-05-29 NOTE — Telephone Encounter (Signed)
Please call patient to schedule appointment- Rx has been forwarded to Rx pool for review

## 2020-05-29 NOTE — Telephone Encounter (Signed)
Request from [facility] regarding patient's torsemide 20 mg tablets.  Previously written for 1 tablet daily as needed for swelling in the legs.  Last filled in August by Dr. Pamella Pert.  Note on paper from pharmacy indicates patient was taking 3 to 4 tablets daily and is out of medication.  On chart review I do not see where this dose has been prescribed.  Previously 20 mg by mouth daily as needed.  Last ordered August 19 with 90 and 2 refills.   Called patient: Typically used once per day, but past few weeks has been having to take more. Taking 2-3 per day now - more leg swelling and difficulty producing urine. Slow trickle. Trouble getting urine out. Has to sit on toilet for prolonged time. No abdominal pain.  No chest pain or shortness of breath.  Has discussed leg swelling with cardiology - did not think was heart related.  Has urologist - has not discussed recent urinary issues with his urologist.  Had a fall down 8 stairs 2-3 weeks ago. No medical evaluation since that fall.   New numbness in left leg since fall 2-3 weeks ago. Numbness in groin area, preceeded fall, but feels worse since fall.  Given reported above symptoms of worsening leg swelling after fall down 8 stairs, increasing groin numbness, and difficulty urinating, ER eval recommended tonight. He plans to go tomorrow. All questions were answered. Understanding expressed.

## 2020-05-29 NOTE — Telephone Encounter (Signed)
Pt needs a refill for tamsulosin (FLOMAX) 0.4 MG CAPS capsule  Sent to [facility] also mentioned that he wants to establish care with [facility] Lovett [facility]/ please advise on an appt

## 2020-05-30 NOTE — Telephone Encounter (Signed)
Pls call pt to schedule appt

## 2020-05-30 NOTE — Telephone Encounter (Signed)
Tried to call pt and schedule appt vm was full. Will try again latter. Please advise.

## 2020-07-04 ENCOUNTER — Ambulatory Visit (INDEPENDENT_AMBULATORY_CARE_PROVIDER_SITE_OTHER): Payer: BC Managed Care – PPO

## 2020-07-04 DIAGNOSIS — I255 Ischemic cardiomyopathy: Secondary | ICD-10-CM | POA: Diagnosis not present

## 2020-07-05 LAB — CUP PACEART REMOTE DEVICE CHECK
Battery Remaining Longevity: 7 mo
Battery Remaining Percentage: 7 %
Battery Voltage: 2.65 V
Brady Statistic AP VP Percent: 1 %
Brady Statistic AP VS Percent: 1 %
Brady Statistic AS VP Percent: 1 %
Brady Statistic AS VS Percent: 99 %
Brady Statistic RA Percent Paced: 1 %
Brady Statistic RV Percent Paced: 1 %
Date Time Interrogation Session: 20211223023037
HighPow Impedance: 70 Ohm
HighPow Impedance: 70 Ohm
Implantable Lead Implant Date: 20120201
Implantable Lead Implant Date: 20120201
Implantable Lead Location: 753859
Implantable Lead Location: 753860
Implantable Pulse Generator Implant Date: 20120201
Lead Channel Impedance Value: 350 Ohm
Lead Channel Impedance Value: 410 Ohm
Lead Channel Pacing Threshold Amplitude: 0.75 V
Lead Channel Pacing Threshold Amplitude: 1.75 V
Lead Channel Pacing Threshold Pulse Width: 0.5 ms
Lead Channel Pacing Threshold Pulse Width: 0.5 ms
Lead Channel Sensing Intrinsic Amplitude: 12 mV
Lead Channel Sensing Intrinsic Amplitude: 4 mV
Lead Channel Setting Pacing Amplitude: 2 V
Lead Channel Setting Pacing Amplitude: 3 V
Lead Channel Setting Pacing Pulse Width: 0.5 ms
Lead Channel Setting Sensing Sensitivity: 0.5 mV
Pulse Gen Serial Number: 815096

## 2020-07-08 ENCOUNTER — Other Ambulatory Visit (HOSPITAL_COMMUNITY): Payer: Self-pay | Admitting: Internal Medicine

## 2020-07-15 DIAGNOSIS — F4322 Adjustment disorder with anxiety: Secondary | ICD-10-CM | POA: Diagnosis not present

## 2020-07-18 NOTE — Progress Notes (Signed)
Remote ICD transmission.   

## 2020-07-19 ENCOUNTER — Other Ambulatory Visit: Payer: Self-pay

## 2020-07-19 ENCOUNTER — Encounter: Payer: Self-pay | Admitting: Registered Nurse

## 2020-07-19 ENCOUNTER — Ambulatory Visit (INDEPENDENT_AMBULATORY_CARE_PROVIDER_SITE_OTHER): Payer: BC Managed Care – PPO | Admitting: Registered Nurse

## 2020-07-19 VITALS — BP 107/68 | HR 74 | Temp 97.9°F | Ht 71.0 in | Wt 171.0 lb

## 2020-07-19 DIAGNOSIS — R296 Repeated falls: Secondary | ICD-10-CM | POA: Diagnosis not present

## 2020-07-19 DIAGNOSIS — R634 Abnormal weight loss: Secondary | ICD-10-CM | POA: Diagnosis not present

## 2020-07-19 DIAGNOSIS — Z789 Other specified health status: Secondary | ICD-10-CM | POA: Diagnosis not present

## 2020-07-19 DIAGNOSIS — J9611 Chronic respiratory failure with hypoxia: Secondary | ICD-10-CM | POA: Diagnosis not present

## 2020-07-19 DIAGNOSIS — R7989 Other specified abnormal findings of blood chemistry: Secondary | ICD-10-CM | POA: Diagnosis not present

## 2020-07-19 DIAGNOSIS — Z7409 Other reduced mobility: Secondary | ICD-10-CM

## 2020-07-19 DIAGNOSIS — I5042 Chronic combined systolic (congestive) and diastolic (congestive) heart failure: Secondary | ICD-10-CM

## 2020-07-19 DIAGNOSIS — M6281 Muscle weakness (generalized): Secondary | ICD-10-CM | POA: Diagnosis not present

## 2020-07-19 NOTE — Patient Instructions (Signed)
° ° ° °  If you have lab work done today you will be contacted with your lab results within the next 2 weeks.  If you have not heard from us then please contact us. The fastest way to get your results is to register for My Chart. ° ° °IF you received an x-ray today, you will receive an invoice from [facility]. Please contact [facility] at [phone] with questions or concerns regarding your invoice.  ° °IF you received labwork today, you will receive an invoice from [facility]. Please contact [facility] at [phone] with questions or concerns regarding your invoice.  ° °Our billing staff will not be able to assist you with questions regarding bills from these companies. ° °You will be contacted with the lab results as soon as they are available. The fastest way to get your results is to activate your My Chart account. Instructions are located on the last page of this paperwork. If you have not heard from us regarding the results in 2 weeks, please contact this office. °  ° ° ° °

## 2020-07-20 LAB — LIPID PANEL
Chol/HDL Ratio: 4.3 ratio (ref 0.0–5.0)
Cholesterol, Total: 195 mg/dL (ref 100–199)
HDL: 45 mg/dL (ref >39)
LDL Chol Calc (NIH): 137 mg/dL — ABNORMAL HIGH (ref 0–99)
Triglycerides: 70 mg/dL (ref 0–149)
VLDL Cholesterol Cal: 13 mg/dL (ref 5–40)

## 2020-07-20 LAB — CBC WITH DIFFERENTIAL
Basophils Absolute: 0 10*3/uL (ref 0.0–0.2)
Basos: 0 %
EOS (ABSOLUTE): 0.1 10*3/uL (ref 0.0–0.4)
Eos: 1 %
Hematocrit: 44.4 % (ref 37.5–51.0)
Hemoglobin: 15.4 g/dL (ref 13.0–17.7)
Immature Grans (Abs): 0 10*3/uL (ref 0.0–0.1)
Immature Granulocytes: 0 %
Lymphocytes Absolute: 1.2 10*3/uL (ref 0.7–3.1)
Lymphs: 12 %
MCH: 32.6 pg (ref 26.6–33.0)
MCHC: 34.7 g/dL (ref 31.5–35.7)
MCV: 94 fL (ref 79–97)
Monocytes Absolute: 0.6 10*3/uL (ref 0.1–0.9)
Monocytes: 7 %
Neutrophils Absolute: 7.7 10*3/uL — ABNORMAL HIGH (ref 1.4–7.0)
Neutrophils: 80 %
RBC: 4.73 x10E6/uL (ref 4.14–5.80)
RDW: 12.5 % (ref 11.6–15.4)
WBC: 9.7 10*3/uL (ref 3.4–10.8)

## 2020-07-20 LAB — COMPREHENSIVE METABOLIC PANEL
ALT: 7 IU/L (ref 0–44)
AST: 9 IU/L (ref 0–40)
Albumin/Globulin Ratio: 1.4 (ref 1.2–2.2)
Albumin: 4 g/dL (ref 3.8–4.9)
Alkaline Phosphatase: 96 IU/L (ref 44–121)
BUN/Creatinine Ratio: 12 (ref 10–24)
BUN: 12 mg/dL (ref 8–27)
Bilirubin Total: 0.3 mg/dL (ref 0.0–1.2)
CO2: 22 mmol/L (ref 20–29)
Calcium: 9.2 mg/dL (ref 8.6–10.2)
Chloride: 97 mmol/L (ref 96–106)
Creatinine, Ser: 0.98 mg/dL (ref 0.76–1.27)
GFR calc Af Amer: 96 mL/min/{1.73_m2} (ref >59)
GFR calc non Af Amer: 83 mL/min/{1.73_m2} (ref >59)
Globulin, Total: 2.8 g/dL (ref 1.5–4.5)
Glucose: 72 mg/dL (ref 65–99)
Potassium: 4 mmol/L (ref 3.5–5.2)
Sodium: 137 mmol/L (ref 134–144)
Total Protein: 6.8 g/dL (ref 6.0–8.5)

## 2020-07-20 LAB — HEMOGLOBIN A1C
Est. average glucose Bld gHb Est-mCnc: 117 mg/dL
Hgb A1c MFr Bld: 5.7 % — ABNORMAL HIGH (ref 4.8–5.6)

## 2020-07-20 LAB — TSH: TSH: 1.24 u[IU]/mL (ref 0.450–4.500)

## 2020-07-25 ENCOUNTER — Other Ambulatory Visit: Payer: Self-pay | Admitting: Registered Nurse

## 2020-07-25 ENCOUNTER — Telehealth: Payer: Self-pay | Admitting: Registered Nurse

## 2020-07-25 DIAGNOSIS — I272 Pulmonary hypertension, unspecified: Secondary | ICD-10-CM

## 2020-07-25 NOTE — Telephone Encounter (Signed)
Patient is having home health aid call to follow up on  Request for  24 hour in home health assistance. / pt had an in office  appointment   To start the process for him.   Please advise / Patient is concerned because he has heard back from [place] about this request

## 2020-07-26 ENCOUNTER — Encounter: Payer: Self-pay | Admitting: Registered Nurse

## 2020-07-26 ENCOUNTER — Other Ambulatory Visit: Payer: Self-pay

## 2020-07-26 ENCOUNTER — Telehealth (INDEPENDENT_AMBULATORY_CARE_PROVIDER_SITE_OTHER): Payer: BC Managed Care – PPO | Admitting: Registered Nurse

## 2020-07-26 VITALS — Ht 71.0 in

## 2020-07-26 DIAGNOSIS — L03119 Cellulitis of unspecified part of limb: Secondary | ICD-10-CM | POA: Diagnosis not present

## 2020-07-26 DIAGNOSIS — R609 Edema, unspecified: Secondary | ICD-10-CM

## 2020-07-26 MED ORDER — SULFAMETHOXAZOLE-TRIMETHOPRIM 800-160 MG PO TABS: 1.0000 | ORAL_TABLET | Freq: Two times a day (BID) | ORAL | 0 refills | Status: DC

## 2020-07-26 NOTE — Telephone Encounter (Signed)
I actually just checked in on this with Nathanial Millman today. This is in process - it's into the insurance end of things at this point. If you want to call patient and let him know htat would be great. If that's satisfactory for him we may not need to do a visit with him today Thank you  Denice Paradise

## 2020-07-26 NOTE — Telephone Encounter (Signed)
Pt has appointment about this today. Please let us know if there is anything you need [place] to do.

## 2020-07-26 NOTE — Patient Instructions (Signed)
° ° ° °  If you have lab work done today you will be contacted with your lab results within the next 2 weeks.  If you have not heard from us then please contact us. The fastest way to get your results is to register for My Chart. ° ° °IF you received an x-ray today, you will receive an invoice from [facility]. Please contact [facility] at [phone] with questions or concerns regarding your invoice.  ° °IF you received labwork today, you will receive an invoice from [facility]. Please contact [facility] at [phone] with questions or concerns regarding your invoice.  ° °Our billing staff will not be able to assist you with questions regarding bills from these companies. ° °You will be contacted with the lab results as soon as they are available. The fastest way to get your results is to activate your My Chart account. Instructions are located on the last page of this paperwork. If you have not heard from us regarding the results in 2 weeks, please contact this office. °  ° ° ° °

## 2020-07-26 NOTE — Progress Notes (Signed)
Established Patient Office Visit  Subjective:  Patient ID: [name], male    DOB: 08-Jul-1961  Age: 60 y.o. MRN: [id number]  CC:  Chief Complaint  Patient presents with  . home health care     Ongoing Health care     HPI [name] presents for home health referral  States that with his limited mobility and multiple chronic medical conditions, he feels he would benefit from nursing, PT and OT. Has a home health aid, Georgina Snell, who is here with him today. Georgina Snell has been a great support but is somewhat limited in what he can do given Mr. Auriemma decline over the years. No new acute concerns. Feeling well overall.   Past Medical History:  Diagnosis Date  . Anxiety   . Automatic implantable cardiac defibrillator [employer]    Analyze ST  . Benign prostatic hypertrophy   . Benzodiazepine dependence (HCC)    chronic  . CAD (coronary artery disease)    Last cath 2/12. 3-v CAD. Failed PCI of distal RCAc  CATH [facility] 4/13 with DES to  LAD X2  . CHF (congestive heart failure) ([place])   . Chronic back pain    lumbar stenosis  . Chronic systolic heart failure (HCC)    EF 20-25%. s/p ST. Jude ICD  . Depression   . DJD (degenerative joint disease)   . History of testicular cancer   . Ischemic cardiomyopathy   . Myocardial infarction ([place])   . Narcotic dependence (HCC)    chronic  . OSA on CPAP   . Polycythemia, secondary    S/p hematology consultation for polycythema on 01/06/18.  S/p bone marrow biopsy on 12/31/17 that was negative for myeloproliferative disorder.  Feels secondary to sleep apnea, active smoking, testosterone supplementation, nocturnal hypoxia  . TIA (transient ischemic attack)   . Vitamin B12 deficiency 07/22/2016    Past Surgical History:  Procedure Laterality Date  . ASD REPAIR, SINUS VENOSUS    . CARDIAC DEFIBRILLATOR PLACEMENT  08/2010  . HERNIA REPAIR    . LEFT HEART CATHETERIZATION WITH CORONARY ANGIOGRAM N/A 06/14/2014   Procedure: LEFT HEART  CATHETERIZATION WITH CORONARY ANGIOGRAM;  Surgeon: Jolaine Artist, MD;  Location: [facility] CATH LAB;  Service: Cardiovascular;  Laterality: N/A;  . TESTICLE SURGERY     testicular cancer surgery left testicle removed    Family History  Problem Relation Age of Onset  . Heart disease Father 30       Died of MI  . Cancer Mother     Social History   Socioeconomic History  . Marital status: Divorced    Spouse name: Not on file  . Number of children: 4  . Years of education: Not on file  . Highest education level: Not on file  Occupational History  . Not on file  Tobacco Use  . Smoking status: Current Every Day Smoker    Packs/day: 0.50    Years: 29.00    Pack years: 14.50    Types: Cigarettes, E-cigarettes    Last attempt to quit: 07/14/2015    Years since quitting: 5.0  . Smokeless tobacco: Never Used  . Tobacco comment: no cigarette in 4 days but yes to e-cig  Vaping Use  . Vaping Use: Every day  . Start date: 07/14/2015  Substance and Sexual Activity  . Alcohol use: No    Alcohol/week: 0.0 standard drinks  . Drug use: Yes    Types: Marijuana    Comment: Urine showed  THC  . Sexual activity: Yes    Partners: Male  Other Topics Concern  . Not on file  Social History Narrative   Marital status: married x 12 years; wife is severe alcoholic.      Children: 3 married daughters (23, 53, 52); 1 son (20yo); 3 grandchildren born in 2017      Lives: with wife, son      Left-handed   Caffeine: 3 drinks per day   Social Determinants of Health   Financial Resource Strain: Not on file  Food Insecurity: Not on file  Transportation Needs: Not on file  Physical Activity: Not on file  Stress: Not on file  Social Connections: Not on file  Intimate Partner Violence: Not on file    Outpatient Medications Prior to Visit  Medication Sig Dispense Refill  . albuterol (VENTOLIN HFA) 108 (90 Base) MCG/ACT inhaler Inhale 2 puffs into the lungs every 6 (six) hours as needed for wheezing or  shortness of breath. 18 g 5  . ALPRAZolam (XANAX) 1 MG tablet Take 1 mg by mouth 4 (four) times daily.     . carvedilol (COREG) 25 MG tablet Take 25 mg by mouth 2 (two) times daily with a meal.    . clopidogrel (PLAVIX) 75 MG tablet TAKE 1 TABLET EACH DAY. 90 tablet 0  . cyanocobalamin (,VITAMIN B-12,) 1000 MCG/ML injection Inject 1 mL (1,000 mcg total) into the muscle every 30 (thirty) days. 1 mL 11  . diazepam (VALIUM) 10 MG tablet Take 20 mg by mouth every 12 (twelve) hours as needed for anxiety.   2  . ibuprofen (ADVIL,MOTRIN) 200 MG tablet Take 800 mg by mouth every 6 (six) hours as needed for moderate pain.    Marland Kitchen lactulose (CHRONULAC) 10 GM/15ML solution Take 15 mLs (10 g total) by mouth 2 (two) times daily as needed for mild constipation. 1892 mL 0  . losartan (COZAAR) 25 MG tablet Take 1 tablet (25 mg total) by mouth daily. 90 tablet 3  . methadone (METHADOSE) 40 MG disintegrating tablet Take 200 mg by mouth daily.    . methocarbamol (ROBAXIN) 750 MG tablet TAKE 1 OR 2 TABLETS THREE TIMES A DAY AS NEEDED. 180 tablet 5  . naproxen (NAPROSYN) 500 MG tablet Take 1 tablet (500 mg total) by mouth 2 (two) times daily as needed for moderate pain. 60 tablet 2  . nitroGLYCERIN (NITROLINGUAL) 0.4 MG/SPRAY spray Place 2 sprays under the tongue every 5 (five) minutes x 3 doses as needed for chest pain.     Marland Kitchen ondansetron (ZOFRAN-ODT) 8 MG disintegrating tablet Take 1 tablet (8 mg total) by mouth every 8 (eight) hours as needed for nausea or vomiting. 90 tablet 1  . oxymetazoline (AFRIN) 0.05 % nasal spray Place 1 spray into both nostrils 2 (two) times daily.    . pantoprazole (PROTONIX) 40 MG tablet Take 1 tablet (40 mg total) by mouth daily. 90 tablet 1  . polyethylene glycol powder (GLYCOLAX/MIRALAX) 17 GM/SCOOP powder Take 17 g by mouth 2 (two) times daily as needed for mild constipation.    Marland Kitchen spironolactone (ALDACTONE) 25 MG tablet TAKE (1/2) TABLET DAILY. (Patient taking differently: 25 mg. TAKE  (1/2) TABLET DAILY.) 45 tablet 1  . tamsulosin (FLOMAX) 0.4 MG CAPS capsule Take 1 capsule (0.4 mg total) by mouth daily after breakfast. 90 capsule 0  . TESTOSTERONE CYPIONATE IM Inject into the muscle every 14 (fourteen) days.    Marland Kitchen torsemide (DEMADEX) 20 MG tablet Take 1 tablet (  20 mg total) by mouth daily as needed. For swelling in legs 90 tablet 2  . traZODone (DESYREL) 100 MG tablet Take 1 tablet (100 mg total) by mouth at bedtime. 90 tablet 0  . triamcinolone (NASACORT AQ) 55 MCG/ACT AERO nasal inhaler Place 1 spray into the nose daily. 1 Inhaler 12  . Vitamin D, Ergocalciferol, (DRISDOL) 1.25 MG (50000 UNIT) CAPS capsule Take 1 capsule (50,000 Units total) by mouth every 7 (seven) days. 12 capsule 1  . zolpidem (AMBIEN) 10 MG tablet Take 10 mg by mouth at bedtime.     . methocarbamol (ROBAXIN) 500 MG tablet Take 1.5 tablets (750 mg total) by mouth every 8 (eight) hours as needed for muscle spasms. 90 tablet 1  . sildenafil (REVATIO) 20 MG tablet Take 5 tablets (100 mg total) by mouth as needed. 90 tablet 0  . clonazePAM (KLONOPIN) 1 MG tablet Take 1 mg by mouth 3 (three) times daily as needed. (Patient not taking: No sig reported)    . dapagliflozin propanediol (FARXIGA) 10 MG TABS tablet Take 10 mg by mouth daily before breakfast. 30 tablet 6  . diclofenac (VOLTAREN) 75 MG EC tablet Take 1 tablet (75 mg total) by mouth 2 (two) times daily. 20 tablet 0  . doxycycline (VIBRAMYCIN) 100 MG capsule Take 1 capsule (100 mg total) by mouth 2 (two) times daily. 14 capsule 0  . erythromycin ophthalmic ointment Place 1 application into the right eye at bedtime. 3.5 g 1  . ezetimibe (ZETIA) 10 MG tablet TAKE 1 TABLET EACH DAY. (Patient not taking: Reported on 07/26/2020) 90 tablet 0  . lisinopril (PRINIVIL,ZESTRIL) 5 MG tablet Take 5 mg by mouth daily.    Marland Kitchen loperamide (IMODIUM A-D) 2 MG tablet Take 1.5 tablets (3 mg total) by mouth 4 (four) times daily as needed for diarrhea or loose stools. 30 tablet 2   . rosuvastatin (CRESTOR) 40 MG tablet Take 1 tablet (40 mg total) by mouth every evening. (Patient not taking: No sig reported) 30 tablet 2  . sucralfate (CARAFATE) 1 g tablet Take 1 tablet (1 g total) by mouth 4 (four) times daily -  with meals and at bedtime. (Patient not taking: No sig reported) 120 tablet 0   No facility-administered medications prior to visit.    Allergies  Allergen Reactions  . Darvocet [Propoxyphene N-Acetaminophen] Anaphylaxis    Throat closes    ROS Review of Systems  Constitutional: Negative.   HENT: Negative.   Eyes: Negative.   Respiratory: Negative.   Cardiovascular: Negative.   Gastrointestinal: Negative.   Genitourinary: Negative.   Musculoskeletal: Negative.   Skin: Negative.   Neurological: Negative.   Psychiatric/Behavioral: Negative.   All other systems reviewed and are negative.     Objective:    Physical Exam Constitutional:      General: He is not in acute distress.    Appearance: Normal appearance. He is normal weight. He is not ill-appearing, toxic-appearing or diaphoretic.  Cardiovascular:     Rate and Rhythm: Normal rate and regular rhythm.     Heart sounds: Normal heart sounds. No murmur heard. No friction rub. No gallop.   Pulmonary:     Effort: Pulmonary effort is normal. No respiratory distress.     Breath sounds: Normal breath sounds. No stridor. No wheezing, rhonchi or rales.  Chest:     Chest wall: No tenderness.  Neurological:     General: No focal deficit present.     Mental Status: He is alert and  oriented to person, place, and time. Mental status is at baseline.  Psychiatric:        Mood and Affect: Mood normal.        Behavior: Behavior normal.        Thought Content: Thought content normal.        Judgment: Judgment normal.     BP 107/68   Pulse 74   Temp 97.9 F (36.6 C) (Temporal)   Ht 5' 11"  (1.803 m)   Wt 171 lb (77.6 kg)   SpO2 92%   BMI 23.85 kg/m  Wt Readings from Last 3 Encounters:   07/19/20 171 lb (77.6 kg)  02/20/20 163 lb (73.9 kg)  02/13/20 194 lb 4.8 oz (88.1 kg)     Health Maintenance Due  Topic Date Due  . COVID-19 Vaccine (2 - Booster for [employer] series) 11/15/2019  . INFLUENZA VACCINE  02/11/2020    There are no preventive care reminders to display for this patient.  Lab Results  Component Value Date   TSH 1.240 07/19/2020   Lab Results  Component Value Date   WBC 9.7 07/19/2020   HGB 15.4 07/19/2020   HCT 44.4 07/19/2020   MCV 94 07/19/2020   PLT 143 (L) 11/21/2019   Lab Results  Component Value Date   NA 137 07/19/2020   K 4.0 07/19/2020   CO2 22 07/19/2020   GLUCOSE 72 07/19/2020   BUN 12 07/19/2020   CREATININE 0.98 07/19/2020   BILITOT 0.3 07/19/2020   ALKPHOS 96 07/19/2020   AST 9 07/19/2020   ALT 7 07/19/2020   PROT 6.8 07/19/2020   ALBUMIN 4.0 07/19/2020   CALCIUM 9.2 07/19/2020   ANIONGAP 11 01/09/2019   GFR 84.75 01/08/2017   Lab Results  Component Value Date   CHOL 195 07/19/2020   Lab Results  Component Value Date   HDL 45 07/19/2020   Lab Results  Component Value Date   LDLCALC 137 (H) 07/19/2020   Lab Results  Component Value Date   TRIG 70 07/19/2020   Lab Results  Component Value Date   CHOLHDL 4.3 07/19/2020   Lab Results  Component Value Date   HGBA1C 5.7 (H) 07/19/2020      Assessment & Plan:   Problem List Items Addressed This Visit      Cardiovascular and Mediastinum   COMBINED HEART FAILURE, CHRONIC - Primary   Relevant Orders   Ambulatory referral to [facility]   CBC With Differential (Completed)   Comprehensive metabolic panel (Completed)   Lipid panel (Completed)     Respiratory   Chronic respiratory failure with hypoxia ([place])   Relevant Orders   Ambulatory referral to [place]    Other Visit Diagnoses    Abnormal weight loss       Relevant Orders   Ambulatory referral to Home Health   Hemoglobin A1c (Completed)   Impaired mobility and activities of daily living        Relevant Orders   Ambulatory referral to Home Health   Hemoglobin A1c (Completed)   Frequent falls       Relevant Orders   Ambulatory referral to Home Health   Hemoglobin A1c (Completed)   Muscle weakness of lower extremity       Relevant Orders   Ambulatory referral to Home Health   Hemoglobin A1c (Completed)   Low TSH level       Relevant Orders   TSH (Completed)      No orders of the  defined types were placed in this encounter.   Follow-up: No follow-ups on file.   PLAN  Labs collected. Will follow up with the patient as warranted.  Refer to home health - will list myself as PCP after this TOC and sign orders as needed  Follow up in office [id number] if concerns arise  Patient encouraged to call clinic with any questions, comments, or concerns.  RMaximiano Coss NP

## 2020-07-29 NOTE — Telephone Encounter (Signed)
Other concerns have been address in his virtual visit.

## 2020-08-02 ENCOUNTER — Telehealth: Payer: Self-pay | Admitting: Emergency Medicine

## 2020-08-02 NOTE — Telephone Encounter (Addendum)
Attempted to contact patient on home and mobile phone in reference to monthly battery checks scheduled. Unable to leave voicemail due to mailbox is full. Same primary number for home and cell. Unable to leave message. Last remote received on 07/04/2020 showing battery longevity 7.1 months. Next remote battery check scheduled on 08/05/2020. Patient does send remote transmissions manually.

## 2020-08-05 DIAGNOSIS — G8929 Other chronic pain: Secondary | ICD-10-CM | POA: Diagnosis not present

## 2020-08-05 DIAGNOSIS — I255 Ischemic cardiomyopathy: Secondary | ICD-10-CM | POA: Diagnosis not present

## 2020-08-05 DIAGNOSIS — I252 Old myocardial infarction: Secondary | ICD-10-CM | POA: Diagnosis not present

## 2020-08-05 DIAGNOSIS — N400 Enlarged prostate without lower urinary tract symptoms: Secondary | ICD-10-CM | POA: Diagnosis not present

## 2020-08-05 DIAGNOSIS — I5042 Chronic combined systolic (congestive) and diastolic (congestive) heart failure: Secondary | ICD-10-CM | POA: Diagnosis not present

## 2020-08-05 DIAGNOSIS — F419 Anxiety disorder, unspecified: Secondary | ICD-10-CM | POA: Diagnosis not present

## 2020-08-05 DIAGNOSIS — G4733 Obstructive sleep apnea (adult) (pediatric): Secondary | ICD-10-CM | POA: Diagnosis not present

## 2020-08-05 DIAGNOSIS — I2510 Atherosclerotic heart disease of native coronary artery without angina pectoris: Secondary | ICD-10-CM | POA: Diagnosis not present

## 2020-08-05 DIAGNOSIS — F17210 Nicotine dependence, cigarettes, uncomplicated: Secondary | ICD-10-CM | POA: Diagnosis not present

## 2020-08-05 DIAGNOSIS — I110 Hypertensive heart disease with heart failure: Secondary | ICD-10-CM | POA: Diagnosis not present

## 2020-08-05 DIAGNOSIS — J9611 Chronic respiratory failure with hypoxia: Secondary | ICD-10-CM | POA: Diagnosis not present

## 2020-08-05 DIAGNOSIS — M1991 Primary osteoarthritis, unspecified site: Secondary | ICD-10-CM | POA: Diagnosis not present

## 2020-08-05 DIAGNOSIS — M48061 Spinal stenosis, lumbar region without neurogenic claudication: Secondary | ICD-10-CM | POA: Diagnosis not present

## 2020-08-05 DIAGNOSIS — E785 Hyperlipidemia, unspecified: Secondary | ICD-10-CM | POA: Diagnosis not present

## 2020-08-05 DIAGNOSIS — F32A Depression, unspecified: Secondary | ICD-10-CM | POA: Diagnosis not present

## 2020-08-05 DIAGNOSIS — D751 Secondary polycythemia: Secondary | ICD-10-CM | POA: Diagnosis not present

## 2020-08-06 ENCOUNTER — Telehealth: Payer: Self-pay | Admitting: Emergency Medicine

## 2020-08-06 ENCOUNTER — Telehealth (HOSPITAL_COMMUNITY): Payer: Self-pay | Admitting: *Deleted

## 2020-08-06 NOTE — Telephone Encounter (Signed)
Pt left VM requesting a call back from [facility] stating he has some swelling in his legs he takes Torsemide as needed and it gets rid of the swelling. Patient said but taking torsemide makes him feel like his bladder will burst and wants to know if there is a long acting fluid medication he can take instead of a PRN medication. I could not understand the rest of what patient was saying so I called back to get more information. No answer/lmtrc.

## 2020-08-06 NOTE — Telephone Encounter (Signed)
Patient had some questions about his prescribed Demadex 20 mg PRN. Patient wanted to know if he could be prescribed another medication for fluid retention that was long lasting and not just as needed. Patient states that he does not like the way the Demadex makes his bladder feel due to increased pressure. Patient also complains of constipation. Patient was asked if he had any edema in his legs and hands. Patient states past issues of edema but did not specify if he was having a current issue with edema. Advised patient that I would send this information to Dr. Caryl Comes

## 2020-08-06 NOTE — Telephone Encounter (Signed)
Contacted patient about monthly battery checks. Patient returned call and was advised of monthly battery checks. Patient states that his transmissions are automatically sent  Battery longevity 7.1 months from last remote on July 04, 2020.

## 2020-08-07 NOTE — Telephone Encounter (Signed)
Attempted phone call to pt.  Unable to leave voicemail message due to voicemail message being full.

## 2020-08-12 NOTE — Telephone Encounter (Signed)
M  he should followup with CHF clinic regarding his diuretics. Thanks SK

## 2020-08-14 DIAGNOSIS — I110 Hypertensive heart disease with heart failure: Secondary | ICD-10-CM | POA: Diagnosis not present

## 2020-08-14 DIAGNOSIS — F17210 Nicotine dependence, cigarettes, uncomplicated: Secondary | ICD-10-CM | POA: Diagnosis not present

## 2020-08-14 DIAGNOSIS — I255 Ischemic cardiomyopathy: Secondary | ICD-10-CM | POA: Diagnosis not present

## 2020-08-14 DIAGNOSIS — M48061 Spinal stenosis, lumbar region without neurogenic claudication: Secondary | ICD-10-CM | POA: Diagnosis not present

## 2020-08-14 DIAGNOSIS — G4733 Obstructive sleep apnea (adult) (pediatric): Secondary | ICD-10-CM | POA: Diagnosis not present

## 2020-08-14 DIAGNOSIS — I252 Old myocardial infarction: Secondary | ICD-10-CM | POA: Diagnosis not present

## 2020-08-14 DIAGNOSIS — M1991 Primary osteoarthritis, unspecified site: Secondary | ICD-10-CM | POA: Diagnosis not present

## 2020-08-14 DIAGNOSIS — G8929 Other chronic pain: Secondary | ICD-10-CM | POA: Diagnosis not present

## 2020-08-14 DIAGNOSIS — I2510 Atherosclerotic heart disease of native coronary artery without angina pectoris: Secondary | ICD-10-CM | POA: Diagnosis not present

## 2020-08-14 DIAGNOSIS — F419 Anxiety disorder, unspecified: Secondary | ICD-10-CM | POA: Diagnosis not present

## 2020-08-14 DIAGNOSIS — J9611 Chronic respiratory failure with hypoxia: Secondary | ICD-10-CM | POA: Diagnosis not present

## 2020-08-14 DIAGNOSIS — I5042 Chronic combined systolic (congestive) and diastolic (congestive) heart failure: Secondary | ICD-10-CM | POA: Diagnosis not present

## 2020-08-14 DIAGNOSIS — F32A Depression, unspecified: Secondary | ICD-10-CM | POA: Diagnosis not present

## 2020-08-14 DIAGNOSIS — N400 Enlarged prostate without lower urinary tract symptoms: Secondary | ICD-10-CM | POA: Diagnosis not present

## 2020-08-14 DIAGNOSIS — D751 Secondary polycythemia: Secondary | ICD-10-CM | POA: Diagnosis not present

## 2020-08-14 DIAGNOSIS — E785 Hyperlipidemia, unspecified: Secondary | ICD-10-CM | POA: Diagnosis not present

## 2020-08-16 ENCOUNTER — Telehealth: Payer: Self-pay | Admitting: Registered Nurse

## 2020-08-16 NOTE — Telephone Encounter (Signed)
MyChart message sent to pt re: Demadex questions and advised per Dr Caryl Comes questions should be directed to CHF clinic.  See [place] encounter dated 08/16/2020 for complete details.

## 2020-08-16 NOTE — Telephone Encounter (Signed)
[name], OT with [facility] - Caller/Agency:  Callback Number: [phone] Requesting OT/PT/Skilled Nursing/Social Work/Speech Therapy: Requesting drop arm 3 in 1 potty chair - send to [facility] Frequency:

## 2020-08-19 DIAGNOSIS — G4733 Obstructive sleep apnea (adult) (pediatric): Secondary | ICD-10-CM | POA: Diagnosis not present

## 2020-08-19 NOTE — Telephone Encounter (Signed)
Called and gave verbals per request

## 2020-08-21 ENCOUNTER — Other Ambulatory Visit: Payer: Self-pay | Admitting: Registered Nurse

## 2020-08-21 ENCOUNTER — Ambulatory Visit: Payer: BC Managed Care – PPO | Admitting: Registered Nurse

## 2020-08-21 ENCOUNTER — Telehealth: Payer: Self-pay

## 2020-08-21 DIAGNOSIS — I110 Hypertensive heart disease with heart failure: Secondary | ICD-10-CM | POA: Diagnosis not present

## 2020-08-21 DIAGNOSIS — L03119 Cellulitis of unspecified part of limb: Secondary | ICD-10-CM

## 2020-08-21 DIAGNOSIS — I2510 Atherosclerotic heart disease of native coronary artery without angina pectoris: Secondary | ICD-10-CM | POA: Diagnosis not present

## 2020-08-21 DIAGNOSIS — I252 Old myocardial infarction: Secondary | ICD-10-CM | POA: Diagnosis not present

## 2020-08-21 DIAGNOSIS — I5042 Chronic combined systolic (congestive) and diastolic (congestive) heart failure: Secondary | ICD-10-CM | POA: Diagnosis not present

## 2020-08-21 DIAGNOSIS — G8929 Other chronic pain: Secondary | ICD-10-CM | POA: Diagnosis not present

## 2020-08-21 DIAGNOSIS — F419 Anxiety disorder, unspecified: Secondary | ICD-10-CM | POA: Diagnosis not present

## 2020-08-21 DIAGNOSIS — N400 Enlarged prostate without lower urinary tract symptoms: Secondary | ICD-10-CM | POA: Diagnosis not present

## 2020-08-21 DIAGNOSIS — J9611 Chronic respiratory failure with hypoxia: Secondary | ICD-10-CM | POA: Diagnosis not present

## 2020-08-21 DIAGNOSIS — M48061 Spinal stenosis, lumbar region without neurogenic claudication: Secondary | ICD-10-CM | POA: Diagnosis not present

## 2020-08-21 DIAGNOSIS — I255 Ischemic cardiomyopathy: Secondary | ICD-10-CM | POA: Diagnosis not present

## 2020-08-21 DIAGNOSIS — M1991 Primary osteoarthritis, unspecified site: Secondary | ICD-10-CM | POA: Diagnosis not present

## 2020-08-21 DIAGNOSIS — E785 Hyperlipidemia, unspecified: Secondary | ICD-10-CM | POA: Diagnosis not present

## 2020-08-21 DIAGNOSIS — G4733 Obstructive sleep apnea (adult) (pediatric): Secondary | ICD-10-CM | POA: Diagnosis not present

## 2020-08-21 DIAGNOSIS — D751 Secondary polycythemia: Secondary | ICD-10-CM | POA: Diagnosis not present

## 2020-08-21 DIAGNOSIS — F17210 Nicotine dependence, cigarettes, uncomplicated: Secondary | ICD-10-CM | POA: Diagnosis not present

## 2020-08-21 DIAGNOSIS — F32A Depression, unspecified: Secondary | ICD-10-CM | POA: Diagnosis not present

## 2020-08-21 MED ORDER — SULFAMETHOXAZOLE-TRIMETHOPRIM 800-160 MG PO TABS
1.0000 | ORAL_TABLET | Freq: Two times a day (BID) | ORAL | 0 refills | Status: DC
Start: 2020-08-21 — End: 2020-10-04

## 2020-08-21 NOTE — Telephone Encounter (Signed)
Please advise on refill of this medication it is for his elbows.

## 2020-08-21 NOTE — Telephone Encounter (Signed)
Vitamin D, Ergocalciferol, (DRISDOL) 1.25 MG (50000 UNIT) CAPS capsule torsemide (DEMADEX) 20 MG tablet  methocarbamol (ROBAXIN) 750 MG tablet  ondansetron (ZOFRAN-ODT) 8 MG disintegrating tablet Vitamin b12 injection 1000 MCG  lactulose (CHRONULAC) 10 GM/15ML solution  naproxen (NAPROSYN) 500 MG tablet pantoprazole (PROTONIX) 40 MG tablet [place], [place] [address]  Phone: [phone] Fax: [place], Home Health aid called to have this refilled. States he is out of all of these prescriptions

## 2020-08-21 NOTE — Telephone Encounter (Signed)
Attempted to call patient to let him know that an appointment was not needed for a medication refill. [name] has seen patient multiple times in the last month.

## 2020-08-22 ENCOUNTER — Telehealth: Payer: Self-pay | Admitting: Registered Nurse

## 2020-08-22 NOTE — Telephone Encounter (Signed)
Flor PT with [facility] is calling to request an order for PT 2 times a week for 5 weeks,  She also fells he might need a ankle foot Orthosis For the RGT foot drop   the script order can be provided to pt and sent to company , but needs face to face appt .    Flor can reached [phone]

## 2020-08-23 ENCOUNTER — Other Ambulatory Visit: Payer: Self-pay

## 2020-08-23 ENCOUNTER — Ambulatory Visit (INDEPENDENT_AMBULATORY_CARE_PROVIDER_SITE_OTHER): Payer: BC Managed Care – PPO | Admitting: Registered Nurse

## 2020-08-23 ENCOUNTER — Encounter: Payer: Self-pay | Admitting: Registered Nurse

## 2020-08-23 ENCOUNTER — Telehealth: Payer: Self-pay

## 2020-08-23 VITALS — BP 98/65 | HR 85 | Temp 98.7°F | Resp 18 | Ht 71.0 in

## 2020-08-23 DIAGNOSIS — R1114 Bilious vomiting: Secondary | ICD-10-CM | POA: Diagnosis not present

## 2020-08-23 DIAGNOSIS — R110 Nausea: Secondary | ICD-10-CM

## 2020-08-23 DIAGNOSIS — N529 Male erectile dysfunction, unspecified: Secondary | ICD-10-CM

## 2020-08-23 DIAGNOSIS — R634 Abnormal weight loss: Secondary | ICD-10-CM

## 2020-08-23 DIAGNOSIS — R5383 Other fatigue: Secondary | ICD-10-CM

## 2020-08-23 MED ORDER — ONDANSETRON 8 MG PO TBDP: 8.0000 mg | ORAL_TABLET | Freq: Three times a day (TID) | ORAL | 1 refills | Status: DC | PRN

## 2020-08-23 NOTE — Telephone Encounter (Signed)
Pt. Called requesting ciallis be prescribed by Mr. Morrow. Pt. Saw NP Orland Mustard recently and asserts they discussed it at the time of meeting

## 2020-08-23 NOTE — Patient Instructions (Signed)
° ° ° °  If you have lab work done today you will be contacted with your lab results within the next 2 weeks.  If you have not heard from us then please contact us. The fastest way to get your results is to register for My Chart. ° ° °IF you received an x-ray today, you will receive an invoice from [facility]. Please contact [facility] at [phone] with questions or concerns regarding your invoice.  ° °IF you received labwork today, you will receive an invoice from [facility]. Please contact [facility] at [phone] with questions or concerns regarding your invoice.  ° °Our billing staff will not be able to assist you with questions regarding bills from these companies. ° °You will be contacted with the lab results as soon as they are available. The fastest way to get your results is to activate your My Chart account. Instructions are located on the last page of this paperwork. If you have not heard from us regarding the results in 2 weeks, please contact this office. °  ° ° ° °

## 2020-08-23 NOTE — Telephone Encounter (Signed)
Pt needs referral to podiatry for orthotics. For Rt foot and ankle

## 2020-08-24 LAB — CBC WITH DIFFERENTIAL
Basophils Absolute: 0.1 10*3/uL (ref 0.0–0.2)
Basos: 1 %
EOS (ABSOLUTE): 0.2 10*3/uL (ref 0.0–0.4)
Eos: 3 %
Hematocrit: 52 % — ABNORMAL HIGH (ref 37.5–51.0)
Hemoglobin: 17.9 g/dL — ABNORMAL HIGH (ref 13.0–17.7)
Immature Grans (Abs): 0 10*3/uL (ref 0.0–0.1)
Immature Granulocytes: 0 %
Lymphocytes Absolute: 1.4 10*3/uL (ref 0.7–3.1)
Lymphs: 19 %
MCH: 32.6 pg (ref 26.6–33.0)
MCHC: 34.4 g/dL (ref 31.5–35.7)
MCV: 95 fL (ref 79–97)
Monocytes Absolute: 0.5 10*3/uL (ref 0.1–0.9)
Monocytes: 6 %
Neutrophils Absolute: 5.3 10*3/uL (ref 1.4–7.0)
Neutrophils: 71 %
RBC: 5.49 x10E6/uL (ref 4.14–5.80)
RDW: 13.4 % (ref 11.6–15.4)
WBC: 7.4 10*3/uL (ref 3.4–10.8)

## 2020-08-24 LAB — TSH: TSH: 0.554 u[IU]/mL (ref 0.450–4.500)

## 2020-08-24 LAB — LIPID PANEL
Chol/HDL Ratio: 5 ratio (ref 0.0–5.0)
Cholesterol, Total: 199 mg/dL (ref 100–199)
HDL: 40 mg/dL (ref >39)
LDL Chol Calc (NIH): 129 mg/dL — ABNORMAL HIGH (ref 0–99)
Triglycerides: 170 mg/dL — ABNORMAL HIGH (ref 0–149)
VLDL Cholesterol Cal: 30 mg/dL (ref 5–40)

## 2020-08-24 LAB — COMPREHENSIVE METABOLIC PANEL
ALT: 7 IU/L (ref 0–44)
AST: 13 IU/L (ref 0–40)
Albumin/Globulin Ratio: 1.6 (ref 1.2–2.2)
Albumin: 4.7 g/dL (ref 3.8–4.9)
Alkaline Phosphatase: 95 IU/L (ref 44–121)
BUN/Creatinine Ratio: 15 (ref 10–24)
BUN: 17 mg/dL (ref 8–27)
Bilirubin Total: 0.6 mg/dL (ref 0.0–1.2)
CO2: 28 mmol/L (ref 20–29)
Calcium: 9.9 mg/dL (ref 8.6–10.2)
Chloride: 90 mmol/L — ABNORMAL LOW (ref 96–106)
Creatinine, Ser: 1.1 mg/dL (ref 0.76–1.27)
GFR calc Af Amer: 84 mL/min/{1.73_m2} (ref >59)
GFR calc non Af Amer: 73 mL/min/{1.73_m2} (ref >59)
Globulin, Total: 2.9 g/dL (ref 1.5–4.5)
Glucose: 111 mg/dL — ABNORMAL HIGH (ref 65–99)
Potassium: 3.7 mmol/L (ref 3.5–5.2)
Sodium: 135 mmol/L (ref 134–144)
Total Protein: 7.6 g/dL (ref 6.0–8.5)

## 2020-08-24 LAB — VITAMIN D 25 HYDROXY (VIT D DEFICIENCY, FRACTURES): Vit D, 25-Hydroxy: 12.6 ng/mL — ABNORMAL LOW (ref 30.0–100.0)

## 2020-08-24 LAB — HEMOGLOBIN A1C
Est. average glucose Bld gHb Est-mCnc: 120 mg/dL
Hgb A1c MFr Bld: 5.8 % — ABNORMAL HIGH (ref 4.8–5.6)

## 2020-08-24 LAB — RPR: RPR Ser Ql: NONREACTIVE

## 2020-08-24 LAB — VITAMIN B12: Vitamin B-12: 247 pg/mL (ref 232–1245)

## 2020-08-24 LAB — HIV ANTIBODY (ROUTINE TESTING W REFLEX): HIV Screen 4th Generation wRfx: NONREACTIVE

## 2020-08-24 LAB — C-REACTIVE PROTEIN: CRP: 3 mg/L (ref 0–10)

## 2020-08-26 ENCOUNTER — Other Ambulatory Visit: Payer: Self-pay | Admitting: Registered Nurse

## 2020-08-26 DIAGNOSIS — E559 Vitamin D deficiency, unspecified: Secondary | ICD-10-CM

## 2020-08-26 MED ORDER — TADALAFIL 20 MG PO TABS: 10.0000 mg | ORAL_TABLET | ORAL | 11 refills | Status: DC | PRN

## 2020-08-26 MED ORDER — VITAMIN D (ERGOCALCIFEROL) 1.25 MG (50000 UNIT) PO CAPS: 50000.0000 [IU] | ORAL_CAPSULE | ORAL | 1 refills | Status: DC

## 2020-08-26 NOTE — Telephone Encounter (Signed)
Pt reports you were to rx him cialis after last visit. No notes of ED or other condition discussed for this please advise

## 2020-08-27 DIAGNOSIS — F419 Anxiety disorder, unspecified: Secondary | ICD-10-CM | POA: Diagnosis not present

## 2020-08-27 DIAGNOSIS — D751 Secondary polycythemia: Secondary | ICD-10-CM | POA: Diagnosis not present

## 2020-08-27 DIAGNOSIS — I110 Hypertensive heart disease with heart failure: Secondary | ICD-10-CM | POA: Diagnosis not present

## 2020-08-27 DIAGNOSIS — M48061 Spinal stenosis, lumbar region without neurogenic claudication: Secondary | ICD-10-CM | POA: Diagnosis not present

## 2020-08-27 DIAGNOSIS — E785 Hyperlipidemia, unspecified: Secondary | ICD-10-CM | POA: Diagnosis not present

## 2020-08-27 DIAGNOSIS — I2510 Atherosclerotic heart disease of native coronary artery without angina pectoris: Secondary | ICD-10-CM | POA: Diagnosis not present

## 2020-08-27 DIAGNOSIS — I255 Ischemic cardiomyopathy: Secondary | ICD-10-CM | POA: Diagnosis not present

## 2020-08-27 DIAGNOSIS — F17210 Nicotine dependence, cigarettes, uncomplicated: Secondary | ICD-10-CM | POA: Diagnosis not present

## 2020-08-27 DIAGNOSIS — N400 Enlarged prostate without lower urinary tract symptoms: Secondary | ICD-10-CM | POA: Diagnosis not present

## 2020-08-27 DIAGNOSIS — M1991 Primary osteoarthritis, unspecified site: Secondary | ICD-10-CM | POA: Diagnosis not present

## 2020-08-27 DIAGNOSIS — F32A Depression, unspecified: Secondary | ICD-10-CM | POA: Diagnosis not present

## 2020-08-27 DIAGNOSIS — G4733 Obstructive sleep apnea (adult) (pediatric): Secondary | ICD-10-CM | POA: Diagnosis not present

## 2020-08-27 DIAGNOSIS — J9611 Chronic respiratory failure with hypoxia: Secondary | ICD-10-CM | POA: Diagnosis not present

## 2020-08-27 DIAGNOSIS — G8929 Other chronic pain: Secondary | ICD-10-CM | POA: Diagnosis not present

## 2020-08-27 DIAGNOSIS — I252 Old myocardial infarction: Secondary | ICD-10-CM | POA: Diagnosis not present

## 2020-08-27 DIAGNOSIS — I5042 Chronic combined systolic (congestive) and diastolic (congestive) heart failure: Secondary | ICD-10-CM | POA: Diagnosis not present

## 2020-08-30 DIAGNOSIS — J9611 Chronic respiratory failure with hypoxia: Secondary | ICD-10-CM | POA: Diagnosis not present

## 2020-08-30 DIAGNOSIS — M48061 Spinal stenosis, lumbar region without neurogenic claudication: Secondary | ICD-10-CM | POA: Diagnosis not present

## 2020-08-30 DIAGNOSIS — I110 Hypertensive heart disease with heart failure: Secondary | ICD-10-CM | POA: Diagnosis not present

## 2020-08-30 DIAGNOSIS — M1991 Primary osteoarthritis, unspecified site: Secondary | ICD-10-CM | POA: Diagnosis not present

## 2020-08-30 DIAGNOSIS — I252 Old myocardial infarction: Secondary | ICD-10-CM | POA: Diagnosis not present

## 2020-08-30 DIAGNOSIS — I2510 Atherosclerotic heart disease of native coronary artery without angina pectoris: Secondary | ICD-10-CM | POA: Diagnosis not present

## 2020-08-30 DIAGNOSIS — G8929 Other chronic pain: Secondary | ICD-10-CM | POA: Diagnosis not present

## 2020-08-30 DIAGNOSIS — F17210 Nicotine dependence, cigarettes, uncomplicated: Secondary | ICD-10-CM | POA: Diagnosis not present

## 2020-08-30 DIAGNOSIS — E785 Hyperlipidemia, unspecified: Secondary | ICD-10-CM | POA: Diagnosis not present

## 2020-08-30 DIAGNOSIS — F419 Anxiety disorder, unspecified: Secondary | ICD-10-CM | POA: Diagnosis not present

## 2020-08-30 DIAGNOSIS — I255 Ischemic cardiomyopathy: Secondary | ICD-10-CM | POA: Diagnosis not present

## 2020-08-30 DIAGNOSIS — F32A Depression, unspecified: Secondary | ICD-10-CM | POA: Diagnosis not present

## 2020-08-30 DIAGNOSIS — D751 Secondary polycythemia: Secondary | ICD-10-CM | POA: Diagnosis not present

## 2020-08-30 DIAGNOSIS — G4733 Obstructive sleep apnea (adult) (pediatric): Secondary | ICD-10-CM | POA: Diagnosis not present

## 2020-08-30 DIAGNOSIS — N400 Enlarged prostate without lower urinary tract symptoms: Secondary | ICD-10-CM | POA: Diagnosis not present

## 2020-08-30 DIAGNOSIS — I5042 Chronic combined systolic (congestive) and diastolic (congestive) heart failure: Secondary | ICD-10-CM | POA: Diagnosis not present

## 2020-09-02 DIAGNOSIS — I5042 Chronic combined systolic (congestive) and diastolic (congestive) heart failure: Secondary | ICD-10-CM | POA: Diagnosis not present

## 2020-09-02 DIAGNOSIS — I2510 Atherosclerotic heart disease of native coronary artery without angina pectoris: Secondary | ICD-10-CM | POA: Diagnosis not present

## 2020-09-02 DIAGNOSIS — G4733 Obstructive sleep apnea (adult) (pediatric): Secondary | ICD-10-CM | POA: Diagnosis not present

## 2020-09-02 DIAGNOSIS — F419 Anxiety disorder, unspecified: Secondary | ICD-10-CM | POA: Diagnosis not present

## 2020-09-02 DIAGNOSIS — M48061 Spinal stenosis, lumbar region without neurogenic claudication: Secondary | ICD-10-CM | POA: Diagnosis not present

## 2020-09-02 DIAGNOSIS — D751 Secondary polycythemia: Secondary | ICD-10-CM | POA: Diagnosis not present

## 2020-09-02 DIAGNOSIS — G8929 Other chronic pain: Secondary | ICD-10-CM | POA: Diagnosis not present

## 2020-09-02 DIAGNOSIS — I110 Hypertensive heart disease with heart failure: Secondary | ICD-10-CM | POA: Diagnosis not present

## 2020-09-02 DIAGNOSIS — I252 Old myocardial infarction: Secondary | ICD-10-CM | POA: Diagnosis not present

## 2020-09-02 DIAGNOSIS — F4322 Adjustment disorder with anxiety: Secondary | ICD-10-CM | POA: Diagnosis not present

## 2020-09-02 DIAGNOSIS — I255 Ischemic cardiomyopathy: Secondary | ICD-10-CM | POA: Diagnosis not present

## 2020-09-02 DIAGNOSIS — F32A Depression, unspecified: Secondary | ICD-10-CM | POA: Diagnosis not present

## 2020-09-02 DIAGNOSIS — F17210 Nicotine dependence, cigarettes, uncomplicated: Secondary | ICD-10-CM | POA: Diagnosis not present

## 2020-09-02 DIAGNOSIS — M1991 Primary osteoarthritis, unspecified site: Secondary | ICD-10-CM | POA: Diagnosis not present

## 2020-09-02 DIAGNOSIS — E785 Hyperlipidemia, unspecified: Secondary | ICD-10-CM | POA: Diagnosis not present

## 2020-09-02 DIAGNOSIS — N400 Enlarged prostate without lower urinary tract symptoms: Secondary | ICD-10-CM | POA: Diagnosis not present

## 2020-09-02 DIAGNOSIS — J9611 Chronic respiratory failure with hypoxia: Secondary | ICD-10-CM | POA: Diagnosis not present

## 2020-09-04 ENCOUNTER — Telehealth: Payer: Self-pay

## 2020-09-04 ENCOUNTER — Ambulatory Visit (INDEPENDENT_AMBULATORY_CARE_PROVIDER_SITE_OTHER): Payer: BC Managed Care – PPO

## 2020-09-04 DIAGNOSIS — I255 Ischemic cardiomyopathy: Secondary | ICD-10-CM

## 2020-09-04 NOTE — Telephone Encounter (Signed)
Pt. Called to inform the practice his insurance was requesting the referral to home health be resent. Pt. Was unable to specify the issue necessitating this resend.

## 2020-09-05 LAB — CUP PACEART REMOTE DEVICE CHECK
Battery Remaining Longevity: 7 mo
Battery Remaining Percentage: 7 %
Battery Voltage: 2.65 V
Brady Statistic AP VP Percent: 1 %
Brady Statistic AP VS Percent: 1 %
Brady Statistic AS VP Percent: 1 %
Brady Statistic AS VS Percent: 99 %
Brady Statistic RA Percent Paced: 1 %
Brady Statistic RV Percent Paced: 1 %
Date Time Interrogation Session: 20220223045250
HighPow Impedance: 70 Ohm
HighPow Impedance: 70 Ohm
Implantable Lead Implant Date: 20120201
Implantable Lead Implant Date: 20120201
Implantable Lead Location: 753859
Implantable Lead Location: 753860
Implantable Pulse Generator Implant Date: 20120201
Lead Channel Impedance Value: 340 Ohm
Lead Channel Impedance Value: 480 Ohm
Lead Channel Pacing Threshold Amplitude: 0.75 V
Lead Channel Pacing Threshold Amplitude: 1.75 V
Lead Channel Pacing Threshold Pulse Width: 0.5 ms
Lead Channel Pacing Threshold Pulse Width: 0.5 ms
Lead Channel Sensing Intrinsic Amplitude: 12 mV
Lead Channel Sensing Intrinsic Amplitude: 5 mV
Lead Channel Setting Pacing Amplitude: 2 V
Lead Channel Setting Pacing Amplitude: 3 V
Lead Channel Setting Pacing Pulse Width: 0.5 ms
Lead Channel Setting Sensing Sensitivity: 0.5 mV
Pulse Gen Serial Number: 815096

## 2020-09-05 NOTE — Telephone Encounter (Signed)
Pt needs referral to be resent please

## 2020-09-10 ENCOUNTER — Other Ambulatory Visit: Payer: Self-pay

## 2020-09-10 ENCOUNTER — Telehealth: Payer: Self-pay | Admitting: Registered Nurse

## 2020-09-10 ENCOUNTER — Encounter: Payer: Self-pay | Admitting: Registered Nurse

## 2020-09-10 ENCOUNTER — Ambulatory Visit (INDEPENDENT_AMBULATORY_CARE_PROVIDER_SITE_OTHER): Payer: BC Managed Care – PPO | Admitting: Registered Nurse

## 2020-09-10 VITALS — BP 95/71 | HR 75 | Temp 98.0°F | Resp 18 | Ht 71.0 in | Wt 171.0 lb

## 2020-09-10 DIAGNOSIS — N529 Male erectile dysfunction, unspecified: Secondary | ICD-10-CM | POA: Diagnosis not present

## 2020-09-10 DIAGNOSIS — M7021 Olecranon bursitis, right elbow: Secondary | ICD-10-CM | POA: Diagnosis not present

## 2020-09-10 DIAGNOSIS — M7022 Olecranon bursitis, left elbow: Secondary | ICD-10-CM

## 2020-09-10 MED ORDER — SILDENAFIL CITRATE 100 MG PO TABS: 50.0000 mg | ORAL_TABLET | Freq: Every day | ORAL | 0 refills | Status: DC | PRN

## 2020-09-10 NOTE — Telephone Encounter (Signed)
Called pharmacy pt has had too many rx fills for too many of similar type of medication and insurance will not pay for more than 4 in 30 days without a PA of medical necessity otherwise this is out of pocket

## 2020-09-10 NOTE — Patient Instructions (Signed)
° ° ° °  If you have lab work done today you will be contacted with your lab results within the next 2 weeks.  If you have not heard from us then please contact us. The fastest way to get your results is to register for My Chart. ° ° °IF you received an x-ray today, you will receive an invoice from [facility]. Please contact [facility] at [phone] with questions or concerns regarding your invoice.  ° °IF you received labwork today, you will receive an invoice from [facility]. Please contact [facility] at [phone] with questions or concerns regarding your invoice.  ° °Our billing staff will not be able to assist you with questions regarding bills from these companies. ° °You will be contacted with the lab results as soon as they are available. The fastest way to get your results is to activate your My Chart account. Instructions are located on the last page of this paperwork. If you have not heard from us regarding the results in 2 weeks, please contact this office. °  ° ° ° °

## 2020-09-10 NOTE — Progress Notes (Signed)
Acute Office Visit  Subjective:    Patient ID: [name], male    DOB: Sep 07, 1960, 60 y.o.   MRN: [id number]  Chief Complaint  Patient presents with  . Follow-up    Patient states he is having problems with both elbows.    HPI Patient is in today for olecranon bursitis  Recurrent Has ongoing trauma to elbows as he often rests them on arms of chair Falls frequently at home, mechanical and pathologic related to his many medical concerns Somewhat painful, unlike previous occasions No heat, redness, or systemic symptoms.  Also requesting letter stating he may receive flexible dosing for his methadone - with his mobility issues he has trouble getting in and out of the clinic. Maintains his monthly follow ups for drug screening with provider at that location.  Requests change from tadalafil to sildenafil  Past Medical History:  Diagnosis Date  . Anxiety   . Automatic implantable cardiac defibrillator [employer]    Analyze ST  . Benign prostatic hypertrophy   . Benzodiazepine dependence (HCC)    chronic  . CAD (coronary artery disease)    Last cath 2/12. 3-v CAD. Failed PCI of distal RCAc  CATH [facility] 4/13 with DES to  LAD X2  . CHF (congestive heart failure) ([place])   . Chronic back pain    lumbar stenosis  . Chronic systolic heart failure (HCC)    EF 20-25%. s/p ST[employer] Jude ICD  . Depression   . DJD (degenerative joint disease)   . History of testicular cancer   . Ischemic cardiomyopathy   . Myocardial infarction ([place])   . Narcotic dependence (HCC)    chronic  . OSA on CPAP   . Polycythemia, secondary    S/p hematology consultation for polycythema on 01/06/18.  S/p bone marrow biopsy on 12/31/17 that was negative for myeloproliferative disorder.  Feels secondary to sleep apnea, active smoking, testosterone supplementation, nocturnal hypoxia  . TIA (transient ischemic attack)   . Vitamin B12 deficiency 07/22/2016    Past Surgical History:  Procedure Laterality Date  . ASD  REPAIR, SINUS VENOSUS    . CARDIAC DEFIBRILLATOR PLACEMENT  08/2010  . HERNIA REPAIR    . LEFT HEART CATHETERIZATION WITH CORONARY ANGIOGRAM N/A 06/14/2014   Procedure: LEFT HEART CATHETERIZATION WITH CORONARY ANGIOGRAM;  Surgeon: Jolaine Artist, MD;  Location: [facility] CATH LAB;  Service: Cardiovascular;  Laterality: N/A;  . TESTICLE SURGERY     testicular cancer surgery left testicle removed    Family History  Problem Relation Age of Onset  . Heart disease Father 73       Died of MI  . Cancer Mother     Social History   Socioeconomic History  . Marital status: Divorced    Spouse name: Not on file  . Number of children: 4  . Years of education: Not on file  . Highest education level: Not on file  Occupational History  . Not on file  Tobacco Use  . Smoking status: Current Every Day Smoker    Packs/day: 0.50    Years: 29.00    Pack years: 14.50    Types: Cigarettes, E-cigarettes    Last attempt to quit: 07/14/2015    Years since quitting: 5.1  . Smokeless tobacco: Never Used  . Tobacco comment: no cigarette in 4 days but yes to e-cig  Vaping Use  . Vaping Use: Every day  . Start date: 07/14/2015  Substance and Sexual Activity  . Alcohol use:  No    Alcohol/week: 0.0 standard drinks  . Drug use: Yes    Types: Marijuana    Comment: Urine showed THC  . Sexual activity: Yes    Partners: Male  Other Topics Concern  . Not on file  Social History Narrative   Marital status: married x 12 years; wife is severe alcoholic.      Children: 3 married daughters (23, 53, 43); 1 son (77yo); 3 grandchildren born in 2017      Lives: with wife, son      Left-handed   Caffeine: 3 drinks per day   Social Determinants of Health   Financial Resource Strain: Not on file  Food Insecurity: Not on file  Transportation Needs: Not on file  Physical Activity: Not on file  Stress: Not on file  Social Connections: Not on file  Intimate Partner Violence: Not on file    Outpatient Medications  Prior to Visit  Medication Sig Dispense Refill  . albuterol (VENTOLIN HFA) 108 (90 Base) MCG/ACT inhaler Inhale 2 puffs into the lungs every 6 (six) hours as needed for wheezing or shortness of breath. 18 g 5  . ALPRAZolam (XANAX) 1 MG tablet Take 1 mg by mouth 4 (four) times daily.     . carvedilol (COREG) 25 MG tablet Take 25 mg by mouth 2 (two) times daily with a meal.    . clopidogrel (PLAVIX) 75 MG tablet TAKE 1 TABLET EACH DAY. 90 tablet 0  . cyanocobalamin (,VITAMIN B-12,) 1000 MCG/ML injection Inject 1 mL (1,000 mcg total) into the muscle every 30 (thirty) days. 1 mL 11  . dapagliflozin propanediol (FARXIGA) 10 MG TABS tablet Take 10 mg by mouth daily before breakfast. 30 tablet 6  . diazepam (VALIUM) 10 MG tablet Take 20 mg by mouth every 12 (twelve) hours as needed for anxiety.   2  . diclofenac (VOLTAREN) 75 MG EC tablet Take 1 tablet (75 mg total) by mouth 2 (two) times daily. 20 tablet 0  . doxycycline (VIBRAMYCIN) 100 MG capsule Take 1 capsule (100 mg total) by mouth 2 (two) times daily. 14 capsule 0  . erythromycin ophthalmic ointment Place 1 application into the right eye at bedtime. 3.5 g 1  . ezetimibe (ZETIA) 10 MG tablet TAKE 1 TABLET EACH DAY. 90 tablet 0  . ibuprofen (ADVIL,MOTRIN) 200 MG tablet Take 800 mg by mouth every 6 (six) hours as needed for moderate pain.    Marland Kitchen lactulose (CHRONULAC) 10 GM/15ML solution Take 15 mLs (10 g total) by mouth 2 (two) times daily as needed for mild constipation. 1892 mL 0  . lisinopril (PRINIVIL,ZESTRIL) 5 MG tablet Take 5 mg by mouth daily.    Marland Kitchen loperamide (IMODIUM A-D) 2 MG tablet Take 1.5 tablets (3 mg total) by mouth 4 (four) times daily as needed for diarrhea or loose stools. 30 tablet 2  . losartan (COZAAR) 25 MG tablet Take 1 tablet (25 mg total) by mouth daily. 90 tablet 3  . methadone (METHADOSE) 40 MG disintegrating tablet Take 200 mg by mouth daily.    . methocarbamol (ROBAXIN) 750 MG tablet TAKE 1 OR 2 TABLETS THREE TIMES A DAY AS  NEEDED. 180 tablet 5  . naproxen (NAPROSYN) 500 MG tablet Take 1 tablet (500 mg total) by mouth 2 (two) times daily as needed for moderate pain. 60 tablet 2  . nitroGLYCERIN (NITROLINGUAL) 0.4 MG/SPRAY spray Place 2 sprays under the tongue every 5 (five) minutes x 3 doses as needed for chest pain.     Marland Kitchen  ondansetron (ZOFRAN-ODT) 8 MG disintegrating tablet Take 1 tablet (8 mg total) by mouth every 8 (eight) hours as needed for nausea or vomiting. 90 tablet 1  . oxymetazoline (AFRIN) 0.05 % nasal spray Place 1 spray into both nostrils 2 (two) times daily.    . pantoprazole (PROTONIX) 40 MG tablet Take 1 tablet (40 mg total) by mouth daily. 90 tablet 1  . polyethylene glycol powder (GLYCOLAX/MIRALAX) 17 GM/SCOOP powder Take 17 g by mouth 2 (two) times daily as needed for mild constipation.    . rosuvastatin (CRESTOR) 40 MG tablet Take 1 tablet (40 mg total) by mouth every evening. 30 tablet 2  . sildenafil (REVATIO) 20 MG tablet TAKE 5 TABLETS AS NEEDED. 90 tablet 0  . spironolactone (ALDACTONE) 25 MG tablet TAKE (1/2) TABLET DAILY. (Patient taking differently: 25 mg. TAKE (1/2) TABLET DAILY.) 45 tablet 1  . sucralfate (CARAFATE) 1 g tablet Take 1 tablet (1 g total) by mouth 4 (four) times daily -  with meals and at bedtime. 120 tablet 0  . sulfamethoxazole-trimethoprim (BACTRIM DS) 800-160 MG tablet Take 1 tablet by mouth 2 (two) times daily. 14 tablet 0  . tamsulosin (FLOMAX) 0.4 MG CAPS capsule Take 1 capsule (0.4 mg total) by mouth daily after breakfast. 90 capsule 0  . TESTOSTERONE CYPIONATE IM Inject into the muscle every 14 (fourteen) days.    Marland Kitchen torsemide (DEMADEX) 20 MG tablet Take 1 tablet (20 mg total) by mouth daily as needed. For swelling in legs 90 tablet 2  . traZODone (DESYREL) 100 MG tablet Take 1 tablet (100 mg total) by mouth at bedtime. 90 tablet 0  . triamcinolone (NASACORT AQ) 55 MCG/ACT AERO nasal inhaler Place 1 spray into the nose daily. 1 Inhaler 12  . Vitamin D, Ergocalciferol,  (DRISDOL) 1.25 MG (50000 UNIT) CAPS capsule Take 1 capsule (50,000 Units total) by mouth every 7 (seven) days. 12 capsule 1  . zolpidem (AMBIEN) 10 MG tablet Take 10 mg by mouth at bedtime.     . tadalafil (CIALIS) 20 MG tablet Take 0.5-1 tablets (10-20 mg total) by mouth every other day as needed for erectile dysfunction. 30 tablet 11  . clonazePAM (KLONOPIN) 1 MG tablet Take 1 mg by mouth 3 (three) times daily as needed. (Patient not taking: No sig reported)     No facility-administered medications prior to visit.    Allergies  Allergen Reactions  . Darvocet [Propoxyphene N-Acetaminophen] Anaphylaxis    Throat closes    Review of Systems Per hpi      Objective:    Physical Exam Vitals and nursing note reviewed.  Constitutional:      Appearance: Normal appearance.  Cardiovascular:     Rate and Rhythm: Normal rate and regular rhythm.     Pulses: Normal pulses.     Heart sounds: Normal heart sounds. No murmur heard. No friction rub. No gallop.   Pulmonary:     Effort: Pulmonary effort is normal. No respiratory distress.     Breath sounds: Normal breath sounds. No stridor. No wheezing, rhonchi or rales.  Musculoskeletal:        General: Swelling (bilateral olecranon bursitis) present.  Neurological:     General: No focal deficit present.     Mental Status: He is alert. Mental status is at baseline.  Psychiatric:        Mood and Affect: Mood normal.        Behavior: Behavior normal.        Thought Content: Thought  content normal.        Judgment: Judgment normal.     BP 95/71   Pulse 75   Temp 98 F (36.7 C) (Temporal)   Resp 18   Ht _0  (1.803 m)   Wt 171 lb (77.6 kg)   SpO2 99%   BMI 23.85 kg/m  Wt Readings from Last 3 Encounters:  09/10/20 171 lb (77.6 kg)  07/19/20 171 lb (77.6 kg)  02/20/20 163 lb (73.9 kg)    There are no preventive care reminders to display for this patient.  There are no preventive care reminders to display for this  patient.   Lab Results  Component Value Date   TSH 0.554 08/23/2020   Lab Results  Component Value Date   WBC 7.4 08/23/2020   HGB 17.9 (H) 08/23/2020   HCT 52.0 (H) 08/23/2020   MCV 95 08/23/2020   PLT 143 (L) 11/21/2019   Lab Results  Component Value Date   NA 135 08/23/2020   K 3.7 08/23/2020   CO2 28 08/23/2020   GLUCOSE 111 (H) 08/23/2020   BUN 17 08/23/2020   CREATININE 1.10 08/23/2020   BILITOT 0.6 08/23/2020   ALKPHOS 95 08/23/2020   AST 13 08/23/2020   ALT 7 08/23/2020   PROT 7.6 08/23/2020   ALBUMIN 4.7 08/23/2020   CALCIUM 9.9 08/23/2020   ANIONGAP 11 01/09/2019   GFR 84.75 01/08/2017   Lab Results  Component Value Date   CHOL 199 08/23/2020   Lab Results  Component Value Date   HDL 40 08/23/2020   Lab Results  Component Value Date   LDLCALC 129 (H) 08/23/2020   Lab Results  Component Value Date   TRIG 170 (H) 08/23/2020   Lab Results  Component Value Date   CHOLHDL 5.0 08/23/2020   Lab Results  Component Value Date   HGBA1C 5.8 (H) 08/23/2020       Assessment & Plan:   Problem List Items Addressed This Visit      Other   Erectile dysfunction   Relevant Medications   sildenafil (VIAGRA) 100 MG tablet    Other Visit Diagnoses    Olecranon bursitis of both elbows    -  Primary   Relevant Orders   Synovial fluid, cell count   CBC With Differential   Pathologist smear review       Meds ordered this encounter  Medications  . sildenafil (VIAGRA) 100 MG tablet    Sig: Take 0.5 tablets (50 mg total) by mouth daily as needed for erectile dysfunction.    Dispense:  30 tablet    Refill:  0    Order Specific Question:   Supervising Provider    Answer:   Carlota Raspberry, JEFFREY R [[id number]]   PLAN  Drained bursa. Thin red fluid, likely mostly blood. Less viscous than previously. Will send for analysis.  Discussed prevention of bursitis.  Cbc and path smear review ordered given recurrence. May consider referral if concerns arise.  Order  sildenafil  Letter written and given to patient  Patient encouraged to call clinic with any questions, comments, or concerns.  I spent 46 minutes with this patient, more than 50% of which was spent counseling and/or educating.  [name], NP

## 2020-09-10 NOTE — Telephone Encounter (Signed)
Called pt and informed he states he will pay cash like normal

## 2020-09-10 NOTE — Telephone Encounter (Signed)
Pt is stating he did not get 30 pills of sildenafil (VIAGRA) 100 MG tablet [[id number]]. They are telling him the script is only for 4 pills.  This is what the pharmacy is telling him. It does state we sent over a script today for 30 pills. Please clear this up with pharmacy.

## 2020-09-12 LAB — CBC WITH DIFFERENTIAL
Basophils Absolute: 0 10*3/uL (ref 0.0–0.2)
Basos: 1 %
EOS (ABSOLUTE): 0.2 10*3/uL (ref 0.0–0.4)
Eos: 3 %
Hematocrit: 45.2 % (ref 37.5–51.0)
Hemoglobin: 15.1 g/dL (ref 13.0–17.7)
Immature Grans (Abs): 0 10*3/uL (ref 0.0–0.1)
Immature Granulocytes: 0 %
Lymphocytes Absolute: 1.2 10*3/uL (ref 0.7–3.1)
Lymphs: 19 %
MCH: 32 pg (ref 26.6–33.0)
MCHC: 33.4 g/dL (ref 31.5–35.7)
MCV: 96 fL (ref 79–97)
Monocytes Absolute: 0.7 10*3/uL (ref 0.1–0.9)
Monocytes: 11 %
Neutrophils Absolute: 4.1 10*3/uL (ref 1.4–7.0)
Neutrophils: 66 %
RBC: 4.72 x10E6/uL (ref 4.14–5.80)
RDW: 13.5 % (ref 11.6–15.4)
WBC: 6.3 10*3/uL (ref 3.4–10.8)

## 2020-09-12 LAB — PATHOLOGIST SMEAR REVIEW
Basophils Absolute: 0 10*3/uL (ref 0.0–0.2)
Basos: 1 %
EOS (ABSOLUTE): 0.2 10*3/uL (ref 0.0–0.4)
Eos: 4 %
Hematocrit: 43.7 % (ref 37.5–51.0)
Hemoglobin: 15 g/dL (ref 13.0–17.7)
Immature Grans (Abs): 0 10*3/uL (ref 0.0–0.1)
Immature Granulocytes: 0 %
Lymphocytes Absolute: 1.2 10*3/uL (ref 0.7–3.1)
Lymphs: 19 %
MCH: 32.4 pg (ref 26.6–33.0)
MCHC: 34.3 g/dL (ref 31.5–35.7)
MCV: 94 fL (ref 79–97)
Monocytes Absolute: 0.7 10*3/uL (ref 0.1–0.9)
Monocytes: 11 %
Neutrophils Absolute: 4.4 10*3/uL (ref 1.4–7.0)
Neutrophils: 65 %
Platelets: 145 10*3/uL — ABNORMAL LOW (ref 150–450)
RBC: 4.63 x10E6/uL (ref 4.14–5.80)
RDW: 13.7 % (ref 11.6–15.4)
WBC: 6.6 10*3/uL (ref 3.4–10.8)

## 2020-09-12 LAB — SYNOVIAL FLUID, CELL COUNT
Eos, Fluid: 2 %
Lining Cells, Synovial: 0 %
Lymphs, Fluid: 31 %
Macrophages Fld: 12 %
Nuc cell # Fld: 263 cells/uL — ABNORMAL HIGH (ref 0–200)
Polys, Fluid: 55 %
RBC, Fluid: 153000 /uL

## 2020-09-12 NOTE — Progress Notes (Signed)
Remote ICD transmission.   

## 2020-09-12 NOTE — Addendum Note (Signed)
Addended by: Cheri Kearns A on: 09/12/2020 09:40 AM   Modules accepted: Level of Service

## 2020-09-13 ENCOUNTER — Telehealth: Payer: Self-pay | Admitting: Registered Nurse

## 2020-09-13 DIAGNOSIS — Z8547 Personal history of malignant neoplasm of testis: Secondary | ICD-10-CM | POA: Diagnosis not present

## 2020-09-13 DIAGNOSIS — E291 Testicular hypofunction: Secondary | ICD-10-CM | POA: Diagnosis not present

## 2020-09-13 DIAGNOSIS — R3914 Feeling of incomplete bladder emptying: Secondary | ICD-10-CM | POA: Diagnosis not present

## 2020-09-13 DIAGNOSIS — R3916 Straining to void: Secondary | ICD-10-CM | POA: Diagnosis not present

## 2020-09-13 NOTE — Telephone Encounter (Signed)
[name] is calling from [facility]  to report large amounts of Cocaine and Marijuana  Laying around in open view in the homeand they are discharging the patient as of today .  Any questions call Joycelyn Schmid at [phone]

## 2020-09-24 NOTE — Telephone Encounter (Signed)
This is disappointing from him and I understand their decision.  Thank you for the heads up  Rich

## 2020-09-25 ENCOUNTER — Other Ambulatory Visit: Payer: Self-pay

## 2020-09-25 ENCOUNTER — Encounter: Payer: Self-pay | Admitting: Registered Nurse

## 2020-09-25 ENCOUNTER — Ambulatory Visit (INDEPENDENT_AMBULATORY_CARE_PROVIDER_SITE_OTHER): Payer: BC Managed Care – PPO | Admitting: Registered Nurse

## 2020-09-25 VITALS — BP 91/62 | HR 86 | Temp 98.0°F | Resp 18 | Ht 71.0 in | Wt 174.2 lb

## 2020-09-25 DIAGNOSIS — R110 Nausea: Secondary | ICD-10-CM | POA: Diagnosis not present

## 2020-09-25 DIAGNOSIS — Z23 Encounter for immunization: Secondary | ICD-10-CM

## 2020-09-25 DIAGNOSIS — K219 Gastro-esophageal reflux disease without esophagitis: Secondary | ICD-10-CM

## 2020-09-25 DIAGNOSIS — I5042 Chronic combined systolic (congestive) and diastolic (congestive) heart failure: Secondary | ICD-10-CM

## 2020-09-25 DIAGNOSIS — J9611 Chronic respiratory failure with hypoxia: Secondary | ICD-10-CM

## 2020-09-25 DIAGNOSIS — R7989 Other specified abnormal findings of blood chemistry: Secondary | ICD-10-CM

## 2020-09-25 DIAGNOSIS — G8929 Other chronic pain: Secondary | ICD-10-CM

## 2020-09-25 DIAGNOSIS — R339 Retention of urine, unspecified: Secondary | ICD-10-CM

## 2020-09-25 DIAGNOSIS — I272 Pulmonary hypertension, unspecified: Secondary | ICD-10-CM | POA: Diagnosis not present

## 2020-09-25 DIAGNOSIS — M5441 Lumbago with sciatica, right side: Secondary | ICD-10-CM

## 2020-09-25 DIAGNOSIS — K5903 Drug induced constipation: Secondary | ICD-10-CM | POA: Diagnosis not present

## 2020-09-25 DIAGNOSIS — M5442 Lumbago with sciatica, left side: Secondary | ICD-10-CM

## 2020-09-25 DIAGNOSIS — E559 Vitamin D deficiency, unspecified: Secondary | ICD-10-CM | POA: Diagnosis not present

## 2020-09-25 DIAGNOSIS — R609 Edema, unspecified: Secondary | ICD-10-CM

## 2020-09-25 DIAGNOSIS — E785 Hyperlipidemia, unspecified: Secondary | ICD-10-CM

## 2020-09-25 DIAGNOSIS — K591 Functional diarrhea: Secondary | ICD-10-CM

## 2020-09-25 NOTE — Patient Instructions (Signed)
° ° ° °  If you have lab work done today you will be contacted with your lab results within the next 2 weeks.  If you have not heard from us then please contact us. The fastest way to get your results is to register for My Chart. ° ° °IF you received an x-ray today, you will receive an invoice from [facility]. Please contact [facility] at [phone] with questions or concerns regarding your invoice.  ° °IF you received labwork today, you will receive an invoice from [facility]. Please contact [facility] at [phone] with questions or concerns regarding your invoice.  ° °Our billing staff will not be able to assist you with questions regarding bills from these companies. ° °You will be contacted with the lab results as soon as they are available. The fastest way to get your results is to activate your My Chart account. Instructions are located on the last page of this paperwork. If you have not heard from us regarding the results in 2 weeks, please contact this office. °  ° ° ° °

## 2020-09-30 ENCOUNTER — Other Ambulatory Visit: Payer: Self-pay | Admitting: Registered Nurse

## 2020-09-30 DIAGNOSIS — N529 Male erectile dysfunction, unspecified: Secondary | ICD-10-CM

## 2020-10-01 ENCOUNTER — Other Ambulatory Visit: Payer: Self-pay

## 2020-10-01 ENCOUNTER — Other Ambulatory Visit: Payer: Self-pay | Admitting: Registered Nurse

## 2020-10-01 DIAGNOSIS — R110 Nausea: Secondary | ICD-10-CM

## 2020-10-01 MED ORDER — ONDANSETRON 8 MG PO TBDP: 8.0000 mg | ORAL_TABLET | Freq: Three times a day (TID) | ORAL | 1 refills | Status: DC | PRN

## 2020-10-01 MED ORDER — LOPERAMIDE HCL 2 MG PO TABS
3.0000 mg | ORAL_TABLET | Freq: Four times a day (QID) | ORAL | 2 refills | Status: DC | PRN
Start: 2020-10-01 — End: 2021-09-02

## 2020-10-01 MED ORDER — METHOCARBAMOL 750 MG PO TABS
ORAL_TABLET | ORAL | 5 refills | Status: DC
Start: 2020-10-01 — End: 2022-05-04

## 2020-10-01 MED ORDER — ROSUVASTATIN CALCIUM 40 MG PO TABS
40.0000 mg | ORAL_TABLET | Freq: Every evening | ORAL | 2 refills | Status: DC
Start: 2020-10-01 — End: 2022-05-04

## 2020-10-01 MED ORDER — LOSARTAN POTASSIUM 25 MG PO TABS: 25.0000 mg | ORAL_TABLET | Freq: Every day | ORAL | 3 refills | Status: DC

## 2020-10-01 MED ORDER — SILDENAFIL CITRATE 20 MG PO TABS: ORAL_TABLET | ORAL | 0 refills | Status: DC

## 2020-10-01 MED ORDER — EZETIMIBE 10 MG PO TABS: ORAL_TABLET | ORAL | 0 refills | Status: DC

## 2020-10-01 MED ORDER — SPIRONOLACTONE 25 MG PO TABS
ORAL_TABLET | ORAL | 1 refills | Status: DC
Start: 2020-10-01 — End: 2022-05-04

## 2020-10-01 MED ORDER — PANTOPRAZOLE SODIUM 40 MG PO TBEC
40.0000 mg | DELAYED_RELEASE_TABLET | Freq: Every day | ORAL | 1 refills | Status: DC
Start: 2020-10-01 — End: 2022-05-04

## 2020-10-01 MED ORDER — TRAZODONE HCL 100 MG PO TABS: 100.0000 mg | ORAL_TABLET | Freq: Every day | ORAL | 0 refills | Status: DC

## 2020-10-01 MED ORDER — VITAMIN D (ERGOCALCIFEROL) 1.25 MG (50000 UNIT) PO CAPS: 50000.0000 [IU] | ORAL_CAPSULE | ORAL | 1 refills | Status: DC

## 2020-10-01 MED ORDER — TAMSULOSIN HCL 0.4 MG PO CAPS: 0.4000 mg | ORAL_CAPSULE | Freq: Every day | ORAL | 0 refills | Status: DC

## 2020-10-01 MED ORDER — DAPAGLIFLOZIN PROPANEDIOL 10 MG PO TABS: 10.0000 mg | ORAL_TABLET | Freq: Every day | ORAL | 6 refills | Status: DC

## 2020-10-01 MED ORDER — ALBUTEROL SULFATE HFA 108 (90 BASE) MCG/ACT IN AERS: 2.0000 | INHALATION_SPRAY | Freq: Four times a day (QID) | RESPIRATORY_TRACT | 5 refills | Status: DC | PRN

## 2020-10-01 MED ORDER — TORSEMIDE 40 MG PO TABS: 20.0000 mg | ORAL_TABLET | Freq: Every day | ORAL | 3 refills | Status: DC | PRN

## 2020-10-01 MED ORDER — LACTULOSE 10 GM/15ML PO SOLN: 10.0000 g | Freq: Two times a day (BID) | ORAL | 0 refills | Status: DC | PRN

## 2020-10-01 MED ORDER — TRIAMCINOLONE ACETONIDE 55 MCG/ACT NA AERO: 1.0000 | INHALATION_SPRAY | Freq: Every day | NASAL | 12 refills | Status: DC

## 2020-10-01 MED ORDER — CLOPIDOGREL BISULFATE 75 MG PO TABS: ORAL_TABLET | ORAL | 0 refills | Status: DC

## 2020-10-01 NOTE — Telephone Encounter (Signed)
Medication Refill - Medication: trazodone 100 mg, torsemide 20 mg, tamsulosin 0.4 mg, b12 injection, ondansetron  8 mg disintegrating tablet, robaxin 750 mg,  Has the patient contacted their pharmacy? Yes was told no rx has been sent Preferred Pharmacy (with phone number or street name):[facility] [address]  Phone number [phone] Please be advised that RX refills may take up to 3 business days. We ask that you follow-up with your pharmacy.

## 2020-10-01 NOTE — Telephone Encounter (Signed)
Received a call from [name], patient's care giver, to follow up on requests for med refills. He stressed that the patient is completely out of meds and needs them filled today. Please advise at [phone]

## 2020-10-01 NOTE — Telephone Encounter (Signed)
[name] calling back per [facility] to follow up on these med refills patient  is completely out of meds   Could we at least fill all meds even if we cant fill Trazydone  Please advise   And reach to patient [phone] Or [name] at [phone]

## 2020-10-01 NOTE — Telephone Encounter (Signed)
Notes to clinic: medication last filled by Dr. Pamella Pert  Review for refill under [name]    Requested Prescriptions  Pending Prescriptions Disp Refills   traZODone (DESYREL) 100 MG tablet 90 tablet 0    Sig: Take 1 tablet (100 mg total) by mouth at bedtime.      Psychiatry: Antidepressants - Serotonin Modulator Passed - 10/01/2020 12:22 PM      Passed - Completed PHQ-2 or PHQ-9 in the last 360 days      Passed - Valid encounter within last 6 months    Recent Outpatient Visits           6 days ago Need for immunization against influenza   [facility], Delfino Lovett, NP   3 weeks ago Olecranon bursitis of both elbows   [facility], Delfino Lovett, NP   1 month ago Fatigue, unspecified type   [facility], Delfino Lovett, NP   2 months ago Cellulitis of lower extremity, unspecified laterality   [facility], Delfino Lovett, NP   2 months ago Chronic combined systolic and diastolic heart failure ([place])   [facility], Delfino Lovett, NP                  torsemide (DEMADEX) 20 MG tablet 90 tablet 2    Sig: Take 1 tablet (20 mg total) by mouth daily as needed. For swelling in legs      Cardiovascular:  Diuretics - Loop Passed - 10/01/2020 12:22 PM      Passed - K in normal range and within 360 days    Potassium  Date Value Ref Range Status  08/23/2020 3.7 3.5 - 5.2 mmol/L Final          Passed - Ca in normal range and within 360 days    Calcium  Date Value Ref Range Status  08/23/2020 9.9 8.6 - 10.2 mg/dL Final          Passed - Na in normal range and within 360 days    Sodium  Date Value Ref Range Status  08/23/2020 135 134 - 144 mmol/L Final          Passed - Cr in normal range and within 360 days    Creatinine  Date Value Ref Range Status  08/26/2017 0.97 0.70 - 1.30 mg/dL Final   Creat  Date Value Ref Range Status  12/04/2014 0.97 0.50 - 1.35 mg/dL Final   Creatinine, Ser  Date Value Ref  Range Status  08/23/2020 1.10 0.76 - 1.27 mg/dL Final   Creatinine, Urine  Date Value Ref Range Status  07/09/2015 209.18 >20.0 mg/dL Final          Passed - Last BP in normal range    BP Readings from Last 1 Encounters:  09/25/20 91/62          Passed - Valid encounter within last 6 months    Recent Outpatient Visits           6 days ago Need for immunization against influenza   [facility], Delfino Lovett, NP   3 weeks ago Olecranon bursitis of both elbows   [facility], Delfino Lovett, NP   1 month ago Fatigue, unspecified type   [facility], Delfino Lovett, NP   2 months ago Cellulitis of lower extremity, unspecified laterality   [facility] at [place], NP   2 months ago Chronic  combined systolic and diastolic heart failure ([place])   [facility] at [place], NP                  tamsulosin (FLOMAX) 0.4 MG CAPS capsule 90 capsule 0    Sig: Take 1 capsule (0.4 mg total) by mouth daily after breakfast.      Urology: Alpha-Adrenergic Blocker Passed - 10/01/2020 12:22 PM      Passed - Last BP in normal range    BP Readings from Last 1 Encounters:  09/25/20 91/62          Passed - Valid encounter within last 12 months    Recent Outpatient Visits           6 days ago Need for immunization against influenza   [facility], Delfino Lovett, NP   3 weeks ago Olecranon bursitis of both elbows   [facility], [place], NP   1 month ago Fatigue, unspecified type   [facility], Delfino Lovett, NP   2 months ago Cellulitis of lower extremity, unspecified laterality   [facility], Delfino Lovett, NP   2 months ago Chronic combined systolic and diastolic heart failure [facility])   [facility], Richard, NP                  cyanocobalamin (,VITAMIN B-12,) 1000 MCG/ML injection 1 mL 11    Sig: Inject 1 mL (1,000 mcg total) into the muscle  every 30 (thirty) days.      Off-Protocol Failed - 10/01/2020 12:22 PM      Failed - Medication not assigned to a protocol, review manually.      Passed - Valid encounter within last 12 months    Recent Outpatient Visits           6 days ago Need for immunization against influenza   [facility], Delfino Lovett, NP   3 weeks ago Olecranon bursitis of both elbows   [facility], Delfino Lovett, NP   1 month ago Fatigue, unspecified type   [facility], Delfino Lovett, NP   2 months ago Cellulitis of lower extremity, unspecified laterality   [facility], Delfino Lovett, NP   2 months ago Chronic combined systolic and diastolic heart failure [facility])   Primary [facility] at [place], NP               Off-Protocol Failed - 10/01/2020 12:22 PM      Failed - Medication not assigned to a protocol, review manually.      Passed - Valid encounter within last 12 months    Recent Outpatient Visits           6 days ago Need for immunization against influenza   [facility], Delfino Lovett, NP   3 weeks ago Olecranon bursitis of both elbows   [facility], Delfino Lovett, NP   1 month ago Fatigue, unspecified type   [facility], Delfino Lovett, NP   2 months ago Cellulitis of lower extremity, unspecified laterality   [facility], Delfino Lovett, NP   2 months ago Chronic combined systolic and diastolic heart failure [facility])   [facility], Richard, NP                  ondansetron (ZOFRAN-ODT) 8 MG disintegrating tablet 90 tablet  1    Sig: Take 1 tablet (8 mg total) by mouth every 8 (eight) hours as needed for nausea or vomiting.      Not Delegated - Gastroenterology: Antiemetics Failed - 10/01/2020 12:22 PM      Failed - This refill cannot be delegated      Passed - Valid encounter within last 6 months    Recent Outpatient Visits           6 days ago Need for  immunization against influenza   [facility], Delfino Lovett, NP   3 weeks ago Olecranon bursitis of both elbows   [facility], Delfino Lovett, NP   1 month ago Fatigue, unspecified type   [facility], Delfino Lovett, NP   2 months ago Cellulitis of lower extremity, unspecified laterality   [facility], [place], NP   2 months ago Chronic combined systolic and diastolic heart failure [facility])   [facility], Richard, NP                  methocarbamol (ROBAXIN) 750 MG tablet 180 tablet 5    Sig: TAKE 1 OR 2 TABLETS THREE TIMES A DAY AS NEEDED.      Not Delegated - Analgesics:  Muscle Relaxants Failed - 10/01/2020 12:22 PM      Failed - This refill cannot be delegated      Passed - Valid encounter within last 6 months    Recent Outpatient Visits           6 days ago Need for immunization against influenza   [facility], Delfino Lovett, NP   3 weeks ago Olecranon bursitis of both elbows   [facility], Delfino Lovett, NP   1 month ago Fatigue, unspecified type   [facility], Delfino Lovett, NP   2 months ago Cellulitis of lower extremity, unspecified laterality   [facility], Delfino Lovett, NP   2 months ago Chronic combined systolic and diastolic heart failure [facility])   [facility] at [facility], NP

## 2020-10-03 ENCOUNTER — Other Ambulatory Visit: Payer: Self-pay | Admitting: Registered Nurse

## 2020-10-03 ENCOUNTER — Ambulatory Visit (INDEPENDENT_AMBULATORY_CARE_PROVIDER_SITE_OTHER): Payer: BC Managed Care – PPO

## 2020-10-03 DIAGNOSIS — I255 Ischemic cardiomyopathy: Secondary | ICD-10-CM | POA: Diagnosis not present

## 2020-10-03 DIAGNOSIS — L03119 Cellulitis of unspecified part of limb: Secondary | ICD-10-CM

## 2020-10-03 MED ORDER — CYANOCOBALAMIN 1000 MCG/ML IJ SOLN: 1000.0000 ug | INTRAMUSCULAR | 11 refills | Status: DC

## 2020-10-03 MED ORDER — TAMSULOSIN HCL 0.4 MG PO CAPS: 0.4000 mg | ORAL_CAPSULE | Freq: Every day | ORAL | 0 refills | Status: DC

## 2020-10-03 NOTE — Telephone Encounter (Signed)
Please advise on this refill

## 2020-10-03 NOTE — Telephone Encounter (Signed)
Requested medication (s) are due for refill today: yes  Requested medication (s) are on the active medication list:yes  Last refill:  08/21/20  Future visit scheduled: no  Notes to clinic: no assigned protocol    Requested Prescriptions  Pending Prescriptions Disp Refills   sulfamethoxazole-trimethoprim (BACTRIM DS) 800-160 MG tablet [Pharmacy Med Name: sulfamethoxazole 800 mg-trimethoprim 160 mg tablet] 14 tablet 0    Sig: Take 1 tablet by mouth 2 (two) times daily.      Off-Protocol Failed - 10/03/2020 12:22 PM      Failed - Medication not assigned to a protocol, review manually.      Passed - Valid encounter within last 12 months    Recent Outpatient Visits           1 week ago Need for immunization against influenza   [facility], Delfino Lovett, NP   3 weeks ago Olecranon bursitis of both elbows   [facility], Delfino Lovett, NP   1 month ago Fatigue, unspecified type   [facility], Delfino Lovett, NP   2 months ago Cellulitis of lower extremity, unspecified laterality   [facility], Delfino Lovett, NP   2 months ago Chronic combined systolic and diastolic heart failure [facility])   [facility] at [place], NP

## 2020-10-04 LAB — CUP PACEART REMOTE DEVICE CHECK
Battery Remaining Longevity: 6 mo
Battery Remaining Percentage: 6 %
Battery Voltage: 2.65 V
Brady Statistic AP VP Percent: 1 %
Brady Statistic AP VS Percent: 1 %
Brady Statistic AS VP Percent: 1 %
Brady Statistic AS VS Percent: 99 %
Brady Statistic RA Percent Paced: 1 %
Brady Statistic RV Percent Paced: 1 %
Date Time Interrogation Session: 20220324160132
HighPow Impedance: 72 Ohm
HighPow Impedance: 72 Ohm
Implantable Lead Implant Date: 20120201
Implantable Lead Implant Date: 20120201
Implantable Lead Location: 753859
Implantable Lead Location: 753860
Implantable Pulse Generator Implant Date: 20120201
Lead Channel Impedance Value: 340 Ohm
Lead Channel Impedance Value: 450 Ohm
Lead Channel Pacing Threshold Amplitude: 0.75 V
Lead Channel Pacing Threshold Amplitude: 1.75 V
Lead Channel Pacing Threshold Pulse Width: 0.5 ms
Lead Channel Pacing Threshold Pulse Width: 0.5 ms
Lead Channel Sensing Intrinsic Amplitude: 12 mV
Lead Channel Sensing Intrinsic Amplitude: 3.8 mV
Lead Channel Setting Pacing Amplitude: 2 V
Lead Channel Setting Pacing Amplitude: 3 V
Lead Channel Setting Pacing Pulse Width: 0.5 ms
Lead Channel Setting Sensing Sensitivity: 0.5 mV
Pulse Gen Serial Number: 815096

## 2020-10-14 ENCOUNTER — Telehealth: Payer: Self-pay | Admitting: Registered Nurse

## 2020-10-14 NOTE — Telephone Encounter (Signed)
Called patient to speak with him about the Referral and also set him an appointment for 10/21/2020 at 12:30 pm.

## 2020-10-14 NOTE — Telephone Encounter (Signed)
Patient is calling to check on home health referral that was supposed to be put in in March, 2022.  Please advise

## 2020-10-15 ENCOUNTER — Telehealth: Payer: Self-pay

## 2020-10-15 NOTE — Telephone Encounter (Signed)
Pt called stating never received LASIX that was to be sent in for fluid on legs, please advise

## 2020-10-15 NOTE — Progress Notes (Signed)
Remote ICD transmission.   

## 2020-10-16 NOTE — Telephone Encounter (Signed)
They have called back to check status please advise, pt has swelling in his legs

## 2020-10-21 ENCOUNTER — Ambulatory Visit: Payer: BC Managed Care – PPO | Admitting: Registered Nurse

## 2020-10-21 DIAGNOSIS — Z0289 Encounter for other administrative examinations: Secondary | ICD-10-CM

## 2020-10-23 NOTE — Telephone Encounter (Signed)
Please advise. Im not able to refill Lasix.

## 2020-10-23 NOTE — Telephone Encounter (Signed)
Pt called again following up on lasix. Please advise.

## 2020-10-24 ENCOUNTER — Other Ambulatory Visit: Payer: Self-pay | Admitting: Registered Nurse

## 2020-10-24 DIAGNOSIS — R609 Edema, unspecified: Secondary | ICD-10-CM

## 2020-10-24 DIAGNOSIS — L03119 Cellulitis of unspecified part of limb: Secondary | ICD-10-CM

## 2020-10-24 MED ORDER — FUROSEMIDE 20 MG PO TABS: 20.0000 mg | ORAL_TABLET | Freq: Every day | ORAL | 3 refills | Status: DC

## 2020-10-24 NOTE — Telephone Encounter (Signed)
Patient has been called and notified that his medication was sent to the pharmacy.

## 2020-10-24 NOTE — Telephone Encounter (Signed)
Lasix request

## 2020-10-24 NOTE — Telephone Encounter (Signed)
[name] home health caregiver has called back in regard to this medication.  [facility] has not received this medication.    States patients feet are swelling and having trouble urination.    I am going to send to [facility] for triage.    Please follow up in regard.

## 2020-10-28 ENCOUNTER — Telehealth (INDEPENDENT_AMBULATORY_CARE_PROVIDER_SITE_OTHER): Payer: BC Managed Care – PPO | Admitting: Registered Nurse

## 2020-10-28 ENCOUNTER — Encounter: Payer: Self-pay | Admitting: Registered Nurse

## 2020-10-28 DIAGNOSIS — I272 Pulmonary hypertension, unspecified: Secondary | ICD-10-CM | POA: Diagnosis not present

## 2020-10-28 DIAGNOSIS — R5383 Other fatigue: Secondary | ICD-10-CM

## 2020-10-28 DIAGNOSIS — R634 Abnormal weight loss: Secondary | ICD-10-CM

## 2020-10-28 DIAGNOSIS — R609 Edema, unspecified: Secondary | ICD-10-CM | POA: Diagnosis not present

## 2020-10-28 DIAGNOSIS — R296 Repeated falls: Secondary | ICD-10-CM

## 2020-10-28 DIAGNOSIS — I5042 Chronic combined systolic (congestive) and diastolic (congestive) heart failure: Secondary | ICD-10-CM | POA: Diagnosis not present

## 2020-10-28 NOTE — Progress Notes (Signed)
Telemedicine Encounter- SOAP NOTE Established Patient  This telephone encounter was conducted with the patient's (or proxy's) verbal consent via audio telecommunications: yes  Patient was instructed to have this encounter in a suitably private space; and to only have persons present to whom they give permission to participate. In addition, patient identity was confirmed by use of name plus two identifiers (DOB and address).  I discussed the limitations, risks, security and privacy concerns of performing an evaluation and management service by telephone and the availability of in person appointments. I also discussed with the patient that there may be a patient responsible charge related to this service. The patient expressed understanding and agreed to proceed.  I spent a total of 22 minutes talking with the patient or their proxy.  Patient at home Provider in office  Chief Complaint  Patient presents with  . Edema    Pt having stiffness and swelling in bilateral legs several weeks, pt also notes back problems causing him to have issues getting fluid off and mobility   . Follow-up    Pt wants to discuss home health order     Subjective   [name] is a 60 y.o. established patient. Telephone visit today for ongoing swelling  HPI We had tried switching from torsemide to furosemide with hopes it would help more with edema but with low expectations. Unfortunately no discernable effect.  No new CV symptoms beyond ongoing swelling. Some ulcerations come and go. We have used bactrim in the past with good effect Has been doing nonpharm with compression and elevation. Not effective  Otherwise no changes. Unfortunately CT of abd/pelvis declined by ins for weight loss. Will pursue cardiology appt first. No GI symptoms  Patient Active Problem List   Diagnosis Date Noted  . Rib pain on right side 01/17/2019  . Acute respiratory failure ([place]) 09/29/2018  . Drug overdose, accidental or  unintentional, initial encounter 09/29/2018  . Polycythemia, secondary   . Chronic nausea 08/05/2018  . Cough 03/15/2018  . Right lower lobe pneumonia 03/15/2018  . Tobacco dependence 03/15/2018  . Marijuana use, episodic 03/15/2018  . Hypoxia 03/11/2018  . Acute on chronic respiratory failure with hypoxia ([place]) 03/11/2018  . Acute on chronic congestive heart failure ([place])   . Hypogonadism in male 01/16/2018  . Vitamin B12 deficiency 01/16/2018  . Central sleep apnea 07/27/2017  . Treatment-emergent central sleep apnea 07/27/2017  . Chronic respiratory failure with hypoxia ([place]) 07/27/2017  . COPD  GOLD 0 01/02/2017  . Hemoptysis 01/01/2017  . Memory difficulty 07/22/2016  . Constipation 09/04/2014  . Unstable angina ([place]) 06/14/2014  . Therapeutic opioid induced constipation 04/02/2014  . Hypoxemia 09/25/2013  . Erythrocytosis 09/18/2013  . Erectile dysfunction 11/24/2012  . 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 MAI (mycobacterium avium-intracellulare) ([place]) 05/16/2012  . 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 Ventricular tachycardia ([place]) 06/19/2011  . Testicular cancer ([place]) 05/22/2011  . PAD (peripheral artery disease) ([place]) 02/24/2011  . Dual implantable cardioverter-defibrillator in situ 12/24/2010  . Depression 12/09/2010  . cHistory of testicular cancer 12/09/2010  . Benzodiazepine dependence ([place]) 12/09/2010  . Narcotic dependence ([place]) 12/09/2010  . Chronic back pain 12/05/2010  . Anxiety disorder 10/17/2010  . CAD, NATIVE VESSEL 09/15/2010  . COMBINED HEART FAILURE, CHRONIC 09/15/2010  . Ischemic cardiomyopathy 09/12/2010    Past Medical History:  Diagnosis Date  . Anxiety   . Automatic implantable cardiac defibrillator  [place]  . Benign prostatic hypertrophy   . Benzodiazepine dependence (HCC)    chronic  . CAD (coronary artery disease)    Last cath 2/12. 3-v  CAD. Failed PCI of distal RCAc  CATH [facility] 4/13 with DES to  LAD X2  . CHF (congestive heart failure) ([place])   . Chronic back pain    lumbar stenosis  . Chronic systolic heart failure (HCC)    EF 20-25%. s/p ST. Jude ICD  . Depression   . DJD (degenerative joint disease)   . History of testicular cancer   . Ischemic cardiomyopathy   . Myocardial infarction ([place])   . Narcotic dependence (HCC)    chronic  . OSA on CPAP   . Polycythemia, secondary    S/p hematology consultation for polycythema on 01/06/18.  S/p bone marrow biopsy on 12/31/17 that was negative for myeloproliferative disorder.  Feels secondary to sleep apnea, active smoking, testosterone supplementation, nocturnal hypoxia  . TIA (transient ischemic attack)   . Vitamin B12 deficiency 07/22/2016    Current Outpatient Medications  Medication Sig Dispense Refill  . albuterol (VENTOLIN HFA) 108 (90 Base) MCG/ACT inhaler Inhale 2 puffs into the lungs every 6 (six) hours as needed for wheezing or shortness of breath. 18 g 5  . ALPRAZolam (XANAX) 1 MG tablet Take 1 mg by mouth 4 (four) times daily.     . carvedilol (COREG) 25 MG tablet Take 25 mg by mouth 2 (two) times daily with a meal.    . clopidogrel (PLAVIX) 75 MG tablet TAKE 1 TABLET EACH DAY. 90 tablet 0  . cyanocobalamin (,VITAMIN B-12,) 1000 MCG/ML injection Inject 1 mL (1,000 mcg total) into the muscle every 30 (thirty) days. 1 mL 11  . dapagliflozin propanediol (FARXIGA) 10 MG TABS tablet Take 1 tablet (10 mg total) by mouth daily before breakfast. 30 tablet 6  . diazepam (VALIUM) 10 MG tablet Take 20 mg by mouth every 12 (twelve) hours as needed for anxiety.   2  . diclofenac (VOLTAREN) 75 MG EC tablet Take 1 tablet (75 mg total) by mouth 2 (two) times daily. 20 tablet 0  . doxycycline (VIBRAMYCIN) 100 MG capsule Take 1 capsule (100 mg total) by mouth 2 (two) times daily. 14 capsule 0  . erythromycin ophthalmic ointment Place 1 application into the right eye at bedtime. 3.5  g 1  . ezetimibe (ZETIA) 10 MG tablet TAKE 1 TABLET EACH DAY. 90 tablet 0  . furosemide (LASIX) 20 MG tablet Take 1 tablet (20 mg total) by mouth daily. 30 tablet 3  . ibuprofen (ADVIL,MOTRIN) 200 MG tablet Take 800 mg by mouth every 6 (six) hours as needed for moderate pain.    Marland Kitchen lactulose (CHRONULAC) 10 GM/15ML solution Take 15 mLs (10 g total) by mouth 2 (two) times daily as needed for mild constipation. 1892 mL 0  . lisinopril (PRINIVIL,ZESTRIL) 5 MG tablet Take 5 mg by mouth daily.    Marland Kitchen loperamide (IMODIUM A-D) 2 MG tablet Take 1.5 tablets (3 mg total) by mouth 4 (four) times daily as needed for diarrhea or loose stools. 30 tablet 2  . losartan (COZAAR) 25 MG tablet Take 1 tablet (25 mg total) by mouth daily. 90 tablet 3  . methadone (METHADOSE) 40 MG disintegrating tablet Take 200 mg by mouth daily.    . methocarbamol (ROBAXIN) 750 MG tablet TAKE 1 OR 2 TABLETS THREE TIMES A DAY AS NEEDED. 180 tablet 5  . naproxen (  NAPROSYN) 500 MG tablet Take 1 tablet (500 mg total) by mouth 2 (two) times daily as needed for moderate pain. 60 tablet 2  . nitroGLYCERIN (NITROLINGUAL) 0.4 MG/SPRAY spray Place 2 sprays under the tongue every 5 (five) minutes x 3 doses as needed for chest pain.     Marland Kitchen ondansetron (ZOFRAN-ODT) 8 MG disintegrating tablet Take 1 tablet (8 mg total) by mouth every 8 (eight) hours as needed for nausea or vomiting. 90 tablet 1  . oxymetazoline (AFRIN) 0.05 % nasal spray Place 1 spray into both nostrils 2 (two) times daily.    . pantoprazole (PROTONIX) 40 MG tablet Take 1 tablet (40 mg total) by mouth daily. 90 tablet 1  . polyethylene glycol powder (GLYCOLAX/MIRALAX) 17 GM/SCOOP powder Take 17 g by mouth 2 (two) times daily as needed for mild constipation.    . rosuvastatin (CRESTOR) 40 MG tablet Take 1 tablet (40 mg total) by mouth every evening. 30 tablet 2  . sildenafil (REVATIO) 20 MG tablet TAKE 5 TABLETS AS NEEDED. 90 tablet 0  . sildenafil (VIAGRA) 100 MG tablet Take 0.5 tablets  (50 mg total) by mouth daily as needed for erectile dysfunction. 30 tablet 0  . spironolactone (ALDACTONE) 25 MG tablet TAKE (1/2) TABLET DAILY. 45 tablet 1  . sucralfate (CARAFATE) 1 g tablet Take 1 tablet (1 g total) by mouth 4 (four) times daily -  with meals and at bedtime. 120 tablet 0  . sulfamethoxazole-trimethoprim (BACTRIM DS) 800-160 MG tablet Take 1 tablet by mouth 2 (two) times daily. 14 tablet 0  . tamsulosin (FLOMAX) 0.4 MG CAPS capsule Take 1 capsule (0.4 mg total) by mouth daily after breakfast. 90 capsule 0  . tamsulosin (FLOMAX) 0.4 MG CAPS capsule Take 1 capsule (0.4 mg total) by mouth daily after breakfast. 90 capsule 0  . TESTOSTERONE CYPIONATE IM Inject into the muscle every 14 (fourteen) days.    Marland Kitchen torsemide 40 MG TABS Take 20 mg by mouth daily as needed. For swelling in legs 90 tablet 3  . traZODone (DESYREL) 100 MG tablet Take 1 tablet (100 mg total) by mouth at bedtime. 90 tablet 0  . triamcinolone (NASACORT AQ) 55 MCG/ACT AERO nasal inhaler Place 1 spray into the nose daily. 1 each 12  . Vitamin D, Ergocalciferol, (DRISDOL) 1.25 MG (50000 UNIT) CAPS capsule Take 1 capsule (50,000 Units total) by mouth every 7 (seven) days. 12 capsule 1  . zolpidem (AMBIEN) 10 MG tablet Take 10 mg by mouth at bedtime.      No current facility-administered medications for this visit.    Allergies  Allergen Reactions  . Darvocet [Propoxyphene N-Acetaminophen] Anaphylaxis    Throat closes    Social History   Socioeconomic History  . Marital status: Divorced    Spouse name: Not on file  . Number of children: 4  . Years of education: Not on file  . Highest education level: Not on file  Occupational History  . Not on file  Tobacco Use  . Smoking status: Current Every Day Smoker    Packs/day: 0.50    Years: 29.00    Pack years: 14.50    Types: Cigarettes, E-cigarettes    Last attempt to quit: 07/14/2015    Years since quitting: 5.2  . Smokeless tobacco: Never Used  . Tobacco  comment: no cigarette in 4 days but yes to e-cig  Vaping Use  . Vaping Use: Every day  . Start date: 07/14/2015  Substance and Sexual Activity  .  Alcohol use: No    Alcohol/week: 0.0 standard drinks  . Drug use: Yes    Types: Marijuana    Comment: Urine showed THC  . Sexual activity: Yes    Partners: Male  Other Topics Concern  . Not on file  Social History Narrative   Marital status: married x 12 years; wife is severe alcoholic.      Children: 3 married daughters (23, 41, 75); 1 son (38yo); 3 grandchildren born in 2017      Lives: with wife, son      Left-handed   Caffeine: 3 drinks per day   Social Determinants of Health   Financial Resource Strain: Not on file  Food Insecurity: Not on file  Transportation Needs: Not on file  Physical Activity: Not on file  Stress: Not on file  Social Connections: Not on file  Intimate Partner Violence: Not on file    Review of Systems  Constitutional: Negative.   HENT: Negative.   Eyes: Negative.   Respiratory: Positive for cough and shortness of breath.   Cardiovascular: Positive for chest pain, claudication and leg swelling.  Gastrointestinal: Negative.   Genitourinary: Negative.   Musculoskeletal: Positive for joint pain (bilateral elbows).  Skin: Negative.   Neurological: Negative.   Endo/Heme/Allergies: Negative.   Psychiatric/Behavioral: Negative.   All other systems reviewed and are negative.   Objective   Vitals as reported by the patient: There were no vitals filed for this visit.  Olden was seen today for edema and follow-up.  Diagnoses and all orders for this visit:  Dependent edema -     Ambulatory referral to [place] -     Ambulatory referral to Vascular Surgery -     Ambulatory referral to Cardiology  Pulmonary hypertension [facility]) -     Ambulatory referral to [place] -     Ambulatory referral to Cardiology  Chronic combined systolic and diastolic heart failure ([place]) -     Ambulatory referral to  [place] -     Ambulatory referral to Cardiology  Fatigue, unspecified type -     Ambulatory referral to [place] -     Ambulatory referral to Cardiology  Unexplained weight loss -     Ambulatory referral to [place] -     Ambulatory referral to Cardiology  Frequent falls -     Ambulatory referral to [place] referral to home health  In part video visit today  Given ongoing swelling will refer back to Dr. Haroldine Laws and refer to vascular. Concern for decompensating CHF  ER precautions given. Pt voices understanding  Return in 2 mo for med check  Patient encouraged to call clinic with any questions, comments, or concerns.  I discussed the assessment and treatment plan with the patient. The patient was provided an opportunity to ask questions and all were answered. The patient agreed with the plan and demonstrated an understanding of the instructions.   The patient was advised to call back or seek an in-person evaluation if the symptoms worsen or if the condition fails to improve as anticipated.  I provided 22 minutes of non-face-to-face time during this encounter.  [name], NP  Primary Care at [facility]

## 2020-10-31 ENCOUNTER — Telehealth (HOSPITAL_COMMUNITY): Payer: Self-pay | Admitting: Internal Medicine

## 2020-10-31 NOTE — Telephone Encounter (Signed)
Pt request a call back asap, he wants to schedule an echo, visit with DB, please advise

## 2020-11-01 NOTE — Telephone Encounter (Signed)
Attempted to call pt, no answer and VM was full so could not leave message

## 2020-11-06 ENCOUNTER — Ambulatory Visit (INDEPENDENT_AMBULATORY_CARE_PROVIDER_SITE_OTHER): Payer: BC Managed Care – PPO

## 2020-11-06 DIAGNOSIS — I255 Ischemic cardiomyopathy: Secondary | ICD-10-CM

## 2020-11-06 LAB — CUP PACEART REMOTE DEVICE CHECK
Battery Remaining Longevity: 4 mo
Battery Remaining Percentage: 4 %
Battery Voltage: 2.62 V
Brady Statistic AP VP Percent: 1 %
Brady Statistic AP VS Percent: 1 %
Brady Statistic AS VP Percent: 1 %
Brady Statistic AS VS Percent: 99 %
Brady Statistic RA Percent Paced: 1 %
Brady Statistic RV Percent Paced: 1 %
Date Time Interrogation Session: 20220427041305
HighPow Impedance: 68 Ohm
HighPow Impedance: 68 Ohm
Implantable Lead Implant Date: 20120201
Implantable Lead Implant Date: 20120201
Implantable Lead Location: 753859
Implantable Lead Location: 753860
Implantable Pulse Generator Implant Date: 20120201
Lead Channel Impedance Value: 340 Ohm
Lead Channel Impedance Value: 410 Ohm
Lead Channel Pacing Threshold Amplitude: 0.75 V
Lead Channel Pacing Threshold Amplitude: 1.75 V
Lead Channel Pacing Threshold Pulse Width: 0.5 ms
Lead Channel Pacing Threshold Pulse Width: 0.5 ms
Lead Channel Sensing Intrinsic Amplitude: 12 mV
Lead Channel Sensing Intrinsic Amplitude: 3.5 mV
Lead Channel Setting Pacing Amplitude: 2 V
Lead Channel Setting Pacing Amplitude: 3 V
Lead Channel Setting Pacing Pulse Width: 0.5 ms
Lead Channel Setting Sensing Sensitivity: 0.5 mV
Pulse Gen Serial Number: 815096

## 2020-11-12 ENCOUNTER — Other Ambulatory Visit: Payer: Self-pay

## 2020-11-12 DIAGNOSIS — I83009 Varicose veins of unspecified lower extremity with ulcer of unspecified site: Secondary | ICD-10-CM

## 2020-11-12 DIAGNOSIS — L97909 Non-pressure chronic ulcer of unspecified part of unspecified lower leg with unspecified severity: Secondary | ICD-10-CM

## 2020-11-19 DIAGNOSIS — G4733 Obstructive sleep apnea (adult) (pediatric): Secondary | ICD-10-CM | POA: Diagnosis not present

## 2020-11-22 ENCOUNTER — Ambulatory Visit (HOSPITAL_COMMUNITY): Payer: BC Managed Care – PPO | Attending: Vascular Surgery

## 2020-11-27 NOTE — Addendum Note (Signed)
Addended by: Cheri Kearns A on: 11/27/2020 12:45 PM   Modules accepted: Level of Service

## 2020-11-27 NOTE — Progress Notes (Signed)
Remote ICD transmission.   

## 2020-12-12 ENCOUNTER — Ambulatory Visit (HOSPITAL_COMMUNITY): Payer: BC Managed Care – PPO | Attending: Vascular Surgery

## 2020-12-20 ENCOUNTER — Encounter (HOSPITAL_COMMUNITY): Payer: BC Managed Care – PPO

## 2020-12-21 ENCOUNTER — Other Ambulatory Visit: Payer: Self-pay | Admitting: Registered Nurse

## 2020-12-21 DIAGNOSIS — I272 Pulmonary hypertension, unspecified: Secondary | ICD-10-CM

## 2021-01-02 ENCOUNTER — Ambulatory Visit (INDEPENDENT_AMBULATORY_CARE_PROVIDER_SITE_OTHER): Payer: BC Managed Care – PPO

## 2021-01-02 DIAGNOSIS — I255 Ischemic cardiomyopathy: Secondary | ICD-10-CM

## 2021-01-02 LAB — CUP PACEART REMOTE DEVICE CHECK
Battery Remaining Longevity: 3 mo
Battery Remaining Percentage: 3 %
Battery Voltage: 2.6 V
Brady Statistic AP VP Percent: 1 %
Brady Statistic AP VS Percent: 1 %
Brady Statistic AS VP Percent: 1 %
Brady Statistic AS VS Percent: 99 %
Brady Statistic RA Percent Paced: 1 %
Brady Statistic RV Percent Paced: 1 %
Date Time Interrogation Session: 20220623022935
HighPow Impedance: 62 Ohm
HighPow Impedance: 62 Ohm
Implantable Lead Implant Date: 20120201
Implantable Lead Implant Date: 20120201
Implantable Lead Location: 753859
Implantable Lead Location: 753860
Implantable Pulse Generator Implant Date: 20120201
Lead Channel Impedance Value: 300 Ohm
Lead Channel Impedance Value: 390 Ohm
Lead Channel Pacing Threshold Amplitude: 0.75 V
Lead Channel Pacing Threshold Amplitude: 1.75 V
Lead Channel Pacing Threshold Pulse Width: 0.5 ms
Lead Channel Pacing Threshold Pulse Width: 0.5 ms
Lead Channel Sensing Intrinsic Amplitude: 12 mV
Lead Channel Sensing Intrinsic Amplitude: 2.7 mV
Lead Channel Setting Pacing Amplitude: 2 V
Lead Channel Setting Pacing Amplitude: 3 V
Lead Channel Setting Pacing Pulse Width: 0.5 ms
Lead Channel Setting Sensing Sensitivity: 0.5 mV
Pulse Gen Serial Number: 815096

## 2021-01-21 NOTE — Progress Notes (Signed)
Remote ICD transmission.   

## 2021-02-03 ENCOUNTER — Ambulatory Visit (INDEPENDENT_AMBULATORY_CARE_PROVIDER_SITE_OTHER): Payer: BC Managed Care – PPO

## 2021-02-03 DIAGNOSIS — I255 Ischemic cardiomyopathy: Secondary | ICD-10-CM

## 2021-02-05 LAB — CUP PACEART REMOTE DEVICE CHECK
Battery Remaining Longevity: 1 mo
Battery Remaining Percentage: 2 %
Battery Voltage: 2.6 V
Brady Statistic AP VP Percent: 1 %
Brady Statistic AP VS Percent: 1 %
Brady Statistic AS VP Percent: 1 %
Brady Statistic AS VS Percent: 99 %
Brady Statistic RA Percent Paced: 1 %
Brady Statistic RV Percent Paced: 1 %
Date Time Interrogation Session: 20220725064038
HighPow Impedance: 68 Ohm
HighPow Impedance: 68 Ohm
Implantable Lead Implant Date: 20120201
Implantable Lead Implant Date: 20120201
Implantable Lead Location: 753859
Implantable Lead Location: 753860
Implantable Pulse Generator Implant Date: 20120201
Lead Channel Impedance Value: 340 Ohm
Lead Channel Impedance Value: 380 Ohm
Lead Channel Pacing Threshold Amplitude: 0.75 V
Lead Channel Pacing Threshold Amplitude: 1.75 V
Lead Channel Pacing Threshold Pulse Width: 0.5 ms
Lead Channel Pacing Threshold Pulse Width: 0.5 ms
Lead Channel Sensing Intrinsic Amplitude: 12 mV
Lead Channel Sensing Intrinsic Amplitude: 3.6 mV
Lead Channel Setting Pacing Amplitude: 2 V
Lead Channel Setting Pacing Amplitude: 3 V
Lead Channel Setting Pacing Pulse Width: 0.5 ms
Lead Channel Setting Sensing Sensitivity: 0.5 mV
Pulse Gen Serial Number: 815096

## 2021-02-10 ENCOUNTER — Ambulatory Visit: Payer: Self-pay | Admitting: Registered Nurse

## 2021-02-11 ENCOUNTER — Ambulatory Visit: Payer: Self-pay | Admitting: Registered Nurse

## 2021-02-12 ENCOUNTER — Ambulatory Visit: Payer: Self-pay | Admitting: Registered Nurse

## 2021-02-13 ENCOUNTER — Inpatient Hospital Stay (HOSPITAL_COMMUNITY)
Admission: EM | Admit: 2021-02-13 | Discharge: 2021-02-14 | DRG: 308 | Discharge status: Against Medical Advice | Payer: Self-pay | Attending: Cardiovascular Disease | Admitting: Cardiovascular Disease

## 2021-02-13 ENCOUNTER — Telehealth: Payer: Self-pay | Admitting: Physician Assistant

## 2021-02-13 ENCOUNTER — Emergency Department (HOSPITAL_COMMUNITY): Payer: Self-pay

## 2021-02-13 ENCOUNTER — Other Ambulatory Visit: Payer: Self-pay

## 2021-02-13 DIAGNOSIS — Z79899 Other long term (current) drug therapy: Secondary | ICD-10-CM

## 2021-02-13 DIAGNOSIS — I255 Ischemic cardiomyopathy: Secondary | ICD-10-CM | POA: Diagnosis present

## 2021-02-13 DIAGNOSIS — G8929 Other chronic pain: Secondary | ICD-10-CM | POA: Diagnosis present

## 2021-02-13 DIAGNOSIS — I5042 Chronic combined systolic (congestive) and diastolic (congestive) heart failure: Secondary | ICD-10-CM | POA: Diagnosis present

## 2021-02-13 DIAGNOSIS — Y712 Prosthetic and other implants, materials and accessory cardiovascular devices associated with adverse incidents: Secondary | ICD-10-CM | POA: Diagnosis present

## 2021-02-13 DIAGNOSIS — I252 Old myocardial infarction: Secondary | ICD-10-CM

## 2021-02-13 DIAGNOSIS — N400 Enlarged prostate without lower urinary tract symptoms: Secondary | ICD-10-CM | POA: Diagnosis present

## 2021-02-13 DIAGNOSIS — N179 Acute kidney failure, unspecified: Secondary | ICD-10-CM | POA: Diagnosis present

## 2021-02-13 DIAGNOSIS — I2510 Atherosclerotic heart disease of native coronary artery without angina pectoris: Secondary | ICD-10-CM | POA: Diagnosis present

## 2021-02-13 DIAGNOSIS — F17210 Nicotine dependence, cigarettes, uncomplicated: Secondary | ICD-10-CM | POA: Diagnosis present

## 2021-02-13 DIAGNOSIS — Z8673 Personal history of transient ischemic attack (TIA), and cerebral infarction without residual deficits: Secondary | ICD-10-CM

## 2021-02-13 DIAGNOSIS — T82519A Breakdown (mechanical) of unspecified cardiac and vascular devices and implants, initial encounter: Secondary | ICD-10-CM | POA: Diagnosis present

## 2021-02-13 DIAGNOSIS — C9590 Leukemia, unspecified not having achieved remission: Secondary | ICD-10-CM | POA: Diagnosis present

## 2021-02-13 DIAGNOSIS — F419 Anxiety disorder, unspecified: Secondary | ICD-10-CM | POA: Diagnosis present

## 2021-02-13 DIAGNOSIS — F32A Depression, unspecified: Secondary | ICD-10-CM | POA: Diagnosis present

## 2021-02-13 DIAGNOSIS — T82111A Breakdown (mechanical) of cardiac pulse generator (battery), initial encounter: Principal | ICD-10-CM | POA: Diagnosis present

## 2021-02-13 DIAGNOSIS — G4733 Obstructive sleep apnea (adult) (pediatric): Secondary | ICD-10-CM | POA: Diagnosis present

## 2021-02-13 DIAGNOSIS — D45 Polycythemia vera: Secondary | ICD-10-CM | POA: Diagnosis present

## 2021-02-13 DIAGNOSIS — I959 Hypotension, unspecified: Secondary | ICD-10-CM | POA: Diagnosis present

## 2021-02-13 DIAGNOSIS — Z8547 Personal history of malignant neoplasm of testis: Secondary | ICD-10-CM

## 2021-02-13 DIAGNOSIS — G928 Other toxic encephalopathy: Secondary | ICD-10-CM | POA: Diagnosis present

## 2021-02-13 DIAGNOSIS — R001 Bradycardia, unspecified: Secondary | ICD-10-CM | POA: Diagnosis present

## 2021-02-13 DIAGNOSIS — Z20822 Contact with and (suspected) exposure to covid-19: Secondary | ICD-10-CM | POA: Diagnosis present

## 2021-02-13 DIAGNOSIS — Z9114 Patient's other noncompliance with medication regimen: Secondary | ICD-10-CM

## 2021-02-13 DIAGNOSIS — Z95810 Presence of automatic (implantable) cardiac defibrillator: Secondary | ICD-10-CM

## 2021-02-13 DIAGNOSIS — T402X1A Poisoning by other opioids, accidental (unintentional), initial encounter: Secondary | ICD-10-CM | POA: Diagnosis present

## 2021-02-13 DIAGNOSIS — D696 Thrombocytopenia, unspecified: Secondary | ICD-10-CM | POA: Diagnosis present

## 2021-02-13 DIAGNOSIS — Z888 Allergy status to other drugs, medicaments and biological substances status: Secondary | ICD-10-CM

## 2021-02-13 DIAGNOSIS — T403X1A Poisoning by methadone, accidental (unintentional), initial encounter: Secondary | ICD-10-CM | POA: Diagnosis present

## 2021-02-13 DIAGNOSIS — Z79890 Hormone replacement therapy (postmenopausal): Secondary | ICD-10-CM

## 2021-02-13 DIAGNOSIS — Z8249 Family history of ischemic heart disease and other diseases of the circulatory system: Secondary | ICD-10-CM

## 2021-02-13 DIAGNOSIS — N529 Male erectile dysfunction, unspecified: Secondary | ICD-10-CM | POA: Diagnosis present

## 2021-02-13 LAB — BASIC METABOLIC PANEL
Anion gap: 9 (ref 5–15)
BUN: 23 mg/dL (ref 8–23)
CO2: 26 mmol/L (ref 22–32)
Calcium: 9.1 mg/dL (ref 8.9–10.3)
Chloride: 101 mmol/L (ref 98–111)
Creatinine, Ser: 1.47 mg/dL — ABNORMAL HIGH (ref 0.61–1.24)
GFR, Estimated: 54 mL/min — ABNORMAL LOW (ref >60)
Glucose, Bld: 145 mg/dL — ABNORMAL HIGH (ref 70–99)
Potassium: 3.7 mmol/L (ref 3.5–5.1)
Sodium: 136 mmol/L (ref 135–145)

## 2021-02-13 LAB — CBC WITH DIFFERENTIAL/PLATELET
Abs Immature Granulocytes: 0.02 10*3/uL (ref 0.00–0.07)
Basophils Absolute: 0 10*3/uL (ref 0.0–0.1)
Basophils Relative: 1 %
Eosinophils Absolute: 0.2 10*3/uL (ref 0.0–0.5)
Eosinophils Relative: 3 %
HCT: 44.4 % (ref 39.0–52.0)
Hemoglobin: 14.7 g/dL (ref 13.0–17.0)
Immature Granulocytes: 0 %
Lymphocytes Relative: 32 %
Lymphs Abs: 1.7 10*3/uL (ref 0.7–4.0)
MCH: 33.3 pg (ref 26.0–34.0)
MCHC: 33.1 g/dL (ref 30.0–36.0)
MCV: 100.7 fL — ABNORMAL HIGH (ref 80.0–100.0)
Monocytes Absolute: 0.5 10*3/uL (ref 0.1–1.0)
Monocytes Relative: 10 %
Neutro Abs: 2.9 10*3/uL (ref 1.7–7.7)
Neutrophils Relative %: 54 %
Platelets: 144 10*3/uL — ABNORMAL LOW (ref 150–400)
RBC: 4.41 MIL/uL (ref 4.22–5.81)
RDW: 13 % (ref 11.5–15.5)
WBC: 5.4 10*3/uL (ref 4.0–10.5)
nRBC: 0 % (ref 0.0–0.2)

## 2021-02-13 LAB — MAGNESIUM: Magnesium: 2.1 mg/dL (ref 1.7–2.4)

## 2021-02-13 LAB — TROPONIN I (HIGH SENSITIVITY): Troponin I (High Sensitivity): 20 ng/L — ABNORMAL HIGH (ref <18)

## 2021-02-13 LAB — ETHANOL: Alcohol, Ethyl (B): <10 mg/dL (ref <10)

## 2021-02-13 LAB — LACTIC ACID, PLASMA: Lactic Acid, Venous: 1.4 mmol/L (ref 0.5–1.9)

## 2021-02-13 MED ORDER — NALOXONE HCL 0.4 MG/ML IJ SOLN
0.4000 mg | Freq: Once | INTRAMUSCULAR | Status: AC
  Administered 2021-02-13: 0.4 mg via INTRAVENOUS
  Filled 2021-02-13: qty 1

## 2021-02-13 MED ORDER — SODIUM CHLORIDE 0.9 % IV BOLUS
500.0000 mL | Freq: Once | INTRAVENOUS | Status: AC
  Administered 2021-02-13: 500 mL via INTRAVENOUS

## 2021-02-13 MED ORDER — NALOXONE HCL 0.4 MG/ML IJ SOLN
0.2000 mg | Freq: Once | INTRAMUSCULAR | Status: AC
  Administered 2021-02-13: 0.2 mg via INTRAVENOUS
  Filled 2021-02-13: qty 1

## 2021-02-13 MED ORDER — ONDANSETRON HCL 4 MG/2ML IJ SOLN: 4.0000 mg | Freq: Four times a day (QID) | INTRAMUSCULAR | Status: DC | PRN

## 2021-02-13 MED ORDER — SODIUM CHLORIDE 0.9 % IV BOLUS
1000.0000 mL | Freq: Once | INTRAVENOUS | Status: AC
Start: 2021-02-13 — End: 2021-02-14
  Administered 2021-02-13: 500 mL via INTRAVENOUS

## 2021-02-13 NOTE — ED Notes (Signed)
Spoke with Aaron Edelman in regards to pt's interrogation results. Reports his defibrillator has not been firing, it has been giving him vibrations to indicate for pt to replace his batteries. It vibrates when pt has not brought in device to be serviced.

## 2021-02-13 NOTE — ED Triage Notes (Signed)
Pt BIB EMS. Per EMS pt defibrillator has gone off 5 times today. Per EMS pt was hypotensive - BP 76/48 - pt reports this is normal.  EMS rate at 70s, RR 18, on RA at 98% Per EMS pt was ambulatory with walker on arrival

## 2021-02-13 NOTE — ED Provider Notes (Signed)
[facility] EMERGENCY DEPARTMENT Provider Note   CSN: [id number] Arrival date & time: 02/13/21  2051     History Chief Complaint  Patient presents with   Pacemaker Problem    [name] is a 60 y.o. male.  60 yo M with a chief complaints of having his defibrillator fire.  This happened at least 5 times today.  The patient denies any specific chest pain or trouble breathing.  He did feel like he was having jaw pain that was a bit atypical for him.  Typically has chronic pain all over from his leukemia.  Is on narcotics all the time.  He also took a delta 8 marijuana this evening and is having some side effects from it.  He called the cardiology office who told him he needed to come here to be evaluated.  The history is provided by the patient.  Illness Severity:  Moderate Onset quality:  Gradual Duration:  2 days Timing:  Constant Progression:  Worsening Chronicity:  New Associated symptoms: no abdominal pain, no chest pain, no congestion, no diarrhea, no fever, no headaches, no myalgias, no rash, no shortness of breath and no vomiting       Past Medical History:  Diagnosis Date   Anxiety    Automatic implantable cardiac defibrillator [employer]    Analyze ST   Benign prostatic hypertrophy    Benzodiazepine dependence (HCC)    chronic   CAD (coronary artery disease)    Last cath 2/12. 3-v CAD. Failed PCI of distal RCAc  CATH [facility] 4/13 with DES to  LAD X2   CHF (congestive heart failure) (HCC)    Chronic back pain    lumbar stenosis   Chronic systolic heart failure (HCC)    EF 20-25%. s/p ST[employer] Jude ICD   Depression    DJD (degenerative joint disease)    History of testicular cancer    Ischemic cardiomyopathy    Myocardial infarction [facility])    Narcotic dependence ([place])    chronic   OSA on CPAP    Polycythemia, secondary    S/p hematology consultation for polycythema on 01/06/18.  S/p bone marrow biopsy on 12/31/17 that was negative for  myeloproliferative disorder.  Feels secondary to sleep apnea, active smoking, testosterone supplementation, nocturnal hypoxia   TIA (transient ischemic attack)    Vitamin B12 deficiency 07/22/2016    Patient Active Problem List   Diagnosis Date Noted   Mechanical breakdown of cardiac pulse generator 02/13/2021   Rib pain on right side 01/17/2019   Acute respiratory failure ([place]) 09/29/2018   Drug overdose, accidental or unintentional, initial encounter 09/29/2018   Polycythemia, secondary    Chronic nausea 08/05/2018   Cough 03/15/2018   Right lower lobe pneumonia 03/15/2018   Tobacco dependence 03/15/2018   Marijuana use, episodic 03/15/2018   Hypoxia 03/11/2018   Acute on chronic respiratory failure with hypoxia ([place]) 03/11/2018   Acute on chronic congestive heart failure ([place])    Hypogonadism in male 01/16/2018   Vitamin B12 deficiency 01/16/2018   Central sleep apnea 07/27/2017   Treatment-emergent central sleep apnea 07/27/2017   Chronic respiratory failure with hypoxia ([place]) 07/27/2017   COPD  GOLD 0 01/02/2017   Hemoptysis 01/01/2017   Memory difficulty 07/22/2016   Constipation 09/04/2014   Unstable angina (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/2011   Ventricular tachycardia ([place]) 06/19/2011   Testicular cancer ([place]) 05/22/2011   PAD (peripheral artery disease) ([place]) 02/24/2011   Dual implantable cardioverter-defibrillator in situ 12/24/2010   Depression 12/09/2010   cHistory of testicular cancer 12/09/2010   Benzodiazepine dependence ([place]) 12/09/2010   Narcotic dependence ([place]) 12/09/2010   Chronic back pain 12/05/2010   Anxiety disorder  10/17/2010   CAD, NATIVE VESSEL 09/15/2010   COMBINED HEART FAILURE, CHRONIC 09/15/2010   Ischemic cardiomyopathy 09/12/2010    Past Surgical History:  Procedure Laterality Date   ASD REPAIR, SINUS VENOSUS     CARDIAC DEFIBRILLATOR PLACEMENT  08/2010   HERNIA REPAIR     LEFT HEART CATHETERIZATION WITH CORONARY ANGIOGRAM N/A 06/14/2014   Procedure: LEFT HEART CATHETERIZATION WITH CORONARY ANGIOGRAM;  Surgeon: Jolaine Artist, MD;  Location: [facility] CATH LAB;  Service: Cardiovascular;  Laterality: N/A;   TESTICLE SURGERY     testicular cancer surgery left testicle removed       Family History  Problem Relation Age of Onset   Heart disease Father 58       Died of MI   Cancer Mother     Social History   Tobacco Use   Smoking status: Every Day    Packs/day: 0.50    Years: 29.00    Pack years: 14.50    Types: Cigarettes, E-cigarettes    Last attempt to quit: 07/14/2015    Years since quitting: 5.5   Smokeless tobacco: Never   Tobacco comments:    no cigarette in 4 days but yes to e-cig  Vaping Use   Vaping Use: Every day   Start date: 07/14/2015  Substance Use Topics   Alcohol use: No    Alcohol/week: 0.0 standard drinks   Drug use: Yes    Types: Marijuana    Comment: Urine showed THC    Home Medications Prior to Admission medications   Medication Sig Start Date End Date Taking? Authorizing Provider  albuterol (VENTOLIN HFA) 108 (90 Base) MCG/ACT inhaler Inhale 2 puffs into the lungs every 6 (six) hours as needed for wheezing or shortness of breath. 10/01/20   [name], NP  ALPRAZolam Duanne Moron) 1 MG tablet Take 1 mg by mouth 4 (four) times daily.     [provider]  carvedilol (COREG) 25 MG tablet Take 25 mg by mouth 2 (two) times daily with a meal.    [provider]  clopidogrel (PLAVIX) 75 MG tablet TAKE ONE TABLET BY MOUTH DAILY 12/23/20   [name], NP  cyanocobalamin (,VITAMIN B-12,) 1000 MCG/ML injection Inject 1 mL (1,000 mcg total) into  the muscle every 30 (thirty) days. 10/03/20   [name], NP  dapagliflozin propanediol (FARXIGA) 10 MG TABS tablet Take 1 tablet (10 mg total) by mouth daily before breakfast. 10/01/20   [name], NP  diazepam (VALIUM) 10 MG tablet Take 20 mg by mouth every 12 (twelve) hours as needed for anxiety.  06/04/17   [provider]  diclofenac (VOLTAREN) 75 MG EC tablet Take 1 tablet (75 mg total) by mouth 2 (two) times daily. 01/25/20   Posey Boyer, MD  doxycycline (VIBRAMYCIN) 100 MG capsule Take 1 capsule (100 mg total) by mouth 2 (two) times daily. 01/25/20   Posey Boyer, MD  erythromycin ophthalmic ointment Place 1 application into the right eye at bedtime. 03/03/19  Jacelyn Pi, [name], MD  ezetimibe (ZETIA) 10 MG tablet TAKE 1 TABLET EACH DAY. 10/01/20   [name], NP  furosemide (LASIX) 20 MG tablet Take 1 tablet (20 mg total) by mouth daily. 10/24/20   [name], NP  ibuprofen (ADVIL,MOTRIN) 200 MG tablet Take 800 mg by mouth every 6 (six) hours as needed for moderate pain.    [provider]  lactulose (CHRONULAC) 10 GM/15ML solution Take 15 mLs (10 g total) by mouth 2 (two) times daily as needed for mild constipation. 10/01/20   [name], NP  lisinopril (PRINIVIL,ZESTRIL) 5 MG tablet Take 5 mg by mouth daily. 07/20/18   [provider]  loperamide (IMODIUM A-D) 2 MG tablet Take 1.5 tablets (3 mg total) by mouth 4 (four) times daily as needed for diarrhea or loose stools. 10/01/20   [name], NP  losartan (COZAAR) 25 MG tablet Take 1 tablet (25 mg total) by mouth daily. 10/01/20   [name], NP  methadone (METHADOSE) 40 MG disintegrating tablet Take 200 mg by mouth daily. [facility]    [provider]  methocarbamol (ROBAXIN) 750 MG tablet TAKE 1 OR 2 TABLETS THREE TIMES A DAY AS NEEDED. 10/01/20   [name], NP  naproxen (NAPROSYN) 500 MG tablet Take 1 tablet (500 mg total) by mouth 2 (two) times  daily as needed for moderate pain. 01/09/20   Jacelyn Pi, [name], MD  nitroGLYCERIN (NITROLINGUAL) 0.4 MG/SPRAY spray Place 2 sprays under the tongue every 5 (five) minutes x 3 doses as needed for chest pain.  03/20/15   [provider]  ondansetron (ZOFRAN-ODT) 8 MG disintegrating tablet Take 1 tablet (8 mg total) by mouth every 8 (eight) hours as needed for nausea or vomiting. 10/01/20   [name], NP  oxymetazoline (AFRIN) 0.05 % nasal spray Place 1 spray into both nostrils 2 (two) times daily.    [provider]  pantoprazole (PROTONIX) 40 MG tablet Take 1 tablet (40 mg total) by mouth daily. 10/01/20   [name], NP  polyethylene glycol powder (GLYCOLAX/MIRALAX) 17 GM/SCOOP powder Take 17 g by mouth 2 (two) times daily as needed for mild constipation.    [provider]  rosuvastatin (CRESTOR) 40 MG tablet Take 1 tablet (40 mg total) by mouth every evening. 10/01/20   [name], NP  sildenafil (REVATIO) 20 MG tablet TAKE 5 TABLETS AS NEEDED. 10/01/20   [name], NP  sildenafil (VIAGRA) 100 MG tablet Take 0.5 tablets (50 mg total) by mouth daily as needed for erectile dysfunction. 09/30/20   [name], NP  spironolactone (ALDACTONE) 25 MG tablet TAKE (1/2) TABLET DAILY. 10/01/20   [name], NP  sucralfate (CARAFATE) 1 g tablet Take 1 tablet (1 g total) by mouth 4 (four) times daily -  with meals and at bedtime. 03/14/18   [place], Carley D, DO  sulfamethoxazole-trimethoprim (BACTRIM DS) 800-160 MG tablet Take 1 tablet by mouth 2 (two) times daily. 10/24/20   [name], NP  tamsulosin (FLOMAX) 0.4 MG CAPS capsule Take 1 capsule (0.4 mg total) by mouth daily after breakfast. 10/03/20   [name], NP  tamsulosin [facility]) 0.4 MG CAPS capsule Take 1 capsule (0.4 mg total) by mouth daily after breakfast. 10/01/20   [name], NP  TESTOSTERONE CYPIONATE IM Inject into the muscle every 14 (fourteen) days.    [provider]   torsemide 40 MG TABS Take 20 mg by mouth daily as needed. For swelling in legs  10/01/20   [name], NP  traZODone (DESYREL) 100 MG tablet Take 1 tablet (100 mg total) by mouth at bedtime. 10/01/20   [name], NP  triamcinolone (NASACORT AQ) 55 MCG/ACT AERO nasal inhaler Place 1 spray into the nose daily. 10/01/20   [name], NP  Vitamin D, Ergocalciferol, (DRISDOL) 1.25 MG (50000 UNIT) CAPS capsule Take 1 capsule (50,000 Units total) by mouth every 7 (seven) days. 10/01/20   [name], NP  zolpidem (AMBIEN) 10 MG tablet Take 10 mg by mouth at bedtime.  07/11/18   [provider]    Allergies    Darvocet [propoxyphene n-acetaminophen]  Review of Systems   Review of Systems  Constitutional:  Negative for chills and fever.  HENT:  Negative for congestion and facial swelling.   Eyes:  Negative for discharge and visual disturbance.  Respiratory:  Negative for shortness of breath.   Cardiovascular:  Negative for chest pain and palpitations.  Gastrointestinal:  Negative for abdominal pain, diarrhea and vomiting.  Musculoskeletal:  Negative for arthralgias and myalgias.  Skin:  Negative for color change and rash.  Neurological:  Negative for tremors, syncope and headaches.  Psychiatric/Behavioral:  Negative for confusion and dysphoric mood.    Physical Exam Updated Vital Signs BP 107/61   Pulse 88   Resp (!) 23   Ht 5' 11"  (1.803 m)   Wt 79 kg   SpO2 100%   BMI 24.29 kg/m   Physical Exam Vitals and nursing note reviewed.  Constitutional:      Appearance: He is well-developed.     Comments: Chronically ill appearing  HENT:     Head: Normocephalic and atraumatic.  Eyes:     Pupils: Pupils are equal, round, and reactive to light.  Neck:     Vascular: No JVD.  Cardiovascular:     Rate and Rhythm: Normal rate and regular rhythm.     Heart sounds: No murmur heard.   No friction rub. No gallop.  Pulmonary:     Effort: No respiratory distress.      Breath sounds: No wheezing.  Abdominal:     General: There is no distension.     Tenderness: There is no abdominal tenderness. There is no guarding or rebound.  Musculoskeletal:        General: Normal range of motion.     Cervical back: Normal range of motion and neck supple.  Skin:    Coloration: Skin is not pale.     Findings: No rash.  Neurological:     Mental Status: He is alert and oriented to person, place, and time.     Comments: Slurring his words, clinically intoxicated  Psychiatric:        Behavior: Behavior normal.    ED Results / Procedures / Treatments   Labs (all labs ordered are listed, but only abnormal results are displayed) Labs Reviewed  CBC WITH DIFFERENTIAL/PLATELET - Abnormal; Notable for the following components:      Result Value   MCV 100.7 (*)    Platelets 144 (*)    All other components within normal limits  BASIC METABOLIC PANEL - Abnormal; Notable for the following components:   Glucose, Bld 145 (*)    Creatinine, Ser 1.47 (*)    GFR, Estimated 54 (*)    All other components within normal limits  BRAIN NATRIURETIC PEPTIDE - Abnormal; Notable for the following components:   B Natriuretic Peptide 212.1 (*)    All other components within normal limits  TROPONIN I (HIGH SENSITIVITY) - Abnormal; Notable for the following components:   Troponin I (High Sensitivity) 20 (*)    All other components within normal limits  CULTURE, BLOOD (ROUTINE X 2)  CULTURE, BLOOD (ROUTINE X 2)  RESP PANEL BY RT-PCR (FLU A&B, COVID) ARPGX2  MAGNESIUM  ETHANOL  LACTIC ACID, PLASMA  LACTIC ACID, PLASMA  CBC  MAGNESIUM  APTT  PROTIME-INR  BASIC METABOLIC PANEL    EKG EKG Interpretation  Date/Time:  Thursday February 13 2021 21:04:14 EDT Ventricular Rate:  62 PR Interval:  153 QRS Duration: 127 QT Interval:  544 QTC Calculation: 553 R Axis:   76 Text Interpretation: Sinus rhythm Nonspecific intraventricular conduction delay Borderline abnrm T, anterolateral  leads No significant change since last tracing Confirmed by Deno Etienne [phone]) on 02/13/2021 9:19:42 PM  Radiology DG Chest Port 1 View  Result Date: 02/13/2021 CLINICAL DATA:  defibrillator has gone off 5 times today EXAM: PORTABLE CHEST 1 VIEW COMPARISON:  CT chest 03/11/2018, x-ray chest 01/16/2019 ribs, chest x-ray 01/16/2019 FINDINGS: The heart size and mediastinal contours are within normal limits. Left chest wall dual cardiac pacemaker/defibrillator with leads in similar position. Bibasilar patchy airspace opacities. Coarsened interstitial markings. No pleural effusion. No pneumothorax. No acute osseous abnormality. IMPRESSION: Bibasilar patchy interstitial and airspace opacities that may represent pulmonary edema versus infection/inflammation. Electronically Signed   By: Iven Finn M.D.   On: 02/13/2021 21:55    Procedures Procedures   Medications Ordered in ED Medications  ondansetron (ZOFRAN) injection 4 mg (has no administration in time range)  sodium chloride 0.9 % bolus 500 mL (0 mLs Intravenous Stopped 02/13/21 2245)  sodium chloride 0.9 % bolus 500 mL (0 mLs Intravenous Stopped 02/13/21 2315)  sodium chloride 0.9 % bolus 1,000 mL ( Intravenous Infusion Verify 02/14/21 0002)  naloxone [facility]) injection 0.2 mg (0.2 mg Intravenous Given 02/13/21 2319)  naloxone (NARCAN) injection 0.4 mg (0.4 mg Intravenous Given 02/13/21 2352)    ED Course  I have reviewed the triage vital signs and the nursing notes.  Pertinent labs & imaging results that were available during my care of the patient were reviewed by me and considered in my medical decision making (see chart for details).    MDM Rules/Calculators/A&P                           60 yo M with a cc of defib firing.  This time at least 5 times a day he said.  He is chronically ill-appearing.  Blood pressure is quite low.  He tells me that is normal for him.  He also appears to be intoxicated and he does admit to taking a delta 8 gummy  earlier.  Patient became very sleepy and now is sleeping pretty soundly in the room.  His blood pressure is also trended downward to bed.  I think this is likely secondary to his chronic narcotic use and likely his recreational use as well.  We will give him fluids slowly as he has a history of heart failure with an EF of 40%.  Patient given 2 L of fluids slowly.  No significant improvement of his blood pressure respiratory rate remains quite low.  Given 0.2 of Narcan with some improvement of respiratory rate and blood pressure.  Blood pressure remains low and he was given another 0.4 of Narcan.  Now the patient is awake and alert and his blood pressure is improved.  Cards admit.  CRITICAL CARE Performed by: Cecilio Asper   Total critical care time: 35 minutes  Critical care time was exclusive of separately billable procedures and treating other patients.  Critical care was necessary to treat or prevent imminent or life-threatening deterioration.  Critical care was time spent personally by me on the following activities: development of treatment plan with patient and/or surrogate as well as nursing, discussions with consultants, evaluation of patient's response to treatment, examination of patient, obtaining history from patient or surrogate, ordering and performing treatments and interventions, ordering and review of laboratory studies, ordering and review of radiographic studies, pulse oximetry and re-evaluation of patient's condition.  The patients results and plan were reviewed and discussed.   Any x-rays performed were independently reviewed by myself.   Differential diagnosis were considered with the presenting HPI.  Medications  ondansetron (ZOFRAN) injection 4 mg (has no administration in time range)  sodium chloride 0.9 % bolus 500 mL (0 mLs Intravenous Stopped 02/13/21 2245)  sodium chloride 0.9 % bolus 500 mL (0 mLs Intravenous Stopped 02/13/21 2315)  sodium chloride 0.9 %  bolus 1,000 mL ( Intravenous Infusion Verify 02/14/21 0002)  naloxone [facility]) injection 0.2 mg (0.2 mg Intravenous Given 02/13/21 2319)  naloxone [facility]) injection 0.4 mg (0.4 mg Intravenous Given 02/13/21 2352)    Vitals:   02/13/21 2335 02/13/21 2340 02/13/21 2345 02/14/21 0000  BP:  (!) 72/43 (!) 75/42 107/61  Pulse: (!) 54 (!) 57 (!) 57 88  Resp: 14 20 19  (!) 23  SpO2: 97% 96% 99% 100%  Weight:      Height:        Final diagnoses:  Mechanical breakdown of cardiac pulse generator, initial encounter  Opioid overdose, accidental or unintentional, initial encounter [facility])    Admission/ observation were discussed with the admitting physician, patient and/or family and they are comfortable with the plan.    Final Clinical Impression(s) / ED Diagnoses Final diagnoses:  Mechanical breakdown of cardiac pulse generator, initial encounter  Opioid overdose, accidental or unintentional, initial encounter [facility])    Rx / DC Orders ED Discharge Orders     None        Deno Etienne, DO 02/14/21 0007

## 2021-02-13 NOTE — Telephone Encounter (Signed)
   Pt called because his defibrillator has gone off multiple times today.   He is in a wheelchair constantly.   He has caregivers.  His wife canceled his insurance.  His defibrillator has gone off multiple times today.   He has had pain down his arm and his jaw 2 days ago.  He has polycythemia vera, having problems with that.   He is concerned that he will be turned away because he does not have insurance.  He does not drive.  Emphasized that he needs to call 911 and come to the hospital.  He is concerned that he needs a new heart.  He is concerned that he was dying, I reemphasized that we would not know what was going on that made his defibrillator fire until he came in and we could get it interrogated.  Reiterated that he needs to call 911 and come to the ER.  Strongly requested that he not have anyone drive him because if his defibrillator fired while someone was driving him, they might have an accident.  Rosaria Ferries, PA-C 02/13/2021 6:04 PM

## 2021-02-14 DIAGNOSIS — G4733 Obstructive sleep apnea (adult) (pediatric): Secondary | ICD-10-CM

## 2021-02-14 DIAGNOSIS — T82519A Breakdown (mechanical) of unspecified cardiac and vascular devices and implants, initial encounter: Secondary | ICD-10-CM | POA: Diagnosis present

## 2021-02-14 DIAGNOSIS — I959 Hypotension, unspecified: Secondary | ICD-10-CM

## 2021-02-14 DIAGNOSIS — T82111A Breakdown (mechanical) of cardiac pulse generator (battery), initial encounter: Principal | ICD-10-CM

## 2021-02-14 DIAGNOSIS — Z95810 Presence of automatic (implantable) cardiac defibrillator: Secondary | ICD-10-CM

## 2021-02-14 DIAGNOSIS — I5042 Chronic combined systolic (congestive) and diastolic (congestive) heart failure: Secondary | ICD-10-CM

## 2021-02-14 DIAGNOSIS — T402X1A Poisoning by other opioids, accidental (unintentional), initial encounter: Secondary | ICD-10-CM

## 2021-02-14 DIAGNOSIS — G9340 Encephalopathy, unspecified: Secondary | ICD-10-CM

## 2021-02-14 DIAGNOSIS — Z9989 Dependence on other enabling machines and devices: Secondary | ICD-10-CM

## 2021-02-14 DIAGNOSIS — I952 Hypotension due to drugs: Secondary | ICD-10-CM

## 2021-02-14 DIAGNOSIS — R4182 Altered mental status, unspecified: Secondary | ICD-10-CM

## 2021-02-14 LAB — BASIC METABOLIC PANEL
Anion gap: 6 (ref 5–15)
BUN: 22 mg/dL (ref 8–23)
CO2: 25 mmol/L (ref 22–32)
Calcium: 8.2 mg/dL — ABNORMAL LOW (ref 8.9–10.3)
Chloride: 107 mmol/L (ref 98–111)
Creatinine, Ser: 1.25 mg/dL — ABNORMAL HIGH (ref 0.61–1.24)
GFR, Estimated: >60 mL/min (ref >60)
Glucose, Bld: 112 mg/dL — ABNORMAL HIGH (ref 70–99)
Potassium: 4.3 mmol/L (ref 3.5–5.1)
Sodium: 138 mmol/L (ref 135–145)

## 2021-02-14 LAB — MRSA NEXT GEN BY PCR, NASAL: MRSA by PCR Next Gen: NOT DETECTED

## 2021-02-14 LAB — GLUCOSE, CAPILLARY: Glucose-Capillary: 107 mg/dL — ABNORMAL HIGH (ref 70–99)

## 2021-02-14 LAB — CBC
HCT: 37.3 % — ABNORMAL LOW (ref 39.0–52.0)
Hemoglobin: 12.7 g/dL — ABNORMAL LOW (ref 13.0–17.0)
MCH: 34.1 pg — ABNORMAL HIGH (ref 26.0–34.0)
MCHC: 34 g/dL (ref 30.0–36.0)
MCV: 100.3 fL — ABNORMAL HIGH (ref 80.0–100.0)
Platelets: 104 10*3/uL — ABNORMAL LOW (ref 150–400)
RBC: 3.72 MIL/uL — ABNORMAL LOW (ref 4.22–5.81)
RDW: 13.2 % (ref 11.5–15.5)
WBC: 4.1 10*3/uL (ref 4.0–10.5)
nRBC: 0 % (ref 0.0–0.2)

## 2021-02-14 LAB — RESP PANEL BY RT-PCR (FLU A&B, COVID) ARPGX2
Influenza A by PCR: NEGATIVE
Influenza B by PCR: NEGATIVE
SARS Coronavirus 2 by RT PCR: NEGATIVE

## 2021-02-14 LAB — BRAIN NATRIURETIC PEPTIDE: B Natriuretic Peptide: 212.1 pg/mL — ABNORMAL HIGH (ref 0.0–100.0)

## 2021-02-14 LAB — LACTIC ACID, PLASMA: Lactic Acid, Venous: 1.1 mmol/L (ref 0.5–1.9)

## 2021-02-14 LAB — PROTIME-INR
INR: 1.2 (ref 0.8–1.2)
Prothrombin Time: 14.7 seconds (ref 11.4–15.2)

## 2021-02-14 LAB — MAGNESIUM: Magnesium: 1.8 mg/dL (ref 1.7–2.4)

## 2021-02-14 LAB — APTT: aPTT: 30 seconds (ref 24–36)

## 2021-02-14 MED ORDER — NALOXONE HCL 0.4 MG/ML IJ SOLN
0.4000 mg | Freq: Once | INTRAMUSCULAR | Status: AC
  Administered 2021-02-14: 0.4 mg via INTRAVENOUS

## 2021-02-14 MED ORDER — ADULT MULTIVITAMIN W/MINERALS CH
1.0000 | ORAL_TABLET | Freq: Every day | ORAL | Status: DC
  Administered 2021-02-14: 1 via ORAL
  Filled 2021-02-14: qty 1

## 2021-02-14 MED ORDER — DAPAGLIFLOZIN PROPANEDIOL 10 MG PO TABS: 10.0000 mg | ORAL_TABLET | Freq: Every day | ORAL | Status: DC

## 2021-02-14 MED ORDER — NALOXONE HCL 0.4 MG/ML IJ SOLN
0.4000 mg | Freq: Once | INTRAMUSCULAR | Status: AC
  Administered 2021-02-14: 0.4 mg via INTRAVENOUS
  Filled 2021-02-14: qty 1

## 2021-02-14 MED ORDER — CARVEDILOL 3.125 MG PO TABS: 3.1250 mg | ORAL_TABLET | Freq: Two times a day (BID) | ORAL | Status: DC

## 2021-02-14 MED ORDER — NALOXONE HCL 4 MG/10ML IJ SOLN
1.5000 mg/h | INTRAVENOUS | Status: DC
  Administered 2021-02-14 (×2): 1.5 mg/h via INTRAVENOUS
  Filled 2021-02-14 (×2): qty 10

## 2021-02-14 MED ORDER — ENSURE ENLIVE PO LIQD
237.0000 mL | Freq: Two times a day (BID) | ORAL | Status: DC
  Administered 2021-02-14: 237 mL via ORAL

## 2021-02-14 NOTE — ED Notes (Signed)
Assume care on pt at this time, primary nurse unable to give me report at this time. Pt found on bed no responding to verbal stimulus, with SBP on the 70's, report gotten from ED provider Dr. Tyrone Nine. Fluids and Narcan given as ordered by Dr. Tyrone Nine. Blood cultures and lactic collected as ordered. Pt resting on bed with bilateral rails up for safety on continues vital sign monitor.

## 2021-02-14 NOTE — ED Notes (Signed)
Critical care provider at the bedside.

## 2021-02-14 NOTE — H&P (Signed)
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 Upon arrival he has been hypotensive, bradycardic, and confused. His hemodynamics and responsiveness have improved with narcan x 3 doses. PCCM was consulted for assistance with managing his narcan infusion in the ICU. He will not say where his methadone clinic is, but he reports only taking it 3 days per week. He does not think his current symptoms are related to his methadone. His psychiatrist has prescribed him xanax, valium, and Ambien. He has powdered fentanyl, but has not taken it recently. He denies other opiate use recently. He has not taken his lisinopril recently due to low BPs but has remained on his coreg.  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 rate (!) 0, height 5' 11"  (1.803 m), weight 79 kg, SpO2 98 %.        Intake/Output Summary (Last 24 hours) at 02/14/2021 0437 Last data filed at 02/14/2021 0123 Gross per 24 hour  Intake 2000.08 ml  Output --  Net 2000.08 ml   Filed Weights   02/13/21 2321  Weight: 79 kg    Examination: General: chronically ill appearing man lying in bed in NAD HENT: /AT, eyes  anicteric Lungs: bradypnic, but deep breaths, CTAB. Cardiovascular: S1S2, bradycardic Abdomen: soft, NT Extremities: ankle edema on the left (he attributes to polycythemia vera), no RLE edema. R ankle plantar flexion. No cyanosis. Neuro: Sleepy but arousable to significant stimulation. Slightly slurred speech. Moving extremities but not able to move R ankle-- he reports this is due to PCV being uncontrolled. Derm: warm, dry, no rashes  EKG- QTC 493  Resolved Hospital Problem list     Assessment & Plan:  Hypotension, acute metabolic encephalopathy, bradycardia due to methadone overdose and polypharmacy--- UDS positive for benzos and THC plus he takes chronic methadone. Has responded to narcan -Agree with narcan infusion. Duration required may be prolonged due to variable half life of methadone. Additional bolus dose now given delay in getting drip. -checking blood fentanyl level (send out lab) -holding benzos for now. These are probably something he should be weaned off of due to long term risk of interactions.  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 Reports intermittent use. -unclear what PTA setting was -can con't CPAP here when sleeping  Prolonged Qtc, probably due to methadone -monitor -avoid other QT prolonging meds  Best Practice (right click and "Reselect all SmartList  Selections" daily)   Per primary  Labs   CBC: Recent Labs  Lab 02/13/21 2106  WBC 5.4  NEUTROABS 2.9  HGB 14.7  HCT 44.4  MCV 100.7*  PLT 144*    Basic Metabolic Panel: Recent Labs  Lab 02/13/21 2106  NA 136  K 3.7  CL 101  CO2 26  GLUCOSE 145*  BUN 23  CREATININE 1.47*  CALCIUM 9.1  MG 2.1    GFR: Estimated Creatinine Clearance: 56.2 mL/min (A) (by C-G formula based on SCr of 1.47 mg/dL (H)). Recent Labs  Lab 02/13/21 2106 02/13/21 2311 02/14/21 0229  WBC 5.4  --   --   LATICACIDVEN  --  1.4 1.1    Liver Function Tests: No results for input(s): AST, ALT, ALKPHOS, BILITOT, PROT, ALBUMIN in the last 168 hours. No results for input(s): LIPASE, AMYLASE in the last 168 hours. No results for input(s): AMMONIA in the last 168 hours.  ABG    Component Value Date/Time   HCO3 25.6 (H) 08/07/2010 1656   TCO2 27 08/07/2010 1656   O2SAT 97.0 08/07/2010 1656     Coagulation Profile: No results for input(s): INR, PROTIME in the last 168 hours.  Cardiac Enzymes: No results for input(s): CKTOTAL, CKMB, CKMBINDEX, TROPONINI in the last 168 hours.  HbA1C: Hgb A1c MFr Bld  Date/Time Value Ref Range Status  08/23/2020 11:54 AM 5.8 (H) 4.8 - 5.6 % Final    Comment:             Prediabetes: 5.7 - 6.4          Diabetes: >6.4          Glycemic control for adults with diabetes: <7.0   07/19/2020 04:40 PM 5.7 (H) 4.8 - 5.6 % Final    Comment:             Prediabetes: 5.7 - 6.4          Diabetes: >6.4          Glycemic control for adults with diabetes: <7.0     CBG: No results for input(s): GLUCAP in the last 168 hours.  Review of Systems:   Limited due to encephalopathy  Past Medical History:  He,  has a past medical history of Anxiety, Automatic implantable cardiac defibrillator [place], Benign prostatic hypertrophy, Benzodiazepine dependence ([place]), CAD (coronary artery disease), CHF (congestive heart failure) ([place]), Chronic back pain, Chronic systolic heart failure ([place]), Depression, DJD (degenerative joint disease), History of testicular cancer, Ischemic cardiomyopathy, Myocardial infarction ([place]), Narcotic dependence ([place]), OSA on CPAP, Polycythemia, secondary, TIA (transient ischemic attack), and Vitamin B12 deficiency (07/22/2016).   Surgical History:   Past  Surgical History:  Procedure Laterality Date   ASD REPAIR, SINUS VENOSUS     CARDIAC DEFIBRILLATOR PLACEMENT  08/2010   HERNIA REPAIR     LEFT HEART CATHETERIZATION WITH CORONARY ANGIOGRAM N/A 06/14/2014   Procedure: LEFT HEART CATHETERIZATION WITH CORONARY ANGIOGRAM;  Surgeon: Jolaine Artist, MD;  Location: [facility] CATH LAB;  Service: Cardiovascular;  Laterality: N/A;   TESTICLE SURGERY     testicular cancer surgery left testicle removed     Social History:   reports that he has been smoking cigarettes and e-cigarettes. He has a 14.50 pack-year smoking history. He has never used smokeless tobacco. He reports current drug use. Drug: Marijuana. He reports that he does not drink alcohol.   Family History:  His family history includes Cancer in his mother; Heart disease (age of onset: 78) in  his father.   Allergies Allergies  Allergen Reactions   Darvocet [Propoxyphene N-Acetaminophen] Anaphylaxis    Throat closes     Home Medications  Prior to Admission medications   Medication Sig Start Date End Date Taking? Authorizing Provider  albuterol (VENTOLIN HFA) 108 (90 Base) MCG/ACT inhaler Inhale 2 puffs into the lungs every 6 (six) hours as needed for wheezing or shortness of breath. 10/01/20   [name], NP  ALPRAZolam Duanne Moron) 1 MG tablet Take 1 mg by mouth 4 (four) times daily.     [provider]  carvedilol (COREG) 25 MG tablet Take 25 mg by mouth 2 (two) times daily with a meal.    [provider]  clopidogrel (PLAVIX) 75 MG tablet TAKE ONE TABLET BY MOUTH DAILY 12/23/20   [name], NP  cyanocobalamin (,VITAMIN B-12,) 1000 MCG/ML injection Inject 1 mL (1,000 mcg total) into the muscle every 30 (thirty) days. 10/03/20   [name], NP  dapagliflozin propanediol (FARXIGA) 10 MG TABS tablet Take 1 tablet (10 mg total) by mouth daily before breakfast. 10/01/20   [name], NP  diazepam (VALIUM) 10 MG tablet Take 20 mg by mouth every 12 (twelve) hours as  needed for anxiety.  06/04/17   [provider]  diclofenac (VOLTAREN) 75 MG EC tablet Take 1 tablet (75 mg total) by mouth 2 (two) times daily. 01/25/20   Posey Boyer, MD  doxycycline (VIBRAMYCIN) 100 MG capsule Take 1 capsule (100 mg total) by mouth 2 (two) times daily. 01/25/20   Posey Boyer, MD  erythromycin ophthalmic ointment Place 1 application into the right eye at bedtime. 03/03/19   Jacelyn Pi, [name], MD  ezetimibe (ZETIA) 10 MG tablet TAKE 1 TABLET EACH DAY. 10/01/20   [name], NP  furosemide (LASIX) 20 MG tablet Take 1 tablet (20 mg total) by mouth daily. 10/24/20   [name], NP  ibuprofen (ADVIL,MOTRIN) 200 MG tablet Take 800 mg by mouth every 6 (six) hours as needed for moderate pain.    [provider]  lactulose (CHRONULAC) 10 GM/15ML solution Take 15 mLs (10 g total) by mouth 2 (two) times daily as needed for mild constipation. 10/01/20   [name], NP  lisinopril (PRINIVIL,ZESTRIL) 5 MG tablet Take 5 mg by mouth daily. 07/20/18   [provider]  loperamide (IMODIUM A-D) 2 MG tablet Take 1.5 tablets (3 mg total) by mouth 4 (four) times daily as needed for diarrhea or loose stools. 10/01/20   [name], NP  losartan (COZAAR) 25 MG tablet Take 1 tablet (25 mg total) by mouth daily. 10/01/20   [name], NP  methadone (METHADOSE) 40 MG disintegrating tablet Take 200 mg by mouth daily. [facility]    [provider]  methocarbamol (ROBAXIN) 750 MG tablet TAKE 1 OR 2 TABLETS THREE TIMES A DAY AS NEEDED. 10/01/20   [name], NP  naproxen (NAPROSYN) 500 MG tablet Take 1 tablet (500 mg total) by mouth 2 (two) times daily as needed for moderate pain. 01/09/20   Jacelyn Pi, [name], MD  nitroGLYCERIN (NITROLINGUAL) 0.4 MG/SPRAY spray Place 2 sprays under the tongue every 5 (five) minutes x 3 doses as needed for chest pain.  03/20/15   [provider]  ondansetron (ZOFRAN-ODT) 8 MG  disintegrating tablet Take 1 tablet (8 mg total) by mouth every 8 (eight) hours as needed for nausea or vomiting. 10/01/20   [name], NP  oxymetazoline (AFRIN) 0.05 % nasal spray  Place 1 spray into both nostrils 2 (two) times daily.    [provider]  pantoprazole (PROTONIX) 40 MG tablet Take 1 tablet (40 mg total) by mouth daily. 10/01/20   [name], NP  polyethylene glycol powder (GLYCOLAX/MIRALAX) 17 GM/SCOOP powder Take 17 g by mouth 2 (two) times daily as needed for mild constipation.    [provider]  rosuvastatin (CRESTOR) 40 MG tablet Take 1 tablet (40 mg total) by mouth every evening. 10/01/20   [name], NP  sildenafil (REVATIO) 20 MG tablet TAKE 5 TABLETS AS NEEDED. 10/01/20   [name], NP  sildenafil (VIAGRA) 100 MG tablet Take 0.5 tablets (50 mg total) by mouth daily as needed for erectile dysfunction. 09/30/20   [name], NP  spironolactone (ALDACTONE) 25 MG tablet TAKE (1/2) TABLET DAILY. 10/01/20   [name], NP  sucralfate (CARAFATE) 1 g tablet Take 1 tablet (1 g total) by mouth 4 (four) times daily -  with meals and at bedtime. 03/14/18   [place], Carley D, DO  sulfamethoxazole-trimethoprim (BACTRIM DS) 800-160 MG tablet Take 1 tablet by mouth 2 (two) times daily. 10/24/20   [name], NP  tamsulosin (FLOMAX) 0.4 MG CAPS capsule Take 1 capsule (0.4 mg total) by mouth daily after breakfast. 10/03/20   [name], NP  tamsulosin [facility]) 0.4 MG CAPS capsule Take 1 capsule (0.4 mg total) by mouth daily after breakfast. 10/01/20   [name], NP  TESTOSTERONE CYPIONATE IM Inject into the muscle every 14 (fourteen) days.    [provider]  torsemide 40 MG TABS Take 20 mg by mouth daily as needed. For swelling in legs 10/01/20   [name], NP  traZODone (DESYREL) 100 MG tablet Take 1 tablet (100 mg total) by mouth at bedtime. 10/01/20   [name], NP  triamcinolone (NASACORT AQ) 55 MCG/ACT AERO nasal  inhaler Place 1 spray into the nose daily. 10/01/20   [name], NP  Vitamin D, Ergocalciferol, (DRISDOL) 1.25 MG (50000 UNIT) CAPS capsule Take 1 capsule (50,000 Units total) by mouth every 7 (seven) days. 10/01/20   [name], NP  zolpidem (AMBIEN) 10 MG tablet Take 10 mg by mouth at bedtime.  07/11/18   [provider]     Critical care time: 44 min.    Julian Hy, DO 02/14/21 5:31 AM [facility]

## 2021-02-14 NOTE — Progress Notes (Signed)
Patient requesting to leave AMA, attending MD notified. AMA form signed.

## 2021-02-14 NOTE — H&P (Addendum)
Cardiology Admission History and Physical:   Patient ID: [name] MRN: [id number]; DOB: Jan 17, 1961   Admission date: 02/13/2021  PCP:  [name], NP   [facility] Providers Cardiologist:  None  Advanced Heart Failure:  Glori Bickers, MD       Chief Complaint:  ICD Alarms  Patient Profile:   [name] is a 60 y.o. male with severe coronary artery disease, ischemic cardiomyopathy/heart failure (EF 40-45% on TTE in 07/2019) s/p St. 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  History of Present Illness:  HPI is limited because patient is currently acutely altered.  History is obtained primarily from the chart and emergency department handoff.  [name] apparently called in on 02/13/2021 to the cardiology help center concerned that his defibrillator had gone off multiple times he was "dying".  He was eventually convinced to come into the emergency department by our cardiology team after he displayed some reluctance due to loss of insurance.  On arrival to the ED, was hypotensive systolic blood pressures in the mid 70s to 80s and a heart rate in the mid 50s, satting well on room air. Initial EKG showed sinus rhythm without dynamic ST-T changes.   Initial lactate was 1.4.  Ethanol level was less than 10.  BNP was 212.  CBC was within normal limits.  BMP showed an AKI with serum creatinine 1.47 (from 1.10 approximately six months ago).  Initial troponin was 20.  Chest x-ray showed 5 basilar patchy interstitial and airspace opacities.  The ED administered approximately 1.5 L of normal saline.  They also gave naloxone x2 with reported improvement in his mental status and blood pressures. His device was interrogated and showed the the generator was as at EOL.  At the time of my  interview in the ED, the patient continued to be altered.  He asked "why does my face feel like this?"  When I inquired about what drugs he may have taken, he stated that he had "delta 10 from [place]" and that he had gotten it from someone he knew, but declined to give further details. The hospitalist team was consulted and we gave another dose of narcan with improvement in mental status and blood pressures to the mid 22W systolic.    Past Medical History:  Diagnosis Date   Anxiety    Automatic implantable cardiac defibrillator St Judes    Analyze ST   Benign prostatic hypertrophy    Benzodiazepine dependence (HCC)    chronic   CAD (coronary artery disease)    Last cath 2/12. 3-v CAD. Failed PCI of distal RCAc  CATH [facility] 4/13 with DES to  LAD X2   CHF (congestive heart failure) (HCC)    Chronic back pain    lumbar stenosis   Chronic systolic heart failure (HCC)    EF 20-25%. s/p ST. Jude ICD   Depression    DJD (degenerative joint disease)    History of testicular cancer    Ischemic cardiomyopathy    Myocardial infarction [facility])    Narcotic dependence ([place])    chronic   OSA on CPAP    Polycythemia, secondary    S/p hematology consultation for polycythema on 01/06/18.  S/p bone marrow biopsy on 12/31/17 that was negative for myeloproliferative disorder.  Feels secondary to sleep  apnea, active smoking, testosterone supplementation, nocturnal hypoxia   TIA (transient ischemic attack)    Vitamin B12 deficiency 07/22/2016    Past Surgical History:  Procedure Laterality Date   ASD REPAIR, SINUS VENOSUS     CARDIAC DEFIBRILLATOR PLACEMENT  08/2010   HERNIA REPAIR     LEFT HEART CATHETERIZATION WITH CORONARY ANGIOGRAM N/A 06/14/2014   Procedure: LEFT HEART CATHETERIZATION WITH CORONARY ANGIOGRAM;  Surgeon: Jolaine Artist, MD;  Location: [facility] CATH LAB;  Service: Cardiovascular;  Laterality: N/A;   TESTICLE SURGERY     testicular cancer surgery left testicle removed     Medications  Prior to Admission: Prior to Admission medications   Medication Sig Start Date End Date Taking? Authorizing Provider  albuterol (VENTOLIN HFA) 108 (90 Base) MCG/ACT inhaler Inhale 2 puffs into the lungs every 6 (six) hours as needed for wheezing or shortness of breath. 10/01/20   [name], NP  ALPRAZolam Duanne Moron) 1 MG tablet Take 1 mg by mouth 4 (four) times daily.     [provider]  carvedilol (COREG) 25 MG tablet Take 25 mg by mouth 2 (two) times daily with a meal.    [provider]  clopidogrel (PLAVIX) 75 MG tablet TAKE ONE TABLET BY MOUTH DAILY 12/23/20   [name], NP  cyanocobalamin (,VITAMIN B-12,) 1000 MCG/ML injection Inject 1 mL (1,000 mcg total) into the muscle every 30 (thirty) days. 10/03/20   [name], NP  dapagliflozin propanediol (FARXIGA) 10 MG TABS tablet Take 1 tablet (10 mg total) by mouth daily before breakfast. 10/01/20   [name], NP  diazepam (VALIUM) 10 MG tablet Take 20 mg by mouth every 12 (twelve) hours as needed for anxiety.  06/04/17   [provider]  diclofenac (VOLTAREN) 75 MG EC tablet Take 1 tablet (75 mg total) by mouth 2 (two) times daily. 01/25/20   Posey Boyer, MD  doxycycline (VIBRAMYCIN) 100 MG capsule Take 1 capsule (100 mg total) by mouth 2 (two) times daily. 01/25/20   Posey Boyer, MD  erythromycin ophthalmic ointment Place 1 application into the right eye at bedtime. 03/03/19   Jacelyn Pi, [name], MD  ezetimibe (ZETIA) 10 MG tablet TAKE 1 TABLET EACH DAY. 10/01/20   [name], NP  furosemide (LASIX) 20 MG tablet Take 1 tablet (20 mg total) by mouth daily. 10/24/20   [name], NP  ibuprofen (ADVIL,MOTRIN) 200 MG tablet Take 800 mg by mouth every 6 (six) hours as needed for moderate pain.    [provider]  lactulose (CHRONULAC) 10 GM/15ML solution Take 15 mLs (10 g total) by mouth 2 (two) times daily as needed for mild constipation. 10/01/20   [name], NP  lisinopril  (PRINIVIL,ZESTRIL) 5 MG tablet Take 5 mg by mouth daily. 07/20/18   [provider]  loperamide (IMODIUM A-D) 2 MG tablet Take 1.5 tablets (3 mg total) by mouth 4 (four) times daily as needed for diarrhea or loose stools. 10/01/20   [name], NP  losartan (COZAAR) 25 MG tablet Take 1 tablet (25 mg total) by mouth daily. 10/01/20   [name], NP  methadone (METHADOSE) 40 MG disintegrating tablet Take 200 mg by mouth daily. [facility]    [provider]  methocarbamol (ROBAXIN) 750 MG tablet TAKE 1 OR 2 TABLETS THREE TIMES A DAY AS NEEDED. 10/01/20   [name], NP  naproxen (NAPROSYN) 500 MG tablet Take 1 tablet (500 mg total) by mouth 2 (two) times daily  as needed for moderate pain. 01/09/20   Jacelyn Pi, [name], MD  nitroGLYCERIN (NITROLINGUAL) 0.4 MG/SPRAY spray Place 2 sprays under the tongue every 5 (five) minutes x 3 doses as needed for chest pain.  03/20/15   [provider]  ondansetron (ZOFRAN-ODT) 8 MG disintegrating tablet Take 1 tablet (8 mg total) by mouth every 8 (eight) hours as needed for nausea or vomiting. 10/01/20   [name], NP  oxymetazoline (AFRIN) 0.05 % nasal spray Place 1 spray into both nostrils 2 (two) times daily.    [provider]  pantoprazole (PROTONIX) 40 MG tablet Take 1 tablet (40 mg total) by mouth daily. 10/01/20   [name], NP  polyethylene glycol powder (GLYCOLAX/MIRALAX) 17 GM/SCOOP powder Take 17 g by mouth 2 (two) times daily as needed for mild constipation.    [provider]  rosuvastatin (CRESTOR) 40 MG tablet Take 1 tablet (40 mg total) by mouth every evening. 10/01/20   [name], NP  sildenafil (REVATIO) 20 MG tablet TAKE 5 TABLETS AS NEEDED. 10/01/20   [name], NP  sildenafil (VIAGRA) 100 MG tablet Take 0.5 tablets (50 mg total) by mouth daily as needed for erectile dysfunction. 09/30/20   [name], NP  spironolactone (ALDACTONE) 25 MG tablet TAKE  (1/2) TABLET DAILY. 10/01/20   [name], NP  sucralfate (CARAFATE) 1 g tablet Take 1 tablet (1 g total) by mouth 4 (four) times daily -  with meals and at bedtime. 03/14/18   [place]om[place], Carley D, DO  sulfamethoxazole-trimethoprim (BACTRIM DS) 800-160 MG tablet Take 1 tablet by mouth 2 (two) times daily. 10/24/20   [name], NP  tamsulosin (FLOMAX) 0.4 MG CAPS capsule Take 1 capsule (0.4 mg total) by mouth daily after breakfast. 10/03/20   [name], NP  tamsulosin [facility]) 0.4 MG CAPS capsule Take 1 capsule (0.4 mg total) by mouth daily after breakfast. 10/01/20   [name], NP  TESTOSTERONE CYPIONATE IM Inject into the muscle every 14 (fourteen) days.    [provider]  torsemide 40 MG TABS Take 20 mg by mouth daily as needed. For swelling in legs 10/01/20   [name], NP  traZODone (DESYREL) 100 MG tablet Take 1 tablet (100 mg total) by mouth at bedtime. 10/01/20   [name], NP  triamcinolone (NASACORT AQ) 55 MCG/ACT AERO nasal inhaler Place 1 spray into the nose daily. 10/01/20   [name], NP  Vitamin D, Ergocalciferol, (DRISDOL) 1.25 MG (50000 UNIT) CAPS capsule Take 1 capsule (50,000 Units total) by mouth every 7 (seven) days. 10/01/20   [name], NP  zolpidem (AMBIEN) 10 MG tablet Take 10 mg by mouth at bedtime.  07/11/18   [provider]     Allergies:    Allergies  Allergen Reactions   Darvocet [Propoxyphene N-Acetaminophen] Anaphylaxis    Throat closes    Social History:   Social History   Socioeconomic History   Marital status: Divorced    Spouse name: Not on file   Number of children: 4   Years of education: Not on file   Highest education level: Not on file  Occupational History   Not on file  Tobacco Use   Smoking status: Every Day    Packs/day: 0.50    Years: 29.00    Pack years: 14.50    Types: Cigarettes, E-cigarettes    Last attempt to quit: 07/14/2015    Years since quitting: 5.5   Smokeless  tobacco: Never   Tobacco comments:  no cigarette in 4 days but yes to e-cig  Vaping Use   Vaping Use: Every day   Start date: 07/14/2015  Substance and Sexual Activity   Alcohol use: No    Alcohol/week: 0.0 standard drinks   Drug use: Yes    Types: Marijuana    Comment: Urine showed THC   Sexual activity: Yes    Partners: Male  Other Topics Concern   Not on file  Social History Narrative   Marital status: married x 12 years; wife is severe alcoholic.      Children: 3 married daughters (23, 46, 47); 1 son (33yo); 3 grandchildren born in 2017      Lives: with wife, son      Left-handed   Caffeine: 3 drinks per day   Social Determinants of Health   Financial Resource Strain: Not on file  Food Insecurity: Not on file  Transportation Needs: Not on file  Physical Activity: Not on file  Stress: Not on file  Social Connections: Not on file  Intimate Partner Violence: Not on file    Family History:   The patient's family history includes Cancer in his mother; Heart disease (age of onset: 55) in his father.    ROS:  Please see the history of present illness.    Physical Exam/Data:   Vitals:   02/14/21 0112 02/14/21 0113 02/14/21 0120 02/14/21 0130  BP: (!) 76/48  (!) 80/49 (!) 82/53  Pulse: (!) 55 (!) 55 (!) 56 (!) 57  Resp: (!) 9 (!) 9 10 11   SpO2: 96% 96% 96% 96%  Weight:      Height:        Intake/Output Summary (Last 24 hours) at 02/14/2021 0205 Last data filed at 02/14/2021 0123 Gross per 24 hour  Intake 2000.08 ml  Output --  Net 2000.08 ml   Last 3 Weights 02/13/2021 09/25/2020 09/10/2020  Weight (lbs) 174 lb 2.6 oz 174 lb 3.2 oz 171 lb  Weight (kg) 79 kg 79.017 kg 77.565 kg     Body mass index is 24.29 kg/m.  General:  Well nourished, well developed, in no acute distress HEENT: normal Lymph: no adenopathy Neck: no JVD Cardiac:  normal S1, S2; RRR; no murmur/rubs/gallops appreciated Lungs:  clear to auscultation bilaterally, no wheezing, rhonchi or rales   Abd: soft, nontender, no hepatomegaly  Ext: trace edema Musculoskeletal:  No deformities, BUE and BLE strength normal and equal Skin: warm and dry  Neuro:  Not assess given altered mentation Psych:  Normal affect    EKG:  The ECG that was done and was personally reviewed.   Relevant CV Studies: TTE (07/2019) 1. Left ventricular ejection fraction, by visual estimation, is 40 to  45%. The left ventricle has mildly decreased function. There is mildly  increased left ventricular hypertrophy.   2. Severe akinesis of the left ventricular, entire inferior wall.   3. Left ventricular diastolic parameters are consistent with Grade I  diastolic dysfunction (impaired relaxation).   4. Moderately dilated left ventricular internal cavity size.   5. The left ventricle demonstrates regional wall motion abnormalities.   6. Global right ventricle has low normal systolic function.The right  ventricular size is normal. No increase in right ventricular wall  thickness.   7. Left atrial size was moderately dilated.   8. Right atrial size was normal.   9. The mitral valve is grossly normal. Mild mitral valve regurgitation.  10. The tricuspid valve is grossly normal.  11. The aortic valve  is grossly normal. Aortic valve regurgitation is not  visualized.  12. The pulmonic valve was grossly normal. Pulmonic valve regurgitation is  not visualized.  13. A AICD wire is visualized.  14. A prior study was performed on 03/12/2018.  15. Changes from prior study are noted.  16. Prior echo: LVEF 30-35%.   Laboratory Data:  High Sensitivity Troponin:   Recent Labs  Lab 02/13/21 2106  TROPONINIHS 20*      Chemistry Recent Labs  Lab 02/13/21 2106  NA 136  K 3.7  CL 101  CO2 26  GLUCOSE 145*  BUN 23  CREATININE 1.47*  CALCIUM 9.1  GFRNONAA 54*  ANIONGAP 9    No results for input(s): PROT, ALBUMIN, AST, ALT, ALKPHOS, BILITOT in the last 168 hours. Hematology Recent Labs  Lab 02/13/21 2106   WBC 5.4  RBC 4.41  HGB 14.7  HCT 44.4  MCV 100.7*  MCH 33.3  MCHC 33.1  RDW 13.0  PLT 144*   BNP Recent Labs  Lab 02/13/21 2108  BNP 212.1*    DDimer No results for input(s): DDIMER in the last 168 hours.   Radiology/Studies:  DG Chest Port 1 View  Result Date: 02/13/2021 CLINICAL DATA:  defibrillator has gone off 5 times today EXAM: PORTABLE CHEST 1 VIEW COMPARISON:  CT chest 03/11/2018, x-ray chest 01/16/2019 ribs, chest x-ray 01/16/2019 FINDINGS: The heart size and mediastinal contours are within normal limits. Left chest wall dual cardiac pacemaker/defibrillator with leads in similar position. Bibasilar patchy airspace opacities. Coarsened interstitial markings. No pleural effusion. No pneumothorax. No acute osseous abnormality. IMPRESSION: Bibasilar patchy interstitial and airspace opacities that may represent pulmonary edema versus infection/inflammation. Electronically Signed   By: Iven Finn M.D.   On: 02/13/2021 21:55     Assessment and Plan:   ALFONS SULKOWSKI is a 59 y.o. male with severe coronary artery disease, ischemic cardiomyopathy/heart failure (EF 40-45% on TTE in 07/2019) s/p St. 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  # ST Jude ICD in situ # Device at EOL - NPO at midnight for possible gen change in AM - Will discuss with EP in AM   # Altered Mental Status # Hypotension :: Normal lactates x2 and improvement with narcan. Suspect may be 2/2 to his chronic methadone, AKI, and reported weight loss. Appreciate Hospitalist team's early assistance. Given that he will need continued narcan administration via infusion, we will need to admit him to an ICU. - Consult Intensivist team for assistance with narcan drip - Continue to monitor hemodynamics closely - Hold  antihypertensive agents and GDMT for now   # Chronic Medical Issues - Unable to complete medication regimen with patient given his mental status. Will need to discuss meds with him when he is more alert or call his pharmacy to get a recent fill history.    Risk Assessment/Risk Scores:           Severity of Illness: The appropriate patient status for this patient is OBSERVATION. Observation status is judged to be reasonable and necessary in order to provide the required intensity of service to ensure the patient's safety. The patient's presenting symptoms, physical exam findings, and initial radiographic and laboratory data in the context of their medical condition is felt to place them at decreased risk for further  clinical deterioration. Furthermore, it is anticipated that the patient will be medically stable for discharge from the hospital within 2 midnights of admission. The following factors support the patient status of observation.   " The patient's presenting symptoms include altered mental status, device at ERI. " The physical exam findings include altered mentation/sensorium. " The initial radiographic and laboratory data are none.   For questions or updates, please contact [place] Please consult [email or web address] for contact info under     Signed, Clois Dupes, MD  02/14/2021 2:05 AM    Attending addendum:  History and all data above reviewed.  Patient examined.  I agree with the findings as above.  All available labs, radiology testing, previous records reviewed. Agree with documented assessment and plan.  Mr. Bonneau is a 60 year old man with chronic systolic diastolic heart failure, status post [place] ICD, narcotic dependence on methadone, polycythemia, and depression admitted with encephalopathy.  Cardiology was consulted for evaluation of ICD alarms.  His device was found to be at end-of-life.  He reports it was shaking his car but there are no indications that his  ICD actually fired.  He was altered on admission and hypotensive to the 70s with heart rates in the 50s.  He denied chest pain.  His symptoms improved with naloxone and he was started on a Narcan drip.  This morning he requested that it be discontinued.  He has a myriad of complaints, none of which are cardiac.  He appears to be euvolemic on exam.  Appreciate critical care regarding his Narcan drip and pain management.  We will ask EP to see him for his EOL ICD.  Last LVEF was 40 to 45% last year.  We will repeat an echocardiogram.  Given that he was hypotensive on presentation his home carvedilol is on hold.  He has both lisinopril and losartan in his outpatient medication list.  He had mild AKI on admission.  It is slightly improved today to 1.25.  Continue to hold ACE inhibitor/ARB.  We will resume carvedilol at 3.125 mg and Farxiga 10 mg.  Restart losartan tomorrow if renal function stable  Maddilynn Esperanza C. Oval Linsey, MD, [facility]  02/14/2021 9:10 AM

## 2021-02-14 NOTE — ED Notes (Signed)
Cards MD and admitting MD at bedside

## 2021-02-14 NOTE — Progress Notes (Signed)
Asked to evaluate patient's device interrogation 2/2 battery alert. Transmission dated 02/13/21 is reviewed Battery reached ERI (elective replacement indicator) yesterday 02/13/21. A paces <1% V paces <1% Lead impedances and sensing are stable He has not had any arrhythmias  the transmission from yesterday will have discble the patient alert soe he does not keep getting the vibratory alert.  He has 3 mo window to plan generator change  I have made EP follow up and made the patient and his wife at bedside aware.  Tommye Standard, PA-C

## 2021-02-14 NOTE — ED Notes (Signed)
hospitalist at the bedside and order for Narcan gotten.

## 2021-02-14 NOTE — Plan of Care (Signed)

## 2021-02-14 NOTE — ED Notes (Signed)
Admitting provider at the bedside.

## 2021-02-14 NOTE — Progress Notes (Signed)
Initial Nutrition Assessment  DOCUMENTATION CODES:   Not applicable  INTERVENTION:   Ensure Enlive po BID, each supplement provides 350 kcal and 20 grams of protein MVI with minerals daily   NUTRITION DIAGNOSIS:   Increased nutrient needs related to acute illness as evidenced by estimated needs.  GOAL:   Patient will meet greater than or equal to 90% of their needs  MONITOR:   PO intake, Supplement acceptance, Weight trends, Labs, I & O's  REASON FOR ASSESSMENT:   Malnutrition Screening Tool    ASSESSMENT:   Patient with PMH significant for chronic pain, polycythemia vera, testicular cancer, CHF with ICM s/p AICD, pulmonary MAI, and CAD. Presents this admission with hypotension/bradycardia due to methadone overdose and polypharmacy.  Off narcan. Acute encephalopathy resolved.   Patient upset about being NPO this am. Seen in room drinking what looks to be water from a urinal. Diet advanced by provider just now.   Patient denies loss in appetite PTA. States he consumes three meals daily with good protein sources. Eager to start eating this admission. Endorses weight loss of 100 lb in the last two years from his UBW of 260 lb given his history of polycythemia vera. Records indicate patient weighed around 200 lb in 2020. Was 163 lb once year ago and 174 lb this admission, showing a steady weight gain over the last year.   Of note patient. Has severe muscle depletion in bilateral lower extremities. Suspect this could be related to immobility as patient reports being unable to walk.   Medications: reviewed  Labs: Cr 1.25- trending down  NUTRITION - FOCUSED PHYSICAL EXAM:  Flowsheet Row Most Recent Value  Orbital Region No depletion  Upper Arm Region Mild depletion  Buccal Region No depletion  Temple Region Mild depletion  Clavicle Bone Region No depletion  Clavicle and Acromion Bone Region No depletion  Dorsal Hand No depletion  Patellar Region Severe depletion   Anterior Thigh Region Severe depletion  Posterior Calf Region Severe depletion  Edema (RD Assessment) Mild  Hair Reviewed  Eyes Reviewed  Mouth Reviewed  Skin Reviewed  Nails Reviewed      Diet Order:   Diet Order             Diet Heart Room service appropriate? Yes; Fluid consistency: Thin  Diet effective now                   EDUCATION NEEDS:   Education needs have been addressed  Skin:  Skin Assessment: Reviewed RN Assessment  Last BM:  8/5  Height:   Ht Readings from Last 1 Encounters:  02/14/21 5' 11"  (1.803 m)    Weight:   Wt Readings from Last 1 Encounters:  02/14/21 84 kg    BMI:  Body mass index is 25.83 kg/m.  Estimated Nutritional Needs:   Kcal:  2400-2600 kcal  Protein:  120-135 grams  Fluid:  >/= 2 L/day   Mariana Single MS, RD, LDN, CNSC Clinical Nutrition Pager listed in [place]

## 2021-02-14 NOTE — Progress Notes (Signed)
Patient left accompanied by wife. PIV x2 removed.

## 2021-02-14 NOTE — ED Notes (Signed)
Cardiology notified multiple times about low BP, order to draw another Lactic gotten, and lab collected, per Cardiology pt will be continuously monitor for now.

## 2021-02-14 NOTE — Progress Notes (Signed)
Patient insisting to turn off narcan drip, saying to turn off drip or he will pull out PIV; Narcan gtt turned off. Primary MD and CCM notified

## 2021-02-14 NOTE — Progress Notes (Addendum)
Patient took methadone from his own supply without permission. Patient refusing to surrender methadone to pharmacy and threatens to leave AMA if not allowed to take his methadone daily dose. Attending MD notified

## 2021-02-14 NOTE — Progress Notes (Addendum)
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 Upon arrival he has been hypotensive, bradycardic, and confused. His hemodynamics and responsiveness have improved with narcan x 3 doses. PCCM was consulted for assistance with managing his narcan infusion in the ICU. He will not say where his methadone clinic is, but he reports only taking it 3 days per week. He does not think his current symptoms are related to his methadone. His psychiatrist has prescribed him xanax, valium, and Ambien. He has powdered fentanyl, but has not taken it recently. He denies other opiate use recently. He has not taken his lisinopril recently due to low BPs but has remained on his coreg.  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 Dc'd late in morning. Hemodynamically stable   Interim History / Subjective:  No distress.   Objective   Blood pressure 116/72, pulse 63, temperature 97.8 F (36.6 C), temperature source Oral, resp. rate 20, height 5' 11"  (1.803 m), weight 84 kg, SpO2 96 %.        Intake/Output Summary (Last 24 hours) at 02/14/2021 1029 Last data filed at 02/14/2021 0600 Gross per 24 hour  Intake 2077.46 ml  Output no documentation  Net 2077.46 ml   Filed Weights   02/13/21 2321 02/14/21 0600  Weight: 79 kg 84 kg     Examination:  General this is a 60 year old male. He is resting in bed.  HENT NCAT no JVD Pulm scattered rhonchi  Card rrr Abd soft not tender Ext warm and dry  Neuro intact   Resolved Hospital Problem list   Acute encephalopathy resolved.   Assessment & Plan:  Hypotension,&  bradycardia w/ acute toxic encephalopathy due to methadone overdose and polypharmacy--- UDS positive for benzos and THC plus he takes chronic methadone. Has responded to narcan -narcan turned off earlier this am and remains hemodynamically stable. Plan Dc'd narcan from meds Slowly re-introduce home meds in next 24 hrs.  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 Reports intermittent use. -unclear what PTA setting was Plan CPAP at HS  Prolonged Qtc, probably due to methadone Plan Monitor   Best Practice (right click and "Reselect all SmartList Selections" daily)   Per primary Note: patient wants to discuss goals of care w/ primary team.  We will s/o   [name] ACNP-BC [place] Pager # [phone] OR # [phone] if no answer   PCCM:  60 yo M, admitted for hypotension, acute toxic encephalopathy, polypharmacy and methadone od   BP 118/71 (BP Location: Left Arm)   Pulse (!) 54   Temp 98.1 F (36.7 C) (Oral)   Resp 20   Ht 5'  11" (1.803 m)   Wt 84 kg   SpO2 94%   BMI 25.83 kg/m   Gen: chronically ill appearing male  HENT: NCAT  Heart: RRR, s1 s2 Lung: CTAB   Labs reviewed   A:  Hypotension -resolved  Methadone use  Polypharmacy  HFrEF  OSA   P: D/c narcan  CPAP QHS  Stable from a pulmonary standpoint  PCCM will sign off   Trevor Nash, DO [facility] 02/14/2021 2:46 PM

## 2021-02-19 LAB — CULTURE, BLOOD (ROUTINE X 2)
Culture: NO GROWTH
Culture: NO GROWTH
Special Requests: ADEQUATE

## 2021-02-20 ENCOUNTER — Ambulatory Visit (INDEPENDENT_AMBULATORY_CARE_PROVIDER_SITE_OTHER): Payer: Self-pay | Admitting: Registered Nurse

## 2021-02-20 ENCOUNTER — Encounter: Payer: Self-pay | Admitting: Registered Nurse

## 2021-02-20 ENCOUNTER — Other Ambulatory Visit: Payer: Self-pay

## 2021-02-20 VITALS — HR 66 | Temp 98.3°F | Resp 16 | Ht 71.0 in

## 2021-02-20 DIAGNOSIS — J9621 Acute and chronic respiratory failure with hypoxia: Secondary | ICD-10-CM

## 2021-02-20 DIAGNOSIS — J9611 Chronic respiratory failure with hypoxia: Secondary | ICD-10-CM

## 2021-02-20 NOTE — Patient Instructions (Signed)
° ° ° °  If you have lab work done today you will be contacted with your lab results within the next 2 weeks.  If you have not heard from us then please contact us. The fastest way to get your results is to register for My Chart. ° ° °IF you received an x-ray today, you will receive an invoice from [facility]. Please contact [facility] at [phone] with questions or concerns regarding your invoice.  ° °IF you received labwork today, you will receive an invoice from [facility]. Please contact [facility] at [phone] with questions or concerns regarding your invoice.  ° °Our billing staff will not be able to assist you with questions regarding bills from these companies. ° °You will be contacted with the lab results as soon as they are available. The fastest way to get your results is to activate your My Chart account. Instructions are located on the last page of this paperwork. If you have not heard from us regarding the results in 2 weeks, please contact this office. °  ° ° ° °

## 2021-02-25 NOTE — Progress Notes (Deleted)
Electrophysiology Office Note Date: 02/25/2021  ID:  [name], DOB 08/27/1960, MRN [id number]  PCP: [name], NP Primary Cardiologist: None Electrophysiologist: Virl Axe, MD   CC: Routine ICD follow-up  [name] is a 60 y.o. male seen today for Virl Axe, MD for routine electrophysiology followup.  Since last being seen in our clinic the patient reports doing ***.  he denies chest pain, palpitations, dyspnea, PND, orthopnea, nausea, vomiting, dizziness, syncope, edema, weight gain, or early satiety. He has not had ICD shocks.   Device History: St. Jude Dual Chamber ICD implanted 08/13/2010 for CHF   Past Medical History:  Diagnosis Date   Anxiety    Automatic implantable cardiac defibrillator St Judes    Analyze ST   Benign prostatic hypertrophy    Benzodiazepine dependence (HCC)    chronic   CAD (coronary artery disease)    Last cath 2/12. 3-v CAD. Failed PCI of distal RCAc  CATH [facility] 4/13 with DES to  LAD X2   CHF (congestive heart failure) (HCC)    Chronic back pain    lumbar stenosis   Chronic systolic heart failure (HCC)    EF 20-25%. s/p ST. Jude ICD   Depression    DJD (degenerative joint disease)    History of testicular cancer    Ischemic cardiomyopathy    Myocardial infarction [facility])    Narcotic dependence ([place])    chronic   OSA on CPAP    Polycythemia, secondary    S/p hematology consultation for polycythema on 01/06/18.  S/p bone marrow biopsy on 12/31/17 that was negative for myeloproliferative disorder.  Feels secondary to sleep apnea, active smoking, testosterone supplementation, nocturnal hypoxia   TIA (transient ischemic attack)    Vitamin B12 deficiency 07/22/2016   Past Surgical History:  Procedure Laterality Date   ASD REPAIR, SINUS VENOSUS     CARDIAC DEFIBRILLATOR PLACEMENT  08/2010   HERNIA REPAIR     LEFT HEART CATHETERIZATION WITH CORONARY ANGIOGRAM N/A 06/14/2014   Procedure: LEFT HEART CATHETERIZATION WITH CORONARY  ANGIOGRAM;  Surgeon: Jolaine Artist, MD;  Location: [facility] CATH LAB;  Service: Cardiovascular;  Laterality: N/A;   TESTICLE SURGERY     testicular cancer surgery left testicle removed    Current Outpatient Medications  Medication Sig Dispense Refill   albuterol (VENTOLIN HFA) 108 (90 Base) MCG/ACT inhaler Inhale 2 puffs into the lungs every 6 (six) hours as needed for wheezing or shortness of breath. 18 g 5   ALPRAZolam (XANAX) 1 MG tablet Take 1 mg by mouth 4 (four) times daily.      carvedilol (COREG) 25 MG tablet Take 25 mg by mouth 2 (two) times daily with a meal.     clopidogrel (PLAVIX) 75 MG tablet TAKE ONE TABLET BY MOUTH DAILY 90 tablet 0   cyanocobalamin (,VITAMIN B-12,) 1000 MCG/ML injection Inject 1 mL (1,000 mcg total) into the muscle every 30 (thirty) days. 1 mL 11   dapagliflozin propanediol (FARXIGA) 10 MG TABS tablet Take 1 tablet (10 mg total) by mouth daily before breakfast. 30 tablet 6   diazepam (VALIUM) 10 MG tablet Take 20 mg by mouth every 12 (twelve) hours as needed for anxiety.   2   diclofenac (VOLTAREN) 75 MG EC tablet Take 1 tablet (75 mg total) by mouth 2 (two) times daily. 20 tablet 0   doxycycline (VIBRAMYCIN) 100 MG capsule Take 1 capsule (100 mg total) by mouth 2 (two) times daily. 14 capsule 0  erythromycin ophthalmic ointment Place 1 application into the right eye at bedtime. 3.5 g 1   ezetimibe (ZETIA) 10 MG tablet TAKE 1 TABLET EACH DAY. 90 tablet 0   furosemide (LASIX) 20 MG tablet Take 1 tablet (20 mg total) by mouth daily. 30 tablet 3   ibuprofen (ADVIL,MOTRIN) 200 MG tablet Take 800 mg by mouth every 6 (six) hours as needed for moderate pain.     lactulose (CHRONULAC) 10 GM/15ML solution Take 15 mLs (10 g total) by mouth 2 (two) times daily as needed for mild constipation. 1892 mL 0   lisinopril (PRINIVIL,ZESTRIL) 5 MG tablet Take 5 mg by mouth daily.     loperamide (IMODIUM A-D) 2 MG tablet Take 1.5 tablets (3 mg total) by mouth 4 (four) times daily  as needed for diarrhea or loose stools. 30 tablet 2   losartan (COZAAR) 25 MG tablet Take 1 tablet (25 mg total) by mouth daily. 90 tablet 3   methadone (METHADOSE) 40 MG disintegrating tablet Take 200 mg by mouth daily. [facility]     methocarbamol (ROBAXIN) 750 MG tablet TAKE 1 OR 2 TABLETS THREE TIMES A DAY AS NEEDED. 180 tablet 5   naproxen (NAPROSYN) 500 MG tablet Take 1 tablet (500 mg total) by mouth 2 (two) times daily as needed for moderate pain. 60 tablet 2   nitroGLYCERIN (NITROLINGUAL) 0.4 MG/SPRAY spray Place 2 sprays under the tongue every 5 (five) minutes x 3 doses as needed for chest pain.      ondansetron (ZOFRAN-ODT) 8 MG disintegrating tablet Take 1 tablet (8 mg total) by mouth every 8 (eight) hours as needed for nausea or vomiting. 90 tablet 1   oxymetazoline (AFRIN) 0.05 % nasal spray Place 1 spray into both nostrils 2 (two) times daily.     pantoprazole (PROTONIX) 40 MG tablet Take 1 tablet (40 mg total) by mouth daily. 90 tablet 1   polyethylene glycol powder (GLYCOLAX/MIRALAX) 17 GM/SCOOP powder Take 17 g by mouth 2 (two) times daily as needed for mild constipation.     rosuvastatin (CRESTOR) 40 MG tablet Take 1 tablet (40 mg total) by mouth every evening. 30 tablet 2   sildenafil (REVATIO) 20 MG tablet TAKE 5 TABLETS AS NEEDED. 90 tablet 0   sildenafil (VIAGRA) 100 MG tablet Take 0.5 tablets (50 mg total) by mouth daily as needed for erectile dysfunction. 30 tablet 0   spironolactone (ALDACTONE) 25 MG tablet TAKE (1/2) TABLET DAILY. 45 tablet 1   sucralfate (CARAFATE) 1 g tablet Take 1 tablet (1 g total) by mouth 4 (four) times daily -  with meals and at bedtime. 120 tablet 0   sulfamethoxazole-trimethoprim (BACTRIM DS) 800-160 MG tablet Take 1 tablet by mouth 2 (two) times daily. 14 tablet 0   tamsulosin (FLOMAX) 0.4 MG CAPS capsule Take 1 capsule (0.4 mg total) by mouth daily after breakfast. 90 capsule 0   tamsulosin (FLOMAX) 0.4 MG CAPS capsule Take 1  capsule (0.4 mg total) by mouth daily after breakfast. 90 capsule 0   TESTOSTERONE CYPIONATE IM Inject into the muscle every 14 (fourteen) days.     torsemide 40 MG TABS Take 20 mg by mouth daily as needed. For swelling in legs 90 tablet 3   traZODone (DESYREL) 100 MG tablet Take 1 tablet (100 mg total) by mouth at bedtime. 90 tablet 0   triamcinolone (NASACORT AQ) 55 MCG/ACT AERO nasal inhaler Place 1 spray into the nose daily. 1 each 12   Vitamin D,  Ergocalciferol, (DRISDOL) 1.25 MG (50000 UNIT) CAPS capsule Take 1 capsule (50,000 Units total) by mouth every 7 (seven) days. 12 capsule 1   zolpidem (AMBIEN) 10 MG tablet Take 10 mg by mouth at bedtime.      No current facility-administered medications for this visit.    Allergies:   Darvocet [propoxyphene n-acetaminophen]   Social History: Social History   Socioeconomic History   Marital status: Divorced    Spouse name: Not on file   Number of children: 4   Years of education: Not on file   Highest education level: Not on file  Occupational History   Not on file  Tobacco Use   Smoking status: Every Day    Packs/day: 0.50    Years: 29.00    Pack years: 14.50    Types: Cigarettes, E-cigarettes    Last attempt to quit: 07/14/2015    Years since quitting: 5.6   Smokeless tobacco: Never   Tobacco comments:    no cigarette in 4 days but yes to e-cig  Vaping Use   Vaping Use: Every day   Start date: 07/14/2015  Substance and Sexual Activity   Alcohol use: No    Alcohol/week: 0.0 standard drinks   Drug use: Yes    Types: Marijuana    Comment: Urine showed THC   Sexual activity: Yes    Partners: Male  Other Topics Concern   Not on file  Social History Narrative   Marital status: married x 12 years; wife is severe alcoholic.      Children: 3 married daughters (23, 47, 64); 1 son (18yo); 3 grandchildren born in 2017      Lives: with wife, son      Left-handed   Caffeine: 3 drinks per day   Social Determinants of Health    Financial Resource Strain: Not on file  Food Insecurity: Not on file  Transportation Needs: Not on file  Physical Activity: Not on file  Stress: Not on file  Social Connections: Not on file  Intimate Partner Violence: Not on file    Family History: Family History  Problem Relation Age of Onset   Heart disease Father 57       Died of MI   Cancer Mother     Review of Systems: All other systems reviewed and are otherwise negative except as noted above.   Physical Exam: There were no vitals filed for this visit.   GEN- The patient is well appearing, alert and oriented x 3 today.   HEENT: normocephalic, atraumatic; sclera clear, conjunctiva pink; hearing intact; oropharynx clear; neck supple, no JVP Lymph- no cervical lymphadenopathy Lungs- Clear to ausculation bilaterally, normal work of breathing.  No wheezes, rales, rhonchi Heart- Regular rate and rhythm, no murmurs, rubs or gallops, PMI not laterally displaced GI- soft, non-tender, non-distended, bowel sounds present, no hepatosplenomegaly Extremities- no clubbing or cyanosis. No edema; DP/PT/radial pulses 2+ bilaterally MS- no significant deformity or atrophy Skin- warm and dry, no rash or lesion; ICD pocket well healed Psych- euthymic mood, full affect Neuro- strength and sensation are intact  ICD interrogation- reviewed in detail today,  See PACEART report  EKG:  EKG is not ordered today. Personal review of EKG ordered {Blank single:19197::"today","***"} shows  Recent Labs: 08/23/2020: ALT 7; TSH 0.554 02/13/2021: B Natriuretic Peptide 212.1 02/14/2021: BUN 22; Creatinine, Ser 1.25; Hemoglobin 12.7; Magnesium 1.8; Platelets 104; Potassium 4.3; Sodium 138   Wt Readings from Last 3 Encounters:  02/14/21 185 lb 3 oz (84  kg)  09/25/20 174 lb 3.2 oz (79 kg)  09/10/20 171 lb (77.6 kg)     Other studies Reviewed: Additional studies/ records that were reviewed today include: ***   Assessment and Plan:  1.  Chronic  systolic dysfunction s/p St. Jude dual chamber ICD  euvolemic today Stable on an appropriate medical regimen Normal ICD function for device at ERI as of 02/13/2021. Need to update Echo prior to gen change for ICD.  See Claudia Desanctis Art report No changes today   Current medicines are reviewed at length with the patient today.   The patient {ACTIONS; HAS/DOES NOT HAVE:19233} concerns regarding his medicines.  The following changes were made today:  {NONE DEFAULTED:18576}  Labs/ tests ordered today include: *** No orders of the defined types were placed in this encounter.    Disposition:   Follow up with {Blank single:19197::"Dr. Allred","Dr. Arlan Organ. Klein","Dr. Camnitz","Dr. Lambert","EP APP"}  {gen number [id number]} {TIME; UNITS DAY/WEEK/MONTH:19136}   Signed, Shirley Friar, PA-C  02/25/2021 10:41 AM  [facility] [address] [place] [address] [phone] (office) [phone] (fax)

## 2021-02-26 ENCOUNTER — Encounter: Payer: Self-pay | Admitting: Student

## 2021-02-26 DIAGNOSIS — I5022 Chronic systolic (congestive) heart failure: Secondary | ICD-10-CM

## 2021-02-26 DIAGNOSIS — I255 Ischemic cardiomyopathy: Secondary | ICD-10-CM

## 2021-02-28 NOTE — Progress Notes (Signed)
Remote ICD transmission.   

## 2021-02-28 NOTE — Addendum Note (Signed)
Addended by: Cheri Kearns A on: 02/28/2021 10:05 AM   Modules accepted: Level of Service

## 2021-03-06 ENCOUNTER — Ambulatory Visit (INDEPENDENT_AMBULATORY_CARE_PROVIDER_SITE_OTHER): Payer: BC Managed Care – PPO

## 2021-03-06 DIAGNOSIS — I255 Ischemic cardiomyopathy: Secondary | ICD-10-CM

## 2021-03-06 NOTE — Progress Notes (Signed)
Electrophysiology Office Note Date: 03/07/2021  ID:  [name], DOB Jan 23, 1961, MRN [id number]  PCP: [name], NP Primary Cardiologist: None Electrophysiologist: Virl Axe, MD   CC: Routine ICD follow-up  [name] is a 60 y.o. male seen today for Virl Axe, MD for post hospital follow up.   Admitted 8/4 with "ICD going off". This due to vibratory alerts for ERI. No therapies. Was noted to be hypotensive and confused, felt secondary to methadone. Improved with narcan.    Since discharge from hospital the patient reports doing well. Remains deconditioned at baseline. No current chest pain, palpitations, PND, orthopnea, nausea, vomiting, dizziness, syncope, edema, weight gain, or early satiety. He has not had ICD shocks.   Device History: StMudlogger ICD implanted 2012 for CHF  Past Medical History:  Diagnosis Date   Anxiety    Automatic implantable cardiac defibrillator St Judes    Analyze ST   Benign prostatic hypertrophy    Benzodiazepine dependence (HCC)    chronic   CAD (coronary artery disease)    Last cath 2/12. 3-v CAD. Failed PCI of distal RCAc  CATH [facility] 4/13 with DES to  LAD X2   CHF (congestive heart failure) (HCC)    Chronic back pain    lumbar stenosis   Chronic systolic heart failure (HCC)    EF 20-25%. s/p ST. Jude ICD   Depression    DJD (degenerative joint disease)    History of testicular cancer    Ischemic cardiomyopathy    Myocardial infarction [facility])    Narcotic dependence ([place])    chronic   OSA on CPAP    Polycythemia, secondary    S/p hematology consultation for polycythema on 01/06/18.  S/p bone marrow biopsy on 12/31/17 that was negative for myeloproliferative disorder.  Feels secondary to sleep apnea, active smoking, testosterone supplementation, nocturnal hypoxia   TIA (transient ischemic attack)    Vitamin B12 deficiency 07/22/2016   Past Surgical History:  Procedure Laterality Date   ASD REPAIR, SINUS VENOSUS      CARDIAC DEFIBRILLATOR PLACEMENT  08/2010   HERNIA REPAIR     LEFT HEART CATHETERIZATION WITH CORONARY ANGIOGRAM N/A 06/14/2014   Procedure: LEFT HEART CATHETERIZATION WITH CORONARY ANGIOGRAM;  Surgeon: Jolaine Artist, MD;  Location: [facility] CATH LAB;  Service: Cardiovascular;  Laterality: N/A;   TESTICLE SURGERY     testicular cancer surgery left testicle removed    Current Outpatient Medications  Medication Sig Dispense Refill   albuterol (VENTOLIN HFA) 108 (90 Base) MCG/ACT inhaler Inhale 2 puffs into the lungs every 6 (six) hours as needed for wheezing or shortness of breath. 18 g 5   ALPRAZolam (XANAX) 1 MG tablet Take 1 mg by mouth 4 (four) times daily.      carvedilol (COREG) 25 MG tablet Take 25 mg by mouth 2 (two) times daily with a meal.     clopidogrel (PLAVIX) 75 MG tablet TAKE ONE TABLET BY MOUTH DAILY 90 tablet 0   cyanocobalamin (,VITAMIN B-12,) 1000 MCG/ML injection Inject 1 mL (1,000 mcg total) into the muscle every 30 (thirty) days. 1 mL 11   dapagliflozin propanediol (FARXIGA) 10 MG TABS tablet Take 1 tablet (10 mg total) by mouth daily before breakfast. 30 tablet 6   diazepam (VALIUM) 10 MG tablet Take 20 mg by mouth every 12 (twelve) hours as needed for anxiety.   2   diclofenac (VOLTAREN) 75 MG EC tablet Take 1 tablet (75 mg total) by  mouth 2 (two) times daily. 20 tablet 0   doxycycline (VIBRAMYCIN) 100 MG capsule Take 1 capsule (100 mg total) by mouth 2 (two) times daily. 14 capsule 0   erythromycin ophthalmic ointment Place 1 application into the right eye at bedtime. 3.5 g 1   ezetimibe (ZETIA) 10 MG tablet TAKE 1 TABLET EACH DAY. 90 tablet 0   furosemide (LASIX) 20 MG tablet Take 1 tablet (20 mg total) by mouth daily. 30 tablet 3   ibuprofen (ADVIL,MOTRIN) 200 MG tablet Take 800 mg by mouth every 6 (six) hours as needed for moderate pain.     lactulose (CHRONULAC) 10 GM/15ML solution Take 15 mLs (10 g total) by mouth 2 (two) times daily as needed for mild  constipation. 1892 mL 0   lisinopril (PRINIVIL,ZESTRIL) 5 MG tablet Take 5 mg by mouth daily.     loperamide (IMODIUM A-D) 2 MG tablet Take 1.5 tablets (3 mg total) by mouth 4 (four) times daily as needed for diarrhea or loose stools. 30 tablet 2   losartan (COZAAR) 25 MG tablet Take 1 tablet (25 mg total) by mouth daily. 90 tablet 3   methadone (METHADOSE) 40 MG disintegrating tablet Take 200 mg by mouth daily. [facility]     methocarbamol (ROBAXIN) 750 MG tablet TAKE 1 OR 2 TABLETS THREE TIMES A DAY AS NEEDED. 180 tablet 5   naproxen (NAPROSYN) 500 MG tablet Take 1 tablet (500 mg total) by mouth 2 (two) times daily as needed for moderate pain. 60 tablet 2   nitroGLYCERIN (NITROLINGUAL) 0.4 MG/SPRAY spray Place 2 sprays under the tongue every 5 (five) minutes x 3 doses as needed for chest pain.      ondansetron (ZOFRAN-ODT) 8 MG disintegrating tablet Take 1 tablet (8 mg total) by mouth every 8 (eight) hours as needed for nausea or vomiting. 90 tablet 1   oxymetazoline (AFRIN) 0.05 % nasal spray Place 1 spray into both nostrils 2 (two) times daily.     pantoprazole (PROTONIX) 40 MG tablet Take 1 tablet (40 mg total) by mouth daily. 90 tablet 1   polyethylene glycol powder (GLYCOLAX/MIRALAX) 17 GM/SCOOP powder Take 17 g by mouth 2 (two) times daily as needed for mild constipation.     rosuvastatin (CRESTOR) 40 MG tablet Take 1 tablet (40 mg total) by mouth every evening. 30 tablet 2   sildenafil (REVATIO) 20 MG tablet TAKE 5 TABLETS AS NEEDED. 90 tablet 0   sildenafil (VIAGRA) 100 MG tablet Take 0.5 tablets (50 mg total) by mouth daily as needed for erectile dysfunction. 30 tablet 0   spironolactone (ALDACTONE) 25 MG tablet TAKE (1/2) TABLET DAILY. 45 tablet 1   sucralfate (CARAFATE) 1 g tablet Take 1 tablet (1 g total) by mouth 4 (four) times daily -  with meals and at bedtime. 120 tablet 0   sulfamethoxazole-trimethoprim (BACTRIM DS) 800-160 MG tablet Take 1 tablet by mouth 2  (two) times daily. 14 tablet 0   tamsulosin (FLOMAX) 0.4 MG CAPS capsule Take 1 capsule (0.4 mg total) by mouth daily after breakfast. 90 capsule 0   tamsulosin (FLOMAX) 0.4 MG CAPS capsule Take 1 capsule (0.4 mg total) by mouth daily after breakfast. 90 capsule 0   TESTOSTERONE CYPIONATE IM Inject into the muscle every 14 (fourteen) days.     torsemide 40 MG TABS Take 20 mg by mouth daily as needed. For swelling in legs 90 tablet 3   traZODone (DESYREL) 100 MG tablet Take 1 tablet (100 mg  total) by mouth at bedtime. 90 tablet 0   triamcinolone (NASACORT AQ) 55 MCG/ACT AERO nasal inhaler Place 1 spray into the nose daily. 1 each 12   Vitamin D, Ergocalciferol, (DRISDOL) 1.25 MG (50000 UNIT) CAPS capsule Take 1 capsule (50,000 Units total) by mouth every 7 (seven) days. 12 capsule 1   zolpidem (AMBIEN) 10 MG tablet Take 10 mg by mouth at bedtime.      No current facility-administered medications for this visit.    Allergies:   Darvocet [propoxyphene n-acetaminophen]   Social History: Social History   Socioeconomic History   Marital status: Divorced    Spouse name: Not on file   Number of children: 4   Years of education: Not on file   Highest education level: Not on file  Occupational History   Not on file  Tobacco Use   Smoking status: Every Day    Packs/day: 0.50    Years: 29.00    Pack years: 14.50    Types: Cigarettes, E-cigarettes    Last attempt to quit: 07/14/2015    Years since quitting: 5.6   Smokeless tobacco: Never   Tobacco comments:    no cigarette in 4 days but yes to e-cig  Vaping Use   Vaping Use: Every day   Start date: 07/14/2015  Substance and Sexual Activity   Alcohol use: No    Alcohol/week: 0.0 standard drinks   Drug use: Yes    Types: Marijuana    Comment: Urine showed THC   Sexual activity: Yes    Partners: Male  Other Topics Concern   Not on file  Social History Narrative   Marital status: married x 12 years; wife is severe alcoholic.       Children: 3 married daughters (23, 25, 38); 1 son (47yo); 3 grandchildren born in 2017      Lives: with wife, son      Left-handed   Caffeine: 3 drinks per day   Social Determinants of Health   Financial Resource Strain: Not on file  Food Insecurity: Not on file  Transportation Needs: Not on file  Physical Activity: Not on file  Stress: Not on file  Social Connections: Not on file  Intimate Partner Violence: Not on file    Family History: Family History  Problem Relation Age of Onset   Heart disease Father 15       Died of MI   Cancer Mother     Review of Systems: All other systems reviewed and are otherwise negative except as noted above.   Physical Exam: Vitals:   03/07/21 0828  BP: 118/68  Pulse: 96  SpO2: 95%  Weight: 188 lb 6.4 oz (85.5 kg)  Height: 6' (1.829 m)     GEN- The patient is well appearing, alert and oriented x 3 today.   HEENT: normocephalic, atraumatic; sclera clear, conjunctiva pink; hearing intact; oropharynx clear; neck supple, no JVP Lymph- no cervical lymphadenopathy Lungs- Clear to ausculation bilaterally, normal work of breathing.  No wheezes, rales, rhonchi Heart- Regular rate and rhythm, no murmurs, rubs or gallops, PMI not laterally displaced GI- soft, non-tender, non-distended, bowel sounds present, no hepatosplenomegaly Extremities- no clubbing or cyanosis. No edema; DP/PT/radial pulses 2+ bilaterally MS- no significant deformity or atrophy Skin- warm and dry, no rash or lesion; ICD pocket well healed Psych- euthymic mood, full affect Neuro- strength and sensation are intact  ICD interrogation- reviewed in detail today,  See PACEART report  EKG:  EKG is ordered today.  Personal review of EKG ordered today shows NSR at 96 bpm with PVCs in bigeminy  Recent Labs: 08/23/2020: ALT 7; TSH 0.554 02/13/2021: B Natriuretic Peptide 212.1 02/14/2021: BUN 22; Creatinine, Ser 1.25; Hemoglobin 12.7; Magnesium 1.8; Platelets 104; Potassium 4.3;  Sodium 138   Wt Readings from Last 3 Encounters:  03/07/21 188 lb 6.4 oz (85.5 kg)  02/14/21 185 lb 3 oz (84 kg)  09/25/20 174 lb 3.2 oz (79 kg)     Other studies Reviewed: Additional studies/ records that were reviewed today include: Previous EP and recent admission notes   Assessment and Plan:  1.  Chronic systolic dysfunction s/p St. Jude dual chamber ICD  euvolemic today Stable on an appropriate medical regimen Normal ICD function for device at ERI as of 02/13/21 See [facility] report No changes today Update Echo as last > 1 year ago.  2. QT Looks OK today accounting for PVCs.  Prolonged during recent admission. Felt to be due to methadone.   3. ICM Denies s/s ischemia  Current medicines are reviewed at length with the patient today.    Labs/ tests ordered today include:  No orders of the defined types were placed in this encounter.  Disposition:   Follow up with Dr. Caryl Comes  as usual post gen change  Signed, Shirley Friar, PA-C  03/07/2021 8:35 AM  [facility] [address] [place] [address] [phone] (office) [phone] (fax)

## 2021-03-07 ENCOUNTER — Other Ambulatory Visit: Payer: Self-pay

## 2021-03-07 ENCOUNTER — Ambulatory Visit (INDEPENDENT_AMBULATORY_CARE_PROVIDER_SITE_OTHER): Payer: Self-pay | Admitting: Student

## 2021-03-07 ENCOUNTER — Encounter: Payer: Self-pay | Admitting: Student

## 2021-03-07 VITALS — BP 118/68 | HR 96 | Ht 72.0 in | Wt 188.4 lb

## 2021-03-07 DIAGNOSIS — I255 Ischemic cardiomyopathy: Secondary | ICD-10-CM

## 2021-03-07 DIAGNOSIS — I5022 Chronic systolic (congestive) heart failure: Secondary | ICD-10-CM

## 2021-03-07 LAB — CUP PACEART INCLINIC DEVICE CHECK
Battery Remaining Longevity: 0 mo
Brady Statistic RA Percent Paced: 0.06 %
Brady Statistic RV Percent Paced: 0.14 %
Date Time Interrogation Session: 20220826085738
HighPow Impedance: 72 Ohm
Implantable Lead Implant Date: 20120201
Implantable Lead Implant Date: 20120201
Implantable Lead Location: 753859
Implantable Lead Location: 753860
Implantable Pulse Generator Implant Date: 20120201
Lead Channel Impedance Value: 387.5 Ohm
Lead Channel Impedance Value: 437.5 Ohm
Lead Channel Pacing Threshold Amplitude: 0.75 V
Lead Channel Pacing Threshold Amplitude: 0.75 V
Lead Channel Pacing Threshold Amplitude: 1.75 V
Lead Channel Pacing Threshold Amplitude: 1.75 V
Lead Channel Pacing Threshold Pulse Width: 0.5 ms
Lead Channel Pacing Threshold Pulse Width: 0.5 ms
Lead Channel Pacing Threshold Pulse Width: 0.5 ms
Lead Channel Pacing Threshold Pulse Width: 0.5 ms
Lead Channel Sensing Intrinsic Amplitude: 12 mV
Lead Channel Sensing Intrinsic Amplitude: 3.8 mV
Lead Channel Setting Pacing Amplitude: 2 V
Lead Channel Setting Pacing Amplitude: 3 V
Lead Channel Setting Pacing Pulse Width: 0.5 ms
Lead Channel Setting Sensing Sensitivity: 0.5 mV
Pulse Gen Serial Number: 815096

## 2021-03-07 NOTE — Patient Instructions (Signed)
Testing/Procedures: Your physician has requested that you have an echocardiogram. Echocardiography is a painless test that uses sound waves to create images of your heart. It provides your doctor with information about the size and shape of your heart and how well your heart's chambers and valves are working. This procedure takes approximately one hour. There are no restrictions for this procedure.  Medication Instructions: Your physician recommends that you continue on your current medications as directed. Please refer to the Current Medication list given to you today.  Labwork: Your physician recommends that you return for lab work on: 04/10/2021 BMET and CBC   Procedures/Testing: Your physician has recommended that you have a Generator Change of your device on 04/16/2021. This is a procedure that replaces a Pacemaker ICD generator that is at the end of its service life. The remaining lifespan of a pacemaker is determined during visits to the [facility]. The battery in a pacemaker does not stop suddenly but rather loses its charge slowly, which lets the cardiologist plan the replacement date.  Follow-Up: Your physician recommends that you schedule a follow-up appointment in 10 - 14 days from 04/16/2021 with the Device clinic for a wound check  Your physician recommends that you schedule a follow-up appointment in 3 months from 04/16/2021 with Dr. Caryl Comes.   If you need a refill on your cardiac medications before your next appointment, please call your pharmacy.   -------------------------------------------------------------------------------------------------------------  Please wash with the CHG Soap the night before and morning of procedure (follow instruction page "Preparing For Surgery").   Please report to the [place] [facility] ([facility]) of [facility] at 9:30am ([place], [place] [address])  DO NOT eat or drink anything after midnight the night  before procedure  You may take all of your morning medications the day of your procedure EXCEPT Torsemide and Spironolactone with enough water to get them down safely.  You will need someone to drive you home after the procedure ------------------------------------------------------------------------------------------------------------- [facility] - Preparing For Surgery  Before surgery, you can play an important role. Because skin is not sterile, your skin needs to be as free of germs as possible. You can reduce the number of germs on your skin by washing with CHG (chlorahexidine gluconate) Soap before surgery.  CHG is an antiseptic cleaner which kills germs and bonds with the skin to continue killing germs even after washing.   Please do not use if you have an allergy to CHG or antibacterial soaps.  If your skin becomes reddened/irritated stop using the CHG.   Do not shave (including legs and underarms) for at least 48 hours prior to first CHG shower.  It is OK to shave your face.  Please follow these instructions carefully:  1.  Shower the night before surgery and the morning of surgery with CHG.  2.  If you choose to wash your hair, wash your hair first as usual with your normal shampoo.  3.  After you shampoo, rinse your hair and body thoroughly to remove the shampoo.  4.  Use CHG as you would any other liquid soap.  You can apply CHG directly to the skin and wash gently with a clean washcloth. 5.  Apply the CHG Soap to your body ONLY FROM THE NECK DOWN.  Do not use on open wounds or open sores.  Avoid contact with your eyes, ears, mouth and genitals (private parts).  Wash genitals (private parts) with your normal soap.  6.  Wash thoroughly, paying special attention to the area where your surgery will be performed.  7.  Thoroughly rinse your body with warm water from the neck down.   8.  DO NOT shower/wash with your normal soap after using and rinsing off the CHG soap.  9.  Pat yourself  dry with a clean towel.           10.  Wear clean pajamas.           11.  Place clean sheets on your bed the night of your first shower and do not sleep with pets.  Day of Surgery: Do not apply any deodorants/lotions.  Please wear clean clothes to the hospital/surgery center.

## 2021-03-10 LAB — CUP PACEART REMOTE DEVICE CHECK
Battery Remaining Longevity: 0 mo
Battery Voltage: 2.59 V
Brady Statistic AP VP Percent: 1 %
Brady Statistic AP VS Percent: 1 %
Brady Statistic AS VP Percent: 1 %
Brady Statistic AS VS Percent: 99 %
Brady Statistic RA Percent Paced: 1 %
Brady Statistic RV Percent Paced: 1 %
Date Time Interrogation Session: 20220826044911
HighPow Impedance: 79 Ohm
HighPow Impedance: 79 Ohm
Implantable Lead Implant Date: 20120201
Implantable Lead Implant Date: 20120201
Implantable Lead Location: 753859
Implantable Lead Location: 753860
Implantable Pulse Generator Implant Date: 20120201
Lead Channel Impedance Value: 390 Ohm
Lead Channel Impedance Value: 410 Ohm
Lead Channel Pacing Threshold Amplitude: 0.75 V
Lead Channel Pacing Threshold Amplitude: 1.75 V
Lead Channel Pacing Threshold Pulse Width: 0.5 ms
Lead Channel Pacing Threshold Pulse Width: 0.5 ms
Lead Channel Sensing Intrinsic Amplitude: 12 mV
Lead Channel Sensing Intrinsic Amplitude: 3.2 mV
Lead Channel Setting Pacing Amplitude: 2 V
Lead Channel Setting Pacing Amplitude: 3 V
Lead Channel Setting Pacing Pulse Width: 0.5 ms
Lead Channel Setting Sensing Sensitivity: 0.5 mV
Pulse Gen Serial Number: 815096

## 2021-03-12 ENCOUNTER — Encounter (HOSPITAL_COMMUNITY): Payer: Self-pay | Admitting: Student

## 2021-03-17 DIAGNOSIS — R0602 Shortness of breath: Secondary | ICD-10-CM | POA: Diagnosis not present

## 2021-03-18 NOTE — Discharge Summary (Signed)
Discharge Summary    Patient ID: [name] MRN: [id number]; DOB: [birth year]  Admit date: 02/13/2021 Discharge date: 02/14/21  PCP:  [name], NP   [facility] Providers Cardiologist:  None  Electrophysiologist:  Virl Axe, MD  Advanced Heart Failure:  Glori Bickers, MD       Discharge Diagnoses    Active Problems:   Mechanical breakdown of cardiac pulse generator   Mechanical breakdown of cardiac device   Opioid overdose [facility])    Diagnostic Studies/Procedures    none _____________   History of Present Illness     Per HPI 02/14/21:  HPI is limited because patient is currently acutely altered.  History is obtained primarily from the chart and emergency department handoff.   [name] apparently called in on 02/13/2021 to the cardiology help center concerned that his defibrillator had gone off multiple times he was "dying".  He was eventually convinced to come into the emergency department by our cardiology team after he displayed some reluctance due to loss of insurance.  On arrival to the ED, was hypotensive systolic blood pressures in the mid 70s to 80s and a heart rate in the mid 50s, satting well on room air. Initial EKG showed sinus rhythm without dynamic ST-T changes.    Initial lactate was 1.4.  Ethanol level was less than 10.  BNP was 212.  CBC was within normal limits.  BMP showed an AKI with serum creatinine 1.47 (from 1.10 approximately six months ago).  Initial troponin was 20.  Chest x-ray showed 5 basilar patchy interstitial and airspace opacities.   The ED administered approximately 1.5 L of normal saline.  They also gave naloxone x2 with reported improvement in his mental status and blood pressures. His device was interrogated and showed the the generator was as at EOL.   At the time of my interview in the ED, the patient continued to be altered.  He asked "why does my face feel like this?"  When I inquired about what drugs he may have taken, he  stated that he had "delta 10 from [place]" and that he had gotten it from someone he knew, but declined to give further details. The hospitalist team was consulted and we gave another dose of narcan with improvement in mental status and blood pressures to the mid 13K systolic.     Hospital Course     Consultants: Pulmonary Critical Care  [name] was initially admitted reporting ICD firing.  On interrogation of his device he was found to be at EOL and it was beeping due to this.  There were no discharges from the ICD.  He was hypotensive with blood pressures in the 70s to 80s.  His hypotension resolved with IV fluids.  He was given naloxone with significant improvement in his mental status.  He was extremely altered, which was thought to be due to his chronic methadone use.  He was admitted to the ICU and started on a Narcan drip.  EP was asked to see him and turned off the vibratory alert. He was notified that he has 3 months to get his device changed out.  He decided to leave AMA because he did not want to be on a Narcan drip.  Of note, he has access to Xanax, Valium, Ambien, and powdered fentanyl.  Follow up was arranged after he left and he was notified of this information.   Did the patient have an acute coronary syndrome (MI, NSTEMI, STEMI, etc) this admission?:  No                               Did the patient have a percutaneous coronary intervention (stent / angioplasty)?:  No.       _____________  Discharge Vitals Blood pressure 118/71, pulse (!) 54, temperature 97.9 F (36.6 C), temperature source Oral, resp. rate 20, height 5' 11"  (1.803 m), weight 84 kg, SpO2 94 %.  Filed Weights   02/13/21 2321 02/14/21 0600  Weight: 79 kg 84 kg    Labs & Radiologic Studies    CBC No results for input(s): WBC, NEUTROABS, HGB, HCT, MCV, PLT in the last 72 hours. Basic Metabolic Panel No results for input(s): NA, K, CL, CO2, GLUCOSE, BUN, CREATININE, CALCIUM, MG, PHOS in the last 72  hours. Liver Function Tests No results for input(s): AST, ALT, ALKPHOS, BILITOT, PROT, ALBUMIN in the last 72 hours. No results for input(s): LIPASE, AMYLASE in the last 72 hours. High Sensitivity Troponin:   No results for input(s): TROPONINIHS in the last 720 hours.  BNP Invalid input(s): POCBNP D-Dimer No results for input(s): DDIMER in the last 72 hours. Hemoglobin A1C No results for input(s): HGBA1C in the last 72 hours. Fasting Lipid Panel No results for input(s): CHOL, HDL, LDLCALC, TRIG, CHOLHDL, LDLDIRECT in the last 72 hours. Thyroid Function Tests No results for input(s): TSH, T4TOTAL, T3FREE, THYROIDAB in the last 72 hours.  Invalid input(s): FREET3 _____________  CUP [name] DEVICE CHECK  Result Date: 03/07/2021 ICD check in clinic. Normal device function for device at ERI as of 02/13/21. Thresholds and sensing consistent with previous device measurements. Impedance trends stable over time. No mode switches since last remote, but may also be limited in setting of ERI . No ventricular arrhythmias. Histogram distribution appropriate for patient and level of activity. No changes made this session. Device programmed at appropriate safety margins. Pt enrolled in remote follow-up. Patient education completed. Gen change scheduled today.  CUP PACEART REMOTE DEVICE CHECK  Result Date: 03/10/2021 Scheduled remote reviewed. Normal device function.  Repeat alert for ERI 8/4. In office visit today. LR  Disposition   Pt is being discharged home today in good condition.  Follow-up Plans & Appointments     Follow-up Information     Shirley Friar, PA-C Follow up.   Specialty: Physician Assistant Why: 02/26/21 @ 11:20AM, to discuss and scheduled defibrillator battery change procedure with Dr. Margurite Auerbach information: [address] [place] [address] [phone]                  Discharge Medications   Allergies as of 02/14/2021        Reactions   Darvocet [propoxyphene N-acetaminophen] Anaphylaxis   Throat closes        Medication List     ASK your doctor about these medications    albuterol 108 (90 Base) MCG/ACT inhaler Commonly known as: Ventolin HFA Inhale 2 puffs into the lungs every 6 (six) hours as needed for wheezing or shortness of breath.   ALPRAZolam 1 MG tablet Commonly known as: XANAX Take 1 mg by mouth 4 (four) times daily.   carvedilol 25 MG tablet Commonly known as: COREG Take 25 mg by mouth 2 (two) times daily with a meal.   clopidogrel 75 MG tablet Commonly known as: PLAVIX TAKE ONE TABLET BY MOUTH DAILY   cyanocobalamin 1000 MCG/ML injection Commonly known as: (VITAMIN B-12)  Inject 1 mL (1,000 mcg total) into the muscle every 30 (thirty) days.   dapagliflozin propanediol 10 MG Tabs tablet Commonly known as: Farxiga Take 1 tablet (10 mg total) by mouth daily before breakfast.   diazepam 10 MG tablet Commonly known as: VALIUM Take 20 mg by mouth every 12 (twelve) hours as needed for anxiety.   diclofenac 75 MG EC tablet Commonly known as: VOLTAREN Take 1 tablet (75 mg total) by mouth 2 (two) times daily.   doxycycline 100 MG capsule Commonly known as: VIBRAMYCIN Take 1 capsule (100 mg total) by mouth 2 (two) times daily.   erythromycin ophthalmic ointment Place 1 application into the right eye at bedtime.   ezetimibe 10 MG tablet Commonly known as: ZETIA TAKE 1 TABLET EACH DAY.   furosemide 20 MG tablet Commonly known as: LASIX Take 1 tablet (20 mg total) by mouth daily.   ibuprofen 200 MG tablet Commonly known as: ADVIL Take 800 mg by mouth every 6 (six) hours as needed for moderate pain.   lactulose 10 GM/15ML solution Commonly known as: CHRONULAC Take 15 mLs (10 g total) by mouth 2 (two) times daily as needed for mild constipation.   lisinopril 5 MG tablet Commonly known as: ZESTRIL Take 5 mg by mouth daily.   loperamide 2 MG tablet Commonly known as:  Imodium A-D Take 1.5 tablets (3 mg total) by mouth 4 (four) times daily as needed for diarrhea or loose stools.   losartan 25 MG tablet Commonly known as: COZAAR Take 1 tablet (25 mg total) by mouth daily.   methadone 40 MG disintegrating tablet Commonly known as: METHADOSE Take 200 mg by mouth daily. [facility]   methocarbamol 750 MG tablet Commonly known as: ROBAXIN TAKE 1 OR 2 TABLETS THREE TIMES A DAY AS NEEDED.   naproxen 500 MG tablet Commonly known as: NAPROSYN Take 1 tablet (500 mg total) by mouth 2 (two) times daily as needed for moderate pain.   nitroGLYCERIN 0.4 MG/SPRAY spray Commonly known as: NITROLINGUAL Place 2 sprays under the tongue every 5 (five) minutes x 3 doses as needed for chest pain.   ondansetron 8 MG disintegrating tablet Commonly known as: ZOFRAN-ODT Take 1 tablet (8 mg total) by mouth every 8 (eight) hours as needed for nausea or vomiting.   oxymetazoline 0.05 % nasal spray Commonly known as: AFRIN Place 1 spray into both nostrils 2 (two) times daily.   pantoprazole 40 MG tablet Commonly known as: PROTONIX Take 1 tablet (40 mg total) by mouth daily.   polyethylene glycol powder 17 GM/SCOOP powder Commonly known as: GLYCOLAX/MIRALAX Take 17 g by mouth 2 (two) times daily as needed for mild constipation.   rosuvastatin 40 MG tablet Commonly known as: CRESTOR Take 1 tablet (40 mg total) by mouth every evening.   sildenafil 100 MG tablet Commonly known as: VIAGRA Take 0.5 tablets (50 mg total) by mouth daily as needed for erectile dysfunction.   sildenafil 20 MG tablet Commonly known as: REVATIO TAKE 5 TABLETS AS NEEDED.   spironolactone 25 MG tablet Commonly known as: ALDACTONE TAKE (1/2) TABLET DAILY.   sucralfate 1 g tablet Commonly known as: CARAFATE Take 1 tablet (1 g total) by mouth 4 (four) times daily -  with meals and at bedtime.   sulfamethoxazole-trimethoprim 800-160 MG tablet Commonly known as: BACTRIM  DS Take 1 tablet by mouth 2 (two) times daily.   tamsulosin 0.4 MG Caps capsule Commonly known as: FLOMAX Take 1 capsule (0.4 mg  total) by mouth daily after breakfast.   tamsulosin 0.4 MG Caps capsule Commonly known as: FLOMAX Take 1 capsule (0.4 mg total) by mouth daily after breakfast.   TESTOSTERONE CYPIONATE IM Inject into the muscle every 14 (fourteen) days.   Torsemide 40 MG Tabs Take 20 mg by mouth daily as needed. For swelling in legs   traZODone 100 MG tablet Commonly known as: DESYREL Take 1 tablet (100 mg total) by mouth at bedtime.   triamcinolone 55 MCG/ACT Aero nasal inhaler Commonly known as: Nasacort AQ Place 1 spray into the nose daily.   Vitamin D (Ergocalciferol) 1.25 MG (50000 UNIT) Caps capsule Commonly known as: DRISDOL Take 1 capsule (50,000 Units total) by mouth every 7 (seven) days.   zolpidem 10 MG tablet Commonly known as: AMBIEN Take 10 mg by mouth at bedtime.           Outstanding Labs/Studies   EP follow up for ICD at EOL  Duration of Discharge Encounter   Greater than 30 minutes including physician time.  Signed, Skeet Latch, MD 03/18/2021, 12:07 PM

## 2021-03-19 ENCOUNTER — Telehealth (HOSPITAL_COMMUNITY): Payer: Self-pay | Admitting: Student

## 2021-03-19 NOTE — Telephone Encounter (Signed)
Just an FYI. We have made several attempts to contact this patient including sending a letter to schedule or reschedule their echocardiogram. We will be removing the patient from the echo WQ.  MAILED LETTER LBW  03/12/21 called to schedule and VM was unable to LVM @ 9:59/LBW  03/10/21  called to schedule and VM is ful and not taking messages @ 10:57am/LBW       Thank you

## 2021-03-20 NOTE — Progress Notes (Signed)
Remote ICD transmission.   

## 2021-03-20 NOTE — Addendum Note (Signed)
Addended by: Douglass Rivers D on: 03/20/2021 01:25 PM   Modules accepted: Level of Service

## 2021-03-23 ENCOUNTER — Telehealth: Payer: Self-pay | Admitting: Internal Medicine

## 2021-03-23 NOTE — Telephone Encounter (Signed)
Weekend call note -patient of Dr Halford Chessman  Son tested covid positive thu 03/20/21  Patient not with much symptom - also turned covid positive - 03/22/21. Still positive 03/23/21  On o2 at home but machine not working  Wants anti-viral paxlovid - > to [place] - can take full dose, but has to stop sildenafil while on paxlovid (not on crestor anyways due to weight loss)  - [place] [phone] -> does not carry paxlovid  - [facility]   Paxlovid (nirmatelvir 300/Ritonavir100) - BID x 5 days - for GFR >= 60  PLEASE CHECK MED LIST for the following issues. Please check the 2 different condition related to concomitant medications in alphabetical order  If CASSIUS CULLINANE with DOB 08/17/1960 is on any of the following Strong CYP3A inhibtors - this patient LEGRANDE HAO should withold these concomitant meds so they can start paxlovid stratight away. If taking any of these:  alfuzosin, amiodarone, clozapine, colchicine, dihydroergotamine, dronedarone, ergotamine, flecainide, lovastatin, lurasidone, methylergonovine, midazolam [oral], pethidine, pimozide, propafenone, propoxyphene, quinidine, ranolazine, sildenafil simvastatin, triazolam).   If   Rozann Lesches  with dob 05-15-1961 Is on any of these other strong CYP3A inducers then starting paxlovid should be delayed and the following meds should wash out first. These are dapalutamide, carbamazepine, phenobarbital, phenytoin, rifampin, St John's wort) - let me know immediately and we should delay starting paxlovid by some days even if he stops these medication.    PLEASE INFORM Rozann Lesches  OF FOLLOWING SIDE EFFECTS  Side effects - all < 5%  - skin rash (and veyr rare a conditon called TEN) - angiomedia  - myalgia - jaundice - high bP (1%) - loss of taste  - diarrhea - rebound  - 25%      Results for BRIAN, ZEITLIN (MRN [id number]) as of 03/23/2021 14:35  Ref. Range 02/14/2021 04:44  Creatinine Latest  Ref Range: 0.61 - 1.24 mg/dL 1.25 (H)  Calcium Latest Ref Range: 8.9 - 10.3 mg/dL 8.2 (L)  Anion gap Latest Ref Range: 5 - 15  6  Magnesium Latest Ref Range: 1.7 - 2.4 mg/dL 1.8  GFR, Estimated Latest Ref Range: >60 mL/min >60     Current Outpatient Medications:    albuterol (VENTOLIN HFA) 108 (90 Base) MCG/ACT inhaler, Inhale 2 puffs into the lungs every 6 (six) hours as needed for wheezing or shortness of breath., Disp: 18 g, Rfl: 5   ALPRAZolam (XANAX) 1 MG tablet, Take 1 mg by mouth 4 (four) times daily. , Disp: , Rfl:    carvedilol (COREG) 25 MG tablet, Take 25 mg by mouth 2 (two) times daily with a meal., Disp: , Rfl:    clopidogrel (PLAVIX) 75 MG tablet, TAKE ONE TABLET BY MOUTH DAILY, Disp: 90 tablet, Rfl: 0   cyanocobalamin (,VITAMIN B-12,) 1000 MCG/ML injection, Inject 1 mL (1,000 mcg total) into the muscle every 30 (thirty) days., Disp: 1 mL, Rfl: 11   dapagliflozin propanediol (FARXIGA) 10 MG TABS tablet, Take 1 tablet (10 mg total) by mouth daily before breakfast., Disp: 30 tablet, Rfl: 6   diazepam (VALIUM) 10 MG tablet, Take 20 mg by mouth every 12 (twelve) hours as needed for anxiety. , Disp: , Rfl: 2   diclofenac (VOLTAREN) 75 MG EC tablet, Take 1 tablet (75 mg total) by mouth 2 (two) times daily., Disp: 20 tablet, Rfl: 0   doxycycline (VIBRAMYCIN) 100 MG capsule, Take 1 capsule (100 mg total) by  mouth 2 (two) times daily., Disp: 14 capsule, Rfl: 0   erythromycin ophthalmic ointment, Place 1 application into the right eye at bedtime., Disp: 3.5 g, Rfl: 1   ezetimibe (ZETIA) 10 MG tablet, TAKE 1 TABLET EACH DAY., Disp: 90 tablet, Rfl: 0   furosemide (LASIX) 20 MG tablet, Take 1 tablet (20 mg total) by mouth daily., Disp: 30 tablet, Rfl: 3   ibuprofen (ADVIL,MOTRIN) 200 MG tablet, Take 800 mg by mouth every 6 (six) hours as needed for moderate pain., Disp: , Rfl:    lactulose (CHRONULAC) 10 GM/15ML solution, Take 15 mLs (10 g total) by mouth 2 (two) times daily as needed for mild  constipation., Disp: 1892 mL, Rfl: 0   lisinopril (PRINIVIL,ZESTRIL) 5 MG tablet, Take 5 mg by mouth daily., Disp: , Rfl:    loperamide (IMODIUM A-D) 2 MG tablet, Take 1.5 tablets (3 mg total) by mouth 4 (four) times daily as needed for diarrhea or loose stools., Disp: 30 tablet, Rfl: 2   losartan (COZAAR) 25 MG tablet, Take 1 tablet (25 mg total) by mouth daily., Disp: 90 tablet, Rfl: 3   methadone (METHADOSE) 40 MG disintegrating tablet, Take 200 mg by mouth daily. [facility], Disp: , Rfl:    methocarbamol (ROBAXIN) 750 MG tablet, TAKE 1 OR 2 TABLETS THREE TIMES A DAY AS NEEDED., Disp: 180 tablet, Rfl: 5   naproxen (NAPROSYN) 500 MG tablet, Take 1 tablet (500 mg total) by mouth 2 (two) times daily as needed for moderate pain., Disp: 60 tablet, Rfl: 2   nitroGLYCERIN (NITROLINGUAL) 0.4 MG/SPRAY spray, Place 2 sprays under the tongue every 5 (five) minutes x 3 doses as needed for chest pain. , Disp: , Rfl:    ondansetron (ZOFRAN-ODT) 8 MG disintegrating tablet, Take 1 tablet (8 mg total) by mouth every 8 (eight) hours as needed for nausea or vomiting., Disp: 90 tablet, Rfl: 1   oxymetazoline (AFRIN) 0.05 % nasal spray, Place 1 spray into both nostrils 2 (two) times daily., Disp: , Rfl:    pantoprazole (PROTONIX) 40 MG tablet, Take 1 tablet (40 mg total) by mouth daily., Disp: 90 tablet, Rfl: 1   polyethylene glycol powder (GLYCOLAX/MIRALAX) 17 GM/SCOOP powder, Take 17 g by mouth 2 (two) times daily as needed for mild constipation., Disp: , Rfl:    rosuvastatin (CRESTOR) 40 MG tablet, Take 1 tablet (40 mg total) by mouth every evening., Disp: 30 tablet, Rfl: 2   sildenafil (REVATIO) 20 MG tablet, TAKE 5 TABLETS AS NEEDED., Disp: 90 tablet, Rfl: 0   sildenafil (VIAGRA) 100 MG tablet, Take 0.5 tablets (50 mg total) by mouth daily as needed for erectile dysfunction., Disp: 30 tablet, Rfl: 0   spironolactone (ALDACTONE) 25 MG tablet, TAKE (1/2) TABLET DAILY., Disp: 45 tablet, Rfl: 1    sucralfate (CARAFATE) 1 g tablet, Take 1 tablet (1 g total) by mouth 4 (four) times daily -  with meals and at bedtime., Disp: 120 tablet, Rfl: 0   sulfamethoxazole-trimethoprim (BACTRIM DS) 800-160 MG tablet, Take 1 tablet by mouth 2 (two) times daily., Disp: 14 tablet, Rfl: 0   tamsulosin (FLOMAX) 0.4 MG CAPS capsule, Take 1 capsule (0.4 mg total) by mouth daily after breakfast., Disp: 90 capsule, Rfl: 0   tamsulosin (FLOMAX) 0.4 MG CAPS capsule, Take 1 capsule (0.4 mg total) by mouth daily after breakfast., Disp: 90 capsule, Rfl: 0   TESTOSTERONE CYPIONATE IM, Inject into the muscle every 14 (fourteen) days., Disp: , Rfl:    torsemide  40 MG TABS, Take 20 mg by mouth daily as needed. For swelling in legs, Disp: 90 tablet, Rfl: 3   traZODone (DESYREL) 100 MG tablet, Take 1 tablet (100 mg total) by mouth at bedtime., Disp: 90 tablet, Rfl: 0   triamcinolone (NASACORT AQ) 55 MCG/ACT AERO nasal inhaler, Place 1 spray into the nose daily., Disp: 1 each, Rfl: 12   Vitamin D, Ergocalciferol, (DRISDOL) 1.25 MG (50000 UNIT) CAPS capsule, Take 1 capsule (50,000 Units total) by mouth every 7 (seven) days., Disp: 12 capsule, Rfl: 1   zolpidem (AMBIEN) 10 MG tablet, Take 10 mg by mouth at bedtime. , Disp: , Rfl:      SIGNATURE    Dr. Brand Males, M.D., F.C.C.P,  Pulmonary and Critical Care Medicine Staff Physician, [place] Director - Interstitial Lung Disease  Program  Pulmonary [place] at [place], [place], [address]  NPI Number:  NPI #[id number]  Pager: [phone], If no answer  -> Check AMION or Try [phone] Telephone (clinical office): [phone] Telephone (research): [phone]  2:47 PM 03/23/2021

## 2021-04-01 NOTE — Telephone Encounter (Signed)
Several attempts have been made to contact this pt to get his Echo scheduled and to let him know he needs to come by the office to get the scrub soap prior to his procedure on 10/5. He has not answered, VM is full. I sent him a letter today asking him to please call our office.

## 2021-04-07 NOTE — Progress Notes (Signed)
Established Patient Office Visit  Subjective:  Patient ID: [name], male    DOB: November 07, 1960  Age: 60 y.o. MRN: [id number]  CC:  Chief Complaint  Patient presents with   Follow-up    Patient states he is here for a follow up and wants to discuss battery and discuss why hes still not walking. Patient needs an referral for cancer.    HPI [name] presents for follow up   Notes ongoing weakness. Unsure if progression of heart disease or if there is further underlying pathology. Weakness mostly in legs. Progressive. Notes loss of muscle tone. Limited activity, acknowledges deconditioning as aspect of this.  Otherwise stable. Stressors at home with ex wife and his son, 32, who he is trying to look out for.  Past Medical History:  Diagnosis Date   Anxiety    Automatic implantable cardiac defibrillator [employer]    Analyze ST   Benign prostatic hypertrophy    Benzodiazepine dependence (HCC)    chronic   CAD (coronary artery disease)    Last cath 2/12. 3-v CAD. Failed PCI of distal RCAc  CATH [facility] 4/13 with DES to  LAD X2   CHF (congestive heart failure) (HCC)    Chronic back pain    lumbar stenosis   Chronic systolic heart failure (HCC)    EF 20-25%. s/p ST[employer] ICD   Depression    DJD (degenerative joint disease)    History of testicular cancer    Ischemic cardiomyopathy    Myocardial infarction [facility])    Narcotic dependence ([place])    chronic   OSA on CPAP    Polycythemia, secondary    S/p hematology consultation for polycythema on 01/06/18.  S/p bone marrow biopsy on 12/31/17 that was negative for myeloproliferative disorder.  Feels secondary to sleep apnea, active smoking, testosterone supplementation, nocturnal hypoxia   TIA (transient ischemic attack)    Vitamin B12 deficiency 07/22/2016    Past Surgical History:  Procedure Laterality Date   ASD REPAIR, SINUS VENOSUS     CARDIAC DEFIBRILLATOR PLACEMENT  08/2010   HERNIA REPAIR     LEFT HEART CATHETERIZATION  WITH CORONARY ANGIOGRAM N/A 06/14/2014   Procedure: LEFT HEART CATHETERIZATION WITH CORONARY ANGIOGRAM;  Surgeon: Jolaine Artist, MD;  Location: [facility] CATH LAB;  Service: Cardiovascular;  Laterality: N/A;   TESTICLE SURGERY     testicular cancer surgery left testicle removed    Family History  Problem Relation Age of Onset   Heart disease Father 28       Died of MI   Cancer Mother     Social History   Socioeconomic History   Marital status: Divorced    Spouse name: Not on file   Number of children: 4   Years of education: Not on file   Highest education level: Not on file  Occupational History   Not on file  Tobacco Use   Smoking status: Every Day    Packs/day: 0.50    Years: 29.00    Pack years: 14.50    Types: Cigarettes, E-cigarettes    Last attempt to quit: 07/14/2015    Years since quitting: 5.7   Smokeless tobacco: Never   Tobacco comments:    no cigarette in 4 days but yes to e-cig  Vaping Use   Vaping Use: Every day   Start date: 07/14/2015  Substance and Sexual Activity   Alcohol use: No    Alcohol/week: 0.0 standard drinks   Drug use:  Yes    Types: Marijuana    Comment: Urine showed THC   Sexual activity: Yes    Partners: Male  Other Topics Concern   Not on file  Social History Narrative   Marital status: married x 12 years; wife is severe alcoholic.      Children: 3 married daughters (23, 75, 55); 1 son (24yo); 3 grandchildren born in 2017      Lives: with wife, son      Left-handed   Caffeine: 3 drinks per day   Social Determinants of Health   Financial Resource Strain: Not on file  Food Insecurity: Not on file  Transportation Needs: Not on file  Physical Activity: Not on file  Stress: Not on file  Social Connections: Not on file  Intimate Partner Violence: Not on file    Outpatient Medications Prior to Visit  Medication Sig Dispense Refill   albuterol (VENTOLIN HFA) 108 (90 Base) MCG/ACT inhaler Inhale 2 puffs into the lungs every 6 (six)  hours as needed for wheezing or shortness of breath. 18 g 5   ALPRAZolam (XANAX) 1 MG tablet Take 1 mg by mouth 4 (four) times daily.      carvedilol (COREG) 25 MG tablet Take 25 mg by mouth 2 (two) times daily with a meal.     clopidogrel (PLAVIX) 75 MG tablet TAKE ONE TABLET BY MOUTH DAILY 90 tablet 0   cyanocobalamin (,VITAMIN B-12,) 1000 MCG/ML injection Inject 1 mL (1,000 mcg total) into the muscle every 30 (thirty) days. 1 mL 11   dapagliflozin propanediol (FARXIGA) 10 MG TABS tablet Take 1 tablet (10 mg total) by mouth daily before breakfast. 30 tablet 6   diazepam (VALIUM) 10 MG tablet Take 20 mg by mouth every 12 (twelve) hours as needed for anxiety.   2   diclofenac (VOLTAREN) 75 MG EC tablet Take 1 tablet (75 mg total) by mouth 2 (two) times daily. 20 tablet 0   doxycycline (VIBRAMYCIN) 100 MG capsule Take 1 capsule (100 mg total) by mouth 2 (two) times daily. 14 capsule 0   erythromycin ophthalmic ointment Place 1 application into the right eye at bedtime. 3.5 g 1   ezetimibe (ZETIA) 10 MG tablet TAKE 1 TABLET EACH DAY. 90 tablet 0   furosemide (LASIX) 20 MG tablet Take 1 tablet (20 mg total) by mouth daily. 30 tablet 3   ibuprofen (ADVIL,MOTRIN) 200 MG tablet Take 800 mg by mouth every 6 (six) hours as needed for moderate pain.     lactulose (CHRONULAC) 10 GM/15ML solution Take 15 mLs (10 g total) by mouth 2 (two) times daily as needed for mild constipation. 1892 mL 0   lisinopril (PRINIVIL,ZESTRIL) 5 MG tablet Take 5 mg by mouth daily.     loperamide (IMODIUM A-D) 2 MG tablet Take 1.5 tablets (3 mg total) by mouth 4 (four) times daily as needed for diarrhea or loose stools. 30 tablet 2   losartan (COZAAR) 25 MG tablet Take 1 tablet (25 mg total) by mouth daily. 90 tablet 3   methadone (METHADOSE) 40 MG disintegrating tablet Take 200 mg by mouth daily. [facility]     methocarbamol (ROBAXIN) 750 MG tablet TAKE 1 OR 2 TABLETS THREE TIMES A DAY AS NEEDED. 180 tablet 5    naproxen (NAPROSYN) 500 MG tablet Take 1 tablet (500 mg total) by mouth 2 (two) times daily as needed for moderate pain. 60 tablet 2   nitroGLYCERIN (NITROLINGUAL) 0.4 MG/SPRAY spray Place 2 sprays  under the tongue every 5 (five) minutes x 3 doses as needed for chest pain.      ondansetron (ZOFRAN-ODT) 8 MG disintegrating tablet Take 1 tablet (8 mg total) by mouth every 8 (eight) hours as needed for nausea or vomiting. 90 tablet 1   oxymetazoline (AFRIN) 0.05 % nasal spray Place 1 spray into both nostrils 2 (two) times daily.     pantoprazole (PROTONIX) 40 MG tablet Take 1 tablet (40 mg total) by mouth daily. 90 tablet 1   polyethylene glycol powder (GLYCOLAX/MIRALAX) 17 GM/SCOOP powder Take 17 g by mouth 2 (two) times daily as needed for mild constipation.     rosuvastatin (CRESTOR) 40 MG tablet Take 1 tablet (40 mg total) by mouth every evening. 30 tablet 2   sildenafil (REVATIO) 20 MG tablet TAKE 5 TABLETS AS NEEDED. 90 tablet 0   sildenafil (VIAGRA) 100 MG tablet Take 0.5 tablets (50 mg total) by mouth daily as needed for erectile dysfunction. 30 tablet 0   spironolactone (ALDACTONE) 25 MG tablet TAKE (1/2) TABLET DAILY. 45 tablet 1   sucralfate (CARAFATE) 1 g tablet Take 1 tablet (1 g total) by mouth 4 (four) times daily -  with meals and at bedtime. 120 tablet 0   sulfamethoxazole-trimethoprim (BACTRIM DS) 800-160 MG tablet Take 1 tablet by mouth 2 (two) times daily. 14 tablet 0   tamsulosin (FLOMAX) 0.4 MG CAPS capsule Take 1 capsule (0.4 mg total) by mouth daily after breakfast. 90 capsule 0   tamsulosin (FLOMAX) 0.4 MG CAPS capsule Take 1 capsule (0.4 mg total) by mouth daily after breakfast. 90 capsule 0   TESTOSTERONE CYPIONATE IM Inject into the muscle every 14 (fourteen) days.     torsemide 40 MG TABS Take 20 mg by mouth daily as needed. For swelling in legs 90 tablet 3   traZODone (DESYREL) 100 MG tablet Take 1 tablet (100 mg total) by mouth at bedtime. 90 tablet 0   triamcinolone  (NASACORT AQ) 55 MCG/ACT AERO nasal inhaler Place 1 spray into the nose daily. 1 each 12   Vitamin D, Ergocalciferol, (DRISDOL) 1.25 MG (50000 UNIT) CAPS capsule Take 1 capsule (50,000 Units total) by mouth every 7 (seven) days. 12 capsule 1   zolpidem (AMBIEN) 10 MG tablet Take 10 mg by mouth at bedtime.      No facility-administered medications prior to visit.    Allergies  Allergen Reactions   Darvocet [Propoxyphene N-Acetaminophen] Anaphylaxis    Throat closes    ROS Review of Systems  Constitutional: Negative.   HENT: Negative.    Eyes: Negative.   Respiratory: Negative.    Cardiovascular: Negative.   Gastrointestinal: Negative.   Genitourinary: Negative.   Musculoskeletal: Negative.   Skin: Negative.   Neurological: Negative.   Psychiatric/Behavioral: Negative.    All other systems reviewed and are negative.    Objective:    Physical Exam Constitutional:      General: He is not in acute distress.    Appearance: Normal appearance. He is normal weight. He is not ill-appearing, toxic-appearing or diaphoretic.  Cardiovascular:     Rate and Rhythm: Normal rate and regular rhythm.     Heart sounds: Normal heart sounds. No murmur heard.   No friction rub. No gallop.  Pulmonary:     Effort: Pulmonary effort is normal. No respiratory distress.     Breath sounds: Normal breath sounds. No stridor. No wheezing, rhonchi or rales.  Chest:     Chest wall: No tenderness.  Skin:  General: Skin is warm and dry.  Neurological:     General: No focal deficit present.     Mental Status: He is alert and oriented to person, place, and time. Mental status is at baseline.     Cranial Nerves: No cranial nerve deficit.     Sensory: No sensory deficit.     Motor: Weakness present.     Coordination: Coordination normal.     Gait: Gait normal.     Deep Tendon Reflexes: Reflexes normal.  Psychiatric:        Mood and Affect: Mood normal.        Behavior: Behavior normal.         Thought Content: Thought content normal.        Judgment: Judgment normal.    Pulse 66   Temp 98.3 F (36.8 C) (Temporal)   Resp 16   Ht 5' 11"  (1.803 m)   SpO2 93%   BMI 25.83 kg/m  Wt Readings from Last 3 Encounters:  03/07/21 188 lb 6.4 oz (85.5 kg)  02/14/21 185 lb 3 oz (84 kg)  09/25/20 174 lb 3.2 oz (79 kg)     Health Maintenance Due  Topic Date Due   COVID-19 Vaccine (2 - [facility] risk series) 10/18/2019   INFLUENZA VACCINE  02/10/2021    There are no preventive care reminders to display for this patient.  Lab Results  Component Value Date   TSH 0.554 08/23/2020   Lab Results  Component Value Date   WBC 4.1 02/14/2021   HGB 12.7 (L) 02/14/2021   HCT 37.3 (L) 02/14/2021   MCV 100.3 (H) 02/14/2021   PLT 104 (L) 02/14/2021   Lab Results  Component Value Date   NA 138 02/14/2021   K 4.3 02/14/2021   CO2 25 02/14/2021   GLUCOSE 112 (H) 02/14/2021   BUN 22 02/14/2021   CREATININE 1.25 (H) 02/14/2021   BILITOT 0.6 08/23/2020   ALKPHOS 95 08/23/2020   AST 13 08/23/2020   ALT 7 08/23/2020   PROT 7.6 08/23/2020   ALBUMIN 4.7 08/23/2020   CALCIUM 8.2 (L) 02/14/2021   ANIONGAP 6 02/14/2021   GFR 84.75 01/08/2017   Lab Results  Component Value Date   CHOL 199 08/23/2020   Lab Results  Component Value Date   HDL 40 08/23/2020   Lab Results  Component Value Date   LDLCALC 129 (H) 08/23/2020   Lab Results  Component Value Date   TRIG 170 (H) 08/23/2020   Lab Results  Component Value Date   CHOLHDL 5.0 08/23/2020   Lab Results  Component Value Date   HGBA1C 5.8 (H) 08/23/2020      Assessment & Plan:   Problem List Items Addressed This Visit       Respiratory   Chronic respiratory failure with hypoxia ([place]) - Primary   Acute on chronic respiratory failure with hypoxia (HCC)    No orders of the defined types were placed in this encounter.   Follow-up: No follow-ups on file.   PLAN A number of factors may contribute to his  condition. Would prefer reassurance from cardiology before onc referral as he has no labs or symptoms specific to onc work up Has been dismissed from home health in past. He is working with his insurance to get this set up again. Reassured him that I will continue to sign orders. Patient encouraged to call clinic with any questions, comments, or concerns.  [name], NP

## 2021-04-10 ENCOUNTER — Other Ambulatory Visit: Payer: Self-pay

## 2021-04-15 NOTE — Pre-Procedure Instructions (Signed)
Attempted to call patient regarding procedure instructions for tomorrow.  Unable to leave voice mail, mail box is full.  Wanted patient to come at 8:30 so we could get echo done on tomorrows visit

## 2021-04-16 ENCOUNTER — Ambulatory Visit (HOSPITAL_COMMUNITY): Admission: RE | Admit: 2021-04-16 | Payer: Self-pay | Source: Ambulatory Visit | Admitting: Internal Medicine

## 2021-04-16 ENCOUNTER — Encounter (HOSPITAL_COMMUNITY): Admission: RE | Payer: Self-pay | Source: Ambulatory Visit

## 2021-04-16 SURGERY — ICD GENERATOR CHANGEOUT

## 2021-04-21 ENCOUNTER — Other Ambulatory Visit: Payer: Self-pay | Admitting: *Deleted

## 2021-04-21 ENCOUNTER — Other Ambulatory Visit: Payer: Self-pay

## 2021-04-21 DIAGNOSIS — I5022 Chronic systolic (congestive) heart failure: Secondary | ICD-10-CM

## 2021-04-21 DIAGNOSIS — I255 Ischemic cardiomyopathy: Secondary | ICD-10-CM | POA: Diagnosis not present

## 2021-04-21 LAB — BASIC METABOLIC PANEL
BUN/Creatinine Ratio: 12 (ref 10–24)
BUN: 13 mg/dL (ref 8–27)
CO2: 26 mmol/L (ref 20–29)
Calcium: 9.1 mg/dL (ref 8.6–10.2)
Chloride: 100 mmol/L (ref 96–106)
Creatinine, Ser: 1.1 mg/dL (ref 0.76–1.27)
Glucose: 99 mg/dL (ref 70–99)
Potassium: 4 mmol/L (ref 3.5–5.2)
Sodium: 141 mmol/L (ref 134–144)
eGFR: 76 mL/min/{1.73_m2} (ref >59)

## 2021-04-21 LAB — CBC
Hematocrit: 38.4 % (ref 37.5–51.0)
Hemoglobin: 13.3 g/dL (ref 13.0–17.7)
MCH: 32.4 pg (ref 26.6–33.0)
MCHC: 34.6 g/dL (ref 31.5–35.7)
MCV: 94 fL (ref 79–97)
Platelets: 153 10*3/uL (ref 150–450)
RBC: 4.1 x10E6/uL — ABNORMAL LOW (ref 4.14–5.80)
RDW: 12.3 % (ref 11.6–15.4)
WBC: 4.5 10*3/uL (ref 3.4–10.8)

## 2021-04-23 ENCOUNTER — Encounter (HOSPITAL_COMMUNITY): Payer: Self-pay | Admitting: Internal Medicine

## 2021-04-30 ENCOUNTER — Ambulatory Visit: Payer: Self-pay

## 2021-05-15 DIAGNOSIS — M21371 Foot drop, right foot: Secondary | ICD-10-CM | POA: Diagnosis not present

## 2021-05-15 DIAGNOSIS — I509 Heart failure, unspecified: Secondary | ICD-10-CM | POA: Diagnosis not present

## 2021-05-15 DIAGNOSIS — M79671 Pain in right foot: Secondary | ICD-10-CM | POA: Diagnosis not present

## 2021-05-15 DIAGNOSIS — D45 Polycythemia vera: Secondary | ICD-10-CM | POA: Diagnosis not present

## 2021-05-20 NOTE — Progress Notes (Signed)
Established Patient Office Visit  Subjective:  Patient ID: [name], male    DOB: 1960-11-11  Age: 60 y.o. MRN: [id number]  CC:  Chief Complaint  Patient presents with  . Transitions Of Care    Patient states he is here for transitions of care. Patient would like to discuss referrals  . Medication Refill    HPI [name] presents for visit to est care.  Histories reviewed and updated with patient.   He has an extensive medical history but all conditions stable at this time. He is well established with cardiology, pulmonary, psychiatry, and hematology.  Notes he is due for flu shot. Would like today.  Otherwise needs most meds refilled.   No further concerns.   Past Medical History:  Diagnosis Date  . Anxiety   . Automatic implantable cardiac defibrillator [employer] [facility]    Analyze ST  . Benign prostatic hypertrophy   . Benzodiazepine dependence (HCC)    chronic  . CAD (coronary artery disease)    Last cath 2/12. 3-v CAD. Failed PCI of distal RCAc  CATH [facility] 4/13 with DES to  LAD X2  . CHF (congestive heart failure) ([place])   . Chronic back pain    lumbar stenosis  . Chronic systolic heart failure (HCC)    EF 20-25%. s/p ST. Jude ICD  . Depression   . DJD (degenerative joint disease)   . History of testicular cancer   . Ischemic cardiomyopathy   . Myocardial infarction ([place])   . Narcotic dependence (HCC)    chronic  . OSA on CPAP   . Polycythemia, secondary    S/p hematology consultation for polycythema on 01/06/18.  S/p bone marrow biopsy on 12/31/17 that was negative for myeloproliferative disorder.  Feels secondary to sleep apnea, active smoking, testosterone supplementation, nocturnal hypoxia  . TIA (transient ischemic attack)   . Vitamin B12 deficiency 07/22/2016    Past Surgical History:  Procedure Laterality Date  . ASD REPAIR, SINUS VENOSUS    . CARDIAC DEFIBRILLATOR PLACEMENT  08/2010  . HERNIA REPAIR    . LEFT HEART CATHETERIZATION WITH CORONARY  ANGIOGRAM N/A 06/14/2014   Procedure: LEFT HEART CATHETERIZATION WITH CORONARY ANGIOGRAM;  Surgeon: Jolaine Artist, MD;  Location: [facility] CATH LAB;  Service: Cardiovascular;  Laterality: N/A;  . TESTICLE SURGERY     testicular cancer surgery left testicle removed    Family History  Problem Relation Age of Onset  . Heart disease Father 71       Died of MI  . Cancer Mother     Social History   Socioeconomic History  . Marital status: Divorced    Spouse name: Not on file  . Number of children: 4  . Years of education: Not on file  . Highest education level: Not on file  Occupational History  . Not on file  Tobacco Use  . Smoking status: Every Day    Packs/day: 0.50    Years: 29.00    Pack years: 14.50    Types: Cigarettes, E-cigarettes    Last attempt to quit: 07/14/2015    Years since quitting: 5.8  . Smokeless tobacco: Never  . Tobacco comments:    no cigarette in 4 days but yes to e-cig  Vaping Use  . Vaping Use: Every day  . Start date: 07/14/2015  Substance and Sexual Activity  . Alcohol use: No    Alcohol/week: 0.0 standard drinks  . Drug use: Yes    Types: Marijuana  Comment: Urine showed THC  . Sexual activity: Yes    Partners: Male  Other Topics Concern  . Not on file  Social History Narrative   Marital status: married x 12 years; wife is severe alcoholic.      Children: 3 married daughters (23, 35, 30); 1 son (71yo); 3 grandchildren born in 2017      Lives: with wife, son      Left-handed   Caffeine: 3 drinks per day   Social Determinants of Health   Financial Resource Strain: Not on file  Food Insecurity: Not on file  Transportation Needs: Not on file  Physical Activity: Not on file  Stress: Not on file  Social Connections: Not on file  Intimate Partner Violence: Not on file    Outpatient Medications Prior to Visit  Medication Sig Dispense Refill  . ALPRAZolam (XANAX) 1 MG tablet Take 1 mg by mouth 4 (four) times daily.     . carvedilol  (COREG) 25 MG tablet Take 25 mg by mouth 2 (two) times daily with a meal.    . diazepam (VALIUM) 10 MG tablet Take 20 mg by mouth every 12 (twelve) hours as needed for anxiety.   2  . diclofenac (VOLTAREN) 75 MG EC tablet Take 1 tablet (75 mg total) by mouth 2 (two) times daily. 20 tablet 0  . doxycycline (VIBRAMYCIN) 100 MG capsule Take 1 capsule (100 mg total) by mouth 2 (two) times daily. 14 capsule 0  . erythromycin ophthalmic ointment Place 1 application into the right eye at bedtime. 3.5 g 1  . ibuprofen (ADVIL,MOTRIN) 200 MG tablet Take 800 mg by mouth every 6 (six) hours as needed for moderate pain.    Marland Kitchen lisinopril (PRINIVIL,ZESTRIL) 5 MG tablet Take 5 mg by mouth daily.    . methadone (METHADOSE) 40 MG disintegrating tablet Take 200 mg by mouth daily. [facility]    . naproxen (NAPROSYN) 500 MG tablet Take 1 tablet (500 mg total) by mouth 2 (two) times daily as needed for moderate pain. 60 tablet 2  . nitroGLYCERIN (NITROLINGUAL) 0.4 MG/SPRAY spray Place 2 sprays under the tongue every 5 (five) minutes x 3 doses as needed for chest pain.     Marland Kitchen oxymetazoline (AFRIN) 0.05 % nasal spray Place 1 spray into both nostrils 2 (two) times daily.    . polyethylene glycol powder (GLYCOLAX/MIRALAX) 17 GM/SCOOP powder Take 17 g by mouth 2 (two) times daily as needed for mild constipation.    . sucralfate (CARAFATE) 1 g tablet Take 1 tablet (1 g total) by mouth 4 (four) times daily -  with meals and at bedtime. 120 tablet 0  . TESTOSTERONE CYPIONATE IM Inject into the muscle every 14 (fourteen) days.    Marland Kitchen zolpidem (AMBIEN) 10 MG tablet Take 10 mg by mouth at bedtime.     Marland Kitchen albuterol (VENTOLIN HFA) 108 (90 Base) MCG/ACT inhaler Inhale 2 puffs into the lungs every 6 (six) hours as needed for wheezing or shortness of breath. 18 g 5  . clonazePAM (KLONOPIN) 1 MG tablet Take 1 mg by mouth 3 (three) times daily as needed. (Patient not taking: No sig reported)    . clopidogrel (PLAVIX) 75 MG  tablet TAKE 1 TABLET EACH DAY. 90 tablet 0  . cyanocobalamin (,VITAMIN B-12,) 1000 MCG/ML injection Inject 1 mL (1,000 mcg total) into the muscle every 30 (thirty) days. 1 mL 11  . dapagliflozin propanediol (FARXIGA) 10 MG TABS tablet Take 10 mg by mouth  daily before breakfast. 30 tablet 6  . ezetimibe (ZETIA) 10 MG tablet TAKE 1 TABLET EACH DAY. 90 tablet 0  . lactulose (CHRONULAC) 10 GM/15ML solution Take 15 mLs (10 g total) by mouth 2 (two) times daily as needed for mild constipation. 1892 mL 0  . loperamide (IMODIUM A-D) 2 MG tablet Take 1.5 tablets (3 mg total) by mouth 4 (four) times daily as needed for diarrhea or loose stools. 30 tablet 2  . losartan (COZAAR) 25 MG tablet Take 1 tablet (25 mg total) by mouth daily. 90 tablet 3  . methocarbamol (ROBAXIN) 750 MG tablet TAKE 1 OR 2 TABLETS THREE TIMES A DAY AS NEEDED. 180 tablet 5  . ondansetron (ZOFRAN-ODT) 8 MG disintegrating tablet Take 1 tablet (8 mg total) by mouth every 8 (eight) hours as needed for nausea or vomiting. 90 tablet 1  . pantoprazole (PROTONIX) 40 MG tablet Take 1 tablet (40 mg total) by mouth daily. 90 tablet 1  . rosuvastatin (CRESTOR) 40 MG tablet Take 1 tablet (40 mg total) by mouth every evening. 30 tablet 2  . sildenafil (REVATIO) 20 MG tablet TAKE 5 TABLETS AS NEEDED. 90 tablet 0  . sildenafil (VIAGRA) 100 MG tablet Take 0.5 tablets (50 mg total) by mouth daily as needed for erectile dysfunction. 30 tablet 0  . spironolactone (ALDACTONE) 25 MG tablet TAKE (1/2) TABLET DAILY. (Patient taking differently: 25 mg. TAKE (1/2) TABLET DAILY.) 45 tablet 1  . sulfamethoxazole-trimethoprim (BACTRIM DS) 800-160 MG tablet Take 1 tablet by mouth 2 (two) times daily. 14 tablet 0  . tamsulosin (FLOMAX) 0.4 MG CAPS capsule Take 1 capsule (0.4 mg total) by mouth daily after breakfast. 90 capsule 0  . torsemide (DEMADEX) 20 MG tablet Take 1 tablet (20 mg total) by mouth daily as needed. For swelling in legs 90 tablet 2  . traZODone  (DESYREL) 100 MG tablet Take 1 tablet (100 mg total) by mouth at bedtime. 90 tablet 0  . triamcinolone (NASACORT AQ) 55 MCG/ACT AERO nasal inhaler Place 1 spray into the nose daily. 1 Inhaler 12  . Vitamin D, Ergocalciferol, (DRISDOL) 1.25 MG (50000 UNIT) CAPS capsule Take 1 capsule (50,000 Units total) by mouth every 7 (seven) days. 12 capsule 1   No facility-administered medications prior to visit.    Allergies  Allergen Reactions  . Darvocet [Propoxyphene N-Acetaminophen] Anaphylaxis    Throat closes    ROS Review of Systems  Constitutional: Negative.   HENT: Negative.    Eyes: Negative.   Respiratory: Negative.    Cardiovascular: Negative.   Gastrointestinal: Negative.   Genitourinary: Negative.   Musculoskeletal: Negative.   Skin: Negative.   Neurological: Negative.   Psychiatric/Behavioral: Negative.    All other systems reviewed and are negative.    Objective:    Physical Exam Constitutional:      General: He is not in acute distress.    Appearance: Normal appearance. He is normal weight. He is not ill-appearing, toxic-appearing or diaphoretic.  Cardiovascular:     Rate and Rhythm: Normal rate and regular rhythm.     Heart sounds: Normal heart sounds. No murmur heard.   No friction rub. No gallop.  Pulmonary:     Effort: Pulmonary effort is normal. No respiratory distress.     Breath sounds: Normal breath sounds. No stridor. No wheezing, rhonchi or rales.  Chest:     Chest wall: No tenderness.  Neurological:     General: No focal deficit present.     Mental Status:  He is alert and oriented to person, place, and time. Mental status is at baseline.  Psychiatric:        Mood and Affect: Mood normal.        Behavior: Behavior normal.        Thought Content: Thought content normal.        Judgment: Judgment normal.    BP 91/62   Pulse 86   Temp 98 F (36.7 C) (Temporal)   Resp 18   Ht 5' 11"  (1.803 m)   Wt 174 lb 3.2 oz (79 kg)   SpO2 95%   BMI 24.30  kg/m  Wt Readings from Last 3 Encounters:  03/07/21 188 lb 6.4 oz (85.5 kg)  02/14/21 185 lb 3 oz (84 kg)  09/25/20 174 lb 3.2 oz (79 kg)     Health Maintenance Due  Topic Date Due  . COVID-19 Vaccine (2 - Janssen risk series) 10/18/2019  . INFLUENZA VACCINE  02/10/2021    There are no preventive care reminders to display for this patient.  Lab Results  Component Value Date   TSH 0.554 08/23/2020   Lab Results  Component Value Date   WBC 4.5 04/21/2021   HGB 13.3 04/21/2021   HCT 38.4 04/21/2021   MCV 94 04/21/2021   PLT 153 04/21/2021   Lab Results  Component Value Date   NA 141 04/21/2021   K 4.0 04/21/2021   CO2 26 04/21/2021   GLUCOSE 99 04/21/2021   BUN 13 04/21/2021   CREATININE 1.10 04/21/2021   BILITOT 0.6 08/23/2020   ALKPHOS 95 08/23/2020   AST 13 08/23/2020   ALT 7 08/23/2020   PROT 7.6 08/23/2020   ALBUMIN 4.7 08/23/2020   CALCIUM 9.1 04/21/2021   ANIONGAP 6 02/14/2021   EGFR 76 04/21/2021   GFR 84.75 01/08/2017   Lab Results  Component Value Date   CHOL 199 08/23/2020   Lab Results  Component Value Date   HDL 40 08/23/2020   Lab Results  Component Value Date   LDLCALC 129 (H) 08/23/2020   Lab Results  Component Value Date   TRIG 170 (H) 08/23/2020   Lab Results  Component Value Date   CHOLHDL 5.0 08/23/2020   Lab Results  Component Value Date   HGBA1C 5.8 (H) 08/23/2020      Assessment & Plan:   Problem List Items Addressed This Visit       Cardiovascular and Mediastinum   COMBINED HEART FAILURE, CHRONIC   Relevant Medications   spironolactone (ALDACTONE) 25 MG tablet   sildenafil (REVATIO) 20 MG tablet   ezetimibe (ZETIA) 10 MG tablet   dapagliflozin propanediol (FARXIGA) 10 MG TABS tablet   torsemide 40 MG TABS   losartan (COZAAR) 25 MG tablet   rosuvastatin (CRESTOR) 40 MG tablet     Respiratory   Chronic respiratory failure with hypoxia (HCC)   Relevant Medications   triamcinolone (NASACORT AQ) 55 MCG/ACT  AERO nasal inhaler   albuterol (VENTOLIN HFA) 108 (90 Base) MCG/ACT inhaler     Other   Chronic back pain   Relevant Medications   methocarbamol (ROBAXIN) 750 MG tablet   traZODone (DESYREL) 100 MG tablet   Constipation   Relevant Medications   lactulose (CHRONULAC) 10 GM/15ML solution   Chronic nausea   Relevant Medications   ondansetron (ZOFRAN-ODT) 8 MG disintegrating tablet   Other Visit Diagnoses     Need for immunization against influenza    -  Primary   Relevant Orders  Flu Vaccine QUAD 54moIM (Fluarix, Fluzone & Alfiuria Quad PF) (Completed)   Pulmonary hypertension (HCC)       Relevant Medications   spironolactone (ALDACTONE) 25 MG tablet   sildenafil (REVATIO) 20 MG tablet   ezetimibe (ZETIA) 10 MG tablet   torsemide 40 MG TABS   losartan (COZAAR) 25 MG tablet   rosuvastatin (CRESTOR) 40 MG tablet   Vitamin D deficiency       Relevant Medications   Vitamin D, Ergocalciferol, (DRISDOL) 1.25 MG (50000 UNIT) CAPS capsule   Dependent edema       Relevant Medications   torsemide 40 MG TABS   Hyperlipidemia LDL goal <70       Relevant Medications   spironolactone (ALDACTONE) 25 MG tablet   sildenafil (REVATIO) 20 MG tablet   ezetimibe (ZETIA) 10 MG tablet   torsemide 40 MG TABS   losartan (COZAAR) 25 MG tablet   rosuvastatin (CRESTOR) 40 MG tablet   Gastroesophageal reflux disease without esophagitis       Relevant Medications   lactulose (CHRONULAC) 10 GM/15ML solution   loperamide (IMODIUM A-D) 2 MG tablet   pantoprazole (PROTONIX) 40 MG tablet   ondansetron (ZOFRAN-ODT) 8 MG disintegrating tablet   Functional diarrhea       Relevant Medications   loperamide (IMODIUM A-D) 2 MG tablet   Low TSH level       Relevant Medications   traZODone (DESYREL) 100 MG tablet   Urinary retention       Relevant Medications   tamsulosin (FLOMAX) 0.4 MG CAPS capsule       Meds ordered this encounter  Medications  . spironolactone (ALDACTONE) 25 MG tablet    Sig:  TAKE (1/2) TABLET DAILY.    Dispense:  45 tablet    Refill:  1    Order Specific Question:   Supervising Provider    Answer:   GCarlota Raspberry JEFFREY R [[id number]]  . sildenafil (REVATIO) 20 MG tablet    Sig: TAKE 5 TABLETS AS NEEDED.    Dispense:  90 tablet    Refill:  0    Order Specific Question:   Supervising Provider    Answer:   GCarlota Raspberry JEFFREY R [[id number]]  . methocarbamol (ROBAXIN) 750 MG tablet    Sig: TAKE 1 OR 2 TABLETS THREE TIMES A DAY AS NEEDED.    Dispense:  180 tablet    Refill:  5    Order Specific Question:   Supervising Provider    Answer:   GCarlota Raspberry JEFFREY R [[id number]]  . ezetimibe (ZETIA) 10 MG tablet    Sig: TAKE 1 TABLET EACH DAY.    Dispense:  90 tablet    Refill:  0    Order Specific Question:   Supervising Provider    Answer:   GCarlota Raspberry JEFFREY R [[id number]]  . DISCONTD: clopidogrel (PLAVIX) 75 MG tablet    Sig: TAKE 1 TABLET EACH DAY.    Dispense:  90 tablet    Refill:  0    Order Specific Question:   Supervising Provider    Answer:   GCarlota Raspberry JEFFREY R [[id number]]  . tamsulosin (FLOMAX) 0.4 MG CAPS capsule    Sig: Take 1 capsule (0.4 mg total) by mouth daily after breakfast.    Dispense:  90 capsule    Refill:  0    Order Specific Question:   Supervising Provider    Answer:   GCarlota Raspberry JEFFREY R [[id number]]  . triamcinolone (NASACORT AQ) 55 MCG/ACT AERO nasal inhaler  Sig: Place 1 spray into the nose daily.    Dispense:  1 each    Refill:  12    Order Specific Question:   Supervising Provider    Answer:   Carlota Raspberry, JEFFREY R [[id number]]  . lactulose (CHRONULAC) 10 GM/15ML solution    Sig: Take 15 mLs (10 g total) by mouth 2 (two) times daily as needed for mild constipation.    Dispense:  1892 mL    Refill:  0    Order Specific Question:   Supervising Provider    Answer:   Carlota Raspberry, JEFFREY R [[id number]]  . dapagliflozin propanediol (FARXIGA) 10 MG TABS tablet    Sig: Take 1 tablet (10 mg total) by mouth daily before breakfast.    Dispense:  30 tablet    Refill:  6    Order Specific  Question:   Supervising Provider    Answer:   Carlota Raspberry, JEFFREY R [[id number]]  . traZODone (DESYREL) 100 MG tablet    Sig: Take 1 tablet (100 mg total) by mouth at bedtime.    Dispense:  90 tablet    Refill:  0    Order Specific Question:   Supervising Provider    Answer:   Carlota Raspberry, JEFFREY R [[id number]]  . albuterol (VENTOLIN HFA) 108 (90 Base) MCG/ACT inhaler    Sig: Inhale 2 puffs into the lungs every 6 (six) hours as needed for wheezing or shortness of breath.    Dispense:  18 g    Refill:  5    Order Specific Question:   Supervising Provider    Answer:   Carlota Raspberry, JEFFREY R [[id number]]  . torsemide 40 MG TABS    Sig: Take 20 mg by mouth daily as needed. For swelling in legs    Dispense:  90 tablet    Refill:  3    Order Specific Question:   Supervising Provider    Answer:   Carlota Raspberry, JEFFREY R [[id number]]  . losartan (COZAAR) 25 MG tablet    Sig: Take 1 tablet (25 mg total) by mouth daily.    Dispense:  90 tablet    Refill:  3    Order Specific Question:   Supervising Provider    Answer:   Carlota Raspberry, JEFFREY R [[id number]]  . loperamide (IMODIUM A-D) 2 MG tablet    Sig: Take 1.5 tablets (3 mg total) by mouth 4 (four) times daily as needed for diarrhea or loose stools.    Dispense:  30 tablet    Refill:  2    Order Specific Question:   Supervising Provider    Answer:   Carlota Raspberry, JEFFREY R [[id number]]  . pantoprazole (PROTONIX) 40 MG tablet    Sig: Take 1 tablet (40 mg total) by mouth daily.    Dispense:  90 tablet    Refill:  1    Order Specific Question:   Supervising Provider    Answer:   Carlota Raspberry, JEFFREY R [[id number]]  . rosuvastatin (CRESTOR) 40 MG tablet    Sig: Take 1 tablet (40 mg total) by mouth every evening.    Dispense:  30 tablet    Refill:  2    Pt needs to schedule an appt with our office at [phone] for future refills.    Order Specific Question:   Supervising Provider    Answer:   Carlota Raspberry, JEFFREY R [[id number]]  . Vitamin D, Ergocalciferol, (DRISDOL) 1.25 MG (50000 UNIT) CAPS capsule    Sig: Take 1  capsule (50,000 Units total) by mouth  every 7 (seven) days.    Dispense:  12 capsule    Refill:  1    Order Specific Question:   Supervising Provider    Answer:   Carlota Raspberry, JEFFREY R [[id number]]  . ondansetron (ZOFRAN-ODT) 8 MG disintegrating tablet    Sig: Take 1 tablet (8 mg total) by mouth every 8 (eight) hours as needed for nausea or vomiting.    Dispense:  90 tablet    Refill:  1    Order Specific Question:   Supervising Provider    Answer:   Carlota Raspberry, JEFFREY R [[id number]]    Follow-up: No follow-ups on file.   PLAN Refill meds as above Flu shot given Follow up q48moPatient encouraged to call clinic with any questions, comments, or concerns.  RMaximiano Coss NP

## 2021-06-04 ENCOUNTER — Other Ambulatory Visit: Payer: Self-pay | Admitting: Registered Nurse

## 2021-06-04 DIAGNOSIS — I272 Pulmonary hypertension, unspecified: Secondary | ICD-10-CM

## 2021-06-18 DIAGNOSIS — M21371 Foot drop, right foot: Secondary | ICD-10-CM | POA: Diagnosis not present

## 2021-06-23 ENCOUNTER — Ambulatory Visit: Payer: Self-pay | Admitting: Registered Nurse

## 2021-06-24 ENCOUNTER — Telehealth: Payer: Self-pay | Admitting: Internal Medicine

## 2021-06-24 NOTE — Telephone Encounter (Signed)
Pt scheduled 07/21/2021 with Dr Caryl Comes.

## 2021-06-24 NOTE — Telephone Encounter (Signed)
Patient states he is calling to reschedule his procedure.

## 2021-06-30 ENCOUNTER — Telehealth: Payer: Self-pay

## 2021-06-30 NOTE — Telephone Encounter (Signed)
Noted  

## 2021-06-30 NOTE — Telephone Encounter (Signed)
[facility] alert for NSVT with successful ATP therapy. Event occurred 12/19 @ 01:47, EGM shows sustained VT, rate 190, falling in to the VT-2 zone, ATP delivered x2 converting to regular AS/VS. There are two SVT events, longest duration 36sec, HR's 150's. Device reached ERI 8/4, gen change was scheduled 10/5. Pt. called to reschedule his procedure, now scheduled 1/9.  Patient repots he was leaving someone's apartment, felt dizzy, nauseated and short of breath, then resolved. Denies chest pain or palpitations. Denies any further issues today, reports feeling fine today. Does report swelling in legs are more than normal. Patient is not compliant with medications (states he is on 19 and it is difficult to keep track of them).   [place] Driving restrictions x6 months reviewed with verbal understanding. Shock plan reviewed.   Patient expresses concern about his device not lasting until his gen change. Advised I will forward to Dr. Caryl Comes for review recommendations.

## 2021-07-03 ENCOUNTER — Ambulatory Visit (INDEPENDENT_AMBULATORY_CARE_PROVIDER_SITE_OTHER): Payer: BC Managed Care – PPO

## 2021-07-03 DIAGNOSIS — I255 Ischemic cardiomyopathy: Secondary | ICD-10-CM | POA: Diagnosis not present

## 2021-07-04 LAB — CUP PACEART REMOTE DEVICE CHECK
Battery Remaining Longevity: 0 mo
Battery Voltage: 2.57 V
Brady Statistic AP VP Percent: 1 %
Brady Statistic AP VS Percent: 1 %
Brady Statistic AS VP Percent: 1 %
Brady Statistic AS VS Percent: 97 %
Brady Statistic RA Percent Paced: 1 %
Brady Statistic RV Percent Paced: 1 %
Date Time Interrogation Session: 20221223083456
HighPow Impedance: 60 Ohm
HighPow Impedance: 60 Ohm
Implantable Lead Implant Date: 20120201
Implantable Lead Implant Date: 20120201
Implantable Lead Location: 753859
Implantable Lead Location: 753860
Implantable Pulse Generator Implant Date: 20120201
Lead Channel Impedance Value: 380 Ohm
Lead Channel Impedance Value: 390 Ohm
Lead Channel Pacing Threshold Amplitude: 0.75 V
Lead Channel Pacing Threshold Amplitude: 1.75 V
Lead Channel Pacing Threshold Pulse Width: 0.5 ms
Lead Channel Pacing Threshold Pulse Width: 0.5 ms
Lead Channel Sensing Intrinsic Amplitude: 12 mV
Lead Channel Sensing Intrinsic Amplitude: 3.1 mV
Lead Channel Setting Pacing Amplitude: 2 V
Lead Channel Setting Pacing Amplitude: 3 V
Lead Channel Setting Pacing Pulse Width: 0.5 ms
Lead Channel Setting Sensing Sensitivity: 0.5 mV
Pulse Gen Serial Number: 815096

## 2021-07-10 ENCOUNTER — Other Ambulatory Visit: Payer: Self-pay | Admitting: Registered Nurse

## 2021-07-10 DIAGNOSIS — I272 Pulmonary hypertension, unspecified: Secondary | ICD-10-CM

## 2021-07-15 NOTE — Progress Notes (Signed)
Remote ICD transmission.   

## 2021-07-17 ENCOUNTER — Ambulatory Visit (INDEPENDENT_AMBULATORY_CARE_PROVIDER_SITE_OTHER): Payer: Self-pay

## 2021-07-17 DIAGNOSIS — I255 Ischemic cardiomyopathy: Secondary | ICD-10-CM

## 2021-07-17 LAB — CUP PACEART REMOTE DEVICE CHECK
Battery Remaining Longevity: 0 mo
Battery Voltage: 2.57 V
Brady Statistic AP VP Percent: 1 %
Brady Statistic AP VS Percent: 1.1 %
Brady Statistic AS VP Percent: 1 %
Brady Statistic AS VS Percent: 97 %
Brady Statistic RA Percent Paced: 1 %
Brady Statistic RV Percent Paced: 1 %
Date Time Interrogation Session: 20230105083925
HighPow Impedance: 72 Ohm
HighPow Impedance: 72 Ohm
Implantable Lead Implant Date: 20120201
Implantable Lead Implant Date: 20120201
Implantable Lead Location: 753859
Implantable Lead Location: 753860
Implantable Pulse Generator Implant Date: 20120201
Lead Channel Impedance Value: 380 Ohm
Lead Channel Impedance Value: 410 Ohm
Lead Channel Pacing Threshold Amplitude: 0.75 V
Lead Channel Pacing Threshold Amplitude: 1.75 V
Lead Channel Pacing Threshold Pulse Width: 0.5 ms
Lead Channel Pacing Threshold Pulse Width: 0.5 ms
Lead Channel Sensing Intrinsic Amplitude: 12 mV
Lead Channel Sensing Intrinsic Amplitude: 3.4 mV
Lead Channel Setting Pacing Amplitude: 2 V
Lead Channel Setting Pacing Amplitude: 3 V
Lead Channel Setting Pacing Pulse Width: 0.5 ms
Lead Channel Setting Sensing Sensitivity: 0.5 mV
Pulse Gen Serial Number: 815096

## 2021-07-18 ENCOUNTER — Telehealth: Payer: Self-pay | Admitting: Internal Medicine

## 2021-07-18 NOTE — Telephone Encounter (Signed)
° °  Pt wanted to know if he needs blood work before his appt with Dr. Caryl Comes on monday

## 2021-07-18 NOTE — Telephone Encounter (Signed)
Attempted phone call to pt.  Unable to leave voicemail as voicemail is full.

## 2021-07-21 ENCOUNTER — Other Ambulatory Visit: Payer: Self-pay

## 2021-07-21 ENCOUNTER — Ambulatory Visit: Payer: Self-pay | Admitting: Internal Medicine

## 2021-07-21 ENCOUNTER — Encounter: Payer: Self-pay | Admitting: Internal Medicine

## 2021-07-21 VITALS — BP 114/64 | HR 68 | Ht 72.0 in | Wt 150.0 lb

## 2021-07-21 DIAGNOSIS — I472 Ventricular tachycardia, unspecified: Secondary | ICD-10-CM

## 2021-07-21 DIAGNOSIS — Z95810 Presence of automatic (implantable) cardiac defibrillator: Secondary | ICD-10-CM | POA: Diagnosis not present

## 2021-07-21 DIAGNOSIS — Z01812 Encounter for preprocedural laboratory examination: Secondary | ICD-10-CM

## 2021-07-21 DIAGNOSIS — I5022 Chronic systolic (congestive) heart failure: Secondary | ICD-10-CM | POA: Diagnosis not present

## 2021-07-21 DIAGNOSIS — I255 Ischemic cardiomyopathy: Secondary | ICD-10-CM | POA: Diagnosis not present

## 2021-07-21 DIAGNOSIS — Z01818 Encounter for other preprocedural examination: Secondary | ICD-10-CM | POA: Diagnosis not present

## 2021-07-21 NOTE — Telephone Encounter (Signed)
Pt completed OV with Dr Caryl Comes today 07/21/2021

## 2021-07-21 NOTE — H&P (View-Only) (Signed)
° ° ° ° °Patient Care Team: °Morrow, Richard, NP as PCP - General (Adult Health Nurse Practitioner) °Bensimhon, Daniel R, MD as PCP - Advanced Heart Failure (Cardiology) °Klohe Lovering C, MD as PCP - Electrophysiology (Cardiology) °Govert, Joseph Alan, MD as Referring Physician (Pulmonary Disease) °Plovsky, Gerald, MD as Consulting Physician (Psychiatry) °Cece Milhouse C, MD as Consulting Physician (Cardiology) °Bensimhon, Daniel R, MD as Consulting Physician (Cardiology) °Wert, Michael B, MD as Consulting Physician (Pulmonary Disease) °Sood, Vineet, MD as Consulting Physician (Pulmonary Disease) °Rogers, Joseph, MD as Referring Physician (Internal Medicine) ° ° °HPI ° °[name] is a 61 y.o. male °SEEN in follow-up for an ICD implanted 2012 for primary prevention in the setting of ischemic cardiomyopathy and ejection fraction of 25% complex ventricular ectopy and not amenable to revascularization. ° °Previously seen here and then at [facility]; most recent note at [facility] 11/20.  Most recent note from Dr. DB ° °He has a brace on his R leg. He has gained weight r He associates the weight gain to increased caloric intake. ° °He is not taking his lisinopril and losartan because he feels his blood pressure is well-controlled. Between the two, he is more open to starting lisinopril again. He takes the torsemide but increased it to twice a day. He continues to notice bilateral LE edema. ° °He has not smoked in the past 12 hrs and is getting a patch to work on quitting smoking.  ° °The patient denies chest pain,  nocturnal dyspnea, or orthopnea. There have been no palpitations, lightheadedness or syncope. Complains of weight gain and LE edema.  Chronic shortness of breath °\ °Significant anxiety related to his son whom he describes as autistic and struggling over [place] ° °Records and Results Reviewed  ° °DATE TEST EF   °8/19 Echo  30-35 %   °1/21 Echo 40-45  % LV hypertrophy  °     ° °Date Cr K Hgb  °8/22 1.47 4.3  12.7  °10/22 1.10 4.0 13.3  ° ° °Past Medical History:  °Diagnosis Date  ° Anxiety   ° Automatic implantable cardiac defibrillator [employer]   ° Analyze ST  ° Benign prostatic hypertrophy   ° Benzodiazepine dependence (HCC)   ° chronic  ° CAD (coronary artery disease)   ° Last cath 2/12. 3-v CAD. Failed PCI of distal RCAc  CATH [facility] 4/13 with DES to  LAD X2  ° CHF (congestive heart failure) (HCC)   ° Chronic back pain   ° lumbar stenosis  ° Chronic systolic heart failure (HCC)   ° EF 20-25%. s/p ST. Jude ICD  ° Depression   ° DJD (degenerative joint disease)   ° History of testicular cancer   ° Ischemic cardiomyopathy   ° Myocardial infarction (HCC)   ° Narcotic dependence (HCC)   ° chronic  ° OSA on CPAP   ° Polycythemia, secondary   ° S/p hematology consultation for polycythema on 01/06/18.  S/p bone marrow biopsy on 12/31/17 that was negative for myeloproliferative disorder.  Feels secondary to sleep apnea, active smoking, testosterone supplementation, nocturnal hypoxia  ° TIA (transient ischemic attack)   ° Vitamin B12 deficiency 07/22/2016  ° ° °Past Surgical History:  °Procedure Laterality Date  ° ASD REPAIR, SINUS VENOSUS    ° CARDIAC DEFIBRILLATOR PLACEMENT  08/2010  ° HERNIA REPAIR    ° LEFT HEART CATHETERIZATION WITH CORONARY ANGIOGRAM N/A 06/14/2014  ° Procedure: LEFT HEART CATHETERIZATION WITH CORONARY ANGIOGRAM;  Surgeon: Daniel R Bensimhon, MD;    Location: [facility] CATH [facility];  Service: Cardiovascular;  Laterality: N/A;  ° TESTICLE SURGERY    ° testicular cancer surgery left testicle removed  ° ° °Current Meds  °Medication Sig  ° albuterol (VENTOLIN HFA) 108 (90 Base) MCG/ACT inhaler Inhale 2 puffs into the lungs every 6 (six) hours as needed for wheezing or shortness of breath.  ° ALPRAZolam (XANAX) 1 MG tablet Take 1 mg by mouth 4 (four) times daily.   ° aspirin 325 MG tablet Take 325 mg by mouth daily.  ° carvedilol (COREG) 25 MG tablet Take 25 mg by mouth 2 (two) times daily with a meal.  ° clopidogrel (PLAVIX)  75 MG tablet TAKE ONE TABLET BY MOUTH DAILY  ° cyanocobalamin (,VITAMIN B-12,) 1000 MCG/ML injection Inject 1 mL (1,000 mcg total) into the muscle every 30 (thirty) days.  ° diazepam (VALIUM) 10 MG tablet Take 20 mg by mouth every 12 (twelve) hours as needed for anxiety.   ° doxycycline (VIBRAMYCIN) 100 MG capsule Take 1 capsule (100 mg total) by mouth 2 (two) times daily.  ° ezetimibe (ZETIA) 10 MG tablet TAKE 1 TABLET EACH DAY.  ° furosemide (LASIX) 20 MG tablet Take 1 tablet (20 mg total) by mouth daily.  ° ibuprofen (ADVIL,MOTRIN) 200 MG tablet Take 800 mg by mouth every 6 (six) hours as needed for moderate pain.  ° lisinopril (PRINIVIL,ZESTRIL) 5 MG tablet Take 5 mg by mouth daily.  ° loperamide (IMODIUM A-D) 2 MG tablet Take 1.5 tablets (3 mg total) by mouth 4 (four) times daily as needed for diarrhea or loose stools.  ° methadone (METHADOSE) 40 MG disintegrating tablet Take 200 mg by mouth daily. [facility]  ° methocarbamol (ROBAXIN) 750 MG tablet TAKE 1 OR 2 TABLETS THREE TIMES A DAY AS NEEDED.  ° naproxen (NAPROSYN) 500 MG tablet Take 1 tablet (500 mg total) by mouth 2 (two) times daily as needed for moderate pain.  ° nitroGLYCERIN (NITROLINGUAL) 0.4 MG/SPRAY spray Place 2 sprays under the tongue every 5 (five) minutes x 3 doses as needed for chest pain.   ° ondansetron (ZOFRAN-ODT) 8 MG disintegrating tablet Take 1 tablet (8 mg total) by mouth every 8 (eight) hours as needed for nausea or vomiting.  ° oxymetazoline (AFRIN) 0.05 % nasal spray Place 1 spray into both nostrils 2 (two) times daily.  ° polyethylene glycol powder (GLYCOLAX/MIRALAX) 17 GM/SCOOP powder Take 17 g by mouth 2 (two) times daily as needed for mild constipation.  ° rosuvastatin (CRESTOR) 40 MG tablet Take 1 tablet (40 mg total) by mouth every evening.  ° sildenafil (REVATIO) 20 MG tablet TAKE FIVE TABLETS BY MOUTH AS NEEDED  ° sildenafil (VIAGRA) 100 MG tablet Take 0.5 tablets (50 mg total) by mouth daily as needed for  erectile dysfunction.  ° spironolactone (ALDACTONE) 25 MG tablet TAKE (1/2) TABLET DAILY.  ° sucralfate (CARAFATE) 1 g tablet Take 1 tablet (1 g total) by mouth 4 (four) times daily -  with meals and at bedtime.  ° sulfamethoxazole-trimethoprim (BACTRIM DS) 800-160 MG tablet Take 1 tablet by mouth 2 (two) times daily.  ° tamsulosin (FLOMAX) 0.4 MG CAPS capsule Take 1 capsule (0.4 mg total) by mouth daily after breakfast.  ° TESTOSTERONE CYPIONATE IM Inject into the muscle every 14 (fourteen) days.  ° torsemide (DEMADEX) 20 MG tablet TAKE ONE TABLET DAILY AS NEEDED FOR SWELLING IN LEGS.  ° triamcinolone (NASACORT AQ) 55 MCG/ACT AERO nasal inhaler Place 1 spray into the nose daily.  °   Vitamin D, Ergocalciferol, (DRISDOL) 1.25 MG (50000 UNIT) CAPS capsule Take 1 capsule (50,000 Units total) by mouth every 7 (seven) days.  ° zolpidem (AMBIEN) 10 MG tablet Take 10 mg by mouth at bedtime.   ° ° °Allergies  °Allergen Reactions  ° Darvocet [Propoxyphene N-Acetaminophen] Anaphylaxis  °  Throat closes  ° ° ° ° °Review of Systems negative except from HPI and PMH ° °Physical Exam °BP 114/64    Pulse 68    Ht 6' (1.829 m)    Wt 150 lb (68 kg)    SpO2 98%    BMI 20.34 kg/m²  °Well developed and well nourished in no acute distress °HENT normal °Neck supple with JVP-flat °Clear °Device pocket well healed; without hematoma or erythema.  There is no tethering  °Regular rate and rhythm, no gallop No murmur °Abd-soft with active BS °No Clubbing cyanosis mild edema °Skin-warm and dry °A & Oriented  Grossly normal sensory and motor function wearing leg brace ° °ECG sinus at 68 °Intervals 15/11/47 ° °Assessment and  Plan ° °Ischemic CM ° °ICD St Jude  ° °Ventricular tachycardia ° °The patient has an ischemic cardiomyopathy.  He has not followed up with his cardiologist either [facility] or here.  His device reached ERI 8/22 and has foregone at least for the opportunity to have it changed.  He had his first episode of treated tachycardia 12/22,  VT that was terminated with antitachycardia pacing. ° °I am reluctant about changing out his defibrillator and that I think it is the least valuable therapy in general in patients with ischemic cardiomyopathy, but the benefits of medications are far superior and that in his choosing to forego medical therapy and medical follow-up he is making a poor medical choice and that his desire "not to die because of my son "is best addressed by medical therapy and to a lesser degree by the ICD. ° °Yet, he had his first episode of ventricular tachycardia about 1 month ago and so we will proceed and he is agreed to follow-up with his cardiology care. ° °We have reviewed the benefits and risks of generator replacement.  These include but are not limited to lead fracture and infection.  The patient understands, agrees and is willing to proceed.   ° ° ° °I,Mykaella Javier,acting as a scribe for Yordi Krager, MD.,have documented all relevant documentation on the behalf of Harue Pribble, MD,as directed by  Derrel Moore, MD while in the presence of Ilaisaane Marts, MD. ° °I, Bryah Ocheltree, MD, have reviewed all documentation for this visit. The documentation on 07/21/21 for the exam, diagnosis, procedures, and orders are all accurate and complete.  ° °

## 2021-07-21 NOTE — Patient Instructions (Signed)
Medication Instructions: Your physician recommends that you continue on your current medications as directed. Please refer to the Current Medication list given to you today.  *If you need a refill on your cardiac medications before your next appointment, please call your pharmacy*   Lab Work: CBC and BMET today  If you have labs (blood work) drawn today and your tests are completely normal, you will receive your results only by: [place] (if you have MyChart) OR A paper copy in the mail If you have any lab test that is abnormal or we need to change your treatment, we will call you to review the results.   Testing/Procedures: ICD Generator Change Your physician has recommended that you have a defibrillator inserted. An implantable cardioverter defibrillator (ICD) is a small device that is placed in your chest or, in rare cases, your abdomen. This device uses electrical pulses or shocks to help control life-threatening, irregular heartbeats that could lead the heart to suddenly stop beating (sudden cardiac arrest). Leads are attached to the ICD that goes into your heart. This is done in the hospital and usually requires an overnight stay. Please see the instruction sheet given to you today for more information.     Follow-Up: At [facility] you and your health needs are our priority.  As part of our continuing mission to provide you with exceptional heart care, we have created designated Provider Care Teams.  These Care Teams include your primary Cardiologist (physician) and Advanced Practice Providers (APPs -  Physician Assistants and Nurse Practitioners) who all work together to provide you with the care you need, when you need it.  We recommend signing up for the patient portal called "MyChart".  Sign up information is provided on this After Visit Summary.  MyChart is used to connect with patients for Virtual Visits (Telemedicine).  Patients are able to view lab/test results,  encounter notes, upcoming appointments, etc.  Non-urgent messages can be sent to your provider as well.   To learn more about what you can do with MyChart, go to [email or web address].    Your next appointment:   To be scheduled

## 2021-07-21 NOTE — Progress Notes (Signed)
Patient Care Team: [name], NP as PCP - General (Adult Health Nurse Practitioner) Haroldine Laws, Shaune Pascal, MD as PCP - Advanced Heart Failure (Cardiology) Deboraha Sprang, MD as PCP - Electrophysiology (Cardiology) Govert, Natale Lay, MD as Referring Physician (Pulmonary Disease) Norma Fredrickson, MD as Consulting Physician (Psychiatry) Deboraha Sprang, MD as Consulting Physician (Cardiology) Bensimhon, Shaune Pascal, MD as Consulting Physician (Cardiology) Tanda Rockers, MD as Consulting Physician (Pulmonary Disease) Chesley Mires, MD as Consulting Physician (Pulmonary Disease) Charlynn Grimes, MD as Referring Physician (Internal Medicine)   HPI  [name] is a 61 y.o. male SEEN in follow-up for an ICD implanted 2012 for primary prevention in the setting of ischemic cardiomyopathy and ejection fraction of 25% complex ventricular ectopy and not amenable to revascularization.  Previously seen here and then at [facility]; most recent note at [facility] 11/20.  Most recent note from Dr. Reine Just  He has a brace on his R leg. He has gained weight r He associates the weight gain to increased caloric intake.  He is not taking his lisinopril and losartan because he feels his blood pressure is well-controlled. Between the two, he is more open to starting lisinopril again. He takes the torsemide but increased it to twice a day. He continues to notice bilateral LE edema.  He has not smoked in the past 12 hrs and is getting a patch to work on quitting smoking.   The patient denies chest pain,  nocturnal dyspnea, or orthopnea. There have been no palpitations, lightheadedness or syncope. Complains of weight gain and LE edema.  Chronic shortness of breath \ Significant anxiety related to his son whom he describes as autistic and struggling over [facility] and Results Reviewed   DATE TEST EF   8/19 Echo  30-35 %   1/21 Echo 40-45  % LV hypertrophy        Date Cr K Hgb  8/22 1.47 4.3  12.7  10/22 1.10 4.0 13.3    Past Medical History:  Diagnosis Date   Anxiety    Automatic implantable cardiac defibrillator [employer] Judes    Analyze ST   Benign prostatic hypertrophy    Benzodiazepine dependence (HCC)    chronic   CAD (coronary artery disease)    Last cath 2/12. 3-v CAD. Failed PCI of distal RCAc  CATH [facility] 4/13 with DES to  LAD X2   CHF (congestive heart failure) (HCC)    Chronic back pain    lumbar stenosis   Chronic systolic heart failure (HCC)    EF 20-25%. s/p ST. Jude ICD   Depression    DJD (degenerative joint disease)    History of testicular cancer    Ischemic cardiomyopathy    Myocardial infarction [facility])    Narcotic dependence ([place])    chronic   OSA on CPAP    Polycythemia, secondary    S/p hematology consultation for polycythema on 01/06/18.  S/p bone marrow biopsy on 12/31/17 that was negative for myeloproliferative disorder.  Feels secondary to sleep apnea, active smoking, testosterone supplementation, nocturnal hypoxia   TIA (transient ischemic attack)    Vitamin B12 deficiency 07/22/2016    Past Surgical History:  Procedure Laterality Date   ASD REPAIR, SINUS VENOSUS     CARDIAC DEFIBRILLATOR PLACEMENT  08/2010   HERNIA REPAIR     LEFT HEART CATHETERIZATION WITH CORONARY ANGIOGRAM N/A 06/14/2014   Procedure: LEFT HEART CATHETERIZATION WITH CORONARY ANGIOGRAM;  Surgeon: Jolaine Artist, MD;  Location: [place] CATH LAB;  Service: Cardiovascular;  Laterality: N/A;   TESTICLE SURGERY     testicular cancer surgery left testicle removed    Current Meds  Medication Sig   albuterol (VENTOLIN HFA) 108 (90 Base) MCG/ACT inhaler Inhale 2 puffs into the lungs every 6 (six) hours as needed for wheezing or shortness of breath.   ALPRAZolam (XANAX) 1 MG tablet Take 1 mg by mouth 4 (four) times daily.    aspirin 325 MG tablet Take 325 mg by mouth daily.   carvedilol (COREG) 25 MG tablet Take 25 mg by mouth 2 (two) times daily with a meal.   clopidogrel (PLAVIX)  75 MG tablet TAKE ONE TABLET BY MOUTH DAILY   cyanocobalamin (,VITAMIN B-12,) 1000 MCG/ML injection Inject 1 mL (1,000 mcg total) into the muscle every 30 (thirty) days.   diazepam (VALIUM) 10 MG tablet Take 20 mg by mouth every 12 (twelve) hours as needed for anxiety.    doxycycline (VIBRAMYCIN) 100 MG capsule Take 1 capsule (100 mg total) by mouth 2 (two) times daily.   ezetimibe (ZETIA) 10 MG tablet TAKE 1 TABLET EACH DAY.   furosemide (LASIX) 20 MG tablet Take 1 tablet (20 mg total) by mouth daily.   ibuprofen (ADVIL,MOTRIN) 200 MG tablet Take 800 mg by mouth every 6 (six) hours as needed for moderate pain.   lisinopril (PRINIVIL,ZESTRIL) 5 MG tablet Take 5 mg by mouth daily.   loperamide (IMODIUM A-D) 2 MG tablet Take 1.5 tablets (3 mg total) by mouth 4 (four) times daily as needed for diarrhea or loose stools.   methadone (METHADOSE) 40 MG disintegrating tablet Take 200 mg by mouth daily. [facility]   methocarbamol (ROBAXIN) 750 MG tablet TAKE 1 OR 2 TABLETS THREE TIMES A DAY AS NEEDED.   naproxen (NAPROSYN) 500 MG tablet Take 1 tablet (500 mg total) by mouth 2 (two) times daily as needed for moderate pain.   nitroGLYCERIN (NITROLINGUAL) 0.4 MG/SPRAY spray Place 2 sprays under the tongue every 5 (five) minutes x 3 doses as needed for chest pain.    ondansetron (ZOFRAN-ODT) 8 MG disintegrating tablet Take 1 tablet (8 mg total) by mouth every 8 (eight) hours as needed for nausea or vomiting.   oxymetazoline (AFRIN) 0.05 % nasal spray Place 1 spray into both nostrils 2 (two) times daily.   polyethylene glycol powder (GLYCOLAX/MIRALAX) 17 GM/SCOOP powder Take 17 g by mouth 2 (two) times daily as needed for mild constipation.   rosuvastatin (CRESTOR) 40 MG tablet Take 1 tablet (40 mg total) by mouth every evening.   sildenafil (REVATIO) 20 MG tablet TAKE FIVE TABLETS BY MOUTH AS NEEDED   sildenafil (VIAGRA) 100 MG tablet Take 0.5 tablets (50 mg total) by mouth daily as needed for  erectile dysfunction.   spironolactone (ALDACTONE) 25 MG tablet TAKE (1/2) TABLET DAILY.   sucralfate (CARAFATE) 1 g tablet Take 1 tablet (1 g total) by mouth 4 (four) times daily -  with meals and at bedtime.   sulfamethoxazole-trimethoprim (BACTRIM DS) 800-160 MG tablet Take 1 tablet by mouth 2 (two) times daily.   tamsulosin (FLOMAX) 0.4 MG CAPS capsule Take 1 capsule (0.4 mg total) by mouth daily after breakfast.   TESTOSTERONE CYPIONATE IM Inject into the muscle every 14 (fourteen) days.   torsemide (DEMADEX) 20 MG tablet TAKE ONE TABLET DAILY AS NEEDED FOR SWELLING IN LEGS.   triamcinolone (NASACORT AQ) 55 MCG/ACT AERO nasal inhaler Place 1 spray into the nose daily.  Vitamin D, Ergocalciferol, (DRISDOL) 1.25 MG (50000 UNIT) CAPS capsule Take 1 capsule (50,000 Units total) by mouth every 7 (seven) days.   zolpidem (AMBIEN) 10 MG tablet Take 10 mg by mouth at bedtime.     Allergies  Allergen Reactions   Darvocet [Propoxyphene N-Acetaminophen] Anaphylaxis    Throat closes      Review of Systems negative except from HPI and PMH  Physical Exam BP 114/64    Pulse 68    Ht 6' (1.829 m)    Wt 150 lb (68 kg)    SpO2 98%    BMI 20.34 kg/m  Well developed and well nourished in no acute distress HENT normal Neck supple with JVP-flat Clear Device pocket well healed; without hematoma or erythema.  There is no tethering  Regular rate and rhythm, no gallop No murmur Abd-soft with active BS No Clubbing cyanosis mild edema Skin-warm and dry A & Oriented  Grossly normal sensory and motor function wearing leg brace  ECG sinus at 68 Intervals 15/11/47  Assessment and  Plan  Ischemic CM  ICD St Jude   Ventricular tachycardia  The patient has an ischemic cardiomyopathy.  He has not followed up with his cardiologist either [facility] or here.  His device reached ERI 8/22 and has foregone at least for the opportunity to have it changed.  He had his first episode of treated tachycardia 12/22,  VT that was terminated with antitachycardia pacing.  I am reluctant about changing out his defibrillator and that I think it is the least valuable therapy in general in patients with ischemic cardiomyopathy, but the benefits of medications are far superior and that in his choosing to forego medical therapy and medical follow-up he is making a poor medical choice and that his desire "not to die because of my son "is best addressed by medical therapy and to a lesser degree by the ICD.  Yet, he had his first episode of ventricular tachycardia about 1 month ago and so we will proceed and he is agreed to follow-up with his cardiology care.  We have reviewed the benefits and risks of generator replacement.  These include but are not limited to lead fracture and infection.  The patient understands, agrees and is willing to proceed.      I,Mykaella Javier,acting as a scribe for Virl Axe, MD.,have documented all relevant documentation on the behalf of Virl Axe, MD,as directed by  Virl Axe, MD while in the presence of Virl Axe, MD.  I, Virl Axe, MD, have reviewed all documentation for this visit. The documentation on 07/21/21 for the exam, diagnosis, procedures, and orders are all accurate and complete.

## 2021-07-22 LAB — CBC
Hematocrit: 47.1 % (ref 37.5–51.0)
Hemoglobin: 16.7 g/dL (ref 13.0–17.7)
MCH: 33.5 pg — ABNORMAL HIGH (ref 26.6–33.0)
MCHC: 35.5 g/dL (ref 31.5–35.7)
MCV: 94 fL (ref 79–97)
Platelets: 145 10*3/uL — ABNORMAL LOW (ref 150–450)
RBC: 4.99 x10E6/uL (ref 4.14–5.80)
RDW: 12.7 % (ref 11.6–15.4)
WBC: 4.7 10*3/uL (ref 3.4–10.8)

## 2021-07-22 LAB — BASIC METABOLIC PANEL
BUN/Creatinine Ratio: 16 (ref 10–24)
BUN: 23 mg/dL (ref 8–27)
CO2: 23 mmol/L (ref 20–29)
Calcium: 9.3 mg/dL (ref 8.6–10.2)
Chloride: 96 mmol/L (ref 96–106)
Creatinine, Ser: 1.43 mg/dL — ABNORMAL HIGH (ref 0.76–1.27)
Glucose: 93 mg/dL (ref 70–99)
Potassium: 4.1 mmol/L (ref 3.5–5.2)
Sodium: 138 mmol/L (ref 134–144)
eGFR: 56 mL/min/{1.73_m2} — ABNORMAL LOW (ref >59)

## 2021-07-28 NOTE — Progress Notes (Signed)
Remote ICD transmission.   

## 2021-07-29 NOTE — Addendum Note (Signed)
Addended by: Douglass Rivers D on: 07/29/2021 11:16 AM   Modules accepted: Level of Service

## 2021-08-01 NOTE — Telephone Encounter (Signed)
Spoke with pt and advised of arrival time change for device generator change.  Pt is now to arrive at 11am on 08/11/2021.  Pt verbalizes understanding.  Gen change letter updated and sent to pt on MyChart.  See letter for complete details.

## 2021-08-02 NOTE — Progress Notes (Signed)
Established Patient Office Visit  Subjective:  Patient ID: [name], male    DOB: 1960-09-20  Age: 61 y.o. MRN: [id number]  CC:  Chief Complaint  Patient presents with   Elbow Pain    Patient states he has been having some more elbow pain and some swelling.    HPI [name] presents for elbow pain, weakness  Elbow pain Ongoing. Hx of frequent traumatic olecranon bursae  Have been drained in the past and found to be nonseptic, blood and serous fluid entirely. Has happened again Denies falls, notes he will occasionally bump into walls in his home Tried to keep compression on them  Weakness Generally weak, weight loss, loss of muscle tone Notes that this limits his activity, which he feels makes things worse No changes to diet or exercise. Appetite decreased. States he is up to date on visits with cardiology, respiratory, and GI who have not found any cause.  Past Medical History:  Diagnosis Date   Anxiety    Automatic implantable cardiac defibrillator [employer]    Analyze ST   Benign prostatic hypertrophy    Benzodiazepine dependence (HCC)    chronic   CAD (coronary artery disease)    Last cath 2/12. 3-v CAD. Failed PCI of distal RCAc  CATH [facility] 4/13 with DES to  LAD X2   CHF (congestive heart failure) (HCC)    Chronic back pain    lumbar stenosis   Chronic systolic heart failure (HCC)    EF 20-25%. s/p ST[employer] ICD   Depression    DJD (degenerative joint disease)    History of testicular cancer    Ischemic cardiomyopathy    Myocardial infarction [facility])    Narcotic dependence ([place])    chronic   OSA on CPAP    Polycythemia, secondary    S/p hematology consultation for polycythema on 01/06/18.  S/p bone marrow biopsy on 12/31/17 that was negative for myeloproliferative disorder.  Feels secondary to sleep apnea, active smoking, testosterone supplementation, nocturnal hypoxia   TIA (transient ischemic attack)    Vitamin B12 deficiency 07/22/2016    Past  Surgical History:  Procedure Laterality Date   ASD REPAIR, SINUS VENOSUS     CARDIAC DEFIBRILLATOR PLACEMENT  08/2010   HERNIA REPAIR     LEFT HEART CATHETERIZATION WITH CORONARY ANGIOGRAM N/A 06/14/2014   Procedure: LEFT HEART CATHETERIZATION WITH CORONARY ANGIOGRAM;  Surgeon: Jolaine Artist, MD;  [facility]: [facility];  Service: Cardiovascular;  Laterality: N/A;   TESTICLE SURGERY     testicular cancer surgery left testicle removed    Family History  Problem Relation Age of Onset   Heart disease Father 70       Died of MI   Cancer Mother     Social History   Socioeconomic History   Marital status: Divorced    Spouse name: Not on file   Number of children: 4   Years of education: Not on file   Highest education level: Not on file  Occupational History   Not on file  Tobacco Use   Smoking status: Every Day    Packs/day: 0.50    Years: 29.00    Pack years: 14.50    Types: Cigarettes, E-cigarettes    Last attempt to quit: 07/14/2015    Years since quitting: 6.0   Smokeless tobacco: Never   Tobacco comments:    no cigarette in 4 days but yes to e-cig  Vaping Use   Vaping Use:  Every day   Start date: 07/14/2015  Substance and Sexual Activity   Alcohol use: No    Alcohol/week: 0.0 standard drinks   Drug use: Yes    Types: Marijuana    Comment: Urine showed THC   Sexual activity: Yes    Partners: Male  Other Topics Concern   Not on file  Social History Narrative   Marital status: married x 12 years; wife is severe alcoholic.      Children: 3 married daughters (23, 15, 41); 1 son (27yo); 3 grandchildren born in 2017      Lives: with wife, son      Left-handed   Caffeine: 3 drinks per day   Social Determinants of Health   Financial Resource Strain: Not on file  Food Insecurity: Not on file  Transportation Needs: Not on file  Physical Activity: Not on file  Stress: Not on file  Social Connections: Not on file  Intimate Partner Violence: Not on file     Outpatient Medications Prior to Visit  Medication Sig Dispense Refill   ALPRAZolam (XANAX) 1 MG tablet Take 1 mg by mouth 4 (four) times daily.      carvedilol (COREG) 25 MG tablet Take 25 mg by mouth 2 (two) times daily with a meal.     diazepam (VALIUM) 10 MG tablet Take 20 mg by mouth every 12 (twelve) hours as needed for anxiety.   2   diclofenac (VOLTAREN) 75 MG EC tablet Take 1 tablet (75 mg total) by mouth 2 (two) times daily. (Patient not taking: Reported on 07/21/2021) 20 tablet 0   doxycycline (VIBRAMYCIN) 100 MG capsule Take 1 capsule (100 mg total) by mouth 2 (two) times daily. 14 capsule 0   erythromycin ophthalmic ointment Place 1 application into the right eye at bedtime. (Patient not taking: Reported on 07/21/2021) 3.5 g 1   ibuprofen (ADVIL,MOTRIN) 200 MG tablet Take 800 mg by mouth every 6 (six) hours as needed for moderate pain.     lisinopril (PRINIVIL,ZESTRIL) 5 MG tablet Take 5 mg by mouth daily.     methadone (METHADOSE) 40 MG disintegrating tablet Take 200 mg by mouth daily. [facility]     naproxen (NAPROSYN) 500 MG tablet Take 1 tablet (500 mg total) by mouth 2 (two) times daily as needed for moderate pain. 60 tablet 2   nitroGLYCERIN (NITROLINGUAL) 0.4 MG/SPRAY spray Place 2 sprays under the tongue every 5 (five) minutes x 3 doses as needed for chest pain.      oxymetazoline (AFRIN) 0.05 % nasal spray Place 1 spray into both nostrils 2 (two) times daily.     polyethylene glycol powder (GLYCOLAX/MIRALAX) 17 GM/SCOOP powder Take 17 g by mouth 2 (two) times daily as needed for mild constipation.     sucralfate (CARAFATE) 1 g tablet Take 1 tablet (1 g total) by mouth 4 (four) times daily -  with meals and at bedtime. 120 tablet 0   TESTOSTERONE CYPIONATE IM Inject into the muscle every 14 (fourteen) days.     zolpidem (AMBIEN) 10 MG tablet Take 10 mg by mouth at bedtime.      albuterol (VENTOLIN HFA) 108 (90 Base) MCG/ACT inhaler Inhale 2 puffs into the  lungs every 6 (six) hours as needed for wheezing or shortness of breath. 18 g 5   clopidogrel (PLAVIX) 75 MG tablet TAKE 1 TABLET EACH DAY. 90 tablet 0   cyanocobalamin (,VITAMIN B-12,) 1000 MCG/ML injection Inject 1 mL (1,000 mcg total) into  the muscle every 30 (thirty) days. 1 mL 11   dapagliflozin propanediol (FARXIGA) 10 MG TABS tablet Take 10 mg by mouth daily before breakfast. 30 tablet 6   ezetimibe (ZETIA) 10 MG tablet TAKE 1 TABLET EACH DAY. 90 tablet 0   lactulose (CHRONULAC) 10 GM/15ML solution Take 15 mLs (10 g total) by mouth 2 (two) times daily as needed for mild constipation. 1892 mL 0   loperamide (IMODIUM A-D) 2 MG tablet Take 1.5 tablets (3 mg total) by mouth 4 (four) times daily as needed for diarrhea or loose stools. 30 tablet 2   losartan (COZAAR) 25 MG tablet Take 1 tablet (25 mg total) by mouth daily. 90 tablet 3   methocarbamol (ROBAXIN) 750 MG tablet TAKE 1 OR 2 TABLETS THREE TIMES A DAY AS NEEDED. 180 tablet 5   ondansetron (ZOFRAN-ODT) 8 MG disintegrating tablet Take 1 tablet (8 mg total) by mouth every 8 (eight) hours as needed for nausea or vomiting. 90 tablet 1   pantoprazole (PROTONIX) 40 MG tablet Take 1 tablet (40 mg total) by mouth daily. 90 tablet 1   rosuvastatin (CRESTOR) 40 MG tablet Take 1 tablet (40 mg total) by mouth every evening. 30 tablet 2   sildenafil (REVATIO) 20 MG tablet TAKE 5 TABLETS AS NEEDED. 90 tablet 0   spironolactone (ALDACTONE) 25 MG tablet TAKE (1/2) TABLET DAILY. (Patient taking differently: 25 mg. TAKE (1/2) TABLET DAILY.) 45 tablet 1   sulfamethoxazole-trimethoprim (BACTRIM DS) 800-160 MG tablet Take 1 tablet by mouth 2 (two) times daily. 14 tablet 0   tamsulosin (FLOMAX) 0.4 MG CAPS capsule Take 1 capsule (0.4 mg total) by mouth daily after breakfast. 90 capsule 0   torsemide (DEMADEX) 20 MG tablet Take 1 tablet (20 mg total) by mouth daily as needed. For swelling in legs 90 tablet 2   traZODone (DESYREL) 100 MG tablet Take 1 tablet (100  mg total) by mouth at bedtime. 90 tablet 0   triamcinolone (NASACORT AQ) 55 MCG/ACT AERO nasal inhaler Place 1 spray into the nose daily. 1 Inhaler 12   Vitamin D, Ergocalciferol, (DRISDOL) 1.25 MG (50000 UNIT) CAPS capsule Take 1 capsule (50,000 Units total) by mouth every 7 (seven) days. 12 capsule 1   clonazePAM (KLONOPIN) 1 MG tablet Take 1 mg by mouth 3 (three) times daily as needed. (Patient not taking: No sig reported)     No facility-administered medications prior to visit.    Allergies  Allergen Reactions   Darvocet [Propoxyphene N-Acetaminophen] Anaphylaxis    Throat closes    ROS Review of Systems  Constitutional:  Positive for unexpected weight change.  HENT: Negative.    Eyes: Negative.   Respiratory: Negative.    Cardiovascular: Negative.   Gastrointestinal: Negative.   Genitourinary: Negative.   Musculoskeletal:  Positive for arthralgias.  Skin: Negative.   Neurological: Negative.   Psychiatric/Behavioral: Negative.    All other systems reviewed and are negative.    Objective:    Physical Exam Constitutional:      General: He is not in acute distress.    Appearance: Normal appearance. He is normal weight. He is not ill-appearing, toxic-appearing or diaphoretic.  Cardiovascular:     Rate and Rhythm: Normal rate and regular rhythm.     Heart sounds: Normal heart sounds. No murmur heard.   No friction rub. No gallop.  Pulmonary:     Effort: Pulmonary effort is normal. No respiratory distress.     Breath sounds: Normal breath sounds. No stridor. No  wheezing, rhonchi or rales.  Chest:     Chest wall: No tenderness.  Musculoskeletal:     Comments: Bilateral traumatic olecranon bursitis  Neurological:     General: No focal deficit present.     Mental Status: He is alert and oriented to person, place, and time. Mental status is at baseline.  Psychiatric:        Mood and Affect: Mood normal.        Behavior: Behavior normal.        Thought Content: Thought  content normal.        Judgment: Judgment normal.    BP 98/65    Pulse 85    Temp 98.7 F (37.1 C) (Temporal)    Resp 18    Ht $R'5\' 11"'sY$  (1.803 m)    SpO2 98%    BMI 23.85 kg/m  Wt Readings from Last 3 Encounters:  07/21/21 150 lb (68 kg)  03/07/21 188 lb 6.4 oz (85.5 kg)  02/14/21 185 lb 3 oz (84 kg)     Health Maintenance Due  Topic Date Due   Zoster Vaccines- Shingrix (2 of 2) 07/20/2019   COVID-19 Vaccine (2 - Janssen risk series) 10/18/2019   INFLUENZA VACCINE  02/10/2021    There are no preventive care reminders to display for this patient.  Lab Results  Component Value Date   TSH 0.554 08/23/2020   Lab Results  Component Value Date   WBC 4.7 07/21/2021   HGB 16.7 07/21/2021   HCT 47.1 07/21/2021   MCV 94 07/21/2021   PLT 145 (L) 07/21/2021   Lab Results  Component Value Date   NA 138 07/21/2021   K 4.1 07/21/2021   CO2 23 07/21/2021   GLUCOSE 93 07/21/2021   BUN 23 07/21/2021   CREATININE 1.43 (H) 07/21/2021   BILITOT 0.6 08/23/2020   ALKPHOS 95 08/23/2020   AST 13 08/23/2020   ALT 7 08/23/2020   PROT 7.6 08/23/2020   ALBUMIN 4.7 08/23/2020   CALCIUM 9.3 07/21/2021   ANIONGAP 6 02/14/2021   EGFR 56 (L) 07/21/2021   GFR 84.75 01/08/2017   Lab Results  Component Value Date   CHOL 199 08/23/2020   Lab Results  Component Value Date   HDL 40 08/23/2020   Lab Results  Component Value Date   LDLCALC 129 (H) 08/23/2020   Lab Results  Component Value Date   TRIG 170 (H) 08/23/2020   Lab Results  Component Value Date   CHOLHDL 5.0 08/23/2020   Lab Results  Component Value Date   HGBA1C 5.8 (H) 08/23/2020      Assessment & Plan:   Problem List Items Addressed This Visit       Other   Erectile dysfunction   Chronic nausea   Other Visit Diagnoses     Fatigue, unspecified type    -  Primary   Relevant Orders   CBC With Differential (Completed)   Comprehensive metabolic panel (Completed)   TSH (Completed)   Hemoglobin A1c  (Completed)   Lipid panel (Completed)   C-reactive protein (Completed)   HIV antibody (with reflex) (Completed)   RPR (Completed)   Vitamin D, 25-hydroxy (Completed)   Vitamin B12 (Completed)   Bilious vomiting with nausea       Relevant Orders   CBC With Differential (Completed)   Comprehensive metabolic panel (Completed)   TSH (Completed)   Hemoglobin A1c (Completed)   Lipid panel (Completed)   C-reactive protein (Completed)   HIV antibody (with  reflex) (Completed)   RPR (Completed)   Vitamin D, 25-hydroxy (Completed)   Vitamin B12 (Completed)   Unexplained weight loss           Meds ordered this encounter  Medications   DISCONTD: ondansetron (ZOFRAN-ODT) 8 MG disintegrating tablet    Sig: Take 1 tablet (8 mg total) by mouth every 8 (eight) hours as needed for nausea or vomiting.    Dispense:  90 tablet    Refill:  1    Order Specific Question:   Supervising Provider    Answer:   Carlota Raspberry, JEFFREY R [[id number]]   DISCONTD: tadalafil (CIALIS) 20 MG tablet    Sig: Take 0.5-1 tablets (10-20 mg total) by mouth every other day as needed for erectile dysfunction.    Dispense:  30 tablet    Refill:  11    Order Specific Question:   Supervising Provider    Answer:   Carlota Raspberry, JEFFREY R [[id number]]    Follow-up: No follow-ups on file.   PLAN Refill tadalafil and zofran Labs as above. Unclear cause to weakness.  Recommend avoiding trauma to elbows and keeping pressure on bursitis. Should spontaneously resolve.  Patient encouraged to call clinic with any questions, comments, or concerns.  [name], NP

## 2021-08-08 NOTE — Pre-Procedure Instructions (Signed)
Attempted to call patient regarding procedure instructions.  No answer 

## 2021-08-11 ENCOUNTER — Encounter (HOSPITAL_COMMUNITY)
Admission: RE | Disposition: A | Discharge status: Home / Self Care | Payer: Self-pay | Source: Home / Self Care | Attending: Internal Medicine

## 2021-08-11 ENCOUNTER — Ambulatory Visit (HOSPITAL_COMMUNITY)
Admission: RE | Admit: 2021-08-11 | Discharge: 2021-08-11 | Disposition: A | Discharge status: Home / Self Care | Payer: BC Managed Care – PPO | Attending: Internal Medicine | Admitting: Internal Medicine

## 2021-08-11 ENCOUNTER — Other Ambulatory Visit: Payer: Self-pay

## 2021-08-11 DIAGNOSIS — I255 Ischemic cardiomyopathy: Secondary | ICD-10-CM | POA: Diagnosis not present

## 2021-08-11 DIAGNOSIS — F17210 Nicotine dependence, cigarettes, uncomplicated: Secondary | ICD-10-CM | POA: Diagnosis not present

## 2021-08-11 DIAGNOSIS — Z4502 Encounter for adjustment and management of automatic implantable cardiac defibrillator: Secondary | ICD-10-CM | POA: Insufficient documentation

## 2021-08-11 DIAGNOSIS — F419 Anxiety disorder, unspecified: Secondary | ICD-10-CM | POA: Insufficient documentation

## 2021-08-11 DIAGNOSIS — I472 Ventricular tachycardia, unspecified: Secondary | ICD-10-CM | POA: Diagnosis not present

## 2021-08-11 DIAGNOSIS — R0602 Shortness of breath: Secondary | ICD-10-CM | POA: Diagnosis not present

## 2021-08-11 DIAGNOSIS — Z95810 Presence of automatic (implantable) cardiac defibrillator: Secondary | ICD-10-CM

## 2021-08-11 DIAGNOSIS — I5022 Chronic systolic (congestive) heart failure: Secondary | ICD-10-CM

## 2021-08-11 HISTORY — PX: ICD GENERATOR CHANGEOUT: EP1231

## 2021-08-11 SURGERY — ICD GENERATOR CHANGEOUT

## 2021-08-11 MED ORDER — ONDANSETRON HCL 4 MG/2ML IJ SOLN: 4.0000 mg | Freq: Four times a day (QID) | INTRAMUSCULAR | Status: DC | PRN

## 2021-08-11 MED ORDER — SODIUM CHLORIDE 0.9 % IV SOLN: INTRAVENOUS | Status: DC

## 2021-08-11 MED ORDER — SODIUM CHLORIDE 0.9 % IV SOLN
80.0000 mg | INTRAVENOUS | Status: AC
  Administered 2021-08-11: 80 mg

## 2021-08-11 MED ORDER — CEFAZOLIN SODIUM-DEXTROSE 2-4 GM/100ML-% IV SOLN
INTRAVENOUS | Status: AC
  Filled 2021-08-11: qty 100

## 2021-08-11 MED ORDER — LIDOCAINE HCL 1 % IJ SOLN
INTRAMUSCULAR | Status: AC
  Filled 2021-08-11: qty 60

## 2021-08-11 MED ORDER — ACETAMINOPHEN 325 MG PO TABS: 325.0000 mg | ORAL_TABLET | ORAL | Status: DC | PRN

## 2021-08-11 MED ORDER — SODIUM CHLORIDE 0.9 % IV SOLN
INTRAVENOUS | Status: AC
  Filled 2021-08-11: qty 2

## 2021-08-11 MED ORDER — LIDOCAINE HCL (PF) 1 % IJ SOLN
INTRAMUSCULAR | Status: DC | PRN
  Administered 2021-08-11: 60 mL

## 2021-08-11 MED ORDER — CEFAZOLIN SODIUM-DEXTROSE 2-4 GM/100ML-% IV SOLN
2.0000 g | INTRAVENOUS | Status: AC
  Administered 2021-08-11: 2 g via INTRAVENOUS

## 2021-08-11 SURGICAL SUPPLY — 6 items
CABLE SURGICAL S-101-97-12 (CABLE) ×3 IMPLANT
ICD GALLANT DR CDDRA500Q (ICD Generator) ×1 IMPLANT
PAD DEFIB RADIO PHYSIO CONN (PAD) ×3 IMPLANT
POUCH AIGIS-R ANTIBACT ICD (Mesh General) ×2 IMPLANT
POUCH AIGIS-R ANTIBACT ICD LRG (Mesh General) IMPLANT
TRAY PACEMAKER INSERTION (PACKS) ×3 IMPLANT

## 2021-08-11 NOTE — Interval H&P Note (Signed)
History and Physical Interval Note:  08/11/2021 2:56 PM  [name]  has presented today for surgery, with the diagnosis of eri.  The various methods of treatment have been discussed with the patient and family. After consideration of risks, benefits and other options for treatment, the patient has consented to  Procedure(s): ICD Swan (N/A) as a surgical intervention.  The patient's history has been reviewed, patient examined, no change in status, stable for surgery.  I have reviewed the patient's chart and labs.  Questions were answered to the patient's satisfaction.     Virl Axe

## 2021-08-12 ENCOUNTER — Encounter (HOSPITAL_COMMUNITY): Payer: Self-pay | Admitting: Internal Medicine

## 2021-08-20 ENCOUNTER — Telehealth: Payer: Self-pay | Admitting: Internal Medicine

## 2021-08-20 NOTE — Telephone Encounter (Signed)
Mr Mazur came in today saying that he would need bloodwork for his appt tomorrow 08/21/21, a wound check. When I let him know I could not find any orders for the lab work he said someone had called him yesterday advising him to come in for labs. He could not tell me who called nor was there any documentation of a call being made. I called triage for further assistance and there was nothing missing. Mr Kloster said he had to get lab work so he could get antibiotics for his changeout and he was fearing it wasn't working. He was very adamant he had to get it done today. He was also asking about a Nira Conn, not sure if this CHF clinic nurse Nira Conn or who it is? Mr Degroote said he would come back tomorrow for his wound check and would have the labwork done then. Please advise patient/figure out what is going on.

## 2021-08-20 NOTE — Telephone Encounter (Signed)
I do not know what this is in reference to. Routing to [employer], Fish farm manager.

## 2021-08-21 ENCOUNTER — Other Ambulatory Visit: Payer: Self-pay

## 2021-08-21 ENCOUNTER — Ambulatory Visit (INDEPENDENT_AMBULATORY_CARE_PROVIDER_SITE_OTHER): Payer: BC Managed Care – PPO

## 2021-08-21 ENCOUNTER — Telehealth: Payer: Self-pay

## 2021-08-21 DIAGNOSIS — I255 Ischemic cardiomyopathy: Secondary | ICD-10-CM

## 2021-08-21 LAB — CUP PACEART INCLINIC DEVICE CHECK
Battery Remaining Longevity: 117 mo
Brady Statistic RA Percent Paced: 2 %
Brady Statistic RV Percent Paced: 0 %
Date Time Interrogation Session: 20230209160655
HighPow Impedance: 67.5 Ohm
Implantable Lead Implant Date: 20120201
Implantable Lead Implant Date: 20120201
Implantable Lead Location: 753859
Implantable Lead Location: 753860
Implantable Pulse Generator Implant Date: 20230131
Lead Channel Impedance Value: 437.5 Ohm
Lead Channel Impedance Value: 475 Ohm
Lead Channel Pacing Threshold Amplitude: 0.75 V
Lead Channel Pacing Threshold Amplitude: 0.75 V
Lead Channel Pacing Threshold Amplitude: 1.25 V
Lead Channel Pacing Threshold Amplitude: 1.25 V
Lead Channel Pacing Threshold Pulse Width: 0.5 ms
Lead Channel Pacing Threshold Pulse Width: 0.5 ms
Lead Channel Pacing Threshold Pulse Width: 0.7 ms
Lead Channel Pacing Threshold Pulse Width: 0.7 ms
Lead Channel Sensing Intrinsic Amplitude: 12 mV
Lead Channel Sensing Intrinsic Amplitude: 3.7 mV
Lead Channel Setting Pacing Amplitude: 2 V
Lead Channel Setting Pacing Amplitude: 2.5 V
Lead Channel Setting Pacing Pulse Width: 0.7 ms
Lead Channel Setting Sensing Sensitivity: 0.5 mV
Pulse Gen Serial Number: 111050201

## 2021-08-21 NOTE — Patient Instructions (Addendum)
° °  After Your ICD (Implantable Cardiac Defibrillator)    Monitor your defibrillator site for redness, swelling, and drainage. Call the device clinic at [phone] if you experience these symptoms or fever/chills.  Your incision was closed with Dermabond:  You may shower 1 day after your defibrillator implant and wash your incision with soap and water. Avoid lotions, ointments, or perfumes over your incision until it is well-healed. Do not pick at incision site.   Wear a clean shirt to bed and put a clean shirt on in the morning/ if incision site starts to bleed cover with sterile 4x4s and tape    Contact [employer] for MRI compatibility   Your ICD is designed to protect you from life threatening heart rhythms. Because of this, you may receive a shock.   1 shock with no symptoms:  Call the office during business hours. 1 shock with symptoms (chest pain, chest pressure, dizziness, lightheadedness, shortness of breath, overall feeling unwell):  Call 911. If you experience 2 or more shocks in 24 hours:  Call 911. If you receive a shock, you should not drive.  [place] DMV - no driving for 6 months if you receive appropriate therapy from your ICD.   ICD Alerts:  Some alerts are vibratory and others beep. These are NOT emergencies. Please call our office to let us know. If this occurs at night or on weekends, it can wait until the next business day. Send a remote transmission.   Remote monitoring is used to monitor your ICD from home. This monitoring is scheduled every 91 days by our office. It allows [place] to keep an eye on the functioning of your device to ensure it is working properly. You will routinely see your Electrophysiologist annually (more often if necessary).

## 2021-08-21 NOTE — Progress Notes (Signed)
Wound check appointment. Dermabond removed. Wound without redness or edema. Scab noted at left lateral corner (patient stated that the dermabond came off and he applied hand sanitizer to it, unable to removed scab, R. Charlcie Cradle assessed site recommend to leave open to air and not get wet until wound re-check 08/28/21.   Normal device function. Thresholds, sensing, and impedances consistent with implant measurements. Device programmed at chronic output values. Histogram distribution appropriate for patient and level of activity. No mode switches or ventricular arrhythmias noted. Patient educated about wound care, shock plan. Reinforced to patient the importance of not applying any lotion, or ointments to site    Addendum: Dr. Caryl Comes recommended patient to wash site with clean wash cloth and soap every day.

## 2021-08-21 NOTE — Telephone Encounter (Signed)
Spoke with patient informed him that Dr. Caryl Comes had recommend patient wash incision site with soap and water clean wash cloth every day, and to use a clean wash cloth every day. Informed him that if site starts to bleed to hold pressure with 4x4s, that were given to patient at wound check. Had patient repeat instructions back to me patient successfully repeated these instructions. Reminded patient again to not put any lotion, ointments or hand sanitizer on incision site to wash with soap and water only, patient voiced understanding.

## 2021-08-21 NOTE — Telephone Encounter (Signed)
Unaware of what labs pt is referring to.

## 2021-08-28 ENCOUNTER — Ambulatory Visit (INDEPENDENT_AMBULATORY_CARE_PROVIDER_SITE_OTHER): Payer: Self-pay

## 2021-08-28 ENCOUNTER — Ambulatory Visit: Payer: Self-pay

## 2021-08-28 ENCOUNTER — Other Ambulatory Visit: Payer: Self-pay

## 2021-08-28 DIAGNOSIS — I255 Ischemic cardiomyopathy: Secondary | ICD-10-CM

## 2021-08-28 NOTE — Progress Notes (Signed)
Patient in office for wound recheck.  Site distal end of incision observed to have thick scab present.  Dr. Caryl Comes in to assess, removed scab, there appeared to be a small piece of retained stitch.  MD instructed patient to cleanse site with clean washcloth and soap 2-3 x per day, apply bacitracin and cover with bandaid.  Return in 1 week for recheck.    Patient provided verbal consent for site to be photographed and entered into chart.

## 2021-08-28 NOTE — Patient Instructions (Signed)
Wash site 2-3 times per day with new clean washcloth and soap each time.  Apply bacitracin and a bandaid after each washing.

## 2021-08-29 ENCOUNTER — Telehealth: Payer: Self-pay | Admitting: Registered Nurse

## 2021-08-29 NOTE — Telephone Encounter (Signed)
Caller Name Palmer Fahrner Caller Phone Number [phone] Patient Name [name] Patient DOB September 29, 1960 Call Type Message Only Information Provided Reason for Call Request to Schedule Office Appointment Initial Comment Patient has been trying to get in touch with [name], NP. He would like to schedule an appointment ASAP. He just had a pacemaker and defibrillator put in. He reports new leg numbness. Nurse triage was offered and caller declined. He doesn't want to speak with a nurse. He would like to see [name], NP ASAP pls. Provided office hours and office #. He will call clinic tomorrow. Patient request to speak to RN No Disp. Time Disposition Final User 08/28/2021 10:34:44 PM General Information Provided Yes Monyjang, Rudi Call Closed By: Shireen Quan Transaction Date/Time: 08/28/2021 10:27:33 PM (ET)   No open appts today

## 2021-08-30 NOTE — Progress Notes (Signed)
Established Patient Office Visit  Subjective:  Patient ID: [name], male    DOB: [birth year]  Age: 61 y.o. MRN: [id number]  CC:  Chief Complaint  Patient presents with   abss on elbow on left side    3  months.    Follow-up    On Home Health orders of 24/7 nursing care from [facility].     HPI [name] presents for follow up on paperwork, bursitis, and cellulitis  Bursitis L olecranon Traumatic Apparent that there is blood in bursitis. No infection - no heat or streaking.  Paperwork Have continued to work to get him set up with home health through [facility] Discussed that we have completed our portions of paperwork   Cellulitis Dependent edema, now red and warm Some oozing Seems to be worsening, getting warmer. Has had cellulitis in the past.    Past Medical History:  Diagnosis Date   Anxiety    Automatic implantable cardiac defibrillator [employer]    Analyze ST   Benign prostatic hypertrophy    Benzodiazepine dependence (HCC)    chronic   CAD (coronary artery disease)    Last cath 2/12. 3-v CAD. Failed PCI of distal RCAc  CATH [facility] 4/13 with DES to  LAD X2   CHF (congestive heart failure) (HCC)    Chronic back pain    lumbar stenosis   Chronic systolic heart failure (HCC)    EF 20-25%. s/p ST[employer] Jude ICD   Depression    DJD (degenerative joint disease)    History of testicular cancer    Ischemic cardiomyopathy    Myocardial infarction [facility])    Narcotic dependence ([place])    chronic   OSA on CPAP    Polycythemia, secondary    S/p hematology consultation for polycythema on 01/06/18.  S/p bone marrow biopsy on 12/31/17 that was negative for myeloproliferative disorder.  Feels secondary to sleep apnea, active smoking, testosterone supplementation, nocturnal hypoxia   TIA (transient ischemic attack)    Vitamin B12 deficiency 07/22/2016    Past Surgical History:  Procedure Laterality Date   ASD REPAIR, SINUS VENOSUS     CARDIAC DEFIBRILLATOR PLACEMENT  08/2010    HERNIA REPAIR     ICD GENERATOR CHANGEOUT N/A 08/11/2021   Procedure: ICD GENERATOR CHANGEOUT;  Surgeon: Deboraha Sprang, MD;  Location: [place] CV LAB;  Service: Cardiovascular;  Laterality: N/A;   LEFT HEART CATHETERIZATION WITH CORONARY ANGIOGRAM N/A 06/14/2014   Procedure: LEFT HEART CATHETERIZATION WITH CORONARY ANGIOGRAM;  Surgeon: Jolaine Artist, MD;  Location: [facility] CATH LAB;  Service: Cardiovascular;  Laterality: N/A;   TESTICLE SURGERY     testicular cancer surgery left testicle removed    Family History  Problem Relation Age of Onset   Heart disease Father 38       Died of MI   Cancer Mother     Social History   Socioeconomic History   Marital status: Divorced    Spouse name: Not on file   Number of children: 4   Years of education: Not on file   Highest education level: Not on file  Occupational History   Not on file  Tobacco Use   Smoking status: Every Day    Packs/day: 0.50    Years: 29.00    Pack years: 14.50    Types: Cigarettes, E-cigarettes    Last attempt to quit: 07/14/2015    Years since quitting: 6.1   Smokeless tobacco: Never   Tobacco comments:  no cigarette in 4 days but yes to e-cig  Vaping Use   Vaping Use: Every day   Start date: 07/14/2015  Substance and Sexual Activity   Alcohol use: No    Alcohol/week: 0.0 standard drinks   Drug use: Yes    Types: Marijuana    Comment: Urine showed THC   Sexual activity: Yes    Partners: Male  Other Topics Concern   Not on file  Social History Narrative   Marital status: married x 12 years; wife is severe alcoholic.      Children: 3 married daughters (23, 41, 58); 1 son (41yo); 3 grandchildren born in 2017      Lives: with wife, son      Left-handed   Caffeine: 3 drinks per day   Social Determinants of Health   Financial Resource Strain: Not on file  Food Insecurity: Not on file  Transportation Needs: Not on file  Physical Activity: Not on file  Stress: Not on file  Social Connections:  Not on file  Intimate Partner Violence: Not on file    Outpatient Medications Prior to Visit  Medication Sig Dispense Refill   ALPRAZolam (XANAX) 1 MG tablet Take 1 mg by mouth 4 (four) times daily.      carvedilol (COREG) 25 MG tablet Take 25 mg by mouth 2 (two) times daily with a meal.     diazepam (VALIUM) 10 MG tablet Take 20 mg by mouth every 12 (twelve) hours as needed for anxiety.   2   erythromycin ophthalmic ointment Place 1 application into the right eye at bedtime. (Patient taking differently: Place 1 application into the right eye at bedtime as needed (irritation).) 3.5 g 1   ibuprofen (ADVIL,MOTRIN) 200 MG tablet Take 800 mg by mouth every 6 (six) hours as needed for moderate pain.     lisinopril (PRINIVIL,ZESTRIL) 5 MG tablet Take 5 mg by mouth daily.     methadone (METHADOSE) 40 MG disintegrating tablet Take 200 mg by mouth daily. [facility]     naproxen (NAPROSYN) 500 MG tablet Take 1 tablet (500 mg total) by mouth 2 (two) times daily as needed for moderate pain. 60 tablet 2   nitroGLYCERIN (NITROLINGUAL) 0.4 MG/SPRAY spray Place 2 sprays under the tongue every 5 (five) minutes x 3 doses as needed for chest pain.      polyethylene glycol powder (GLYCOLAX/MIRALAX) 17 GM/SCOOP powder Take 17 g by mouth 2 (two) times daily as needed for mild constipation.     Testosterone Cypionate 200 MG/ML KIT Inject 200 mg into the muscle every 14 (fourteen) days.     zolpidem (AMBIEN) 10 MG tablet Take 10 mg by mouth at bedtime as needed for sleep.     albuterol (VENTOLIN HFA) 108 (90 Base) MCG/ACT inhaler Inhale 2 puffs into the lungs every 6 (six) hours as needed for wheezing or shortness of breath. 18 g 5   clopidogrel (PLAVIX) 75 MG tablet TAKE 1 TABLET EACH DAY. 90 tablet 0   cyanocobalamin (,VITAMIN B-12,) 1000 MCG/ML injection Inject 1 mL (1,000 mcg total) into the muscle every 30 (thirty) days. 1 mL 11   dapagliflozin propanediol (FARXIGA) 10 MG TABS tablet Take 10 mg  by mouth daily before breakfast. 30 tablet 6   diclofenac (VOLTAREN) 75 MG EC tablet Take 1 tablet (75 mg total) by mouth 2 (two) times daily. (Patient not taking: Reported on 07/21/2021) 20 tablet 0   doxycycline (VIBRAMYCIN) 100 MG capsule Take 1 capsule (  100 mg total) by mouth 2 (two) times daily. 14 capsule 0   lactulose (CHRONULAC) 10 GM/15ML solution Take 15 mLs (10 g total) by mouth 2 (two) times daily as needed for mild constipation. 1892 mL 0   loperamide (IMODIUM A-D) 2 MG tablet Take 1.5 tablets (3 mg total) by mouth 4 (four) times daily as needed for diarrhea or loose stools. 30 tablet 2   losartan (COZAAR) 25 MG tablet Take 1 tablet (25 mg total) by mouth daily. 90 tablet 3   methocarbamol (ROBAXIN) 750 MG tablet TAKE 1 OR 2 TABLETS THREE TIMES A DAY AS NEEDED. 180 tablet 5   ondansetron (ZOFRAN-ODT) 8 MG disintegrating tablet Take 1 tablet (8 mg total) by mouth every 8 (eight) hours as needed for nausea or vomiting. 90 tablet 1   oxymetazoline (AFRIN) 0.05 % nasal spray Place 1 spray into both nostrils 2 (two) times daily.     pantoprazole (PROTONIX) 40 MG tablet Take 1 tablet (40 mg total) by mouth daily. 90 tablet 1   sildenafil (REVATIO) 20 MG tablet TAKE 5 TABLETS AS NEEDED. 90 tablet 0   spironolactone (ALDACTONE) 25 MG tablet TAKE (1/2) TABLET DAILY. (Patient taking differently: 25 mg. TAKE (1/2) TABLET DAILY.) 45 tablet 1   tamsulosin (FLOMAX) 0.4 MG CAPS capsule Take 1 capsule (0.4 mg total) by mouth daily after breakfast. 90 capsule 0   torsemide (DEMADEX) 20 MG tablet Take 1 tablet (20 mg total) by mouth daily as needed. For swelling in legs 90 tablet 2   traZODone (DESYREL) 100 MG tablet Take 1 tablet (100 mg total) by mouth at bedtime. 90 tablet 0   triamcinolone (NASACORT AQ) 55 MCG/ACT AERO nasal inhaler Place 1 spray into the nose daily. 1 Inhaler 12   Vitamin D, Ergocalciferol, (DRISDOL) 1.25 MG (50000 UNIT) CAPS capsule Take 1 capsule (50,000 Units total) by mouth every 7  (seven) days. 12 capsule 1   clonazePAM (KLONOPIN) 1 MG tablet Take 1 mg by mouth 3 (three) times daily as needed. (Patient not taking: No sig reported)     ezetimibe (ZETIA) 10 MG tablet TAKE 1 TABLET EACH DAY. 90 tablet 0   rosuvastatin (CRESTOR) 40 MG tablet Take 1 tablet (40 mg total) by mouth every evening. 30 tablet 2   sucralfate (CARAFATE) 1 g tablet Take 1 tablet (1 g total) by mouth 4 (four) times daily -  with meals and at bedtime. 120 tablet 0   No facility-administered medications prior to visit.    Allergies  Allergen Reactions   Darvocet [Propoxyphene N-Acetaminophen] Anaphylaxis    Throat closes    ROS Review of Systems Per hpi     Objective:    Physical Exam Constitutional:      General: He is not in acute distress.    Appearance: Normal appearance. He is normal weight. He is not ill-appearing, toxic-appearing or diaphoretic.  Cardiovascular:     Rate and Rhythm: Normal rate and regular rhythm.     Heart sounds: Normal heart sounds. No murmur heard.   No friction rub. No gallop.  Pulmonary:     Effort: Pulmonary effort is normal. No respiratory distress.     Breath sounds: Normal breath sounds. No stridor. No wheezing, rhonchi or rales.  Chest:     Chest wall: No tenderness.  Musculoskeletal:        General: Swelling (traumatic L olecranon bursitis) present.     Right lower leg: Edema present.     Left lower leg:  Edema present.  Skin:    Findings: Erythema and rash present.  Neurological:     General: No focal deficit present.     Mental Status: He is alert and oriented to person, place, and time. Mental status is at baseline.  Psychiatric:        Mood and Affect: Mood normal.        Behavior: Behavior normal.        Thought Content: Thought content normal.        Judgment: Judgment normal.    Ht 5' 11"  (1.803 m)    BMI 23.85 kg/m  Wt Readings from Last 3 Encounters:  08/11/21 180 lb (81.6 kg)  07/21/21 150 lb (68 kg)  03/07/21 188 lb 6.4 oz  (85.5 kg)     Health Maintenance Due  Topic Date Due   Zoster Vaccines- Shingrix (2 of 2) 07/20/2019   COVID-19 Vaccine (2 - Janssen risk series) 10/18/2019   INFLUENZA VACCINE  02/10/2021    There are no preventive care reminders to display for this patient.  Lab Results  Component Value Date   TSH 0.554 08/23/2020   Lab Results  Component Value Date   WBC 4.7 07/21/2021   HGB 16.7 07/21/2021   HCT 47.1 07/21/2021   MCV 94 07/21/2021   PLT 145 (L) 07/21/2021   Lab Results  Component Value Date   NA 138 07/21/2021   K 4.1 07/21/2021   CO2 23 07/21/2021   GLUCOSE 93 07/21/2021   BUN 23 07/21/2021   CREATININE 1.43 (H) 07/21/2021   BILITOT 0.6 08/23/2020   ALKPHOS 95 08/23/2020   AST 13 08/23/2020   ALT 7 08/23/2020   PROT 7.6 08/23/2020   ALBUMIN 4.7 08/23/2020   CALCIUM 9.3 07/21/2021   ANIONGAP 6 02/14/2021   EGFR 56 (L) 07/21/2021   GFR 84.75 01/08/2017   Lab Results  Component Value Date   CHOL 199 08/23/2020   Lab Results  Component Value Date   HDL 40 08/23/2020   Lab Results  Component Value Date   LDLCALC 129 (H) 08/23/2020   Lab Results  Component Value Date   TRIG 170 (H) 08/23/2020   Lab Results  Component Value Date   CHOLHDL 5.0 08/23/2020   Lab Results  Component Value Date   HGBA1C 5.8 (H) 08/23/2020      Assessment & Plan:   Problem List Items Addressed This Visit   None Visit Diagnoses     Cellulitis of lower extremity, unspecified laterality    -  Primary   Dependent edema           Meds ordered this encounter  Medications   DISCONTD: sulfamethoxazole-trimethoprim (BACTRIM DS) 800-160 MG tablet    Sig: Take 1 tablet by mouth 2 (two) times daily.    Dispense:  14 tablet    Refill:  0    Order Specific Question:   Supervising Provider    Answer:   Carlota Raspberry, JEFFREY R [[id number]]    Follow-up: No follow-ups on file.   PLAN Bactrim po bid x 1 week for cellulitis Discussed compression and fall prevention for  olecranon bursitis Return in 3-6 mo Patient encouraged to call clinic with any questions, comments, or concerns.  [name], NP

## 2021-09-01 ENCOUNTER — Telehealth: Payer: Self-pay | Admitting: Internal Medicine

## 2021-09-01 NOTE — Telephone Encounter (Signed)
Received a call from the patient, he used the call back method. When I called him back he said he could not remember what he was calling about. He was mumbling and sounded tired over the phone. He said something in regards to his recent defibrillator and I asked if he was trying to confirm his wound check appointment, he did ask when the appointment was and we got it confirmed. Patient continued to [facility], I asked if he was feeling okay and he stated, "I guess, I'm just laying in bed." He then asked about his appt with [name], NP tomorrow - he couldn't remember the time. The appt notes for that appt state that the implant is not healing well. Just wanted to send an FYI to triage to see if someone needs to check up on him.

## 2021-09-01 NOTE — Telephone Encounter (Signed)
Spoke with pt to confirm he had no further questions or concerns.  Pt repeated back appointment dates and times for this week for his wound follow up and pt thanked Therapist, sports for the callback.

## 2021-09-02 ENCOUNTER — Encounter: Payer: Self-pay | Admitting: Registered Nurse

## 2021-09-02 ENCOUNTER — Ambulatory Visit (INDEPENDENT_AMBULATORY_CARE_PROVIDER_SITE_OTHER): Payer: Self-pay | Admitting: Registered Nurse

## 2021-09-02 ENCOUNTER — Other Ambulatory Visit: Payer: Self-pay

## 2021-09-02 VITALS — BP 92/50 | HR 64 | Temp 98.2°F | Resp 18 | Ht 72.0 in | Wt 189.6 lb

## 2021-09-02 DIAGNOSIS — R5383 Other fatigue: Secondary | ICD-10-CM

## 2021-09-02 DIAGNOSIS — I5042 Chronic combined systolic (congestive) and diastolic (congestive) heart failure: Secondary | ICD-10-CM

## 2021-09-02 DIAGNOSIS — R34 Anuria and oliguria: Secondary | ICD-10-CM

## 2021-09-02 DIAGNOSIS — R634 Abnormal weight loss: Secondary | ICD-10-CM

## 2021-09-02 DIAGNOSIS — R609 Edema, unspecified: Secondary | ICD-10-CM

## 2021-09-02 DIAGNOSIS — I272 Pulmonary hypertension, unspecified: Secondary | ICD-10-CM

## 2021-09-02 DIAGNOSIS — J9611 Chronic respiratory failure with hypoxia: Secondary | ICD-10-CM

## 2021-09-02 LAB — COMPREHENSIVE METABOLIC PANEL
ALT: 17 U/L (ref 0–53)
AST: 17 U/L (ref 0–37)
Albumin: 4.2 g/dL (ref 3.5–5.2)
Alkaline Phosphatase: 75 U/L (ref 39–117)
BUN: 16 mg/dL (ref 6–23)
CO2: 32 mEq/L (ref 19–32)
Calcium: 9.1 mg/dL (ref 8.4–10.5)
Chloride: 98 mEq/L (ref 96–112)
Creatinine, Ser: 1.06 mg/dL (ref 0.40–1.50)
GFR: 75.6 mL/min (ref >60.00)
Glucose, Bld: 124 mg/dL — ABNORMAL HIGH (ref 70–99)
Potassium: 4.5 mEq/L (ref 3.5–5.1)
Sodium: 136 mEq/L (ref 135–145)
Total Bilirubin: 0.4 mg/dL (ref 0.2–1.2)
Total Protein: 6.7 g/dL (ref 6.0–8.3)

## 2021-09-02 LAB — LIPID PANEL
Cholesterol: 142 mg/dL (ref 0–200)
HDL: 39.6 mg/dL (ref >39.00)
LDL Cholesterol: 73 mg/dL (ref 0–99)
NonHDL: 102.49
Total CHOL/HDL Ratio: 4
Triglycerides: 146 mg/dL (ref 0.0–149.0)
VLDL: 29.2 mg/dL (ref 0.0–40.0)

## 2021-09-02 LAB — CBC WITH DIFFERENTIAL/PLATELET
Basophils Absolute: 0 10*3/uL (ref 0.0–0.1)
Basophils Relative: 0.6 % (ref 0.0–3.0)
Eosinophils Absolute: 0.2 10*3/uL (ref 0.0–0.7)
Eosinophils Relative: 3.8 % (ref 0.0–5.0)
HCT: 45 % (ref 39.0–52.0)
Hemoglobin: 14.9 g/dL (ref 13.0–17.0)
Lymphocytes Relative: 36.8 % (ref 12.0–46.0)
Lymphs Abs: 1.8 10*3/uL (ref 0.7–4.0)
MCHC: 33.2 g/dL (ref 30.0–36.0)
MCV: 94.4 fl (ref 78.0–100.0)
Monocytes Absolute: 0.5 10*3/uL (ref 0.1–1.0)
Monocytes Relative: 9.4 % (ref 3.0–12.0)
Neutro Abs: 2.4 10*3/uL (ref 1.4–7.7)
Neutrophils Relative %: 49.4 % (ref 43.0–77.0)
Platelets: 136 10*3/uL — ABNORMAL LOW (ref 150.0–400.0)
RBC: 4.77 Mil/uL (ref 4.22–5.81)
RDW: 13.9 % (ref 11.5–15.5)
WBC: 4.9 10*3/uL (ref 4.0–10.5)

## 2021-09-02 LAB — PSA: PSA: 0.23 ng/mL (ref 0.10–4.00)

## 2021-09-02 LAB — HEMOGLOBIN A1C: Hgb A1c MFr Bld: 6.6 % — ABNORMAL HIGH (ref 4.6–6.5)

## 2021-09-02 NOTE — Progress Notes (Signed)
Established Patient Office Visit  Subjective:  Patient ID: [name], male    DOB: 05-20-1961  Age: 61 y.o. MRN: [id number]  CC:  Chief Complaint  Patient presents with   Follow-up    Patient states he is here to discuss pacemaker and medication refill.    HPI [name] presents for follow up   Has a number of concerns:  Sleep disturbance Has been told by cardiology that he should not take He has been prescribed dalmane for this - no stock.  Seen by Dr. Casimiro Needle - he has been on zolpidem, diazepam, and alprazolam.   Pacemaker Had his replaced - past one lasted 17 years from [facility] Concerned for infection and delayed healing.  Inability to void Notes an accident at an outside office - had accident in which he was knocked over.  Since then he has had a tough time voiding urine and stool. He has had a number of back injuries in the past Denies pain but concerned for stenosis. Interested in imaging. Last imaging of L spine done in May 2021.  Otherwise no acute concerns.   Past Medical History:  Diagnosis Date   Anxiety    Automatic implantable cardiac defibrillator [employer]    Analyze ST   Benign prostatic hypertrophy    Benzodiazepine dependence (HCC)    chronic   CAD (coronary artery disease)    Last cath 2/12. 3-v CAD. Failed PCI of distal RCAc  CATH [facility] 4/13 with DES to  LAD X2   CHF (congestive heart failure) (HCC)    Chronic back pain    lumbar stenosis   Chronic systolic heart failure (HCC)    EF 20-25%. s/p ST[employer] Jude ICD   Depression    DJD (degenerative joint disease)    History of testicular cancer    Ischemic cardiomyopathy    Myocardial infarction [facility])    Narcotic dependence ([place])    chronic   OSA on CPAP    Polycythemia, secondary    S/p hematology consultation for polycythema on 01/06/18.  S/p bone marrow biopsy on 12/31/17 that was negative for myeloproliferative disorder.  Feels secondary to sleep apnea, active smoking, testosterone  supplementation, nocturnal hypoxia   TIA (transient ischemic attack)    Vitamin B12 deficiency 07/22/2016    Past Surgical History:  Procedure Laterality Date   ASD REPAIR, SINUS VENOSUS     CARDIAC DEFIBRILLATOR PLACEMENT  08/2010   HERNIA REPAIR     ICD GENERATOR CHANGEOUT N/A 08/11/2021   Procedure: ICD GENERATOR CHANGEOUT;  Surgeon: Deboraha Sprang, MD;  Location: [place] CV LAB;  Service: Cardiovascular;  Laterality: N/A;   LEFT HEART CATHETERIZATION WITH CORONARY ANGIOGRAM N/A 06/14/2014   Procedure: LEFT HEART CATHETERIZATION WITH CORONARY ANGIOGRAM;  Surgeon: Jolaine Artist, MD;  Location: [facility] CATH LAB;  Service: Cardiovascular;  Laterality: N/A;   TESTICLE SURGERY     testicular cancer surgery left testicle removed    Family History  Problem Relation Age of Onset   Heart disease Father 17       Died of MI   Cancer Mother     Social History   Socioeconomic History   Marital status: Divorced    Spouse name: Not on file   Number of children: 4   Years of education: Not on file   Highest education level: Not on file  Occupational History   Not on file  Tobacco Use   Smoking status: Every Day  Packs/day: 0.50    Years: 29.00    Pack years: 14.50    Types: Cigarettes, E-cigarettes    Last attempt to quit: 07/14/2015    Years since quitting: 6.1   Smokeless tobacco: Never   Tobacco comments:    no cigarette in 4 days but yes to e-cig  Vaping Use   Vaping Use: Every day   Start date: 07/14/2015  Substance and Sexual Activity   Alcohol use: No    Alcohol/week: 0.0 standard drinks   Drug use: Yes    Types: Marijuana    Comment: Urine showed THC   Sexual activity: Yes    Partners: Male  Other Topics Concern   Not on file  Social History Narrative   Marital status: married x 12 years; wife is severe alcoholic.      Children: 3 married daughters (23, 103, 106); 1 son (73yo); 3 grandchildren born in 2017      Lives: with wife, son      Left-handed    Caffeine: 3 drinks per day   Social Determinants of Health   Financial Resource Strain: Not on file  Food Insecurity: Not on file  Transportation Needs: Not on file  Physical Activity: Not on file  Stress: Not on file  Social Connections: Not on file  Intimate Partner Violence: Not on file    Outpatient Medications Prior to Visit  Medication Sig Dispense Refill   albuterol (VENTOLIN HFA) 108 (90 Base) MCG/ACT inhaler Inhale 2 puffs into the lungs every 6 (six) hours as needed for wheezing or shortness of breath. 18 g 5   ALPRAZolam (XANAX) 1 MG tablet Take 1 mg by mouth 4 (four) times daily.      aspirin 325 MG tablet Take 325 mg by mouth daily.     carvedilol (COREG) 25 MG tablet Take 25 mg by mouth 2 (two) times daily with a meal.     clopidogrel (PLAVIX) 75 MG tablet TAKE ONE TABLET BY MOUTH DAILY 90 tablet 0   cyanocobalamin (,VITAMIN B-12,) 1000 MCG/ML injection Inject 1 mL (1,000 mcg total) into the muscle every 30 (thirty) days. 1 mL 11   dapagliflozin propanediol (FARXIGA) 10 MG TABS tablet Take 1 tablet (10 mg total) by mouth daily before breakfast. (Patient not taking: Reported on 07/21/2021) 30 tablet 6   diazepam (VALIUM) 10 MG tablet Take 20 mg by mouth every 12 (twelve) hours as needed for anxiety.   2   erythromycin ophthalmic ointment Place 1 application into the right eye at bedtime. (Patient taking differently: Place 1 application into the right eye at bedtime as needed (irritation).) 3.5 g 1   ezetimibe (ZETIA) 10 MG tablet TAKE 1 TABLET EACH DAY. 90 tablet 0   furosemide (LASIX) 20 MG tablet Take 1 tablet (20 mg total) by mouth daily. (Patient taking differently: Take 20 mg by mouth daily as needed for edema.) 30 tablet 3   ibuprofen (ADVIL,MOTRIN) 200 MG tablet Take 800 mg by mouth every 6 (six) hours as needed for moderate pain.     lactulose (CHRONULAC) 10 GM/15ML solution Take 15 mLs (10 g total) by mouth 2 (two) times daily as needed for mild constipation. 1892 mL 0    lisinopril (PRINIVIL,ZESTRIL) 5 MG tablet Take 5 mg by mouth daily.     methadone (METHADOSE) 40 MG disintegrating tablet Take 200 mg by mouth daily. [facility]     methocarbamol (ROBAXIN) 750 MG tablet TAKE 1 OR 2 TABLETS THREE TIMES  A DAY AS NEEDED. 180 tablet 5   naproxen (NAPROSYN) 500 MG tablet Take 1 tablet (500 mg total) by mouth 2 (two) times daily as needed for moderate pain. 60 tablet 2   nitroGLYCERIN (NITROLINGUAL) 0.4 MG/SPRAY spray Place 2 sprays under the tongue every 5 (five) minutes x 3 doses as needed for chest pain.      ondansetron (ZOFRAN-ODT) 8 MG disintegrating tablet Take 1 tablet (8 mg total) by mouth every 8 (eight) hours as needed for nausea or vomiting. 90 tablet 1   pantoprazole (PROTONIX) 40 MG tablet Take 1 tablet (40 mg total) by mouth daily. 90 tablet 1   polyethylene glycol powder (GLYCOLAX/MIRALAX) 17 GM/SCOOP powder Take 17 g by mouth 2 (two) times daily as needed for mild constipation.     rosuvastatin (CRESTOR) 40 MG tablet Take 1 tablet (40 mg total) by mouth every evening. 30 tablet 2   sildenafil (VIAGRA) 100 MG tablet Take 0.5 tablets (50 mg total) by mouth daily as needed for erectile dysfunction. (Patient taking differently: Take 100 mg by mouth daily as needed for erectile dysfunction.) 30 tablet 0   spironolactone (ALDACTONE) 25 MG tablet TAKE (1/2) TABLET DAILY. (Patient taking differently: Take 25 mg by mouth daily.) 45 tablet 1   tamsulosin (FLOMAX) 0.4 MG CAPS capsule Take 1 capsule (0.4 mg total) by mouth daily after breakfast. 90 capsule 0   Testosterone Cypionate 200 MG/ML KIT Inject 200 mg into the muscle every 14 (fourteen) days.     torsemide (DEMADEX) 20 MG tablet TAKE ONE TABLET DAILY AS NEEDED FOR SWELLING IN LEGS. (Patient taking differently: Take 20 mg by mouth 2 (two) times daily.) 90 tablet 2   traZODone (DESYREL) 100 MG tablet Take 1 tablet (100 mg total) by mouth at bedtime. 90 tablet 0   triamcinolone (NASACORT AQ)  55 MCG/ACT AERO nasal inhaler Place 1 spray into the nose daily. 1 each 12   zolpidem (AMBIEN) 10 MG tablet Take 10 mg by mouth at bedtime as needed for sleep.     loperamide (IMODIUM A-D) 2 MG tablet Take 1.5 tablets (3 mg total) by mouth 4 (four) times daily as needed for diarrhea or loose stools. (Patient not taking: Reported on 08/04/2021) 30 tablet 2   losartan (COZAAR) 25 MG tablet Take 1 tablet (25 mg total) by mouth daily. (Patient not taking: Reported on 08/04/2021) 90 tablet 3   sildenafil (REVATIO) 20 MG tablet TAKE FIVE TABLETS BY MOUTH AS NEEDED (Patient not taking: Reported on 08/04/2021) 90 tablet 0   Vitamin D, Ergocalciferol, (DRISDOL) 1.25 MG (50000 UNIT) CAPS capsule Take 1 capsule (50,000 Units total) by mouth every 7 (seven) days. (Patient not taking: Reported on 08/04/2021) 12 capsule 1   No facility-administered medications prior to visit.    Allergies  Allergen Reactions   Darvocet [Propoxyphene N-Acetaminophen] Anaphylaxis    Throat closes    ROS - per hpi, otherwise negative. Review of Systems  Constitutional: Negative.   HENT: Negative.    Eyes: Negative.   Respiratory: Negative.    Cardiovascular: Negative.   Gastrointestinal: Negative.   Genitourinary: Negative.   Musculoskeletal: Negative.   Skin: Negative.   Neurological: Negative.   Psychiatric/Behavioral: Negative.    All other systems reviewed and are negative.    Objective:    Physical Exam Constitutional:      General: He is not in acute distress.    Appearance: Normal appearance. He is normal weight. He is not ill-appearing, toxic-appearing or diaphoretic.  Cardiovascular:     Rate and Rhythm: Normal rate and regular rhythm.     Heart sounds: Normal heart sounds. No murmur heard.   No friction rub. No gallop.  Pulmonary:     Effort: Pulmonary effort is normal. No respiratory distress.     Breath sounds: Normal breath sounds. No stridor. No wheezing, rhonchi or rales.  Chest:     Chest  wall: No tenderness.  Neurological:     General: No focal deficit present.     Mental Status: He is alert and oriented to person, place, and time. Mental status is at baseline.  Psychiatric:        Mood and Affect: Mood normal.        Behavior: Behavior normal.        Thought Content: Thought content normal.        Judgment: Judgment normal.    BP (!) 92/50    Pulse 64    Temp 98.2 F (36.8 C) (Temporal)    Resp 18    Ht 6' (1.829 m)    Wt 189 lb 9.6 oz (86 kg)    SpO2 97%    BMI 25.71 kg/m  Wt Readings from Last 3 Encounters:  09/02/21 189 lb 9.6 oz (86 kg)  08/11/21 180 lb (81.6 kg)  07/21/21 150 lb (68 kg)     Health Maintenance Due  Topic Date Due   Zoster Vaccines- Shingrix (2 of 2) 07/20/2019    There are no preventive care reminders to display for this patient.  Lab Results  Component Value Date   TSH 0.554 08/23/2020   Lab Results  Component Value Date   WBC 4.7 07/21/2021   HGB 16.7 07/21/2021   HCT 47.1 07/21/2021   MCV 94 07/21/2021   PLT 145 (L) 07/21/2021   Lab Results  Component Value Date   NA 138 07/21/2021   K 4.1 07/21/2021   CO2 23 07/21/2021   GLUCOSE 93 07/21/2021   BUN 23 07/21/2021   CREATININE 1.43 (H) 07/21/2021   BILITOT 0.6 08/23/2020   ALKPHOS 95 08/23/2020   AST 13 08/23/2020   ALT 7 08/23/2020   PROT 7.6 08/23/2020   ALBUMIN 4.7 08/23/2020   CALCIUM 9.3 07/21/2021   ANIONGAP 6 02/14/2021   EGFR 56 (L) 07/21/2021   GFR 84.75 01/08/2017   Lab Results  Component Value Date   CHOL 199 08/23/2020   Lab Results  Component Value Date   HDL 40 08/23/2020   Lab Results  Component Value Date   LDLCALC 129 (H) 08/23/2020   Lab Results  Component Value Date   TRIG 170 (H) 08/23/2020   Lab Results  Component Value Date   CHOLHDL 5.0 08/23/2020   Lab Results  Component Value Date   HGBA1C 5.8 (H) 08/23/2020      Assessment & Plan:   Problem List Items Addressed This Visit       Cardiovascular and Mediastinum    COMBINED HEART FAILURE, CHRONIC   Relevant Orders   CBC with Differential/Platelet   Comprehensive metabolic panel   Hemoglobin A1c   Lipid panel   PSA     Respiratory   Chronic respiratory failure with hypoxia (HCC) - Primary   Relevant Orders   CBC with Differential/Platelet   Comprehensive metabolic panel   Hemoglobin A1c   Lipid panel   PSA   Other Visit Diagnoses     Dependent edema       Relevant Orders  CBC with Differential/Platelet   Comprehensive metabolic panel   Hemoglobin A1c   Lipid panel   PSA   Pulmonary hypertension (HCC)       Relevant Orders   CBC with Differential/Platelet   Comprehensive metabolic panel   Hemoglobin A1c   Lipid panel   PSA   Unexplained weight loss       Relevant Orders   CBC with Differential/Platelet   Comprehensive metabolic panel   Hemoglobin A1c   Lipid panel   PSA   Fatigue, unspecified type       Relevant Orders   CBC with Differential/Platelet   Comprehensive metabolic panel   Hemoglobin A1c   Lipid panel   PSA   Low urine output       Relevant Orders   PSA       No orders of the defined types were placed in this encounter.   Follow-up: Return in about 3 months (around 11/30/2021) for chronic conditions.   PLAN Labs as above Will defer to Dr. Casimiro Needle on sleep aid as he has had rx for zolpidem, alprazolam, and diazpam all filled in the past few weeks.  Xray lumbar spine for back injury. Concern for stenosis. He is still voiding in small amounts.  Patient encouraged to call clinic with any questions, comments, or concerns.  [name], NP

## 2021-09-02 NOTE — Patient Instructions (Signed)
° ° ° °  If you have lab work done today you will be contacted with your lab results within the next 2 weeks.  If you have not heard from us then please contact us. The fastest way to get your results is to register for My Chart. ° ° °IF you received an x-ray today, you will receive an invoice from [facility]. Please contact [facility] at [phone] with questions or concerns regarding your invoice.  ° °IF you received labwork today, you will receive an invoice from [facility]. Please contact [facility] at [phone] with questions or concerns regarding your invoice.  ° °Our billing staff will not be able to assist you with questions regarding bills from these companies. ° °You will be contacted with the lab results as soon as they are available. The fastest way to get your results is to activate your My Chart account. Instructions are located on the last page of this paperwork. If you have not heard from us regarding the results in 2 weeks, please contact this office. °  ° ° ° °

## 2021-09-03 ENCOUNTER — Other Ambulatory Visit: Payer: Self-pay | Admitting: Registered Nurse

## 2021-09-03 DIAGNOSIS — D696 Thrombocytopenia, unspecified: Secondary | ICD-10-CM

## 2021-09-03 DIAGNOSIS — R634 Abnormal weight loss: Secondary | ICD-10-CM

## 2021-09-04 ENCOUNTER — Encounter: Payer: Self-pay | Admitting: Registered Nurse

## 2021-09-04 ENCOUNTER — Other Ambulatory Visit: Payer: Self-pay

## 2021-09-04 ENCOUNTER — Ambulatory Visit (INDEPENDENT_AMBULATORY_CARE_PROVIDER_SITE_OTHER): Payer: Self-pay

## 2021-09-04 ENCOUNTER — Telehealth: Payer: Self-pay | Admitting: Oncology

## 2021-09-04 DIAGNOSIS — I255 Ischemic cardiomyopathy: Secondary | ICD-10-CM

## 2021-09-04 NOTE — Telephone Encounter (Signed)
Scheduled appt per 2/22 referral. Pt was already established with Dr. Alen Blew. Pt is aware of appt date and time. Pt is aware to arrive 15 mins prior to appt time and to bring and updated insurance card. Pt is aware of appt location.

## 2021-09-04 NOTE — Patient Instructions (Signed)
Continue to wash your incision with a clean wash cloth and warm water and soap. Call if you see any swelling, drainage or redness.  [facility]  [phone]

## 2021-09-04 NOTE — Telephone Encounter (Signed)
Richard, If you wouldnt mind calling me at your earliest opportunity. I would be very grateful. I just got new insurance and I need refills on all my medications   The CBC with diff obviously concerns me greatly. If you have a moment, I really would like to speak to you about this. I am really scared.   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 If I was an old blood type, it would be more likely Agirre non-Hodgkins lymphoma. With an a blood type, apparently its urgent just some form of leukemia. Im sure you know more about this than myself. I look forward to speaking with you no more cigars see you and yours.   Respectfully, yours,   [name].

## 2021-09-05 ENCOUNTER — Inpatient Hospital Stay: Payer: Self-pay | Attending: Oncology | Admitting: Oncology

## 2021-09-05 VITALS — BP 118/81 | HR 79 | Temp 97.8°F | Resp 17 | Ht 72.0 in | Wt 185.7 lb

## 2021-09-05 DIAGNOSIS — Z79899 Other long term (current) drug therapy: Secondary | ICD-10-CM | POA: Insufficient documentation

## 2021-09-05 DIAGNOSIS — F17210 Nicotine dependence, cigarettes, uncomplicated: Secondary | ICD-10-CM | POA: Insufficient documentation

## 2021-09-05 DIAGNOSIS — R634 Abnormal weight loss: Secondary | ICD-10-CM | POA: Insufficient documentation

## 2021-09-05 DIAGNOSIS — D696 Thrombocytopenia, unspecified: Secondary | ICD-10-CM | POA: Insufficient documentation

## 2021-09-05 DIAGNOSIS — D751 Secondary polycythemia: Secondary | ICD-10-CM | POA: Insufficient documentation

## 2021-09-05 NOTE — Progress Notes (Signed)
Hematology and Oncology Follow Up Visit  [name] [id number] 08-07-1960 61 y.o. 09/05/2021 3:14 PM [name], NPMorrow, Richard, NP   Principle Diagnosis: 61 year old man with secondary polycythemia diagnosed in January 2019.  He has a mild thrombocytopenia as well noted in 2023.   Current therapy: No treatment needed.  Interim History: [name] returns today for a follow-up visit.  Since last visit, he had a CBC done on September 02, 2021 which showed a platelet count of 136.  He has a white cell count of 4.9 with normal differential and normal hemoglobin and hematocrit.  Clinically, he reports continuous weight loss no appetite changes.  Denies any chest pain or difficulty breathing.  He denies any bruising or petechiae but does report excessive bleeding.  Medications: Updated on review. Current Outpatient Medications  Medication Sig Dispense Refill   albuterol (VENTOLIN HFA) 108 (90 Base) MCG/ACT inhaler Inhale 2 puffs into the lungs every 6 (six) hours as needed for wheezing or shortness of breath. 18 g 5   ALPRAZolam (XANAX) 1 MG tablet Take 1 mg by mouth 4 (four) times daily.      aspirin 325 MG tablet Take 325 mg by mouth daily.     carvedilol (COREG) 25 MG tablet Take 25 mg by mouth 2 (two) times daily with a meal.     clopidogrel (PLAVIX) 75 MG tablet TAKE ONE TABLET BY MOUTH DAILY 90 tablet 0   cyanocobalamin (,VITAMIN B-12,) 1000 MCG/ML injection Inject 1 mL (1,000 mcg total) into the muscle every 30 (thirty) days. 1 mL 11   dapagliflozin propanediol (FARXIGA) 10 MG TABS tablet Take 1 tablet (10 mg total) by mouth daily before breakfast. (Patient not taking: Reported on 07/21/2021) 30 tablet 6   diazepam (VALIUM) 10 MG tablet Take 20 mg by mouth every 12 (twelve) hours as needed for anxiety.   2   erythromycin ophthalmic ointment Place 1 application into the right eye at bedtime. (Patient taking differently: Place 1 application into the right eye at bedtime as needed  (irritation).) 3.5 g 1   ezetimibe (ZETIA) 10 MG tablet TAKE 1 TABLET EACH DAY. 90 tablet 0   furosemide (LASIX) 20 MG tablet Take 1 tablet (20 mg total) by mouth daily. (Patient taking differently: Take 20 mg by mouth daily as needed for edema.) 30 tablet 3   ibuprofen (ADVIL,MOTRIN) 200 MG tablet Take 800 mg by mouth every 6 (six) hours as needed for moderate pain.     lactulose (CHRONULAC) 10 GM/15ML solution Take 15 mLs (10 g total) by mouth 2 (two) times daily as needed for mild constipation. 1892 mL 0   lisinopril (PRINIVIL,ZESTRIL) 5 MG tablet Take 5 mg by mouth daily.     methadone (METHADOSE) 40 MG disintegrating tablet Take 200 mg by mouth daily. [facility]     methocarbamol (ROBAXIN) 750 MG tablet TAKE 1 OR 2 TABLETS THREE TIMES A DAY AS NEEDED. 180 tablet 5   naproxen (NAPROSYN) 500 MG tablet Take 1 tablet (500 mg total) by mouth 2 (two) times daily as needed for moderate pain. 60 tablet 2   nitroGLYCERIN (NITROLINGUAL) 0.4 MG/SPRAY spray Place 2 sprays under the tongue every 5 (five) minutes x 3 doses as needed for chest pain.      ondansetron (ZOFRAN-ODT) 8 MG disintegrating tablet Take 1 tablet (8 mg total) by mouth every 8 (eight) hours as needed for nausea or vomiting. 90 tablet 1   pantoprazole (PROTONIX) 40 MG tablet Take 1  tablet (40 mg total) by mouth daily. 90 tablet 1   polyethylene glycol powder (GLYCOLAX/MIRALAX) 17 GM/SCOOP powder Take 17 g by mouth 2 (two) times daily as needed for mild constipation.     rosuvastatin (CRESTOR) 40 MG tablet Take 1 tablet (40 mg total) by mouth every evening. 30 tablet 2   sildenafil (VIAGRA) 100 MG tablet Take 0.5 tablets (50 mg total) by mouth daily as needed for erectile dysfunction. (Patient taking differently: Take 100 mg by mouth daily as needed for erectile dysfunction.) 30 tablet 0   spironolactone (ALDACTONE) 25 MG tablet TAKE (1/2) TABLET DAILY. (Patient taking differently: Take 25 mg by mouth daily.) 45 tablet 1    tamsulosin (FLOMAX) 0.4 MG CAPS capsule Take 1 capsule (0.4 mg total) by mouth daily after breakfast. 90 capsule 0   Testosterone Cypionate 200 MG/ML KIT Inject 200 mg into the muscle every 14 (fourteen) days.     torsemide (DEMADEX) 20 MG tablet TAKE ONE TABLET DAILY AS NEEDED FOR SWELLING IN LEGS. (Patient taking differently: Take 20 mg by mouth 2 (two) times daily.) 90 tablet 2   traZODone (DESYREL) 100 MG tablet Take 1 tablet (100 mg total) by mouth at bedtime. 90 tablet 0   triamcinolone (NASACORT AQ) 55 MCG/ACT AERO nasal inhaler Place 1 spray into the nose daily. 1 each 12   zolpidem (AMBIEN) 10 MG tablet Take 10 mg by mouth at bedtime as needed for sleep.     No current facility-administered medications for this visit.     Allergies:  Allergies  Allergen Reactions   Darvocet [Propoxyphene N-Acetaminophen] Anaphylaxis    Throat closes        Physical Exam:  Blood pressure 118/81, pulse 79, temperature 97.8 F (36.6 C), temperature source Temporal, resp. rate 17, height 6' (1.829 m), weight 185 lb 11.2 oz (84.2 kg), SpO2 100 %.   ECOG: 2    General appearance: Comfortable appearing without any discomfort Head: Normocephalic without any trauma Oropharynx: Mucous membranes are moist and pink without any thrush or ulcers. Eyes: Pupils are equal and round reactive to light. Lymph nodes: No cervical, supraclavicular, inguinal or axillary lymphadenopathy.   Heart:regular rate and rhythm.  S1 and S2 without leg edema. Lung: Clear without any rhonchi or wheezes.  No dullness to percussion. Abdomin: Soft, nontender, nondistended with good bowel sounds.  No hepatosplenomegaly. Musculoskeletal: No joint deformity or effusion.  Full range of motion noted. Neurological: No deficits noted on motor, sensory and deep tendon reflex exam. Skin: No petechial rash or dryness.  Appeared moist.        Lab Results: Lab Results  Component Value Date   WBC 4.9 09/02/2021   HGB 14.9  09/02/2021   HCT 45.0 09/02/2021   MCV 94.4 09/02/2021   PLT 136.0 (L) 09/02/2021     Chemistry      Component Value Date/Time   NA 136 09/02/2021 1212   NA 138 07/21/2021 1529   K 4.5 09/02/2021 1212   CL 98 09/02/2021 1212   CO2 32 09/02/2021 1212   BUN 16 09/02/2021 1212   BUN 23 07/21/2021 1529   CREATININE 1.06 09/02/2021 1212   CREATININE 0.97 08/26/2017 1152   CREATININE 0.97 12/04/2014 1644      Component Value Date/Time   CALCIUM 9.1 09/02/2021 1212   ALKPHOS 75 09/02/2021 1212   AST 17 09/02/2021 1212   AST 14 08/26/2017 1152   ALT 17 09/02/2021 1212   ALT 10 08/26/2017 1152   BILITOT  0.4 09/02/2021 1212   BILITOT 0.6 08/23/2020 1154   BILITOT 0.7 08/26/2017 1152        Impression and Plan:  61 year old man with:    1.  Thrombocytopenia: This was noted on a CBC in February 2023 with a platelet count of 136.  His platelet count has fluctuated for the last 10 years with a similar pattern.  His platelet count was close to 100 and rebounded spontaneously.  The differential diagnosis of these findings were discussed.  Infiltrative bone marrow process versus reactive findings were discussed.  He is very concerned about these findings especially with his history of polycythemia.  I discussed with him having a bone marrow biopsy to rule out infiltrative bone marrow process.  Risks and benefits of this procedure were reviewed.  Complications include bleeding, infection and pain were discussed.  He is agreeable to proceed.  2.  Secondary polycythemia diagnosed in 2017 related to smoking.  CBC in February 2023 showed a hemoglobin of 14.9 without any need for further intervention.     3.  Weight loss: His weight today is 185.7.  This could be related to smoking history but will obtain a bone marrow biopsy to rule out underlying hematological malignancy.  4.  Follow-up: Will be determined pending the results of his bone marrow biopsy.  If his bone marrow biopsy is  clear no further hematological work-up is needed.  30  minutes were spent on this encounter.  The time was dedicated to reviewing laboratory data, discussing differential diagnosis and recommendation for future management.      Zola Button, MD 2/24/20233:14 PM

## 2021-09-09 ENCOUNTER — Ambulatory Visit: Payer: Self-pay | Admitting: Registered Nurse

## 2021-09-10 ENCOUNTER — Encounter: Payer: Self-pay | Admitting: Registered Nurse

## 2021-09-10 ENCOUNTER — Other Ambulatory Visit: Payer: Self-pay

## 2021-09-10 ENCOUNTER — Ambulatory Visit (INDEPENDENT_AMBULATORY_CARE_PROVIDER_SITE_OTHER): Payer: Self-pay | Admitting: Registered Nurse

## 2021-09-10 ENCOUNTER — Telehealth (HOSPITAL_COMMUNITY): Payer: Self-pay | Admitting: Licensed Clinical Social Worker

## 2021-09-10 ENCOUNTER — Telehealth: Payer: Self-pay | Admitting: Registered Nurse

## 2021-09-10 VITALS — BP 113/65 | HR 86 | Temp 98.0°F | Resp 18 | Ht 72.0 in | Wt 184.0 lb

## 2021-09-10 DIAGNOSIS — G4700 Insomnia, unspecified: Secondary | ICD-10-CM

## 2021-09-10 DIAGNOSIS — I272 Pulmonary hypertension, unspecified: Secondary | ICD-10-CM

## 2021-09-10 DIAGNOSIS — R34 Anuria and oliguria: Secondary | ICD-10-CM

## 2021-09-10 DIAGNOSIS — I5042 Chronic combined systolic (congestive) and diastolic (congestive) heart failure: Secondary | ICD-10-CM

## 2021-09-10 DIAGNOSIS — J9611 Chronic respiratory failure with hypoxia: Secondary | ICD-10-CM

## 2021-09-10 DIAGNOSIS — D696 Thrombocytopenia, unspecified: Secondary | ICD-10-CM

## 2021-09-10 DIAGNOSIS — R634 Abnormal weight loss: Secondary | ICD-10-CM

## 2021-09-10 MED ORDER — QUVIVIQ 25 MG PO TABS: 1.0000 | ORAL_TABLET | Freq: Every evening | ORAL | 0 refills | Status: DC
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
## 2021-09-10 NOTE — Patient Instructions (Addendum)
[name] -  ? ?Pleasure to see you. I am sorry to hear about all of the chaos that is occurring at home. ? ?We can try to add the Quviviq for sleep. Use with extreme caution given the other sedating medications that you are on. Do not take with valium or xanax.  ? ?I have ordered a CT of the chest, abdomen,and pelvis to rule out any abnormalities within that may contribute to low platelets, history of weight loss, and other concerns. I will let you know when results are available. ? ?I understand that you are seeking a second opinion from [facility] for the hematology concerns, which I think is fine. I would encourage you to proceed with the bone marrow biopsy that Dr. Alen Blew recommended, though. ? ?Call me with any concerns, ? ?Thank you, ? ?Rich  ? ? ? ?If you have lab work done today you will be contacted with your lab results within the next 2 weeks.  If you have not heard from [place] then please contact us. The fastest way to get your results is to register for My Chart. ? ? ?IF you received an x-ray today, you will receive an invoice from [facility]. Please contact [facility] at [phone] with questions or concerns regarding your invoice.  ? ?IF you received labwork today, you will receive an invoice from [facility]. Please contact [facility] at [phone] with questions or concerns regarding your invoice.  ? ?Our billing staff will not be able to assist you with questions regarding bills from these companies. ? ?You will be contacted with the lab results as soon as they are available. The fastest way to get your results is to activate your My Chart account. Instructions are located on the last page of this paperwork. If you have not heard from [place] regarding the results in 2 weeks, please contact this office. ?  ? ? ?
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
## 2021-09-10 NOTE — Telephone Encounter (Signed)
I think that's a smart idea for him ? ?Thanks, ? ?Rich

## 2021-09-10 NOTE — Telephone Encounter (Signed)
Pt states that he is going to go to out pt rehab  ?

## 2021-09-11 ENCOUNTER — Ambulatory Visit (INDEPENDENT_AMBULATORY_CARE_PROVIDER_SITE_OTHER): Payer: Self-pay | Admitting: Licensed Clinical Social Worker

## 2021-09-11 ENCOUNTER — Telehealth (HOSPITAL_COMMUNITY): Payer: Self-pay | Admitting: Licensed Clinical Social Worker

## 2021-09-11 DIAGNOSIS — F1320 Sedative, hypnotic or anxiolytic dependence, uncomplicated: Secondary | ICD-10-CM

## 2021-09-11 DIAGNOSIS — F1290 Cannabis use, unspecified, uncomplicated: Secondary | ICD-10-CM

## 2021-09-11 DIAGNOSIS — F1120 Opioid dependence, uncomplicated: Secondary | ICD-10-CM
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
## 2021-09-11 NOTE — Telephone Encounter (Signed)
Therapist calls the client and completes client identification. The client is at the wrong facility across the street; however, the therapist, again, provides the client with the address and location of this appointment.  ? ?Adam Phenix, MA, LCSW, [place], LCAS ?

## 2021-09-11 NOTE — Telephone Encounter (Signed)
Therapist calls the client's brother to obtain collateral information. His brother says that the client has four or five different drug suppliers. He says that his brother has not worked in 31 years and never owned major companies but did have some individual businesses at some point in the past. He says that his brother never owned several [employer] as he claimed and was never a Associate Professor. His brother says that his parents did set up a trust to take care of the client. The Power of Attorney that was signed that the client kept showing during the appointment was for the brother to be the Power of Attorney over their mother who passed away. His brother says that the client somehow believes that this Power of Oneta Rack compels him to pay the client's bills. His brother has not spoken to the client in about five months so does not know anything about the text message the client claimed the brother sent claiming that the man who assists him with personal care has a criminal history. His brother states his belief that the client is "paranoid" and "psychotic."  ? ?He say that he plans on doing an intervention to try and get the client to go to [facility] for detox and residential treatment. He says that there are funds in the trust to pay for this and that the reason that the client does not have BC/BS is that the client let the policy lapse. He also says that he knows nothing about the client's ex being put out of the home and had nothing to do with this.  ? ?The therapist informs the brother that the client indicated that indicated that his power was about to be cut off. He also says that he does not know why the client believes that he, the brother, has anything to do with compelling him to attend an SA IOP and notes that there is no "money involved" as the client alleges.  ? ?The brother asks that the therapist not tell the client that he provided this information. The therapist will inform  the client that a HLOC is recommended beyond SA IOP and inform Dr. Casimiro Needle as well. The brother indicates that he plans on doing an intervention to get the client to [facility].  ? ?The therapist provides the brother with his direct contact number which is [phone]. ? ?Adam Phenix, MA, LCSW, [place], LCAS ?

## 2021-09-12 ENCOUNTER — Telehealth (HOSPITAL_COMMUNITY): Payer: Self-pay | Admitting: Licensed Clinical Social Worker

## 2021-09-12 NOTE — Telephone Encounter (Signed)
The therapist attempts to reach the client by phone this morning to discuss treatment recommendations for a HLOC and options available in light of the fact that his BC/BS apparently termed as of 06/11/21. The therapist is unable to leave a HIPAA-compliant voicemail as the client's voicemail box is currently full. The therapist will try to reach the client once more later today.  ? ?Adam Phenix, MA, LCSW, [place], LCAS ?

## 2021-09-12 NOTE — Telephone Encounter (Signed)
Therapist calls Dr. Casimiro Needle asking him to call this therapist back regarding this patient to discuss this therapist's conclusions based on the CCA from 09/11/21.  ? ?Adam Phenix, MA, LCSW, [place], LCAS ?
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
## 2021-09-15 ENCOUNTER — Telehealth (HOSPITAL_COMMUNITY): Payer: Self-pay | Admitting: Licensed Clinical Social Worker

## 2021-09-15 ENCOUNTER — Telehealth: Payer: Self-pay | Admitting: *Deleted

## 2021-09-15 ENCOUNTER — Encounter: Payer: Self-pay | Admitting: Registered Nurse

## 2021-09-15 ENCOUNTER — Telehealth (HOSPITAL_COMMUNITY): Payer: Self-pay | Admitting: *Deleted

## 2021-09-15 NOTE — Telephone Encounter (Signed)
Dr.Jerry Plovsky left vm requesting a return call from [place] today after 3:30pm to discuss pt.  ? ?Call back # [phone] ? ?Routed to [place] ?

## 2021-09-15 NOTE — Telephone Encounter (Signed)
[name] left a message requesting a referral to [facility] for a second opinion regarding his polycythemia. ?

## 2021-09-15 NOTE — Progress Notes (Signed)
Patient seen today for wound recheck. Healing appears improved per SK. Dr. Caryl Comes educated patient to continue to wash wound with warm soap and water with clean wash cloth and return 10/07/21 for wound recheck. Patient voiced understanding.

## 2021-09-15 NOTE — Telephone Encounter (Signed)
The therapist receives a return call from Dr. Casimiro Needle asking that the therapist call him directly on his cell about this client. The therapist calls Dr. Casimiro Needle reviewing the client's assessment and recommendations for IP detox and residential treatment. Dr. Casimiro Needle says that he is going to attempt to get the client to sign a Release of Information so he can speak to him about paying for the client to go to inpatient detox and treatment.  ? ?Adam Phenix, MA, LCSW, [place], LCAS ?

## 2021-09-16 ENCOUNTER — Telehealth: Payer: Self-pay | Admitting: Registered Nurse

## 2021-09-16 DIAGNOSIS — Z79899 Other long term (current) drug therapy: Secondary | ICD-10-CM | POA: Insufficient documentation

## 2021-09-16 DIAGNOSIS — E878 Other disorders of electrolyte and fluid balance, not elsewhere classified: Secondary | ICD-10-CM | POA: Insufficient documentation

## 2021-09-16 DIAGNOSIS — Z7902 Long term (current) use of antithrombotics/antiplatelets: Secondary | ICD-10-CM | POA: Insufficient documentation

## 2021-09-16 DIAGNOSIS — Z950 Presence of cardiac pacemaker: Secondary | ICD-10-CM | POA: Insufficient documentation

## 2021-09-16 DIAGNOSIS — K625 Hemorrhage of anus and rectum: Secondary | ICD-10-CM | POA: Insufficient documentation

## 2021-09-16 DIAGNOSIS — E876 Hypokalemia: Secondary | ICD-10-CM | POA: Insufficient documentation

## 2021-09-16 DIAGNOSIS — E871 Hypo-osmolality and hyponatremia: Secondary | ICD-10-CM | POA: Insufficient documentation

## 2021-09-16 DIAGNOSIS — Z7982 Long term (current) use of aspirin: Secondary | ICD-10-CM | POA: Insufficient documentation

## 2021-09-16 NOTE — Telephone Encounter (Signed)
Based on this, I would advise him to proceed to ED. I have advised him of this before and he has refused.  ? ?Thanks, ? ?Rich

## 2021-09-16 NOTE — Telephone Encounter (Signed)
Chief Complaint Blood In Stool ?Reason for Call Symptomatic / Request for Health Information ?Initial Comment Caller states cant defecate properly, states he has ?tried laxatives,remedies and all he can stoll is ?blood. states anus pain, no fever. ?Translation No ?Nurse Assessment ?Nurse: Sheppard Plumber, RN, Estill Bamberg Date/Time (Eastern Time): 09/15/2021 3:35:58 PM ?Confirm and document reason for call. If ?symptomatic, describe symptoms. ?---caller states he is having trouble having a BM. ?passing blood for 2 days. ?Does the patient have any new or worsening ?symptoms? ---Yes ?Will a triage be completed? ---Yes ?Related visit to physician within the last 2 weeks? ---Yes ?Does the PT have any chronic conditions? (i.e. ?diabetes, asthma, this includes High risk factors for ?pregnancy, etc.) ?---Yes ?List chronic conditions. ---defibrillator, CHF, testicular CA, COPD ?Is this a behavioral health or substance abuse call? ---No ?Guidelines ?Guideline Title Affirmed Question Affirmed Notes Nurse Date/Time (Eastern ?Time) ?Constipation Patient sounds very ?sick or weak to the ?triager ?[place], RN, ?Estill Bamberg ?09/15/2021 3:50:50 PM ?Disp. Time (Eastern ?Time) Disposition Final User ?09/15/2021 3:39:40 PM Attempt made - message left [place], RN, Estill Bamberg ?PLEASE NOTE: All timestamps contained within this report are represented as [place] Standard Time. ?CONFIDENTIALTY NOTICE: This fax transmission is intended only for the addressee. It contains information that is legally privileged, confidential or ?otherwise protected from use or disclosure. If you are not the intended recipient, you are strictly prohibited from reviewing, disclosing, copying using ?or disseminating any of this information or taking any action in reliance on or regarding this information. If you have received this fax in error, please ?notify us immediately by telephone so that we can arrange for its return to [place]. Phone: [phone], Toll-Free: [phone], Fax:  [phone] ?Page: 2 of 2 ?Call Id: [id number] ?09/15/2021 3:52:11 PM Go to ED Now (or PCP triage) Yes [place], RN, Estill Bamberg ?Caller Disagree/Comply Disagree ?Caller Understands Yes ?PreDisposition Call Doctor ?Care Advice Given Per Guideline ?GO TO ED NOW (OR PCP TRIAGE): * IF NO PCP (PRIMARY CARE PROVIDER) SECOND-LEVEL TRIAGE: You need to ?be seen within the next hour. Go to the [facility] [place] at _____________ [employer] as soon as you can. CARE ADVICE given per ?Constipation (Adult) guideline. ?Comments ?User: Rozelle Logan, RN Date/Time Eilene Ghazi Time): 09/15/2021 3:52:10 PM ?c/o numbness is groin and rectal. cannot pass stool, cannot void. says when trying to pass stool has blood on ?tissue. adivsed to go to ER. refused and wants to speak to pcp ?Referrals ?GO TO FACILITY REFUSED ?

## 2021-09-17 ENCOUNTER — Telehealth: Payer: Self-pay | Admitting: Registered Nurse

## 2021-09-17 ENCOUNTER — Emergency Department (HOSPITAL_COMMUNITY)
Admission: EM | Admit: 2021-09-17 | Discharge: 2021-09-17 | Disposition: A | Discharge status: Home / Self Care | Payer: Self-pay | Attending: Emergency Medicine | Admitting: Emergency Medicine

## 2021-09-17 ENCOUNTER — Other Ambulatory Visit: Payer: Self-pay

## 2021-09-17 ENCOUNTER — Encounter (HOSPITAL_COMMUNITY): Payer: Self-pay | Admitting: Emergency Medicine

## 2021-09-17 DIAGNOSIS — K625 Hemorrhage of anus and rectum: Secondary | ICD-10-CM

## 2021-09-17 LAB — COMPREHENSIVE METABOLIC PANEL
ALT: 33 U/L (ref 0–44)
AST: 55 U/L — ABNORMAL HIGH (ref 15–41)
Albumin: 3.6 g/dL (ref 3.5–5.0)
Alkaline Phosphatase: 69 U/L (ref 38–126)
Anion gap: 9 (ref 5–15)
BUN: 19 mg/dL (ref 8–23)
CO2: 26 mmol/L (ref 22–32)
Calcium: 8.6 mg/dL — ABNORMAL LOW (ref 8.9–10.3)
Chloride: 96 mmol/L — ABNORMAL LOW (ref 98–111)
Creatinine, Ser: 1.13 mg/dL (ref 0.61–1.24)
GFR, Estimated: >60 mL/min (ref >60)
Glucose, Bld: 117 mg/dL — ABNORMAL HIGH (ref 70–99)
Potassium: 3.3 mmol/L — ABNORMAL LOW (ref 3.5–5.1)
Sodium: 131 mmol/L — ABNORMAL LOW (ref 135–145)
Total Bilirubin: 0.4 mg/dL (ref 0.3–1.2)
Total Protein: 6.4 g/dL — ABNORMAL LOW (ref 6.5–8.1)

## 2021-09-17 LAB — PROTIME-INR
INR: 1.1 (ref 0.8–1.2)
Prothrombin Time: 14.3 seconds (ref 11.4–15.2)

## 2021-09-17 LAB — CBC WITH DIFFERENTIAL/PLATELET
Abs Immature Granulocytes: 0.02 10*3/uL (ref 0.00–0.07)
Basophils Absolute: 0 10*3/uL (ref 0.0–0.1)
Basophils Relative: 0 %
Eosinophils Absolute: 0.1 10*3/uL (ref 0.0–0.5)
Eosinophils Relative: 1 %
HCT: 41.9 % (ref 39.0–52.0)
Hemoglobin: 14.7 g/dL (ref 13.0–17.0)
Immature Granulocytes: 0 %
Lymphocytes Relative: 23 %
Lymphs Abs: 1.9 10*3/uL (ref 0.7–4.0)
MCH: 31.8 pg (ref 26.0–34.0)
MCHC: 35.1 g/dL (ref 30.0–36.0)
MCV: 90.7 fL (ref 80.0–100.0)
Monocytes Absolute: 0.7 10*3/uL (ref 0.1–1.0)
Monocytes Relative: 8 %
Neutro Abs: 5.6 10*3/uL (ref 1.7–7.7)
Neutrophils Relative %: 68 %
Platelets: 159 10*3/uL (ref 150–400)
RBC: 4.62 MIL/uL (ref 4.22–5.81)
RDW: 12.6 % (ref 11.5–15.5)
WBC: 8.3 10*3/uL (ref 4.0–10.5)
nRBC: 0 % (ref 0.0–0.2)

## 2021-09-17 LAB — TYPE AND SCREEN
ABO/RH(D): A POS
Antibody Screen: NEGATIVE

## 2021-09-17 LAB — APTT: aPTT: 30 seconds (ref 24–36)

## 2021-09-17 NOTE — Discharge Instructions (Signed)
Your hemoglobin remains unchanged.  I do not see any significant change to your EKG.  Please return for worsening bleeding if you feel like you are going to pass out.  Please follow-up with a gastroenterologist in the office. ?

## 2021-09-17 NOTE — ED Triage Notes (Signed)
Pt reported to ED triage with c/o "bleeding pure blood" from rectum. States he has had seven enemas d/t constipation. States onset of rectal bleeding began three days ago.  ?

## 2021-09-17 NOTE — Telephone Encounter (Signed)
Chief Complaint Rectal Bleeding ?Reason for Call Request to speak to Physician ?Initial Comment Caller states at ER now BP 126/66 ,rectal ?bleeding, he wants the Dr ?Translation No ?Nurse Assessment ?Nurse: Anabel Bene, RN, Anderson [place] Date/Time Eilene Ghazi Time): 09/17/2021 12:17:05 AM ?Confirm and document reason for call. If ?symptomatic, describe symptoms. ?---Caller states at ER now BP 126/66, rectal bleeding - ?has been very constipated. Just wants the MD to know. ?Does the patient have any new or worsening ?symptoms? ---No ?Disp. Time (Eastern ?Time) Disposition Final User ?09/17/2021 12:24:02 AM Paged On Call back to Call Center [place], RN, Anderson Malta ?09/17/2021 12:32:13 AM Paged On Call back to Call Center [place], RN, Anderson Malta ?09/17/2021 12:42:07 AM Paged On Call back to Call Center [place], RN, Anderson Malta ?09/17/2021 12:54:47 AM Call Completed Anabel Bene, RN, Anderson [place] ?09/17/2021 12:56:42 AM Clinical Call Yes Anabel Bene, RN, Anderson [place] ?Comments ?User: Rosanna Randy, RN Date/Time Eilene Ghazi Time): 09/17/2021 12:53:59 AM ?Caller demanding I notify the doctor that he is in the ED - because he has "built this hospital". Attempted on-call ?x3 - with no success. Charge RN aware. Returned call to pt who became upset. Told pt to stay in the ED and let ?them see him and have a good night. Charge RN aware of entire situation. ?PLEASE NOTE: All timestamps contained within this report are represented as [place] Standard Time. ?CONFIDENTIALTY NOTICE: This fax transmission is intended only for the addressee. It contains information that is legally privileged, confidential or ?otherwise protected from use or disclosure. If you are not the intended recipient, you are strictly prohibited from reviewing, disclosing, copying using ?or disseminating any of this information or taking any action in reliance on or regarding this information. If you have received this fax in error, please ?notify us immediately by telephone so that we can arrange for  its return to [place]. Phone: [phone], Toll-Free: [phone], Fax: [phone] ?Page: 2 of 2 ?Call Id: [id number] ?Paging ?DoctorName Phone DateTime Result/ ?Outcome Message Type Notes ?Filomena Jungling [phone] ?09/17/2021 ?12:24:02 ?AM ?Called On Call ?Provider - Left ?Message ?Doctor Paged ?Filomena Jungling [phone] ?09/17/2021 ?12:32:13 ?AM ?Called On Call ?Provider - Left ?Message ?Doctor Paged ?Filomena Jungling [phone] ?09/17/2021 ?12:42:07 ?AM ?Called On Call ?Provider - Left ?Message ?Doctor Paged ?Mitchel Honour, [place] ?09/17/2021 ?12:56:27 ?AM ?Unable to ?Reach on call - ?Max Attempts ?Message Result ?

## 2021-09-17 NOTE — ED Provider Notes (Signed)
[name] Provider Note   CSN: [id number] Arrival date & time: 09/16/21  2324     History  Chief Complaint  Patient presents with   Rectal Bleeding    [name] is a 61 y.o. male.  61 yo M with a chief complaints of bright red blood per rectum.  This has been going on for the past 3 days or so.  He has been using multiple stimulants to try and get him to use the bathroom.  He feels like maybe he is got everything out at this point.  He had called his family doctor who suggested he come here for evaluation.   Rectal Bleeding     Home Medications Prior to Admission medications   Medication Sig Start Date End Date Taking? Authorizing Provider  albuterol (VENTOLIN HFA) 108 (90 Base) MCG/ACT inhaler Inhale 2 puffs into the lungs every 6 (six) hours as needed for wheezing or shortness of breath. 10/01/20   [name], NP  ALPRAZolam Duanne Moron) 1 MG tablet Take 1 mg by mouth 4 (four) times daily.     [provider]  aspirin 325 MG tablet Take 325 mg by mouth daily.    [provider]  carvedilol (COREG) 25 MG tablet Take 25 mg by mouth 2 (two) times daily with a meal.    [provider]  clopidogrel (PLAVIX) 75 MG tablet TAKE ONE TABLET BY MOUTH DAILY 12/23/20   [name], NP  cyanocobalamin (,VITAMIN B-12,) 1000 MCG/ML injection Inject 1 mL (1,000 mcg total) into the muscle every 30 (thirty) days. 10/03/20   [name], NP  dapagliflozin propanediol (FARXIGA) 10 MG TABS tablet Take 1 tablet (10 mg total) by mouth daily before breakfast. 10/01/20   [name], NP  Daridorexant HCl (QUVIVIQ) 25 MG TABS Take 1 tablet by mouth at bedtime. 09/10/21   [name], NP  diazepam (VALIUM) 10 MG tablet Take 20 mg by mouth every 12 (twelve) hours as needed for anxiety.  06/04/17   [provider]  erythromycin ophthalmic ointment Place 1 application into the right eye at bedtime. Patient taking  differently: Place 1 application into the right eye at bedtime as needed (irritation). 03/03/19   Jacelyn Pi, [name], MD  ezetimibe (ZETIA) 10 MG tablet TAKE 1 TABLET EACH DAY. 10/01/20   [name], NP  furosemide (LASIX) 20 MG tablet Take 1 tablet (20 mg total) by mouth daily. Patient taking differently: Take 20 mg by mouth daily as needed for edema. 10/24/20   [name], NP  ibuprofen (ADVIL,MOTRIN) 200 MG tablet Take 800 mg by mouth every 6 (six) hours as needed for moderate pain.    [provider]  lactulose (CHRONULAC) 10 GM/15ML solution Take 15 mLs (10 g total) by mouth 2 (two) times daily as needed for mild constipation. 10/01/20   [name], NP  lisinopril (PRINIVIL,ZESTRIL) 5 MG tablet Take 5 mg by mouth daily. 07/20/18   [provider]  methadone (METHADOSE) 40 MG disintegrating tablet Take 200 mg by mouth daily. [facility]    [provider]  methocarbamol (ROBAXIN) 750 MG tablet TAKE 1 OR 2 TABLETS THREE TIMES A DAY AS NEEDED. 10/01/20   [name], NP  naproxen (NAPROSYN) 500 MG tablet Take 1 tablet (500 mg total) by mouth 2 (two) times daily as needed for moderate pain. 01/09/20   Jacelyn Pi, [name], MD  nitroGLYCERIN (NITROLINGUAL) 0.4 MG/SPRAY spray Place 2 sprays under  the tongue every 5 (five) minutes x 3 doses as needed for chest pain.  03/20/15   [provider]  ondansetron (ZOFRAN-ODT) 8 MG disintegrating tablet Take 1 tablet (8 mg total) by mouth every 8 (eight) hours as needed for nausea or vomiting. 10/01/20   [name], NP  pantoprazole (PROTONIX) 40 MG tablet Take 1 tablet (40 mg total) by mouth daily. 10/01/20   [name], NP  polyethylene glycol powder (GLYCOLAX/MIRALAX) 17 GM/SCOOP powder Take 17 g by mouth 2 (two) times daily as needed for mild constipation.    [provider]  rosuvastatin (CRESTOR) 40 MG tablet Take 1 tablet (40 mg total) by mouth every evening. 10/01/20    [name], NP  sildenafil (VIAGRA) 100 MG tablet Take 0.5 tablets (50 mg total) by mouth daily as needed for erectile dysfunction. Patient taking differently: Take 100 mg by mouth daily as needed for erectile dysfunction. 09/30/20   [name], NP  spironolactone (ALDACTONE) 25 MG tablet TAKE (1/2) TABLET DAILY. Patient taking differently: Take 25 mg by mouth daily. 10/01/20   [name], NP  tamsulosin (FLOMAX) 0.4 MG CAPS capsule Take 1 capsule (0.4 mg total) by mouth daily after breakfast. 10/03/20   [name], NP  Testosterone Cypionate 200 MG/ML KIT Inject 200 mg into the muscle every 14 (fourteen) days.    [provider]  torsemide (DEMADEX) 20 MG tablet TAKE ONE TABLET DAILY AS NEEDED FOR SWELLING IN LEGS. Patient taking differently: Take 20 mg by mouth 2 (two) times daily. 06/06/21   [name], NP  traZODone (DESYREL) 100 MG tablet Take 1 tablet (100 mg total) by mouth at bedtime. 10/01/20   [name], NP  triamcinolone (NASACORT AQ) 55 MCG/ACT AERO nasal inhaler Place 1 spray into the nose daily. 10/01/20   [name], NP  zolpidem (AMBIEN) 10 MG tablet Take 10 mg by mouth at bedtime as needed for sleep. 07/11/18   [provider]      Allergies    Darvocet [propoxyphene n-acetaminophen]    Review of Systems   Review of Systems  Gastrointestinal:  Positive for hematochezia.   Physical Exam Updated Vital Signs BP 132/86 (BP Location: Left Arm)    Pulse 69    Temp 98.3 F (36.8 C) (Oral)    Resp 17    Ht 6' (1.829 m)    Wt 83.9 kg    SpO2 100%    BMI 25.09 kg/m  Physical Exam Vitals and nursing note reviewed.  Constitutional:      Appearance: He is well-developed.  HENT:     Head: Normocephalic and atraumatic.  Eyes:     Pupils: Pupils are equal, round, and reactive to light.  Neck:     Vascular: No JVD.  Cardiovascular:     Rate and Rhythm: Normal rate and regular rhythm.     Heart sounds: No murmur heard.   No  friction rub. No gallop.  Pulmonary:     Effort: No respiratory distress.     Breath sounds: No wheezing.  Abdominal:     General: There is no distension.     Tenderness: There is no abdominal tenderness. There is no guarding or rebound.  Genitourinary:    Comments: No hemorrhoids or fissures.  No blood in the vault.  No stool. Musculoskeletal:        General: Normal range of motion.     Cervical back: Normal range of motion and neck supple.  Skin:  Coloration: Skin is not pale.     Findings: No rash.  Neurological:     Mental Status: He is alert and oriented to person, place, and time.  Psychiatric:        Behavior: Behavior normal.    ED Results / Procedures / Treatments   Labs (all labs ordered are listed, but only abnormal results are displayed) Labs Reviewed  COMPREHENSIVE METABOLIC PANEL - Abnormal; Notable for the following components:      Result Value   Sodium 131 (*)    Potassium 3.3 (*)    Chloride 96 (*)    Glucose, Bld 117 (*)    Calcium 8.6 (*)    Total Protein 6.4 (*)    AST 55 (*)    All other components within normal limits  CBC WITH DIFFERENTIAL/PLATELET  PROTIME-INR  APTT  TYPE AND SCREEN  ABO/RH    EKG None  Radiology No results found.  Procedures Procedures    Medications Ordered in ED Medications - No data to display  ED Course/ Medical Decision Making/ A&P                           Medical Decision Making Amount and/or Complexity of Data Reviewed ECG/medicine tests: ordered.   61 yo M with a chief complaints of bright red blood per rectum.  This been going on for about 3 days.  Hemoglobin is unchanged from prior.  Mild hyponatremia hypokalemia and hypochloremia.  The patient is very concerned about his heart disease tells me that he has recently had a pacemaker defibrillator placed and would like an EKG performed.  He denies any obvious cardiac complaints.  EKG without obvious concerning finding.  We will discharge the patient  home.  PCP GI and cardiology follow-up.  7:34 AM:  I have discussed the diagnosis/risks/treatment options with the patient.  Evaluation and diagnostic testing in the emergency department does not suggest an emergent condition requiring admission or immediate intervention beyond what has been performed at this time.  They will follow up with  GI, PCP, Cards. We also discussed returning to the ED immediately if new or worsening sx occur. We discussed the sx which are most concerning (e.g., sudden worsening pain, fever, inability to tolerate by mouth, worsening bleeding, syncope, fatigue) that necessitate immediate return. Medications administered to the patient during their visit and any new prescriptions provided to the patient are listed below.  Medications given during this visit Medications - No data to display   The patient appears reasonably screen and/or stabilized for discharge and I doubt any other medical condition or other [facility] requiring further screening, evaluation, or treatment in the ED at this time prior to discharge.          Final Clinical Impression(s) / ED Diagnoses Final diagnoses:  BRBPR (bright red blood per rectum)    Rx / DC Orders ED Discharge Orders     None         Deno Etienne, DO 09/17/21 [phone]

## 2021-09-17 NOTE — ED Provider Triage Note (Signed)
Emergency Medicine Provider Triage Evaluation Note ? ?[name] , a 61 y.o. male  was evaluated in triage.  Pt complains of rectal bleeding and constipation.  Reports that he has not had a bowel movement in the last 3 days.  Has reported trying multiple enemas, mineral oil, Linzess and Farxiga with no improvement in his constipation.  States that he has been having rectal bleeding over the last 3 days as well.  Reports noting bright red blood coming from his rectum.  Patient is unable to specify the amount. ? ?Denies any lightheadedness, dizziness, syncope, fatigue, rectal pain, abdominal pain, nausea, vomiting ? ?Patient is on Plavix. ? ?Review of Systems  ?Positive: Rectal bleeding, constipation ?Negative: See above ? ?Physical Exam  ?BP 112/66 (BP Location: Right Arm)   Pulse 81   Temp 98.4 ?F (36.9 ?C) (Oral)   Resp 19   Ht 6' (1.829 m)   Wt 83.9 kg   SpO2 99%   BMI 25.09 kg/m?  ?Gen:   Awake, no distress   ?Resp:  Normal effort  ?MSK:   Moves extremities without difficulty  ?Other:  Abdomen soft, nondistended, nontender with no guarding or rebound tenderness.  +2 radial pulse bilaterally ? ?Medical Decision Making  ?Medically screening exam initiated at 1:02 AM.  Appropriate orders placed.  [name] was informed that the remainder of the evaluation will be completed by another provider, this initial triage assessment does not replace that evaluation, and the importance of remaining in the ED until their evaluation is complete. ? ? ?  ?Loni Beckwith, PA-C ?09/17/21 0104 ? ?

## 2021-09-17 NOTE — Telephone Encounter (Signed)
Continue to recommend ER presentation for this patient as he reportedly has blood in stool, urine, and semen. I would be unable to start work up in office for this, particularly given his complex medical history. ? ?Thanks, ? ?Rich

## 2021-09-18 NOTE — Telephone Encounter (Signed)
Attempted to call patient his voicemail is full. ?

## 2021-09-24 NOTE — Telephone Encounter (Signed)
Please advise both messages below ?

## 2021-09-25 ENCOUNTER — Other Ambulatory Visit: Payer: Self-pay | Admitting: Registered Nurse

## 2021-09-26 ENCOUNTER — Inpatient Hospital Stay: Admission: RE | Admit: 2021-09-26 | Payer: Self-pay | Source: Ambulatory Visit

## 2021-09-26 ENCOUNTER — Other Ambulatory Visit: Payer: Self-pay | Admitting: Registered Nurse

## 2021-09-26 ENCOUNTER — Telehealth: Payer: Self-pay | Admitting: Registered Nurse

## 2021-09-26 NOTE — Telephone Encounter (Signed)
Will call for fax number  ?

## 2021-09-26 NOTE — Telephone Encounter (Signed)
Can print his current list and send it - I am not responsible for a lot of the medications of concern on there (methadone, alprazolam, diazepam, zolpidem). ? ?Thanks, ? ?Rich

## 2021-09-26 NOTE — Telephone Encounter (Signed)
Attempted call to [place] no response no VM, I belive you previously expressed concerns previously for what the patient is or isnt taking, can we accurately verify pt medication list at this time? ? ?

## 2021-09-26 NOTE — Telephone Encounter (Signed)
Joelene Millin from [facility] was calling on behalf of the patient to verify prescriptions he is taking.  ?

## 2021-09-29 ENCOUNTER — Telehealth: Payer: Self-pay

## 2021-09-29 ENCOUNTER — Telehealth (HOSPITAL_COMMUNITY): Payer: Self-pay | Admitting: *Deleted

## 2021-09-29 NOTE — Telephone Encounter (Signed)
Returning phone call to Dr. Almyra Free. ? ?No answer,  unable to leave VM to full. ?

## 2021-09-29 NOTE — Telephone Encounter (Signed)
Dr.  Almyra Free called wanting to talk to a nurse about patient device, his call back number is [phone] ?

## 2021-09-29 NOTE — Telephone Encounter (Signed)
Error

## 2021-09-29 NOTE — Telephone Encounter (Signed)
Dr.Hong with [facility] called stating pt had a lot of complaints about his pacemaker. I gave Dr.Hong [facility] office number to contact Dr.Klein.  ?

## 2021-09-30 NOTE — Telephone Encounter (Signed)
Attempted to return call. Phone went straight to VM, unable to leave VM d/t full.  ?

## 2021-10-01 ENCOUNTER — Other Ambulatory Visit: Payer: Self-pay | Admitting: Student

## 2021-10-01 ENCOUNTER — Other Ambulatory Visit: Payer: Self-pay | Admitting: Radiology

## 2021-10-01 NOTE — Telephone Encounter (Signed)
Attempted to return Dr. Almyra Free message. No answer, unable to leave VM.  ?

## 2021-10-02 ENCOUNTER — Ambulatory Visit (HOSPITAL_COMMUNITY): Payer: Self-pay

## 2021-10-02 ENCOUNTER — Ambulatory Visit (HOSPITAL_COMMUNITY): Admission: RE | Admit: 2021-10-02 | Payer: Self-pay | Source: Ambulatory Visit

## 2021-10-07 ENCOUNTER — Ambulatory Visit: Payer: Self-pay

## 2021-10-08 DIAGNOSIS — F10239 Alcohol dependence with withdrawal, unspecified: Secondary | ICD-10-CM | POA: Diagnosis not present

## 2021-10-08 DIAGNOSIS — E872 Acidosis, unspecified: Secondary | ICD-10-CM | POA: Diagnosis not present

## 2021-10-08 DIAGNOSIS — I5022 Chronic systolic (congestive) heart failure: Secondary | ICD-10-CM | POA: Diagnosis not present

## 2021-10-08 DIAGNOSIS — I2510 Atherosclerotic heart disease of native coronary artery without angina pectoris: Secondary | ICD-10-CM | POA: Diagnosis not present

## 2021-10-08 DIAGNOSIS — G928 Other toxic encephalopathy: Secondary | ICD-10-CM | POA: Diagnosis not present

## 2021-10-08 DIAGNOSIS — K7200 Acute and subacute hepatic failure without coma: Secondary | ICD-10-CM | POA: Diagnosis not present

## 2021-10-08 DIAGNOSIS — D6959 Other secondary thrombocytopenia: Secondary | ICD-10-CM | POA: Diagnosis not present

## 2021-10-08 DIAGNOSIS — N179 Acute kidney failure, unspecified: Secondary | ICD-10-CM | POA: Diagnosis not present

## 2021-10-08 DIAGNOSIS — I472 Ventricular tachycardia, unspecified: Secondary | ICD-10-CM | POA: Diagnosis not present

## 2021-10-08 DIAGNOSIS — R4182 Altered mental status, unspecified: Secondary | ICD-10-CM | POA: Diagnosis not present

## 2021-10-08 DIAGNOSIS — J9691 Respiratory failure, unspecified with hypoxia: Secondary | ICD-10-CM | POA: Diagnosis not present

## 2021-10-08 DIAGNOSIS — E119 Type 2 diabetes mellitus without complications: Secondary | ICD-10-CM | POA: Diagnosis not present

## 2021-10-08 DIAGNOSIS — Z743 Need for continuous supervision: Secondary | ICD-10-CM | POA: Diagnosis not present

## 2021-10-08 DIAGNOSIS — G210 Malignant neuroleptic syndrome: Secondary | ICD-10-CM | POA: Diagnosis not present

## 2021-10-08 DIAGNOSIS — J9811 Atelectasis: Secondary | ICD-10-CM | POA: Diagnosis not present

## 2021-10-08 DIAGNOSIS — F1999 Other psychoactive substance use, unspecified with unspecified psychoactive substance-induced disorder: Secondary | ICD-10-CM | POA: Diagnosis not present

## 2021-10-08 DIAGNOSIS — I480 Paroxysmal atrial fibrillation: Secondary | ICD-10-CM | POA: Diagnosis not present

## 2021-10-08 DIAGNOSIS — R29898 Other symptoms and signs involving the musculoskeletal system: Secondary | ICD-10-CM | POA: Diagnosis not present

## 2021-10-08 DIAGNOSIS — F19239 Other psychoactive substance dependence with withdrawal, unspecified: Secondary | ICD-10-CM | POA: Diagnosis not present

## 2021-10-08 DIAGNOSIS — F22 Delusional disorders: Secondary | ICD-10-CM | POA: Diagnosis not present

## 2021-10-08 DIAGNOSIS — I420 Dilated cardiomyopathy: Secondary | ICD-10-CM | POA: Diagnosis not present

## 2021-10-08 DIAGNOSIS — I110 Hypertensive heart disease with heart failure: Secondary | ICD-10-CM | POA: Diagnosis not present

## 2021-10-08 DIAGNOSIS — M6282 Rhabdomyolysis: Secondary | ICD-10-CM | POA: Diagnosis not present

## 2021-10-08 DIAGNOSIS — R443 Hallucinations, unspecified: Secondary | ICD-10-CM | POA: Diagnosis not present

## 2021-10-08 DIAGNOSIS — R569 Unspecified convulsions: Secondary | ICD-10-CM | POA: Diagnosis not present

## 2021-10-09 ENCOUNTER — Telehealth (HOSPITAL_COMMUNITY): Payer: Self-pay

## 2021-10-09 NOTE — Telephone Encounter (Signed)
Received a call from staff at [facility] in [place] to notify Dr. Haroldine Laws that the patient is currently admitted to the ICU. ?

## 2021-10-23 DIAGNOSIS — N401 Enlarged prostate with lower urinary tract symptoms: Secondary | ICD-10-CM | POA: Diagnosis not present

## 2021-10-23 DIAGNOSIS — F1120 Opioid dependence, uncomplicated: Secondary | ICD-10-CM | POA: Diagnosis not present

## 2021-10-23 DIAGNOSIS — Z95810 Presence of automatic (implantable) cardiac defibrillator: Secondary | ICD-10-CM | POA: Diagnosis not present

## 2021-10-23 DIAGNOSIS — R079 Chest pain, unspecified: Secondary | ICD-10-CM | POA: Diagnosis not present

## 2021-10-23 DIAGNOSIS — I214 Non-ST elevation (NSTEMI) myocardial infarction: Secondary | ICD-10-CM | POA: Diagnosis not present

## 2021-10-23 DIAGNOSIS — I2510 Atherosclerotic heart disease of native coronary artery without angina pectoris: Secondary | ICD-10-CM | POA: Diagnosis not present

## 2021-10-23 DIAGNOSIS — Z9989 Dependence on other enabling machines and devices: Secondary | ICD-10-CM | POA: Diagnosis not present

## 2021-10-23 DIAGNOSIS — Z993 Dependence on wheelchair: Secondary | ICD-10-CM | POA: Diagnosis not present

## 2021-10-23 DIAGNOSIS — I110 Hypertensive heart disease with heart failure: Secondary | ICD-10-CM | POA: Diagnosis not present

## 2021-10-23 DIAGNOSIS — M21379 Foot drop, unspecified foot: Secondary | ICD-10-CM | POA: Diagnosis not present

## 2021-10-23 DIAGNOSIS — I10 Essential (primary) hypertension: Secondary | ICD-10-CM | POA: Diagnosis not present

## 2021-10-23 DIAGNOSIS — I509 Heart failure, unspecified: Secondary | ICD-10-CM | POA: Diagnosis not present

## 2021-10-23 DIAGNOSIS — R338 Other retention of urine: Secondary | ICD-10-CM | POA: Diagnosis not present

## 2021-10-23 DIAGNOSIS — R339 Retention of urine, unspecified: Secondary | ICD-10-CM | POA: Diagnosis not present

## 2021-10-23 DIAGNOSIS — Z9861 Coronary angioplasty status: Secondary | ICD-10-CM | POA: Diagnosis not present

## 2021-10-23 DIAGNOSIS — I5022 Chronic systolic (congestive) heart failure: Secondary | ICD-10-CM | POA: Diagnosis not present

## 2021-10-23 DIAGNOSIS — I429 Cardiomyopathy, unspecified: Secondary | ICD-10-CM | POA: Diagnosis not present

## 2021-10-23 DIAGNOSIS — Z888 Allergy status to other drugs, medicaments and biological substances status: Secondary | ICD-10-CM | POA: Diagnosis not present

## 2021-10-23 DIAGNOSIS — Z8249 Family history of ischemic heart disease and other diseases of the circulatory system: Secondary | ICD-10-CM | POA: Diagnosis not present

## 2021-11-04 DIAGNOSIS — R531 Weakness: Secondary | ICD-10-CM | POA: Diagnosis not present

## 2021-11-04 DIAGNOSIS — Z9181 History of falling: Secondary | ICD-10-CM | POA: Diagnosis not present

## 2021-11-04 DIAGNOSIS — M6281 Muscle weakness (generalized): Secondary | ICD-10-CM | POA: Diagnosis not present

## 2021-11-04 DIAGNOSIS — R260 Ataxic gait: Secondary | ICD-10-CM | POA: Diagnosis not present

## 2021-11-07 DIAGNOSIS — Z9181 History of falling: Secondary | ICD-10-CM | POA: Diagnosis not present

## 2021-11-07 DIAGNOSIS — R260 Ataxic gait: Secondary | ICD-10-CM | POA: Diagnosis not present

## 2021-11-07 DIAGNOSIS — M6281 Muscle weakness (generalized): Secondary | ICD-10-CM | POA: Diagnosis not present

## 2021-11-07 DIAGNOSIS — R531 Weakness: Secondary | ICD-10-CM | POA: Diagnosis not present

## 2021-11-11 DIAGNOSIS — M6281 Muscle weakness (generalized): Secondary | ICD-10-CM | POA: Diagnosis not present

## 2021-11-11 DIAGNOSIS — R260 Ataxic gait: Secondary | ICD-10-CM | POA: Diagnosis not present

## 2021-11-11 DIAGNOSIS — Z9181 History of falling: Secondary | ICD-10-CM | POA: Diagnosis not present

## 2021-11-11 DIAGNOSIS — R531 Weakness: Secondary | ICD-10-CM | POA: Diagnosis not present

## 2021-11-13 DIAGNOSIS — R260 Ataxic gait: Secondary | ICD-10-CM | POA: Diagnosis not present

## 2021-11-13 DIAGNOSIS — R531 Weakness: Secondary | ICD-10-CM | POA: Diagnosis not present

## 2021-11-13 DIAGNOSIS — Z9181 History of falling: Secondary | ICD-10-CM | POA: Diagnosis not present

## 2021-11-13 DIAGNOSIS — M6281 Muscle weakness (generalized): Secondary | ICD-10-CM | POA: Diagnosis not present

## 2021-11-15 DIAGNOSIS — I110 Hypertensive heart disease with heart failure: Secondary | ICD-10-CM | POA: Diagnosis not present

## 2021-11-15 DIAGNOSIS — N400 Enlarged prostate without lower urinary tract symptoms: Secondary | ICD-10-CM | POA: Diagnosis not present

## 2021-11-15 DIAGNOSIS — F1120 Opioid dependence, uncomplicated: Secondary | ICD-10-CM | POA: Diagnosis not present

## 2021-11-15 DIAGNOSIS — F23 Brief psychotic disorder: Secondary | ICD-10-CM | POA: Diagnosis not present

## 2021-11-15 DIAGNOSIS — Z8547 Personal history of malignant neoplasm of testis: Secondary | ICD-10-CM | POA: Diagnosis not present

## 2021-11-15 DIAGNOSIS — F1121 Opioid dependence, in remission: Secondary | ICD-10-CM | POA: Diagnosis not present

## 2021-11-15 DIAGNOSIS — F17210 Nicotine dependence, cigarettes, uncomplicated: Secondary | ICD-10-CM | POA: Diagnosis not present

## 2021-11-15 DIAGNOSIS — R7611 Nonspecific reaction to tuberculin skin test without active tuberculosis: Secondary | ICD-10-CM | POA: Diagnosis not present

## 2021-11-15 DIAGNOSIS — N179 Acute kidney failure, unspecified: Secondary | ICD-10-CM | POA: Diagnosis not present

## 2021-11-15 DIAGNOSIS — Z608 Other problems related to social environment: Secondary | ICD-10-CM | POA: Diagnosis not present

## 2021-11-15 DIAGNOSIS — F1320 Sedative, hypnotic or anxiolytic dependence, uncomplicated: Secondary | ICD-10-CM | POA: Diagnosis not present

## 2021-11-15 DIAGNOSIS — F250 Schizoaffective disorder, bipolar type: Secondary | ICD-10-CM | POA: Diagnosis not present

## 2021-11-15 DIAGNOSIS — Z8611 Personal history of tuberculosis: Secondary | ICD-10-CM | POA: Diagnosis not present

## 2021-11-15 DIAGNOSIS — Z20822 Contact with and (suspected) exposure to covid-19: Secondary | ICD-10-CM | POA: Diagnosis not present

## 2021-11-15 DIAGNOSIS — F17290 Nicotine dependence, other tobacco product, uncomplicated: Secondary | ICD-10-CM | POA: Diagnosis not present

## 2021-11-15 DIAGNOSIS — J449 Chronic obstructive pulmonary disease, unspecified: Secondary | ICD-10-CM | POA: Diagnosis not present

## 2021-11-15 DIAGNOSIS — Z950 Presence of cardiac pacemaker: Secondary | ICD-10-CM | POA: Diagnosis not present

## 2021-11-15 DIAGNOSIS — I5022 Chronic systolic (congestive) heart failure: Secondary | ICD-10-CM | POA: Diagnosis not present

## 2021-11-15 DIAGNOSIS — Z0000 Encounter for general adult medical examination without abnormal findings: Secondary | ICD-10-CM | POA: Diagnosis not present

## 2021-11-21 DIAGNOSIS — Z0000 Encounter for general adult medical examination without abnormal findings: Secondary | ICD-10-CM | POA: Diagnosis not present

## 2021-11-24 ENCOUNTER — Encounter: Payer: BC Managed Care – PPO | Admitting: Internal Medicine

## 2021-11-28 DIAGNOSIS — R269 Unspecified abnormalities of gait and mobility: Secondary | ICD-10-CM | POA: Diagnosis not present

## 2021-12-01 ENCOUNTER — Ambulatory Visit: Payer: Self-pay | Admitting: Registered Nurse

## 2021-12-02 ENCOUNTER — Ambulatory Visit: Payer: Self-pay | Admitting: Registered Nurse

## 2021-12-03 ENCOUNTER — Encounter: Payer: Self-pay | Admitting: Registered Nurse

## 2021-12-03 DIAGNOSIS — F1120 Opioid dependence, uncomplicated: Secondary | ICD-10-CM | POA: Diagnosis not present

## 2021-12-03 DIAGNOSIS — F604 Histrionic personality disorder: Secondary | ICD-10-CM | POA: Diagnosis not present

## 2021-12-03 DIAGNOSIS — F411 Generalized anxiety disorder: Secondary | ICD-10-CM | POA: Diagnosis not present

## 2021-12-03 DIAGNOSIS — F02811 Dementia in other diseases classified elsewhere, unspecified severity, with agitation: Secondary | ICD-10-CM | POA: Diagnosis not present

## 2021-12-10 DIAGNOSIS — Z0000 Encounter for general adult medical examination without abnormal findings: Secondary | ICD-10-CM | POA: Diagnosis not present

## 2021-12-16 DIAGNOSIS — M5459 Other low back pain: Secondary | ICD-10-CM | POA: Diagnosis not present

## 2021-12-16 DIAGNOSIS — R262 Difficulty in walking, not elsewhere classified: Secondary | ICD-10-CM | POA: Diagnosis not present

## 2021-12-16 DIAGNOSIS — M6281 Muscle weakness (generalized): Secondary | ICD-10-CM | POA: Diagnosis not present

## 2021-12-16 DIAGNOSIS — M21371 Foot drop, right foot: Secondary | ICD-10-CM | POA: Diagnosis not present

## 2021-12-19 DIAGNOSIS — M21371 Foot drop, right foot: Secondary | ICD-10-CM | POA: Diagnosis not present

## 2021-12-19 DIAGNOSIS — M6281 Muscle weakness (generalized): Secondary | ICD-10-CM | POA: Diagnosis not present

## 2021-12-19 DIAGNOSIS — R262 Difficulty in walking, not elsewhere classified: Secondary | ICD-10-CM | POA: Diagnosis not present

## 2021-12-19 DIAGNOSIS — M5459 Other low back pain: Secondary | ICD-10-CM | POA: Diagnosis not present

## 2021-12-19 DIAGNOSIS — E291 Testicular hypofunction: Secondary | ICD-10-CM | POA: Diagnosis not present

## 2021-12-22 DIAGNOSIS — Z0000 Encounter for general adult medical examination without abnormal findings: Secondary | ICD-10-CM | POA: Diagnosis not present

## 2021-12-22 DIAGNOSIS — M47816 Spondylosis without myelopathy or radiculopathy, lumbar region: Secondary | ICD-10-CM | POA: Diagnosis not present

## 2021-12-23 DIAGNOSIS — M6281 Muscle weakness (generalized): Secondary | ICD-10-CM | POA: Diagnosis not present

## 2021-12-23 DIAGNOSIS — M21371 Foot drop, right foot: Secondary | ICD-10-CM | POA: Diagnosis not present

## 2021-12-23 DIAGNOSIS — M5459 Other low back pain: Secondary | ICD-10-CM | POA: Diagnosis not present

## 2021-12-23 DIAGNOSIS — R262 Difficulty in walking, not elsewhere classified: Secondary | ICD-10-CM | POA: Diagnosis not present

## 2021-12-24 DIAGNOSIS — M5459 Other low back pain: Secondary | ICD-10-CM | POA: Diagnosis not present

## 2021-12-24 DIAGNOSIS — R262 Difficulty in walking, not elsewhere classified: Secondary | ICD-10-CM | POA: Diagnosis not present

## 2021-12-24 DIAGNOSIS — M21371 Foot drop, right foot: Secondary | ICD-10-CM | POA: Diagnosis not present

## 2021-12-24 DIAGNOSIS — M6281 Muscle weakness (generalized): Secondary | ICD-10-CM | POA: Diagnosis not present

## 2021-12-26 DIAGNOSIS — M6281 Muscle weakness (generalized): Secondary | ICD-10-CM | POA: Diagnosis not present

## 2021-12-26 DIAGNOSIS — E291 Testicular hypofunction: Secondary | ICD-10-CM | POA: Diagnosis not present

## 2021-12-26 DIAGNOSIS — M21371 Foot drop, right foot: Secondary | ICD-10-CM | POA: Diagnosis not present

## 2021-12-26 DIAGNOSIS — M5459 Other low back pain: Secondary | ICD-10-CM | POA: Diagnosis not present

## 2021-12-26 DIAGNOSIS — R262 Difficulty in walking, not elsewhere classified: Secondary | ICD-10-CM | POA: Diagnosis not present

## 2021-12-29 DIAGNOSIS — Z95810 Presence of automatic (implantable) cardiac defibrillator: Secondary | ICD-10-CM | POA: Diagnosis not present

## 2021-12-29 DIAGNOSIS — M545 Low back pain, unspecified: Secondary | ICD-10-CM | POA: Diagnosis not present

## 2021-12-29 DIAGNOSIS — F17200 Nicotine dependence, unspecified, uncomplicated: Secondary | ICD-10-CM | POA: Diagnosis not present

## 2021-12-29 DIAGNOSIS — Z955 Presence of coronary angioplasty implant and graft: Secondary | ICD-10-CM | POA: Diagnosis not present

## 2021-12-29 DIAGNOSIS — M21371 Foot drop, right foot: Secondary | ICD-10-CM | POA: Diagnosis not present

## 2021-12-29 DIAGNOSIS — I429 Cardiomyopathy, unspecified: Secondary | ICD-10-CM | POA: Diagnosis not present

## 2021-12-29 DIAGNOSIS — F1910 Other psychoactive substance abuse, uncomplicated: Secondary | ICD-10-CM | POA: Diagnosis not present

## 2021-12-29 DIAGNOSIS — M5459 Other low back pain: Secondary | ICD-10-CM | POA: Diagnosis not present

## 2021-12-29 DIAGNOSIS — I2510 Atherosclerotic heart disease of native coronary artery without angina pectoris: Secondary | ICD-10-CM | POA: Diagnosis not present

## 2021-12-29 DIAGNOSIS — R262 Difficulty in walking, not elsewhere classified: Secondary | ICD-10-CM | POA: Diagnosis not present

## 2021-12-29 DIAGNOSIS — M6281 Muscle weakness (generalized): Secondary | ICD-10-CM | POA: Diagnosis not present

## 2021-12-31 DIAGNOSIS — M5459 Other low back pain: Secondary | ICD-10-CM | POA: Diagnosis not present

## 2021-12-31 DIAGNOSIS — M6281 Muscle weakness (generalized): Secondary | ICD-10-CM | POA: Diagnosis not present

## 2021-12-31 DIAGNOSIS — R262 Difficulty in walking, not elsewhere classified: Secondary | ICD-10-CM | POA: Diagnosis not present

## 2021-12-31 DIAGNOSIS — M21371 Foot drop, right foot: Secondary | ICD-10-CM | POA: Diagnosis not present

## 2022-01-02 DIAGNOSIS — M5459 Other low back pain: Secondary | ICD-10-CM | POA: Diagnosis not present

## 2022-01-02 DIAGNOSIS — M6281 Muscle weakness (generalized): Secondary | ICD-10-CM | POA: Diagnosis not present

## 2022-01-02 DIAGNOSIS — R262 Difficulty in walking, not elsewhere classified: Secondary | ICD-10-CM | POA: Diagnosis not present

## 2022-01-02 DIAGNOSIS — M21371 Foot drop, right foot: Secondary | ICD-10-CM | POA: Diagnosis not present

## 2022-01-02 DIAGNOSIS — Z0000 Encounter for general adult medical examination without abnormal findings: Secondary | ICD-10-CM | POA: Diagnosis not present

## 2022-01-05 DIAGNOSIS — M6281 Muscle weakness (generalized): Secondary | ICD-10-CM | POA: Diagnosis not present

## 2022-01-05 DIAGNOSIS — M21371 Foot drop, right foot: Secondary | ICD-10-CM | POA: Diagnosis not present

## 2022-01-05 DIAGNOSIS — R262 Difficulty in walking, not elsewhere classified: Secondary | ICD-10-CM | POA: Diagnosis not present

## 2022-01-05 DIAGNOSIS — M5459 Other low back pain: Secondary | ICD-10-CM | POA: Diagnosis not present

## 2022-01-07 DIAGNOSIS — M5459 Other low back pain: Secondary | ICD-10-CM | POA: Diagnosis not present

## 2022-01-07 DIAGNOSIS — R262 Difficulty in walking, not elsewhere classified: Secondary | ICD-10-CM | POA: Diagnosis not present

## 2022-01-07 DIAGNOSIS — M6281 Muscle weakness (generalized): Secondary | ICD-10-CM | POA: Diagnosis not present

## 2022-01-07 DIAGNOSIS — M21371 Foot drop, right foot: Secondary | ICD-10-CM | POA: Diagnosis not present

## 2022-01-08 DIAGNOSIS — M5459 Other low back pain: Secondary | ICD-10-CM | POA: Diagnosis not present

## 2022-01-08 DIAGNOSIS — M21371 Foot drop, right foot: Secondary | ICD-10-CM | POA: Diagnosis not present

## 2022-01-08 DIAGNOSIS — M6281 Muscle weakness (generalized): Secondary | ICD-10-CM | POA: Diagnosis not present

## 2022-01-08 DIAGNOSIS — R262 Difficulty in walking, not elsewhere classified: Secondary | ICD-10-CM | POA: Diagnosis not present

## 2022-01-09 DIAGNOSIS — E291 Testicular hypofunction: Secondary | ICD-10-CM | POA: Diagnosis not present

## 2022-01-12 DIAGNOSIS — G309 Alzheimer's disease, unspecified: Secondary | ICD-10-CM | POA: Diagnosis not present

## 2022-01-14 DIAGNOSIS — M21371 Foot drop, right foot: Secondary | ICD-10-CM | POA: Diagnosis not present

## 2022-01-14 DIAGNOSIS — M5459 Other low back pain: Secondary | ICD-10-CM | POA: Diagnosis not present

## 2022-01-14 DIAGNOSIS — M6281 Muscle weakness (generalized): Secondary | ICD-10-CM | POA: Diagnosis not present

## 2022-01-14 DIAGNOSIS — R262 Difficulty in walking, not elsewhere classified: Secondary | ICD-10-CM | POA: Diagnosis not present

## 2022-01-16 DIAGNOSIS — M21371 Foot drop, right foot: Secondary | ICD-10-CM | POA: Diagnosis not present

## 2022-01-16 DIAGNOSIS — M5459 Other low back pain: Secondary | ICD-10-CM | POA: Diagnosis not present

## 2022-01-16 DIAGNOSIS — R262 Difficulty in walking, not elsewhere classified: Secondary | ICD-10-CM | POA: Diagnosis not present

## 2022-01-16 DIAGNOSIS — M6281 Muscle weakness (generalized): Secondary | ICD-10-CM | POA: Diagnosis not present

## 2022-01-20 DIAGNOSIS — M21371 Foot drop, right foot: Secondary | ICD-10-CM | POA: Diagnosis not present

## 2022-01-20 DIAGNOSIS — R262 Difficulty in walking, not elsewhere classified: Secondary | ICD-10-CM | POA: Diagnosis not present

## 2022-01-20 DIAGNOSIS — M5459 Other low back pain: Secondary | ICD-10-CM | POA: Diagnosis not present

## 2022-01-20 DIAGNOSIS — M6281 Muscle weakness (generalized): Secondary | ICD-10-CM | POA: Diagnosis not present

## 2022-01-21 DIAGNOSIS — R262 Difficulty in walking, not elsewhere classified: Secondary | ICD-10-CM | POA: Diagnosis not present

## 2022-01-21 DIAGNOSIS — M6281 Muscle weakness (generalized): Secondary | ICD-10-CM | POA: Diagnosis not present

## 2022-01-21 DIAGNOSIS — M5459 Other low back pain: Secondary | ICD-10-CM | POA: Diagnosis not present

## 2022-01-21 DIAGNOSIS — M21371 Foot drop, right foot: Secondary | ICD-10-CM | POA: Diagnosis not present

## 2022-01-23 DIAGNOSIS — M5459 Other low back pain: Secondary | ICD-10-CM | POA: Diagnosis not present

## 2022-01-23 DIAGNOSIS — M6281 Muscle weakness (generalized): Secondary | ICD-10-CM | POA: Diagnosis not present

## 2022-01-23 DIAGNOSIS — M21371 Foot drop, right foot: Secondary | ICD-10-CM | POA: Diagnosis not present

## 2022-01-23 DIAGNOSIS — R262 Difficulty in walking, not elsewhere classified: Secondary | ICD-10-CM | POA: Diagnosis not present

## 2022-01-26 DIAGNOSIS — Z95810 Presence of automatic (implantable) cardiac defibrillator: Secondary | ICD-10-CM | POA: Diagnosis not present

## 2022-01-26 DIAGNOSIS — M21371 Foot drop, right foot: Secondary | ICD-10-CM | POA: Diagnosis not present

## 2022-01-26 DIAGNOSIS — M5459 Other low back pain: Secondary | ICD-10-CM | POA: Diagnosis not present

## 2022-01-26 DIAGNOSIS — Z955 Presence of coronary angioplasty implant and graft: Secondary | ICD-10-CM | POA: Diagnosis not present

## 2022-01-26 DIAGNOSIS — I2510 Atherosclerotic heart disease of native coronary artery without angina pectoris: Secondary | ICD-10-CM | POA: Diagnosis not present

## 2022-01-26 DIAGNOSIS — M6281 Muscle weakness (generalized): Secondary | ICD-10-CM | POA: Diagnosis not present

## 2022-01-26 DIAGNOSIS — R262 Difficulty in walking, not elsewhere classified: Secondary | ICD-10-CM | POA: Diagnosis not present

## 2022-01-26 DIAGNOSIS — I429 Cardiomyopathy, unspecified: Secondary | ICD-10-CM | POA: Diagnosis not present

## 2022-01-27 DIAGNOSIS — G9009 Other idiopathic peripheral autonomic neuropathy: Secondary | ICD-10-CM | POA: Diagnosis not present

## 2022-01-27 DIAGNOSIS — F418 Other specified anxiety disorders: Secondary | ICD-10-CM | POA: Diagnosis not present

## 2022-01-28 DIAGNOSIS — M6281 Muscle weakness (generalized): Secondary | ICD-10-CM | POA: Diagnosis not present

## 2022-01-28 DIAGNOSIS — M21371 Foot drop, right foot: Secondary | ICD-10-CM | POA: Diagnosis not present

## 2022-01-28 DIAGNOSIS — R262 Difficulty in walking, not elsewhere classified: Secondary | ICD-10-CM | POA: Diagnosis not present

## 2022-01-28 DIAGNOSIS — M5459 Other low back pain: Secondary | ICD-10-CM | POA: Diagnosis not present

## 2022-01-30 DIAGNOSIS — M6281 Muscle weakness (generalized): Secondary | ICD-10-CM | POA: Diagnosis not present

## 2022-01-30 DIAGNOSIS — M21371 Foot drop, right foot: Secondary | ICD-10-CM | POA: Diagnosis not present

## 2022-01-30 DIAGNOSIS — M5459 Other low back pain: Secondary | ICD-10-CM | POA: Diagnosis not present

## 2022-01-30 DIAGNOSIS — R262 Difficulty in walking, not elsewhere classified: Secondary | ICD-10-CM | POA: Diagnosis not present

## 2022-02-02 DIAGNOSIS — M5459 Other low back pain: Secondary | ICD-10-CM | POA: Diagnosis not present

## 2022-02-02 DIAGNOSIS — R262 Difficulty in walking, not elsewhere classified: Secondary | ICD-10-CM | POA: Diagnosis not present

## 2022-02-02 DIAGNOSIS — M6281 Muscle weakness (generalized): Secondary | ICD-10-CM | POA: Diagnosis not present

## 2022-02-02 DIAGNOSIS — M21371 Foot drop, right foot: Secondary | ICD-10-CM | POA: Diagnosis not present

## 2022-02-09 DIAGNOSIS — M6281 Muscle weakness (generalized): Secondary | ICD-10-CM | POA: Diagnosis not present

## 2022-02-09 DIAGNOSIS — R262 Difficulty in walking, not elsewhere classified: Secondary | ICD-10-CM | POA: Diagnosis not present

## 2022-02-09 DIAGNOSIS — M5459 Other low back pain: Secondary | ICD-10-CM | POA: Diagnosis not present

## 2022-02-09 DIAGNOSIS — M21371 Foot drop, right foot: Secondary | ICD-10-CM | POA: Diagnosis not present

## 2022-02-11 DIAGNOSIS — M21371 Foot drop, right foot: Secondary | ICD-10-CM | POA: Diagnosis not present

## 2022-02-11 DIAGNOSIS — R262 Difficulty in walking, not elsewhere classified: Secondary | ICD-10-CM | POA: Diagnosis not present

## 2022-02-11 DIAGNOSIS — M6281 Muscle weakness (generalized): Secondary | ICD-10-CM | POA: Diagnosis not present

## 2022-02-11 DIAGNOSIS — M5459 Other low back pain: Secondary | ICD-10-CM | POA: Diagnosis not present

## 2022-02-12 ENCOUNTER — Ambulatory Visit (INDEPENDENT_AMBULATORY_CARE_PROVIDER_SITE_OTHER): Payer: BC Managed Care – PPO

## 2022-02-12 DIAGNOSIS — I255 Ischemic cardiomyopathy: Secondary | ICD-10-CM | POA: Diagnosis not present

## 2022-02-12 LAB — CUP PACEART REMOTE DEVICE CHECK
Battery Remaining Longevity: 101 mo
Battery Remaining Percentage: 93 %
Battery Voltage: 3.02 V
Brady Statistic AP VP Percent: 1 %
Brady Statistic AP VS Percent: 1 %
Brady Statistic AS VP Percent: 1 %
Brady Statistic AS VS Percent: 98 %
Brady Statistic RA Percent Paced: 1 %
Brady Statistic RV Percent Paced: 1 %
Date Time Interrogation Session: 20230802204558
HighPow Impedance: 72 Ohm
Implantable Lead Implant Date: 20120201
Implantable Lead Implant Date: 20120201
Implantable Lead Location: 753859
Implantable Lead Location: 753860
Implantable Pulse Generator Implant Date: 20230131
Lead Channel Impedance Value: 400 Ohm
Lead Channel Impedance Value: 430 Ohm
Lead Channel Pacing Threshold Amplitude: 0.75 V
Lead Channel Pacing Threshold Amplitude: 1.75 V
Lead Channel Pacing Threshold Pulse Width: 0.5 ms
Lead Channel Pacing Threshold Pulse Width: 0.7 ms
Lead Channel Sensing Intrinsic Amplitude: 12 mV
Lead Channel Sensing Intrinsic Amplitude: 3.6 mV
Lead Channel Setting Pacing Amplitude: 2 V
Lead Channel Setting Pacing Amplitude: 2.5 V
Lead Channel Setting Pacing Pulse Width: 0.7 ms
Lead Channel Setting Sensing Sensitivity: 0.5 mV
Pulse Gen Serial Number: 111050201

## 2022-03-05 NOTE — Progress Notes (Signed)
Remote ICD transmission.   

## 2022-04-13 ENCOUNTER — Telehealth (HOSPITAL_COMMUNITY): Payer: Self-pay | Admitting: Licensed Clinical Social Worker

## 2022-04-13 NOTE — Telephone Encounter (Signed)
The therapist receives a call from [place] with [employer] located in [place].  She says that the patient is being discharged and wants to schedule a CCA to attend SA IOP on 04/22/22 at 1 p.m.   She says that he has [employer]. The therapist provides the fax number for Malachy Mood to fax clinical information.  Adam Phenix, [place], LCSW, [facility], [place] 04/13/2022

## 2022-04-20 NOTE — Progress Notes (Deleted)
Electrophysiology Office Note Date: 04/20/2022  ID:  [name], DOB 20-Jan-1961, MRN [id number]  PCP: [name], NP Primary Cardiologist: None Electrophysiologist: Virl Axe, MD   CC: Routine ICD follow-up  [name] is a 61 y.o. male seen today for Virl Axe, MD for routine electrophysiology followup. Since last being seen in our clinic the patient reports doing ***.  he denies chest pain, palpitations, dyspnea, PND, orthopnea, nausea, vomiting, dizziness, syncope, edema, weight gain, or early satiety.     {He/she (caps):30048} has not had ICD shocks.   Device History: StMudlogger ICD implanted 2012, gen change 2023 for CHF  Past Medical History:  Diagnosis Date   Anxiety    Automatic implantable cardiac defibrillator St Judes    Analyze ST   Benign prostatic hypertrophy    Benzodiazepine dependence ([place])    chronic   CAD (coronary artery disease)    Last cath 2/12. 3-v CAD. Failed PCI of distal RCAc  CATH [facility] 4/13 with DES to  LAD X2   CHF (congestive heart failure) (HCC)    Chronic back pain    lumbar stenosis   Chronic systolic heart failure (HCC)    EF 20-25%. s/p ST. Jude ICD   Depression    DJD (degenerative joint disease)    History of testicular cancer    Ischemic cardiomyopathy    Myocardial infarction [facility])    Narcotic dependence ([place])    chronic   OSA on CPAP    Polycythemia, secondary    S/p hematology consultation for polycythema on 01/06/18.  S/p bone marrow biopsy on 12/31/17 that was negative for myeloproliferative disorder.  Feels secondary to sleep apnea, active smoking, testosterone supplementation, nocturnal hypoxia   TIA (transient ischemic attack)    Vitamin B12 deficiency 07/22/2016   Past Surgical History:  Procedure Laterality Date   ASD REPAIR, SINUS VENOSUS     CARDIAC DEFIBRILLATOR PLACEMENT  08/2010   HERNIA REPAIR     ICD GENERATOR CHANGEOUT N/A 08/11/2021   Procedure: ICD GENERATOR CHANGEOUT;  Surgeon:  Deboraha Sprang, MD;  Location: [facility];  Service: Cardiovascular;  Laterality: N/A;   LEFT HEART CATHETERIZATION WITH CORONARY ANGIOGRAM N/A 06/14/2014   Procedure: LEFT HEART CATHETERIZATION WITH CORONARY ANGIOGRAM;  Surgeon: Jolaine Artist, MD;  Location: [facility] CATH LAB;  Service: Cardiovascular;  Laterality: N/A;   TESTICLE SURGERY     testicular cancer surgery left testicle removed    Current Outpatient Medications  Medication Sig Dispense Refill   albuterol (VENTOLIN HFA) 108 (90 Base) MCG/ACT inhaler Inhale 2 puffs into the lungs every 6 (six) hours as needed for wheezing or shortness of breath. 18 g 5   ALPRAZolam (XANAX) 1 MG tablet Take 1 mg by mouth 4 (four) times daily.      aspirin 325 MG tablet Take 325 mg by mouth daily.     carvedilol (COREG) 25 MG tablet Take 25 mg by mouth 2 (two) times daily with a meal.     clopidogrel (PLAVIX) 75 MG tablet TAKE ONE TABLET BY MOUTH DAILY 90 tablet 0   cyanocobalamin (,VITAMIN B-12,) 1000 MCG/ML injection Inject 1 mL (1,000 mcg total) into the muscle every 30 (thirty) days. 1 mL 11   dapagliflozin propanediol (FARXIGA) 10 MG TABS tablet Take 1 tablet (10 mg total) by mouth daily before breakfast. 30 tablet 6   Daridorexant HCl (QUVIVIQ) 25 MG TABS Take 1 tablet by mouth at bedtime. 30 tablet 0  diazepam (VALIUM) 10 MG tablet Take 20 mg by mouth every 12 (twelve) hours as needed for anxiety.   2   ezetimibe (ZETIA) 10 MG tablet TAKE 1 TABLET EACH DAY. 90 tablet 0   furosemide (LASIX) 20 MG tablet Take 1 tablet (20 mg total) by mouth daily. (Patient taking differently: Take 20 mg by mouth daily as needed for edema.) 30 tablet 3   ibuprofen (ADVIL,MOTRIN) 200 MG tablet Take 800 mg by mouth every 6 (six) hours as needed for moderate pain.     lactulose (CHRONULAC) 10 GM/15ML solution Take 15 mLs (10 g total) by mouth 2 (two) times daily as needed for mild constipation. 1892 mL 0   lisinopril (PRINIVIL,ZESTRIL) 5 MG tablet Take 5 mg by  mouth daily.     methadone (METHADOSE) 40 MG disintegrating tablet Take 200 mg by mouth daily. [facility]     methocarbamol (ROBAXIN) 750 MG tablet TAKE 1 OR 2 TABLETS THREE TIMES A DAY AS NEEDED. 180 tablet 5   naproxen (NAPROSYN) 500 MG tablet Take 1 tablet (500 mg total) by mouth 2 (two) times daily as needed for moderate pain. 60 tablet 2   nitroGLYCERIN (NITROLINGUAL) 0.4 MG/SPRAY spray Place 2 sprays under the tongue every 5 (five) minutes x 3 doses as needed for chest pain.      ondansetron (ZOFRAN-ODT) 8 MG disintegrating tablet Take 1 tablet (8 mg total) by mouth every 8 (eight) hours as needed for nausea or vomiting. 90 tablet 1   pantoprazole (PROTONIX) 40 MG tablet Take 1 tablet (40 mg total) by mouth daily. 90 tablet 1   polyethylene glycol powder (GLYCOLAX/MIRALAX) 17 GM/SCOOP powder Take 17 g by mouth 2 (two) times daily as needed for mild constipation.     rosuvastatin (CRESTOR) 40 MG tablet Take 1 tablet (40 mg total) by mouth every evening. 30 tablet 2   sildenafil (VIAGRA) 100 MG tablet Take 0.5 tablets (50 mg total) by mouth daily as needed for erectile dysfunction. (Patient taking differently: Take 100 mg by mouth daily as needed for erectile dysfunction.) 30 tablet 0   spironolactone (ALDACTONE) 25 MG tablet TAKE (1/2) TABLET DAILY. (Patient taking differently: Take 25 mg by mouth daily.) 45 tablet 1   tamsulosin (FLOMAX) 0.4 MG CAPS capsule Take 1 capsule (0.4 mg total) by mouth daily after breakfast. 90 capsule 0   Testosterone Cypionate 200 MG/ML KIT Inject 200 mg into the muscle every 14 (fourteen) days.     torsemide (DEMADEX) 20 MG tablet TAKE ONE TABLET DAILY AS NEEDED FOR SWELLING IN LEGS. (Patient taking differently: Take 20 mg by mouth 2 (two) times daily.) 90 tablet 2   traZODone (DESYREL) 100 MG tablet Take 1 tablet (100 mg total) by mouth at bedtime. 90 tablet 0   triamcinolone (NASACORT AQ) 55 MCG/ACT AERO nasal inhaler Place 1 spray into the nose  daily. 1 each 12   zolpidem (AMBIEN) 10 MG tablet Take 10 mg by mouth at bedtime as needed for sleep.     No current facility-administered medications for this visit.    Allergies:   Darvocet [propoxyphene n-acetaminophen]   Social History: Social History   Socioeconomic History   Marital status: Divorced    Spouse name: Not on file   Number of children: 4   Years of education: Not on file   Highest education level: Not on file  Occupational History   Not on file  Tobacco Use   Smoking status: Every Day  Packs/day: 0.50    Years: 29.00    Total pack years: 14.50    Types: Cigarettes, E-cigarettes    Last attempt to quit: 07/14/2015    Years since quitting: 6.7   Smokeless tobacco: Never   Tobacco comments:    no cigarette in 4 days but yes to e-cig  Vaping Use   Vaping Use: Every day   Start date: 07/14/2015  Substance and Sexual Activity   Alcohol use: No    Alcohol/week: 0.0 standard drinks of alcohol   Drug use: Yes    Types: Marijuana    Comment: Urine showed THC   Sexual activity: Yes    Partners: Male  Other Topics Concern   Not on file  Social History Narrative   Marital status: married x 12 years; wife is severe alcoholic.      Children: 3 married daughters (23, 57, 47); 1 son (35yo); 3 grandchildren born in 2017      Lives: with wife, son      Left-handed   Caffeine: 3 drinks per day   Social Determinants of Health   Financial Resource Strain: Not on file  Food Insecurity: Not on file  Transportation Needs: Not on file  Physical Activity: Not on file  Stress: Not on file  Social Connections: Not on file  Intimate Partner Violence: Not on file    Family History: Family History  Problem Relation Age of Onset   Heart disease Father 79       Died of MI   Cancer Mother     Review of Systems: All other systems reviewed and are otherwise negative except as noted above.   Physical Exam: There were no vitals filed for this visit.   GEN- The  patient is well appearing, alert and oriented x 3 today.   HEENT: normocephalic, atraumatic; sclera clear, conjunctiva pink; hearing intact; oropharynx clear; neck supple, no JVP Lymph- no cervical lymphadenopathy Lungs- Clear to ausculation bilaterally, normal work of breathing.  No wheezes, rales, rhonchi Heart- {Blank single:19197::"Regular","Irregularly irregular"}  rate and rhythm, no murmurs, rubs or gallops, PMI not laterally displaced GI- soft, non-tender, non-distended, bowel sounds present, no hepatosplenomegaly Extremities- no clubbing or cyanosis. {EDEMA EGBTD:17616} peripheral edema; DP/PT/radial pulses 2+ bilaterally MS- no significant deformity or atrophy Skin- warm and dry, no rash or lesion; ICD pocket well healed Psych- euthymic mood, full affect Neuro- strength and sensation are intact  ICD interrogation- reviewed in detail today,  See PACEART report  EKG:  EKG {ACTION; IS/IS WVP:[id number]} ordered today. Personal review of EKG ordered {Blank single:19197::"today","***"} shows ***  Recent Labs: 09/17/2021: ALT 33; BUN 19; Creatinine, Ser 1.13; Hemoglobin 14.7; Platelets 159; Potassium 3.3; Sodium 131   Wt Readings from Last 3 Encounters:  09/17/21 185 lb (83.9 kg)  09/10/21 184 lb (83.5 kg)  09/05/21 185 lb 11.2 oz (84.2 kg)     Other studies Reviewed: Additional studies/ records that were reviewed today include: Previous EP office notes.   Assessment and Plan:  1.  Chronic systolic dysfunction s/p St. Jude dual chamber ICD  euvolemic today Stable on an appropriate medical regimen Normal ICD function See Pace Art report No changes today  2. QT *** on EKG today Prolonged during prior admission, but felt to be due to methadone.  3. ICM  Denies s/s ischemia   Current medicines are reviewed at length with the patient today.   =  Labs/ tests ordered today include: *** No orders of the defined types were placed  in this encounter.    Disposition:    Follow up with {EPMDS:28135} {Blank single:19197::"in 2 weeks","in 4 weeks","in 3 months","in 6 months","in 12 months","as usual post gen change"}    Signed, Shirley Friar, PA-C  04/20/2022 1:51 PM  [address] [place] [address] [phone] (office) [phone] (fax)

## 2022-04-21 DIAGNOSIS — Z125 Encounter for screening for malignant neoplasm of prostate: Secondary | ICD-10-CM | POA: Diagnosis not present

## 2022-04-21 DIAGNOSIS — N5201 Erectile dysfunction due to arterial insufficiency: Secondary | ICD-10-CM | POA: Diagnosis not present

## 2022-04-21 DIAGNOSIS — E291 Testicular hypofunction: Secondary | ICD-10-CM | POA: Diagnosis not present

## 2022-04-21 DIAGNOSIS — N401 Enlarged prostate with lower urinary tract symptoms: Secondary | ICD-10-CM | POA: Diagnosis not present

## 2022-04-21 DIAGNOSIS — R3914 Feeling of incomplete bladder emptying: Secondary | ICD-10-CM | POA: Diagnosis not present

## 2022-04-22 ENCOUNTER — Ambulatory Visit (HOSPITAL_COMMUNITY): Payer: Self-pay | Admitting: Licensed Clinical Social Worker

## 2022-04-23 ENCOUNTER — Encounter: Payer: BC Managed Care – PPO | Admitting: Student

## 2022-04-23 DIAGNOSIS — I5022 Chronic systolic (congestive) heart failure: Secondary | ICD-10-CM

## 2022-04-23 DIAGNOSIS — I255 Ischemic cardiomyopathy: Secondary | ICD-10-CM

## 2022-04-28 ENCOUNTER — Encounter (HOSPITAL_COMMUNITY): Payer: Self-pay | Admitting: Licensed Clinical Social Worker

## 2022-04-28 ENCOUNTER — Ambulatory Visit (HOSPITAL_COMMUNITY): Payer: Self-pay | Admitting: Licensed Clinical Social Worker

## 2022-04-29 NOTE — Progress Notes (Deleted)
Electrophysiology Office Note Date: 04/29/2022  ID:  [name], DOB [birth year], MRN [id number]  PCP: [name], NP Primary Cardiologist: None Electrophysiologist: Trevor Axe, MD   CC: Routine ICD follow-up  [name] is a 61 y.o. male seen today for Trevor Axe, MD for routine electrophysiology followup. Since last being seen in our clinic the patient reports doing ***.  he denies chest pain, palpitations, dyspnea, PND, orthopnea, nausea, vomiting, dizziness, syncope, edema, weight gain, or early satiety.     {He/she (caps):30048} has not had ICD shocks.   Device History: St. Jude Dual Chamber ICD implanted 2012 for CHF, s/p gen change 07/2021  Past Medical History:  Diagnosis Date   Anxiety    Automatic implantable cardiac defibrillator St Judes    Analyze ST   Benign prostatic hypertrophy    Benzodiazepine dependence (HCC)    chronic   CAD (coronary artery disease)    Last cath 2/12. 3-v CAD. Failed PCI of distal RCAc  CATH [facility] 4/13 with DES to  LAD X2   CHF (congestive heart failure) (HCC)    Chronic back pain    lumbar stenosis   Chronic systolic heart failure (HCC)    EF 20-25%. s/p ST. Jude ICD   Depression    DJD (degenerative joint disease)    History of testicular cancer    Ischemic cardiomyopathy    Myocardial infarction [facility])    Narcotic dependence ([place])    chronic   OSA on CPAP    Polycythemia, secondary    S/p hematology consultation for polycythema on 01/06/18.  S/p bone marrow biopsy on 12/31/17 that was negative for myeloproliferative disorder.  Feels secondary to sleep apnea, active smoking, testosterone supplementation, nocturnal hypoxia   TIA (transient ischemic attack)    Vitamin B12 deficiency 07/22/2016   Past Surgical History:  Procedure Laterality Date   ASD REPAIR, SINUS VENOSUS     CARDIAC DEFIBRILLATOR PLACEMENT  08/2010   HERNIA REPAIR     ICD GENERATOR CHANGEOUT N/A 08/11/2021   Procedure: ICD GENERATOR CHANGEOUT;   Surgeon: Trevor Sprang, MD;  Location: [facility];  Service: Cardiovascular;  Laterality: N/A;   LEFT HEART CATHETERIZATION WITH CORONARY ANGIOGRAM N/A 06/14/2014   Procedure: LEFT HEART CATHETERIZATION WITH CORONARY ANGIOGRAM;  Surgeon: Trevor Artist, MD;  Location: [facility] CATH LAB;  Service: Cardiovascular;  Laterality: N/A;   TESTICLE SURGERY     testicular cancer surgery left testicle removed    Current Outpatient Medications  Medication Sig Dispense Refill   albuterol (VENTOLIN HFA) 108 (90 Base) MCG/ACT inhaler Inhale 2 puffs into the lungs every 6 (six) hours as needed for wheezing or shortness of breath. 18 g 5   ALPRAZolam (XANAX) 1 MG tablet Take 1 mg by mouth 4 (four) times daily.      aspirin 325 MG tablet Take 325 mg by mouth daily.     carvedilol (COREG) 25 MG tablet Take 25 mg by mouth 2 (two) times daily with a meal.     clopidogrel (PLAVIX) 75 MG tablet TAKE ONE TABLET BY MOUTH DAILY 90 tablet 0   cyanocobalamin (,VITAMIN B-12,) 1000 MCG/ML injection Inject 1 mL (1,000 mcg total) into the muscle every 30 (thirty) days. 1 mL 11   dapagliflozin propanediol (FARXIGA) 10 MG TABS tablet Take 1 tablet (10 mg total) by mouth daily before breakfast. 30 tablet 6   Daridorexant HCl (QUVIVIQ) 25 MG TABS Take 1 tablet by mouth at bedtime. 30 tablet 0  diazepam (VALIUM) 10 MG tablet Take 20 mg by mouth every 12 (twelve) hours as needed for anxiety.   2   ezetimibe (ZETIA) 10 MG tablet TAKE 1 TABLET EACH DAY. 90 tablet 0   furosemide (LASIX) 20 MG tablet Take 1 tablet (20 mg total) by mouth daily. (Patient taking differently: Take 20 mg by mouth daily as needed for edema.) 30 tablet 3   ibuprofen (ADVIL,MOTRIN) 200 MG tablet Take 800 mg by mouth every 6 (six) hours as needed for moderate pain.     lactulose (CHRONULAC) 10 GM/15ML solution Take 15 mLs (10 g total) by mouth 2 (two) times daily as needed for mild constipation. 1892 mL 0   lisinopril (PRINIVIL,ZESTRIL) 5 MG tablet  Take 5 mg by mouth daily.     methadone (METHADOSE) 40 MG disintegrating tablet Take 200 mg by mouth daily. [facility]     methocarbamol (ROBAXIN) 750 MG tablet TAKE 1 OR 2 TABLETS THREE TIMES A DAY AS NEEDED. 180 tablet 5   naproxen (NAPROSYN) 500 MG tablet Take 1 tablet (500 mg total) by mouth 2 (two) times daily as needed for moderate pain. 60 tablet 2   nitroGLYCERIN (NITROLINGUAL) 0.4 MG/SPRAY spray Place 2 sprays under the tongue every 5 (five) minutes x 3 doses as needed for chest pain.      ondansetron (ZOFRAN-ODT) 8 MG disintegrating tablet Take 1 tablet (8 mg total) by mouth every 8 (eight) hours as needed for nausea or vomiting. 90 tablet 1   pantoprazole (PROTONIX) 40 MG tablet Take 1 tablet (40 mg total) by mouth daily. 90 tablet 1   polyethylene glycol powder (GLYCOLAX/MIRALAX) 17 GM/SCOOP powder Take 17 g by mouth 2 (two) times daily as needed for mild constipation.     rosuvastatin (CRESTOR) 40 MG tablet Take 1 tablet (40 mg total) by mouth every evening. 30 tablet 2   sildenafil (VIAGRA) 100 MG tablet Take 0.5 tablets (50 mg total) by mouth daily as needed for erectile dysfunction. (Patient taking differently: Take 100 mg by mouth daily as needed for erectile dysfunction.) 30 tablet 0   spironolactone (ALDACTONE) 25 MG tablet TAKE (1/2) TABLET DAILY. (Patient taking differently: Take 25 mg by mouth daily.) 45 tablet 1   tamsulosin (FLOMAX) 0.4 MG CAPS capsule Take 1 capsule (0.4 mg total) by mouth daily after breakfast. 90 capsule 0   Testosterone Cypionate 200 MG/ML KIT Inject 200 mg into the muscle every 14 (fourteen) days.     torsemide (DEMADEX) 20 MG tablet TAKE ONE TABLET DAILY AS NEEDED FOR SWELLING IN LEGS. (Patient taking differently: Take 20 mg by mouth 2 (two) times daily.) 90 tablet 2   traZODone (DESYREL) 100 MG tablet Take 1 tablet (100 mg total) by mouth at bedtime. 90 tablet 0   triamcinolone (NASACORT AQ) 55 MCG/ACT AERO nasal inhaler Place 1 spray  into the nose daily. 1 each 12   zolpidem (AMBIEN) 10 MG tablet Take 10 mg by mouth at bedtime as needed for sleep.     No current facility-administered medications for this visit.    Allergies:   Darvocet [propoxyphene n-acetaminophen]   Social History: Social History   Socioeconomic History   Marital status: Divorced    Spouse name: Not on file   Number of children: 4   Years of education: Not on file   Highest education level: Not on file  Occupational History   Not on file  Tobacco Use   Smoking status: Every Day  Packs/day: 0.50    Years: 29.00    Total pack years: 14.50    Types: Cigarettes, E-cigarettes    Last attempt to quit: 07/14/2015    Years since quitting: 6.7   Smokeless tobacco: Never   Tobacco comments:    no cigarette in 4 days but yes to e-cig  Vaping Use   Vaping Use: Every day   Start date: 07/14/2015  Substance and Sexual Activity   Alcohol use: No    Alcohol/week: 0.0 standard drinks of alcohol   Drug use: Yes    Types: Marijuana    Comment: Urine showed THC   Sexual activity: Yes    Partners: Male  Other Topics Concern   Not on file  Social History Narrative   Marital status: married x 12 years; wife is severe alcoholic.      Children: 3 married daughters (23, 3, 17); 1 son (33yo); 3 grandchildren born in 2017      Lives: with wife, son      Left-handed   Caffeine: 3 drinks per day   Social Determinants of Health   Financial Resource Strain: Not on file  Food Insecurity: Not on file  Transportation Needs: Not on file  Physical Activity: Not on file  Stress: Not on file  Social Connections: Not on file  Intimate Partner Violence: Not on file    Family History: Family History  Problem Relation Age of Onset   Heart disease Father 71       Died of MI   Cancer Mother     Review of Systems: All other systems reviewed and are otherwise negative except as noted above.   Physical Exam: There were no vitals filed for this visit.    GEN- The patient is well appearing, alert and oriented x 3 today.   HEENT: normocephalic, atraumatic; sclera clear, conjunctiva pink; hearing intact; oropharynx clear; neck supple, no JVP Lymph- no cervical lymphadenopathy Lungs- Clear to ausculation bilaterally, normal work of breathing.  No wheezes, rales, rhonchi Heart- Regular  rate and rhythm, no murmurs, rubs or gallops, PMI not laterally displaced GI- soft, non-tender, non-distended, bowel sounds present, no hepatosplenomegaly Extremities- no clubbing or cyanosis. No peripheral edema; DP/PT/radial pulses 2+ bilaterally MS- no significant deformity or atrophy Skin- warm and dry, no rash or lesion; ICD pocket well healed Psych- euthymic mood, full affect Neuro- strength and sensation are intact  ICD interrogation- reviewed in detail today,  See PACEART report  EKG:  EKG is ordered today. Personal review of EKG ordered {Blank single:19197::"today","***"} shows ***  Recent Labs: 09/17/2021: ALT 33; BUN 19; Creatinine, Ser 1.13; Hemoglobin 14.7; Platelets 159; Potassium 3.3; Sodium 131   Wt Readings from Last 3 Encounters:  09/17/21 185 lb (83.9 kg)  09/10/21 184 lb (83.5 kg)  09/05/21 185 lb 11.2 oz (84.2 kg)     Other studies Reviewed: Additional studies/ records that were reviewed today include: Previous EP office notes.   Assessment and Plan:  1.  Chronic systolic dysfunction s/p St. Jude dual chamber ICD  euvolemic today Stable on an appropriate medical regimen Normal ICD function See Pace Art report No changes today  2. QT / VT Previously prolonged, felt to be 2/2 methadone  3. ICM Denies s/s ischemia  Current medicines are reviewed at length with the patient today.   =  Labs/ tests ordered today include: *** No orders of the defined types were placed in this encounter.    Disposition:   Follow up with {CVELF:81017} {  Blank single:19197::"in 2 weeks","in 4 weeks","in 3 months","in 6 months","in 12  months","as usual post gen change"}    Signed, Shirley Friar, PA-C  04/29/2022 2:24 PM  [place] [address] [phone] (office) [phone] (fax)

## 2022-04-30 ENCOUNTER — Telehealth: Payer: Self-pay

## 2022-04-30 NOTE — Telephone Encounter (Signed)
Received the following transmission from [employer]: Alert remote reviewed. Normal device function.   There were VHRs detected that appear to be in the setting of atrial fib.  EGMs show AF with RVR.  Programmed DDI, ? [place].  sent to triage.  Next in-clinic is 05/05/2022.  Kathy Breach, RN, CCDS, [employer]  Patient does not have a dx of Afib, neg OAC.  There are 33 episodes of VT.  Upon review majority appear AFIB/Aflutter with RVR, longest recorded is 52 seconds (see EGMs below). AF burden is <1% but new. Also, of note, patient has signs for fluid retention on CorVue impedance (see graph below).  Tried to reach patient to assess symptoms.  Unable to reach by phone and VM was full, sent a mychart message letting him know that we are trying to reach him.  Patient has an appointment this Tuesday 05/05/22 with Oda Kilts, PA-C.  Will send to [facility] and copy Dr. Caryl Comes as well.

## 2022-05-03 ENCOUNTER — Emergency Department (HOSPITAL_COMMUNITY): Payer: BC Managed Care – PPO

## 2022-05-03 ENCOUNTER — Other Ambulatory Visit: Payer: Self-pay

## 2022-05-03 ENCOUNTER — Encounter (HOSPITAL_COMMUNITY): Payer: Self-pay | Admitting: Emergency Medicine

## 2022-05-03 ENCOUNTER — Inpatient Hospital Stay (HOSPITAL_COMMUNITY)
Admission: EM | Admit: 2022-05-03 | Discharge: 2022-05-04 | DRG: 917 | Discharge status: Against Medical Advice | Payer: BC Managed Care – PPO | Attending: Pulmonary Disease | Admitting: Pulmonary Disease

## 2022-05-03 DIAGNOSIS — F17210 Nicotine dependence, cigarettes, uncomplicated: Secondary | ICD-10-CM | POA: Diagnosis not present

## 2022-05-03 DIAGNOSIS — M549 Dorsalgia, unspecified: Secondary | ICD-10-CM | POA: Diagnosis present

## 2022-05-03 DIAGNOSIS — Z95810 Presence of automatic (implantable) cardiac defibrillator: Secondary | ICD-10-CM

## 2022-05-03 DIAGNOSIS — Z9079 Acquired absence of other genital organ(s): Secondary | ICD-10-CM

## 2022-05-03 DIAGNOSIS — Z91199 Patient's noncompliance with other medical treatment and regimen due to unspecified reason: Secondary | ICD-10-CM

## 2022-05-03 DIAGNOSIS — Z8673 Personal history of transient ischemic attack (TIA), and cerebral infarction without residual deficits: Secondary | ICD-10-CM

## 2022-05-03 DIAGNOSIS — G4733 Obstructive sleep apnea (adult) (pediatric): Secondary | ICD-10-CM | POA: Diagnosis not present

## 2022-05-03 DIAGNOSIS — F17290 Nicotine dependence, other tobacco product, uncomplicated: Secondary | ICD-10-CM | POA: Diagnosis present

## 2022-05-03 DIAGNOSIS — F419 Anxiety disorder, unspecified: Secondary | ICD-10-CM | POA: Diagnosis present

## 2022-05-03 DIAGNOSIS — I2510 Atherosclerotic heart disease of native coronary artery without angina pectoris: Secondary | ICD-10-CM | POA: Diagnosis present

## 2022-05-03 DIAGNOSIS — D751 Secondary polycythemia: Secondary | ICD-10-CM | POA: Diagnosis present

## 2022-05-03 DIAGNOSIS — T403X1A Poisoning by methadone, accidental (unintentional), initial encounter: Principal | ICD-10-CM | POA: Diagnosis present

## 2022-05-03 DIAGNOSIS — I509 Heart failure, unspecified: Secondary | ICD-10-CM | POA: Diagnosis not present

## 2022-05-03 DIAGNOSIS — E785 Hyperlipidemia, unspecified: Secondary | ICD-10-CM | POA: Diagnosis not present

## 2022-05-03 DIAGNOSIS — I5022 Chronic systolic (congestive) heart failure: Secondary | ICD-10-CM | POA: Diagnosis present

## 2022-05-03 DIAGNOSIS — Z79890 Hormone replacement therapy (postmenopausal): Secondary | ICD-10-CM

## 2022-05-03 DIAGNOSIS — E538 Deficiency of other specified B group vitamins: Secondary | ICD-10-CM | POA: Diagnosis present

## 2022-05-03 DIAGNOSIS — J9601 Acute respiratory failure with hypoxia: Secondary | ICD-10-CM | POA: Diagnosis present

## 2022-05-03 DIAGNOSIS — R7989 Other specified abnormal findings of blood chemistry: Secondary | ICD-10-CM | POA: Diagnosis not present

## 2022-05-03 DIAGNOSIS — I255 Ischemic cardiomyopathy: Secondary | ICD-10-CM | POA: Diagnosis not present

## 2022-05-03 DIAGNOSIS — I110 Hypertensive heart disease with heart failure: Secondary | ICD-10-CM | POA: Diagnosis not present

## 2022-05-03 DIAGNOSIS — I21A1 Myocardial infarction type 2: Secondary | ICD-10-CM | POA: Diagnosis present

## 2022-05-03 DIAGNOSIS — N400 Enlarged prostate without lower urinary tract symptoms: Secondary | ICD-10-CM | POA: Diagnosis present

## 2022-05-03 DIAGNOSIS — F1120 Opioid dependence, uncomplicated: Secondary | ICD-10-CM | POA: Diagnosis not present

## 2022-05-03 DIAGNOSIS — J690 Pneumonitis due to inhalation of food and vomit: Secondary | ICD-10-CM | POA: Diagnosis not present

## 2022-05-03 DIAGNOSIS — Z79899 Other long term (current) drug therapy: Secondary | ICD-10-CM

## 2022-05-03 DIAGNOSIS — Z8547 Personal history of malignant neoplasm of testis: Secondary | ICD-10-CM

## 2022-05-03 DIAGNOSIS — Z7982 Long term (current) use of aspirin: Secondary | ICD-10-CM

## 2022-05-03 DIAGNOSIS — Z7951 Long term (current) use of inhaled steroids: Secondary | ICD-10-CM

## 2022-05-03 DIAGNOSIS — G8929 Other chronic pain: Secondary | ICD-10-CM | POA: Diagnosis present

## 2022-05-03 DIAGNOSIS — I252 Old myocardial infarction: Secondary | ICD-10-CM | POA: Diagnosis not present

## 2022-05-03 DIAGNOSIS — F32A Depression, unspecified: Secondary | ICD-10-CM | POA: Diagnosis not present

## 2022-05-03 DIAGNOSIS — R000 Tachycardia, unspecified: Secondary | ICD-10-CM | POA: Diagnosis not present

## 2022-05-03 DIAGNOSIS — Z8249 Family history of ischemic heart disease and other diseases of the circulatory system: Secondary | ICD-10-CM

## 2022-05-03 DIAGNOSIS — I5042 Chronic combined systolic (congestive) and diastolic (congestive) heart failure: Secondary | ICD-10-CM

## 2022-05-03 DIAGNOSIS — N179 Acute kidney failure, unspecified: Secondary | ICD-10-CM | POA: Diagnosis not present

## 2022-05-03 DIAGNOSIS — R0902 Hypoxemia: Secondary | ICD-10-CM | POA: Diagnosis not present

## 2022-05-03 DIAGNOSIS — K30 Functional dyspepsia: Secondary | ICD-10-CM | POA: Diagnosis present

## 2022-05-03 DIAGNOSIS — I959 Hypotension, unspecified: Secondary | ICD-10-CM | POA: Diagnosis not present

## 2022-05-03 DIAGNOSIS — Z885 Allergy status to narcotic agent status: Secondary | ICD-10-CM

## 2022-05-03 DIAGNOSIS — Z888 Allergy status to other drugs, medicaments and biological substances status: Secondary | ICD-10-CM

## 2022-05-03 DIAGNOSIS — R0600 Dyspnea, unspecified: Secondary | ICD-10-CM | POA: Diagnosis not present

## 2022-05-03 DIAGNOSIS — G9340 Encephalopathy, unspecified: Secondary | ICD-10-CM | POA: Diagnosis not present

## 2022-05-03 DIAGNOSIS — E872 Acidosis, unspecified: Secondary | ICD-10-CM | POA: Diagnosis present

## 2022-05-03 DIAGNOSIS — Z955 Presence of coronary angioplasty implant and graft: Secondary | ICD-10-CM

## 2022-05-03 DIAGNOSIS — Z7902 Long term (current) use of antithrombotics/antiplatelets: Secondary | ICD-10-CM

## 2022-05-03 DIAGNOSIS — Z7151 Drug abuse counseling and surveillance of drug abuser: Secondary | ICD-10-CM

## 2022-05-03 HISTORY — DX: Other psychoactive substance abuse, uncomplicated: F19.10

## 2022-05-03 LAB — COMPREHENSIVE METABOLIC PANEL
ALT: 45 U/L — ABNORMAL HIGH (ref 0–44)
AST: 47 U/L — ABNORMAL HIGH (ref 15–41)
Albumin: 4 g/dL (ref 3.5–5.0)
Alkaline Phosphatase: 64 U/L (ref 38–126)
Anion gap: 12 (ref 5–15)
BUN: 26 mg/dL — ABNORMAL HIGH (ref 8–23)
CO2: 31 mmol/L (ref 22–32)
Calcium: 8.8 mg/dL — ABNORMAL LOW (ref 8.9–10.3)
Chloride: 95 mmol/L — ABNORMAL LOW (ref 98–111)
Creatinine, Ser: 2.82 mg/dL — ABNORMAL HIGH (ref 0.61–1.24)
GFR, Estimated: 25 mL/min — ABNORMAL LOW (ref >60)
Glucose, Bld: 138 mg/dL — ABNORMAL HIGH (ref 70–99)
Potassium: 4.2 mmol/L (ref 3.5–5.1)
Sodium: 138 mmol/L (ref 135–145)
Total Bilirubin: 0.9 mg/dL (ref 0.3–1.2)
Total Protein: 7.3 g/dL (ref 6.5–8.1)

## 2022-05-03 LAB — CBC
HCT: 39 % (ref 39.0–52.0)
Hemoglobin: 12.6 g/dL — ABNORMAL LOW (ref 13.0–17.0)
MCH: 33.5 pg (ref 26.0–34.0)
MCHC: 32.3 g/dL (ref 30.0–36.0)
MCV: 103.7 fL — ABNORMAL HIGH (ref 80.0–100.0)
Platelets: 165 10*3/uL (ref 150–400)
RBC: 3.76 MIL/uL — ABNORMAL LOW (ref 4.22–5.81)
RDW: 16.1 % — ABNORMAL HIGH (ref 11.5–15.5)
WBC: 11.8 10*3/uL — ABNORMAL HIGH (ref 4.0–10.5)
nRBC: 0.3 % — ABNORMAL HIGH (ref 0.0–0.2)

## 2022-05-03 LAB — CBC WITH DIFFERENTIAL/PLATELET
Abs Immature Granulocytes: 0.11 10*3/uL — ABNORMAL HIGH (ref 0.00–0.07)
Basophils Absolute: 0 10*3/uL (ref 0.0–0.1)
Basophils Relative: 0 %
Eosinophils Absolute: 0 10*3/uL (ref 0.0–0.5)
Eosinophils Relative: 0 %
HCT: 40.5 % (ref 39.0–52.0)
Hemoglobin: 13.1 g/dL (ref 13.0–17.0)
Immature Granulocytes: 1 %
Lymphocytes Relative: 3 %
Lymphs Abs: 0.4 10*3/uL — ABNORMAL LOW (ref 0.7–4.0)
MCH: 33.9 pg (ref 26.0–34.0)
MCHC: 32.3 g/dL (ref 30.0–36.0)
MCV: 104.9 fL — ABNORMAL HIGH (ref 80.0–100.0)
Monocytes Absolute: 1.3 10*3/uL — ABNORMAL HIGH (ref 0.1–1.0)
Monocytes Relative: 10 %
Neutro Abs: 11.4 10*3/uL — ABNORMAL HIGH (ref 1.7–7.7)
Neutrophils Relative %: 86 %
Platelets: 196 10*3/uL (ref 150–400)
RBC: 3.86 MIL/uL — ABNORMAL LOW (ref 4.22–5.81)
RDW: 16.4 % — ABNORMAL HIGH (ref 11.5–15.5)
WBC: 13.2 10*3/uL — ABNORMAL HIGH (ref 4.0–10.5)
nRBC: 0.8 % — ABNORMAL HIGH (ref 0.0–0.2)

## 2022-05-03 LAB — ACETAMINOPHEN LEVEL: Acetaminophen (Tylenol), Serum: <10 ug/mL — ABNORMAL LOW (ref 10–30)

## 2022-05-03 LAB — BLOOD GAS, VENOUS
Acid-Base Excess: 5.1 mmol/L — ABNORMAL HIGH (ref 0.0–2.0)
Bicarbonate: 32.2 mmol/L — ABNORMAL HIGH (ref 20.0–28.0)
O2 Saturation: 74.5 %
Patient temperature: 37
pCO2, Ven: 57 mmHg (ref 44–60)
pH, Ven: 7.36 (ref 7.25–7.43)
pO2, Ven: 45 mmHg (ref 32–45)

## 2022-05-03 LAB — LACTIC ACID, PLASMA
Lactic Acid, Venous: 3.9 mmol/L (ref 0.5–1.9)
Lactic Acid, Venous: 2.5 mmol/L (ref 0.5–1.9)
Lactic Acid, Venous: 2.4 mmol/L (ref 0.5–1.9)

## 2022-05-03 LAB — CK
Total CK: 959 U/L — ABNORMAL HIGH (ref 49–397)
Total CK: 985 U/L — ABNORMAL HIGH (ref 49–397)

## 2022-05-03 LAB — TROPONIN I (HIGH SENSITIVITY)
Troponin I (High Sensitivity): 1129 ng/L (ref <18)
Troponin I (High Sensitivity): 2257 ng/L (ref <18)
Troponin I (High Sensitivity): 2445 ng/L (ref <18)

## 2022-05-03 LAB — BRAIN NATRIURETIC PEPTIDE: B Natriuretic Peptide: 411.9 pg/mL — ABNORMAL HIGH (ref 0.0–100.0)

## 2022-05-03 LAB — ETHANOL: Alcohol, Ethyl (B): <10 mg/dL (ref <10)

## 2022-05-03 LAB — CREATININE, SERUM
Creatinine, Ser: 2.47 mg/dL — ABNORMAL HIGH (ref 0.61–1.24)
GFR, Estimated: 29 mL/min — ABNORMAL LOW (ref >60)

## 2022-05-03 LAB — CBG MONITORING, ED: Glucose-Capillary: 136 mg/dL — ABNORMAL HIGH (ref 70–99)

## 2022-05-03 MED ORDER — ONDANSETRON HCL 4 MG/2ML IJ SOLN
INTRAMUSCULAR | Status: AC
  Filled 2022-05-03: qty 2

## 2022-05-03 MED ORDER — NALOXONE HCL 0.4 MG/ML IJ SOLN
0.4000 mg | Freq: Once | INTRAMUSCULAR | Status: AC
  Administered 2022-05-03: 0.4 mg via INTRAVENOUS
  Filled 2022-05-03: qty 1

## 2022-05-03 MED ORDER — HEPARIN SODIUM (PORCINE) 5000 UNIT/ML IJ SOLN
5000.0000 [IU] | Freq: Three times a day (TID) | INTRAMUSCULAR | Status: DC
  Administered 2022-05-03: 5000 [IU] via SUBCUTANEOUS
  Filled 2022-05-03 (×2): qty 1

## 2022-05-03 MED ORDER — SODIUM CHLORIDE 0.9 % IV SOLN
3.0000 g | Freq: Three times a day (TID) | INTRAVENOUS | Status: DC
  Administered 2022-05-04 (×2): 3 g via INTRAVENOUS
  Filled 2022-05-03 (×2): qty 8

## 2022-05-03 MED ORDER — CLOPIDOGREL BISULFATE 75 MG PO TABS
75.0000 mg | ORAL_TABLET | Freq: Every day | ORAL | Status: DC
  Administered 2022-05-04: 75 mg via ORAL
  Filled 2022-05-03: qty 1

## 2022-05-03 MED ORDER — DOCUSATE SODIUM 100 MG PO CAPS: 100.0000 mg | ORAL_CAPSULE | Freq: Two times a day (BID) | ORAL | Status: DC | PRN

## 2022-05-03 MED ORDER — ASPIRIN 81 MG PO CHEW
81.0000 mg | CHEWABLE_TABLET | Freq: Every day | ORAL | Status: DC
  Administered 2022-05-04: 81 mg via ORAL
  Filled 2022-05-03: qty 1

## 2022-05-03 MED ORDER — ASPIRIN 81 MG PO CHEW
324.0000 mg | CHEWABLE_TABLET | Freq: Once | ORAL | Status: AC
  Administered 2022-05-03: 324 mg via ORAL
  Filled 2022-05-03: qty 4

## 2022-05-03 MED ORDER — NALOXONE HCL 4 MG/10ML IJ SOLN
0.2500 mg/h | INTRAVENOUS | Status: DC
  Administered 2022-05-03: 0.25 mg/h via INTRAVENOUS
  Filled 2022-05-03 (×3): qty 10

## 2022-05-03 MED ORDER — ONDANSETRON HCL 4 MG/2ML IJ SOLN
4.0000 mg | Freq: Once | INTRAMUSCULAR | Status: AC
  Administered 2022-05-03: 4 mg via INTRAVENOUS

## 2022-05-03 MED ORDER — POLYETHYLENE GLYCOL 3350 17 G PO PACK: 17.0000 g | PACK | Freq: Every day | ORAL | Status: DC | PRN

## 2022-05-03 MED ORDER — ONDANSETRON HCL 4 MG/2ML IJ SOLN: 4.0000 mg | Freq: Four times a day (QID) | INTRAMUSCULAR | Status: DC | PRN

## 2022-05-03 MED ORDER — ORAL CARE MOUTH RINSE: 15.0000 mL | OROMUCOSAL | Status: DC | PRN

## 2022-05-03 MED ORDER — SODIUM CHLORIDE 0.9 % IV SOLN
3.0000 g | Freq: Once | INTRAVENOUS | Status: AC
  Administered 2022-05-03: 3 g via INTRAVENOUS
  Filled 2022-05-03: qty 8

## 2022-05-03 MED ORDER — ORAL CARE MOUTH RINSE
15.0000 mL | OROMUCOSAL | Status: DC
  Administered 2022-05-04 (×2): 15 mL via OROMUCOSAL

## 2022-05-03 MED ORDER — CHLORHEXIDINE GLUCONATE CLOTH 2 % EX PADS
6.0000 | MEDICATED_PAD | Freq: Every day | CUTANEOUS | Status: DC
  Administered 2022-05-04 (×2): 6 via TOPICAL

## 2022-05-03 MED ORDER — LACTATED RINGERS IV SOLN: INTRAVENOUS | Status: AC

## 2022-05-03 MED ORDER — LACTATED RINGERS IV BOLUS
500.0000 mL | Freq: Once | INTRAVENOUS | Status: AC
  Administered 2022-05-03: 500 mL via INTRAVENOUS

## 2022-05-03 NOTE — ED Triage Notes (Addendum)
Patient presents due to an over dose of Methadone. 4 mg of narcan by his ex wife. She also gave him nitro fir chest pain. Patient took Methadone for the first time since being back home from rehab. He was in rehab for about 8 weeks and home for about 1 week. Patient was able to communicate with EMS.

## 2022-05-03 NOTE — H&P (Signed)
NAME:  [name], MRN:  [id number], DOB:  01-25-1961, LOS: 0 ADMISSION DATE:  05/03/2022, CONSULTATION DATE:  05/03/22 REFERRING MD: Dr. Billy Fischer ED, CHIEF COMPLAINT: Pain  History of Present Illness:  61 year old man with history of opiate abuse with recent 8-week stay at rehab, recently returned home approximately a week prior, found unresponsive with Narcan administered in the field with interval improvement now suddenly on Narcan drip with recurrence of depressed mental status, encephalopathy.  Patient recent return home from a week stay at rehab.  When asked he states he does not know what happened.  He states he took methadone.  He is unsure how many or the dosage.  He states he took this because he was in a lot of pain.  He denies any suicidal ideation or homicidal ideation.  The methadone is not prescribed to him.  He was reported found unresponsive.  Narcan was administered in the field by friend.  Minimal improvement.  More given.  EMS called.  Multiple doses in route.  And additional doses in the ED.  Suddenly put on Narcan drip.  He complains of cough which is new since being found unresponsive.  Labs notable for acute renal failure with creatinine almost 3 from baseline 1.1.  Leukocytosis is present.  BNP and troponin both elevated.  Cardiology was consulted and recommended no intervention at this time.  Chest x-ray on my review interpretation reveals streaky opacities in the right lower lung fields.  Pertinent  Medical History  Ischemic cardiomyopathy, OSA reportedly on BiPAP, history of opiate abuse disorder  Significant Hospital Events: Including procedures, antibiotic start and stop dates in addition to other pertinent events   05/03/2022 admitted to the hospital after being found unresponsive by ex-wife, Narcan administered in the field with interval improvement with subsequent decline in mental status, repeat Narcan doses in the ED with subsequent Narcan infusion  started  Interim History / Subjective:    Objective   Blood pressure 102/68, pulse 75, temperature 98.6 F (37 C), resp. rate (!) 9, SpO2 93 %.        Intake/Output Summary (Last 24 hours) at 05/03/2022 2138 Last data filed at 05/03/2022 1916 Gross per 24 hour  Intake 98.03 ml  Output --  Net 98.03 ml   There were no vitals filed for this visit.  Examination: General: Sitting in bed, asleep, easily arousable and maintains appropriate level of arousal during conversation HENT: EOMI, no icterus, neck supple Lungs: Rhonchorous on the left, normal work of breathing on nasal cannula Cardiovascular: Warm, trace edema on the left, none on the right, regular rate and rhythm Abdomen: Nondistended, bowel sounds present Neuro: Drowsy, easily arousable, holds attention and appropriate mentation during entirety of conversation, moves all extremities  Resolved Hospital Problem list     Assessment & Plan:  Unintentional methadone overdose: In the setting of severe pain.  History of opiate abuse with recent 8-week rehab stent, back home for approximately 1 week.  Methadone is not prescribed to him. -- Continue Narcan drip -- Reassess discontinue versus wean in the a.m.  Coronary artery disease with ischemic cardiomyopathy: Most recent TTE 07/2019 EF 40%.  ICD in place. -- Resume aspirin, Plavix -- Hold other evidence-based medicines for now, needs pharmacy reconciliation  Elevated troponin: Likely in the setting of stress demand mismatch due to overdose, hypoxemia, exacerbated by AKI. -- Trend troponin -- TTE in the a.m., repeat EKG in the a.m. -- ED physician states Case was discussed with cardiology and recommended  no intervention at this time  Acute renal failure.  Creatinine 1.13 09/2021, now 2.8.  Suspect element of hypovolemia given recent resumption of torsemide after returning from rehab.  Also possible hypovolemia in the setting of being found down, rhabdomyolysis also  possible. -- Send CK -- Gentle hydration overnight in the setting of ischemic cardiomyopathy although he appears dry to euvolemic on exam  OSA: Previously on CPAP.  Has not been using the machine, possibly without his machine for several months per his report.  Most recent pulmonary note 03/2018 and again she was using BiPAP with 5 L of oxygen at night. -- BiPAP ordered   Best Practice (right click and "Reselect all SmartList Selections" daily)   Diet/type: NPO w/ oral meds DVT prophylaxis: LMWH GI prophylaxis: N/A Lines: N/A Foley:  N/A Code Status:  full code Last date of multidisciplinary goals of care discussion [updated patient at bedside on admission, he request only information be shared with his wife if up with anyone at all]  Labs   CBC: Recent Labs  Lab 05/03/22 1921  WBC 13.2*  NEUTROABS 11.4*  HGB 13.1  HCT 40.5  MCV 104.9*  PLT 277    Basic Metabolic Panel: Recent Labs  Lab 05/03/22 1921  NA 138  K 4.2  CL 95*  CO2 31  GLUCOSE 138*  BUN 26*  CREATININE 2.82*  CALCIUM 8.8*   GFR: CrCl cannot be calculated (Unknown ideal weight.). Recent Labs  Lab 05/03/22 1921 05/03/22 1927  WBC 13.2*  --   LATICACIDVEN  --  3.9*    Liver Function Tests: Recent Labs  Lab 05/03/22 1921  AST 47*  ALT 45*  ALKPHOS 64  BILITOT 0.9  PROT 7.3  ALBUMIN 4.0   No results for input(s): "LIPASE", "AMYLASE" in the last 168 hours. No results for input(s): "AMMONIA" in the last 168 hours.  ABG    Component Value Date/Time   HCO3 32.2 (H) 05/03/2022 2047   TCO2 27 08/07/2010 1656   O2SAT 74.5 05/03/2022 2047     Coagulation Profile: No results for input(s): "INR", "PROTIME" in the last 168 hours.  Cardiac Enzymes: No results for input(s): "CKTOTAL", "CKMB", "CKMBINDEX", "TROPONINI" in the last 168 hours.  HbA1C: Hgb A1c MFr Bld  Date/Time Value Ref Range Status  09/02/2021 12:12 PM 6.6 (H) 4.6 - 6.5 % Final    Comment:    Glycemic Control Guidelines  for People with Diabetes:Non Diabetic:  <6%Goal of Therapy: <7%Additional Action Suggested:  >8%   08/23/2020 11:54 AM 5.8 (H) 4.8 - 5.6 % Final    Comment:             Prediabetes: 5.7 - 6.4          Diabetes: >6.4          Glycemic control for adults with diabetes: <7.0     CBG: Recent Labs  Lab 05/03/22 1943  GLUCAP 136*    Review of Systems:   No chest pain with exertion, no orthopnea or PND.  Comprehensive review of systems otherwise negative.  Past Medical History:  He,  has a past medical history of Anxiety, Automatic implantable cardiac defibrillator [employer], Benign prostatic hypertrophy, Benzodiazepine dependence ([place]), CAD (coronary artery disease), CHF (congestive heart failure) ([place]), Chronic back pain, Chronic systolic heart failure ([place]), Depression, DJD (degenerative joint disease), History of testicular cancer, Ischemic cardiomyopathy, Myocardial infarction ([place]), Narcotic dependence ([place]), OSA on CPAP, Polycythemia, secondary, Substance abuse ([place]), TIA (transient ischemic attack), and Vitamin  B12 deficiency (07/22/2016).   Surgical History:   Past Surgical History:  Procedure Laterality Date   ASD REPAIR, SINUS VENOSUS     CARDIAC DEFIBRILLATOR PLACEMENT  08/2010   HERNIA REPAIR     ICD GENERATOR CHANGEOUT N/A 08/11/2021   Procedure: ICD GENERATOR CHANGEOUT;  Surgeon: Deboraha Sprang, MD;  Location: [place] [facility];  Service: Cardiovascular;  Laterality: N/A;   LEFT HEART CATHETERIZATION WITH CORONARY ANGIOGRAM N/A 06/14/2014   Procedure: LEFT HEART CATHETERIZATION WITH CORONARY ANGIOGRAM;  Surgeon: Jolaine Artist, MD;  Location: [facility] CATH LAB;  Service: Cardiovascular;  Laterality: N/A;   TESTICLE SURGERY     testicular cancer surgery left testicle removed     Social History:   reports that he has been smoking cigarettes and e-cigarettes. He has a 14.50 pack-year smoking history. He has never used smokeless tobacco. He reports current drug use. Drug:  Marijuana. He reports that he does not drink alcohol.   Family History:  His family history includes Cancer in his mother; Heart disease (age of onset: 1) in his father.   Allergies Allergies  Allergen Reactions   Darvocet [Propoxyphene N-Acetaminophen] Anaphylaxis    Throat closes   Propoxyphene Anaphylaxis and Swelling    Other reaction(s): Not available   Depakote [Divalproex Sodium]     Other reaction(s): Not available   Haldol [Haloperidol]     Other reaction(s): Not available     Home Medications  Prior to Admission medications   Medication Sig Start Date End Date Taking? Authorizing Provider  albuterol (VENTOLIN HFA) 108 (90 Base) MCG/ACT inhaler Inhale 2 puffs into the lungs every 6 (six) hours as needed for wheezing or shortness of breath. 10/01/20   [name], NP  ALPRAZolam Duanne Moron) 1 MG tablet Take 1 mg by mouth 4 (four) times daily.     [provider]  aspirin 325 MG tablet Take 325 mg by mouth daily.    [provider]  carvedilol (COREG) 25 MG tablet Take 25 mg by mouth 2 (two) times daily with a meal.    [provider]  clopidogrel (PLAVIX) 75 MG tablet TAKE ONE TABLET BY MOUTH DAILY 12/23/20   [name], NP  cyanocobalamin (,VITAMIN B-12,) 1000 MCG/ML injection Inject 1 mL (1,000 mcg total) into the muscle every 30 (thirty) days. 10/03/20   [name], NP  dapagliflozin propanediol (FARXIGA) 10 MG TABS tablet Take 1 tablet (10 mg total) by mouth daily before breakfast. 10/01/20   [name], NP  Daridorexant HCl (QUVIVIQ) 25 MG TABS Take 1 tablet by mouth at bedtime. 09/10/21   [name], NP  diazepam (VALIUM) 10 MG tablet Take 20 mg by mouth every 12 (twelve) hours as needed for anxiety.  06/04/17   [provider]  ezetimibe (ZETIA) 10 MG tablet TAKE 1 TABLET EACH DAY. 10/01/20   [name], NP  furosemide (LASIX) 20 MG tablet Take 1 tablet (20 mg total) by mouth daily. Patient taking differently: Take  20 mg by mouth daily as needed for edema. 10/24/20   [name], NP  ibuprofen (ADVIL,MOTRIN) 200 MG tablet Take 800 mg by mouth every 6 (six) hours as needed for moderate pain.    [provider]  lactulose (CHRONULAC) 10 GM/15ML solution Take 15 mLs (10 g total) by mouth 2 (two) times daily as needed for mild constipation. 10/01/20   [name], NP  lisinopril (PRINIVIL,ZESTRIL) 5 MG tablet Take 5 mg by mouth daily. 07/20/18   [provider]  methadone (METHADOSE) 40 MG disintegrating tablet Take 200 mg by mouth daily. [facility]    [provider]  methocarbamol (ROBAXIN) 750 MG tablet TAKE 1 OR 2 TABLETS THREE TIMES A DAY AS NEEDED. 10/01/20   [name], NP  naproxen (NAPROSYN) 500 MG tablet Take 1 tablet (500 mg total) by mouth 2 (two) times daily as needed for moderate pain. 01/09/20   Jacelyn Pi, [name], MD  nitroGLYCERIN (NITROLINGUAL) 0.4 MG/SPRAY spray Place 2 sprays under the tongue every 5 (five) minutes x 3 doses as needed for chest pain.  03/20/15   [provider]  ondansetron (ZOFRAN-ODT) 8 MG disintegrating tablet Take 1 tablet (8 mg total) by mouth every 8 (eight) hours as needed for nausea or vomiting. 10/01/20   [name], NP  pantoprazole (PROTONIX) 40 MG tablet Take 1 tablet (40 mg total) by mouth daily. 10/01/20   [name], NP  polyethylene glycol powder (GLYCOLAX/MIRALAX) 17 GM/SCOOP powder Take 17 g by mouth 2 (two) times daily as needed for mild constipation.    [provider]  rosuvastatin (CRESTOR) 40 MG tablet Take 1 tablet (40 mg total) by mouth every evening. 10/01/20   [name], NP  sildenafil (VIAGRA) 100 MG tablet Take 0.5 tablets (50 mg total) by mouth daily as needed for erectile dysfunction. Patient taking differently: Take 100 mg by mouth daily as needed for erectile dysfunction. 09/30/20   [name], NP  spironolactone (ALDACTONE) 25 MG tablet TAKE (1/2) TABLET  DAILY. Patient taking differently: Take 25 mg by mouth daily. 10/01/20   [name], NP  tamsulosin (FLOMAX) 0.4 MG CAPS capsule Take 1 capsule (0.4 mg total) by mouth daily after breakfast. 10/03/20   [name], NP  Testosterone Cypionate 200 MG/ML KIT Inject 200 mg into the muscle every 14 (fourteen) days.    [provider]  torsemide (DEMADEX) 20 MG tablet TAKE ONE TABLET DAILY AS NEEDED FOR SWELLING IN LEGS. Patient taking differently: Take 20 mg by mouth 2 (two) times daily. 06/06/21   [name], NP  traZODone (DESYREL) 100 MG tablet Take 1 tablet (100 mg total) by mouth at bedtime. 10/01/20   [name], NP  triamcinolone (NASACORT AQ) 55 MCG/ACT AERO nasal inhaler Place 1 spray into the nose daily. 10/01/20   [name], NP  zolpidem (AMBIEN) 10 MG tablet Take 10 mg by mouth at bedtime as needed for sleep. 07/11/18   [provider]     Critical care time:     CRITICAL CARE Performed by: Lanier Clam   Total critical care time: 45 minutes  Critical care time was exclusive of separately billable procedures and treating other patients.  Critical care was necessary to treat or prevent imminent or life-threatening deterioration.  Critical care was time spent personally by me on the following activities: development of treatment plan with patient and/or surrogate as well as nursing, discussions with consultants, evaluation of patient's response to treatment, examination of patient, obtaining history from patient or surrogate, ordering and performing treatments and interventions, ordering and review of laboratory studies, ordering and review of radiographic studies, pulse oximetry and re-evaluation of patient's condition.

## 2022-05-03 NOTE — ED Provider Notes (Signed)
[facility] DEPT Provider Note   CSN: [id number] Arrival date & time: 05/03/22  1730     History {Add pertinent medical, surgical, social history, OB history to HPI:1} Chief Complaint  Patient presents with   Drug Overdose    [name] is a 61 y.o. male.  HPI      61 year old male with a history of chronic systolic heart failure with an ejection fraction 20 to 25%, status post [place] ICD, coronary artery disease, substance abuse, who per EMS had recently left rehab for polysubstance and opiate addiction 1 week ago, who presents with concern for methadone overdose.   Last night around 2 AM he took 80 to 160 mg of methadone and went to sleep.  His wife noted that he was not waking this morning, and when she attempted to wake him he was unresponsive.  She then gave him 4 mg of intranasal Narcan.  He did not wake after receiving the Narcan so she went to the pharmacy to pick up 4 more milligrams of Narcan, however when she returned back home found him to be awake.  At that time, he had complained of chest pain (she noted doing aggressive sternal rubs to wake him up) and she gave him nitroglycerin spray.  At that point she had called EMS.  Per EMS his mental status has been waxing and waning in route, with him becoming sleepy and but apneic, but waking with stimulation.  His blood pressures are also in the 40J systolic.  Denies suicidal ideation.   Past Medical History:  Diagnosis Date   Anxiety    Automatic implantable cardiac defibrillator [employer]    Analyze ST   Benign prostatic hypertrophy    Benzodiazepine dependence (HCC)    chronic   CAD (coronary artery disease)    Last cath 2/12. 3-v CAD. Failed PCI of distal RCAc  CATH [facility] 4/13 with DES to  LAD X2   CHF (congestive heart failure) (HCC)    Chronic back pain    lumbar stenosis   Chronic systolic heart failure (HCC)    EF 20-25%. s/p ST. Jude ICD   Depression    DJD (degenerative  joint disease)    History of testicular cancer    Ischemic cardiomyopathy    Myocardial infarction [facility])    Narcotic dependence ([place])    chronic   OSA on CPAP    Polycythemia, secondary    S/p hematology consultation for polycythema on 01/06/18.  S/p bone marrow biopsy on 12/31/17 that was negative for myeloproliferative disorder.  Feels secondary to sleep apnea, active smoking, testosterone supplementation, nocturnal hypoxia   Substance abuse (HCC)    TIA (transient ischemic attack)    Vitamin B12 deficiency 07/22/2016     Home Medications Prior to Admission medications   Medication Sig Start Date End Date Taking? Authorizing Provider  albuterol (VENTOLIN HFA) 108 (90 Base) MCG/ACT inhaler Inhale 2 puffs into the lungs every 6 (six) hours as needed for wheezing or shortness of breath. 10/01/20   [name], NP  ALPRAZolam Duanne Moron) 1 MG tablet Take 1 mg by mouth 4 (four) times daily.     [provider]  aspirin 325 MG tablet Take 325 mg by mouth daily.    [provider]  carvedilol (COREG) 25 MG tablet Take 25 mg by mouth 2 (two) times daily with a meal.    [provider]  clopidogrel (PLAVIX) 75 MG tablet TAKE ONE TABLET BY MOUTH  DAILY 12/23/20   [name], NP  cyanocobalamin (,VITAMIN B-12,) 1000 MCG/ML injection Inject 1 mL (1,000 mcg total) into the muscle every 30 (thirty) days. 10/03/20   [name], NP  dapagliflozin propanediol (FARXIGA) 10 MG TABS tablet Take 1 tablet (10 mg total) by mouth daily before breakfast. 10/01/20   [name], NP  Daridorexant HCl (QUVIVIQ) 25 MG TABS Take 1 tablet by mouth at bedtime. 09/10/21   [name], NP  diazepam (VALIUM) 10 MG tablet Take 20 mg by mouth every 12 (twelve) hours as needed for anxiety.  06/04/17   [provider]  ezetimibe (ZETIA) 10 MG tablet TAKE 1 TABLET EACH DAY. 10/01/20   [name], NP  furosemide (LASIX) 20 MG tablet Take 1 tablet (20 mg total) by mouth  daily. Patient taking differently: Take 20 mg by mouth daily as needed for edema. 10/24/20   [name], NP  ibuprofen (ADVIL,MOTRIN) 200 MG tablet Take 800 mg by mouth every 6 (six) hours as needed for moderate pain.    [provider]  lactulose (CHRONULAC) 10 GM/15ML solution Take 15 mLs (10 g total) by mouth 2 (two) times daily as needed for mild constipation. 10/01/20   [name], NP  lisinopril (PRINIVIL,ZESTRIL) 5 MG tablet Take 5 mg by mouth daily. 07/20/18   [provider]  methadone (METHADOSE) 40 MG disintegrating tablet Take 200 mg by mouth daily. [facility]    [provider]  methocarbamol (ROBAXIN) 750 MG tablet TAKE 1 OR 2 TABLETS THREE TIMES A DAY AS NEEDED. 10/01/20   [name], NP  naproxen (NAPROSYN) 500 MG tablet Take 1 tablet (500 mg total) by mouth 2 (two) times daily as needed for moderate pain. 01/09/20   Jacelyn Pi, [name], MD  nitroGLYCERIN (NITROLINGUAL) 0.4 MG/SPRAY spray Place 2 sprays under the tongue every 5 (five) minutes x 3 doses as needed for chest pain.  03/20/15   [provider]  ondansetron (ZOFRAN-ODT) 8 MG disintegrating tablet Take 1 tablet (8 mg total) by mouth every 8 (eight) hours as needed for nausea or vomiting. 10/01/20   [name], NP  pantoprazole (PROTONIX) 40 MG tablet Take 1 tablet (40 mg total) by mouth daily. 10/01/20   [name], NP  polyethylene glycol powder (GLYCOLAX/MIRALAX) 17 GM/SCOOP powder Take 17 g by mouth 2 (two) times daily as needed for mild constipation.    [provider]  rosuvastatin (CRESTOR) 40 MG tablet Take 1 tablet (40 mg total) by mouth every evening. 10/01/20   [name], NP  sildenafil (VIAGRA) 100 MG tablet Take 0.5 tablets (50 mg total) by mouth daily as needed for erectile dysfunction. Patient taking differently: Take 100 mg by mouth daily as needed for erectile dysfunction. 09/30/20   [name], NP  spironolactone  (ALDACTONE) 25 MG tablet TAKE (1/2) TABLET DAILY. Patient taking differently: Take 25 mg by mouth daily. 10/01/20   [name], NP  tamsulosin (FLOMAX) 0.4 MG CAPS capsule Take 1 capsule (0.4 mg total) by mouth daily after breakfast. 10/03/20   [name], NP  Testosterone Cypionate 200 MG/ML KIT Inject 200 mg into the muscle every 14 (fourteen) days.    [provider]  torsemide (DEMADEX) 20 MG tablet TAKE ONE TABLET DAILY AS NEEDED FOR SWELLING IN LEGS. Patient taking differently: Take 20 mg by mouth 2 (two) times daily. 06/06/21   [name], NP  traZODone (DESYREL) 100 MG tablet Take 1 tablet (100 mg total) by mouth at  bedtime. 10/01/20   [name], NP  triamcinolone (NASACORT AQ) 55 MCG/ACT AERO nasal inhaler Place 1 spray into the nose daily. 10/01/20   [name], NP  zolpidem (AMBIEN) 10 MG tablet Take 10 mg by mouth at bedtime as needed for sleep. 07/11/18   [provider]      Allergies    Darvocet [propoxyphene n-acetaminophen]    Review of Systems   Review of Systems  Physical Exam Updated Vital Signs BP 122/64   Pulse (!) 104   Resp 11   SpO2 93%  Physical Exam Vitals and nursing note reviewed.  Constitutional:      General: He is not in acute distress.    Appearance: He is well-developed. He is ill-appearing. He is not diaphoretic.  HENT:     Head: Normocephalic and atraumatic.     Nose: Rhinorrhea: right LL.  Eyes:     Conjunctiva/sclera: Conjunctivae normal.  Cardiovascular:     Rate and Rhythm: Normal rate and regular rhythm.     Heart sounds: Normal heart sounds. No murmur heard.    No friction rub. No gallop.  Pulmonary:     Effort: Pulmonary effort is normal. No respiratory distress.     Breath sounds: Rhonchi present. No wheezing or rales.  Abdominal:     General: There is no distension.     Palpations: Abdomen is soft.     Tenderness: There is no abdominal tenderness. There is no guarding.  Musculoskeletal:      Cervical back: Normal range of motion.  Skin:    General: Skin is warm and dry.  Neurological:     Comments: Sleepy, will awaken and answers questions with stimulation     ED Results / Procedures / Treatments   Labs (all labs ordered are listed, but only abnormal results are displayed) Labs Reviewed  CULTURE, BLOOD (ROUTINE X 2)  CULTURE, BLOOD (ROUTINE X 2)  CBC WITH DIFFERENTIAL/PLATELET  COMPREHENSIVE METABOLIC PANEL  LACTIC ACID, PLASMA  LACTIC ACID, PLASMA  BRAIN NATRIURETIC PEPTIDE  ETHANOL  ACETAMINOPHEN LEVEL  RAPID URINE DRUG SCREEN, HOSP PERFORMED  TROPONIN I (HIGH SENSITIVITY)    EKG None  Radiology No results found.  Procedures Procedures  {Document cardiac monitor, telemetry assessment procedure when appropriate:1}  Medications Ordered in ED Medications  ondansetron (ZOFRAN) 4 MG/2ML injection (has no administration in time range)  Ampicillin-Sulbactam (UNASYN) 3 g in sodium chloride 0.9 % 100 mL IVPB (has no administration in time range)  naloxone [facility]) injection 0.4 mg (0.4 mg Intravenous Given 05/03/22 1749)  ondansetron (ZOFRAN) injection 4 mg (4 mg Intravenous Given 05/03/22 1753)    ED Course/ Medical Decision Making/ A&P                            61 year old male with a history of chronic systolic heart failure with an ejection fraction 20 to 25%, status post [place] ICD, coronary artery disease, substance abuse, who per EMS had recently left rehab for polysubstance and opiate addiction 1 week ago, who presents with concern for methadone overdose.  On arrival to the emergency department, he is sedated, intermittently but apneic, requiring stimulation in order to maintain saturation, and hypotensive to the 90s.  Due to worsening of mental status compared to previous noted by EMS, hypoxia, and hypotension, he was given additional 0.4 mg of Narcan.  Following this, mental status, respiratory rate and blood pressures have all improved.  He was  given Zofran for nausea.  He continues to have hypoxia despite his improved respiratory rate, and exam concerning for possible aspiration pneumonia.  EKG obtained and personally evaluated interpreted by me shows a normal sinus rhythm without acute ST changes.   Chest x-ray completed and personally evaluated by me and radiology, in comparison to prior does not look significantly changed, however clinically do feel exam is consistent with aspiration pneumonia, and has possible right opacity on x-ray.  Labs completed and personally advised interpreted by me show hemoglobin***, white blood cell count***, troponin***, BNP***, lactic acid***  {Document critical care time when appropriate:1} {Document review of labs and clinical decision tools ie heart score, Chads2Vasc2 etc:1}  {Document your independent review of radiology images, and any outside records:1} {Document your discussion with family members, caretakers, and with consultants:1} {Document social determinants of health affecting pt's care:1} {Document your decision making why or why not admission, treatments were needed:1} Final Clinical Impression(s) / ED Diagnoses Final diagnoses:  None    Rx / DC Orders ED Discharge Orders     None

## 2022-05-03 NOTE — ED Notes (Signed)
Attempted to get urine specimen from patient. He was not able to provide one.

## 2022-05-04 ENCOUNTER — Inpatient Hospital Stay (HOSPITAL_COMMUNITY): Payer: BC Managed Care – PPO

## 2022-05-04 DIAGNOSIS — I255 Ischemic cardiomyopathy: Secondary | ICD-10-CM

## 2022-05-04 DIAGNOSIS — T403X1A Poisoning by methadone, accidental (unintentional), initial encounter: Secondary | ICD-10-CM | POA: Diagnosis not present

## 2022-05-04 DIAGNOSIS — R7989 Other specified abnormal findings of blood chemistry: Secondary | ICD-10-CM | POA: Diagnosis not present

## 2022-05-04 LAB — CBC
HCT: 36.9 % — ABNORMAL LOW (ref 39.0–52.0)
Hemoglobin: 12 g/dL — ABNORMAL LOW (ref 13.0–17.0)
MCH: 33.5 pg (ref 26.0–34.0)
MCHC: 32.5 g/dL (ref 30.0–36.0)
MCV: 103.1 fL — ABNORMAL HIGH (ref 80.0–100.0)
Platelets: 162 10*3/uL (ref 150–400)
RBC: 3.58 MIL/uL — ABNORMAL LOW (ref 4.22–5.81)
RDW: 15.9 % — ABNORMAL HIGH (ref 11.5–15.5)
WBC: 11 10*3/uL — ABNORMAL HIGH (ref 4.0–10.5)
nRBC: 0.5 % — ABNORMAL HIGH (ref 0.0–0.2)

## 2022-05-04 LAB — BASIC METABOLIC PANEL
Anion gap: 12 (ref 5–15)
BUN: 27 mg/dL — ABNORMAL HIGH (ref 8–23)
CO2: 28 mmol/L (ref 22–32)
Calcium: 8.5 mg/dL — ABNORMAL LOW (ref 8.9–10.3)
Chloride: 98 mmol/L (ref 98–111)
Creatinine, Ser: 2.06 mg/dL — ABNORMAL HIGH (ref 0.61–1.24)
GFR, Estimated: 36 mL/min — ABNORMAL LOW (ref >60)
Glucose, Bld: 118 mg/dL — ABNORMAL HIGH (ref 70–99)
Potassium: 4.1 mmol/L (ref 3.5–5.1)
Sodium: 138 mmol/L (ref 135–145)

## 2022-05-04 LAB — TROPONIN I (HIGH SENSITIVITY)
Troponin I (High Sensitivity): 2638 ng/L (ref <18)
Troponin I (High Sensitivity): 1944 ng/L (ref <18)
Troponin I (High Sensitivity): 1767 ng/L (ref <18)

## 2022-05-04 LAB — MRSA NEXT GEN BY PCR, NASAL: MRSA by PCR Next Gen: NOT DETECTED

## 2022-05-04 LAB — ECHOCARDIOGRAM COMPLETE
Area-P 1/2: 3.02 cm2
Height: 72 in
S' Lateral: 6.3 cm
Weight: 3798.97 oz

## 2022-05-04 LAB — HIV ANTIBODY (ROUTINE TESTING W REFLEX): HIV Screen 4th Generation wRfx: NONREACTIVE

## 2022-05-04 LAB — LACTIC ACID, PLASMA
Lactic Acid, Venous: 2.1 mmol/L (ref 0.5–1.9)
Lactic Acid, Venous: 1.3 mmol/L (ref 0.5–1.9)
Lactic Acid, Venous: 1.4 mmol/L (ref 0.5–1.9)

## 2022-05-04 LAB — PHOSPHORUS: Phosphorus: 4.7 mg/dL — ABNORMAL HIGH (ref 2.5–4.6)

## 2022-05-04 LAB — MAGNESIUM: Magnesium: 2.4 mg/dL (ref 1.7–2.4)

## 2022-05-04 MED ORDER — AMOXICILLIN-POT CLAVULANATE 875-125 MG PO TABS: 1.0000 | ORAL_TABLET | Freq: Two times a day (BID) | ORAL | 0 refills | Status: DC

## 2022-05-04 MED ORDER — PERFLUTREN LIPID MICROSPHERE
1.0000 mL | INTRAVENOUS | Status: AC | PRN
  Administered 2022-05-04: 3 mL via INTRAVENOUS
  Filled 2022-05-04: qty 10

## 2022-05-04 MED ORDER — PANTOPRAZOLE SODIUM 40 MG PO TBEC
40.0000 mg | DELAYED_RELEASE_TABLET | Freq: Every day | ORAL | Status: DC
  Administered 2022-05-04: 40 mg via ORAL
  Filled 2022-05-04: qty 1

## 2022-05-04 MED ORDER — ORAL CARE MOUTH RINSE: 15.0000 mL | OROMUCOSAL | Status: DC | PRN

## 2022-05-04 MED ORDER — DAPAGLIFLOZIN PROPANEDIOL 10 MG PO TABS: ORAL_TABLET | ORAL | 6 refills | Status: DC

## 2022-05-04 MED ORDER — NALOXONE HCL 4 MG/0.1ML NA LIQD: 1.0000 | Freq: Once | NASAL | 2 refills | Status: AC | PRN

## 2022-05-04 MED ORDER — MELATONIN 3 MG PO TABS
3.0000 mg | ORAL_TABLET | Freq: Once | ORAL | Status: AC | PRN
  Administered 2022-05-04: 3 mg via ORAL
  Filled 2022-05-04: qty 1

## 2022-05-04 NOTE — TOC Progression Note (Addendum)
Transition of Care [facility]) - Progression Note    Patient Details  Name: [name] MRN: [id number] Date of Birth: 1961/06/09  Transition of Care [facility]) CM/SW [place], RN Phone Number: 05/04/2022, 5:17 PM  Clinical Narrative:   Received call at 4:53pm indicating patient needs home oxygen. Reached out to [facility] with [employer] currently unable to deliver until later, due to being after hours. Reached out to [place] with [employer], awaiting a response. Attempted Caryl Pina with [employer], awaiting a response. Due to being late in the day maybe a challenge to get home oxygen delivered.   TOC will continue to follow.   Leatrice Jewels with [employer] will have home oxygen delivered, however RN will need to update her oxygen sats note.     Expected Discharge Plan: [place] Barriers to Discharge: Continued Medical Work up  Expected Discharge Plan and Services Expected Discharge Plan: [place] In-house Referral: NA Discharge Planning Services: CM Consult Post Acute Care Choice: [place] arrangements for the past 2 months: Single Family Home                 DME Arranged: N/A DME Agency: [employer], [employer] Date DME Agency Contacted: 05/04/22 Time DME Agency Contacted: [phone] Representative spoke with at DME Agency: Andee Poles ([employer]) & Brenton Grills ([employer]) HH Arranged: NA HH Agency: NA         Social Determinants of Health (SDOH) Interventions Food Insecurity Interventions: Intervention Not Indicated Housing Interventions: Intervention Not Indicated Transportation Interventions: Intervention Not Indicated Utilities Interventions: Intervention Not Indicated  Readmission Risk Interventions     No data to display

## 2022-05-04 NOTE — TOC Transition Note (Addendum)
Transition of Care [facility]) - CM/SW Discharge Note   Patient Details  Name: [name] MRN: [id number] Date of Birth: 1960/07/24  Transition of Care [facility]) CM/SW Contact:  Ross Ludwig, LCSW Phone Number: 05/04/2022, 5:25 PM   Clinical Narrative:     TOC was informed that patient needs oxygen.  TOC RN case manager Hulan Amato contacted [employer] and left a message on voice mail for oxygen needed for patient.  This TOC CSW contacted Ashly at [facility] who said they can provide oxygen for patient and will have a portable tank delivered to the room prior to patient discharging.  Ashly to call this CSW back with estimated time of delivery.  5:40pm  CSW informed by Ashly that oxygen will be delivered within the hour.  CSW notified bedside nurse, [place] signing off.  Final next level of care: Home/Self Care Barriers to Discharge: Barriers Resolved   Patient Goals and CMS Choice Patient states their goals for this hospitalization and ongoing recovery are:: To return back home CMS Medicare.gov Compare Post Acute Care list provided to:: Patient Choice offered to / list presented to : Patient  Discharge Placement                       Discharge Plan and Services In-house Referral: NA Discharge Planning Services: CM Consult Post Acute Care Choice: Home Health          DME Arranged: Oxygen DME Agency: [employer] Date DME Agency Contacted: 05/04/22 Time DME Agency Contacted: 72 Representative spoke with at DME Agency: Ashly HH Arranged: NA [place] Agency: NA        Social Determinants of Health (SDOH) Interventions Food Insecurity Interventions: Intervention Not Indicated Housing Interventions: Intervention Not Indicated Transportation Interventions: Intervention Not Indicated Utilities Interventions: Intervention Not Indicated   Readmission Risk Interventions     No data to display

## 2022-05-04 NOTE — Progress Notes (Signed)
Pt currently awake and talking on phone.  RT discussed BIPAP QHS with pt.. Pt states that he has not used it in several months but is agreeable to trying it once he is ready for bed.  Pt currently on 3 LPM [place] and tolerating well.  BIPAP setup and ready at bedside.  Pt to notify RT/RN once ready for sleep, RN aware.

## 2022-05-04 NOTE — Consult Note (Signed)
Cardiology Consultation   Patient ID: KEY [id number] MRN: [id number]; DOB: 29-Apr-1961  Admit date: 05/03/2022 Date of Consult: 05/04/2022  PCP:  [name], NP   [facility] Providers Cardiologist:  None  Electrophysiologist:  Virl Axe, MD  Advanced Heart Failure:  Glori Bickers, MD       Patient Profile:   [name] is a 61 y.o. 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 Loanne Drilling.  History of Present Illness:   Per HPI: history of opiate abuse with recent 8-week stay at rehab, recently returned home approximately a week prior, found unresponsive with Narcan administered in the field with interval improvement now suddenly on Narcan drip with recurrence of depressed mental status, encephalopathy.   Patient recent return home from a week stay at rehab.  When asked he states he does not know what happened.  He states he took methadone.  He is unsure how many or the dosage.  He states he took this because he was in a lot of pain.  He denies any suicidal ideation or homicidal ideation.  The methadone is not prescribed to him.  He was reported found unresponsive.  Narcan was administered in the field by friend.  Minimal improvement.  More given.  EMS called.  Multiple doses in route.  And additional doses in the ED.  Subseqently put on Narcan drip.  ECG- no ischemic changes Troponin peaked 2,638 Crt 2.8->2.4->2.06 GFR 25->29->36  Patient of Dr. Caryl Comes and [place]. Last saw Dr. Haroldine Laws 01/2020.  Noted to have followed with Dr. Stann Mainland at [facility] in the transplant clinic.  Underwent cath at [facility] in 3/15 and had 2.5x24m Xience DES placed in LAD. Had cath 12/15 here with stable CAD. Repeat cath set up in 10/18 but he cancelled. He was talking via the telephone at that  time and was intoxicated sounding.  Plan at that time was for losartan 25 mg daily and farxiga 10 mg daily. He was on coreg and spironolactone. He noted seeing another cardiologist who stopped his medications. Unknown reason.   Past Medical History:  Diagnosis Date   Anxiety    Automatic implantable cardiac defibrillator St Judes    Analyze ST   Benign prostatic hypertrophy    Benzodiazepine dependence (HCC)    chronic   CAD (coronary artery disease)    Last cath 2/12. 3-v CAD. Failed PCI of distal RCAc  CATH [facility] 4/13 with DES to  LAD X2   CHF (congestive heart failure) (HCC)    Chronic back pain    lumbar stenosis   Chronic systolic heart failure (HCC)    EF 20-25%. s/p ST. Jude ICD   Depression    DJD (degenerative joint disease)    History of testicular cancer    Ischemic cardiomyopathy    Myocardial infarction ([facility]    N[place]otic [place]    chronic   OSA on CPAP    Polycythemia, secondary    S/p hematology consultation for polycythema on 01/06/18.  S/p bone marrow biopsy on 12/31/17 that was negative for myeloproliferative disorder.  Feels secondary to sleep apnea, active smoking, testosterone supplementation, nocturnal hypoxia   Substance [place] ([place]    TIA (transient ischemic attack)    Vitamin B12 deficiency 07/22/2016    Past Surgical  History:  Procedure Laterality Date   ASD REPAIR, SINUS VENOSUS     CARDIAC DEFIBRILLATOR PLACEMENT  08/2010   HERNIA REPAIR     ICD GENERATOR CHANGEOUT N/A 08/11/2021   Procedure: ICD GENERATOR CHANGEOUT;  Surgeon: Deboraha Sprang, MD;  Location: [place] CV LAB;  Service: Cardiovascular;  Laterality: N/A;   LEFT HEART CATHETERIZATION WITH CORONARY ANGIOGRAM N/A 06/14/2014   Procedure: LEFT HEART CATHETERIZATION WITH CORONARY ANGIOGRAM;  Surgeon: Jolaine Artist, MD;  Location: [facility] CATH LAB;  Service: Cardiovascular;  Laterality: N/A;   TESTICLE SURGERY     testicular cancer surgery left testicle removed     Home  Medications:  Prior to Admission medications   Medication Sig Start Date End Date Taking? Authorizing Provider  ALPRAZolam Duanne Moron) 1 MG tablet Take 1 mg by mouth 2 (two) times daily as needed for anxiety.   Yes [provider]  aspirin EC 81 MG tablet Take 81 mg by mouth in the morning. Swallow whole.   Yes [provider]  atorvastatin (LIPITOR) 20 MG tablet Take 20 mg by mouth daily.   Yes [provider]  Cholecalciferol (VITAMIN D3) 125 MCG (5000 UT) CAPS Take 5,000 Units by mouth daily.   Yes [provider]  cloNIDine (CATAPRES) 0.2 MG tablet Take 0.2 mg by mouth at bedtime as needed (if Trazodone is ineffective).   Yes [provider]  clopidogrel (PLAVIX) 75 MG tablet TAKE ONE TABLET BY MOUTH DAILY Patient taking differently: Take 75 mg by mouth in the morning. 12/23/20  Yes [name], NP  lisinopril (ZESTRIL) 10 MG tablet Take 10 mg by mouth in the morning.   Yes [provider]  albuterol (VENTOLIN HFA) 108 (90 Base) MCG/ACT inhaler Inhale 2 puffs into the lungs every 6 (six) hours as needed for wheezing or shortness of breath. Patient not taking: Reported on 05/04/2022 10/01/20   [name], NP  carvedilol (COREG) 25 MG tablet Take 25 mg by mouth 2 (two) times daily with a meal. Patient not taking: Reported on 05/04/2022    [provider]  cyanocobalamin (,VITAMIN B-12,) 1000 MCG/ML injection Inject 1 mL (1,000 mcg total) into the muscle every 30 (thirty) days. Patient not taking: Reported on 05/04/2022 10/03/20   [name], NP  dapagliflozin propanediol (FARXIGA) 10 MG TABS tablet Take 1 tablet (10 mg total) by mouth daily before breakfast. Patient not taking: Reported on 05/04/2022 10/01/20   [name], NP  Daridorexant HCl (QUVIVIQ) 25 MG TABS Take 1 tablet by mouth at bedtime. 09/10/21   [name], NP  diazepam (VALIUM) 10 MG tablet Take 20 mg by mouth every 12 (twelve) hours as needed for  anxiety.  06/04/17   [provider]  ezetimibe (ZETIA) 10 MG tablet TAKE 1 TABLET EACH DAY. 10/01/20   [name], NP  furosemide (LASIX) 20 MG tablet Take 1 tablet (20 mg total) by mouth daily. Patient taking differently: Take 20 mg by mouth daily as needed for edema or fluid. 10/24/20   [name], NP  gabapentin (NEURONTIN) 100 MG capsule Take 300 mg by mouth 3 (three) times daily. 03/10/22   [provider]  ibuprofen (ADVIL,MOTRIN) 200 MG tablet Take 800 mg by mouth every 6 (six) hours as needed for moderate pain.    [provider]  lactulose (CHRONULAC) 10 GM/15ML solution Take 15 mLs (10 g total) by mouth 2 (two) times daily as needed for mild constipation. 10/01/20   [name], NP  Magnesium  250 MG TABS Take 250 mg by mouth daily.    [provider]  methadone (METHADOSE) 40 MG disintegrating tablet Take 200 mg by mouth daily. [facility]    [provider]  methocarbamol (ROBAXIN) 750 MG tablet TAKE 1 OR 2 TABLETS THREE TIMES A DAY AS NEEDED. Patient not taking: Reported on 05/03/2022 10/01/20   [name], NP  metoprolol succinate (TOPROL-XL) 25 MG 24 hr tablet Take 25 mg by mouth daily.    [provider]  naproxen (NAPROSYN) 500 MG tablet Take 1 tablet (500 mg total) by mouth 2 (two) times daily as needed for moderate pain. Patient not taking: Reported on 05/03/2022 01/09/20   Jacelyn Pi, [name], MD  nitroGLYCERIN (NITROLINGUAL) 0.4 MG/SPRAY spray Place 2 sprays under the tongue every 5 (five) minutes x 3 doses as needed for chest pain.  03/20/15   [provider]  ondansetron (ZOFRAN-ODT) 8 MG disintegrating tablet Take 1 tablet (8 mg total) by mouth every 8 (eight) hours as needed for nausea or vomiting. 10/01/20   [name], NP  pantoprazole (PROTONIX) 40 MG tablet Take 1 tablet (40 mg total) by mouth daily. 10/01/20   [name], NP  polyethylene glycol powder (GLYCOLAX/MIRALAX)  17 GM/SCOOP powder Take 17 g by mouth 2 (two) times daily as needed for mild constipation.    [provider]  rosuvastatin (CRESTOR) 40 MG tablet Take 1 tablet (40 mg total) by mouth every evening. 10/01/20   [name], NP  sildenafil (VIAGRA) 100 MG tablet Take 0.5 tablets (50 mg total) by mouth daily as needed for erectile dysfunction. Patient taking differently: Take 100 mg by mouth daily as needed for erectile dysfunction. 09/30/20   [name], NP  spironolactone (ALDACTONE) 25 MG tablet TAKE (1/2) TABLET DAILY. Patient taking differently: Take 25 mg by mouth daily. 10/01/20   [name], NP  tamsulosin (FLOMAX) 0.4 MG CAPS capsule Take 1 capsule (0.4 mg total) by mouth daily after breakfast. Patient taking differently: Take 0.4 mg by mouth at bedtime. 10/03/20   [name], NP  Testosterone Cypionate 200 MG/ML KIT Inject 200 mg into the muscle every 14 (fourteen) days.    [provider]  torsemide (DEMADEX) 20 MG tablet TAKE ONE TABLET DAILY AS NEEDED FOR SWELLING IN LEGS. Patient taking differently: Take 20 mg by mouth daily. 06/06/21   [name], NP  traZODone (DESYREL) 100 MG tablet Take 1 tablet (100 mg total) by mouth at bedtime. Patient taking differently: Take 200 mg by mouth at bedtime. 10/01/20   [name], NP  triamcinolone (NASACORT AQ) 55 MCG/ACT AERO nasal inhaler Place 1 spray into the nose daily. 10/01/20   [name], NP  zolpidem (AMBIEN) 10 MG tablet Take 10 mg by mouth at bedtime as needed for sleep. 07/11/18   [provider]    Inpatient Medications: Scheduled Meds:  aspirin  81 mg Oral Daily   Chlorhexidine Gluconate Cloth  6 each Topical Daily   clopidogrel  75 mg Oral Daily   heparin  5,000 Units Subcutaneous Q8H   mouth rinse  15 mL Mouth Rinse 4 times per day   pantoprazole  40 mg Oral Q1200   Continuous Infusions:  ampicillin-sulbactam (UNASYN) IV Stopped (05/04/22 1125)   naloxone HCl (NARCAN) 4  mg in dextrose 5 % 250 mL infusion Stopped (05/04/22 1518)   PRN Meds: docusate sodium, ondansetron (ZOFRAN) IV, mouth rinse, mouth rinse, polyethylene glycol  Allergies:    Allergies  Allergen Reactions   Darvocet [Propoxyphene N-Acetaminophen] Anaphylaxis    Throat closes   Propoxyphene Anaphylaxis and Swelling   Depakote [Divalproex Sodium] Other (See Comments)    Coded    Haldol [Haloperidol] Other (See Comments)    Coded- ended up on a vent    Social History:   Social History   Socioeconomic History   Marital status: Divorced    Spouse name: Not on file   Number of children: 4   Years of education: Not on file   Highest education level: Not on file  Occupational History   Not on file  Tobacco Use   Smoking status: Every Day    Packs/day: 0.50    Years: 29.00    Total pack years: 14.50    Types: Cigarettes, E-cigarettes    Last attempt to quit: 07/14/2015    Years since quitting: 6.8   Smokeless tobacco: Never   Tobacco comments:    no cigarette in 4 days but yes to e-cig  Vaping Use   Vaping Use: Every day   Start date: 07/14/2015  Substance and Sexual Activity   Alcohol use: No    Alcohol/week: 0.0 standard drinks of alcohol   Drug use: Yes    Types: Marijuana    Comment: Urine showed THC   Sexual activity: Yes    Partners: Male  Other Topics Concern   Not on file  Social History Narrative   Marital status: married x 12 years; wife is severe alcoholic.      Children: 3 married daughters (23, 23, 35); 1 son (65yo); 3 grandchildren born in 2017      Lives: with wife, son      Left-handed   Caffeine: 3 drinks per day   Social Determinants of Health   Financial Resource Strain: Not on file  Food Insecurity: No Food Insecurity (05/04/2022)   Hunger Vital Sign    Worried About Running Out of Food in the Last Year: Never true    Ran Out of Food in the Last Year: Never true  Transportation Needs: No Transportation Needs (05/04/2022)   PRAPARE -  Hydrologist (Medical): No    Lack of Transportation (Non-Medical): No  Physical Activity: Not on file  Stress: Not on file  Social Connections: Not on file  Intimate Partner Violence: Not At Risk (05/04/2022)   Humiliation, Afraid, Rape, and Kick questionnaire    Fear of Current or Ex-Partner: No    Emotionally Abused: No    Physically Abused: No    Sexually Abused: No    Family History:    Family History  Problem Relation Age of Onset   Heart disease Father 4       Died of MI   Cancer Mother      ROS:  Please see the history of present illness.   All other ROS reviewed and negative.     Physical Exam/Data:   Vitals:   05/04/22 1315 05/04/22 1330 05/04/22 1345 05/04/22 1400  BP:    111/63  Pulse: 68 80 73 74  Resp: _0 Temp:      TempSrc:      SpO2: (!) 89% 95% 97% 93%  Weight:      Height:        Intake/Output Summary (Last 24 hours) at 05/04/2022 1528 Last data filed at 05/04/2022 1400 Gross per 24 hour  Intake 1005.31 ml  Output 1400 ml  Net -394.69 ml  05/04/2022    1:30 AM 09/17/2021   12:45 AM 09/10/2021    9:28 AM  Last 3 Weights  Weight (lbs) 237 lb 7 oz 185 lb 184 lb  Weight (kg) 107.7 kg 83.915 kg 83.462 kg     Body mass index is 32.2 kg/m.  General:  Well nourished, well developed, in no acute distress HEENT: normal Neck: no JVD Vascular: No carotid bruits; Distal pulses 2+ bilaterally Cardiac:  normal S1, S2; RRR; no murmur  Lungs:  clear to auscultation bilaterally, no wheezing, rhonchi or rales  Abd: soft, nontender, no hepatomegaly  Ext: no edema Musculoskeletal:  No deformities, BUE and BLE strength normal and equal Skin: warm and dry  Neuro:  CNs 2-12 intact, no focal abnormalities noted Psych:  Normal affect   EKG:  The EKG was personally reviewed and demonstrates:  NSR  Telemetry:  Telemetry was personally reviewed and demonstrates:  NSR, no ST changes  Relevant CV Studies: Cath  showed (2/11):  LM: ostial 20%  LAD: Diffuse 40% prox, mid 60-70% focal lesion. D1: 30% tubular lesion.  LCX: gave off a ramus Damarius Karnes, small OM-1. The distal AV groove circ was subtotally occluded which was chronic. In the ramus Chales Pelissier, there was evidence of a previously placed stent. This was now totally occluded. In the OM-1, there was a 40% proximal lesion.  RCA: dominant vessel, had diffuse 40-50% disease throughout. In the distal RCA, prior to the PDA, there was a tubular 40% lesion. In the distal RCA just after the takeoff of the PDA, there was a 99% lesion with a near subtotal occlusion. In the PDA, there was a 50% tubular lesion distally.  LV 20%. Failed PCI of distal RCA.   07/18/2019 EF 40-45% Mild MR  Echo 8/19; EF 30-35% (stable) mild RV dilation.  Laboratory Data:  High Sensitivity Troponin:   Recent Labs  Lab 05/03/22 2133 05/03/22 2251 05/04/22 0031 05/04/22 1107 05/04/22 1258  TROPONINIHS 2,257* 2,445* 2,638* 1,944* 1,767*     Chemistry Recent Labs  Lab 05/03/22 1921 05/03/22 2251 05/04/22 0322  NA 138  --  138  K 4.2  --  4.1  CL 95*  --  98  CO2 31  --  28  GLUCOSE 138*  --  118*  BUN 26*  --  27*  CREATININE 2.82* 2.47* 2.06*  CALCIUM 8.8*  --  8.5*  MG  --   --  2.4  GFRNONAA 25* 29* 36*  ANIONGAP 12  --  12    Recent Labs  Lab 05/03/22 1921  PROT 7.3  ALBUMIN 4.0  AST 47*  ALT 45*  ALKPHOS 64  BILITOT 0.9   Lipids No results for input(s): "CHOL", "TRIG", "HDL", "LABVLDL", "LDLCALC", "CHOLHDL" in the last 168 hours.  Hematology Recent Labs  Lab 05/03/22 1921 05/03/22 2251 05/04/22 0322  WBC 13.2* 11.8* 11.0*  RBC 3.86* 3.76* 3.58*  HGB 13.1 12.6* 12.0*  HCT 40.5 39.0 36.9*  MCV 104.9* 103.7* 103.1*  MCH 33.9 33.5 33.5  MCHC 32.3 32.3 32.5  RDW 16.4* 16.1* 15.9*  PLT 196 165 162   Thyroid No results for input(s): "TSH", "FREET4" in the last 168 hours.  BNP Recent Labs  Lab 05/03/22 1927  BNP 411.9*    DDimer No results for  input(s): "DDIMER" in the last 168 hours.   Radiology/Studies:  DG CHEST PORT 1 VIEW  Result Date: 05/04/2022 CLINICAL DATA:  Difficulty breathing, heart failure EXAM: PORTABLE CHEST 1 VIEW COMPARISON:  Previous  studies including the examination of 05/03/2022 FINDINGS: Transverse diameter of heart is increased. Pacemaker/defibrillator battery is seen in left infraclavicular region. Central pulmonary vessels are less prominent. There are no new infiltrates or signs of pulmonary edema. There is no pleural effusion or pneumothorax. IMPRESSION: Cardiomegaly. There are no signs of pulmonary edema or focal pulmonary consolidation. Electronically Signed   By: Elmer Picker M.D.   On: 05/04/2022 13:06   ECHOCARDIOGRAM COMPLETE  Result Date: 05/04/2022    ECHOCARDIOGRAM REPORT   Patient Name:   [name] Date of Exam: 05/04/2022 Medical Rec #:  [id number]      Height:       72.0 in Accession #:    [id number]     Weight:       237.4 lb Date of Birth:  08-06-1960       BSA:          2.292 m Patient Age:    42 years       BP:           90/58 mmHg Patient Gender: M              HR:           67 bpm. Exam Location:  Inpatient Procedure: 2D Echo, Color Doppler, Cardiac Doppler and Intracardiac            Opacification Agent Indications:    Elevated Troponins  History:        Patient has prior history of Echocardiogram examinations, most                 recent 07/18/2019. CHF, CAD, Defibrillator, COPD; Risk                 Factors:Sleep Apnea and Polysubstance Abuse.  Sonographer:    Raquel Sarna Senior RDCS Referring Phys: [place]  1. Left ventricular ejection fraction, by estimation, is 25 to 30%. The left ventricle has severely decreased function. The left ventricle demonstrates global hypokinesis. The left ventricular internal cavity size was mildly dilated. Left ventricular diastolic parameters are consistent with Grade I diastolic dysfunction (impaired relaxation).  2. Right  ventricular systolic function is mildly reduced. The right ventricular size is normal. There is mildly elevated pulmonary artery systolic pressure.  3. Left atrial size was moderately dilated.  4. Right atrial size was mildly dilated.  5. The mitral valve is normal in structure. No evidence of mitral valve regurgitation. No evidence of mitral stenosis.  6. The aortic valve is tricuspid. Aortic valve regurgitation is not visualized. No aortic stenosis is present.  7. The inferior vena cava is dilated in size with <50% respiratory variability, suggesting right atrial pressure of 15 mmHg.  8. Technically difficult study with poor acoustic windows. FINDINGS  Left Ventricle: Left ventricular ejection fraction, by estimation, is 25 to 30%. The left ventricle has severely decreased function. The left ventricle demonstrates global hypokinesis. Definity contrast agent was given IV to delineate the left ventricular endocardial borders. The left ventricular internal cavity size was mildly dilated. There is no left ventricular hypertrophy. Left ventricular diastolic parameters are consistent with Grade I diastolic dysfunction (impaired relaxation). Right Ventricle: The right ventricular size is normal. No increase in right ventricular wall thickness. Right ventricular systolic function is mildly reduced. There is mildly elevated pulmonary artery systolic pressure. The tricuspid regurgitant velocity  is 2.45 m/s, and with an assumed right atrial pressure of 15 mmHg, the estimated right ventricular systolic pressure is 39.0  mmHg. Left Atrium: Left atrial size was moderately dilated. Right Atrium: Right atrial size was mildly dilated. Pericardium: There is no evidence of pericardial effusion. Mitral Valve: The mitral valve is normal in structure. No evidence of mitral valve regurgitation. No evidence of mitral valve stenosis. Tricuspid Valve: The tricuspid valve is normal in structure. Tricuspid valve regurgitation is trivial.  Aortic Valve: The aortic valve is tricuspid. Aortic valve regurgitation is not visualized. No aortic stenosis is present. Pulmonic Valve: The pulmonic valve was normal in structure. Pulmonic valve regurgitation is not visualized. Aorta: The aortic root is normal in size and structure. Venous: The inferior vena cava is dilated in size with less than 50% respiratory variability, suggesting right atrial pressure of 15 mmHg. IAS/Shunts: No atrial level shunt detected by color flow Doppler. Additional Comments: A device lead is visualized in the right ventricle.  LEFT VENTRICLE PLAX 2D LVIDd:         6.80 cm   Diastology LVIDs:         6.30 cm   LV e' medial:    4.03 cm/s LV PW:         0.80 cm   LV E/e' medial:  21.8 LV IVS:        0.60 cm   LV e' lateral:   7.94 cm/s LVOT diam:     2.10 cm   LV E/e' lateral: 11.1 LV SV:         69 LV SV Index:   30 LVOT Area:     3.46 cm  RIGHT VENTRICLE RV S prime:     10.30 cm/s TAPSE (M-mode): 2.0 cm LEFT ATRIUM              Index        RIGHT ATRIUM           Index LA diam:        4.80 cm  2.09 cm/m   RA Area:     19.40 cm LA Vol (A2C):   89.1 ml  38.88 ml/m  RA Volume:   57.00 ml  24.87 ml/m LA Vol (A4C):   104.0 ml 45.38 ml/m LA Biplane Vol: 97.2 ml  42.41 ml/m  AORTIC VALVE LVOT Vmax:   94.65 cm/s LVOT Vmean:  67.700 cm/s LVOT VTI:    0.200 m  AORTA Ao Root diam: 3.45 cm Ao Asc diam:  3.30 cm MITRAL VALVE                TRICUSPID VALVE MV Area (PHT): 3.02 cm     TR Peak grad:   24.0 mmHg MV Decel Time: 251 msec     TR Vmax:        245.00 cm/s MV E velocity: 87.80 cm/s MV A velocity: 118.00 cm/s  SHUNTS MV E/A ratio:  0.74         Systemic VTI:  0.20 m                             Systemic Diam: 2.10 cm Dalton McleanMD Electronically signed by Franki Monte Signature Date/Time: 05/04/2022/10:19:30 AM    Final    DG Chest Portable 1 View  Result Date: 05/03/2022 CLINICAL DATA:  A 61 year old male presents for evaluation of hypoxia. EXAM: PORTABLE CHEST 1 VIEW  COMPARISON:  February 13, 2021 FINDINGS: EKG leads project over the chest. Cardiomediastinal contours and hilar structures are stable. No signs of pulmonary edema, pneumothorax or consolidation.  On limited assessment there is no acute skeletal process. IMPRESSION: No acute cardiopulmonary disease. Electronically Signed   By: Zetta Bills M.D.   On: 05/03/2022 18:20     Assessment and Plan:   Type II Demand MI: in the setting of drug overdose. No ischemic evaluation recommended at this time. He is stable. Can consider as an outpatient.  Ischemic CM : hx LHC 2/12. 3 V CAD.Failed PCI of distal RCAc  [facility] 4/13 with DES to  LAD X2. S/p St. Jude ICD. Prior EF 25% in the past. Here, it is reduced again. No plans for an ischemic eval now per above in the setting of overdose. Euvolemic - continue prior prescribed farxiga 10 mg  - hold prior planned spironolactone 12.5 mg daily until FU BMET - continue home lisinopril 10 mg daily - continue home metop XL 25 mg daily - If can tolerate this regimen and develops Stage C NYHA Class III or greater HF can consider entresto  CAD - continue home plavix - continue home crestor 40 mg daily  From a cardiology standpoint, he can be discharged. We are working on follow up with heart failure. Has EP appt. Recommend BMET 2 weeks while restarting SGLT2 and ACEI. Can consider spironolactone at that time.   Time Spent Directly with Patient:  I have spent a total of 60 minutes with the patient reviewing hospital notes, telemetry, EKGs, labs and examining the patient as well as establishing an assessment and plan that was discussed personally with the patient.  > 50% of time was spent in direct patient care.    Risk Assessment/Risk Scores:       For questions or updates, please contact [place] Please consult [email or web address] for contact info under    Signed, Janina Mayo, MD  05/04/2022 3:28 PM

## 2022-05-04 NOTE — Discharge Summary (Addendum)
Physician Discharge Summary       Patient ID: [name] MRN: [id number] DOB/AGE: 62-May-1962 62 y.o.  Admission Date: 05/03/2022 Discharge Date: 05/04/2022  Discharge Diagnoses:  Principal Problem:   Methadone overdose [facility])   History of Present Illness:  [name] is a 61 year old man with history of opiate abuse with recent 8-week stay at rehab, recently returned home approximately one week PTA, found unresponsive with Narcan administered in the field with interval improvement now suddenly on Narcan drip with recurrence of depressed mental status, encephalopathy. PMHx significant for HTN, HLD, CAD (s/p PCI with DES to LAD x 2, last LHC 2015 at [facility]), CHF (Echo 2021 with EF 40-45%), ICM s/p ICD placement (08/2010, [employer]), NSTEMI, TIA, OSA on CPAP, chronic back pain, polysubstance abuse with chronic narcotic/benzodiazepine dependence, anxiety/depression, BPH, testicular CA.   Patient stated on admission that he "does not know what happened."  He states he took methadone, but is unsure how many or the dosage; he took this "because he was in a lot of pain." Denied any suicidal ideation or homicidal ideation on admission.  Unfortunately, the methadone patient took is not prescribed to him  Patient was found unresponsive at home. Narcan was administered in the field by a friend with minimal improvement in symptoms and additional Narcan was given.  EMS was called and administered multiple additional doses en route. Patient was subsequently placed on Narcan gtt in ED.   Hospital Course:   PCCM was consulted for admission 10/22. At that time, patient had been placed on Narcan gtt and mental status was improving. Complained of cough which is new since being found unresponsive.  Labs were notable for acute renal failure with creatinine almost 3 (baseline 1.1), leukocytosis (WBC  13.2), BNP and troponin both elevated (troponin peaked at 2683).  Cardiology was consulted and recommended no  intervention at that time. CXR revealed streaky opacities in the right lower lung fields, c/f possible aspiration and Unasyn was initiated.   Patient continued to progress clinically with improvement in mental status and Narcan gtt was discontinued 10/23AM. Echocardiogram was obtained 10/23 demonstrating decreased EF from prior (25-30% from prior 40-45%) and given this finding/persistent troponin elevation, Cardiology was formally consulted with diagnosis of Type II NSTEMI in the setting of drug overdose with no further ischemic evaluation recommended at present.  Additionally, patient was noted to have new O2 requirement, likely in the setting of CHF/ICM and additional insult with NSTEMI related to OD. Patient was deemed to require up to 3L [place] with activity and 1L [place] at rest and this was ordered for patient prior to discharge. Though it was recommended that patient stay in the hospital for additional cardiopulmonary workup, patient was eager to discharge home and arrangements were made for this to occur with the stipulation of close follow up from the Cardiology (HF/EP) and Pulmonary teams.  Discharge Plan by Active Problems:  Unintentional methadone overdose In the setting of severe pain.  History of opiate abuse with recent 8-week rehab stent, back home for approximately 1 week.  Methadone is not prescribed to him. - Narcan gtt discontinued today, mental status improved - Recommend further cardiopulmonary workup; however, patient is adamant to discharge home today. Risks of leaving AMA were discussed in detail by Dr. Loanne Drilling [facility] attending) and patient understood risks of discharge and wished to proceed; PCCM attempted most safe discharge plan possible. - Recommend continued polysubstance cessation counseling/rehabilation   Coronary artery disease with ischemic cardiomyopathy S/p ICD placement 08/2010 Elevated  troponin, plateaued and downtrending Most recent TTE 07/2019 EF 40%. ICD in place. Last  seen by Dr. Caryl Comes 07/2021. Repeat Echo completed 10/23 with EF 25-30%, global hypokinesis, G1DD, mildly reduced RV systolic function. Troponin elevation likely in the setting of stress demand mismatch due to overdose, hypoxemia, exacerbated by AKI. - Continue ASA/Plavix, statin - Per Cardiology, continue Farxiga, Toprol-XL - Hold spironolactone, do not resume until repeat BMP 1-2 weeks - If developing Stage C NYHA Class III or greater HF, consider Entresto - EP f/u appointment in place 10/24, HF follow up pending  Acute renal failure, improving Creatinine 1.13 09/2021, now 2.8.  Suspect element of hypovolemia given recent resumption of torsemide after returning from rehab.  Also possible hypovolemia in the setting of being found down, rhabdomyolysis also possible. CK elevated on admission. - Trend BMP in the outpatient setting, Cr improving at discharge - Monitor UOP - Daily weights at home  OSA Hypoxia with new O2 requirement ?Aspiration PNA Previously on CPAP.  Has not been using the machine, possibly without his machine for several months per his report. Most recent pulmonary note 03/2018 and again he was using BiPAP with 5 L of oxygen at night. - CPAP QHS - Supplemental O2 ordered for home; required up to 3L with ambulation and 1L at rest; new requirement is likely seen in the setting of increased cardiac demand related to OD - Augmentin x 5 days total antibiotic course for ?aspiration PNA   Generalized deconditioning - PT consulted prior to discharge, no HH needs at the time of discharge  Significant Hospital Tests/Studies:  Echo 05/04/2022 IMPRESSIONS  1. Left ventricular ejection fraction, by estimation, is 25 to 30%. The  left ventricle has severely decreased function. The left ventricle  demonstrates global hypokinesis. The left ventricular internal cavity size  was mildly dilated. Left ventricular  diastolic parameters are consistent with Grade I diastolic dysfunction   (impaired relaxation).   2. Right ventricular systolic function is mildly reduced. The right  ventricular size is normal. There is mildly elevated pulmonary artery  systolic pressure.   3. Left atrial size was moderately dilated.   4. Right atrial size was mildly dilated.   5. The mitral valve is normal in structure. No evidence of mitral valve  regurgitation. No evidence of mitral stenosis.   6. The aortic valve is tricuspid. Aortic valve regurgitation is not  visualized. No aortic stenosis is present.   7. The inferior vena cava is dilated in size with <50% respiratory  variability, suggesting right atrial pressure of 15 mmHg.   8. Technically difficult study with poor acoustic windows.   Consults: PCCM, Cardiology, Pharmacy (Med Rec)  Discharge Exam: BP 111/63   Pulse 83   Temp (!) 97.2 F (36.2 C) (Oral)   Resp 14   Ht 6' (1.829 m)   Wt 107.7 kg   SpO2 95%   BMI 32.20 kg/m   General: Chronically ill-appearing middle-aged man in NAD. Conversant. HEENT: Sac/AT, anicteric sclera, PERRL, dry mucous membranes. [place] in place. Neuro: Awake, oriented x 4. Responds to verbal stimuli. Following commands consistently. Moves all 4 extremities spontaneously. Generalized weakness. CV: RRR, no m/g/r. PULM: Breathing even and unlabored on 3LNC. Lung fields diminished at bases, R > L. GI: Soft, nontender, nondistended. Normoactive bowel sounds. Extremities: Trace bilateral LE edema noted. Skin: Warm/dry, no rashes.  Labs at Discharge: Lab Results  Component Value Date   CREATININE 2.06 (H) 05/04/2022   BUN 27 (H) 05/04/2022   NA 138  05/04/2022   K 4.1 05/04/2022   CL 98 05/04/2022   CO2 28 05/04/2022   Lab Results  Component Value Date   WBC 11.0 (H) 05/04/2022   HGB 12.0 (L) 05/04/2022   HCT 36.9 (L) 05/04/2022   MCV 103.1 (H) 05/04/2022   PLT 162 05/04/2022   Lab Results  Component Value Date   ALT 45 (H) 05/03/2022   AST 47 (H) 05/03/2022   ALKPHOS 64 05/03/2022    BILITOT 0.9 05/03/2022   Lab Results  Component Value Date   INR 1.1 09/17/2021   INR 1.2 02/14/2021   INR 0.98 03/11/2018   Current Radiology Studies: DG CHEST PORT 1 VIEW  Result Date: 05/04/2022 CLINICAL DATA:  Difficulty breathing, heart failure EXAM: PORTABLE CHEST 1 VIEW COMPARISON:  Previous studies including the examination of 05/03/2022 FINDINGS: Transverse diameter of heart is increased. Pacemaker/defibrillator battery is seen in left infraclavicular region. Central pulmonary vessels are less prominent. There are no new infiltrates or signs of pulmonary edema. There is no pleural effusion or pneumothorax. IMPRESSION: Cardiomegaly. There are no signs of pulmonary edema or focal pulmonary consolidation. Electronically Signed   By: Elmer Picker M.D.   On: 05/04/2022 13:06   ECHOCARDIOGRAM COMPLETE  Result Date: 05/04/2022    ECHOCARDIOGRAM REPORT   Patient Name:   MAYCEN DEGREGORY Date of Exam: 05/04/2022 Medical Rec #:  [id number]      Height:       72.0 in Accession #:    [id number]     Weight:       237.4 lb Date of Birth:  Feb 02, 1961       BSA:          2.292 m Patient Age:    76 years       BP:           90/58 mmHg Patient Gender: M              HR:           67 bpm. Exam Location:  Inpatient Procedure: 2D Echo, Color Doppler, Cardiac Doppler and Intracardiac            Opacification Agent Indications:    Elevated Troponins  History:        Patient has prior history of Echocardiogram examinations, most                 recent 07/18/2019. CHF, CAD, Defibrillator, COPD; Risk                 Factors:Sleep Apnea and Polysubstance Abuse.  Sonographer:    Raquel Sarna Senior RDCS Referring Phys: [place]  1. Left ventricular ejection fraction, by estimation, is 25 to 30%. The left ventricle has severely decreased function. The left ventricle demonstrates global hypokinesis. The left ventricular internal cavity size was mildly dilated. Left ventricular diastolic  parameters are consistent with Grade I diastolic dysfunction (impaired relaxation).  2. Right ventricular systolic function is mildly reduced. The right ventricular size is normal. There is mildly elevated pulmonary artery systolic pressure.  3. Left atrial size was moderately dilated.  4. Right atrial size was mildly dilated.  5. The mitral valve is normal in structure. No evidence of mitral valve regurgitation. No evidence of mitral stenosis.  6. The aortic valve is tricuspid. Aortic valve regurgitation is not visualized. No aortic stenosis is present.  7. The inferior vena cava is dilated in size with <50% respiratory variability, suggesting right atrial pressure  of 15 mmHg.  8. Technically difficult study with poor acoustic windows. FINDINGS  Left Ventricle: Left ventricular ejection fraction, by estimation, is 25 to 30%. The left ventricle has severely decreased function. The left ventricle demonstrates global hypokinesis. Definity contrast agent was given IV to delineate the left ventricular endocardial borders. The left ventricular internal cavity size was mildly dilated. There is no left ventricular hypertrophy. Left ventricular diastolic parameters are consistent with Grade I diastolic dysfunction (impaired relaxation). Right Ventricle: The right ventricular size is normal. No increase in right ventricular wall thickness. Right ventricular systolic function is mildly reduced. There is mildly elevated pulmonary artery systolic pressure. The tricuspid regurgitant velocity  is 2.45 m/s, and with an assumed right atrial pressure of 15 mmHg, the estimated right ventricular systolic pressure is 83.3 mmHg. Left Atrium: Left atrial size was moderately dilated. Right Atrium: Right atrial size was mildly dilated. Pericardium: There is no evidence of pericardial effusion. Mitral Valve: The mitral valve is normal in structure. No evidence of mitral valve regurgitation. No evidence of mitral valve stenosis. Tricuspid  Valve: The tricuspid valve is normal in structure. Tricuspid valve regurgitation is trivial. Aortic Valve: The aortic valve is tricuspid. Aortic valve regurgitation is not visualized. No aortic stenosis is present. Pulmonic Valve: The pulmonic valve was normal in structure. Pulmonic valve regurgitation is not visualized. Aorta: The aortic root is normal in size and structure. Venous: The inferior vena cava is dilated in size with less than 50% respiratory variability, suggesting right atrial pressure of 15 mmHg. IAS/Shunts: No atrial level shunt detected by color flow Doppler. Additional Comments: A device lead is visualized in the right ventricle.  LEFT VENTRICLE PLAX 2D LVIDd:         6.80 cm   Diastology LVIDs:         6.30 cm   LV e' medial:    4.03 cm/s LV PW:         0.80 cm   LV E/e' medial:  21.8 LV IVS:        0.60 cm   LV e' lateral:   7.94 cm/s LVOT diam:     2.10 cm   LV E/e' lateral: 11.1 LV SV:         69 LV SV Index:   30 LVOT Area:     3.46 cm  RIGHT VENTRICLE RV S prime:     10.30 cm/s TAPSE (M-mode): 2.0 cm LEFT ATRIUM              Index        RIGHT ATRIUM           Index LA diam:        4.80 cm  2.09 cm/m   RA Area:     19.40 cm LA Vol (A2C):   89.1 ml  38.88 ml/m  RA Volume:   57.00 ml  24.87 ml/m LA Vol (A4C):   104.0 ml 45.38 ml/m LA Biplane Vol: 97.2 ml  42.41 ml/m  AORTIC VALVE LVOT Vmax:   94.65 cm/s LVOT Vmean:  67.700 cm/s LVOT VTI:    0.200 m  AORTA Ao Root diam: 3.45 cm Ao Asc diam:  3.30 cm MITRAL VALVE                TRICUSPID VALVE MV Area (PHT): 3.02 cm     TR Peak grad:   24.0 mmHg MV Decel Time: 251 msec     TR Vmax:  245.00 cm/s MV E velocity: 87.80 cm/s MV A velocity: 118.00 cm/s  SHUNTS MV E/A ratio:  0.74         Systemic VTI:  0.20 m                             Systemic Diam: 2.10 cm Dalton McleanMD Electronically signed by Franki Monte Signature Date/Time: 05/04/2022/10:19:30 AM    Final    DG Chest Portable 1 View  Result Date: 05/03/2022 CLINICAL  DATA:  A 61 year old male presents for evaluation of hypoxia. EXAM: PORTABLE CHEST 1 VIEW COMPARISON:  February 13, 2021 FINDINGS: EKG leads project over the chest. Cardiomediastinal contours and hilar structures are stable. No signs of pulmonary edema, pneumothorax or consolidation. On limited assessment there is no acute skeletal process. IMPRESSION: No acute cardiopulmonary disease. Electronically Signed   By: Zetta Bills M.D.   On: 05/03/2022 18:20    Disposition:  Discharge disposition: 01-Home or Self Care       Discharge Instructions     (HEART FAILURE PATIENTS) Call MD:  Anytime you have any of the following symptoms: 1) 3 pound weight gain in 24 hours or 5 pounds in 1 week 2) shortness of breath, with or without a dry hacking cough 3) swelling in the hands, feet or stomach 4) if you have to sleep on extra pillows at night in order to breathe.   Complete by: As directed    Call MD for:  difficulty breathing, headache or visual disturbances   Complete by: As directed    Call MD for:  extreme fatigue   Complete by: As directed    Call MD for:  hives   Complete by: As directed    Call MD for:  persistant dizziness or light-headedness   Complete by: As directed    Call MD for:  persistant nausea and vomiting   Complete by: As directed    Call MD for:  severe uncontrolled pain   Complete by: As directed    Call MD for:  temperature >100.4   Complete by: As directed    Diet - low sodium heart healthy   Complete by: As directed    For home use only DME oxygen   Complete by: As directed    Length of Need: 12 Months   Mode or (Route): Nasal cannula   Liters per Minute: 3   Frequency: Continuous (stationary and portable oxygen unit needed)   Oxygen delivery system: Gas   Increase activity slowly   Complete by: As directed       Allergies as of 05/04/2022       Reactions   Darvocet [propoxyphene N-acetaminophen] Anaphylaxis   Throat closes   Depakote [divalproex Sodium]  Other (See Comments)   Coded- ended up on a vent   Haldol [haloperidol] Other (See Comments)   Coded- ended up on a vent   Propoxyphene Anaphylaxis, Swelling        Medication List     STOP taking these medications    albuterol 108 (90 Base) MCG/ACT inhaler Commonly known as: Ventolin HFA   ALPRAZolam 0.5 MG tablet Commonly known as: XANAX   cloNIDine 0.2 MG tablet Commonly known as: CATAPRES   cyanocobalamin 1000 MCG/ML injection Commonly known as: VITAMIN B12   diazepam 10 MG tablet Commonly known as: VALIUM   ezetimibe 10 MG tablet Commonly known as: ZETIA   lactulose 10 GM/15ML solution Commonly known as:  CHRONULAC   lisinopril 10 MG tablet Commonly known as: ZESTRIL   methocarbamol 750 MG tablet Commonly known as: ROBAXIN   naproxen 500 MG tablet Commonly known as: NAPROSYN   nitroGLYCERIN 0.4 MG/SPRAY spray Commonly known as: NITROLINGUAL   ondansetron 8 MG disintegrating tablet Commonly known as: ZOFRAN-ODT   pantoprazole 40 MG tablet Commonly known as: PROTONIX   Quviviq 25 MG Tabs Generic drug: Daridorexant HCl   rosuvastatin 40 MG tablet Commonly known as: CRESTOR   sildenafil 100 MG tablet Commonly known as: VIAGRA   spironolactone 25 MG tablet Commonly known as: ALDACTONE   triamcinolone 55 MCG/ACT Aero nasal inhaler Commonly known as: Nasacort AQ   Vitamin D3 125 MCG (5000 UT) Caps   zolpidem 10 MG tablet Commonly known as: AMBIEN       TAKE these medications    Afrin 12 Hour 0.05 % nasal spray Generic drug: oxymetazoline Place 1 spray into both nostrils 2 (two) times daily as needed for congestion.   amoxicillin-clavulanate 875-125 MG tablet Commonly known as: AUGMENTIN Take 1 tablet by mouth 2 (two) times daily.   aspirin EC 81 MG tablet Take 81 mg by mouth in the morning. Swallow whole.   atorvastatin 20 MG tablet Commonly known as: LIPITOR Take 20 mg by mouth daily.   calcium carbonate 500 MG chewable  tablet Commonly known as: TUMS - dosed in mg elemental calcium Chew 1-2 tablets by mouth daily as needed for indigestion or heartburn.   clopidogrel 75 MG tablet Commonly known as: PLAVIX TAKE ONE TABLET BY MOUTH DAILY What changed:  how much to take how to take this when to take this additional instructions   dapagliflozin propanediol 10 MG Tabs tablet Commonly known as: [place] Discuss with Cardiology providers before resuming this medication. What changed:  how much to take how to take this when to take this additional instructions   furosemide 20 MG tablet Commonly known as: LASIX Take 1 tablet (20 mg total) by mouth daily. What changed:  when to take this reasons to take this   gabapentin 100 MG capsule Commonly known as: NEURONTIN Take 300 mg by mouth 3 (three) times daily.   ibuprofen 200 MG tablet Commonly known as: ADVIL Take 800 mg by mouth every 6 (six) hours as needed for moderate pain.   Magnesium 250 MG Tabs Take 250 mg by mouth daily.   metoprolol succinate 25 MG 24 hr tablet Commonly known as: TOPROL-XL Take 25 mg by mouth daily.   naloxone 4 MG/0.1ML Liqd nasal spray kit Commonly known as: Narcan Place 1 spray into the nose once as needed (for opioid emergency).   Pepcid Complete 10-800-165 MG chewable tablet Generic drug: famotidine-calcium carbonate-magnesium hydroxide Chew 1 tablet by mouth daily as needed (for reflux).   tamsulosin 0.4 MG Caps capsule Commonly known as: FLOMAX Take 1 capsule (0.4 mg total) by mouth daily after breakfast. What changed: when to take this   Testosterone Cypionate 200 MG/ML Kit Inject 200 mg into the muscle every 14 (fourteen) days.   torsemide 20 MG tablet Commonly known as: DEMADEX TAKE ONE TABLET DAILY AS NEEDED FOR SWELLING IN LEGS. What changed: See the new instructions.   traZODone 100 MG tablet Commonly known as: DESYREL Take 1 tablet (100 mg total) by mouth at bedtime. What changed: how much  to take               Durable Medical Equipment  (From admission, onward)  Start     Ordered   05/04/22 0000  For home use only DME oxygen       Question Answer Comment  Length of Need 12 Months   Mode or (Route) Nasal cannula   Liters per Minute 3   Frequency Continuous (stationary and portable oxygen unit needed)   Oxygen delivery system Gas      05/04/22 [place] Follow up.   Why: A representative with [facility] will contact you regarding home oxygen. Contact information: [place] [address] [phone]                Discharged Condition: fair, recommended against discharge home; patient insisting on leaving AMA.  65 minutes of time have been dedicated to discharge assessment, planning and discharge instructions.   Signed:  Rhae Lerner [facility] 05/04/22 6:20 PM  Please see [email or web address] for pager details.  From 7A-7P if no response, please call [phone] After hours, please call ELink [phone]

## 2022-05-04 NOTE — Progress Notes (Signed)
eLink Physician-Brief Progress Note Patient Name: [name] DOB: [birth year] MRN: [id number]   Date of Service  05/04/2022  HPI/Events of Note  66 M hx of of opiate abuse, HFrEF 40% s/p AICD, OSA not complaint with CPAP, recently returned home from rehab, found unresponsive with improvement after Narcan and eventually placed on drip. Patient states he took methadone due to pain but unclear how much he took.  Trop 2445, CL 985, creatinine 2.47, lactic acid 2.1  Seen awake talking on his phone  eICU Interventions  Unintentional methadone OD, wean Narcan drip as appropriate AKI continue fluids NSTEMI, troponin without exponential increase. Cardiology consulted in the ED with no plans for intervention     Intervention Category Evaluation Type: New Patient Evaluation  Judd Lien 05/04/2022, 1:42 AM

## 2022-05-04 NOTE — Progress Notes (Signed)
Pt declined BIPAP at this time. Pt resting comfortably and continues to wear 3L [place] with no respiratory complaints, SP02 remains > 92%.

## 2022-05-04 NOTE — Discharge Instructions (Addendum)
Acute hypoxemic respiratory failure for aspiration pneumonitis/pneumonia >Complete Augmentin x 5 days total. Take as directed >Wear 1L of oxygen with rest  >Wear 3L of oxygen with activity and sleep  History of ischemic cardiomyopathy s/p ICD. Worsening EF to 25% again >Cardiology consulted. No plans for ischemic evaluation now >Team arranging follow-up with Heart Failure clinic >Keep follow-up with EP clinic tomorrow  Recommend follow-up with your primary care physician within 1 week  >Need to recheck your oxygen requirement  >Recheck renal function

## 2022-05-04 NOTE — ED Notes (Signed)
[place], MD made aware of soft BP. Bolus ordered.

## 2022-05-04 NOTE — Progress Notes (Signed)
NAME:  [name], MRN:  [id number], DOB:  1961/02/02, LOS: 1 ADMISSION DATE:  05/03/2022, CONSULTATION DATE:  05/03/2022 REFERRING MD: Billy Fischer - EDP, CHIEF COMPLAINT: Pain  History of Present Illness:  61 year old man with history of opiate abuse with recent 8-week stay at rehab, recently returned home approximately a week prior, found unresponsive with Narcan administered in the field with interval improvement now suddenly on Narcan drip with recurrence of depressed mental status, encephalopathy.  Patient recent return home from a week stay at rehab.  When asked he states he does not know what happened.  He states he took methadone.  He is unsure how many or the dosage.  He states he took this because he was in a lot of pain.  He denies any suicidal ideation or homicidal ideation.  The methadone is not prescribed to him.  He was reported found unresponsive.  Narcan was administered in the field by friend.  Minimal improvement.  More given.  EMS called.  Multiple doses in route.  And additional doses in the ED.  Subseqently put on Narcan drip.  He complains of cough which is new since being found unresponsive.  Labs notable for acute renal failure with creatinine almost 3 from baseline 1.1.  Leukocytosis is present.  BNP and troponin both elevated.  Cardiology was consulted and recommended no intervention at this time.  Chest x-ray on my review interpretation reveals streaky opacities in the right lower lung fields.  Pertinent  Medical History  Ischemic cardiomyopathy, OSA reportedly on BiPAP, history of opiate abuse disorder  Significant Hospital Events: Including procedures, antibiotic start and stop dates in addition to other pertinent events   05/03/2022 admitted to the hospital after being found unresponsive by ex-wife, Narcan administered in the field with interval improvement with subsequent decline in mental status, repeat Narcan doses in the ED with subsequent Narcan infusion  started 10/23 Mental status improved, vitals stable off of Narcan gtt.  Interim History / Subjective:  No significant events overnight Mental status clear, improved Wants to go home Denies CP, SOB, pain Narcan gtt off, continue to monitor Last troponin/LA still elevated, repeat and ensure downtrending/plateaued Echo grossly unchanged from prior PT evaluation prior to discharge  Objective:  Blood pressure 99/78, pulse 66, temperature 98.3 F (36.8 C), temperature source Axillary, resp. rate 17, height 6' (1.829 m), weight 107.7 kg, SpO2 95 %.        Intake/Output Summary (Last 24 hours) at 05/04/2022 0729 Last data filed at 05/04/2022 0600 Gross per 24 hour  Intake 843.81 ml  Output --  Net 843.81 ml    Filed Weights   05/04/22 0130  Weight: 107.7 kg   Physical Examination: General: Chronically ill-appearing older man in NAD. HEENT: [place]/AT, anicteric sclera, PERRL, moist mucous membranes. Neuro: Awake, oriented x 4. Responds to verbal stimuli. Following commands consistently. Moves all 4 extremities spontaneously. CV: RRR, no m/g/r. PULM: Breathing even and unlabored on 3LNC, though nasal prongs not in nose. Lung fields CTAB. GI: Soft, nontender, nondistended. Normoactive bowel sounds. Extremities: No LE edema noted. Skin: Warm/dry, no rashes.  Resolved Hospital Problem List:     Assessment & Plan:  Unintentional methadone overdose In the setting of severe pain.  History of opiate abuse with recent 8-week rehab stent, back home for approximately 1 week.  Methadone is not prescribed to him. - Narcan gtt discontinued today, monitor off - Likely stable for transfer to floor versus discharge home, pending additional workup (trop/LA)  Coronary artery  disease with ischemic cardiomyopathy Most recent TTE 07/2019 EF 40%. ICD in place. Last seen by Dr. Caryl Comes 07/2021. - Repeat Echo completed 10/23 with EF 25-30%, global hypokinesis, G1DD, mildly reduced RV systolic function. -  Continue ASA/Plavix - Hold additional GDMT for now, would benefit from pharmacy med rec  Elevated troponin Likely in the setting of stress demand mismatch due to overdose, hypoxemia, exacerbated by AKI. - Trend troponin to plateau/downtrend - Case reportedly discussed with Cardiology by EDP with no recommendations; would consider re-engaging Cards/HF team if troponin persistently elevated; fortunately patient is asymptomatic at this time  Acute renal failure, improving Creatinine 1.13 09/2021, now 2.8.  Suspect element of hypovolemia given recent resumption of torsemide after returning from rehab.  Also possible hypovolemia in the setting of being found down, rhabdomyolysis also possible. CK elevated on admission. - Trend BMP, Cr improving - Replete electrolytes as indicated - Monitor I&Os - Avoid nephrotoxic agents as able - Ensure adequate renal perfusion  OSA Previously on CPAP.  Has not been using the machine, possibly without his machine for several months per his report. Most recent pulmonary note 03/2018 and again he was using BiPAP with 5 L of oxygen at night. - BiPAP QHS  Generalized deconditioning - PT consult prior to discharge - May benefit from ongoing HHPT/OT  Best Practice (right click and "Reselect all SmartList Selections" daily)   Diet/type: NPO w/ oral meds DVT prophylaxis: LMWH GI prophylaxis: N/A Lines: N/A Foley:  N/A Code Status:  full code Last date of multidisciplinary goals of care discussion [Updated patient at bedside 10/23AM]  Critical care time:    The patient is critically ill with multiple organ system failure and requires high complexity decision making for assessment and support, frequent evaluation and titration of therapies, advanced monitoring, review of radiographic studies and interpretation of complex data.   Critical Care Time devoted to patient care services, exclusive of separately billable procedures, described in this note is 34  minutes.  Lestine Mount, PA-C  Pulmonary & Critical Care 05/04/22 7:29 AM  Please see [email or web address] for pager details.  From 7A-7P if no response, please call [phone] After hours, please call ELink [phone]

## 2022-05-04 NOTE — Progress Notes (Addendum)
SATURATION QUALIFICATIONS: (This note is used to comply with regulatory documentation for home oxygen)  Patient Saturations on Room Air at Rest = 88%  Patient Saturations on Room Air while Ambulating = 85%  Patient Saturations on 3 Liters of oxygen while Ambulating = 92 %  Please briefly explain why patient needs home oxygen: Need for home oxygen due to low Saturation on RA at rest and while ambulating.

## 2022-05-04 NOTE — Evaluation (Signed)
Physical Therapy Evaluation Patient Details Name: [name] MRN: [id number] DOB: 07/30/1960 Today's Date: 05/04/2022  History of Present Illness  61 year old male with a history of chronic systolic heart failure with an ejection fraction 20 to 25%, status post [place] ICD, coronary artery disease, substance abuse, who per EMS had recently left rehab for polysubstance and opiate addiction 1 week ago, who presents with concern for methadone overdose on 05/03/22.  Clinical Impression      The patient admitted for above medical problems.   Patient presents with H/O right Foot drop, impaired balance and reports multiple falls. Patient ambulates with rollator at baseline,   Patient   did mobilize with min guard to ambulate  short distance in room, noted right  foot drop/compensates.  Patient reports  eagerness to Dc home.  Patient was on 3 l [place] at 95%, ambulated to BR on RA, not able to see pleth , Box in BR, patient had removed sensor from finger, appeared dropped down into 80's. O2 replaced at 3 L with return to 94%. RN aware and in room./ Pt admitted with above diagnosis.  Pt currently with functional limitations due to the deficits listed below (see PT Problem List). Pt will benefit from skilled PT to increase their independence and safety with mobility to allow discharge to the venue listed below.       Recommendations for follow up therapy are one component of a multi-disciplinary discharge planning process, led by the attending physician.  Recommendations may be updated based on patient status, additional functional criteria and insurance authorization.  Follow Up Recommendations No PT follow up (pt reports he has a trainer)      Assistance Recommended at Discharge Intermittent Supervision/Assistance  Patient can return home with the following  A little help with walking and/or transfers;Help with stairs or ramp for entrance;Assistance with cooking/housework;Assist for  transportation    Equipment Recommendations None recommended by PT  Recommendations for Other Services       Functional Status Assessment Patient has had a recent decline in their functional status and demonstrates the ability to make significant improvements in function in a reasonable and predictable amount of time.     Precautions / Restrictions Precautions Precaution Comments: ICD, right foot drop, 30-35% EF      Mobility  Bed Mobility Overal bed mobility: Modified Independent                  Transfers Overall transfer level: Needs assistance Equipment used: Rolling walker (2 wheels) Transfers: Sit to/from Stand Sit to Stand: Min guard           General transfer comment: cues for safety, lines, slow down    Ambulation/Gait Ambulation/Gait assistance: Min guard Gait Distance (Feet): 15 Feet (x 2) Assistive device: Rolling walker (2 wheels) Gait Pattern/deviations: Step-to pattern, Decreased dorsiflexion - right, Steppage Gait velocity: decr     General Gait Details: steppage on R, has a brace, does not wear much  Stairs            Wheelchair Mobility    Modified Rankin (Stroke Patients Only)       Balance Overall balance assessment: History of Falls, Needs assistance Sitting-balance support: No upper extremity supported, Feet supported Sitting balance-Leahy Scale: Good     Standing balance support: During functional activity, Single extremity supported Standing balance-Leahy Scale: Fair Standing balance comment: at toilet  Pertinent Vitals/Pain Pain Assessment Pain Assessment: No/denies pain    Home Living Family/patient expects to be discharged to:: Private residence Living Arrangements: Spouse/significant other Available Help at Discharge: Family Type of Home: House Home Access: Ramped entrance       Home Layout: Multi-level;Able to live on main level with bedroom/bathroom Home  Equipment: Rollator (4 wheels);Shower seat;Wheelchair - manual      Prior Function Prior Level of Function : Needs assist;Independent/Modified Independent       Physical Assist : Mobility (physical)     Mobility Comments: mod I at  baseline generally,       Hand Dominance   Dominant Hand: Right    Extremity/Trunk Assessment   Upper Extremity Assessment Upper Extremity Assessment: Overall WFL for tasks assessed    Lower Extremity Assessment Lower Extremity Assessment: RLE deficits/detail;LLE deficits/detail RLE Deficits / Details: foot drop RLE Sensation: history of peripheral neuropathy LLE Sensation: history of peripheral neuropathy    Cervical / Trunk Assessment Cervical / Trunk Assessment: Normal  Communication   Communication: No difficulties  Cognition Arousal/Alertness: Awake/alert Behavior During Therapy: WFL for tasks assessed/performed, Impulsive Overall Cognitive Status: Within Functional Limits for tasks assessed                                          General Comments      Exercises     Assessment/Plan    PT Assessment Patient needs continued PT services  PT Problem List Decreased strength;Decreased mobility;Decreased safety awareness;Decreased knowledge of precautions;Decreased activity tolerance;Cardiopulmonary status limiting activity;Decreased knowledge of use of DME;Decreased balance       PT Treatment Interventions DME instruction;Therapeutic activities;Gait training;Therapeutic exercise;Patient/family education;Functional mobility training    PT Goals (Current goals can be found in the Care Plan section)  Acute Rehab PT Goals Patient Stated Goal: to go home today PT Goal Formulation: With patient/family Time For Goal Achievement: 05/18/22 Potential to Achieve Goals: Good    Frequency Min 3X/week     Co-evaluation               AM-PAC PT "6 Clicks" Mobility  Outcome Measure Help needed turning from your  back to your side while in a flat bed without using bedrails?: None Help needed moving from lying on your back to sitting on the side of a flat bed without using bedrails?: None Help needed moving to and from a bed to a chair (including a wheelchair)?: A Little Help needed standing up from a chair using your arms (e.g., wheelchair or bedside chair)?: A Little Help needed to walk in hospital room?: A Little Help needed climbing 3-5 steps with a railing? : Total 6 Click Score: 18    End of Session Equipment Utilized During Treatment: Gait belt Activity Tolerance: Patient tolerated treatment well Patient left: in chair;with call bell/phone within reach;with family/visitor present;with nursing/sitter in room Nurse Communication: Mobility status PT Visit Diagnosis: Unsteadiness on feet (R26.81);Other symptoms and signs involving the nervous system (R29.898)    Time: 1530-1600 PT Time Calculation (min) (ACUTE ONLY): 30 min   Charges:   PT Evaluation $PT Eval Low Complexity: 1 Low PT Treatments $Gait Training: 8-22 mins        [place] Office [phone] Weekend [phone]   Claretha Cooper 05/04/2022, 4:26 PM

## 2022-05-04 NOTE — Progress Notes (Signed)
PCCM Progress Note  Patient stating he will leave AMA. We discussed risks (worsening of condition, death) of leaving AMA without clinical improvement and full work-up including his new oxygen requirement and depressed EF and AKI. After discussion with Cardiology, consult team ok to discharge for cardiology standpoint and will arrange follow-up in heart failure clinic for him. For his O2 requirement will treat for presumed aspiration pneumonia with strict instructions for oxygen compliance. Patient and wife have been advised verbally and on discharge handout to schedule follow-up with PCP to recheck oxygen status, renal function and medication restart.  Patient is leaving AMA with primary team arranging safest discharge plan as possible however this is not the recommended management.  Patient and wife express understanding of the risks and wish to continue to leave.  Rodman Pickle, M.D. [facility] Pulmonary/Critical Care Medicine 05/04/2022 5:52 PM

## 2022-05-04 NOTE — Progress Notes (Signed)
[facility] Progress Note Patient Name: WILBERN PENNYPACKER DOB: 12/10/1960 MRN: [id number]   Date of Service  05/04/2022  HPI/Events of Note  Patient is requesting for his trazadone he normal takes at home. Also requesting something for indigestion. Normally take protonix at home.   Seen dozing off  eICU Interventions  One time dose of melatonin prn ordered Protonix ordered     Intervention Category Minor Interventions: Routine modifications to care plan (e.g. PRN medications for pain, fever)  Leathia Farnell T Morgen Ritacco 05/04/2022, 2:52 AM

## 2022-05-04 NOTE — Plan of Care (Addendum)
Patient discharged to home with Home O2, patient understands discharge instructions and the need to follow up with PCP.

## 2022-05-04 NOTE — Progress Notes (Signed)
  Transition of Care [facility]) Screening Note   Patient Details  Name: DAVELLE ANSELMI Date of Birth: [birth year]   Transition of Care [facility]) CM/SW Contact:    Roseanne Kaufman, RN Phone Number: 05/04/2022, 4:43 PM    Transition of Care Department [facility]) has reviewed patient and no TOC needs have been identified at this time. We will continue to monitor patient advancement through interdisciplinary progression rounds. If new patient transition needs arise, please place a TOC consult.

## 2022-05-05 ENCOUNTER — Encounter: Payer: BC Managed Care – PPO | Admitting: Student

## 2022-05-05 DIAGNOSIS — I255 Ischemic cardiomyopathy: Secondary | ICD-10-CM

## 2022-05-05 DIAGNOSIS — I472 Ventricular tachycardia, unspecified: Secondary | ICD-10-CM

## 2022-05-05 DIAGNOSIS — I5022 Chronic systolic (congestive) heart failure: Secondary | ICD-10-CM

## 2022-05-06 ENCOUNTER — Telehealth: Payer: Self-pay | Admitting: Physician Assistant

## 2022-05-06 ENCOUNTER — Telehealth (HOSPITAL_COMMUNITY): Payer: Self-pay | Admitting: Internal Medicine

## 2022-05-06 NOTE — Telephone Encounter (Signed)
   The patient called the answering service after-hours today. He was discharged 10/23 after complex admission, sent home on O2, multiple meds stopped due to AKI as well. Tonight he would like to know whether to restart all his medicines and if he can discontinue oxygen. He also thought he was supposed to stop Plavix but I clarified he is to remain on that. I told him I would otherwise not acutely restart anything without him being see in follow-up with labs as advised. His next follow-up with CHF clinic is not until end of November so I will route to Dr. Haroldine Laws to advise on sooner follow-up. I also told him I would not stop using oxygen until he is seen back by PCP for re-evaluation. The patient verbalized understanding and gratitude.  Charlie Pitter, PA-C

## 2022-05-06 NOTE — Telephone Encounter (Signed)
Pt request call back regarding medication change please call [phone]

## 2022-05-06 NOTE — Telephone Encounter (Signed)
Called pt no answer. Voicemail full.

## 2022-05-07 ENCOUNTER — Telehealth (HOSPITAL_COMMUNITY): Payer: Self-pay | Admitting: *Deleted

## 2022-05-07 NOTE — Telephone Encounter (Signed)
Pt left vm asking if he needed to have repeat labs and asked that Dr.Bensimhon look at his hospital discharge and let him know which medications he can restart.  Routed to [place]

## 2022-05-08 NOTE — Telephone Encounter (Signed)
Patient was in the hospital and cancelled 10/24 appointment with A. Tillery, PA-C.  Has Device check appointment next week on 11/3 here in clinic.  Will flag this for device team covering.  Also, patient may need to be set up for follow up with provider since 10/24 was cancelled and patient is post hosp discharge.

## 2022-05-09 LAB — CULTURE, BLOOD (ROUTINE X 2)
Culture: NO GROWTH
Culture: NO GROWTH
Special Requests: ADEQUATE
Special Requests: ADEQUATE

## 2022-05-12 ENCOUNTER — Encounter (HOSPITAL_COMMUNITY): Payer: Self-pay | Admitting: *Deleted

## 2022-05-12 NOTE — Telephone Encounter (Signed)
Called pt no answer. Mychart msg sent.

## 2022-05-13 ENCOUNTER — Ambulatory Visit (INDEPENDENT_AMBULATORY_CARE_PROVIDER_SITE_OTHER): Payer: BC Managed Care – PPO

## 2022-05-13 DIAGNOSIS — I255 Ischemic cardiomyopathy: Secondary | ICD-10-CM

## 2022-05-13 LAB — CUP PACEART REMOTE DEVICE CHECK
Battery Remaining Longevity: 98 mo
Battery Remaining Percentage: 90 %
Battery Voltage: 3.02 V
Brady Statistic AP VP Percent: 1 %
Brady Statistic AP VS Percent: 1 %
Brady Statistic AS VP Percent: 1 %
Brady Statistic AS VS Percent: 98 %
Brady Statistic RA Percent Paced: 1 %
Brady Statistic RV Percent Paced: 1 %
Date Time Interrogation Session: 20231101021443
HighPow Impedance: 61 Ohm
Implantable Lead Connection Status: 753985
Implantable Lead Connection Status: 753985
Implantable Lead Implant Date: 20120201
Implantable Lead Implant Date: 20120201
Implantable Lead Location: 753859
Implantable Lead Location: 753860
Implantable Pulse Generator Implant Date: 20230131
Lead Channel Impedance Value: 390 Ohm
Lead Channel Impedance Value: 390 Ohm
Lead Channel Pacing Threshold Amplitude: 0.75 V
Lead Channel Pacing Threshold Amplitude: 1.75 V
Lead Channel Pacing Threshold Pulse Width: 0.5 ms
Lead Channel Pacing Threshold Pulse Width: 0.7 ms
Lead Channel Sensing Intrinsic Amplitude: 11.8 mV
Lead Channel Sensing Intrinsic Amplitude: 3.8 mV
Lead Channel Setting Pacing Amplitude: 2 V
Lead Channel Setting Pacing Amplitude: 2.5 V
Lead Channel Setting Pacing Pulse Width: 0.7 ms
Lead Channel Setting Sensing Sensitivity: 0.5 mV
Pulse Gen Serial Number: 111050201
Zone Setting Status: 755011

## 2022-05-21 ENCOUNTER — Telehealth: Payer: Self-pay | Admitting: Internal Medicine

## 2022-05-21 DIAGNOSIS — E291 Testicular hypofunction: Secondary | ICD-10-CM | POA: Diagnosis not present

## 2022-05-21 NOTE — Telephone Encounter (Signed)
Pt c/o medication issue:  1. Name of Medication:  torsemide (DEMADEX) 20 MG tablet Lisinopril 2.5 MG tablet clopidogrel (PLAVIX) 75 MG tablet  2. How are you currently taking this medication (dosage and times per day)?   3. Are you having a reaction (difficulty breathing--STAT)?   4. What is your medication issue?   Patient would like to go over theses medications. Specifically, whether or not he should be on them + doses.

## 2022-05-21 NOTE — Telephone Encounter (Signed)
Tried to call patient, and there was no answer and his voicemail was full. Will forward to Dr. Olin Pia nurse.

## 2022-05-22 MED ORDER — LISINOPRIL 10 MG PO TABS: 10.0000 mg | ORAL_TABLET | Freq: Every day | ORAL | 3 refills | Status: DC

## 2022-05-22 NOTE — Telephone Encounter (Signed)
Spoke with pt who is asking for a refill of Lisinopril.  Pt advised this medication is not listed on his MAR and according to discharge summary from 05/04/22 has been advised to discontinue this medication.  Pt states he was started on Lisinopril while in the rehab in [place].  Pt did not show for his EP appointment on 10/24 and has not had a repeat BMET.  He has been rescheduled with EP on 06/11/2022 and HF clinic on 06/10/2022.  Pt reports BP today 128/86.   Pt advised Dr Caryl Comes is out of the office for another week.  RN will have pt placed on cancellation list to be seen sooner.  Will forward to Heart Failure clinic staff to see if they can assist with Lisinopril refill.

## 2022-05-22 NOTE — Telephone Encounter (Signed)
Bensimhon, Shaune Pascal, MD       If Systolic blood pressure > 100 resume lisinopril 10, spiro 25 and rosuvastatin     Per Dr Bensimhon's response to pt in mychart, Lisinopril sent in

## 2022-05-25 DIAGNOSIS — R3912 Poor urinary stream: Secondary | ICD-10-CM | POA: Diagnosis not present

## 2022-05-25 DIAGNOSIS — E291 Testicular hypofunction: Secondary | ICD-10-CM | POA: Diagnosis not present

## 2022-05-25 DIAGNOSIS — N401 Enlarged prostate with lower urinary tract symptoms: Secondary | ICD-10-CM | POA: Diagnosis not present

## 2022-05-25 DIAGNOSIS — N5201 Erectile dysfunction due to arterial insufficiency: Secondary | ICD-10-CM | POA: Diagnosis not present

## 2022-05-26 NOTE — Progress Notes (Signed)
Remote ICD transmission.   

## 2022-05-28 NOTE — Telephone Encounter (Signed)
Patient missed this appointment but is scheduled to see Marinus Maw, PA-C on 05/15/22 here in clinic for f/u.

## 2022-06-04 DIAGNOSIS — J9601 Acute respiratory failure with hypoxia: Secondary | ICD-10-CM | POA: Diagnosis not present

## 2022-06-09 ENCOUNTER — Telehealth (HOSPITAL_COMMUNITY): Payer: Self-pay

## 2022-06-09 NOTE — Progress Notes (Incomplete)
Advanced Heart Failure Clinic Note  Primary Care: [name], NP HF Cardiologist: Dr. Haroldine Laws  HPI: [name] is a 61 y.o. 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 Jude ICD implantation. Was enrolled in Analyze ST.    Has been followed recently by Dr. Stann Mainland at [facility]. Underwent cath at [facility] in 3/15 and had 2.5x37m Xience DES placed in LAD. Had cath 12/15 here with stable CAD. Repeat cath set up in 10/18 but he cancelled.    Echo (8/19): EF 30-35% (stable) mild RV dilation.  Echo 07/18/19 EF 40-45% mild MR   Lost to follow up.  Admitted 10/23 with overdose and NSTEMI.  Had recent 8-week stay at rehab, returned home and found unresponsive. Given narcan in field. Required ICU monitoring and Narcan gtt. Treated for aspiration PNA and AKI. HS Trops elevated, cardiology consulted felt to be type II NSTEMI in setting of drug overdose with no recommended ischemic evaluation. Echo showed EF 25-30%. LHC deferred as reduced EF felt to be related to overdose. General cardiology consulted and GDMT titrated, but limited by AKI. Patient left AMA, weight 235 lbs.  Today he returns for post hospital HF follow up with his wife. Overall feeling fine. He is SOB walking on flat ground on and off. He is working with a trainor 3x/week doing weights with no dyspnea. Occasional dizziness, but no falls. Denies palpitations, CP,  edema, or PND/Orthopnea. Appetite ok. No fever or chills. He does not weigh at home. Taking all medications. Wears 4L oxygen at night, smokes 1/2 ppd, no ETOH. No longer uses Advil regularly. Does outpatient counseling 2x/week and family therapy for substance abuse. No further methadone use since admission.   Cardiac Studies - Echo (10/23): EF 25-30%, grade I DD, RV mildly  reduced  - Echo (1/5/2):1 EF 40-45% mild MR  - Echo (2019): EF 30-35%  - [facility]  - [facility] 2.5x122mXience DES placed in LAD.   Past Medical History:  Diagnosis Date   Anxiety    Automatic implantable cardiac defibrillator St Judes    Analyze ST   Benign prostatic hypertrophy    Benzodiazepine dependence (HCC)    chronic   CAD (coronary artery disease)    Last cath 2/12. 3-v CAD. Failed PCI of distal RCAc  CATH [facility] 4/13 with DES to  LAD X2   CHF (congestive heart failure) (HCC)    Chronic back pain    lumbar stenosis   Chronic systolic heart failure (HCC)    EF 20-25%. s/p ST. Jude ICD   Depression    DJD (degenerative joint disease)    History of testicular cancer    Ischemic cardiomyopathy    [facility]   [place]   [place]ronic   OSA on CPAP    Polycythemia, secondary    S/p hematology consultation for polycythema on 01/06/18.  S/p bone marrow biopsy on 12/31/17 that was negative for myeloproliferative disorder.  Feels secondary to sleep apnea, active smoking, testosterone supplementation, nocturnal hypoxia   Substance abuse (HCC)    TIA (transient ischemic attack)    Vitamin B12 deficiency 07/22/2016    Current Outpatient Medications  Medication Sig Dispense Refill   AFRIN 12 HOUR 0.05 % nasal spray Place 1 spray  into both nostrils 2 (two) times daily as needed for congestion.     aspirin EC 81 MG tablet Take 81 mg by mouth in the morning. Swallow whole.     calcium carbonate (TUMS - DOSED IN MG ELEMENTAL CALCIUM) 500 MG chewable tablet Chew 1-2 tablets by mouth daily as needed for indigestion or heartburn.     clopidogrel (PLAVIX) 75 MG tablet TAKE ONE TABLET BY MOUTH DAILY (Patient taking differently: Take 75 mg by mouth in the morning.) 90 tablet 0   ibuprofen (ADVIL,MOTRIN) 200 MG tablet Take 800 mg by mouth every 6 (six) hours as needed for moderate pain.     lisinopril (ZESTRIL) 10 MG tablet  Take 1 tablet (10 mg total) by mouth daily. 30 tablet 3   naloxone (NARCAN) nasal spray 4 mg/0.1 mL Place 1 spray into the nose once as needed (for opioid emergency). 1 each 2   PEPCID COMPLETE 10-800-165 MG chewable tablet Chew 1 tablet by mouth daily as needed (for reflux).     Testosterone Cypionate 200 MG/ML KIT Inject 200 mg into the muscle every 14 (fourteen) days.     torsemide (DEMADEX) 20 MG tablet TAKE ONE TABLET DAILY AS NEEDED FOR SWELLING IN LEGS. (Patient taking differently: Take 20 mg by mouth daily.) 90 tablet 2   traZODone (DESYREL) 100 MG tablet Take 1 tablet (100 mg total) by mouth at bedtime. (Patient taking differently: Take 200 mg by mouth at bedtime.) 90 tablet 0   tamsulosin (FLOMAX) 0.4 MG CAPS capsule Take 1 capsule (0.4 mg total) by mouth daily after breakfast. (Patient not taking: Reported on 06/10/2022) 90 capsule 0   No current facility-administered medications for this encounter.    Allergies  Allergen Reactions   Darvocet [Propoxyphene N-Acetaminophen] Anaphylaxis    Throat closes   Depakote [Divalproex Sodium] Other (See Comments)    Coded- ended up on a vent   Haldol [Haloperidol] Other (See Comments)    Coded- ended up on a vent   Propoxyphene Anaphylaxis and Swelling      Social History   Socioeconomic History   Marital status: Divorced    Spouse name: Not on file   Number of children: 4   Years of education: Not on file   Highest education level: Not on file  Occupational History   Not on file  Tobacco Use   Smoking status: Every Day    Packs/day: 0.50    Years: 29.00    Total pack years: 14.50    Types: Cigarettes, E-cigarettes    Last attempt to quit: 07/14/2015    Years since quitting: 6.9   Smokeless tobacco: Never   Tobacco comments:    no cigarette in 4 days but yes to e-cig  Vaping Use   Vaping Use: Every day   Start date: 07/14/2015  Substance and Sexual Activity   Alcohol use: No    Alcohol/week: 0.0 standard drinks of alcohol    Drug use: Yes    Types: Marijuana    Comment: Urine showed THC   Sexual activity: Yes    Partners: Male  Other Topics Concern   Not on file  Social History Narrative   Marital status: married x 12 years; wife is severe alcoholic.      Children: 3 married daughters (23, 7, 90); 1 son (21yo); 3 grandchildren born in 2017      Lives: with wife, son      Left-handed   Caffeine: 3 drinks per day   Social  Determinants of Health   Financial Resource Strain: Not on file  Food Insecurity: No Food Insecurity (05/04/2022)   Hunger Vital Sign    Worried About Running Out of Food in the Last Year: Never true    Ran Out of Food in the Last Year: Never true  Transportation Needs: No Transportation Needs (05/04/2022)   PRAPARE - Hydrologist (Medical): No    Lack of Transportation (Non-Medical): No  Physical Activity: Not on file  Stress: Not on file  Social Connections: Not on file  Intimate Partner Violence: Not At Risk (05/04/2022)   Humiliation, Afraid, Rape, and Kick questionnaire    Fear of Current or Ex-Partner: No    Emotionally Abused: No    Physically Abused: No    Sexually Abused: No   Family History  Problem Relation Age of Onset   Heart disease Father 91       Died of MI   Cancer Mother    BP 118/64   Pulse 95   Wt 106.6 kg (235 lb)   SpO2 96%   BMI 31.87 kg/m   Wt Readings from Last 3 Encounters:  06/10/22 106.6 kg (235 lb)  05/04/22 107.7 kg (237 lb 7 oz)  09/17/21 83.9 kg (185 lb)   PHYSICAL EXAM: General:  NAD. No resp difficulty, walked into clinic with rolling walker. HEENT: Normal Neck: Supple. No JVD. Carotids 2+ bilat; no bruits. No lymphadenopathy or thryomegaly appreciated. Cor: PMI nondisplaced. Regular rate & rhythm. No rubs, gallops or murmurs. Lungs: Rhonchi in lower lobes Abdomen: Soft, nontender, nondistended. No hepatosplenomegaly. No bruits or masses. Good bowel sounds. Extremities: No cyanosis, clubbing,  rash, 1+ pedal edema Neuro: Alert & oriented x 3, cranial nerves grossly intact. Moves all 4 extremities w/o difficulty. Affect pleasant.  ECG: patient had to leave before ECG   ASSESSMENT & PLAN: 1. Chronic systolic HF/iCM - Echo (8/09): EF 30-35%.  - S/p STJ ICD - Echo (07/18/19): EF 40-45% - Echo (10/23): EF 25-30%, grade I DD, RV mildly reduced, felt to be newly reduced due to overdose. - Today NYHA II, volume OK today. - Start Farxiga 10 mg daily. - Continue lisinopril 10 mg daily. Consider eventual switch to [facility] if BP allows. - Continue torsemide 20 mg daily for now. - Labs today. - Plan to add back GDMT and repeat echo ~ 3 months. - Defer CR as he is working with a Clinical research associate. - Unable to [employer]. Jude device in clinic today. He has EP follow up 06/11/22.  2. CAD with ischemic CM - Stable by cath 2015. - Recent type II NSTEMI 2/2 overdose. - No chest pain. - Continue DAPT, statin. - Add beta blocker back soon.  3. Chronic respiratory failure - Uses oxygen at night.  4. Tobacco use - Continues to smoke 1/2 ppd. - Discussed cessation, especially with O2 use.  5. Polycythemia  - Followed by Dr. Annabelle Harman at [facility] and felt to be due to chronic hypoxia from OSA.  - Seen by Dr. Alen Blew who agree.  6. Spinal stenosis - This is severe. - Follows with Ortho and Spine  7. H/o drug use w/ recent overdose - No longer using - Continues with outpatient counseling.  8. Recent AKI - Labs today.  9. H/o PNA - Completed augmentin. - Rhonchi on exam today. - Oxygen sat 96% on room air today, no fevers or chills. - Needs follow up with PCP.  Follow up in 3 weeks with  PharmD (add spiro or bisoprolol; he will need an EKG), 6 weeks with APP and 3 months with Dr. Haroldine Laws + echo.  Allena Katz, FNP-BC 06/10/22

## 2022-06-09 NOTE — Telephone Encounter (Signed)
Called and was unable to leave a voice message  to confirm/remind patient of their appointment at the [facility] on 06/10/22.

## 2022-06-10 ENCOUNTER — Encounter (HOSPITAL_COMMUNITY): Payer: Self-pay

## 2022-06-10 ENCOUNTER — Ambulatory Visit (HOSPITAL_COMMUNITY)
Admit: 2022-06-10 | Discharge: 2022-06-10 | Disposition: A | Discharge status: Home / Self Care | Payer: BC Managed Care – PPO | Source: Ambulatory Visit | Attending: Family Medicine | Admitting: Family Medicine

## 2022-06-10 VITALS — BP 118/64 | HR 95 | Wt 235.0 lb

## 2022-06-10 DIAGNOSIS — R42 Dizziness and giddiness: Secondary | ICD-10-CM | POA: Diagnosis not present

## 2022-06-10 DIAGNOSIS — D751 Secondary polycythemia: Secondary | ICD-10-CM | POA: Diagnosis not present

## 2022-06-10 DIAGNOSIS — Z7151 Drug abuse counseling and surveillance of drug abuser: Secondary | ICD-10-CM | POA: Insufficient documentation

## 2022-06-10 DIAGNOSIS — F1911 Other psychoactive substance abuse, in remission: Secondary | ICD-10-CM

## 2022-06-10 DIAGNOSIS — Z8701 Personal history of pneumonia (recurrent): Secondary | ICD-10-CM

## 2022-06-10 DIAGNOSIS — Z720 Tobacco use: Secondary | ICD-10-CM

## 2022-06-10 DIAGNOSIS — J9610 Chronic respiratory failure, unspecified whether with hypoxia or hypercapnia: Secondary | ICD-10-CM | POA: Insufficient documentation

## 2022-06-10 DIAGNOSIS — Z6831 Body mass index (BMI) 31.0-31.9, adult: Secondary | ICD-10-CM | POA: Diagnosis not present

## 2022-06-10 DIAGNOSIS — N179 Acute kidney failure, unspecified: Secondary | ICD-10-CM | POA: Diagnosis not present

## 2022-06-10 DIAGNOSIS — F17210 Nicotine dependence, cigarettes, uncomplicated: Secondary | ICD-10-CM | POA: Diagnosis not present

## 2022-06-10 DIAGNOSIS — M4800 Spinal stenosis, site unspecified: Secondary | ICD-10-CM | POA: Insufficient documentation

## 2022-06-10 DIAGNOSIS — F32A Depression, unspecified: Secondary | ICD-10-CM | POA: Diagnosis not present

## 2022-06-10 DIAGNOSIS — Z79899 Other long term (current) drug therapy: Secondary | ICD-10-CM | POA: Insufficient documentation

## 2022-06-10 DIAGNOSIS — G8929 Other chronic pain: Secondary | ICD-10-CM | POA: Insufficient documentation

## 2022-06-10 DIAGNOSIS — I255 Ischemic cardiomyopathy: Secondary | ICD-10-CM | POA: Insufficient documentation

## 2022-06-10 DIAGNOSIS — I2510 Atherosclerotic heart disease of native coronary artery without angina pectoris: Secondary | ICD-10-CM | POA: Diagnosis not present

## 2022-06-10 DIAGNOSIS — I252 Old myocardial infarction: Secondary | ICD-10-CM | POA: Diagnosis not present

## 2022-06-10 DIAGNOSIS — Z716 Tobacco abuse counseling: Secondary | ICD-10-CM | POA: Insufficient documentation

## 2022-06-10 DIAGNOSIS — Z9981 Dependence on supplemental oxygen: Secondary | ICD-10-CM | POA: Diagnosis not present

## 2022-06-10 DIAGNOSIS — G4733 Obstructive sleep apnea (adult) (pediatric): Secondary | ICD-10-CM | POA: Insufficient documentation

## 2022-06-10 DIAGNOSIS — I5022 Chronic systolic (congestive) heart failure: Secondary | ICD-10-CM | POA: Insufficient documentation

## 2022-06-10 LAB — CBC
HCT: 41.2 % (ref 39.0–52.0)
Hemoglobin: 13.3 g/dL (ref 13.0–17.0)
MCH: 29.6 pg (ref 26.0–34.0)
MCHC: 32.3 g/dL (ref 30.0–36.0)
MCV: 91.8 fL (ref 80.0–100.0)
Platelets: 176 10*3/uL (ref 150–400)
RBC: 4.49 MIL/uL (ref 4.22–5.81)
RDW: 14.9 % (ref 11.5–15.5)
WBC: 8 10*3/uL (ref 4.0–10.5)
nRBC: 0 % (ref 0.0–0.2)

## 2022-06-10 LAB — BRAIN NATRIURETIC PEPTIDE: B Natriuretic Peptide: 143 pg/mL — ABNORMAL HIGH (ref 0.0–100.0)

## 2022-06-10 LAB — BASIC METABOLIC PANEL
Anion gap: 9 (ref 5–15)
BUN: 7 mg/dL — ABNORMAL LOW (ref 8–23)
CO2: 29 mmol/L (ref 22–32)
Calcium: 8.8 mg/dL — ABNORMAL LOW (ref 8.9–10.3)
Chloride: 99 mmol/L (ref 98–111)
Creatinine, Ser: 1.2 mg/dL (ref 0.61–1.24)
GFR, Estimated: >60 mL/min (ref >60)
Glucose, Bld: 100 mg/dL — ABNORMAL HIGH (ref 70–99)
Potassium: 3.1 mmol/L — ABNORMAL LOW (ref 3.5–5.1)
Sodium: 137 mmol/L (ref 135–145)

## 2022-06-10 MED ORDER — DAPAGLIFLOZIN PROPANEDIOL 10 MG PO TABS: 10.0000 mg | ORAL_TABLET | Freq: Every day | ORAL | 8 refills | Status: DC

## 2022-06-10 NOTE — Progress Notes (Unsigned)
Cardiology Office Note Date:  06/10/2022  Patient ID:  Maury, Groninger Apr 07, 1961, MRN [id number] PCP:  No primary care provider on file.  Cardiologist:  Dr. Haroldine Laws Electrophysiologist: Dr. Caryl Comes  ***refresh   Chief Complaint: *** ??  History of Present Illness: JESSEJAMES STEELMAN is a 61 y.o. male with history of MAI lung infection (dx in 2012), polycythemia vera (felt 2/2 chronic hypoxia from OSA), CAD (PCI 2015 RCA), ICM, chronic CHF (systolic), spinal stenosis with CBP  Admitted 02/14/21 with AMS and reports of ICD shocks.  He was hypotensive, labs without explanation, ETOH level <10, pt reported having taken something called a "Delta 10" , given naloxone and IVF with improvement > ICU on Narcan gtt.  Though ultimately left AMA ICD was ERI only a day, advised he needed gen change within 3 mo time  He saw Dr. Caryl Comes 07/21/21. Noted + hx of appropriate tx w/ATP for VT.  Pt had self stopped his ACE, discussed importance of medical therepy, there was some reluctance in gen change procedure by Dr. Caryl Comes, though ultimately decided to pursue. NO EP f/u since his gen change  Hospitalized 05/04/22, found unresponsive at home, treated with Narcan in the field (note he had just completed 8-weeks of rehab) with improved mental status Placed on narcan gtt in ICU had another episode of lethargy. Pt had reported at home taking methadone, but not sure how much, mentioned he was in a lot of pain. Treated for possible aspiration pneumonia HS Trops elevated, cardiology consulted felt to be type II NSTEMI in setting of drug overdose with no recommended ischemic evaluation. Did require O2 for home Discharged 10/23  Pt has called a couple times with questions regarding his medicines. Missed his EP follow up 10/24  *** had HF appt 11/29,, *** show?  *** volume *** meds, CAD, CM *** Device *** needs a BMET, AKI last ACE held   Device information Abbott dual chamber ICD implanted 08/13/2010 , gen  change 08/11/21   Past Medical History:  Diagnosis Date   Anxiety    Automatic implantable cardiac defibrillator St Judes    Analyze ST   Benign prostatic hypertrophy    Benzodiazepine dependence (HCC)    chronic   CAD (coronary artery disease)    Last cath 2/12. 3-v CAD. Failed PCI of distal RCAc  CATH [facility] 4/13 with DES to  LAD X2   CHF (congestive heart failure) (HCC)    Chronic back pain    lumbar stenosis   Chronic systolic heart failure (HCC)    EF 20-25%. s/p ST. Jude ICD   Depression    DJD (degenerative joint disease)    History of testicular cancer    Ischemic cardiomyopathy    Myocardial infarction [facility])    Narcotic dependence ([place])    chronic   OSA on CPAP    Polycythemia, secondary    S/p hematology consultation for polycythema on 01/06/18.  S/p bone marrow biopsy on 12/31/17 that was negative for myeloproliferative disorder.  Feels secondary to sleep apnea, active smoking, testosterone supplementation, nocturnal hypoxia   Substance abuse ([place])    TIA (transient ischemic attack)    Vitamin B12 deficiency 07/22/2016    Past Surgical History:  Procedure Laterality Date   ASD REPAIR, SINUS VENOSUS     CARDIAC DEFIBRILLATOR PLACEMENT  08/2010   HERNIA REPAIR     ICD GENERATOR CHANGEOUT N/A 08/11/2021   Procedure: ICD GENERATOR CHANGEOUT;  Surgeon: Deboraha Sprang, MD;  Location: [place] CV LAB;  Service: Cardiovascular;  Laterality: N/A;   LEFT HEART CATHETERIZATION WITH CORONARY ANGIOGRAM N/A 06/14/2014   Procedure: LEFT HEART CATHETERIZATION WITH CORONARY ANGIOGRAM;  Surgeon: Jolaine Artist, MD;  Location: [facility] CATH LAB;  Service: Cardiovascular;  Laterality: N/A;   TESTICLE SURGERY     testicular cancer surgery left testicle removed    Current Outpatient Medications  Medication Sig Dispense Refill   AFRIN 12 HOUR 0.05 % nasal spray Place 1 spray into both nostrils 2 (two) times daily as needed for congestion.     amoxicillin-clavulanate (AUGMENTIN) 875-125  MG tablet Take 1 tablet by mouth 2 (two) times daily. 8 tablet 0   aspirin EC 81 MG tablet Take 81 mg by mouth in the morning. Swallow whole.     atorvastatin (LIPITOR) 20 MG tablet Take 20 mg by mouth daily.     calcium carbonate (TUMS - DOSED IN MG ELEMENTAL CALCIUM) 500 MG chewable tablet Chew 1-2 tablets by mouth daily as needed for indigestion or heartburn.     clopidogrel (PLAVIX) 75 MG tablet TAKE ONE TABLET BY MOUTH DAILY (Patient taking differently: Take 75 mg by mouth in the morning.) 90 tablet 0   dapagliflozin propanediol (FARXIGA) 10 MG TABS tablet Discuss with Cardiology providers before resuming this medication. 30 tablet 6   furosemide (LASIX) 20 MG tablet Take 1 tablet (20 mg total) by mouth daily. (Patient taking differently: Take 20 mg by mouth daily as needed for edema or fluid.) 30 tablet 3   gabapentin (NEURONTIN) 100 MG capsule Take 300 mg by mouth 3 (three) times daily.     ibuprofen (ADVIL,MOTRIN) 200 MG tablet Take 800 mg by mouth every 6 (six) hours as needed for moderate pain.     lisinopril (ZESTRIL) 10 MG tablet Take 1 tablet (10 mg total) by mouth daily. 30 tablet 3   Magnesium 250 MG TABS Take 250 mg by mouth daily.     metoprolol succinate (TOPROL-XL) 25 MG 24 hr tablet Take 25 mg by mouth daily.     naloxone (NARCAN) nasal spray 4 mg/0.1 mL Place 1 spray into the nose once as needed (for opioid emergency). 1 each 2   PEPCID COMPLETE 10-800-165 MG chewable tablet Chew 1 tablet by mouth daily as needed (for reflux).     tamsulosin (FLOMAX) 0.4 MG CAPS capsule Take 1 capsule (0.4 mg total) by mouth daily after breakfast. (Patient taking differently: Take 0.4 mg by mouth every evening.) 90 capsule 0   Testosterone Cypionate 200 MG/ML KIT Inject 200 mg into the muscle every 14 (fourteen) days.     torsemide (DEMADEX) 20 MG tablet TAKE ONE TABLET DAILY AS NEEDED FOR SWELLING IN LEGS. (Patient taking differently: Take 20 mg by mouth daily.) 90 tablet 2   traZODone  (DESYREL) 100 MG tablet Take 1 tablet (100 mg total) by mouth at bedtime. (Patient taking differently: Take 200 mg by mouth at bedtime.) 90 tablet 0   No current facility-administered medications for this visit.    Allergies:   Darvocet [propoxyphene n-acetaminophen], Depakote [divalproex sodium], Haldol [haloperidol], and Propoxyphene   Social History:  The patient  reports that he has been smoking cigarettes and e-cigarettes. He has a 14.50 pack-year smoking history. He has never used smokeless tobacco. He reports current drug use. Drug: Marijuana. He reports that he does not drink alcohol.   Family History:  The patient's family history includes Cancer in his mother; Heart disease (age of onset: 62) in his  father.  ROS:  Please see the history of present illness.    All other systems are reviewed and otherwise negative.   PHYSICAL EXAM:  VS:  There were no vitals taken for this visit. BMI: There is no height or weight on file to calculate BMI. Well nourished, well developed, in no acute distress HEENT: normocephalic, atraumatic Neck: no JVD, carotid bruits or masses Cardiac:  *** RRR; no significant murmurs, no rubs, or gallops Lungs:  *** CTA b/l, no wheezing, rhonchi or rales Abd: soft, nontender MS: no deformity or *** atrophy Ext: *** no edema Skin: warm and dry, no rash Neuro:  No gross deficits appreciated Psych: euthymic mood, full affect  *** ICD site is stable, no tethering or discomfort   EKG:  not done today  Device interrogation done today and reviewed by myself:  ***  Echo 05/04/2022 IMPRESSIONS  1. Left ventricular ejection fraction, by estimation, is 25 to 30%. The  left ventricle has severely decreased function. The left ventricle  demonstrates global hypokinesis. The left ventricular internal cavity size  was mildly dilated. Left ventricular  diastolic parameters are consistent with Grade I diastolic dysfunction  (impaired relaxation).   2. Right  ventricular systolic function is mildly reduced. The right  ventricular size is normal. There is mildly elevated pulmonary artery  systolic pressure.   3. Left atrial size was moderately dilated.   4. Right atrial size was mildly dilated.   5. The mitral valve is normal in structure. No evidence of mitral valve  regurgitation. No evidence of mitral stenosis.   6. The aortic valve is tricuspid. Aortic valve regurgitation is not  visualized. No aortic stenosis is present.   7. The inferior vena cava is dilated in size with <50% respiratory  variability, suggesting right atrial pressure of 15 mmHg.   8. Technically difficult study with poor acoustic windows  Recent Labs: 05/03/2022: ALT 45; B Natriuretic Peptide 411.9 05/04/2022: BUN 27; Creatinine, Ser 2.06; Hemoglobin 12.0; Magnesium 2.4; Platelets 162; Potassium 4.1; Sodium 138  09/02/2021: Cholesterol 142; HDL 39.60; LDL Cholesterol 73; Total CHOL/HDL Ratio 4; Triglycerides 146.0; VLDL 29.2   CrCl cannot be calculated (Patient's most recent lab result is older than the maximum 21 days allowed.).   Wt Readings from Last 3 Encounters:  05/04/22 237 lb 7 oz (107.7 kg)  09/17/21 185 lb (83.9 kg)  09/10/21 184 lb (83.5 kg)     Other studies reviewed: Additional studies/records reviewed today include: summarized above  ASSESSMENT AND PLAN:  ICD ***  CAD ***  ICM Chronic CHF ***  Disposition: F/u with ***  Current medicines are reviewed at length with the patient today.  The patient did not have any concerns regarding medicines.  Venetia Night, PA-C 06/10/2022 12:43 PM     [address] [place] [address] [phone] (office)  [phone] (fax)

## 2022-06-10 NOTE — Patient Instructions (Addendum)
Thank you for coming in today  Labs were done today, if any labs are abnormal the clinic will call you No news is good news  RESTART Farxiga 10 mg 1 tablet daily   Your physician recommends that you schedule a follow-up appointment in:  3 weeks in Pharmacy 6 weeks in clinic  12 weeks with Dr. Haroldine Laws with echocardiogram   Your physician has requested that you have an echocardiogram. Echocardiography is a painless test that uses sound waves to create images of your heart. It provides your doctor with information about the size and shape of your heart and how well your heart's chambers and valves are working. This procedure takes approximately one hour. There are no restrictions for this procedure.    PLEASE MAKE APPOINTMENT WITH YOUR PRIMARY CARE PHYSICIAN   Do the following things EVERYDAY: Weigh yourself in the morning before breakfast. Write it down and keep it in a log. Take your medicines as prescribed Eat low salt foods--Limit salt (sodium) to 2000 mg per day.  Stay as active as you can everyday Limit all fluids for the day to less than 2 liters  At the [facility], you and your health needs are our priority. As part of our continuing mission to provide you with exceptional heart care, we have created designated Provider Care Teams. These Care Teams include your primary Cardiologist (physician) and Advanced Practice Providers (APPs- Physician Assistants and Nurse Practitioners) who all work together to provide you with the care you need, when you need it.   You may see any of the following providers on your designated Care Team at your next follow up: Dr Glori Bickers Dr Loralie Champagne Dr. Roxana Hires, NP Lyda Jester, [place] [facility] [place], [place] Forestine Na, NP Audry Riles, PharmD   Please be sure to bring in all your medications bottles to every appointment.   If you have any questions or concerns before your next  appointment please send [place] a message through [place] or call our office at [phone].    TO LEAVE A MESSAGE FOR THE NURSE SELECT OPTION 2, PLEASE LEAVE A MESSAGE INCLUDING: YOUR NAME DATE OF BIRTH CALL BACK NUMBER REASON FOR CALL**this is important as we prioritize the call backs  YOU WILL RECEIVE A CALL BACK THE SAME DAY AS LONG AS YOU CALL BEFORE 4:00 PM

## 2022-06-10 NOTE — Addendum Note (Signed)
Encounter addended by: Rafael Bihari, FNP on: 06/10/2022 5:23 PM  Actions taken: Clinical Note Signed

## 2022-06-11 ENCOUNTER — Ambulatory Visit: Payer: BC Managed Care – PPO | Attending: Physician Assistant | Admitting: Physician Assistant

## 2022-06-11 ENCOUNTER — Telehealth (HOSPITAL_COMMUNITY): Payer: Self-pay

## 2022-06-11 ENCOUNTER — Encounter: Payer: Self-pay | Admitting: Physician Assistant

## 2022-06-11 VITALS — BP 128/82 | HR 88 | Ht 72.0 in | Wt 234.4 lb

## 2022-06-11 DIAGNOSIS — Z95810 Presence of automatic (implantable) cardiac defibrillator: Secondary | ICD-10-CM | POA: Diagnosis not present

## 2022-06-11 DIAGNOSIS — I472 Ventricular tachycardia, unspecified: Secondary | ICD-10-CM

## 2022-06-11 DIAGNOSIS — I5022 Chronic systolic (congestive) heart failure: Secondary | ICD-10-CM | POA: Diagnosis not present

## 2022-06-11 DIAGNOSIS — I480 Paroxysmal atrial fibrillation: Secondary | ICD-10-CM

## 2022-06-11 DIAGNOSIS — I255 Ischemic cardiomyopathy: Secondary | ICD-10-CM

## 2022-06-11 LAB — CUP PACEART INCLINIC DEVICE CHECK
Battery Remaining Longevity: 102 mo
Brady Statistic RA Percent Paced: 0.05 %
Brady Statistic RV Percent Paced: 0 %
Date Time Interrogation Session: 20231130165128
HighPow Impedance: 63 Ohm
Implantable Lead Connection Status: 753985
Implantable Lead Connection Status: 753985
Implantable Lead Implant Date: 20120201
Implantable Lead Implant Date: 20120201
Implantable Lead Location: 753859
Implantable Lead Location: 753860
Implantable Pulse Generator Implant Date: 20230131
Lead Channel Impedance Value: 400 Ohm
Lead Channel Impedance Value: 400 Ohm
Lead Channel Pacing Threshold Amplitude: 0.75 V
Lead Channel Pacing Threshold Amplitude: 0.75 V
Lead Channel Pacing Threshold Amplitude: 1.75 V
Lead Channel Pacing Threshold Amplitude: 1.75 V
Lead Channel Pacing Threshold Pulse Width: 0.5 ms
Lead Channel Pacing Threshold Pulse Width: 0.5 ms
Lead Channel Pacing Threshold Pulse Width: 0.7 ms
Lead Channel Pacing Threshold Pulse Width: 0.7 ms
Lead Channel Sensing Intrinsic Amplitude: 11.9 mV
Lead Channel Sensing Intrinsic Amplitude: 4.1 mV
Lead Channel Setting Pacing Amplitude: 2 V
Lead Channel Setting Pacing Amplitude: 3.5 V
Lead Channel Setting Pacing Pulse Width: 0.7 ms
Lead Channel Setting Sensing Sensitivity: 0.5 mV
Pulse Gen Serial Number: 111050201
Zone Setting Status: 755011

## 2022-06-11 MED ORDER — LISINOPRIL 10 MG PO TABS: 10.0000 mg | ORAL_TABLET | Freq: Every day | ORAL | 3 refills | Status: DC

## 2022-06-11 MED ORDER — CLOPIDOGREL BISULFATE 75 MG PO TABS: ORAL_TABLET | ORAL | 3 refills | Status: DC

## 2022-06-11 MED ORDER — TORSEMIDE 20 MG PO TABS: ORAL_TABLET | ORAL | 0 refills | Status: DC

## 2022-06-11 MED ORDER — POTASSIUM CHLORIDE CRYS ER 20 MEQ PO TBCR: 20.0000 meq | EXTENDED_RELEASE_TABLET | Freq: Every day | ORAL | 3 refills | Status: DC

## 2022-06-11 MED ORDER — POTASSIUM CHLORIDE CRYS ER 20 MEQ PO TBCR: 20.0000 meq | EXTENDED_RELEASE_TABLET | Freq: Every day | ORAL | 1 refills | Status: DC

## 2022-06-11 NOTE — Telephone Encounter (Addendum)
Pt aware, agreeable, and verbalized understanding    ----- Message from Rafael Bihari, FNP sent at 06/10/2022  5:48 PM EST ----- K is low.  Take 40 KCL x 1 today, then tomorrow start 20 KCL daily.  Repeat BMET in 7-10 days

## 2022-06-11 NOTE — Patient Instructions (Signed)
Medication Instructions:   Your physician recommends that you continue on your current medications as directed. Please refer to the Current Medication list given to you today.  *If you need a refill on your cardiac medications before your next appointment, please call your pharmacy*   Lab Work:  [place]   If you have labs (blood work) drawn today and your tests are completely normal, you will receive your results only by: [place] (if you have MyChart) OR A paper copy in the mail If you have any lab test that is abnormal or we need to change your treatment, we will call you to review the results.   Testing/Procedures: NONE ORDERED  TODAY    Follow-Up: At [facility], you and your health needs are our priority.  As part of our continuing mission to provide you with exceptional heart care, we have created designated Provider Care Teams.  These Care Teams include your primary Cardiologist (physician) and Advanced Practice Providers (APPs -  Physician Assistants and Nurse Practitioners) who all work together to provide you with the care you need, when you need it.  We recommend signing up for the patient portal called "MyChart".  Sign up information is provided on this After Visit Summary.  MyChart is used to connect with patients for Virtual Visits (Telemedicine).  Patients are able to view lab/test results, encounter notes, upcoming appointments, etc.  Non-urgent messages can be sent to your provider as well.   To learn more about what you can do with MyChart, go to [email or web address].    Your next appointment:   1 year(s)  The format for your next appointment:   In Person  Provider:   Virl Axe, MD or Tommye Standard, PA-C   Other Instructions   Important Information About Sugar

## 2022-06-15 DIAGNOSIS — F411 Generalized anxiety disorder: Secondary | ICD-10-CM | POA: Diagnosis not present

## 2022-06-19 DIAGNOSIS — F411 Generalized anxiety disorder: Secondary | ICD-10-CM | POA: Diagnosis not present

## 2022-06-22 ENCOUNTER — Other Ambulatory Visit (HOSPITAL_COMMUNITY): Payer: BC Managed Care – PPO

## 2022-06-22 DIAGNOSIS — F411 Generalized anxiety disorder: Secondary | ICD-10-CM | POA: Diagnosis not present

## 2022-06-23 ENCOUNTER — Other Ambulatory Visit (HOSPITAL_COMMUNITY): Payer: BC Managed Care – PPO

## 2022-06-23 NOTE — Progress Notes (Signed)
Advanced Heart Failure Clinic Note   Primary Care: Trevor Coss, NP HF Cardiologist: Dr. Haroldine Laws  HPI:  [name] is a 61 y.o. 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 Jude ICD implantation. Was enrolled in [employer]ze ST.    Has been followed by Dr. Stann Mainland at [facility]. Underwent cath at [facility] in 09/2013 and had 2.5x43m Xience DES placed in LAD. Had cath 06/2014 here with stable CAD. Repeat cath set up in 04/2017 but he cancelled.    Echo (02/2018): EF 30-35% (stable) mild RV dilation.  Echo 07/18/19 EF 40-45% mild MR   Lost to follow up.   Admitted 04/2022 with overdose and NSTEMI.  Had recent 8-week stay at rehab, returned home and found unresponsive. Given narcan in field. Required ICU monitoring and Narcan gtt. Treated for aspiration PNA and AKI. HS Trops elevated, cardiology consulted felt to be type II NSTEMI in setting of drug overdose with no recommended ischemic evaluation. Echo showed EF 25-30%. LHC deferred as reduced EF felt to be related to overdose. General cardiology consulted and GDMT titrated, but limited by AKI. Patient left AMA, weight 235 lbs.   [facility] wife [facility]/10/22. Overall was feeling fine. He was SOB walking on flat ground on and off. He was working with a tClinical research associate3x/week doing weights with no dyspnea. Occasional dizziness, but no falls. Denied palpitations, CP,  edema, or PND/Orthopnea. Appetite was ok. No fever or chills. He reported that he does not weigh at home. Reported taking all medications. Wears 4L oxygen at night, smokes 1/2 ppd, no ETOH. No longer uses Advil regularly. Does outpatient counseling 2x/week and family therapy for substance abuse. No further methadone use since admission.   Today  he returns to HF clinic for pharmacist medication titration. At last visit with APP, Farxiga 10 mg daily was initiated. Additionally, KCL 20 mEq daily was initiated for K of 3.1 on labs. Overall he is doing ok today. During medication reconciliation, two errors were identified. Patient has been taking KCL 40 mEq daily since 06/11/22 because he didn't realize that he was only supposed to take 40 mEq for 1 day, then decrease to 20 mEq daily. Additionally, he has been taking torsemide 20 mg daily every morning and taking an old Lasix prescription 20 mg in the evening 3-4 times per week for swelling in his legs. Main complaint today is increased weakness and decreased exercise capacity since recent hospitalization. States he used to be able to walk ~80 yards before needing to rest, now he can only walk 25 yards before needing to rest on his walker. He additionally complains of numbness in his arms which is painful. No palpitations. States he has used PRN NTG twice since last visit. The first was when he "overdid it" during an exercise session doing crunches with a trainer. The second episode was when he was "very stressed following an argument". Both episodes of CP resolved immediately once he used the SL NTG. States he still has wheezing and the congestion that was present last visit has not gone away - he plans to follow up with pulmonary. He does not weigh himself daily but weight is down 4 lbs from last visit. Trace bilateral LEE, which is normal for him.  No PND/orthopnea.   HF Medications: Lisinopril 10 mg daily Farxiga 10 mg daily Torsemide 20 mg daily PRN KCL 20 mEq daily  Has the patient been experiencing any side effects to the medications prescribed?  no  Does the patient have any problems obtaining medications due to transportation or finances?   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 Patient understands to avoid decongestants.    Pertinent Lab Values: 06/30/22: Serum creatinine 1.40, BUN 15, Potassium 4.4, Sodium 137  Vital Signs: Weight: 230.2 lbs (last clinic weight: 234.4 lbs) Blood pressure: 110/66  Heart rate: 97   Assessment/Plan: 1. Chronic systolic HF/iCM - Echo (08/2447): EF 30-35%.  - S/p STJ ICD - Echo (07/18/19): EF 40-45% - Echo (04/2022): EF 25-30%, grade I DD, RV mildly reduced, felt to be newly reduced due to overdose. - NYHA II-III, volume status stable on exam - Continue torsemide 20 mg daily. He will stop taking additional Lasix (using from old prescription) at night. He can use additional torsemide 10 mg in the evening as needed for fluid retention. He will decrease KCL to 20 mEq daily, he was accidentally taking 40 mEq daily as he misunderstood instructions. Repeat BMET in 2 weeks.  - Continue lisinopril 10 mg daily. Consider eventual switch to [facility] if BP allows. - Continue Farxiga 10 mg daily. - Start spironolactone 25 mg daily. Repeat BMET in 2 weeks.  - Plan to add back GDMT and repeat echo ~ 3 months. - Defer CR as he is working with a Clinical research associate.   2. CAD with ischemic CM - Stable by cath 2015. - Recent type II NSTEMI 2/2 overdose. - Continue DAPT, statin.   3. Chronic respiratory failure - Uses oxygen at night.   4. Tobacco use - Continues to smoke 1/2 ppd. - Discussed cessation, especially with O2 use.   5. Polycythemia  - Followed by Dr. Annabelle Harman at [facility] and felt to be due to chronic hypoxia from OSA.  - Seen by Dr. Alen Blew who agrees.   6. Spinal stenosis - This is severe. - Follows with Ortho and Spine   7. H/o drug use w/ recent overdose - No longer using - Continues with outpatient counseling.   8. H/o PNA  - Completed augmentin. - Oxygen sat 96% on room air today, no fevers or chills. - Still wheezing and complains of continued congestion - Follow up with PCP or pulmonary.   Follow up 2 weeks  with [place], PharmD, BCPS, BCCP, CPP Heart Failure Clinic Pharmacist [phone]

## 2022-06-24 ENCOUNTER — Other Ambulatory Visit (HOSPITAL_COMMUNITY): Payer: BC Managed Care – PPO

## 2022-06-26 ENCOUNTER — Other Ambulatory Visit (HOSPITAL_COMMUNITY): Payer: BC Managed Care – PPO

## 2022-06-26 ENCOUNTER — Telehealth (HOSPITAL_COMMUNITY): Payer: Self-pay | Admitting: Cardiology

## 2022-06-29 DIAGNOSIS — F411 Generalized anxiety disorder: Secondary | ICD-10-CM | POA: Diagnosis not present

## 2022-06-30 ENCOUNTER — Ambulatory Visit (HOSPITAL_COMMUNITY)
Admission: RE | Admit: 2022-06-30 | Discharge: 2022-06-30 | Disposition: A | Discharge status: Home / Self Care | Payer: BC Managed Care – PPO | Source: Ambulatory Visit | Attending: Cardiology | Admitting: Cardiology

## 2022-06-30 DIAGNOSIS — I5022 Chronic systolic (congestive) heart failure: Secondary | ICD-10-CM | POA: Diagnosis not present

## 2022-06-30 LAB — BASIC METABOLIC PANEL
Anion gap: 7 (ref 5–15)
BUN: 15 mg/dL (ref 8–23)
CO2: 29 mmol/L (ref 22–32)
Calcium: 9.4 mg/dL (ref 8.9–10.3)
Chloride: 101 mmol/L (ref 98–111)
Creatinine, Ser: 1.4 mg/dL — ABNORMAL HIGH (ref 0.61–1.24)
GFR, Estimated: 57 mL/min — ABNORMAL LOW (ref >60)
Glucose, Bld: 109 mg/dL — ABNORMAL HIGH (ref 70–99)
Potassium: 4.4 mmol/L (ref 3.5–5.1)
Sodium: 137 mmol/L (ref 135–145)

## 2022-07-01 DIAGNOSIS — F4322 Adjustment disorder with anxiety: Secondary | ICD-10-CM | POA: Diagnosis not present

## 2022-07-04 DIAGNOSIS — J9601 Acute respiratory failure with hypoxia: Secondary | ICD-10-CM | POA: Diagnosis not present

## 2022-07-07 ENCOUNTER — Ambulatory Visit (HOSPITAL_COMMUNITY)
Admission: RE | Admit: 2022-07-07 | Discharge: 2022-07-07 | Disposition: A | Discharge status: Home / Self Care | Payer: BC Managed Care – PPO | Source: Ambulatory Visit | Attending: Internal Medicine | Admitting: Internal Medicine

## 2022-07-07 VITALS — BP 110/66 | HR 97 | Wt 230.2 lb

## 2022-07-07 DIAGNOSIS — Z95810 Presence of automatic (implantable) cardiac defibrillator: Secondary | ICD-10-CM | POA: Insufficient documentation

## 2022-07-07 DIAGNOSIS — G4733 Obstructive sleep apnea (adult) (pediatric): Secondary | ICD-10-CM | POA: Diagnosis not present

## 2022-07-07 DIAGNOSIS — I5022 Chronic systolic (congestive) heart failure: Secondary | ICD-10-CM | POA: Diagnosis not present

## 2022-07-07 DIAGNOSIS — I2510 Atherosclerotic heart disease of native coronary artery without angina pectoris: Secondary | ICD-10-CM | POA: Diagnosis not present

## 2022-07-07 DIAGNOSIS — J189 Pneumonia, unspecified organism: Secondary | ICD-10-CM | POA: Diagnosis not present

## 2022-07-07 DIAGNOSIS — I255 Ischemic cardiomyopathy: Secondary | ICD-10-CM | POA: Insufficient documentation

## 2022-07-07 DIAGNOSIS — E669 Obesity, unspecified: Secondary | ICD-10-CM | POA: Diagnosis not present

## 2022-07-07 DIAGNOSIS — F32A Depression, unspecified: Secondary | ICD-10-CM | POA: Insufficient documentation

## 2022-07-07 MED ORDER — TORSEMIDE 20 MG PO TABS: 20.0000 mg | ORAL_TABLET | Freq: Every day | ORAL | 3 refills | Status: DC

## 2022-07-07 MED ORDER — SPIRONOLACTONE 25 MG PO TABS: 25.0000 mg | ORAL_TABLET | Freq: Every day | ORAL | 3 refills | Status: DC

## 2022-07-07 NOTE — Patient Instructions (Addendum)
It was a pleasure seeing you today!  MEDICATIONS: -We are changing your medications today -Start spironolactone 25 mg (1 tablet) daily -Decrease Potassium supplement to 20 mEq (1 tablet) daily -Stop taking Lasix at night. Take torsemide 20 mg (1 tablet) every morning. May take an extra 10 mg (1/2 tablet) every evening as needed for swelling/fluid retention.  -Call if you have questions about your medications.   NEXT APPOINTMENT: Return to clinic in 2 weeks with [facility].  In general, to take care of your heart failure: -Limit your fluid intake to 2 Liters (half-gallon) per day.   -Limit your salt intake to ideally 2-3 grams (2000-3000 mg) per day. -Weigh yourself daily and record, and bring that "weight diary" to your next appointment.  (Weight gain of 2-3 pounds in 1 day typically means fluid weight.) -The medications for your heart are to help your heart and help you live longer.   -Please contact us before stopping any of your heart medications.  Call the clinic at [phone] with questions or to reschedule future appointments.

## 2022-07-15 ENCOUNTER — Telehealth (HOSPITAL_COMMUNITY): Payer: Self-pay

## 2022-07-15 NOTE — Telephone Encounter (Signed)
Patient called requesting that we call in something for COVID. I advised him of the heart safe over the counter medications he can take. Patient stated that he wanted an abx. I advised him that he would need to get that from his pcp. He states he has not seen his new pcp yet and has an appointment with them on 1/11. I advised him that he could call their triage line. He asked if lauren could him in something I advised him that he could contact urgent care, patient said no because he doesn't know how to advocate for himself and hung up. Sending as a FYI.

## 2022-07-16 DIAGNOSIS — F411 Generalized anxiety disorder: Secondary | ICD-10-CM | POA: Diagnosis not present

## 2022-07-18 DIAGNOSIS — F411 Generalized anxiety disorder: Secondary | ICD-10-CM | POA: Diagnosis not present

## 2022-07-22 ENCOUNTER — Encounter (HOSPITAL_COMMUNITY): Payer: BC Managed Care – PPO

## 2022-07-22 NOTE — Telephone Encounter (Signed)
No show letter #1

## 2022-07-24 DIAGNOSIS — E1151 Type 2 diabetes mellitus with diabetic peripheral angiopathy without gangrene: Secondary | ICD-10-CM | POA: Diagnosis not present

## 2022-07-24 DIAGNOSIS — I130 Hypertensive heart and chronic kidney disease with heart failure and stage 1 through stage 4 chronic kidney disease, or unspecified chronic kidney disease: Secondary | ICD-10-CM | POA: Diagnosis not present

## 2022-07-27 DIAGNOSIS — F411 Generalized anxiety disorder: Secondary | ICD-10-CM | POA: Diagnosis not present

## 2022-07-29 DIAGNOSIS — E1151 Type 2 diabetes mellitus with diabetic peripheral angiopathy without gangrene: Secondary | ICD-10-CM | POA: Diagnosis not present

## 2022-08-04 DIAGNOSIS — J9601 Acute respiratory failure with hypoxia: Secondary | ICD-10-CM | POA: Diagnosis not present

## 2022-08-04 DIAGNOSIS — F411 Generalized anxiety disorder: Secondary | ICD-10-CM | POA: Diagnosis not present

## 2022-08-06 ENCOUNTER — Telehealth (HOSPITAL_COMMUNITY): Payer: Self-pay

## 2022-08-06 NOTE — Telephone Encounter (Signed)
[name] called requesting antibiotics for his ongoing cough and congestion. He previously tested positive for COVID.  I attempted to reach patient, but no answer and voice mail is full.

## 2022-08-07 DIAGNOSIS — F411 Generalized anxiety disorder: Secondary | ICD-10-CM | POA: Diagnosis not present

## 2022-08-11 DIAGNOSIS — F411 Generalized anxiety disorder: Secondary | ICD-10-CM | POA: Diagnosis not present

## 2022-08-12 ENCOUNTER — Ambulatory Visit (HOSPITAL_BASED_OUTPATIENT_CLINIC_OR_DEPARTMENT_OTHER): Payer: BC Managed Care – PPO | Admitting: Family Medicine

## 2022-08-12 ENCOUNTER — Ambulatory Visit: Payer: BC Managed Care – PPO

## 2022-08-12 DIAGNOSIS — I255 Ischemic cardiomyopathy: Secondary | ICD-10-CM | POA: Diagnosis not present

## 2022-08-12 LAB — CUP PACEART REMOTE DEVICE CHECK
Battery Remaining Longevity: 91 mo
Battery Remaining Percentage: 89 %
Battery Voltage: 3.01 V
Brady Statistic AP VP Percent: 0 %
Brady Statistic AP VS Percent: 1 %
Brady Statistic AS VP Percent: 1 %
Brady Statistic AS VS Percent: 99 %
Brady Statistic RA Percent Paced: 1 %
Brady Statistic RV Percent Paced: 1 %
Date Time Interrogation Session: 20240131010059
HighPow Impedance: 68 Ohm
Implantable Lead Connection Status: 753985
Implantable Lead Connection Status: 753985
Implantable Lead Implant Date: 20120201
Implantable Lead Implant Date: 20120201
Implantable Lead Location: 753859
Implantable Lead Location: 753860
Implantable Pulse Generator Implant Date: 20230131
Lead Channel Impedance Value: 440 Ohm
Lead Channel Impedance Value: 440 Ohm
Lead Channel Pacing Threshold Amplitude: 0.75 V
Lead Channel Pacing Threshold Amplitude: 1.75 V
Lead Channel Pacing Threshold Pulse Width: 0.5 ms
Lead Channel Pacing Threshold Pulse Width: 0.7 ms
Lead Channel Sensing Intrinsic Amplitude: 11.8 mV
Lead Channel Sensing Intrinsic Amplitude: 4.2 mV
Lead Channel Setting Pacing Amplitude: 2 V
Lead Channel Setting Pacing Amplitude: 3.5 V
Lead Channel Setting Pacing Pulse Width: 0.7 ms
Lead Channel Setting Sensing Sensitivity: 0.5 mV
Pulse Gen Serial Number: 111050201
Zone Setting Status: 755011

## 2022-08-13 ENCOUNTER — Ambulatory Visit (HOSPITAL_COMMUNITY)
Admission: RE | Admit: 2022-08-13 | Discharge: 2022-08-13 | Disposition: A | Discharge status: Home / Self Care | Payer: BC Managed Care – PPO | Source: Ambulatory Visit | Attending: Adult Health | Admitting: Adult Health

## 2022-08-13 VITALS — BP 120/80 | HR 70 | Wt 209.0 lb

## 2022-08-13 DIAGNOSIS — F17290 Nicotine dependence, other tobacco product, uncomplicated: Secondary | ICD-10-CM | POA: Insufficient documentation

## 2022-08-13 DIAGNOSIS — I2510 Atherosclerotic heart disease of native coronary artery without angina pectoris: Secondary | ICD-10-CM

## 2022-08-13 DIAGNOSIS — G4733 Obstructive sleep apnea (adult) (pediatric): Secondary | ICD-10-CM | POA: Diagnosis not present

## 2022-08-13 DIAGNOSIS — Z95810 Presence of automatic (implantable) cardiac defibrillator: Secondary | ICD-10-CM | POA: Insufficient documentation

## 2022-08-13 DIAGNOSIS — Z720 Tobacco use: Secondary | ICD-10-CM

## 2022-08-13 DIAGNOSIS — I255 Ischemic cardiomyopathy: Secondary | ICD-10-CM

## 2022-08-13 DIAGNOSIS — G8929 Other chronic pain: Secondary | ICD-10-CM | POA: Insufficient documentation

## 2022-08-13 DIAGNOSIS — Z6828 Body mass index (BMI) 28.0-28.9, adult: Secondary | ICD-10-CM | POA: Insufficient documentation

## 2022-08-13 DIAGNOSIS — M4800 Spinal stenosis, site unspecified: Secondary | ICD-10-CM | POA: Diagnosis not present

## 2022-08-13 DIAGNOSIS — I252 Old myocardial infarction: Secondary | ICD-10-CM | POA: Insufficient documentation

## 2022-08-13 DIAGNOSIS — Z7984 Long term (current) use of oral hypoglycemic drugs: Secondary | ICD-10-CM | POA: Insufficient documentation

## 2022-08-13 DIAGNOSIS — E669 Obesity, unspecified: Secondary | ICD-10-CM | POA: Insufficient documentation

## 2022-08-13 DIAGNOSIS — Z955 Presence of coronary angioplasty implant and graft: Secondary | ICD-10-CM | POA: Diagnosis not present

## 2022-08-13 DIAGNOSIS — F17210 Nicotine dependence, cigarettes, uncomplicated: Secondary | ICD-10-CM | POA: Diagnosis not present

## 2022-08-13 DIAGNOSIS — D751 Secondary polycythemia: Secondary | ICD-10-CM | POA: Insufficient documentation

## 2022-08-13 DIAGNOSIS — Z9981 Dependence on supplemental oxygen: Secondary | ICD-10-CM | POA: Insufficient documentation

## 2022-08-13 DIAGNOSIS — Z7902 Long term (current) use of antithrombotics/antiplatelets: Secondary | ICD-10-CM | POA: Diagnosis not present

## 2022-08-13 DIAGNOSIS — F4322 Adjustment disorder with anxiety: Secondary | ICD-10-CM | POA: Diagnosis not present

## 2022-08-13 DIAGNOSIS — Z7985 Long-term (current) use of injectable non-insulin antidiabetic drugs: Secondary | ICD-10-CM | POA: Diagnosis not present

## 2022-08-13 DIAGNOSIS — J9610 Chronic respiratory failure, unspecified whether with hypoxia or hypercapnia: Secondary | ICD-10-CM

## 2022-08-13 DIAGNOSIS — F32A Depression, unspecified: Secondary | ICD-10-CM | POA: Diagnosis not present

## 2022-08-13 DIAGNOSIS — M549 Dorsalgia, unspecified: Secondary | ICD-10-CM | POA: Insufficient documentation

## 2022-08-13 DIAGNOSIS — I5022 Chronic systolic (congestive) heart failure: Secondary | ICD-10-CM

## 2022-08-13 DIAGNOSIS — Z8659 Personal history of other mental and behavioral disorders: Secondary | ICD-10-CM | POA: Insufficient documentation

## 2022-08-13 DIAGNOSIS — Z79899 Other long term (current) drug therapy: Secondary | ICD-10-CM | POA: Insufficient documentation

## 2022-08-13 LAB — BASIC METABOLIC PANEL
Anion gap: 6 (ref 5–15)
BUN: 10 mg/dL (ref 8–23)
CO2: 31 mmol/L (ref 22–32)
Calcium: 9.4 mg/dL (ref 8.9–10.3)
Chloride: 97 mmol/L — ABNORMAL LOW (ref 98–111)
Creatinine, Ser: 1.24 mg/dL (ref 0.61–1.24)
GFR, Estimated: >60 mL/min (ref >60)
Glucose, Bld: 94 mg/dL (ref 70–99)
Potassium: 3.7 mmol/L (ref 3.5–5.1)
Sodium: 134 mmol/L — ABNORMAL LOW (ref 135–145)

## 2022-08-13 MED ORDER — CARVEDILOL 3.125 MG PO TABS: 3.1250 mg | ORAL_TABLET | Freq: Two times a day (BID) | ORAL | 11 refills | Status: DC

## 2022-08-13 NOTE — Patient Instructions (Signed)
Start Coreg 3.125 mg twice daily - new Rx sent to local pharmacy. Labs today - will call you if abnormal. Keep your follow up appointment with Dr. Haroldine Laws as scheduled.

## 2022-08-13 NOTE — Progress Notes (Signed)
Advanced Heart Failure Clinic Note  Primary Care: Dr Sharlett Iles HF Cardiologist: Dr. Haroldine Laws  HPI: [name] is a 62 y.o. 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 Jude ICD implantation. Was enrolled in Analyze ST.    Has been followed recently by Dr. Stann Mainland at [facility]. Underwent cath at [facility] in 3/15 and had 2.5x14m Xience DES placed in LAD. Had cath 12/15 here with stable CAD. Repeat cath set up in 10/18 but he cancelled.    Echo (8/19): EF 30-35% (stable) mild RV dilation.  Echo 07/18/19 EF 40-45% mild MR   Lost to follow up.  Admitted 10/23 with overdose and NSTEMI.  Had recent 8-week stay at rehab, returned home and found unresponsive. Given narcan in field. Required ICU monitoring and Narcan gtt. Treated for aspiration PNA and AKI. HS Trops elevated, cardiology consulted felt to be type II NSTEMI in setting of drug overdose with no recommended ischemic evaluation. Echo showed EF 25-30%. LHC deferred as reduced EF felt to be related to overdose. General cardiology consulted and GDMT titrated, but limited by AKI. Patient left AMA, weight 235 lbs.  Last HF visit he was started on spironolactone daily.    Today he returns for HF follow up with his wife. Overall feeling fine. Limited by back pain. Denies SOB/PND/Orthopnea. Occasional nausea since he started taking Ozempic. Appetite ok. No fever or chills. Weight at home trending down.  pounds. Taking all medications. Continues to smoke. No longer  drinking alcohol.    Cardiac Studies - Echo (10/23): EF 25-30%, grade I DD, RV mildly reduced  - Echo (1/5/2):1 EF 40-45% mild MR  - Echo (2019): EF 30-35%  - [facility] CAD  - [facility] 2.5x156mXience DES placed in LAD.   Past Medical History:   Diagnosis Date   Anxiety    Automatic implantable cardiac defibrillator St Judes    Analyze ST   Benign prostatic hypertrophy    Benzodiazepine dependence (HCC)    chronic   CAD (coronary artery disease)    Last cath 2/12. 3-v CAD. Failed PCI of distal RCAc  CATH [facility] 4/13 with DES to  LAD X2   CHF (congestive heart failure) (HCC)    Chronic back pain    lumbar stenosis   Chronic systolic heart failure (HCC)    EF 20-25%. s/p ST. Jude ICD   Depression    DJD (degenerative joint disease)    History of testicular cancer    Ischemic cardiomyopathy    [facility]   [place]   OSA on CPAP    Polycythemia, secondary    S/p hematology consultation for polycythema on 01/06/18.  S/p bone marrow biopsy on 12/31/17 that was negative for myeloproliferative disorder.  Feels secondary to sleep apnea, active smoking, testosterone supplementation, nocturnal hypoxia   Substance abuse (HCC)    TIA (transient ischemic attack)    Vitamin B12 deficiency 07/22/2016    Current Outpatient Medications  Medication Sig Dispense Refill   AFRIN 12 HOUR 0.05 % nasal spray Place 1 spray into both nostrils 2 (two) times daily as needed for congestion.     aspirin EC 81 MG tablet Take 81 mg by mouth in the morning. Swallow whole.  calcium carbonate (TUMS - DOSED IN MG ELEMENTAL CALCIUM) 500 MG chewable tablet Chew 1-2 tablets by mouth daily as needed for indigestion or heartburn.     clopidogrel (PLAVIX) 75 MG tablet TAKE ONE TABLET BY MOUTH DAILY 90 tablet 3   dapagliflozin propanediol (FARXIGA) 10 MG TABS tablet Take 1 tablet (10 mg total) by mouth daily before breakfast. 30 tablet 8   ibuprofen (ADVIL,MOTRIN) 200 MG tablet Take 800 mg by mouth every 6 (six) hours as needed for moderate pain.     lisinopril (ZESTRIL) 10 MG tablet Take 1 tablet (10 mg total) by mouth daily. 90 tablet 3   naloxone (NARCAN) nasal spray 4 mg/0.1 mL Place 1 spray into the nose once  as needed (for opioid emergency). 1 each 2   ondansetron (ZOFRAN) 4 MG tablet Take 4 mg by mouth every 8 (eight) hours as needed.     PEPCID COMPLETE 10-800-165 MG chewable tablet Chew 1 tablet by mouth daily as needed (for reflux).     potassium chloride SA (KLOR-CON M) 20 MEQ tablet Take 1 tablet (20 mEq total) by mouth daily. 90 tablet 3   Semaglutide,0.25 or 0.'5MG'$ /DOS, (OZEMPIC, 0.25 OR 0.5 MG/DOSE,) 2 MG/1.5ML SOPN Inject 0.5 mg into the skin once a week.     sildenafil (VIAGRA) 100 MG tablet Take 50 mg by mouth daily as needed.     spironolactone (ALDACTONE) 25 MG tablet Take 1 tablet (25 mg total) by mouth daily. 30 tablet 3   tadalafil (CIALIS) 20 MG tablet Take 10 mg by mouth daily as needed for erectile dysfunction.     tamsulosin (FLOMAX) 0.4 MG CAPS capsule Take 1 capsule (0.4 mg total) by mouth daily after breakfast. 90 capsule 0   Testosterone Cypionate 200 MG/ML KIT Inject 200 mg into the muscle every 14 (fourteen) days.     torsemide (DEMADEX) 20 MG tablet Take 1 tablet (20 mg total) by mouth daily. May take an extra 10 mg (1/2 tablet) as needed for additional swelling in legs 45 tablet 3   traZODone (DESYREL) 100 MG tablet Take 1 tablet (100 mg total) by mouth at bedtime. 90 tablet 0   No current facility-administered medications for this encounter.    Allergies  Allergen Reactions   Darvocet [Propoxyphene N-Acetaminophen] Anaphylaxis    Throat closes   Depakote [Divalproex Sodium] Other (See Comments)    Coded- ended up on a vent   Haldol [Haloperidol] Other (See Comments)    Coded- ended up on a vent   Propoxyphene Anaphylaxis and Swelling      Social History   Socioeconomic History   Marital status: Divorced    Spouse name: Not on file   Number of children: 4   Years of education: Not on file   Highest education level: Not on file  Occupational History   Not on file  Tobacco Use   Smoking status: Every Day    Packs/day: 0.50    Years: 29.00    Total pack  years: 14.50    Types: Cigarettes, E-cigarettes    Last attempt to quit: 07/14/2015    Years since quitting: 7.0   Smokeless tobacco: Never   Tobacco comments:    no cigarette in 4 days but yes to e-cig  Vaping Use   Vaping Use: Every day   Start date: 07/14/2015  Substance and Sexual Activity   Alcohol use: No    Alcohol/week: 0.0 standard drinks of alcohol   Drug use: Yes  Types: Marijuana    Comment: Urine showed THC   Sexual activity: Yes    Partners: Male  Other Topics Concern   Not on file  Social History Narrative   Marital status: married x 12 years; wife is severe alcoholic.      Children: 3 married daughters (23, 81, 10); 1 son (3yo); 3 grandchildren born in 2017      Lives: with wife, son      Left-handed   Caffeine: 3 drinks per day   Social Determinants of Health   Financial Resource Strain: Not on file  Food Insecurity: No Food Insecurity (05/04/2022)   Hunger Vital Sign    Worried About Running Out of Food in the Last Year: Never true    Ran Out of Food in the Last Year: Never true  Transportation Needs: No Transportation Needs (05/04/2022)   PRAPARE - Hydrologist (Medical): No    Lack of Transportation (Non-Medical): No  Physical Activity: Not on file  Stress: Not on file  Social Connections: Not on file  Intimate Partner Violence: Not At Risk (05/04/2022)   Humiliation, Afraid, Rape, and Kick questionnaire    Fear of Current or Ex-Partner: No    Emotionally Abused: No    Physically Abused: No    Sexually Abused: No   Family History  Problem Relation Age of Onset   Heart disease Father 5       Died of MI   Cancer Mother    BP 120/80   Pulse 70   Wt 94.8 kg (209 lb)   SpO2 99%   BMI 28.35 kg/m   Wt Readings from Last 3 Encounters:  08/13/22 94.8 kg (209 lb)  07/07/22 104.4 kg (230 lb 3.2 oz)  06/11/22 106.3 kg (234 lb 6.4 oz)   PHYSICAL EXAM: General:  Arrived in a wheelchair.  No resp difficulty HEENT:  normal Neck: supple. no JVD. Carotids 2+ bilat; no bruits. No lymphadenopathy or thryomegaly appreciated. Cor: PMI nondisplaced. Regular rate & rhythm. No rubs, gallops or murmurs. Lungs: clear Abdomen: soft, nontender, nondistended. No hepatosplenomegaly. No bruits or masses. Good bowel sounds. Extremities: no cyanosis, clubbing, rash, edema Neuro: alert & orientedx3, cranial nerves grossly intact. moves all 4 extremities w/o difficulty. Affect pleasant  ASSESSMENT & PLAN: 1. Chronic systolic HF/iCM - Echo (9/47): EF 30-35%.  - S/p STJ ICD - Echo (07/18/19): EF 40-45% - Echo (10/23): EF 25-30%, grade I DD, RV mildly reduced, felt to be newly reduced due to overdose. - NYHA IIl. Volume status stable. Continue torsemide 20 mg daily.  - Add coreg 3.125 mg twice a day.  - Continue  Farxiga 10 mg daily. - Continue lisinopril 10 mg daily. Consider eventual switch to [facility] if BP allows. - Plans to start working with a trainer again.  - Check BMET  - Unable to interrogate St. Jude device in clinic today.   2. CAD with ischemic CM - Stable by cath 2015. - Recent type II NSTEMI 2/2 overdose. - No chest pain.  - Continue DAPT, statin. - Add beta blocker as noted above.   3. Chronic respiratory failure - Uses oxygen at night.  4. Tobacco use - Continues to smoke.  5. Polycythemia  - Followed by Dr. Annabelle Harman at [facility] and felt to be due to chronic hypoxia from OSA.  - Seen by Dr. Alen Blew who agree.  6. Spinal stenosis - This is severe. - Follows with Ortho and Spine  7. H/o  drug use w/ recent overdose - No longer using - Continues with outpatient counseling.  Follow up next month with Dr Haroldine Laws and an ECHO   Darrick Grinder NP-C  4:23 PM  08/13/22

## 2022-08-14 DIAGNOSIS — F411 Generalized anxiety disorder: Secondary | ICD-10-CM | POA: Diagnosis not present

## 2022-08-17 DIAGNOSIS — F411 Generalized anxiety disorder: Secondary | ICD-10-CM | POA: Diagnosis not present

## 2022-08-20 ENCOUNTER — Ambulatory Visit: Payer: BC Managed Care – PPO

## 2022-08-20 ENCOUNTER — Other Ambulatory Visit (HOSPITAL_COMMUNITY): Payer: Self-pay

## 2022-08-20 ENCOUNTER — Other Ambulatory Visit (HOSPITAL_COMMUNITY): Payer: Self-pay | Admitting: Internal Medicine

## 2022-08-20 DIAGNOSIS — E291 Testicular hypofunction: Secondary | ICD-10-CM | POA: Diagnosis not present

## 2022-08-20 MED ORDER — TESTOSTERONE CYPIONATE 200 MG/ML IM KIT: 200.0000 mg | PACK | INTRAMUSCULAR | 0 refills | Status: DC

## 2022-08-20 MED ORDER — TESTOSTERONE CYPIONATE 200 MG/ML IM KIT: 200.0000 mg | PACK | INTRAMUSCULAR | 0 refills | Status: AC

## 2022-08-21 ENCOUNTER — Telehealth (HOSPITAL_COMMUNITY): Payer: Self-pay | Admitting: *Deleted

## 2022-08-21 DIAGNOSIS — F411 Generalized anxiety disorder: Secondary | ICD-10-CM | POA: Diagnosis not present

## 2022-08-21 NOTE — Telephone Encounter (Signed)
Echo auth    Order ID: [id number]       Authorized  Approval Valid Through: 08/21/2022 - 09/19/2022

## 2022-08-24 ENCOUNTER — Other Ambulatory Visit: Payer: Self-pay | Admitting: Internal Medicine

## 2022-08-24 DIAGNOSIS — M545 Low back pain, unspecified: Secondary | ICD-10-CM

## 2022-08-24 DIAGNOSIS — M21371 Foot drop, right foot: Secondary | ICD-10-CM

## 2022-08-24 DIAGNOSIS — F411 Generalized anxiety disorder: Secondary | ICD-10-CM | POA: Diagnosis not present

## 2022-08-26 ENCOUNTER — Telehealth (HOSPITAL_COMMUNITY): Payer: Self-pay

## 2022-08-26 ENCOUNTER — Other Ambulatory Visit: Payer: Self-pay | Admitting: Internal Medicine

## 2022-08-26 DIAGNOSIS — M21371 Foot drop, right foot: Secondary | ICD-10-CM

## 2022-08-26 NOTE — Telephone Encounter (Signed)
He called and asked about his device. He needs to have an MRI (from his PCP) and I know he as to do at [facility], but he was not sure it was compatible with [place] because it is older. Is it still ok?

## 2022-08-26 NOTE — Telephone Encounter (Signed)
I spoke with [name] and gave him the information. He will call his EP to check compatability.

## 2022-09-03 DIAGNOSIS — R2681 Unsteadiness on feet: Secondary | ICD-10-CM | POA: Diagnosis not present

## 2022-09-04 DIAGNOSIS — J9601 Acute respiratory failure with hypoxia: Secondary | ICD-10-CM | POA: Diagnosis not present

## 2022-09-04 NOTE — Progress Notes (Signed)
Remote ICD transmission.   

## 2022-09-07 DIAGNOSIS — R2681 Unsteadiness on feet: Secondary | ICD-10-CM | POA: Diagnosis not present

## 2022-09-10 DIAGNOSIS — F411 Generalized anxiety disorder: Secondary | ICD-10-CM | POA: Diagnosis not present

## 2022-09-14 DIAGNOSIS — R3912 Poor urinary stream: Secondary | ICD-10-CM | POA: Diagnosis not present

## 2022-09-14 DIAGNOSIS — E291 Testicular hypofunction: Secondary | ICD-10-CM | POA: Diagnosis not present

## 2022-09-14 DIAGNOSIS — N5201 Erectile dysfunction due to arterial insufficiency: Secondary | ICD-10-CM | POA: Diagnosis not present

## 2022-09-14 DIAGNOSIS — N401 Enlarged prostate with lower urinary tract symptoms: Secondary | ICD-10-CM | POA: Diagnosis not present

## 2022-09-15 DIAGNOSIS — R2681 Unsteadiness on feet: Secondary | ICD-10-CM | POA: Diagnosis not present

## 2022-09-17 ENCOUNTER — Encounter (HOSPITAL_COMMUNITY): Payer: Self-pay | Admitting: Internal Medicine

## 2022-09-17 ENCOUNTER — Ambulatory Visit (HOSPITAL_BASED_OUTPATIENT_CLINIC_OR_DEPARTMENT_OTHER)
Admission: RE | Admit: 2022-09-17 | Discharge: 2022-09-17 | Disposition: A | Discharge status: Home / Self Care | Payer: BC Managed Care – PPO | Source: Ambulatory Visit | Attending: Internal Medicine | Admitting: Internal Medicine

## 2022-09-17 ENCOUNTER — Ambulatory Visit (HOSPITAL_COMMUNITY)
Admission: RE | Admit: 2022-09-17 | Discharge: 2022-09-17 | Disposition: A | Discharge status: Home / Self Care | Payer: BC Managed Care – PPO | Source: Ambulatory Visit | Attending: Internal Medicine | Admitting: Internal Medicine

## 2022-09-17 VITALS — BP 124/80 | HR 80 | Wt 171.0 lb

## 2022-09-17 DIAGNOSIS — I5042 Chronic combined systolic (congestive) and diastolic (congestive) heart failure: Secondary | ICD-10-CM

## 2022-09-17 DIAGNOSIS — I5022 Chronic systolic (congestive) heart failure: Secondary | ICD-10-CM

## 2022-09-17 DIAGNOSIS — F1290 Cannabis use, unspecified, uncomplicated: Secondary | ICD-10-CM | POA: Insufficient documentation

## 2022-09-17 DIAGNOSIS — F17290 Nicotine dependence, other tobacco product, uncomplicated: Secondary | ICD-10-CM | POA: Insufficient documentation

## 2022-09-17 DIAGNOSIS — Z9981 Dependence on supplemental oxygen: Secondary | ICD-10-CM | POA: Diagnosis not present

## 2022-09-17 DIAGNOSIS — G8929 Other chronic pain: Secondary | ICD-10-CM | POA: Diagnosis not present

## 2022-09-17 DIAGNOSIS — F17210 Nicotine dependence, cigarettes, uncomplicated: Secondary | ICD-10-CM | POA: Diagnosis not present

## 2022-09-17 DIAGNOSIS — D751 Secondary polycythemia: Secondary | ICD-10-CM | POA: Diagnosis not present

## 2022-09-17 DIAGNOSIS — I2510 Atherosclerotic heart disease of native coronary artery without angina pectoris: Secondary | ICD-10-CM | POA: Diagnosis not present

## 2022-09-17 DIAGNOSIS — Z720 Tobacco use: Secondary | ICD-10-CM | POA: Diagnosis not present

## 2022-09-17 DIAGNOSIS — J9610 Chronic respiratory failure, unspecified whether with hypoxia or hypercapnia: Secondary | ICD-10-CM | POA: Insufficient documentation

## 2022-09-17 DIAGNOSIS — G4733 Obstructive sleep apnea (adult) (pediatric): Secondary | ICD-10-CM | POA: Diagnosis not present

## 2022-09-17 DIAGNOSIS — I255 Ischemic cardiomyopathy: Secondary | ICD-10-CM | POA: Insufficient documentation

## 2022-09-17 DIAGNOSIS — Z95810 Presence of automatic (implantable) cardiac defibrillator: Secondary | ICD-10-CM | POA: Insufficient documentation

## 2022-09-17 DIAGNOSIS — I252 Old myocardial infarction: Secondary | ICD-10-CM | POA: Insufficient documentation

## 2022-09-17 DIAGNOSIS — F1991 Other psychoactive substance use, unspecified, in remission: Secondary | ICD-10-CM | POA: Diagnosis not present

## 2022-09-17 DIAGNOSIS — Z955 Presence of coronary angioplasty implant and graft: Secondary | ICD-10-CM | POA: Insufficient documentation

## 2022-09-17 DIAGNOSIS — Z7902 Long term (current) use of antithrombotics/antiplatelets: Secondary | ICD-10-CM | POA: Diagnosis not present

## 2022-09-17 DIAGNOSIS — Z79899 Other long term (current) drug therapy: Secondary | ICD-10-CM | POA: Insufficient documentation

## 2022-09-17 DIAGNOSIS — M4800 Spinal stenosis, site unspecified: Secondary | ICD-10-CM | POA: Insufficient documentation

## 2022-09-17 LAB — ECHOCARDIOGRAM COMPLETE
Area-P 1/2: 2.76 cm2
Calc EF: 37 %
S' Lateral: 5.8 cm
Single Plane A2C EF: 30.3 %
Single Plane A4C EF: 48 %

## 2022-09-17 MED ORDER — CARVEDILOL 6.25 MG PO TABS: 6.2500 mg | ORAL_TABLET | Freq: Two times a day (BID) | ORAL | 7 refills | Status: DC

## 2022-09-17 MED ORDER — NITROGLYCERIN 0.4 MG/SPRAY TL SOLN: 1.0000 | 12 refills | Status: DC | PRN

## 2022-09-17 MED ORDER — NITROGLYCERIN 0.4 MG/SPRAY TL SOLN: 1.0000 | Status: DC | PRN

## 2022-09-17 NOTE — Progress Notes (Signed)
Echocardiogram 2D Echocardiogram has been performed.  [name] 09/17/2022, 2:03 PM

## 2022-09-17 NOTE — Patient Instructions (Signed)
Good to see you today!  INCREASE Coreg to 6.25 mg Twice daily  Take Nitroglycerin spray as needed for any chest pain  Your physician recommends that you schedule a follow-up appointment in: 6 months(September) Call in July to schedule an appointment  If you have any questions or concerns before your next appointment please send [place] a message through [place] or call our office at [phone].    TO LEAVE A MESSAGE FOR THE NURSE SELECT OPTION 2, PLEASE LEAVE A MESSAGE INCLUDING: YOUR NAME DATE OF BIRTH CALL BACK NUMBER REASON FOR CALL**this is important as we prioritize the call backs  YOU WILL RECEIVE A CALL BACK THE SAME DAY AS LONG AS YOU CALL BEFORE 4:00 PM  At the [facility], you and your health needs are our priority. As part of our continuing mission to provide you with exceptional heart care, we have created designated Provider Care Teams. These Care Teams include your primary Cardiologist (physician) and Advanced Practice Providers (APPs- Physician Assistants and Nurse Practitioners) who all work together to provide you with the care you need, when you need it.   You may see any of the following providers on your designated Care Team at your next follow up: Dr Glori Bickers Dr Loralie Champagne Dr. Roxana Hires, NP Lyda Jester, [place] [facility], [place] Forestine Na, NP Audry Riles, PharmD   Please be sure to bring in all your medications bottles to every appointment.    Thank you for choosing [facility]

## 2022-09-17 NOTE — Progress Notes (Signed)
Advanced Heart Failure Clinic Note  Primary Care: Dr Sharlett Iles HF Cardiologist: Dr. Haroldine Laws  HPI: [name] is a 62 y.o. 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 Jude ICD implantation. Was enrolled in [employer] ST.    Has been followed recently by Dr. Stann Mainland at [facility]. Underwent cath at [facility] in 3/15 and had 2.5x78m Xience DES placed in LAD. Had cath 12/15 here with stable CAD. Repeat cath set up in 10/18 but he cancelled.    Echo (8/19): EF 30-35% (stable) mild RV dilation.  Echo 07/18/19 EF 40-45% mild MR   Lost to follow up.  Admitted 10/23 with overdose and NSTEMI.  Had recent 8-week stay at rehab, returned home and found unresponsive. Given narcan in field. Required ICU monitoring and Narcan gtt. Treated for aspiration PNA and AKI. HS Trops elevated, cardiology consulted felt to be type II NSTEMI in setting of drug overdose with no recommended ischemic evaluation. Echo showed EF 25-30%. LHC deferred as reduced EF felt to be related to overdose. General cardiology consulted and GDMT titrated, but limited by AKI. Patient left AMA, weight 235 lbs.  Last HF visit he was started on spironolactone daily.    He is here for routine f/u. Doing well. No CP or SOB. Off methadone since 10/23. Compliant with meds. Stopped ozempic 3 weeks ago due to nausea. Lost 60 pounds. Riding Nu-step 25 mins. 3x/week  Echo today 09/17/22  EF 35-40% RV mod reduced Personally reviewed   Cardiac Studies - Echo (10/23): EF 25-30%, grade I DD, RV mildly reduced  - Echo (1/5/2):1 EF 40-45% mild MR  - Echo (2019): EF 30-35%  - [facility]  [facility] 2.5x117mXience DES placed in LAD.   Past Medical History:  Diagnosis Date   Anxiety    Automatic implantable  cardiac defibrillator St Judes    Analyze ST   Benign prostatic hypertrophy    Benzodiazepine dependence (HCC)    chronic   CAD (coronary artery disease)    Last cath 2/12. 3-v CAD. Failed PCI of distal RCAc  CATH [facility] 4/13 with DES to  LAD X2   CHF (congestive heart failure) (HCC)    Chronic back pain    lumbar stenosis   Chronic systolic heart failure (HCC)    EF 20-25%. s/p ST. Jude ICD   Depression    DJD (degenerative joint disease)    History of testicular cancer    Ischemic cardiomyopathy    [facility]   [place]   chronic   OSA on CPAP    Polycythemia, secondary    S/p hematology consultation for polycythema on 01/06/18.  S/p bone marrow biopsy on 12/31/17 that was negative for myeloproliferative disorder.  Feels secondary to sleep apnea, active smoking, testosterone supplementation, nocturnal hypoxia   Substance abuse (HCC)    TIA (transient ischemic attack)    Vitamin B12 deficiency 07/22/2016    Current Outpatient Medications  Medication Sig Dispense Refill   AFRIN 12 HOUR 0.05 % nasal spray Place 1 spray into both nostrils 2 (two) times daily as needed for congestion.     aspirin EC 81 MG tablet Take 81 mg by mouth in the morning. Swallow whole.     calcium  carbonate (TUMS - DOSED IN MG ELEMENTAL CALCIUM) 500 MG chewable tablet Chew 1-2 tablets by mouth daily as needed for indigestion or heartburn.     carvedilol (COREG) 3.125 MG tablet Take 1 tablet (3.125 mg total) by mouth 2 (two) times daily with a meal. 60 tablet 11   clopidogrel (PLAVIX) 75 MG tablet TAKE ONE TABLET BY MOUTH DAILY 90 tablet 3   dapagliflozin propanediol (FARXIGA) 10 MG TABS tablet Take 1 tablet (10 mg total) by mouth daily before breakfast. 30 tablet 8   lisinopril (ZESTRIL) 10 MG tablet Take 1 tablet (10 mg total) by mouth daily. 90 tablet 3   naloxone (NARCAN) nasal spray 4 mg/0.1 mL Place 1 spray into the nose once as needed (for opioid emergency). 1 each 2    ondansetron (ZOFRAN) 4 MG tablet Take 4 mg by mouth every 8 (eight) hours as needed.     PEPCID COMPLETE 10-800-165 MG chewable tablet Chew 1 tablet by mouth daily as needed (for reflux).     Semaglutide,0.25 or 0.'5MG'$ /DOS, (OZEMPIC, 0.25 OR 0.5 MG/DOSE,) 2 MG/1.5ML SOPN Inject 0.5 mg into the skin once a week.     sildenafil (VIAGRA) 100 MG tablet Take 50 mg by mouth daily as needed.     spironolactone (ALDACTONE) 25 MG tablet Take 1 tablet (25 mg total) by mouth daily. 30 tablet 3   tadalafil (CIALIS) 20 MG tablet Take 10 mg by mouth daily as needed for erectile dysfunction.     tamsulosin (FLOMAX) 0.4 MG CAPS capsule Take 1 capsule (0.4 mg total) by mouth daily after breakfast. 90 capsule 0   Testosterone Cypionate 200 MG/ML KIT Inject 200 mg into the muscle every 14 (fourteen) days. 2 kit 0   torsemide (DEMADEX) 20 MG tablet Take 1 tablet (20 mg total) by mouth daily. May take an extra 10 mg (1/2 tablet) as needed for additional swelling in legs 45 tablet 3   traZODone (DESYREL) 100 MG tablet Take 100 mg by mouth at bedtime. Can take additional 100 mg if needed     potassium chloride SA (KLOR-CON M) 20 MEQ tablet Take 1 tablet (20 mEq total) by mouth daily. (Patient not taking: Reported on 09/17/2022) 90 tablet 3   No current facility-administered medications for this encounter.    Allergies  Allergen Reactions   Darvocet [Propoxyphene N-Acetaminophen] Anaphylaxis    Throat closes   Depakote [Divalproex Sodium] Other (See Comments)    Coded- ended up on a vent   Haldol [Haloperidol] Other (See Comments)    Coded- ended up on a vent   Propoxyphene Anaphylaxis and Swelling      Social History   Socioeconomic History   Marital status: Divorced    Spouse name: Not on file   Number of children: 4   Years of education: Not on file   Highest education level: Not on file  Occupational History   Not on file  Tobacco Use   Smoking status: Every Day    Packs/day: 0.50    Years: 29.00     Total pack years: 14.50    Types: Cigarettes, E-cigarettes    Last attempt to quit: 07/14/2015    Years since quitting: 7.1   Smokeless tobacco: Never   Tobacco comments:    no cigarette in 4 days but yes to e-cig  Vaping Use   Vaping Use: Every day   Start date: 07/14/2015  Substance and Sexual Activity   Alcohol use: No    Alcohol/week: 0.0 standard  drinks of alcohol   Drug use: Yes    Types: Marijuana    Comment: Urine showed THC   Sexual activity: Yes    Partners: Male  Other Topics Concern   Not on file  Social History Narrative   Marital status: married x 12 years; wife is severe alcoholic.      Children: 3 married daughters (23, 51, 52); 1 son (59yo); 3 grandchildren born in 2017      Lives: with wife, son      Left-handed   Caffeine: 3 drinks per day   Social Determinants of Health   Financial Resource Strain: Not on file  Food Insecurity: No Food Insecurity (05/04/2022)   Hunger Vital Sign    Worried About Running Out of Food in the Last Year: Never true    Ran Out of Food in the Last Year: Never true  Transportation Needs: No Transportation Needs (05/04/2022)   PRAPARE - Hydrologist (Medical): No    Lack of Transportation (Non-Medical): No  Physical Activity: Not on file  Stress: Not on file  Social Connections: Not on file  Intimate Partner Violence: Not At Risk (05/04/2022)   Humiliation, Afraid, Rape, and Kick questionnaire    Fear of Current or Ex-Partner: No    Emotionally Abused: No    Physically Abused: No    Sexually Abused: No   Family History  Problem Relation Age of Onset   Heart disease Father 74       Died of MI   Cancer Mother    BP 124/80   Pulse 80   Wt 77.6 kg (171 lb)   SpO2 93%   BMI 23.19 kg/m   Wt Readings from Last 3 Encounters:  09/17/22 77.6 kg (171 lb)  08/13/22 94.8 kg (209 lb)  07/07/22 104.4 kg (230 lb 3.2 oz)   PHYSICAL EXAM: General:  Well appearing. No resp difficulty HEENT:  normal Neck: supple. no JVD. Carotids 2+ bilat; no bruits. No lymphadenopathy or thryomegaly appreciated. Cor: PMI nondisplaced. Regular rate & rhythm. No rubs, gallops or murmurs. Lungs: clear coarse RLL Abdomen: soft, nontender, nondistended. No hepatosplenomegaly. No bruits or masses. Good bowel sounds. Extremities: no cyanosis, clubbing, rash, edema Neuro: alert & orientedx3, cranial nerves grossly intact. moves all 4 extremities w/o difficulty. Affect pleasant  ASSESSMENT & PLAN: 1. Chronic systolic HF/iCM - Echo ([id number]): EF 30-35%.  - S/p STJ ICD - Echo (07/18/19): EF 40-45% - Echo (10/23): EF 25-30%, grade I DD, RV mildly reduced, felt to be newly reduced due to overdose. - Echo today 09/17/22 EF 35-40% RV mildly down Personally reviewed - Much improved NYHA II. Volume status ok. Continue torsemide 20 mg daily.  - Increase coreg to 6.25 bid mg twice a day.  - Continue  Farxiga 10 mg daily. - Continue lisinopril 10 mg daily. Consider eventual switch to [facility] if BP allows. - Unable to interrogate St. Jude device in clinic today.   2. CAD with ischemic CM - Stable by cath 2015. - Recent type II NSTEMI 2/2 overdose. - No s/s angina - Continue DAPT, statin. - Refill NTG spray at his request  3. Chronic respiratory failure - Uses oxygen at night.  4. Tobacco use - Continues to smoke. Encourages cessation   5. Polycythemia  - Followed by Dr. Annabelle Harman at [facility] and felt to be due to chronic hypoxia from OSA.  - Seen by Dr. Alen Blew who agree.  6. Spinal stenosis - Follows with  [facility]  7. H/o drug use w/ recent overdose - Reports he is no no longer using - Continues with outpatient counseling.   Glori Bickers MD  2:29 PM  09/17/22

## 2022-09-18 DIAGNOSIS — F411 Generalized anxiety disorder: Secondary | ICD-10-CM | POA: Diagnosis not present

## 2022-09-18 DIAGNOSIS — R2681 Unsteadiness on feet: Secondary | ICD-10-CM | POA: Diagnosis not present

## 2022-09-21 ENCOUNTER — Other Ambulatory Visit (HOSPITAL_COMMUNITY): Payer: Self-pay

## 2022-09-21 DIAGNOSIS — F411 Generalized anxiety disorder: Secondary | ICD-10-CM | POA: Diagnosis not present

## 2022-09-21 MED ORDER — NITROGLYCERIN 0.4 MG SL SUBL: 0.4000 mg | SUBLINGUAL_TABLET | SUBLINGUAL | 3 refills | Status: DC | PRN

## 2022-09-25 DIAGNOSIS — F411 Generalized anxiety disorder: Secondary | ICD-10-CM | POA: Diagnosis not present

## 2022-09-28 DIAGNOSIS — F411 Generalized anxiety disorder: Secondary | ICD-10-CM | POA: Diagnosis not present

## 2022-10-02 DIAGNOSIS — F411 Generalized anxiety disorder: Secondary | ICD-10-CM | POA: Diagnosis not present

## 2022-10-03 DIAGNOSIS — J9601 Acute respiratory failure with hypoxia: Secondary | ICD-10-CM | POA: Diagnosis not present

## 2022-10-05 DIAGNOSIS — F411 Generalized anxiety disorder: Secondary | ICD-10-CM | POA: Diagnosis not present

## 2022-10-12 DIAGNOSIS — F411 Generalized anxiety disorder: Secondary | ICD-10-CM | POA: Diagnosis not present

## 2022-10-13 ENCOUNTER — Other Ambulatory Visit (HOSPITAL_COMMUNITY): Payer: Self-pay | Admitting: Family Medicine

## 2022-10-13 ENCOUNTER — Ambulatory Visit (HOSPITAL_COMMUNITY)
Admission: RE | Admit: 2022-10-13 | Discharge: 2022-10-13 | Disposition: A | Discharge status: Home / Self Care | Payer: BC Managed Care – PPO | Source: Ambulatory Visit | Attending: Family Medicine | Admitting: Family Medicine

## 2022-10-13 DIAGNOSIS — M7989 Other specified soft tissue disorders: Secondary | ICD-10-CM

## 2022-10-13 DIAGNOSIS — G8929 Other chronic pain: Secondary | ICD-10-CM | POA: Diagnosis not present

## 2022-10-13 DIAGNOSIS — R110 Nausea: Secondary | ICD-10-CM | POA: Diagnosis not present

## 2022-10-13 DIAGNOSIS — E1151 Type 2 diabetes mellitus with diabetic peripheral angiopathy without gangrene: Secondary | ICD-10-CM | POA: Diagnosis not present

## 2022-10-13 NOTE — Progress Notes (Signed)
Lower extremity venous right study completed.  Preliminary results voicemail left for Philip Aspen, MD.   See CV Proc for preliminary results report.   Darlin Coco, RDMS, RVT

## 2022-10-16 DIAGNOSIS — F411 Generalized anxiety disorder: Secondary | ICD-10-CM | POA: Diagnosis not present

## 2022-10-20 DIAGNOSIS — F411 Generalized anxiety disorder: Secondary | ICD-10-CM | POA: Diagnosis not present

## 2022-10-23 DIAGNOSIS — F411 Generalized anxiety disorder: Secondary | ICD-10-CM | POA: Diagnosis not present

## 2022-10-26 DIAGNOSIS — F411 Generalized anxiety disorder: Secondary | ICD-10-CM | POA: Diagnosis not present

## 2022-10-29 DIAGNOSIS — I5022 Chronic systolic (congestive) heart failure: Secondary | ICD-10-CM | POA: Diagnosis not present

## 2022-10-29 DIAGNOSIS — Z1331 Encounter for screening for depression: Secondary | ICD-10-CM | POA: Diagnosis not present

## 2022-10-29 DIAGNOSIS — Z1339 Encounter for screening examination for other mental health and behavioral disorders: Secondary | ICD-10-CM | POA: Diagnosis not present

## 2022-10-29 DIAGNOSIS — I1310 Hypertensive heart and chronic kidney disease without heart failure, with stage 1 through stage 4 chronic kidney disease, or unspecified chronic kidney disease: Secondary | ICD-10-CM | POA: Diagnosis not present

## 2022-10-29 DIAGNOSIS — M5416 Radiculopathy, lumbar region: Secondary | ICD-10-CM | POA: Diagnosis not present

## 2022-10-29 DIAGNOSIS — E1151 Type 2 diabetes mellitus with diabetic peripheral angiopathy without gangrene: Secondary | ICD-10-CM | POA: Diagnosis not present

## 2022-11-02 DIAGNOSIS — R2681 Unsteadiness on feet: Secondary | ICD-10-CM | POA: Diagnosis not present

## 2022-11-03 DIAGNOSIS — J9601 Acute respiratory failure with hypoxia: Secondary | ICD-10-CM | POA: Diagnosis not present

## 2022-11-03 DIAGNOSIS — F411 Generalized anxiety disorder: Secondary | ICD-10-CM | POA: Diagnosis not present

## 2022-11-04 DIAGNOSIS — R2681 Unsteadiness on feet: Secondary | ICD-10-CM | POA: Diagnosis not present

## 2022-11-09 DIAGNOSIS — F411 Generalized anxiety disorder: Secondary | ICD-10-CM | POA: Diagnosis not present

## 2022-11-11 ENCOUNTER — Ambulatory Visit (INDEPENDENT_AMBULATORY_CARE_PROVIDER_SITE_OTHER): Payer: BC Managed Care – PPO

## 2022-11-11 DIAGNOSIS — I255 Ischemic cardiomyopathy: Secondary | ICD-10-CM

## 2022-11-12 ENCOUNTER — Telehealth: Payer: Self-pay

## 2022-11-12 LAB — CUP PACEART REMOTE DEVICE CHECK
Battery Remaining Longevity: 89 mo
Battery Remaining Percentage: 87 %
Battery Voltage: 3.01 V
Brady Statistic AP VP Percent: 1 %
Brady Statistic AP VS Percent: 1.5 %
Brady Statistic AS VP Percent: 1 %
Brady Statistic AS VS Percent: 97 %
Brady Statistic RA Percent Paced: 1 %
Brady Statistic RV Percent Paced: 1 %
Date Time Interrogation Session: 20240502103650
HighPow Impedance: 71 Ohm
Implantable Lead Connection Status: 753985
Implantable Lead Connection Status: 753985
Implantable Lead Implant Date: 20120201
Implantable Lead Implant Date: 20120201
Implantable Lead Location: 753859
Implantable Lead Location: 753860
Implantable Pulse Generator Implant Date: 20230131
Lead Channel Impedance Value: 400 Ohm
Lead Channel Impedance Value: 440 Ohm
Lead Channel Pacing Threshold Amplitude: 0.75 V
Lead Channel Pacing Threshold Amplitude: 1.75 V
Lead Channel Pacing Threshold Pulse Width: 0.5 ms
Lead Channel Pacing Threshold Pulse Width: 0.7 ms
Lead Channel Sensing Intrinsic Amplitude: 12 mV
Lead Channel Sensing Intrinsic Amplitude: 4.5 mV
Lead Channel Setting Pacing Amplitude: 2 V
Lead Channel Setting Pacing Amplitude: 3.5 V
Lead Channel Setting Pacing Pulse Width: 0.7 ms
Lead Channel Setting Sensing Sensitivity: 0.5 mV
Pulse Gen Serial Number: 111050201
Zone Setting Status: 755011

## 2022-11-12 NOTE — Telephone Encounter (Signed)
Scheduled remote reviewed. Normal device function.    32 NSVT, EGM's from 4/10 and 4/11 appear AF with rapid response,  1 SVT, 12sec in duration rate 200, some likely VT as well as AF with RVR 5 AF, longest duration 18sec, burden <1%, no OAC per EPIC.  Route to triage Next remote 91 days. LA, CVRS  Spoke with patient.  He states that early in April he was retaining fluid and put on extra lasix. Being followed carefully by Dr. Gala Romney. Also notes that he remembers feeling heart racing frequently and numbness down both arms during that time.  He denies missing any medications and has been asymptomatic since and doing well.  Getting ready to go out of town traveling.  He has ER precautions if symptoms return or worsen.   Will forward to Dr Graciela Husbands and covering provider while he is out of town. Also will cc: Dr. Gala Romney.             LONGEST DEVICE FLAGGED NSVT EPISODE : 34 SECS.

## 2022-11-16 DIAGNOSIS — R2681 Unsteadiness on feet: Secondary | ICD-10-CM | POA: Diagnosis not present

## 2022-11-18 NOTE — Telephone Encounter (Signed)
This is all afib with aberration and not VT

## 2022-11-23 DIAGNOSIS — R2681 Unsteadiness on feet: Secondary | ICD-10-CM | POA: Diagnosis not present

## 2022-12-01 DIAGNOSIS — R2681 Unsteadiness on feet: Secondary | ICD-10-CM | POA: Diagnosis not present

## 2022-12-02 NOTE — Progress Notes (Signed)
Remote ICD transmission.   

## 2022-12-03 DIAGNOSIS — R2681 Unsteadiness on feet: Secondary | ICD-10-CM | POA: Diagnosis not present

## 2022-12-03 DIAGNOSIS — J9601 Acute respiratory failure with hypoxia: Secondary | ICD-10-CM | POA: Diagnosis not present

## 2022-12-06 ENCOUNTER — Other Ambulatory Visit (HOSPITAL_COMMUNITY): Payer: Self-pay | Admitting: Internal Medicine

## 2022-12-08 DIAGNOSIS — R2681 Unsteadiness on feet: Secondary | ICD-10-CM | POA: Diagnosis not present

## 2022-12-15 DIAGNOSIS — R2681 Unsteadiness on feet: Secondary | ICD-10-CM | POA: Diagnosis not present

## 2023-01-03 DIAGNOSIS — J9601 Acute respiratory failure with hypoxia: Secondary | ICD-10-CM | POA: Diagnosis not present

## 2023-01-12 DIAGNOSIS — F4322 Adjustment disorder with anxiety: Secondary | ICD-10-CM | POA: Diagnosis not present

## 2023-01-15 ENCOUNTER — Other Ambulatory Visit (HOSPITAL_COMMUNITY): Payer: Self-pay | Admitting: Internal Medicine

## 2023-01-15 DIAGNOSIS — M21371 Foot drop, right foot: Secondary | ICD-10-CM

## 2023-01-29 DIAGNOSIS — F411 Generalized anxiety disorder: Secondary | ICD-10-CM | POA: Diagnosis not present

## 2023-02-01 DIAGNOSIS — F411 Generalized anxiety disorder: Secondary | ICD-10-CM | POA: Diagnosis not present

## 2023-02-02 DIAGNOSIS — J9601 Acute respiratory failure with hypoxia: Secondary | ICD-10-CM | POA: Diagnosis not present

## 2023-02-03 DIAGNOSIS — R2681 Unsteadiness on feet: Secondary | ICD-10-CM | POA: Diagnosis not present

## 2023-02-08 DIAGNOSIS — R2681 Unsteadiness on feet: Secondary | ICD-10-CM | POA: Diagnosis not present

## 2023-02-10 ENCOUNTER — Ambulatory Visit (INDEPENDENT_AMBULATORY_CARE_PROVIDER_SITE_OTHER): Payer: BC Managed Care – PPO

## 2023-02-10 DIAGNOSIS — I255 Ischemic cardiomyopathy: Secondary | ICD-10-CM

## 2023-02-10 DIAGNOSIS — R2681 Unsteadiness on feet: Secondary | ICD-10-CM | POA: Diagnosis not present

## 2023-02-10 LAB — CUP PACEART REMOTE DEVICE CHECK
Battery Remaining Longevity: 88 mo
Battery Remaining Percentage: 84 %
Battery Voltage: 3.01 V
Brady Statistic AP VP Percent: 1 %
Brady Statistic AP VS Percent: 2.2 %
Brady Statistic AS VP Percent: 1 %
Brady Statistic AS VS Percent: 96 %
Brady Statistic RA Percent Paced: 1.6 %
Brady Statistic RV Percent Paced: 1 %
Date Time Interrogation Session: 20240731022714
HighPow Impedance: 73 Ohm
Implantable Lead Connection Status: 753985
Implantable Lead Connection Status: 753985
Implantable Lead Implant Date: 20120201
Implantable Lead Implant Date: 20120201
Implantable Lead Location: 753859
Implantable Lead Location: 753860
Implantable Pulse Generator Implant Date: 20230131
Lead Channel Impedance Value: 460 Ohm
Lead Channel Impedance Value: 480 Ohm
Lead Channel Pacing Threshold Amplitude: 0.75 V
Lead Channel Pacing Threshold Amplitude: 1.75 V
Lead Channel Pacing Threshold Pulse Width: 0.5 ms
Lead Channel Pacing Threshold Pulse Width: 0.7 ms
Lead Channel Sensing Intrinsic Amplitude: 11.8 mV
Lead Channel Sensing Intrinsic Amplitude: 5 mV
Lead Channel Setting Pacing Amplitude: 2 V
Lead Channel Setting Pacing Amplitude: 3.5 V
Lead Channel Setting Pacing Pulse Width: 0.7 ms
Lead Channel Setting Sensing Sensitivity: 0.5 mV
Pulse Gen Serial Number: 111050201
Zone Setting Status: 755011

## 2023-02-12 DIAGNOSIS — F411 Generalized anxiety disorder: Secondary | ICD-10-CM | POA: Diagnosis not present

## 2023-02-16 DIAGNOSIS — R2681 Unsteadiness on feet: Secondary | ICD-10-CM | POA: Diagnosis not present

## 2023-02-16 DIAGNOSIS — F411 Generalized anxiety disorder: Secondary | ICD-10-CM | POA: Diagnosis not present

## 2023-02-19 DIAGNOSIS — R2681 Unsteadiness on feet: Secondary | ICD-10-CM | POA: Diagnosis not present

## 2023-02-22 DIAGNOSIS — R2681 Unsteadiness on feet: Secondary | ICD-10-CM | POA: Diagnosis not present

## 2023-02-25 NOTE — Progress Notes (Signed)
Remote ICD transmission.   

## 2023-03-03 DIAGNOSIS — R2681 Unsteadiness on feet: Secondary | ICD-10-CM | POA: Diagnosis not present

## 2023-03-05 DIAGNOSIS — J9601 Acute respiratory failure with hypoxia: Secondary | ICD-10-CM | POA: Diagnosis not present

## 2023-03-09 ENCOUNTER — Encounter (INDEPENDENT_AMBULATORY_CARE_PROVIDER_SITE_OTHER): Payer: Self-pay

## 2023-03-12 ENCOUNTER — Other Ambulatory Visit (HOSPITAL_COMMUNITY): Payer: Self-pay | Admitting: Internal Medicine

## 2023-03-16 DIAGNOSIS — R3912 Poor urinary stream: Secondary | ICD-10-CM | POA: Diagnosis not present

## 2023-03-16 DIAGNOSIS — N401 Enlarged prostate with lower urinary tract symptoms: Secondary | ICD-10-CM | POA: Diagnosis not present

## 2023-03-16 DIAGNOSIS — E291 Testicular hypofunction: Secondary | ICD-10-CM | POA: Diagnosis not present

## 2023-03-19 DIAGNOSIS — F411 Generalized anxiety disorder: Secondary | ICD-10-CM | POA: Diagnosis not present

## 2023-03-22 DIAGNOSIS — N401 Enlarged prostate with lower urinary tract symptoms: Secondary | ICD-10-CM | POA: Diagnosis not present

## 2023-03-22 DIAGNOSIS — N5201 Erectile dysfunction due to arterial insufficiency: Secondary | ICD-10-CM | POA: Diagnosis not present

## 2023-03-22 DIAGNOSIS — E291 Testicular hypofunction: Secondary | ICD-10-CM | POA: Diagnosis not present

## 2023-03-22 DIAGNOSIS — R3916 Straining to void: Secondary | ICD-10-CM | POA: Diagnosis not present

## 2023-03-25 DIAGNOSIS — R2681 Unsteadiness on feet: Secondary | ICD-10-CM | POA: Diagnosis not present

## 2023-03-29 DIAGNOSIS — Z23 Encounter for immunization: Secondary | ICD-10-CM | POA: Diagnosis not present

## 2023-03-29 DIAGNOSIS — J449 Chronic obstructive pulmonary disease, unspecified: Secondary | ICD-10-CM | POA: Diagnosis not present

## 2023-03-29 DIAGNOSIS — E1151 Type 2 diabetes mellitus with diabetic peripheral angiopathy without gangrene: Secondary | ICD-10-CM | POA: Diagnosis not present

## 2023-03-29 DIAGNOSIS — I129 Hypertensive chronic kidney disease with stage 1 through stage 4 chronic kidney disease, or unspecified chronic kidney disease: Secondary | ICD-10-CM | POA: Diagnosis not present

## 2023-03-31 ENCOUNTER — Ambulatory Visit (HOSPITAL_COMMUNITY)
Admission: RE | Admit: 2023-03-31 | Discharge: 2023-03-31 | Disposition: A | Discharge status: Home / Self Care | Payer: BC Managed Care – PPO | Source: Ambulatory Visit | Attending: Internal Medicine | Admitting: Internal Medicine

## 2023-03-31 DIAGNOSIS — M21371 Foot drop, right foot: Secondary | ICD-10-CM | POA: Diagnosis not present

## 2023-03-31 DIAGNOSIS — M4186 Other forms of scoliosis, lumbar region: Secondary | ICD-10-CM | POA: Diagnosis not present

## 2023-03-31 DIAGNOSIS — M5127 Other intervertebral disc displacement, lumbosacral region: Secondary | ICD-10-CM | POA: Diagnosis not present

## 2023-03-31 DIAGNOSIS — M48061 Spinal stenosis, lumbar region without neurogenic claudication: Secondary | ICD-10-CM | POA: Diagnosis not present

## 2023-03-31 NOTE — OR Nursing (Addendum)
Pt is hypotensive prior to scan. BP was 93/57, HR-86 and post scan BP was 84/52, HR 86 with ICD deactivated. Pt stated he took 2mg  of Ativan in preparation of scan and feels very relaxed. Pt denied feeling light headed or dizzy. He also refused care in the ED for further evaluation. Pt was strongly encouraged to follow up with cardiologist/PCP about medication since his BP runs low.

## 2023-04-05 DIAGNOSIS — J9601 Acute respiratory failure with hypoxia: Secondary | ICD-10-CM | POA: Diagnosis not present

## 2023-04-13 DIAGNOSIS — Z5181 Encounter for therapeutic drug level monitoring: Secondary | ICD-10-CM | POA: Diagnosis not present

## 2023-04-13 DIAGNOSIS — F4322 Adjustment disorder with anxiety: Secondary | ICD-10-CM | POA: Diagnosis not present

## 2023-04-15 ENCOUNTER — Encounter (HOSPITAL_COMMUNITY): Payer: Self-pay | Admitting: Internal Medicine

## 2023-04-22 DIAGNOSIS — F411 Generalized anxiety disorder: Secondary | ICD-10-CM | POA: Diagnosis not present

## 2023-04-26 DIAGNOSIS — F411 Generalized anxiety disorder: Secondary | ICD-10-CM | POA: Diagnosis not present

## 2023-05-05 DIAGNOSIS — F411 Generalized anxiety disorder: Secondary | ICD-10-CM | POA: Diagnosis not present

## 2023-05-12 ENCOUNTER — Ambulatory Visit (INDEPENDENT_AMBULATORY_CARE_PROVIDER_SITE_OTHER): Payer: BC Managed Care – PPO

## 2023-05-12 DIAGNOSIS — I255 Ischemic cardiomyopathy: Secondary | ICD-10-CM | POA: Diagnosis not present

## 2023-05-14 LAB — CUP PACEART REMOTE DEVICE CHECK
Battery Remaining Longevity: 85 mo
Battery Remaining Percentage: 82 %
Battery Voltage: 3.01 V
Brady Statistic AP VP Percent: 1 %
Brady Statistic AP VS Percent: 2.5 %
Brady Statistic AS VP Percent: 1 %
Brady Statistic AS VS Percent: 95 %
Brady Statistic RA Percent Paced: 1.7 %
Brady Statistic RV Percent Paced: 1 %
Date Time Interrogation Session: 20241031141103
HighPow Impedance: 66 Ohm
Implantable Lead Connection Status: 753985
Implantable Lead Connection Status: 753985
Implantable Lead Implant Date: 20120201
Implantable Lead Implant Date: 20120201
Implantable Lead Location: 753859
Implantable Lead Location: 753860
Implantable Pulse Generator Implant Date: 20230131
Lead Channel Impedance Value: 430 Ohm
Lead Channel Impedance Value: 440 Ohm
Lead Channel Pacing Threshold Amplitude: 0.75 V
Lead Channel Pacing Threshold Amplitude: 1.25 V
Lead Channel Pacing Threshold Pulse Width: 0.5 ms
Lead Channel Pacing Threshold Pulse Width: 0.7 ms
Lead Channel Sensing Intrinsic Amplitude: 11.8 mV
Lead Channel Sensing Intrinsic Amplitude: 4.2 mV
Lead Channel Setting Pacing Amplitude: 2 V
Lead Channel Setting Pacing Amplitude: 3.5 V
Lead Channel Setting Pacing Pulse Width: 0.7 ms
Lead Channel Setting Sensing Sensitivity: 0.5 mV
Pulse Gen Serial Number: 111050201
Zone Setting Status: 755011

## 2023-05-17 DIAGNOSIS — I10 Essential (primary) hypertension: Secondary | ICD-10-CM | POA: Diagnosis not present

## 2023-05-17 DIAGNOSIS — Z1389 Encounter for screening for other disorder: Secondary | ICD-10-CM | POA: Diagnosis not present

## 2023-05-17 DIAGNOSIS — M4800 Spinal stenosis, site unspecified: Secondary | ICD-10-CM | POA: Diagnosis not present

## 2023-05-17 DIAGNOSIS — M48062 Spinal stenosis, lumbar region with neurogenic claudication: Secondary | ICD-10-CM | POA: Diagnosis not present

## 2023-05-17 DIAGNOSIS — R634 Abnormal weight loss: Secondary | ICD-10-CM | POA: Diagnosis not present

## 2023-05-17 DIAGNOSIS — Z6826 Body mass index (BMI) 26.0-26.9, adult: Secondary | ICD-10-CM | POA: Diagnosis not present

## 2023-05-18 DIAGNOSIS — F411 Generalized anxiety disorder: Secondary | ICD-10-CM | POA: Diagnosis not present

## 2023-05-19 DIAGNOSIS — R634 Abnormal weight loss: Secondary | ICD-10-CM | POA: Diagnosis not present

## 2023-05-25 DIAGNOSIS — F411 Generalized anxiety disorder: Secondary | ICD-10-CM | POA: Diagnosis not present

## 2023-05-28 DIAGNOSIS — F17200 Nicotine dependence, unspecified, uncomplicated: Secondary | ICD-10-CM | POA: Diagnosis not present

## 2023-05-28 DIAGNOSIS — K921 Melena: Secondary | ICD-10-CM | POA: Diagnosis not present

## 2023-05-28 DIAGNOSIS — K219 Gastro-esophageal reflux disease without esophagitis: Secondary | ICD-10-CM | POA: Diagnosis not present

## 2023-05-31 NOTE — Progress Notes (Signed)
Remote ICD transmission.   

## 2023-06-01 DIAGNOSIS — R2681 Unsteadiness on feet: Secondary | ICD-10-CM | POA: Diagnosis not present

## 2023-06-07 DIAGNOSIS — R2681 Unsteadiness on feet: Secondary | ICD-10-CM | POA: Diagnosis not present

## 2023-06-09 DIAGNOSIS — T22232A Burn of second degree of left upper arm, initial encounter: Secondary | ICD-10-CM | POA: Diagnosis not present

## 2023-06-09 NOTE — Progress Notes (Unsigned)
Cardiology Office Note Date:  06/09/2023  Patient ID:  [name], [name] 24-Aug-1960, MRN [id number] PCP:  Garlan Fillers, MD  Cardiologist:  Dr. Gala Romney Electrophysiologist: Dr. Graciela Husbands    Chief Complaint:  *** annual visit  History of Present Illness: [name] is a 62 y.o. male with history of MAI lung infection (dx in 2012), polycythemia vera (felt 2/2 chronic hypoxia from OSA), CAD (PCI 2015 RCA), ICM, chronic CHF (systolic), spinal stenosis with CBP  Admitted 02/14/21 with AMS and reports of ICD shocks.  He was hypotensive, labs without explanation, ETOH level <10, pt reported having taken something called a "Delta 10" , given naloxone and IVF with improvement > ICU on Narcan gtt.  Though ultimately left AMA ICD was ERI only a day, advised he needed gen change within 3 mo time  He saw Dr. Graciela Husbands 07/21/21. Noted + hx of appropriate tx w/ATP for VT.  Pt had self stopped his ACE, discussed importance of medical therepy, there was some reluctance in gen change procedure by Dr. Graciela Husbands, though ultimately decided to pursue. NO EP f/u since his gen change  Hospitalized 05/04/22, found unresponsive at home, treated with Narcan in the field (note he had just completed 8-weeks of rehab) with improved mental status Placed on narcan gtt in ICU had another episode of lethargy. Pt had reported at home taking methadone, but not sure how much, mentioned he was in a lot of pain. Treated for possible aspiration pneumonia HS Trops elevated, cardiology consulted felt to be type II NSTEMI in setting of drug overdose with no recommended ischemic evaluation. Did require O2 for home Discharged 10/23  Pt has called a couple times with questions regarding his medicines. Missed his EP follow up 10/24  He saw the HF team 06/10/22.  Was doing ok. Denied any CP, palpitations No near syncope or syncope. Occasionally dizzy + DOE Wearing O2 at night Started on Farxiga, other meds continued with  plans to have pharmacy follow up for advancement of GDMT to get him back on sprio and BB and an echo in the future.  I saw him 06/11/22 He is accompanied by his wife, the patient does not drive He comes ambulating with a walker, looks good/sturdy. They report no changes since his visit yesterday except nauseous this AM, vomited once in the driveway. No fever, + nasal congestion. He forgot to ask for refills yesterday and requests that we send them in. Device noted  SCAF March had treated VT in the VF zone (was apparently hospitalized in [facility] at the time) No changes were made  Following with the HF team Last seen 09/17/22 by Dr. Gala Romney, doing well, stopped Ozemic 2/2 nausea, echo with EF 35-40% RV mod reduced  Class II symptoms, coreg increased  Reported no drug use. Reported unable to interrogate his device  + remotes  *** device  *** volume *** VT? *** SCAF   Device information Abbott dual chamber ICD implanted 08/13/2010 , gen change 08/11/21  + hx of appropriate tx March 2023  Past Medical History:  Diagnosis Date   Anxiety    Automatic implantable cardiac defibrillator St Judes    Analyze ST   Benign prostatic hypertrophy    Benzodiazepine dependence (HCC)    chronic   CAD (coronary artery disease)    Last cath 2/12. 3-v CAD. Failed PCI of distal RCAc  CATH [facility] 4/13 with DES to  LAD X2   CHF (congestive heart failure) (HCC)  Chronic back pain    lumbar stenosis   Chronic systolic heart failure (HCC)    EF 20-25%. s/p ST. Jude ICD   Depression    DJD (degenerative joint disease)    History of testicular cancer    Ischemic cardiomyopathy    Myocardial infarction [facility])    Narcotic dependence (HCC)    chronic   OSA on CPAP    Polycythemia, secondary    S/p hematology consultation for polycythema on 01/06/18.  S/p bone marrow biopsy on 12/31/17 that was negative for myeloproliferative disorder.  Feels secondary to sleep apnea, active smoking, testosterone  supplementation, nocturnal hypoxia   Substance abuse (HCC)    TIA (transient ischemic attack)    Vitamin B12 deficiency 07/22/2016    Past Surgical History:  Procedure Laterality Date   ASD REPAIR, SINUS VENOSUS     CARDIAC DEFIBRILLATOR PLACEMENT  08/2010   HERNIA REPAIR     ICD GENERATOR CHANGEOUT N/A 08/11/2021   Procedure: ICD GENERATOR CHANGEOUT;  Surgeon: Duke Salvia, MD;  Location: [facility] INVASIVE CV LAB;  Service: Cardiovascular;  Laterality: N/A;   LEFT HEART CATHETERIZATION WITH CORONARY ANGIOGRAM N/A 06/14/2014   Procedure: LEFT HEART CATHETERIZATION WITH CORONARY ANGIOGRAM;  Surgeon: Dolores Patty, MD;  Location: [facility] CATH LAB;  Service: Cardiovascular;  Laterality: N/A;   TESTICLE SURGERY     testicular cancer surgery left testicle removed    Current Outpatient Medications  Medication Sig Dispense Refill   AFRIN 12 HOUR 0.05 % nasal spray Place 1 spray into both nostrils 2 (two) times daily as needed for congestion.     aspirin EC 81 MG tablet Take 81 mg by mouth in the morning. Swallow whole.     calcium carbonate (TUMS - DOSED IN MG ELEMENTAL CALCIUM) 500 MG chewable tablet Chew 1-2 tablets by mouth daily as needed for indigestion or heartburn.     carvedilol (COREG) 6.25 MG tablet Take 1 tablet (6.25 mg total) by mouth 2 (two) times daily with a meal. 60 tablet 7   clopidogrel (PLAVIX) 75 MG tablet TAKE ONE TABLET BY MOUTH DAILY 90 tablet 3   dapagliflozin propanediol (FARXIGA) 10 MG TABS tablet Take 1 tablet (10 mg total) by mouth daily before breakfast. 30 tablet 8   lisinopril (ZESTRIL) 10 MG tablet Take 1 tablet (10 mg total) by mouth daily. 90 tablet 3   naloxone (NARCAN) nasal spray 4 mg/0.1 mL Place 1 spray into the nose once as needed (for opioid emergency). 1 each 2   nitroGLYCERIN (NITROSTAT) 0.4 MG SL tablet Place 1 tablet (0.4 mg total) under the tongue every 5 (five) minutes as needed for chest pain. 90 tablet 3   ondansetron (ZOFRAN) 4 MG tablet Take 4 mg  by mouth every 8 (eight) hours as needed.     PEPCID COMPLETE 10-800-165 MG chewable tablet Chew 1 tablet by mouth daily as needed (for reflux).     potassium chloride SA (KLOR-CON M) 20 MEQ tablet Take 1 tablet (20 mEq total) by mouth daily. (Patient not taking: Reported on 09/17/2022) 90 tablet 3   Semaglutide,0.25 or 0.5MG /DOS, (OZEMPIC, 0.25 OR 0.5 MG/DOSE,) 2 MG/1.5ML SOPN Inject 0.5 mg into the skin once a week.     sildenafil (VIAGRA) 100 MG tablet Take 50 mg by mouth daily as needed.     spironolactone (ALDACTONE) 25 MG tablet Take 1 tablet (25 mg total) by mouth daily. 30 tablet 3   tadalafil (CIALIS) 20 MG tablet Take 10 mg by mouth  daily as needed for erectile dysfunction.     tamsulosin (FLOMAX) 0.4 MG CAPS capsule Take 1 capsule (0.4 mg total) by mouth daily after breakfast. 90 capsule 0   Testosterone Cypionate 200 MG/ML KIT Inject 200 mg into the muscle every 14 (fourteen) days. 2 kit 0   torsemide (DEMADEX) 20 MG tablet Take 1 tablet (20 mg total) by mouth daily. May take an extra 10 mg (1/2 tablet) as needed for additional swelling in legs 45 tablet 3   traZODone (DESYREL) 100 MG tablet Take 100 mg by mouth at bedtime. Can take additional 100 mg if needed     No current facility-administered medications for this visit.    Allergies:   Darvocet [propoxyphene n-acetaminophen], Depakote [divalproex sodium], Haldol [haloperidol], and Propoxyphene   Social History:  The patient  reports that he has been smoking cigarettes and e-cigarettes. He started smoking about 36 years ago. He has a 14.5 pack-year smoking history. He has never used smokeless tobacco. He reports current drug use. Drug: Marijuana. He reports that he does not drink alcohol.   Family History:  The patient's family history includes Cancer in his mother; Heart disease (age of onset: 73) in his father.  ROS:  Please see the history of present illness.    All other systems are reviewed and otherwise negative.   PHYSICAL  EXAM:  VS:  There were no vitals taken for this visit. BMI: There is no height or weight on file to calculate BMI. Well nourished, well developed, in no acute distress, chronically ill appearing, looks older then his age HEENT: normocephalic, atraumatic Neck: no JVD, carotid bruits or masses Cardiac:  *** RRR; no significant murmurs, no rubs, or gallops Lungs:  ***, rhonchi or rales Abd: soft, nontender MS: no deformity or atrophy Ext: *** 1+ edema b/l Skin: warm and dry, no rash Neuro:  No gross deficits appreciated Psych: euthymic mood, full affect  *** ICD site is stable, no tethering or discomfort   EKG:  not done today ***  Device interrogation done today and reviewed by myself:  *** Battery and lead measurements are good ***    09/17/22: TTE 1. Left ventricular ejection fraction, by estimation, is 35 to 40%. The  left ventricle has moderately decreased function. The left ventricle  demonstrates regional wall motion abnormalities (see scoring  diagram/findings for description). Left ventricular   diastolic parameters are consistent with Grade I diastolic dysfunction  (impaired relaxation). There is hypokinesis of the left ventricular,  entire inferior wall and inferolateral wall.   2. Right ventricular systolic function is mildly reduced. The right  ventricular size is normal. There is normal pulmonary artery systolic  pressure.   3. Left atrial size was mildly dilated.   4. The mitral valve is normal in structure. Trivial mitral valve  regurgitation. No evidence of mitral stenosis.   5. The aortic valve is tricuspid. Aortic valve regurgitation is not  visualized. No aortic stenosis is present.   6. The inferior vena cava is normal in size with greater than 50%  respiratory variability, suggesting right atrial pressure of 3 mmHg.    Echo 05/04/2022 IMPRESSIONS  1. Left ventricular ejection fraction, by estimation, is 25 to 30%. The  left ventricle has severely  decreased function. The left ventricle  demonstrates global hypokinesis. The left ventricular internal cavity size  was mildly dilated. Left ventricular  diastolic parameters are consistent with Grade I diastolic dysfunction  (impaired relaxation).   2. Right ventricular systolic function is  mildly reduced. The right  ventricular size is normal. There is mildly elevated pulmonary artery  systolic pressure.   3. Left atrial size was moderately dilated.   4. Right atrial size was mildly dilated.   5. The mitral valve is normal in structure. No evidence of mitral valve  regurgitation. No evidence of mitral stenosis.   6. The aortic valve is tricuspid. Aortic valve regurgitation is not  visualized. No aortic stenosis is present.   7. The inferior vena cava is dilated in size with <50% respiratory  variability, suggesting right atrial pressure of 15 mmHg.   8. Technically difficult study with poor acoustic windows  Recent Labs: 06/10/2022: B Natriuretic Peptide 143.0; Hemoglobin 13.3; Platelets 176 08/13/2022: BUN 10; Creatinine, Ser 1.24; Potassium 3.7; Sodium 134  No results found for requested labs within last 365 days.   CrCl cannot be calculated (Patient's most recent lab result is older than the maximum 21 days allowed.).   Wt Readings from Last 3 Encounters:  09/17/22 171 lb (77.6 kg)  08/13/22 209 lb (94.8 kg)  07/07/22 230 lb 3.2 oz (104.4 kg)     Other studies reviewed: Additional studies/records reviewed today include: summarized above  ASSESSMENT AND PLAN:  ICD *** Intact function  *** Programmed as discussed above  CAD *** No anginal sounding symptoms *** On ASA, plavix, *** C/w Dr. Lynne Logan  ICM Chronic CHF *** *** CorVue looks good *** c/w Dr. Lynne Logan    5. VT ***  6. Paroxysmal AFib *** SCAF Follow via his device    Disposition: ***  Current medicines are reviewed at length with the patient today.  The patient did not have any  concerns regarding medicines.  Norma Fredrickson, PA-C 06/09/2023 1:26 PM     [facility] [address] [place] [address] [phone] (office)  [phone] (fax)

## 2023-06-14 ENCOUNTER — Other Ambulatory Visit (HOSPITAL_COMMUNITY): Payer: Self-pay | Admitting: Neurosurgery

## 2023-06-14 DIAGNOSIS — F411 Generalized anxiety disorder: Secondary | ICD-10-CM | POA: Diagnosis not present

## 2023-06-14 DIAGNOSIS — M48062 Spinal stenosis, lumbar region with neurogenic claudication: Secondary | ICD-10-CM

## 2023-06-15 ENCOUNTER — Ambulatory Visit: Payer: BC Managed Care – PPO | Attending: Physician Assistant | Admitting: Physician Assistant

## 2023-06-21 ENCOUNTER — Other Ambulatory Visit: Payer: Self-pay | Admitting: Internal Medicine

## 2023-06-22 ENCOUNTER — Telehealth: Payer: Self-pay | Admitting: Internal Medicine

## 2023-06-22 DIAGNOSIS — R2681 Unsteadiness on feet: Secondary | ICD-10-CM | POA: Diagnosis not present

## 2023-06-22 NOTE — Telephone Encounter (Signed)
Per MyChart Scheduling message:   I'm having pain running down my left arm when I cough very concerned please contact me and let me know whether I should see Doctor Graciela Husbands or should I make contact with Reuel Boom Ben-Simone?

## 2023-06-22 NOTE — Telephone Encounter (Signed)
Attempted to call pt but LVM for the pt to call our office back.

## 2023-06-23 ENCOUNTER — Telehealth: Payer: Self-pay | Admitting: Internal Medicine

## 2023-06-23 NOTE — Telephone Encounter (Signed)
This has been addressed in another encounter please see that encounter for complete details.

## 2023-06-23 NOTE — Telephone Encounter (Signed)
Spoke with pt who complains of pain in his left arm only when he coughs x 3-4 weeks.  No pain at device site.  He describes the pain as being sharp.  He states sometimes when he wakes in the night he feels his arm is swollen.  States he is a back sleeper and denies any tingling, numbness or weakness of left arm.  Pt also denies CP, new SOB or dizziness/fainting.  Pt does not have current BP or HR.  He does have a way to check these and suggested that he regularly check these.  Pt missed his last EP appointment.  Pt advised RN will have scheduler contact him to schedule an appointment for further evaluation.  Reviewed ED precautions.  Pt verbalizes understanding and agrees with current plan.

## 2023-06-23 NOTE — Telephone Encounter (Signed)
Patient is returning phone call in regards to the 06/22/23 patient message. Patient stated he only has the pain in his left arm. Patient stated he feels the pain when he coughs and sometimes when he wakes up from sleeping. Patient reported having sob, but no chest pains. Please advise.

## 2023-06-24 DIAGNOSIS — R2681 Unsteadiness on feet: Secondary | ICD-10-CM | POA: Diagnosis not present

## 2023-06-28 ENCOUNTER — Ambulatory Visit (HOSPITAL_COMMUNITY)
Admission: RE | Admit: 2023-06-28 | Discharge: 2023-06-28 | Disposition: A | Discharge status: Home / Self Care | Payer: BC Managed Care – PPO | Source: Ambulatory Visit | Attending: Neurosurgery | Admitting: Neurosurgery

## 2023-06-28 DIAGNOSIS — M48062 Spinal stenosis, lumbar region with neurogenic claudication: Secondary | ICD-10-CM | POA: Diagnosis not present

## 2023-06-28 DIAGNOSIS — M4319 Spondylolisthesis, multiple sites in spine: Secondary | ICD-10-CM | POA: Diagnosis not present

## 2023-06-28 DIAGNOSIS — M4807 Spinal stenosis, lumbosacral region: Secondary | ICD-10-CM | POA: Diagnosis not present

## 2023-06-28 DIAGNOSIS — M47816 Spondylosis without myelopathy or radiculopathy, lumbar region: Secondary | ICD-10-CM | POA: Diagnosis not present

## 2023-06-28 DIAGNOSIS — M48061 Spinal stenosis, lumbar region without neurogenic claudication: Secondary | ICD-10-CM | POA: Diagnosis not present

## 2023-07-03 ENCOUNTER — Other Ambulatory Visit (HOSPITAL_COMMUNITY): Payer: Self-pay | Admitting: Internal Medicine

## 2023-07-03 ENCOUNTER — Other Ambulatory Visit: Payer: Self-pay | Admitting: Physician Assistant

## 2023-07-03 DIAGNOSIS — I5022 Chronic systolic (congestive) heart failure: Secondary | ICD-10-CM

## 2023-07-04 NOTE — Progress Notes (Deleted)
Cardiology Office Note Date:  07/04/2023  Patient ID:  Sylvestre, Drotar September 27, 1960, MRN [id number] PCP:  Garlan Fillers, MD  Cardiologist:  Dr. Gala Romney Electrophysiologist: Dr. Graciela Husbands    Chief Complaint:  *** annual visit  History of Present Illness: GILAD VANWIE is a 62 y.o. male with history of MAI lung infection (dx in 2012), polycythemia vera (felt 2/2 chronic hypoxia from OSA), CAD (PCI 2015 RCA), ICM, chronic CHF (systolic), spinal stenosis with CBP  Admitted 02/14/21 with AMS and reports of ICD shocks.  He was hypotensive, labs without explanation, ETOH level <10, pt reported having taken something called a "Delta 10" , given naloxone and IVF with improvement > ICU on Narcan gtt.  Though ultimately left AMA ICD was ERI only a day, advised he needed gen change within 3 mo time  He saw Dr. Graciela Husbands 07/21/21. Noted + hx of appropriate tx w/ATP for VT.  Pt had self stopped his ACE, discussed importance of medical therepy, there was some reluctance in gen change procedure by Dr. Graciela Husbands, though ultimately decided to pursue. NO EP f/u since his gen change  Hospitalized 05/04/22, found unresponsive at home, treated with Narcan in the field (note he had just completed 8-weeks of rehab) with improved mental status Placed on narcan gtt in ICU had another episode of lethargy. Pt had reported at home taking methadone, but not sure how much, mentioned he was in a lot of pain. Treated for possible aspiration pneumonia HS Trops elevated, cardiology consulted felt to be type II NSTEMI in setting of drug overdose with no recommended ischemic evaluation. Did require O2 for home Discharged 10/23  Pt has called a couple times with questions regarding his medicines. Missed his EP follow up 10/24  He saw the HF team 06/10/22.  Was doing ok. Denied any CP, palpitations No near syncope or syncope. Occasionally dizzy + DOE Wearing O2 at night Started on Farxiga, other meds continued with  plans to have pharmacy follow up for advancement of GDMT to get him back on sprio and BB and an echo in the future.  I saw him 06/11/22 He is accompanied by his wife, the patient does not drive He comes ambulating with a walker, looks good/sturdy. They report no changes since his visit yesterday except nauseous this AM, vomited once in the driveway. No fever, + nasal congestion. He forgot to ask for refills yesterday and requests that we send them in. Device noted  SCAF March had treated VT in the VF zone (was apparently hospitalized in [facility] at the time) No changes were made  Following with the HF team Last seen 09/17/22 by Dr. Gala Romney, doing well, stopped Ozemic 2/2 nausea, echo with EF 35-40% RV mod reduced  Class II symptoms, coreg increased  Reported no drug use. Reported unable to interrogate his device  + remotes  *** device  *** volume *** VT? *** SCAF ** reported L arm pain with coughing   Device information Abbott dual chamber ICD implanted 08/13/2010 , gen change 08/11/21  + hx of appropriate tx March 2023  Past Medical History:  Diagnosis Date   Anxiety    Automatic implantable cardiac defibrillator [employer]    Analyze ST   Benign prostatic hypertrophy    Benzodiazepine dependence (HCC)    chronic   CAD (coronary artery disease)    Last cath 2/12. 3-v CAD. Failed PCI of distal RCAc  CATH [facility] 4/13 with DES to  LAD X2  CHF (congestive heart failure) (HCC)    Chronic back pain    lumbar stenosis   Chronic systolic heart failure (HCC)    EF 20-25%. s/p ST. Jude ICD   Depression    DJD (degenerative joint disease)    History of testicular cancer    Ischemic cardiomyopathy    Myocardial infarction [facility])    Narcotic dependence (HCC)    chronic   OSA on CPAP    Polycythemia, secondary    S/p hematology consultation for polycythema on 01/06/18.  S/p bone marrow biopsy on 12/31/17 that was negative for myeloproliferative disorder.  Feels secondary to sleep  apnea, active smoking, testosterone supplementation, nocturnal hypoxia   Substance abuse (HCC)    TIA (transient ischemic attack)    Vitamin B12 deficiency 07/22/2016    Past Surgical History:  Procedure Laterality Date   ASD REPAIR, SINUS VENOSUS     CARDIAC DEFIBRILLATOR PLACEMENT  08/2010   HERNIA REPAIR     ICD GENERATOR CHANGEOUT N/A 08/11/2021   Procedure: ICD GENERATOR CHANGEOUT;  Surgeon: Duke Salvia, MD;  Location: [facility] INVASIVE CV LAB;  Service: Cardiovascular;  Laterality: N/A;   LEFT HEART CATHETERIZATION WITH CORONARY ANGIOGRAM N/A 06/14/2014   Procedure: LEFT HEART CATHETERIZATION WITH CORONARY ANGIOGRAM;  Surgeon: Dolores Patty, MD;  Location: [facility] CATH LAB;  Service: Cardiovascular;  Laterality: N/A;   TESTICLE SURGERY     testicular cancer surgery left testicle removed    Current Outpatient Medications  Medication Sig Dispense Refill   AFRIN 12 HOUR 0.05 % nasal spray Place 1 spray into both nostrils 2 (two) times daily as needed for congestion.     aspirin EC 81 MG tablet Take 81 mg by mouth in the morning. Swallow whole.     calcium carbonate (TUMS - DOSED IN MG ELEMENTAL CALCIUM) 500 MG chewable tablet Chew 1-2 tablets by mouth daily as needed for indigestion or heartburn.     carvedilol (COREG) 6.25 MG tablet Take 1 tablet (6.25 mg total) by mouth 2 (two) times daily with a meal. 60 tablet 7   clopidogrel (PLAVIX) 75 MG tablet TAKE ONE TABLET BY MOUTH DAILY 90 tablet 3   dapagliflozin propanediol (FARXIGA) 10 MG TABS tablet Take 1 tablet (10 mg total) by mouth daily before breakfast. 30 tablet 8   lisinopril (ZESTRIL) 10 MG tablet Take 1 tablet (10 mg total) by mouth daily. 90 tablet 3   naloxone (NARCAN) nasal spray 4 mg/0.1 mL Place 1 spray into the nose once as needed (for opioid emergency). 1 each 2   nitroGLYCERIN (NITROSTAT) 0.4 MG SL tablet Place 1 tablet (0.4 mg total) under the tongue every 5 (five) minutes as needed for chest pain. 90 tablet 3    ondansetron (ZOFRAN) 4 MG tablet Take 4 mg by mouth every 8 (eight) hours as needed.     PEPCID COMPLETE 10-800-165 MG chewable tablet Chew 1 tablet by mouth daily as needed (for reflux).     potassium chloride SA (KLOR-CON M) 20 MEQ tablet Take 1 tablet (20 mEq total) by mouth daily. (Patient not taking: Reported on 09/17/2022) 90 tablet 3   Semaglutide,0.25 or 0.5MG /DOS, (OZEMPIC, 0.25 OR 0.5 MG/DOSE,) 2 MG/1.5ML SOPN Inject 0.5 mg into the skin once a week.     sildenafil (VIAGRA) 100 MG tablet Take 50 mg by mouth daily as needed.     spironolactone (ALDACTONE) 25 MG tablet Take 1 tablet (25 mg total) by mouth daily. 30 tablet 3   tadalafil (CIALIS)  20 MG tablet Take 10 mg by mouth daily as needed for erectile dysfunction.     tamsulosin (FLOMAX) 0.4 MG CAPS capsule Take 1 capsule (0.4 mg total) by mouth daily after breakfast. 90 capsule 0   Testosterone Cypionate 200 MG/ML KIT Inject 200 mg into the muscle every 14 (fourteen) days. 2 kit 0   torsemide (DEMADEX) 20 MG tablet Take 1 tablet (20 mg total) by mouth daily. May take an extra 10 mg (1/2 tablet) as needed for additional swelling in legs 45 tablet 3   traZODone (DESYREL) 100 MG tablet Take 100 mg by mouth at bedtime. Can take additional 100 mg if needed     No current facility-administered medications for this visit.    Allergies:   Darvocet [propoxyphene n-acetaminophen], Depakote [divalproex sodium], Haldol [haloperidol], and Propoxyphene   Social History:  The patient  reports that he has been smoking cigarettes and e-cigarettes. He started smoking about 37 years ago. He has a 14.5 pack-year smoking history. He has never used smokeless tobacco. He reports current drug use. Drug: Marijuana. He reports that he does not drink alcohol.   Family History:  The patient's family history includes Cancer in his mother; Heart disease (age of onset: 33) in his father.  ROS:  Please see the history of present illness.    All other systems are  reviewed and otherwise negative.   PHYSICAL EXAM:  VS:  There were no vitals taken for this visit. BMI: There is no height or weight on file to calculate BMI. Well nourished, well developed, in no acute distress, chronically ill appearing, looks older then his age HEENT: normocephalic, atraumatic Neck: no JVD, carotid bruits or masses Cardiac:  *** RRR; no significant murmurs, no rubs, or gallops Lungs:  ***, rhonchi or rales Abd: soft, nontender MS: no deformity or atrophy Ext: *** 1+ edema b/l Skin: warm and dry, no rash Neuro:  No gross deficits appreciated Psych: euthymic mood, full affect  *** ICD site is stable, no tethering or discomfort   EKG:  not done today ***  Device interrogation done today and reviewed by myself:  *** Battery and lead measurements are good ***    09/17/22: TTE 1. Left ventricular ejection fraction, by estimation, is 35 to 40%. The  left ventricle has moderately decreased function. The left ventricle  demonstrates regional wall motion abnormalities (see scoring  diagram/findings for description). Left ventricular   diastolic parameters are consistent with Grade I diastolic dysfunction  (impaired relaxation). There is hypokinesis of the left ventricular,  entire inferior wall and inferolateral wall.   2. Right ventricular systolic function is mildly reduced. The right  ventricular size is normal. There is normal pulmonary artery systolic  pressure.   3. Left atrial size was mildly dilated.   4. The mitral valve is normal in structure. Trivial mitral valve  regurgitation. No evidence of mitral stenosis.   5. The aortic valve is tricuspid. Aortic valve regurgitation is not  visualized. No aortic stenosis is present.   6. The inferior vena cava is normal in size with greater than 50%  respiratory variability, suggesting right atrial pressure of 3 mmHg.    Echo 05/04/2022 IMPRESSIONS  1. Left ventricular ejection fraction, by estimation, is 25  to 30%. The  left ventricle has severely decreased function. The left ventricle  demonstrates global hypokinesis. The left ventricular internal cavity size  was mildly dilated. Left ventricular  diastolic parameters are consistent with Grade I diastolic dysfunction  (impaired relaxation).  2. Right ventricular systolic function is mildly reduced. The right  ventricular size is normal. There is mildly elevated pulmonary artery  systolic pressure.   3. Left atrial size was moderately dilated.   4. Right atrial size was mildly dilated.   5. The mitral valve is normal in structure. No evidence of mitral valve  regurgitation. No evidence of mitral stenosis.   6. The aortic valve is tricuspid. Aortic valve regurgitation is not  visualized. No aortic stenosis is present.   7. The inferior vena cava is dilated in size with <50% respiratory  variability, suggesting right atrial pressure of 15 mmHg.   8. Technically difficult study with poor acoustic windows  Recent Labs: 08/13/2022: BUN 10; Creatinine, Ser 1.24; Potassium 3.7; Sodium 134  No results found for requested labs within last 365 days.   CrCl cannot be calculated (Patient's most recent lab result is older than the maximum 21 days allowed.).   Wt Readings from Last 3 Encounters:  09/17/22 171 lb (77.6 kg)  08/13/22 209 lb (94.8 kg)  07/07/22 230 lb 3.2 oz (104.4 kg)     Other studies reviewed: Additional studies/records reviewed today include: summarized above  ASSESSMENT AND PLAN:  ICD *** Intact function  *** Programmed as discussed above  CAD *** No anginal sounding symptoms *** On ASA, plavix, *** C/w Dr. Lynne Logan  ICM Chronic CHF *** *** CorVue looks good *** c/w Dr. Lynne Logan    5. VT ***  6. Paroxysmal AFib *** SCAF Follow via his device    Disposition: ***  Current medicines are reviewed at length with the patient today.  The patient did not have any concerns regarding  medicines.  Norma Fredrickson, PA-C 07/04/2023 8:48 AM     [facility] [address] [place] [address] [phone] (office)  [phone] (fax)

## 2023-07-05 ENCOUNTER — Encounter: Payer: BC Managed Care – PPO | Admitting: Physician Assistant

## 2023-07-05 DIAGNOSIS — R2681 Unsteadiness on feet: Secondary | ICD-10-CM | POA: Diagnosis not present

## 2023-07-05 NOTE — Telephone Encounter (Signed)
This is a CHF pt 

## 2023-07-06 DIAGNOSIS — F411 Generalized anxiety disorder: Secondary | ICD-10-CM | POA: Diagnosis not present

## 2023-07-07 NOTE — Progress Notes (Unsigned)
Electrophysiology Office Note:   Date:  07/08/2023  ID:  FRANCESCO MILLEDGE, DOB 05/07/1961, MRN [id number]  Primary Cardiologist: None Primary Heart Failure: Arvilla Meres, MD Electrophysiologist: Sherryl Manges, MD       History of Present Illness:   [name] is a 62 y.o. male with h/o VT, ICM/sCHF s/p ICD, CAD s/p PCI 2015 to RCA, OSA, MAI, chronic hypoxic respiratory failure, depression / anxiety, spinal stenosis, opoid use/abuse with overdose admissions seen today for acute visit due to left arm pain when coughing.    Patient reports he continues to smoke. He recently completed a round of antibiotics for a respiratory infection. He has ongoing cough and pain in his left ribs / underarm.  No issues from anterior chest / device site.   Pt reports he is supposed to have a back surgery at some point.  This is not scheduled that I can see but he did see Dr. Maisie Fus in early November for evaluation.   He denies palpitations, dyspnea, PND, orthopnea, nausea, vomiting, dizziness, syncope, edema, weight gain, or early satiety.   Review of systems complete and found to be negative unless listed in HPI.   EP Information / Studies Reviewed:    EKG is ordered today. Personal review as below.  EKG Interpretation Date/Time:  Thursday July 08 2023 09:43:41 EST Ventricular Rate:  88 PR Interval:  156 QRS Duration:  126 QT Interval:  404 QTC Calculation: 488 R Axis:   73  Text Interpretation: Normal sinus rhythm Non-specific intra-ventricular conduction block Confirmed by Canary Brim ([id number]) on 07/08/2023 9:49:09 AM   ICD Interrogation-  reviewed in detail today,  See PACEART report.  Device History: Abbott Dual Chamber ICD implanted 08/13/10 for sCHF. Generator change 08/11/21 History of appropriate therapy: Yes History of AAD therapy:  none known to date     Studies:  ECHO 09/2022 > LVEF 35-40%, G1DD   Arrhythmia / AAD Paroxysmal AF / SCAF VT    Risk Assessment/Calculations:     CHA2DS2-VASc Score =     This indicates a  % annual risk of stroke. The patient's score is based upon:               Physical Exam:   VS:  BP (!) 114/54   Pulse 88   Ht 5\' 11"  (1.803 m)   Wt 195 lb (88.5 kg)   SpO2 92%   BMI 27.20 kg/m    Wt Readings from Last 3 Encounters:  07/08/23 195 lb (88.5 kg)  09/17/22 171 lb (77.6 kg)  08/13/22 209 lb (94.8 kg)     GEN: chronically ill appearing sitting in wheelchair in no acute distress NECK: No JVD; No carotid bruits CARDIAC: Regular rate and rhythm, no murmurs, rubs, gallops. Device site wnl, no edema/erythema or tethering RESPIRATORY:  Clear to auscultation without rales, wheezing or rhonchi  ABDOMEN: Soft, non-tender, non-distended EXTREMITIES:  No edema; No deformity   ASSESSMENT AND PLAN:    Left Arm Pain with Cough  -EGM NSR  -recently completed abx for respiratory infection  -has pain in his ribs/underarm on the left when he coughs > device check wnl, no evidence of noise or indication that ICD would be contributing, suspect driven by ongoing significant tobacco abuse / pulmonary infection and cough mechanics / pleuritic or costochondritis -referred patient to see his PCP for repeat CXR   VT / Chronic Systolic Dysfunction s/p Abbott dual chamber ICD  ICM -euvolemic today / CorVue stable  -Stable on  an appropriate medical regimen -Normal ICD function -See Pace Art report -No changes today -GDMT per Advanced HF Team > pt needs follow up. Never took prescribed Farxiga.    CAD -no anginal symptoms  -ASA, plavix  -no BB per HF Team -statin per Cardiology  SCAF / AFL -<1% burden via device  -monitor via device   Tobacco Abuse  -smoking cessation encouraged   Lumbar Stenosis  -pending work up per Dr. Maisie Fus  -no date planned  -reviewed with patient that Dr. Maisie Fus can let our office know when he is scheduled to request surgical clearance   Disposition:   Follow up with Dr. Graciela Husbands in 12  months   Signed, Canary Brim, MSN, APRN, NP-C, AGACNP-BC [facility] - Electrophysiology  07/08/2023, 12:34 PM

## 2023-07-08 ENCOUNTER — Ambulatory Visit: Payer: BC Managed Care – PPO | Attending: Pulmonary Disease | Admitting: Pulmonary Disease

## 2023-07-08 ENCOUNTER — Ambulatory Visit: Payer: BC Managed Care – PPO | Admitting: Pulmonary Disease

## 2023-07-08 ENCOUNTER — Encounter: Payer: Self-pay | Admitting: Pulmonary Disease

## 2023-07-08 VITALS — BP 114/54 | HR 88 | Ht 71.0 in | Wt 195.0 lb

## 2023-07-08 DIAGNOSIS — I480 Paroxysmal atrial fibrillation: Secondary | ICD-10-CM | POA: Diagnosis not present

## 2023-07-08 DIAGNOSIS — I472 Ventricular tachycardia, unspecified: Secondary | ICD-10-CM | POA: Diagnosis not present

## 2023-07-08 DIAGNOSIS — I255 Ischemic cardiomyopathy: Secondary | ICD-10-CM | POA: Diagnosis not present

## 2023-07-08 DIAGNOSIS — I5022 Chronic systolic (congestive) heart failure: Secondary | ICD-10-CM

## 2023-07-08 LAB — CUP PACEART INCLINIC DEVICE CHECK
Battery Remaining Longevity: 96 mo
Brady Statistic RA Percent Paced: 1.5 %
Brady Statistic RV Percent Paced: 0.09 %
Date Time Interrogation Session: 20241226130308
HighPow Impedance: 66.375
Implantable Lead Connection Status: 753985
Implantable Lead Connection Status: 753985
Implantable Lead Implant Date: 20120201
Implantable Lead Implant Date: 20120201
Implantable Lead Location: 753859
Implantable Lead Location: 753860
Implantable Pulse Generator Implant Date: 20230131
Lead Channel Impedance Value: 387.5 Ohm
Lead Channel Impedance Value: 437.5 Ohm
Lead Channel Pacing Threshold Amplitude: 0.75 V
Lead Channel Pacing Threshold Amplitude: 0.75 V
Lead Channel Pacing Threshold Amplitude: 1.5 V
Lead Channel Pacing Threshold Amplitude: 1.5 V
Lead Channel Pacing Threshold Pulse Width: 0.5 ms
Lead Channel Pacing Threshold Pulse Width: 0.5 ms
Lead Channel Pacing Threshold Pulse Width: 0.7 ms
Lead Channel Pacing Threshold Pulse Width: 0.7 ms
Lead Channel Sensing Intrinsic Amplitude: 11.8 mV
Lead Channel Sensing Intrinsic Amplitude: 4.4 mV
Lead Channel Setting Pacing Amplitude: 2 V
Lead Channel Setting Pacing Amplitude: 3 V
Lead Channel Setting Pacing Pulse Width: 0.7 ms
Lead Channel Setting Sensing Sensitivity: 0.5 mV
Pulse Gen Serial Number: 111050201
Zone Setting Status: 755011

## 2023-07-08 NOTE — Patient Instructions (Addendum)
Medication Instructions:  Your physician recommends that you continue on your current medications as directed. Please refer to the Current Medication list given to you today.  *If you need a refill on your cardiac medications before your next appointment, please call your pharmacy*   Lab Work: None.  If you have labs (blood work) drawn today and your tests are completely normal, you will receive your results only by: MyChart Message (if you have MyChart) OR A paper copy in the mail If you have any lab test that is abnormal or we need to change your treatment, we will call you to review the results.   Testing/Procedures: None.   Follow-Up:  Your next appointment:   1 year(s)    Other Instructions Please contact your PCP and request an appointment as soon as possible to have them check on your cough and upper respiratory symptoms.   You have also been referred back to the heart failure clinic to discuss restarting farxiga.

## 2023-07-13 DIAGNOSIS — R058 Other specified cough: Secondary | ICD-10-CM | POA: Diagnosis not present

## 2023-07-13 DIAGNOSIS — J209 Acute bronchitis, unspecified: Secondary | ICD-10-CM | POA: Diagnosis not present

## 2023-07-20 DIAGNOSIS — R2681 Unsteadiness on feet: Secondary | ICD-10-CM | POA: Diagnosis not present

## 2023-07-22 DIAGNOSIS — R2681 Unsteadiness on feet: Secondary | ICD-10-CM | POA: Diagnosis not present

## 2023-07-27 DIAGNOSIS — R2681 Unsteadiness on feet: Secondary | ICD-10-CM | POA: Diagnosis not present

## 2023-07-29 DIAGNOSIS — R2681 Unsteadiness on feet: Secondary | ICD-10-CM | POA: Diagnosis not present

## 2023-08-02 DIAGNOSIS — F411 Generalized anxiety disorder: Secondary | ICD-10-CM | POA: Diagnosis not present

## 2023-08-03 DIAGNOSIS — R2681 Unsteadiness on feet: Secondary | ICD-10-CM | POA: Diagnosis not present

## 2023-08-05 DIAGNOSIS — J9611 Chronic respiratory failure with hypoxia: Secondary | ICD-10-CM | POA: Diagnosis not present

## 2023-08-05 DIAGNOSIS — J449 Chronic obstructive pulmonary disease, unspecified: Secondary | ICD-10-CM | POA: Diagnosis not present

## 2023-08-06 ENCOUNTER — Telehealth: Payer: Self-pay

## 2023-08-06 DIAGNOSIS — Z6826 Body mass index (BMI) 26.0-26.9, adult: Secondary | ICD-10-CM | POA: Diagnosis not present

## 2023-08-06 DIAGNOSIS — M48062 Spinal stenosis, lumbar region with neurogenic claudication: Secondary | ICD-10-CM | POA: Diagnosis not present

## 2023-08-06 NOTE — Telephone Encounter (Signed)
Good Morning  Brandi  We have received a surgical clearance request for [name] for lumbar fusion surgery.  They were seen recently in clinic on 07/08/2023. Can you please comment on surgical clearance for upcoming lumbar procedure. Please forward you guidance and recommendations to P CV DIV PREOP  Thanks, Robin Searing, NP

## 2023-08-06 NOTE — Telephone Encounter (Signed)
   Pre-operative Risk Assessment    Patient Name: [name]  DOB: August 10, 1960 MRN: [id number]   Date of last office visit: 07/08/23 Date of next office visit: n/a   Request for Surgical Clearance    Procedure:   L2-L3, L3-L4 DLIF left prone transpsoas, L4-L5, L5-S1 TLIF- posterior lateral and interbody fusion   Date of Surgery:  Clearance TBD                                 Surgeon:  Coy Saunas. Maisie Fus, MD  Surgeon's Group or Practice Name:  [facility]  Phone number:  [phone] Fax number:  [phone]   Type of Clearance Requested:   - Medical  - Pharmacy:  Hold Aspirin and Clopidogrel (Plavix) Not indicated    Type of Anesthesia:  General    Additional requests/questions:    [name]   08/06/2023, 11:44 AM

## 2023-08-09 DIAGNOSIS — F411 Generalized anxiety disorder: Secondary | ICD-10-CM | POA: Diagnosis not present

## 2023-08-10 DIAGNOSIS — F4322 Adjustment disorder with anxiety: Secondary | ICD-10-CM | POA: Diagnosis not present

## 2023-08-10 DIAGNOSIS — R2681 Unsteadiness on feet: Secondary | ICD-10-CM | POA: Diagnosis not present

## 2023-08-11 ENCOUNTER — Ambulatory Visit (INDEPENDENT_AMBULATORY_CARE_PROVIDER_SITE_OTHER): Payer: BC Managed Care – PPO

## 2023-08-11 DIAGNOSIS — I255 Ischemic cardiomyopathy: Secondary | ICD-10-CM | POA: Diagnosis not present

## 2023-08-11 LAB — CUP PACEART REMOTE DEVICE CHECK
Battery Remaining Longevity: 85 mo
Battery Remaining Percentage: 81 %
Battery Voltage: 3.01 V
Brady Statistic AP VP Percent: 1 %
Brady Statistic AP VS Percent: 2.5 %
Brady Statistic AS VP Percent: 1 %
Brady Statistic AS VS Percent: 95 %
Brady Statistic RA Percent Paced: 1.5 %
Brady Statistic RV Percent Paced: 1 %
Date Time Interrogation Session: 20250129010208
HighPow Impedance: 63 Ohm
Implantable Lead Connection Status: 753985
Implantable Lead Connection Status: 753985
Implantable Lead Implant Date: 20120201
Implantable Lead Implant Date: 20120201
Implantable Lead Location: 753859
Implantable Lead Location: 753860
Implantable Pulse Generator Implant Date: 20230131
Lead Channel Impedance Value: 400 Ohm
Lead Channel Impedance Value: 460 Ohm
Lead Channel Pacing Threshold Amplitude: 0.75 V
Lead Channel Pacing Threshold Amplitude: 1.5 V
Lead Channel Pacing Threshold Pulse Width: 0.5 ms
Lead Channel Pacing Threshold Pulse Width: 0.7 ms
Lead Channel Sensing Intrinsic Amplitude: 12 mV
Lead Channel Sensing Intrinsic Amplitude: 4.6 mV
Lead Channel Setting Pacing Amplitude: 2 V
Lead Channel Setting Pacing Amplitude: 3 V
Lead Channel Setting Pacing Pulse Width: 0.7 ms
Lead Channel Setting Sensing Sensitivity: 0.5 mV
Pulse Gen Serial Number: 111050201
Zone Setting Status: 755011

## 2023-08-12 DIAGNOSIS — R2681 Unsteadiness on feet: Secondary | ICD-10-CM | POA: Diagnosis not present

## 2023-08-13 ENCOUNTER — Other Ambulatory Visit (HOSPITAL_COMMUNITY): Payer: Self-pay | Admitting: Internal Medicine

## 2023-08-13 NOTE — Telephone Encounter (Signed)
[name] was recently seen in clinic by you 07/08/2023.  Please advise on cardiac risk for upcoming procedure (  L2-L3, L3-L4 DLIF left prone transpsoas, L4-L5, L5-S1 TLIF- posterior lateral and interbody fusion).  Thank you for your help.  Please direct your response to CV DIV preop pool.  Thomasene Ripple. Azzam Mehra NP-C     08/13/2023, 12:07 PM [facility] [address] Office [phone] Fax [phone]

## 2023-08-16 DIAGNOSIS — F411 Generalized anxiety disorder: Secondary | ICD-10-CM | POA: Diagnosis not present

## 2023-08-16 NOTE — Telephone Encounter (Signed)
Preoperative team, I reached out to Mickel Baas who indicated that patient would need a general cardiology in person office appointment to better evaluate cardiac risk.  Please schedule an office appointment for him.  Thank you for your help.  Thomasene Ripple. Scottlyn Mchaney NP-C     08/16/2023, 1:46 PM [facility] [address] Office [phone] Fax [phone]

## 2023-08-16 NOTE — Telephone Encounter (Signed)
Pt has been scheduled to see Canary Brim, NP, 08/24/23, clearance will be addressed at that time.  Will route to the requesting surgeon's office to make them aware.

## 2023-08-18 DIAGNOSIS — R2681 Unsteadiness on feet: Secondary | ICD-10-CM | POA: Diagnosis not present

## 2023-08-20 ENCOUNTER — Other Ambulatory Visit (HOSPITAL_COMMUNITY): Payer: Self-pay | Admitting: Internal Medicine

## 2023-08-20 DIAGNOSIS — F411 Generalized anxiety disorder: Secondary | ICD-10-CM | POA: Diagnosis not present

## 2023-08-23 DIAGNOSIS — F411 Generalized anxiety disorder: Secondary | ICD-10-CM | POA: Diagnosis not present

## 2023-08-23 NOTE — Progress Notes (Signed)
 This encounter was created in error - please disregard.

## 2023-08-24 ENCOUNTER — Ambulatory Visit: Payer: BC Managed Care – PPO | Admitting: Pulmonary Disease

## 2023-08-24 DIAGNOSIS — I255 Ischemic cardiomyopathy: Secondary | ICD-10-CM

## 2023-08-24 DIAGNOSIS — I5022 Chronic systolic (congestive) heart failure: Secondary | ICD-10-CM

## 2023-08-24 DIAGNOSIS — R2681 Unsteadiness on feet: Secondary | ICD-10-CM | POA: Diagnosis not present

## 2023-08-24 DIAGNOSIS — I472 Ventricular tachycardia, unspecified: Secondary | ICD-10-CM

## 2023-08-24 DIAGNOSIS — Z01818 Encounter for other preprocedural examination: Secondary | ICD-10-CM

## 2023-08-31 DIAGNOSIS — F411 Generalized anxiety disorder: Secondary | ICD-10-CM | POA: Diagnosis not present

## 2023-09-05 DIAGNOSIS — J9611 Chronic respiratory failure with hypoxia: Secondary | ICD-10-CM | POA: Diagnosis not present

## 2023-09-05 DIAGNOSIS — J449 Chronic obstructive pulmonary disease, unspecified: Secondary | ICD-10-CM | POA: Diagnosis not present

## 2023-09-07 DIAGNOSIS — R2681 Unsteadiness on feet: Secondary | ICD-10-CM | POA: Diagnosis not present

## 2023-09-08 DIAGNOSIS — R2681 Unsteadiness on feet: Secondary | ICD-10-CM | POA: Diagnosis not present

## 2023-09-13 ENCOUNTER — Encounter: Payer: Self-pay | Admitting: Internal Medicine

## 2023-09-13 DIAGNOSIS — F411 Generalized anxiety disorder: Secondary | ICD-10-CM | POA: Diagnosis not present

## 2023-09-14 ENCOUNTER — Ambulatory Visit (HOSPITAL_COMMUNITY)
Admission: RE | Admit: 2023-09-14 | Discharge: 2023-09-14 | Disposition: A | Discharge status: Home / Self Care | Payer: BC Managed Care – PPO | Source: Ambulatory Visit | Attending: Internal Medicine | Admitting: Internal Medicine

## 2023-09-14 ENCOUNTER — Encounter (HOSPITAL_COMMUNITY): Payer: Self-pay | Admitting: Internal Medicine

## 2023-09-14 ENCOUNTER — Telehealth: Payer: Self-pay

## 2023-09-14 VITALS — BP 104/68 | HR 77 | Wt 193.2 lb

## 2023-09-14 DIAGNOSIS — Z01818 Encounter for other preprocedural examination: Secondary | ICD-10-CM

## 2023-09-14 DIAGNOSIS — I472 Ventricular tachycardia, unspecified: Secondary | ICD-10-CM | POA: Diagnosis not present

## 2023-09-14 DIAGNOSIS — M5459 Other low back pain: Secondary | ICD-10-CM | POA: Diagnosis not present

## 2023-09-14 DIAGNOSIS — G4733 Obstructive sleep apnea (adult) (pediatric): Secondary | ICD-10-CM | POA: Diagnosis not present

## 2023-09-14 DIAGNOSIS — F32A Depression, unspecified: Secondary | ICD-10-CM | POA: Insufficient documentation

## 2023-09-14 DIAGNOSIS — G8929 Other chronic pain: Secondary | ICD-10-CM | POA: Diagnosis not present

## 2023-09-14 DIAGNOSIS — E669 Obesity, unspecified: Secondary | ICD-10-CM | POA: Insufficient documentation

## 2023-09-14 DIAGNOSIS — I5042 Chronic combined systolic (congestive) and diastolic (congestive) heart failure: Secondary | ICD-10-CM

## 2023-09-14 DIAGNOSIS — F17290 Nicotine dependence, other tobacco product, uncomplicated: Secondary | ICD-10-CM | POA: Diagnosis not present

## 2023-09-14 DIAGNOSIS — R9431 Abnormal electrocardiogram [ECG] [EKG]: Secondary | ICD-10-CM | POA: Insufficient documentation

## 2023-09-14 DIAGNOSIS — Z7901 Long term (current) use of anticoagulants: Secondary | ICD-10-CM | POA: Insufficient documentation

## 2023-09-14 DIAGNOSIS — D45 Polycythemia vera: Secondary | ICD-10-CM | POA: Insufficient documentation

## 2023-09-14 DIAGNOSIS — I2510 Atherosclerotic heart disease of native coronary artery without angina pectoris: Secondary | ICD-10-CM | POA: Insufficient documentation

## 2023-09-14 DIAGNOSIS — J9610 Chronic respiratory failure, unspecified whether with hypoxia or hypercapnia: Secondary | ICD-10-CM | POA: Diagnosis not present

## 2023-09-14 DIAGNOSIS — I5022 Chronic systolic (congestive) heart failure: Secondary | ICD-10-CM | POA: Insufficient documentation

## 2023-09-14 DIAGNOSIS — I255 Ischemic cardiomyopathy: Secondary | ICD-10-CM | POA: Insufficient documentation

## 2023-09-14 DIAGNOSIS — I252 Old myocardial infarction: Secondary | ICD-10-CM | POA: Diagnosis not present

## 2023-09-14 DIAGNOSIS — Z01810 Encounter for preprocedural cardiovascular examination: Secondary | ICD-10-CM | POA: Insufficient documentation

## 2023-09-14 DIAGNOSIS — Z720 Tobacco use: Secondary | ICD-10-CM | POA: Diagnosis not present

## 2023-09-14 DIAGNOSIS — M48061 Spinal stenosis, lumbar region without neurogenic claudication: Secondary | ICD-10-CM | POA: Insufficient documentation

## 2023-09-14 DIAGNOSIS — Z6826 Body mass index (BMI) 26.0-26.9, adult: Secondary | ICD-10-CM | POA: Diagnosis not present

## 2023-09-14 DIAGNOSIS — Z955 Presence of coronary angioplasty implant and graft: Secondary | ICD-10-CM | POA: Insufficient documentation

## 2023-09-14 NOTE — Progress Notes (Signed)
 Advanced Heart Failure Clinic Note  Primary Care: Dr Jarold Motto HF Cardiologist: Dr. Gala Romney  HPI: [name] is a 63 y.o. 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 Jude ICD implantation. Was enrolled in [employer].    Has been followed recently by Dr. Aundria Rud at [facility]. Underwent cath at [facility] in 3/15 and had 2.5x76mm Xience DES placed in LAD. Had cath 12/15 here with stable CAD. Repeat cath set up in 10/18 but he cancelled.    Echo (8/19): EF 30-35% (stable) mild RV dilation.  Echo 07/18/19 EF 40-45% mild MR   Lost to follow up.  Admitted 10/23 with overdose and NSTEMI.  Had recent 8-week stay at rehab, returned home and found unresponsive. Given narcan in field. Required ICU monitoring and Narcan gtt. Treated for aspiration PNA and AKI. HS Trops elevated, cardiology consulted felt to be type II NSTEMI in setting of drug overdose with no recommended ischemic evaluation. Echo showed EF 25-30%. LHC deferred as reduced EF felt to be related to overdose. General cardiology consulted and GDMT titrated, but limited by AKI. Patient left AMA, weight 235 lbs.  Echo 09/17/22  EF 35-40% RV mod reduced  Here for pre-op cardiac evaluation. Struggling with severe back pain. Has seen Dr. Maisie Fus in [facility]. Back MRI with severe Severe and generalized lumbar spine degeneration with scoliosis, multilevel listhesis, and discogenic edema + spinal stenosis. He is weak. Gets around with Rolator. Rare chest pain. Smokes 1ppd.    Cardiac Studies - Echo (10/23): EF 25-30%, grade I DD, RV mildly reduced  - Echo (1/5/2):1 EF 40-45% mild MR  - Echo (2019): EF 30-35%  - Cath at [facility] (12/15): stable CAD  - Cath at [facility] (3/15): 2.5x26mm Xience DES placed in LAD.   Past Medical History:  Diagnosis Date    Anxiety    Automatic implantable cardiac defibrillator St Judes    Analyze ST   Benign prostatic hypertrophy    Benzodiazepine dependence (HCC)    chronic   CAD (coronary artery disease)    Last cath 2/12. 3-v CAD. Failed PCI of distal RCAc  CATH [facility] 4/13 with DES to  LAD X2   CHF (congestive heart failure) (HCC)    Chronic back pain    lumbar stenosis   Chronic systolic heart failure (HCC)    EF 20-25%. s/p ST. Jude ICD   Depression    DJD (degenerative joint disease)    History of testicular cancer    Ischemic cardiomyopathy    Myocardial infarction [facility])    Narcotic dependence (HCC)    chronic   OSA on CPAP    Polycythemia, secondary    S/p hematology consultation for polycythema on 01/06/18.  S/p bone marrow biopsy on 12/31/17 that was negative for myeloproliferative disorder.  Feels secondary to sleep apnea, active smoking, testosterone supplementation, nocturnal hypoxia   Substance abuse (HCC)    TIA (transient ischemic attack)    Vitamin B12 deficiency 07/22/2016    Current Outpatient Medications  Medication Sig Dispense Refill   acetaminophen (TYLENOL) 325 MG tablet      AFRIN 12 HOUR 0.05 % nasal spray Place 1 spray into both nostrils 2 (two) times daily as needed for congestion.     ANORO ELLIPTA 62.5-25 MCG/ACT AEPB Inhale 1 puff into the lungs  daily.     calcium carbonate (TUMS - DOSED IN MG ELEMENTAL CALCIUM) 500 MG chewable tablet Chew 1-2 tablets by mouth daily as needed for indigestion or heartburn.     carvedilol (COREG) 6.25 MG tablet Take 1 tablet (6.25 mg total) by mouth 2 (two) times daily with a meal. 60 tablet 7   clopidogrel (PLAVIX) 75 MG tablet TAKE ONE TABLET BY MOUTH DAILY 90 tablet 3   dapagliflozin propanediol (FARXIGA) 10 MG TABS tablet Take 1 tablet (10 mg total) by mouth daily before breakfast. 30 tablet 5   famotidine (PEPCID) 20 MG tablet      gabapentin (NEURONTIN) 300 MG capsule Take 300 mg by mouth 3 (three) times daily.     lisinopril  (ZESTRIL) 10 MG tablet Take 1 tablet (10 mg total) by mouth daily. 90 tablet 3   LORazepam (ATIVAN) 2 MG tablet Take 2 mg by mouth daily. 1 mg in the evening     naloxone (NARCAN) nasal spray 4 mg/0.1 mL Place 1 spray into the nose once as needed (for opioid emergency). 1 each 2   nitroGLYCERIN (NITROSTAT) 0.4 MG SL tablet Place 1 tablet (0.4 mg total) under the tongue every 5 (five) minutes as needed for chest pain. 90 tablet 3   ondansetron (ZOFRAN) 4 MG tablet Take 4 mg by mouth every 8 (eight) hours as needed.     pantoprazole (PROTONIX) 40 MG tablet Take by mouth.     PEPCID COMPLETE 10-800-165 MG chewable tablet Chew 1 tablet by mouth daily as needed (for reflux).     sildenafil (VIAGRA) 100 MG tablet Take 50 mg by mouth daily as needed.     SOMA 250 MG tablet      spironolactone (ALDACTONE) 25 MG tablet Take 1 tablet (25 mg total) by mouth daily. 30 tablet 3   tadalafil (CIALIS) 20 MG tablet Take 10 mg by mouth daily as needed for erectile dysfunction.     tamsulosin (FLOMAX) 0.4 MG CAPS capsule Take 1 capsule (0.4 mg total) by mouth daily after breakfast. 90 capsule 0   Testosterone Cypionate 200 MG/ML KIT Inject 200 mg into the muscle every 14 (fourteen) days. 2 kit 0   torsemide (DEMADEX) 20 MG tablet Take 1 tablet (20 mg total) by mouth daily. May take an extra 10 mg (1/2 tablet) as needed for additional swelling in legs 45 tablet 3   traZODone (DESYREL) 100 MG tablet Take 200 mg by mouth at bedtime.     aspirin EC 81 MG tablet Take 81 mg by mouth in the morning. Swallow whole. (Patient not taking: Reported on 07/08/2023)     ondansetron (ZOFRAN-ODT) 4 MG disintegrating tablet  (Patient not taking: Reported on 09/14/2023)     potassium chloride SA (KLOR-CON M) 20 MEQ tablet Take 1 tablet (20 mEq total) by mouth daily. (Patient not taking: Reported on 09/14/2023) 90 tablet 3   No current facility-administered medications for this encounter.    Allergies  Allergen Reactions   Darvocet  [Propoxyphene N-Acetaminophen] Anaphylaxis    Throat closes   Depakote [Divalproex Sodium] Other (See Comments)    Coded- ended up on a vent   Haldol [Haloperidol] Other (See Comments)    Coded- ended up on a vent   Propoxyphene Anaphylaxis and Swelling      Social History   Socioeconomic History   Marital status: Divorced    Spouse name: Not on file   Number of children: 4   Years of education: Not on  file   Highest education level: Not on file  Occupational History   Not on file  Tobacco Use   Smoking status: Every Day    Current packs/day: 0.00    Average packs/day: 0.5 packs/day for 29.0 years (14.5 ttl pk-yrs)    Types: Cigarettes, E-cigarettes    Start date: 07/13/1986    Last attempt to quit: 07/14/2015    Years since quitting: 8.1   Smokeless tobacco: Never   Tobacco comments:    no cigarette in 4 days but yes to e-cig  Vaping Use   Vaping status: Every Day   Start date: 07/14/2015  Substance and Sexual Activity   Alcohol use: No    Alcohol/week: 0.0 standard drinks of alcohol   Drug use: Yes    Types: Marijuana    Comment: Urine showed THC   Sexual activity: Yes    Partners: Male  Other Topics Concern   Not on file  Social History Narrative   Marital status: married x 12 years; wife is severe alcoholic.      Children: 3 married daughters (23, 5, 30); 1 son (10yo); 3 grandchildren born in 2017      Lives: with wife, son      Left-handed   Caffeine: 3 drinks per day   Social Drivers of Health   Financial Resource Strain: Not on file  Food Insecurity: No Food Insecurity (05/04/2022)   Hunger Vital Sign    Worried About Running Out of Food in the Last Year: Never true    Ran Out of Food in the Last Year: Never true  Transportation Needs: No Transportation Needs (05/04/2022)   PRAPARE - Administrator, Civil Service (Medical): No    Lack of Transportation (Non-Medical): No  Physical Activity: Not on file  Stress: Not on file  Social  Connections: Not on file  Intimate Partner Violence: Not At Risk (05/04/2022)   Humiliation, Afraid, Rape, and Kick questionnaire    Fear of Current or Ex-Partner: No    Emotionally Abused: No    Physically Abused: No    Sexually Abused: No   Family History  Problem Relation Age of Onset   Heart disease Father 26       Died of MI   Cancer Mother    BP 104/68   Pulse 77   Wt 87.6 kg (193 lb 3.2 oz)   SpO2 95%   BMI 26.95 kg/m   Wt Readings from Last 3 Encounters:  09/14/23 87.6 kg (193 lb 3.2 oz)  07/08/23 88.5 kg (195 lb)  09/17/22 77.6 kg (171 lb)   PHYSICAL EXAM: General:  Weak appearing. No resp difficulty HEENT: normal Neck: supple. no JVD. Carotids 2+ bilat; no bruits. No lymphadenopathy or thryomegaly appreciated. Cor: PMI nondisplaced. Regular rate & rhythm. No rubs, gallops or murmurs. Lungs: decreased throughout Abdomen: soft, nontender, nondistended. No hepatosplenomegaly. No bruits or masses. Good bowel sounds. Extremities: no cyanosis, clubbing, rash, edema L>R LE atrophy Neuro: alert & orientedx3, cranial nerves grossly intact. moves all 4 extremities w/o difficulty. Affect pleasant   ASSESSMENT & PLAN:  1. Pre-operative CV clearance for back surgery - overall I think he is moderate to high risk for peri-op CV complications but I do not think the risk is prohibitive - we had long talk about the alternative about not proceeding with surgery including progressive debility and eventually being bedbound - I suggested he seriously consider proceeding with surgery - In the interim we will update his  echo and get PET perfusion scan as his recent functional abilities have been poor - If echo and stress are not high risk, we will clear him - strongly suggested he stop smoking pre-oepratively   1. Chronic systolic HF/iCM - Echo (8/19): EF 30-35%.  - S/p STJ ICD - Echo (07/18/19): EF 40-45% - Echo (10/23): EF 25-30%, grade I DD, RV mildly reduced, felt to be newly  reduced due to overdose. - Echo 09/17/22 EF 35-40% RV mildly down - NYHA III - limited mostly by back pain - Volume status ok on torsemide 20 daily - Continue coreg 6.25 bid - Continue  Farxiga 10 mg daily. - Continue lisinopril 10 mg daily. - Repeat echo as above - ICD interrogation ~ 25 episodes of NSVT. No AF or sustained VT. Volume ok Personally reviewed  3, CAD with ischemic CM - Stable by cath 2015. - Recent type II NSTEMI 2/2 overdose. - No current s/s angina but not overly active - Continue DAPT, statin. - Pre-op stress test as above  4. Chronic respiratory failure - Uses oxygen at night.  5 Tobacco use - Continues to smoke. Encouraged cessation   6. Polycythemia  - Followed by Dr. Maren Reamer at [facility] and felt to be due to chronic hypoxia from OSA.  - Seen by Dr. Clelia Croft who agree.  7. Spinal stenosis - see discussion above  I spent a total of 45 minutes today: 1) reviewing the patient's medical records including previous charts, labs and recent notes from other providers; 2) examining the patient and counseling them on their medical issues/explaining the plan of care; 3) adjusting meds as needed and 4) ordering lab work or other needed tests.    Arvilla Meres MD  2:16 PM  09/14/23

## 2023-09-14 NOTE — Telephone Encounter (Signed)
 Alert received from [employer] solutions:  Alert remote transmission:  VT in the monitor zone 30 NSVT classified events and 3 SVT classified events, longest duration 10 min 46 sec, HR 160, EGM's c/w AFL with RVR, overall controlled rates Presenting rhythm AF in progress from 3/4, no OAC per EPIC - route to triage high alert per protocol  Pt has follow up scheduled 09/15/2023.  All strips reviewed.   All appear to be AFL with RVR.

## 2023-09-14 NOTE — Patient Instructions (Signed)
 Medication Changes:  None, continue current mediations  Testing/Procedures:  Your physician has requested that you have an echocardiogram. Echocardiography is a painless test that uses sound waves to create images of your heart. It provides your doctor with information about the size and shape of your heart and how well your heart's chambers and valves are working. This procedure takes approximately one hour. There are no restrictions for this procedure. Please do NOT wear cologne, perfume, aftershave, or lotions (deodorant is allowed). Please arrive 15 minutes prior to your appointment time.  Please note: We ask at that you not bring children with you during ultrasound (echo/ vascular) testing. Due to room size and safety concerns, children are not allowed in the ultrasound rooms during exams. Our front office staff cannot provide observation of children in our lobby area while testing is being conducted. An adult accompanying a patient to their appointment will only be allowed in the ultrasound room at the discretion of the ultrasound technician under special circumstances. We apologize for any inconvenience.   You will be called to schedule your cardiac PET Scan:    Please report to Radiology at the [facility] Main Entrance 30 minutes early for your test.  [address] [place], [place] [address]  How to Prepare for Your Cardiac PET/CT Stress Test:  Nothing to eat or drink, except water, 3 hours prior to arrival time.  NO caffeine/decaffeinated products, or chocolate 12 hours prior to arrival. (Please note decaffeinated beverages (teas/coffees) still contain caffeine).  If you have caffeine within 12 hours prior, the test will need to be rescheduled.  Medication instructions: Do not take erectile dysfunction medications for 72 hours prior to test (sildenafil, tadalafil) Do not take nitrates (isosorbide mononitrate, Ranexa) the day before or day of test Do not take  tamsulosin the day before or morning of test Hold theophylline containing medications for 12 hours. Hold Dipyridamole 48 hours prior to the test.  Diabetic Preparation: not applicable   NO perfume, cologne or lotion on chest or abdomen area. FEMALES - Please avoid wearing dresses to this appointment.  Total time is 1 to 2 hours; you may want to bring reading material for the waiting time.  In preparation for your appointment, medication and supplies will be purchased.  Appointment availability is limited, so if you need to cancel or reschedule, please call the Radiology Department Scheduler at [phone] 24 hours in advance to avoid a cancellation fee of $100.00  What to Expect When you Arrive:  Once you arrive and check in for your appointment, you will be taken to a preparation room within the Radiology Department.  A technologist or Nurse will obtain your medical history, verify that you are correctly prepped for the exam, and explain the procedure.  Afterwards, an IV will be started in your arm and electrodes will be placed on your skin for EKG monitoring during the stress portion of the exam. Then you will be escorted to the PET/CT scanner.  There, staff will get you positioned on the scanner and obtain a blood pressure and EKG.  During the exam, you will continue to be connected to the EKG and blood pressure machines.  A small, safe amount of a radioactive tracer will be injected in your IV to obtain a series of pictures of your heart along with an injection of a stress agent.    After your Exam:  It is recommended that you eat a meal and drink a caffeinated beverage to counter  act any effects of the stress agent.  Drink plenty of fluids for the remainder of the day and urinate frequently for the first couple of hours after the exam.  Your doctor will inform you of your test results within 7-10 business days.  For more information and frequently asked questions, please visit our  website: [email or web address]  For questions about your test or how to prepare for your test, please call: Cardiac Imaging Nurse Navigators Office: [phone]    Special Instructions // Education:  Do the following things EVERYDAY: Weigh yourself in the morning before breakfast. Write it down and keep it in a log. Take your medicines as prescribed Eat low salt foods--Limit salt (sodium) to 2000 mg per day.  Stay as active as you can everyday Limit all fluids for the day to less than 2 liters   Follow-Up in: 6 months (Sept), **PLEASE CALL OUR OFFICE IN JULY TO SCHEDULE THIS APPOINTMENT   At the Advanced [facility] Failure Clinic, you and your health needs are our priority. We have a designated team specialized in the treatment of Heart Failure. This Care Team includes your primary Heart Failure Specialized Cardiologist (physician), Advanced Practice Providers (APPs- Physician Assistants and Nurse Practitioners), and Pharmacist who all work together to provide you with the care you need, when you need it.   You may see any of the following providers on your designated Care Team at your next follow up:  Dr. Arvilla Meres Dr. Marca Ancona Dr. Dorthula Nettles Dr. Theresia Bough Tonye Becket, NP Robbie Lis, [place] [facility] [place], [place] Brynda Peon, NP [place], NP Karle Plumber, PharmD   Please be sure to bring in all your medications bottles to every appointment.   Need to Contact us:  If you have any questions or concerns before your next appointment please send [place] a message through [place] or call our office at [phone].    TO LEAVE A MESSAGE FOR THE NURSE SELECT OPTION 2, PLEASE LEAVE A MESSAGE INCLUDING: YOUR NAME DATE OF BIRTH CALL BACK NUMBER REASON FOR CALL**this is important as we prioritize the call backs  YOU WILL RECEIVE A CALL BACK THE SAME DAY AS LONG AS YOU CALL BEFORE 4:00 PM

## 2023-09-14 NOTE — Progress Notes (Signed)
 This encounter was created in error - please disregard.

## 2023-09-15 ENCOUNTER — Ambulatory Visit: Payer: BC Managed Care – PPO | Attending: Pulmonary Disease | Admitting: Pulmonary Disease

## 2023-09-15 DIAGNOSIS — I472 Ventricular tachycardia, unspecified: Secondary | ICD-10-CM

## 2023-09-15 DIAGNOSIS — I5022 Chronic systolic (congestive) heart failure: Secondary | ICD-10-CM

## 2023-09-15 DIAGNOSIS — I480 Paroxysmal atrial fibrillation: Secondary | ICD-10-CM

## 2023-09-15 DIAGNOSIS — I255 Ischemic cardiomyopathy: Secondary | ICD-10-CM

## 2023-09-15 DIAGNOSIS — Z01818 Encounter for other preprocedural examination: Secondary | ICD-10-CM

## 2023-09-17 ENCOUNTER — Encounter: Payer: Self-pay | Admitting: Pulmonary Disease

## 2023-09-20 DIAGNOSIS — F4322 Adjustment disorder with anxiety: Secondary | ICD-10-CM | POA: Diagnosis not present

## 2023-09-20 DIAGNOSIS — F411 Generalized anxiety disorder: Secondary | ICD-10-CM | POA: Diagnosis not present

## 2023-09-21 ENCOUNTER — Encounter: Payer: Self-pay | Admitting: Internal Medicine

## 2023-09-21 NOTE — Progress Notes (Signed)
 Remote ICD transmission.

## 2023-09-22 ENCOUNTER — Telehealth (HOSPITAL_COMMUNITY): Payer: Self-pay | Admitting: Cardiology

## 2023-09-22 NOTE — Telephone Encounter (Signed)
 Patient called upset with PET scan appt date  Reports surgery is pending PET results  Request to have testing facility changed  Educated on HF clinic only able to obtain auths for [facility] facility. Will forward to clinical team to review/investigate

## 2023-09-24 ENCOUNTER — Other Ambulatory Visit (HOSPITAL_COMMUNITY): Payer: Self-pay | Admitting: Internal Medicine

## 2023-09-24 DIAGNOSIS — I5022 Chronic systolic (congestive) heart failure: Secondary | ICD-10-CM

## 2023-09-24 NOTE — Telephone Encounter (Signed)
 Defer to management for decision

## 2023-09-27 NOTE — Telephone Encounter (Signed)
 Mess sent to PET sch team to work in sooner if possible, will await their response

## 2023-09-28 DIAGNOSIS — F411 Generalized anxiety disorder: Secondary | ICD-10-CM | POA: Diagnosis not present

## 2023-10-01 NOTE — Telephone Encounter (Signed)
  Received: 4 days ago [name]  Trevor Yono M, RN; Lennie Odor, RN Hey, patient is on waitlist, I did call to offer a spot for [name] just waiting on a call back.

## 2023-10-03 DIAGNOSIS — J449 Chronic obstructive pulmonary disease, unspecified: Secondary | ICD-10-CM | POA: Diagnosis not present

## 2023-10-03 DIAGNOSIS — J9611 Chronic respiratory failure with hypoxia: Secondary | ICD-10-CM | POA: Diagnosis not present

## 2023-10-05 DIAGNOSIS — R2681 Unsteadiness on feet: Secondary | ICD-10-CM | POA: Diagnosis not present

## 2023-10-12 DIAGNOSIS — F411 Generalized anxiety disorder: Secondary | ICD-10-CM | POA: Diagnosis not present

## 2023-10-14 DIAGNOSIS — F411 Generalized anxiety disorder: Secondary | ICD-10-CM | POA: Diagnosis not present

## 2023-10-15 ENCOUNTER — Ambulatory Visit (HOSPITAL_COMMUNITY): Admission: RE | Admit: 2023-10-15 | Source: Ambulatory Visit

## 2023-10-18 ENCOUNTER — Telehealth: Payer: Self-pay

## 2023-10-18 NOTE — Telephone Encounter (Signed)
 Alert received from [employer] for VT monitor zone event Since 3/4: -7 VT-1 events, longest 3 min 5 sec with V-rates 150-170s.  Of EGMs to view, AF with faster rates.  PVC couplet and triplets.  -17 SVT events, longest 2 min with V-rates 150s-196s.  Of EGMs to view, AF with faster rates.    -32 NSVT events, longest 34 sec.  No EGMS to view.  -AMS events, longest 2 hr 10 min on 4/4.  Not on OAC per EMR. Sending for review due to no OAC and faster rates with AF.    Note events were 10/15/23 from 9:48 am to 11:45am. Attempted to contact patient/DPR members listed. No success speaking or leave VM on any numbers.

## 2023-10-19 NOTE — Telephone Encounter (Signed)
 [facility] to discuss also sent my chart message.

## 2023-10-20 NOTE — Telephone Encounter (Signed)
 Patient states he is unable to recall any specific symptoms on 10/15/23. Patient states he experienced chest pain this morning that last for approx. 5 minutes that radiates to left jaw. Patient states he has had this in the past although normally pain radiates to arm and not jaw.  Patient states he has been under a lot of stress lately and has not slept in days.   Patient advised he needs to go to ER. Patient declines. States he has family matters to needs to handle today. Encouraged patient if he has further chest pain to go to ER. Patient voiced understanding.  Patient has not been compliant with coreg 6.25mg  BID, states he normally takes once a day. Patient encouraged to take medications as prescribed to reduce chances of increased heart rates.  Patient encouraged to contact PCP about difficulty sleeping.

## 2023-10-21 NOTE — Telephone Encounter (Signed)
 Pt called in letting us know he doesn't feel good and is going to send in a transmission

## 2023-10-21 NOTE — Telephone Encounter (Signed)
  Patient was unable to voiced any specific symptoms of how he was feeling. Advised patient his remote transmission does not show any episodes and his heart is in NSR. Patient advised a scheduler will contact him for an apt. Pt voiced understanding and agreeable to plan.

## 2023-10-24 NOTE — Progress Notes (Unsigned)
 Electrophysiology Office Note:   Date:  10/25/2023  ID:  [name], DOB [birth year], MRN [id number]  Primary Cardiologist: None Primary Heart Failure: Jules Oar, MD Electrophysiologist: Richardo Chandler, MD       History of Present Illness:   [name] is a 63 y.o. male with h/o VT, ICM/sCHF s/p ICD, CAD s/p PCI 2015 to RCA, OSA, MAI, chronic hypoxic respiratory failure, depression / anxiety, spinal stenosis, opoid use/abuse with overdose admissions seen today for cardiac clearance for L2-L3, L3-L4 DLIF left prone transpsoas, L4-L5, L5-S1 TLIF- posterior lateral and interbody fusion & for routine electrophysiology followup.   Pt has has a few missed visits.  Alert from [employer] on 4/10 for 7 VT-1 events, longest 3 minutes 5 sec with V rates 150-170's > EGM review showed AF with RVR.  PVC couplet / triplets, 17 SVT events, 32 NSVT events with no EGM to review.  Phone conversation with patient and RN on 10/20/23 and he had chest pain, was advised to go to ER but he declined.  He had not been taking coreg.    Since last being seen in our clinic the patient reports he has not felt well. He states he does not like to take his coreg with his ED meds > he misses doses 5/7 days as he likes to take his ED meds in the evening. He reports the day of the alerts he had a particularly stressful phone conversation and was very agitated. He has not had episodes since.   He denies chest pain, palpitations, dyspnea, PND, orthopnea, nausea, vomiting, dizziness, syncope, edema, weight gain, or early satiety.   Review of systems complete and found to be negative unless listed in HPI.   EP Information / Studies Reviewed:    EKG is ordered today. Personal review as below.  EKG Interpretation Date/Time:  Monday October 25 2023 15:42:32 EDT Ventricular Rate:  93 PR Interval:  150 QRS Duration:  118 QT Interval:  390 QTC Calculation: 484 R Axis:   73  Text Interpretation: Sinus rhythm with  frequent Premature ventricular complexes and Premature atrial complexes Confirmed by Creighton Doffing ([id number]) on 10/25/2023 4:03:40 PM   ICD Interrogation-  reviewed in detail today,  See PACEART report.  Device History: Abbott Dual Chamber ICD implanted 08/11/2021 for HFrEF History of appropriate therapy: Yes History of AAD therapy: No   Risk Assessment/Calculations:    CHA2DS2-VASc Score = 5   This indicates a 7.2% annual risk of stroke. The patient's score is based upon: CHF History: 1 HTN History: 1 Diabetes History: 0 Stroke History: 2 Vascular Disease History: 1 Age Score: 0 Gender Score: 0             Physical Exam:   VS:  BP (!) 88/60 (BP Location: Left Arm, Cuff Size: Normal)   Pulse 93   Ht 1' (0.305 m)   SpO2 91%   BMI 943.29 kg/m    Wt Readings from Last 3 Encounters:  09/14/23 193 lb 3.2 oz (87.6 kg)  07/08/23 195 lb (88.5 kg)  09/17/22 171 lb (77.6 kg)     GEN: chronically ill appearing adult male in no acute distress, sitting in wheelchair NECK: No JVD; No carotid bruits CARDIAC: Regular rate and rhythm, no murmurs, rubs, gallops RESPIRATORY:  Clear to auscultation without rales, wheezing or rhonchi  ABDOMEN: Soft, non-tender, non-distended EXTREMITIES:  No edema; No deformity   ASSESSMENT AND PLAN:    Chronic Systolic Dysfunction s/p Abbott dual chamber  ICD  VT / NSVT -euvolemic on exam and by device  -Stable on an appropriate medical regimen -Normal ICD function -See Pace Art report -No programming changes  -change coreg to Toprol 50 mg for once daily dosing, hopeful for compliance   Paroxysmal AF CHA2DS2-VASc 5  -noted episode of AF lasting ~ 2h on device when added up in total > not on OAC.  Given low burden will monitor for now.  Binned in VT1 zone for ventricular rates as he has not been taking his coreg.   Tobacco Abuse  -cessation counseling, especially pre-procedure    Pre-Operative Cardiac Clearance Procedure:  L2-L3, L3-L4 DLIF  left prone transpsoas, L4-L5, L5-S1 TLIF- posterior lateral and interbody fusion  Date of Surgery: TBD Type of Anesthesia:  General  DAPT: ok to hold ASA, Plavix 5 days before procedure, resume as soon as safe in post-operative period. Surgeon:  Cleatrice Curl. Andy Bannister, MD  Surgeon's Group or Practice Name:  [facility]  Phone number:  [phone] Fax number:  [phone]       [name] perioperative risk of a major cardiac event is 11% according to the Revised Cardiac Risk Index (RCRI).  Therefore, he is at high risk for perioperative complications.   Will route to the Device Clinic for specific programming.  Patient is not dependent.   He is pending work up with Dr. Julane Ny > cardiac PET and ECHO.  Defer medical clearance to Dr. Julane Ny.  Recommendations:  Antiplatelet and/or Anticoagulation Recommendations: Aspirin can be held for 5 days prior to his surgery.  Please resume Aspirin post operatively when it is felt to be safe from a bleeding standpoint.  Clopidogrel (Plavix) can be held for 5 days prior to his surgery and resumed as soon as possible post op.    Disposition:   Follow up with Dr. Rodolfo Clan in 6 months   Signed, Creighton Doffing, NP-C, AGACNP-BC [facility] - Electrophysiology  10/25/2023, 5:46 PM

## 2023-10-25 ENCOUNTER — Encounter: Payer: Self-pay | Admitting: Pulmonary Disease

## 2023-10-25 ENCOUNTER — Ambulatory Visit: Attending: Pulmonary Disease | Admitting: Pulmonary Disease

## 2023-10-25 VITALS — BP 88/60 | HR 93 | Ht <= 58 in

## 2023-10-25 DIAGNOSIS — I255 Ischemic cardiomyopathy: Secondary | ICD-10-CM

## 2023-10-25 DIAGNOSIS — I472 Ventricular tachycardia, unspecified: Secondary | ICD-10-CM | POA: Diagnosis not present

## 2023-10-25 DIAGNOSIS — Z720 Tobacco use: Secondary | ICD-10-CM

## 2023-10-25 DIAGNOSIS — I5022 Chronic systolic (congestive) heart failure: Secondary | ICD-10-CM

## 2023-10-25 DIAGNOSIS — Z01818 Encounter for other preprocedural examination: Secondary | ICD-10-CM

## 2023-10-25 DIAGNOSIS — I480 Paroxysmal atrial fibrillation: Secondary | ICD-10-CM

## 2023-10-25 LAB — CUP PACEART INCLINIC DEVICE CHECK
Date Time Interrogation Session: 20250414171905
Implantable Lead Connection Status: 753985
Implantable Lead Connection Status: 753985
Implantable Lead Implant Date: 20120201
Implantable Lead Implant Date: 20120201
Implantable Lead Location: 753859
Implantable Lead Location: 753860
Implantable Pulse Generator Implant Date: 20230131
Pulse Gen Serial Number: 111050201

## 2023-10-25 MED ORDER — METOPROLOL SUCCINATE ER 50 MG PO TB24: 50.0000 mg | ORAL_TABLET | Freq: Every morning | ORAL | 3 refills | Status: DC

## 2023-10-25 NOTE — Patient Instructions (Signed)
 Medication Instructions:  1.Stop coreg 2.Start metoprolol succinate (Toprol XL) 50 mg every morning *If you need a refill on your cardiac medications before your next appointment, please call your pharmacy*  Lab Work: None ordered If you have labs (blood work) drawn today and your tests are completely normal, you will receive your results only by: MyChart Message (if you have MyChart) OR A paper copy in the mail If you have any lab test that is abnormal or we need to change your treatment, we will call you to review the results.  Follow-Up: At [facility], you and your health needs are our priority.  As part of our continuing mission to provide you with exceptional heart care, our providers are all part of one team.  This team includes your primary Cardiologist (physician) and Advanced Practice Providers or APPs (Physician Assistants and Nurse Practitioners) who all work together to provide you with the care you need, when you need it.  Your next appointment:   6 month(s)  Provider:   You will see one of the following Advanced Practice Providers on your designated Care Team:   Mertha Abrahams, [place] Bambi Lever "Jonelle Neri" Tillery, PA-C Creighton Doffing, NP     1st Floor: - Lobby - Registration  - Pharmacy  - Lab - Cafe  2nd Floor: - PV Lab - Diagnostic Testing (echo, CT, nuclear med)  3rd Floor: - Vacant  4th Floor: - TCTS (cardiothoracic surgery) - AFib Clinic - Structural Heart Clinic - Vascular Surgery  - Vascular Ultrasound  5th Floor: - HeartCare Cardiology (general and EP) - Clinical Pharmacy for coumadin, hypertension, lipid, weight-loss medications, and med management appointments    Valet parking services will be available as well.

## 2023-10-26 ENCOUNTER — Encounter: Payer: Self-pay | Admitting: Internal Medicine

## 2023-10-26 DIAGNOSIS — F411 Generalized anxiety disorder: Secondary | ICD-10-CM | POA: Diagnosis not present

## 2023-10-26 NOTE — Progress Notes (Signed)
 PERIOPERATIVE PRESCRIPTION FOR IMPLANTED CARDIAC DEVICE PROGRAMMING  Patient Information: Name:  [name]  DOB:  [birth year]  MRN:  [id number]  Pre-Operative Cardiac Clearance Procedure:  L2-L3, L3-L4 DLIF left prone transpsoas, L4-L5, L5-S1 TLIF- posterior lateral and interbody fusion  Date of Surgery: TBD Type of Anesthesia:  General  Surgeon:  Cleatrice Curl. Andy Bannister, MD  Surgeon's Group or Practice Name:  [facility]  Phone number:  [phone] Fax number:  [phone]   Device Information:  Clinic EP Physician:  Richardo Chandler, MD   Device Type:  Defibrillator Manufacturer and Phone #:  St. Jude/Abbott: [phone] Pacemaker Dependent?:  No. Date of Last Device Check:  10/26/23 Normal Device Function?:  Yes.    Electrophysiologist's Recommendations:  Have magnet available. Provide continuous ECG monitoring when magnet is used or reprogramming is to be performed.  Procedure will likely interfere with device function.  Device should be programmed:  Tachy therapies disabled With prone position patient should be reprogrammed w/ tachy therapies off  Per Device Clinic Standing Orders, Catherin Closs, RN  3:16 PM 10/26/2023

## 2023-11-02 DIAGNOSIS — F411 Generalized anxiety disorder: Secondary | ICD-10-CM | POA: Diagnosis not present

## 2023-11-03 DIAGNOSIS — J9611 Chronic respiratory failure with hypoxia: Secondary | ICD-10-CM | POA: Diagnosis not present

## 2023-11-03 DIAGNOSIS — J449 Chronic obstructive pulmonary disease, unspecified: Secondary | ICD-10-CM | POA: Diagnosis not present

## 2023-11-04 ENCOUNTER — Telehealth: Payer: Self-pay | Admitting: Pulmonary Disease

## 2023-11-04 ENCOUNTER — Telehealth: Payer: Self-pay | Admitting: Internal Medicine

## 2023-11-04 NOTE — Telephone Encounter (Signed)
 Patient identification verified by 2 forms. Hilton Lucky, RN    Called and spoke to patient  Patient states:   -he is having reaction to Metoprolol  prescription   -shaking, tired, shortness of breath, lightheadedness/dizziness   -he has not checked his blood pressure   -did not have any of these symptoms when he was on the carvedilol    -has been taking Metoprolol  on 4/17 and symptoms on going since starting Rx   -did not take Metoprolol  this morning, took carvedilol  6.25mg  instead  Informed patient message sent to provider and pharmacy for assistance  Patient verbalized understanding, no questions at this time

## 2023-11-04 NOTE — Telephone Encounter (Signed)
 Please see documentation in 4/24 telephone encounter

## 2023-11-04 NOTE — Telephone Encounter (Signed)
 Pt c/o medication issue:  1. Name of Medication: metoprolol  succinate (TOPROL -XL) 50 MG 24 hr tablet   2. How are you currently taking this medication (dosage and times per day)?    3. Are you having a reaction (difficulty breathing--STAT)? no  4. What is your medication issue? Patient states that he is having a bad reaction to the medication. Please advise.

## 2023-11-04 NOTE — Telephone Encounter (Signed)
 Pt c/o medication issue:  1. Name of Medication:   metoprolol  succinate (TOPROL -XL) 50 MG 24 hr tablet    2. How are you currently taking this medication (dosage and times per day)?  Take 1 tablet (50 mg total) by mouth every morning. Take with or immediately following a meal.     3. Are you having a reaction (difficulty breathing--STAT)? No  4. What is your medication issue? Pt is requesting a callback regarding him having side effects while taking this medication. Pt is experiencing fatigue, shaking and weakness. Please advise

## 2023-11-09 NOTE — Telephone Encounter (Signed)
 Attempted to call patient, no answer left message requesting a call back.

## 2023-11-10 ENCOUNTER — Ambulatory Visit: Payer: BC Managed Care – PPO

## 2023-11-10 NOTE — Telephone Encounter (Signed)
 2nd attempt to call patient, no answer left message requesting a call back.

## 2023-11-11 MED ORDER — CARVEDILOL 6.25 MG PO TABS: 6.2500 mg | ORAL_TABLET | Freq: Two times a day (BID) | ORAL | 3 refills | Status: DC

## 2023-11-11 NOTE — Telephone Encounter (Signed)
 Thomasena Fleming, NP  You2 days ago    It should be BID, thank you  Brandi   You  Ollis, Brandi L, NP2 days ago    Hi thank you for replying Should I advise him to take coreg  6.25 mg daily or BID?   Thomasena Fleming, NP  You; Maccia, Melissa D, RPH-CPP3 days ago    We changed in the hope he would have better compliance.  If he is not tolerating, he can go back to Coreg .  He was only taking it once daily though prescribed BID.  Brandi    Patient identification verified by 2 forms. Hilton Lucky, RN    Called and spoke to patient  Relayed provider message  Advised patient:   -discontinue Metoprolol    -continue taking Carvedilol  6.25mg  BID   -monitor symptoms and outreach with concerns  Patient aware Updated Rx sent to preferred pharmacy  Reviewed ED warning signs/precautions  Patient verbalized understanding, no questions at this time

## 2023-11-15 DIAGNOSIS — F411 Generalized anxiety disorder: Secondary | ICD-10-CM | POA: Diagnosis not present

## 2023-11-23 ENCOUNTER — Other Ambulatory Visit (HOSPITAL_COMMUNITY): Payer: Self-pay | Admitting: Internal Medicine

## 2023-11-29 DIAGNOSIS — F411 Generalized anxiety disorder: Secondary | ICD-10-CM | POA: Diagnosis not present

## 2023-12-01 DIAGNOSIS — F4322 Adjustment disorder with anxiety: Secondary | ICD-10-CM | POA: Diagnosis not present

## 2023-12-03 ENCOUNTER — Telehealth: Payer: Self-pay

## 2023-12-03 ENCOUNTER — Encounter (HOSPITAL_COMMUNITY): Payer: Self-pay

## 2023-12-03 ENCOUNTER — Telehealth (HOSPITAL_COMMUNITY): Payer: Self-pay | Admitting: *Deleted

## 2023-12-03 DIAGNOSIS — J449 Chronic obstructive pulmonary disease, unspecified: Secondary | ICD-10-CM | POA: Diagnosis not present

## 2023-12-03 DIAGNOSIS — J9611 Chronic respiratory failure with hypoxia: Secondary | ICD-10-CM | POA: Diagnosis not present

## 2023-12-03 NOTE — Telephone Encounter (Signed)
 Attempted to call patient regarding upcoming cardiac PET appointment. VM full.  Chase Copping RN Navigator Cardiac Imaging [facility] V[facility] Services [phone] Office [phone] Cell

## 2023-12-03 NOTE — Telephone Encounter (Signed)
 Attempted to call the patient. No answer- I left a message to please call back to the device clinic.

## 2023-12-03 NOTE — Telephone Encounter (Signed)
 Alert received from [employer]:  Alert remote transmission: VT episode in the monitor zone Events occurred 5/22 11:39-12:38, EGM's c/w AF with RVR.  Hx of brief AF in the past, no OAC per EPIC - route to triage high alert per protocol 9 VT-1, 32 NSVT, 10 SVT classified events Presenting regular AS/VS  Outreach:

## 2023-12-07 ENCOUNTER — Ambulatory Visit (HOSPITAL_COMMUNITY): Admission: RE | Admit: 2023-12-07 | Source: Ambulatory Visit

## 2023-12-07 NOTE — Telephone Encounter (Signed)
 Patient called, denies any symptoms or complaints. AT/AF burden <1%, hx of same and on no OAC. HX of SCAF.

## 2023-12-08 ENCOUNTER — Ambulatory Visit (HOSPITAL_COMMUNITY)
Admission: RE | Admit: 2023-12-08 | Discharge: 2023-12-08 | Disposition: A | Discharge status: Home / Self Care | Source: Ambulatory Visit | Attending: Internal Medicine | Admitting: Internal Medicine

## 2023-12-08 ENCOUNTER — Encounter (HOSPITAL_COMMUNITY): Payer: Self-pay

## 2023-12-08 DIAGNOSIS — I5042 Chronic combined systolic (congestive) and diastolic (congestive) heart failure: Secondary | ICD-10-CM | POA: Insufficient documentation

## 2023-12-08 DIAGNOSIS — I2510 Atherosclerotic heart disease of native coronary artery without angina pectoris: Secondary | ICD-10-CM | POA: Insufficient documentation

## 2023-12-08 MED ORDER — REGADENOSON 0.4 MG/5ML IV SOLN
INTRAVENOUS | Status: AC
  Filled 2023-12-08: qty 5

## 2023-12-08 MED ORDER — REGADENOSON 0.4 MG/5ML IV SOLN: 0.4000 mg | Freq: Once | INTRAVENOUS | Status: DC

## 2023-12-08 MED ORDER — RUBIDIUM RB82 GENERATOR (RUBYFILL)
22.9000 | PACK | Freq: Once | INTRAVENOUS | Status: AC
Start: 2023-12-08 — End: 2023-12-08
  Administered 2023-12-08: 22.9 via INTRAVENOUS

## 2023-12-08 NOTE — Progress Notes (Signed)
 Patient ID: [name], male   DOB: 05-21-1961, 63 y.o.   MRN: [id number]  Patient arrived for PET/CT. He stated he took Ativan this AM prior to procedure. Patient was confused and lethargic. Blood Pressure prior to administering Lexiscan was 69/49.  Lexiscan not given. Dr Paulita Boss was notified. He advised patient to be seen in ER. Patient refused to be seen. Dr. Paulita Boss notified to patient's refusal and advised to  follow up with his physician. Patient left with his wife. [place] Bees

## 2023-12-20 DIAGNOSIS — F411 Generalized anxiety disorder: Secondary | ICD-10-CM | POA: Diagnosis not present

## 2023-12-27 DIAGNOSIS — F411 Generalized anxiety disorder: Secondary | ICD-10-CM | POA: Diagnosis not present

## 2023-12-30 DIAGNOSIS — F411 Generalized anxiety disorder: Secondary | ICD-10-CM | POA: Diagnosis not present

## 2024-01-03 DIAGNOSIS — F411 Generalized anxiety disorder: Secondary | ICD-10-CM | POA: Diagnosis not present

## 2024-01-03 DIAGNOSIS — J449 Chronic obstructive pulmonary disease, unspecified: Secondary | ICD-10-CM | POA: Diagnosis not present

## 2024-01-03 DIAGNOSIS — J9611 Chronic respiratory failure with hypoxia: Secondary | ICD-10-CM | POA: Diagnosis not present

## 2024-01-10 DIAGNOSIS — F411 Generalized anxiety disorder: Secondary | ICD-10-CM | POA: Diagnosis not present

## 2024-01-11 DIAGNOSIS — I10 Essential (primary) hypertension: Secondary | ICD-10-CM | POA: Diagnosis not present

## 2024-01-11 DIAGNOSIS — E1151 Type 2 diabetes mellitus with diabetic peripheral angiopathy without gangrene: Secondary | ICD-10-CM | POA: Diagnosis not present

## 2024-01-17 DIAGNOSIS — F411 Generalized anxiety disorder: Secondary | ICD-10-CM | POA: Diagnosis not present

## 2024-01-18 DIAGNOSIS — Z5181 Encounter for therapeutic drug level monitoring: Secondary | ICD-10-CM | POA: Diagnosis not present

## 2024-01-18 DIAGNOSIS — F4322 Adjustment disorder with anxiety: Secondary | ICD-10-CM | POA: Diagnosis not present

## 2024-01-21 ENCOUNTER — Other Ambulatory Visit (HOSPITAL_COMMUNITY): Payer: Self-pay | Admitting: Internal Medicine

## 2024-01-21 DIAGNOSIS — E291 Testicular hypofunction: Secondary | ICD-10-CM | POA: Diagnosis not present

## 2024-01-24 DIAGNOSIS — F411 Generalized anxiety disorder: Secondary | ICD-10-CM | POA: Diagnosis not present

## 2024-01-28 DIAGNOSIS — F411 Generalized anxiety disorder: Secondary | ICD-10-CM | POA: Diagnosis not present

## 2024-02-02 DIAGNOSIS — J9611 Chronic respiratory failure with hypoxia: Secondary | ICD-10-CM | POA: Diagnosis not present

## 2024-02-02 DIAGNOSIS — J449 Chronic obstructive pulmonary disease, unspecified: Secondary | ICD-10-CM | POA: Diagnosis not present

## 2024-02-07 DIAGNOSIS — F411 Generalized anxiety disorder: Secondary | ICD-10-CM | POA: Diagnosis not present

## 2024-02-08 NOTE — Procedures (Signed)
 THE PATIENT WAS DESENSITIZED TO THE CPAP DEVICE AT A PRESSURE OF 11CM H20 WITH THE USE OF A LARGE [employer]ER [employer] NASAL/ORAL MASK. THE PATIENT TOLERATED THE TRIAL WELL WITH NO NOTED COMPLICATIONS PLEASE NOTE THAT THE INTERFACE WAS GIVEN TO THE PATIENT FOR HOME USE.  -VB

## 2024-02-09 ENCOUNTER — Telehealth: Payer: Self-pay

## 2024-02-09 ENCOUNTER — Ambulatory Visit: Payer: BC Managed Care – PPO

## 2024-02-09 DIAGNOSIS — I255 Ischemic cardiomyopathy: Secondary | ICD-10-CM

## 2024-02-09 LAB — CUP PACEART REMOTE DEVICE CHECK
Battery Remaining Longevity: 79 mo
Battery Remaining Percentage: 76 %
Battery Voltage: 2.99 V
Brady Statistic AP VP Percent: 1 %
Brady Statistic AP VS Percent: 1.6 %
Brady Statistic AS VP Percent: 1 %
Brady Statistic AS VS Percent: 93 %
Brady Statistic RA Percent Paced: 1 %
Brady Statistic RV Percent Paced: 1 %
Date Time Interrogation Session: 20250730020458
HighPow Impedance: 61 Ohm
Implantable Lead Connection Status: 753985
Implantable Lead Connection Status: 753985
Implantable Lead Implant Date: 20120201
Implantable Lead Implant Date: 20120201
Implantable Lead Location: 753859
Implantable Lead Location: 753860
Implantable Pulse Generator Implant Date: 20230131
Lead Channel Impedance Value: 390 Ohm
Lead Channel Impedance Value: 440 Ohm
Lead Channel Pacing Threshold Amplitude: 0.75 V
Lead Channel Pacing Threshold Amplitude: 1.5 V
Lead Channel Pacing Threshold Pulse Width: 0.5 ms
Lead Channel Pacing Threshold Pulse Width: 0.7 ms
Lead Channel Sensing Intrinsic Amplitude: 12 mV
Lead Channel Sensing Intrinsic Amplitude: 4.8 mV
Lead Channel Setting Pacing Amplitude: 2 V
Lead Channel Setting Pacing Amplitude: 3 V
Lead Channel Setting Pacing Pulse Width: 0.7 ms
Lead Channel Setting Sensing Sensitivity: 0.5 mV
Pulse Gen Serial Number: 111050201
Zone Setting Status: 755011

## 2024-02-09 NOTE — Telephone Encounter (Signed)
 Alert received from [employer] for  Presenting rhythm:  AF/VS in progress from 7/30. Multiple NSVT, EGM's c/w irregular R-R, trends reflect AF with poor rate control. HF diagnostics currently abnoral. Pt has hx of SCAF - no OAC.  Attempted to contact patient. No answer, unable to leave VM d/t full.

## 2024-02-10 DIAGNOSIS — F411 Generalized anxiety disorder: Secondary | ICD-10-CM | POA: Diagnosis not present

## 2024-02-10 NOTE — Telephone Encounter (Signed)
 Left message requesting call back.  Will send MyChart message.

## 2024-02-10 NOTE — Telephone Encounter (Signed)
 Call back received from Pt.  Requested Pt send manual transmission.   Pt states he will send that today.  Pt asked if he could take some of his blood pressure medications at night.  Advised he could try changing his lisinopril  to evening if he would like.    Pt also asking if he could reschedule his PET scan.  He will call Heart Failure clinic to reschedule.  Await transmission and follow up.

## 2024-02-14 NOTE — Telephone Encounter (Signed)
 Patient is returning call.

## 2024-02-14 NOTE — Telephone Encounter (Signed)
 Returned to NSR.

## 2024-02-15 DIAGNOSIS — F4322 Adjustment disorder with anxiety: Secondary | ICD-10-CM | POA: Diagnosis not present

## 2024-02-15 NOTE — Telephone Encounter (Signed)
 Left detailed message on VM per DPR that his heart rhythm has returned to normal. Advised to call if any further questions arise and direct phone number left.

## 2024-02-16 DIAGNOSIS — R2681 Unsteadiness on feet: Secondary | ICD-10-CM | POA: Diagnosis not present

## 2024-02-24 DIAGNOSIS — R2681 Unsteadiness on feet: Secondary | ICD-10-CM | POA: Diagnosis not present

## 2024-02-29 DIAGNOSIS — R2681 Unsteadiness on feet: Secondary | ICD-10-CM | POA: Diagnosis not present

## 2024-03-03 ENCOUNTER — Telehealth: Payer: Self-pay

## 2024-03-03 DIAGNOSIS — I472 Ventricular tachycardia, unspecified: Secondary | ICD-10-CM

## 2024-03-03 DIAGNOSIS — I5043 Acute on chronic combined systolic (congestive) and diastolic (congestive) heart failure: Secondary | ICD-10-CM

## 2024-03-03 NOTE — Telephone Encounter (Signed)
 Alert remote transmission:  13 bursts of ATP therapy delivered for various events not all successful, 1- 30 J shock successful  Numerous NSVT, VT-1, SVT and some VF classified events 8/21 10:55-14:29  VF event @ -8/21- 1106am, duration 12 secs:  Dual Tach progressing to VF, successfully converted back to SR with 1,30 J shock.   VT-2 @ 14:39, HR 190, EGM's c/w AF with likely RVR, can not rule out VT, ATP was delivered x2 slowing the rate   Pt has hx of SCAF, no OAC per EPIC, appears patient was in AF several hours on 8/21.  Presenting AS/VS with PVC's  Unable to reach patient on cell or LM on VM because full.  Did reach out to emergency contact: [name] and left message on her cell to have patient call ASAP for follow up.

## 2024-03-03 NOTE — Telephone Encounter (Signed)
 4 attempts made today to reach patient/ or emergency contact.   No answer on patient's mobile and VM full - could not leave a message.  Left 2 messages for emergency contact to have patient call us  back ASAP and if having any symptoms to go to the ER this weekend.   Did discuss with Daphne Barrack, NP - patient will need labs (BMET, MAG) and in office appointment, ER if symptomatic or more shocks before then.

## 2024-03-04 DIAGNOSIS — J9611 Chronic respiratory failure with hypoxia: Secondary | ICD-10-CM | POA: Diagnosis not present

## 2024-03-07 DIAGNOSIS — F4322 Adjustment disorder with anxiety: Secondary | ICD-10-CM | POA: Diagnosis not present

## 2024-03-08 NOTE — Telephone Encounter (Signed)
 LM on patient's VM today to please call us  ASAP. Also sent a mychart message.

## 2024-03-09 NOTE — Telephone Encounter (Signed)
 Patient states he has noted declining health with progressing SOB and weakness.  Doesn't feel like he can tolerate his therapy sessions right now and is declining.  He asks me if he should postpone his PT sessions for now.  I told him if symptoms are severe, he should go to the ER and probably not a bad idea to postpone PT until he is seen and had his cardiac status evaluated.   Discussed shocks and ATP events from 8/21.  He admits he had a rough day and thought that was the case.  He says he doesn't want to call us  because we are just going to tell him to go to the hospital.    Following recommendations given:  Go to ER if symptoms worsen or become severe: SOB, chest pain, syncope etc or more shocks.  Gave [place] DMV driving restrictions X 6 months due to events.  Patient states he hasn't driven in 2 years.  He needs to be seen in clinic (prior [facility] patient) for VT/AF RVR, Shock/ATP follow up and to establish with Dr. Kennyth - add on for 03/14/24 at 1115am.  Patient to come early to clinic for blood work on first floor (BMET,MAG, BNP).   Patient verbalizes understanding and has our direct number if any further questions in interim.  He states he would also like to send a manual transmission today.  We will review and call him back if any further concerns, otherwise keep his appointment for next week.

## 2024-03-09 NOTE — Telephone Encounter (Signed)
 LM again on patient's VM.  Request to call.  Sending a certified letter.

## 2024-03-13 NOTE — Progress Notes (Signed)
 Electrophysiology Office Note:   Date:  03/15/2024  ID:  [name], DOB 05/24/1961, MRN [id number]  Primary Cardiologist: None Electrophysiologist: Elspeth Sage, MD      History of Present Illness:   [name] is a 63 y.o. male with h/o VT, ICM/sCHF s/p ICD, CAD s/p PCI 2015 to RCA, OSA, MAI, chronic hypoxic respiratory failure, depression / anxiety, spinal stenosis, opoid use/abuse with overdose admissions seen today for cardiac clearance for L2-L3, L3-L4 DLIF left prone transpsoas, L4-L5, L5-S1 TLIF- posterior lateral and interbody fusion who is being seen today for follow-up device management.  On 03/02/2024 patient had multiple device therapies.  Discussed the use of AI scribe software for clinical note transcription with the patient, who gave verbal consent to proceed.  History of Present Illness [name] is a 63 year old male with atrial fibrillation and heart failure who presents with recent episodes of device shocks and worsening symptoms.  He has been experiencing an increase in the frequency of shocks from his cardiac device over the past four to five months, with the most recent episodes occurring in the last few days. These episodes are associated with feelings of fear, nervousness, and shakiness, as well as shortness of breath and fatigue.  His atrial fibrillation has been occurring more frequently over the past four to five months. He feels progressively worse with each episode of atrial fibrillation.  His current medications include carvedilol , lisinopril , and spironolactone . Carvedilol  helps with his symptoms, but he has experienced lightheadedness with metoprolol  in the past. He is also on Plavix .  He recently experienced an episode of illness after eating fish, which he had tolerated the previous day. He has a history of kidney issues related to medication use three years ago. He is concerned about maintaining his weight and mentions previous issues with  medication approval through insurance.    Review of systems complete and found to be negative unless listed in HPI.   EP Information / Studies Reviewed:    EKG is ordered today. Personal review as below.  EKG Interpretation Date/Time:  Tuesday March 14 2024 11:28:01 EDT Ventricular Rate:  74 PR Interval:  164 QRS Duration:  126 QT Interval:  438 QTC Calculation: 486 R Axis:   102  Text Interpretation: Sinus rhythm with frequent Premature ventricular complexes Rightward axis Non-specific intra-ventricular conduction block T wave abnormality, consider inferior ischemia When compared with ECG of 25-Oct-2023 15:42, Premature atrial complexes are no longer Present T wave inversion less evident in Lateral leads Confirmed by Kennyth Chew [phone]) on 03/15/2024 11:14:39 AM   Echo 09/17/22:   1. Left ventricular ejection fraction, by estimation, is 35 to 40%. The  left ventricle has moderately decreased function. The left ventricle  demonstrates regional wall motion abnormalities (see scoring  diagram/findings for description). Left ventricular   diastolic parameters are consistent with Grade I diastolic dysfunction  (impaired relaxation). There is hypokinesis of the left ventricular,  entire inferior wall and inferolateral wall.   2. Right ventricular systolic function is mildly reduced. The right  ventricular size is normal. There is normal pulmonary artery systolic  pressure.   3. Left atrial size was mildly dilated.   4. The mitral valve is normal in structure. Trivial mitral valve  regurgitation. No evidence of mitral stenosis.   5. The aortic valve is tricuspid. Aortic valve regurgitation is not  visualized. No aortic stenosis is present.   6. The inferior vena cava is normal in size with greater than 50%  respiratory variability, suggesting right atrial pressure of 3 mmHg.   Risk Assessment/Calculations:    CHA2DS2-VASc Score = 5   This indicates a 7.2% annual risk of  stroke. The patient's score is based upon: CHF History: 1 HTN History: 1 Diabetes History: 0 Stroke History: 2 Vascular Disease History: 1 Age Score: 0 Gender Score: 0         Physical Exam:   VS:  BP 109/62   Pulse 74   Ht 6' (1.829 m)   Wt 190 lb (86.2 kg)   SpO2 95%   BMI 25.77 kg/m    Wt Readings from Last 3 Encounters:  03/14/24 190 lb (86.2 kg)  09/14/23 193 lb 3.2 oz (87.6 kg)  07/08/23 195 lb (88.5 kg)     GEN: Well nourished, well developed in no acute distress NECK: No JVD CARDIAC: Normal rate, regular rhythm RESPIRATORY:  Clear to auscultation without rales, wheezing or rhonchi  ABDOMEN: Soft, non-distended EXTREMITIES:  No edema; No deformity   ASSESSMENT AND PLAN:    # S/p ICD: # ICD shocks: - In-clinic device interrogation was performed today.  Appropriate device function and stable lead parameters.  Numerous episodes of ventricular tachycardia with unsuccessful ATP and 1 successful 30 J shock. - Continue remote monitoring.  # Paroxysmal atrial fibrillation: # Secondary hypercoagulable state due to atrial fibrillation: - Had previously been diagnosed with subclinical atrial fibrillation /device detected atrial fibrillation.  More recently, on 8/21 he developed atrial fibrillation with RVR lasting for hours, which initiated VT and VF resulting in device shock.  Given this as well as his underlying systolic heart failure, rhythm control strategy has been prioritized.   -Discussed treatment options today for AF including antiarrhythmic drug therapy and ablation. Discussed risks, recovery and likelihood of success with each treatment strategy. Risk, benefits, and alternatives to EP study and ablation for afib were discussed. These risks include but are not limited to stroke, bleeding, vascular damage, tamponade, perforation, damage to the esophagus, lungs, phrenic nerve and other structures, pulmonary vein stenosis, worsening renal function, coronary vasospasm  and death.  Discussed potential need for repeat ablation procedures and antiarrhythmic drugs after an initial ablation. The patient understands these risk and wishes to proceed.  We will therefore proceed with catheter ablation at the next available time.  Carto, ICE, anesthesia are requested for the procedure.   We will obtain CT PV protocol prior to the procedure. -Start Xarelto  20 mg once daily in anticipation of catheter ablation.  Stop Plavix  and start aspirin . -Patient has not been taking carvedilol  consistently, either due to twice daily dosing or low blood pressures.  We will stop carvedilol  and start once daily metoprolol  XL 25 mg.  This can be uptitrated as needed. -If he has additional episodes of AF or NSVT on his device between now and time of catheter ablation, then we will start amiodarone as bridge.  # Ventricular tachycardia: All episodes appeared in the setting of atrial fibrillation often with RVR.  Seems to be more of a tachycardia induced tachycardia rather than spontaneous scar mediated VT. - Will attempt to control atrial fibrillation with catheter ablation as this appears to be the nidus for his ventricular arrhythmias.  May need amiodarone in the interim if he continues to have NSVT on monitor. - Continue to monitor closely with ICD. - Check electrolytes today.  # Chronic systolic heart failure secondary to ischemic cardiomyopathy:  # CAD s/p PCI: -Continue GDMT regimen of Farxiga , lisinopril , spironolactone .  Will change  carvedilol  to metoprolol  XL as noted above and uptitrate as tolerated. -Continue close follow-up with HF clinic, Dr. Cherrie.  I will communicate plans for catheter ablation for atrial fibrillation and see if he has any other recommendations.  Follow up with Dr. Kennyth 3 months after ablation.   Signed, Fonda Kennyth, MD

## 2024-03-14 ENCOUNTER — Other Ambulatory Visit (HOSPITAL_COMMUNITY): Payer: Self-pay

## 2024-03-14 ENCOUNTER — Ambulatory Visit: Attending: Cardiology | Admitting: Cardiology

## 2024-03-14 ENCOUNTER — Encounter: Payer: Self-pay | Admitting: Cardiology

## 2024-03-14 ENCOUNTER — Other Ambulatory Visit: Payer: Self-pay

## 2024-03-14 VITALS — BP 109/62 | HR 74 | Ht 72.0 in | Wt 190.0 lb

## 2024-03-14 DIAGNOSIS — Z4502 Encounter for adjustment and management of automatic implantable cardiac defibrillator: Secondary | ICD-10-CM

## 2024-03-14 DIAGNOSIS — D6869 Other thrombophilia: Secondary | ICD-10-CM

## 2024-03-14 DIAGNOSIS — I480 Paroxysmal atrial fibrillation: Secondary | ICD-10-CM

## 2024-03-14 DIAGNOSIS — Z95810 Presence of automatic (implantable) cardiac defibrillator: Secondary | ICD-10-CM

## 2024-03-14 DIAGNOSIS — I2510 Atherosclerotic heart disease of native coronary artery without angina pectoris: Secondary | ICD-10-CM

## 2024-03-14 DIAGNOSIS — I472 Ventricular tachycardia, unspecified: Secondary | ICD-10-CM | POA: Diagnosis not present

## 2024-03-14 DIAGNOSIS — I5022 Chronic systolic (congestive) heart failure: Secondary | ICD-10-CM

## 2024-03-14 LAB — CUP PACEART INCLINIC DEVICE CHECK
Battery Remaining Longevity: 86 mo
Brady Statistic RA Percent Paced: 0.31 %
Brady Statistic RV Percent Paced: 0.13 %
Date Time Interrogation Session: 20250902120532
HighPow Impedance: 67 Ohm
Implantable Lead Connection Status: 753985
Implantable Lead Connection Status: 753985
Implantable Lead Implant Date: 20120201
Implantable Lead Implant Date: 20120201
Implantable Lead Location: 753859
Implantable Lead Location: 753860
Implantable Pulse Generator Implant Date: 20230131
Lead Channel Impedance Value: 387.5 Ohm
Lead Channel Impedance Value: 437.5 Ohm
Lead Channel Pacing Threshold Amplitude: 0.75 V
Lead Channel Pacing Threshold Amplitude: 0.75 V
Lead Channel Pacing Threshold Amplitude: 1.5 V
Lead Channel Pacing Threshold Amplitude: 1.5 V
Lead Channel Pacing Threshold Pulse Width: 0.5 ms
Lead Channel Pacing Threshold Pulse Width: 0.5 ms
Lead Channel Pacing Threshold Pulse Width: 0.7 ms
Lead Channel Pacing Threshold Pulse Width: 0.7 ms
Lead Channel Sensing Intrinsic Amplitude: 11.8 mV
Lead Channel Sensing Intrinsic Amplitude: 5 mV
Lead Channel Setting Pacing Amplitude: 2 V
Lead Channel Setting Pacing Amplitude: 3 V
Lead Channel Setting Pacing Pulse Width: 0.7 ms
Lead Channel Setting Sensing Sensitivity: 0.5 mV
Pulse Gen Serial Number: 111050201
Zone Setting Status: 755011

## 2024-03-14 MED ORDER — METOPROLOL SUCCINATE ER 25 MG PO TB24: 25.0000 mg | ORAL_TABLET | Freq: Every day | ORAL | 3 refills | Status: AC

## 2024-03-14 MED ORDER — RIVAROXABAN 20 MG PO TABS
20.0000 mg | ORAL_TABLET | Freq: Every day | ORAL | 11 refills | Status: AC
  Filled 2024-03-14: qty 30, 30d supply, fill #0
  Filled 2024-04-17: qty 30, 30d supply, fill #1
  Filled 2024-05-24 (×2): qty 30, 30d supply, fill #0
  Filled 2024-06-23 – 2024-07-05 (×3): qty 30, 30d supply, fill #1
  Filled 2024-07-05: qty 30, 30d supply, fill #0
  Filled 2024-08-04: qty 30, 30d supply, fill #1
  Filled 2024-08-10: qty 30, 30d supply, fill #0

## 2024-03-14 NOTE — Patient Instructions (Addendum)
 Medication Instructions:  Your physician has recommended you make the following change in your medication:   1) STOP taking carvedilol   2) START taking metoprolol  succinate (Toprol  XL) 25 mg once daily  3) STOP taking Plavix  (clopidogrel ) 4) START taking Xarelto  20 mg once daily  5) START taking Aspirin  81 mg once daily  *If you need a refill on your cardiac medications before your next appointment, please call your pharmacy*  Testing/Procedures: Ablation Your physician has recommended that you have an ablation. Catheter ablation is a medical procedure used to treat some cardiac arrhythmias (irregular heartbeats). During catheter ablation, a long, thin, flexible tube is put into a blood vessel in your groin (upper thigh), or neck. This tube is called an ablation catheter. It is then guided to your heart through the blood vessel. Radio frequency waves destroy small areas of heart tissue where abnormal heartbeats may cause an arrhythmia to start.   You are scheduled for Atrial Fibrillation Ablation on Friday, September 26 with Dr. Sidra Kitty.Please arrive at the Main Entrance A at [facility]: [address] [place], [place] [address] at 10:30 AM   What To Expect:  Labs: BMET, CBC, Mag Cardiac CT Scan: this will be done about 3-4 weeks prior to your procedure. You will be contacted to schedule this test. After your procedure we recommend no driving for 3 days, no lifting over 10 lbs for 5 days, and no work or strenuous activity for 7 days.  Please contact our office at [phone] if you have any questions.   Follow-Up: We will contact you to schedule your post-procedure appointments.

## 2024-03-19 ENCOUNTER — Ambulatory Visit: Payer: Self-pay | Admitting: Cardiology

## 2024-03-22 NOTE — Telephone Encounter (Signed)
 Transmission received. Presenting rhythm NSR with occasional PVC's. No episodes noted since recent in office device check.  Mychart message sent to advise Pt of transmission.

## 2024-03-22 NOTE — Telephone Encounter (Signed)
Attempted to contact patient. No answer, left message to call back

## 2024-03-23 ENCOUNTER — Other Ambulatory Visit (HOSPITAL_BASED_OUTPATIENT_CLINIC_OR_DEPARTMENT_OTHER): Payer: Self-pay

## 2024-03-23 DIAGNOSIS — I10 Essential (primary) hypertension: Secondary | ICD-10-CM | POA: Diagnosis not present

## 2024-03-23 DIAGNOSIS — I480 Paroxysmal atrial fibrillation: Secondary | ICD-10-CM | POA: Diagnosis not present

## 2024-03-23 DIAGNOSIS — Z1389 Encounter for screening for other disorder: Secondary | ICD-10-CM | POA: Diagnosis not present

## 2024-03-23 LAB — LAB REPORT - SCANNED: EGFR: 51

## 2024-03-27 ENCOUNTER — Telehealth: Payer: Self-pay

## 2024-03-27 NOTE — Telephone Encounter (Signed)
 Patient calls in and states he was to be scheduled for an AF ablation and to have a CT scan completed before hand.   He has not received the appointment for either and would like the CT scan expedited so that he can get this procedure done as soon as possible. Says he contacted the department for the CT and they say they have not received the order yet.   Would like follow up on this.   Forwarding to our scheduling team.

## 2024-03-28 ENCOUNTER — Other Ambulatory Visit: Payer: Self-pay

## 2024-03-28 DIAGNOSIS — I5043 Acute on chronic combined systolic (congestive) and diastolic (congestive) heart failure: Secondary | ICD-10-CM

## 2024-03-28 DIAGNOSIS — I472 Ventricular tachycardia, unspecified: Secondary | ICD-10-CM

## 2024-03-28 NOTE — Telephone Encounter (Signed)
 LM for pt to call back - Informed him on the message that his procedure has been scheduled for 9/26 at 12:30 pm with Dr. Kennyth - The Imaging Dept has tried to contact him to schedule his CT and had to leave a message but he has not returned the call yet.   Advised pt on his VM that he would need to call back ASAP to be sure he could get scheduled for a CT prior to his Ablation or it would need to be pushed out further.   Also, informed him that he will need to get additional labs the day he comes in for his CT scan. We received a fax from his PCP but we need additional labs.

## 2024-03-29 ENCOUNTER — Telehealth: Payer: Self-pay

## 2024-03-29 NOTE — Telephone Encounter (Signed)
 Tried to call pt to inform him that I have scheduled his CT for this Friday based off of his last MyChart response. He didn't answer and his VM is full so I couldn't leave a message.   I will fwd this message to our EP Navigator and Dr. Shaune RN since I will be out of the office on Thursday and Friday.  If you all could, please try to get in touch with him about his CT on Friday.

## 2024-03-30 NOTE — Telephone Encounter (Signed)
 Attempted to reach patient to discuss upcoming procedure, no answer. Was able to leave a detailed message on VM regarding CT scan date/time/location/medication instructions/NPO 1 hour prior to test. Instructions have been also been previously sent via MyChart. Return contact number provided.

## 2024-03-30 NOTE — Telephone Encounter (Signed)
 Attempted to reach patient to discuss upcoming procedure and provide CT appointment details/instructions, no answer. Unable to leave message- VM full.   Call placed to spouse, [name]/DPR on file. No answer. Left detailed message, requesting a return call from either herself or patient.

## 2024-03-31 ENCOUNTER — Ambulatory Visit (HOSPITAL_COMMUNITY)

## 2024-03-31 NOTE — Telephone Encounter (Signed)
 Patient called CT department and rescheduled CT to 9/23

## 2024-03-31 NOTE — Telephone Encounter (Signed)
 Attempted to reach patient again, no answer. Left a detailed VM and requested a return call.

## 2024-04-04 ENCOUNTER — Ambulatory Visit (HOSPITAL_COMMUNITY)
Admission: RE | Admit: 2024-04-04 | Discharge: 2024-04-04 | Disposition: A | Discharge status: Home / Self Care | Source: Ambulatory Visit | Attending: Cardiology

## 2024-04-04 DIAGNOSIS — I700 Atherosclerosis of aorta: Secondary | ICD-10-CM | POA: Diagnosis not present

## 2024-04-04 DIAGNOSIS — J9611 Chronic respiratory failure with hypoxia: Secondary | ICD-10-CM | POA: Diagnosis not present

## 2024-04-04 DIAGNOSIS — I480 Paroxysmal atrial fibrillation: Secondary | ICD-10-CM | POA: Insufficient documentation

## 2024-04-04 MED ORDER — IOHEXOL 350 MG/ML SOLN
80.0000 mL | Freq: Once | INTRAVENOUS | Status: AC | PRN
  Administered 2024-04-04: 80 mL via INTRAVENOUS

## 2024-04-05 ENCOUNTER — Encounter (HOSPITAL_COMMUNITY): Payer: Self-pay

## 2024-04-05 LAB — BASIC METABOLIC PANEL WITH GFR
BUN/Creatinine Ratio: 12 (ref 10–24)
BUN: 15 mg/dL (ref 8–27)
CO2: 30 mmol/L — ABNORMAL HIGH (ref 20–29)
Calcium: 9.2 mg/dL (ref 8.6–10.2)
Chloride: 94 mmol/L — ABNORMAL LOW (ref 96–106)
Creatinine, Ser: 1.29 mg/dL — ABNORMAL HIGH (ref 0.76–1.27)
Glucose: 104 mg/dL — ABNORMAL HIGH (ref 70–99)
Potassium: 3.2 mmol/L — ABNORMAL LOW (ref 3.5–5.2)
Sodium: 141 mmol/L (ref 134–144)
eGFR: 62 mL/min/1.73 (ref >59)

## 2024-04-05 LAB — PRO B NATRIURETIC PEPTIDE: NT-Pro BNP: 1059 pg/mL — ABNORMAL HIGH (ref 0–210)

## 2024-04-05 LAB — MAGNESIUM: Magnesium: 1.7 mg/dL (ref 1.6–2.3)

## 2024-04-06 ENCOUNTER — Telehealth (HOSPITAL_COMMUNITY): Payer: Self-pay

## 2024-04-06 ENCOUNTER — Telehealth (HOSPITAL_COMMUNITY): Payer: Self-pay | Admitting: Cardiology

## 2024-04-06 MED ORDER — AMIODARONE HCL 200 MG PO TABS
200.0000 mg | ORAL_TABLET | Freq: Two times a day (BID) | ORAL | 0 refills | Status: DC
Start: 2024-04-06 — End: 2024-04-20

## 2024-04-06 NOTE — Telephone Encounter (Signed)
 Left message for patient to call back

## 2024-04-06 NOTE — Telephone Encounter (Signed)
 Pt returned call Appt scheduled 10/3

## 2024-04-06 NOTE — Telephone Encounter (Signed)
-----   Message from Nurse Emer C sent at 04/06/2024 11:23 AM EDT ----- I have left him a message to call the office back so we can get him scheduled ----- Message ----- From: Chauvigne, Carlyle, RN Sent: 04/06/2024  11:02 AM EDT To: Powell CHRISTELLA Latino, RN; [facility]  Good Morning,  Patient was scheduled for an ablation tomorrow, however we are having to cancel due to pre-procedure labs showing decompensated heart failure. Patient sees Dr. Bensimhon. Dr. Kennyth is requesting close follow up with heart failure clinic team for optimization before we reschedule his ablation. Could you help with getting him an appointment?   Thanks! Carly

## 2024-04-06 NOTE — Progress Notes (Signed)
 Advanced Heart Failure Clinic Note  Primary Care: Dr Jakie HF Cardiologist: Dr. Cherrie  HPI: [name] is a 63 y.o. 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 Jude ICD implantation. Was enrolled in [employer].    Has been followed recently by Dr. Sharl at [facility]. Underwent cath at [facility] in 3/15 and had 2.5x55mm Xience DES placed in LAD. Had cath 12/15 here with stable CAD. Repeat cath set up in 10/18 but he cancelled.    Echo (8/19): EF 30-35% (stable) mild RV dilation.  Echo 07/18/19 EF 40-45% mild MR   Lost to follow up.  Admitted 10/23 with overdose and NSTEMI.  Had recent 8-week stay at rehab, returned home and found unresponsive. Given narcan  in field. Required ICU monitoring and Narcan  gtt. Treated for aspiration PNA and AKI. HS Trops elevated, cardiology consulted felt to be type II NSTEMI in setting of drug overdose with no recommended ischemic evaluation. Echo showed EF 25-30%. LHC deferred as reduced EF felt to be related to overdose. General cardiology consulted and GDMT titrated, but limited by AKI. Patient left AMA, weight 235 lbs.  Echo 09/17/22  EF 35-40% RV mod reduced.  Follow up 3/25 for pre-op cardiac evaluation. Saw Dr. Debby in [facility]. Back MRI with severe generalized lumbar spine degeneration with scoliosis, multilevel listhesis, and discogenic edema + spinal stenosis. Plan to update echo and get PET perfusion scan. If testing not high risk, will grant clearance.   Saw EP 6/25, multiple episodes of VT, on 03/02/24 had AF with RVR for several hours, which devolved into VT and VF, with unsuccessful ATP and shock x 1. Coreg  switched to Toprol , started on Xarelto  (Plavix  and ASA stopped) and planned for AF ablation.  Planned for AF ablation but procedure  cancelled after labs suggested hypervolemia.   Today he returns for HF follow up  with his partner. Overall feeling fair. Breathing is labored. Does not check weight at home. Walks short distances around his house. Limited by back pain. Feels palpitations. Denies abnormal bleeding, CP, dizziness, edema, or PND/Orthopnea. Appetite ok. Taking all medications. Smoking < 1ppd, no ETOH, no drugs. Drinks > 2L/fluid daily. Smokes THC.  Cardiac Studies - Echo (10/23): EF 25-30%, grade I DD, RV mildly reduced  - Echo (1/5/2):1 EF 40-45% mild MR  - Echo (2019): EF 30-35%  - Cath at [facility] (12/15): stable CAD  - Cath at [facility] (3/15): 2.5x79mm Xience DES placed in LAD.   Past Medical History:  Diagnosis Date   Anxiety    Automatic implantable cardiac defibrillator St Judes    Analyze ST   Benign prostatic hypertrophy    Benzodiazepine dependence (HCC)    chronic   CAD (coronary artery disease)    Last cath 2/12. 3-v CAD. Failed PCI of distal RCAc  CATH [facility] 4/13 with DES to  LAD X2   CHF (congestive heart failure) (HCC)    Chronic back pain    lumbar stenosis   Chronic systolic heart failure (HCC)    EF 20-25%. s/p ST. Jude ICD   Depression    DJD (degenerative joint disease)    History of testicular cancer    Ischemic cardiomyopathy    Myocardial infarction [facility])    Narcotic dependence (HCC)    chronic   OSA on CPAP  Polycythemia, secondary    S/p hematology consultation for polycythema on 01/06/18.  S/p bone marrow biopsy on 12/31/17 that was negative for myeloproliferative disorder.  Feels secondary to sleep apnea, active smoking, testosterone  supplementation, nocturnal hypoxia   Substance abuse (HCC)    TIA (transient ischemic attack)    Vitamin B12 deficiency 07/22/2016   Current Outpatient Medications  Medication Sig Dispense Refill   acetaminophen  (TYLENOL ) 325 MG tablet      AFRIN 12 HOUR 0.05 % nasal spray Place 1 spray into both nostrils 2 (two) times daily as needed for  congestion.     aspirin  EC 81 MG tablet Take 81 mg by mouth in the morning. Swallow whole.     dapagliflozin  propanediol (FARXIGA ) 10 MG TABS tablet Take 1 tablet (10 mg total) by mouth daily before breakfast. 30 tablet 5   famotidine  (PEPCID ) 20 MG tablet      lisinopril  (ZESTRIL ) 10 MG tablet Take 1 tablet (10 mg total) by mouth daily. 90 tablet 3   LORazepam (ATIVAN) 2 MG tablet Take 2 mg by mouth daily. 1 mg in the evening     metoprolol  succinate (TOPROL  XL) 25 MG 24 hr tablet Take 1 tablet (25 mg total) by mouth daily. 90 tablet 3   naloxone  (NARCAN ) nasal spray 4 mg/0.1 mL Place 1 spray into the nose once as needed (for opioid emergency). 1 each 2   nitroGLYCERIN  (NITROSTAT ) 0.4 MG SL tablet Place 1 tablet (0.4 mg total) under the tongue every 5 (five) minutes as needed for chest pain. 90 tablet 3   ondansetron  (ZOFRAN ) 4 MG tablet Take 4 mg by mouth every 8 (eight) hours as needed.     ondansetron  (ZOFRAN -ODT) 4 MG disintegrating tablet      pantoprazole  (PROTONIX ) 40 MG tablet Take by mouth.     PEPCID  COMPLETE 10-800-165 MG chewable tablet Chew 1 tablet by mouth daily as needed (for reflux).     potassium chloride  SA (KLOR-CON  M) 20 MEQ tablet Take 1 tablet (20 mEq total) by mouth daily. 90 tablet 3   rivaroxaban  (XARELTO ) 20 MG TABS tablet Take 1 tablet (20 mg total) by mouth daily with supper. 30 tablet 11   sildenafil  (VIAGRA ) 100 MG tablet Take 50 mg by mouth daily as needed.     SOMA 250 MG tablet      spironolactone  (ALDACTONE ) 25 MG tablet Take 1 tablet (25 mg total) by mouth daily. 30 tablet 3   tadalafil  (CIALIS ) 20 MG tablet Take 10 mg by mouth daily as needed for erectile dysfunction.     tamsulosin  (FLOMAX ) 0.4 MG CAPS capsule Take 1 capsule (0.4 mg total) by mouth daily after breakfast. 90 capsule 0   Testosterone  Cypionate 200 MG/ML KIT Inject 200 mg into the muscle every 14 (fourteen) days. 2 kit 0   torsemide  (DEMADEX ) 20 MG tablet Take 1 tablet (20 mg total) by mouth  daily. May take an extra 10 mg (1/2 tablet) as needed for additional swelling in legs 45 tablet 3   traZODone  (DESYREL ) 100 MG tablet Take 200 mg by mouth at bedtime.     amiodarone  (PACERONE ) 200 MG tablet Take 1 tablet (200 mg total) by mouth 2 (two) times daily. (Patient not taking: Reported on 04/07/2024) 28 tablet 0   ANORO ELLIPTA 62.5-25 MCG/ACT AEPB Inhale 1 puff into the lungs daily. (Patient not taking: Reported on 04/07/2024)     No current facility-administered medications for this encounter.   Allergies  Allergen Reactions  Darvocet [Propoxyphene N-Acetaminophen ] Anaphylaxis    Throat closes   Depakote [Divalproex Sodium] Other (See Comments)    Coded- ended up on a vent   Haldol [Haloperidol] Other (See Comments)    Coded- ended up on a vent   Propoxyphene Anaphylaxis and Swelling   Social History   Socioeconomic History   Marital status: Divorced    Spouse name: Not on file   Number of children: 4   Years of education: Not on file   Highest education level: Not on file  Occupational History   Not on file  Tobacco Use   Smoking status: Every Day    Current packs/day: 0.00    Average packs/day: 0.5 packs/day for 29.0 years (14.5 ttl pk-yrs)    Types: Cigarettes, E-cigarettes    Start date: 07/13/1986    Last attempt to quit: 07/14/2015    Years since quitting: 8.7   Smokeless tobacco: Never   Tobacco comments:    no cigarette in 4 days but yes to e-cig  Vaping Use   Vaping status: Every Day   Start date: 07/14/2015  Substance and Sexual Activity   Alcohol  use: No    Alcohol /week: 0.0 standard drinks of alcohol    Drug use: Yes    Types: Marijuana    Comment: Urine showed THC   Sexual activity: Yes    Partners: Male  Other Topics Concern   Not on file  Social History Narrative   Marital status: married x 12 years; wife is severe alcoholic.      Children: 3 married daughters (23, 25, 23); 1 son (10yo); 3 grandchildren born in 2017      Lives: with wife, son       Left-handed   Caffeine: 3 drinks per day   Social Drivers of Health   Financial Resource Strain: Not on file  Food Insecurity: No Food Insecurity (05/04/2022)   Hunger Vital Sign    Worried About Running Out of Food in the Last Year: Never true    Ran Out of Food in the Last Year: Never true  Transportation Needs: No Transportation Needs (05/04/2022)   PRAPARE - Administrator, Civil Service (Medical): No    Lack of Transportation (Non-Medical): No  Physical Activity: Not on file  Stress: Not on file  Social Connections: Not on file  Intimate Partner Violence: Not At Risk (05/04/2022)   Humiliation, Afraid, Rape, and Kick questionnaire    Fear of Current or Ex-Partner: No    Emotionally Abused: No    Physically Abused: No    Sexually Abused: No   Family History  Problem Relation Age of Onset   Heart disease Father 91       Died of MI   Cancer Mother    BP 100/60   Pulse 70   Wt 92.5 kg (204 lb)   SpO2 97%   BMI 27.67 kg/m   Wt Readings from Last 3 Encounters:  04/07/24 92.5 kg (204 lb)  03/14/24 86.2 kg (190 lb)  09/14/23 87.6 kg (193 lb 3.2 oz)   PHYSICAL EXAM: General:  NAD. No resp difficulty, arrived in [facility], chronically-ill appearing HEENT: Normal Neck: Supple. JVP to jaw Cor: Regular rate & rhythm. No rubs, gallops or murmurs. Lungs: Clear, diminished in basese Abdomen: Soft, nontender, nondistended.  Extremities: No cyanosis, clubbing, rash, edema Neuro: Alert & oriented x 3, moves all 4 extremities, RLE atrophy. Affect pleasant.  Device interrogation (personally reviewed): Daily impedence looks OK, <1% AF  burden, no recent VT   ReDs reading: 51%, abnormal  ASSESSMENT & PLAN:  1. Chronic Systolic Heart Failure - iCM - Echo (8/19): EF 30-35%.  - S/p STJ ICD - Echo (07/18/19): EF 40-45% - Echo (10/23): EF 25-30%, grade I DD, RV mildly reduced, felt to be newly reduced due to overdose. - Echo (3/24): EF 35-40% RV mildly down - NYHA III,  limited mostly by back pain. Volume up on exam and ReDs 51%, CorVue looks ok - Increase torsemide  to 40 mg bid, increase KCL to 20 daily. - Continue Toprol  XL 25 mg daily - Continue  Farxiga  10 mg daily. - Continue lisinopril  10 mg daily. - Continue spironolactone  25 mg daily - Has repeat echo - Labs today, repeat at close follow up  2. AF - Episodes becoming more frequent - Had AF with RVR, devolved into VT/VF=>ATP and shock - Follows with EP. - Planning AF ablation, needs optimization. - Continue amiodarone  200 mg bid, as bridge until ablation. - Continue Xarelto  20 mg daily. No bleeding issues  3. CAD with ischemic CM - Stable by cath 2015. - Recent type II NSTEMI 2/2 overdose. - No chest pain - Off Plavix , on ASA (with need for [facility]) - Continue statin. - Pet perfusion has been ordered, not completed  4. Chronic respiratory failure - Uses oxygen  at night.  5 Tobacco use - Continues to smoke. - Encouraged cessation   6. Polycythemia  - Followed by Dr. Etha at [facility], felt to be due to chronic hypoxia from OSA.  - Seen by Dr. Amadeo, who agrees.  7. Spinal stenosis - Followed by [facility] - Has not had surgery yet  Follow up in 2 weeks with APP for fluid check.   Harlene HERO [facility] FNP-BC  Advanced Heart Failure 04/07/24

## 2024-04-06 NOTE — Telephone Encounter (Signed)
 Called pt to f/u, he states he has been using oxygen  off/on, he does not feel like he has any edema and has been doing PT, he does notice increased SOB at times. He is taking Torsemide  20 mg Daily, per chat pt can take an extra 1/2 tab (10 mg) as needed, advised pt ok to try this to see if it helps SOB. Appt resch to [facility] 9/26

## 2024-04-06 NOTE — Telephone Encounter (Signed)
 Called to confirm/remind patient of their appointment at the Advanced Heart Failure Clinic on 04/07/24.   Appointment:   [x] Confirmed  [] Left mess   [] No answer/No voice mail  [] VM Full/unable to leave message  [] Phone not in service  Patient reminded to bring all medications and/or complete list.  Confirmed patient has transportation. Gave directions, instructed to utilize valet parking.

## 2024-04-07 ENCOUNTER — Ambulatory Visit (HOSPITAL_COMMUNITY): Payer: Self-pay | Admitting: Family Medicine

## 2024-04-07 ENCOUNTER — Encounter (HOSPITAL_COMMUNITY): Admission: RE | Payer: Self-pay | Source: Home / Self Care

## 2024-04-07 ENCOUNTER — Ambulatory Visit (HOSPITAL_COMMUNITY): Admission: RE | Admit: 2024-04-07 | Source: Home / Self Care | Admitting: Cardiology

## 2024-04-07 ENCOUNTER — Encounter (HOSPITAL_COMMUNITY): Payer: Self-pay

## 2024-04-07 ENCOUNTER — Ambulatory Visit (HOSPITAL_COMMUNITY)
Admission: RE | Admit: 2024-04-07 | Discharge: 2024-04-07 | Disposition: A | Discharge status: Home / Self Care | Source: Ambulatory Visit | Attending: Family Medicine | Admitting: Family Medicine

## 2024-04-07 VITALS — BP 100/60 | HR 70 | Wt 204.0 lb

## 2024-04-07 DIAGNOSIS — I5022 Chronic systolic (congestive) heart failure: Secondary | ICD-10-CM | POA: Insufficient documentation

## 2024-04-07 DIAGNOSIS — I4891 Unspecified atrial fibrillation: Secondary | ICD-10-CM | POA: Diagnosis not present

## 2024-04-07 DIAGNOSIS — Z955 Presence of coronary angioplasty implant and graft: Secondary | ICD-10-CM | POA: Insufficient documentation

## 2024-04-07 DIAGNOSIS — Z7984 Long term (current) use of oral hypoglycemic drugs: Secondary | ICD-10-CM | POA: Diagnosis not present

## 2024-04-07 DIAGNOSIS — J9610 Chronic respiratory failure, unspecified whether with hypoxia or hypercapnia: Secondary | ICD-10-CM | POA: Diagnosis not present

## 2024-04-07 DIAGNOSIS — M48061 Spinal stenosis, lumbar region without neurogenic claudication: Secondary | ICD-10-CM | POA: Insufficient documentation

## 2024-04-07 DIAGNOSIS — I255 Ischemic cardiomyopathy: Secondary | ICD-10-CM | POA: Insufficient documentation

## 2024-04-07 DIAGNOSIS — F17210 Nicotine dependence, cigarettes, uncomplicated: Secondary | ICD-10-CM | POA: Insufficient documentation

## 2024-04-07 DIAGNOSIS — M51362 Other intervertebral disc degeneration, lumbar region with discogenic back pain and lower extremity pain: Secondary | ICD-10-CM | POA: Diagnosis not present

## 2024-04-07 DIAGNOSIS — D45 Polycythemia vera: Secondary | ICD-10-CM | POA: Diagnosis not present

## 2024-04-07 DIAGNOSIS — F32A Depression, unspecified: Secondary | ICD-10-CM | POA: Insufficient documentation

## 2024-04-07 DIAGNOSIS — I252 Old myocardial infarction: Secondary | ICD-10-CM | POA: Insufficient documentation

## 2024-04-07 DIAGNOSIS — Z95810 Presence of automatic (implantable) cardiac defibrillator: Secondary | ICD-10-CM | POA: Diagnosis not present

## 2024-04-07 DIAGNOSIS — I480 Paroxysmal atrial fibrillation: Secondary | ICD-10-CM | POA: Diagnosis not present

## 2024-04-07 DIAGNOSIS — G4733 Obstructive sleep apnea (adult) (pediatric): Secondary | ICD-10-CM | POA: Insufficient documentation

## 2024-04-07 DIAGNOSIS — I2510 Atherosclerotic heart disease of native coronary artery without angina pectoris: Secondary | ICD-10-CM | POA: Diagnosis not present

## 2024-04-07 DIAGNOSIS — Z7901 Long term (current) use of anticoagulants: Secondary | ICD-10-CM | POA: Insufficient documentation

## 2024-04-07 DIAGNOSIS — Z720 Tobacco use: Secondary | ICD-10-CM

## 2024-04-07 DIAGNOSIS — G8929 Other chronic pain: Secondary | ICD-10-CM | POA: Diagnosis not present

## 2024-04-07 DIAGNOSIS — M4800 Spinal stenosis, site unspecified: Secondary | ICD-10-CM

## 2024-04-07 DIAGNOSIS — D751 Secondary polycythemia: Secondary | ICD-10-CM

## 2024-04-07 LAB — BASIC METABOLIC PANEL WITH GFR
Anion gap: 10 (ref 5–15)
BUN: 14 mg/dL (ref 8–23)
CO2: 32 mmol/L (ref 22–32)
Calcium: 8.4 mg/dL — ABNORMAL LOW (ref 8.9–10.3)
Chloride: 96 mmol/L — ABNORMAL LOW (ref 98–111)
Creatinine, Ser: 1.34 mg/dL — ABNORMAL HIGH (ref 0.61–1.24)
GFR, Estimated: 59 mL/min — ABNORMAL LOW (ref >60)
Glucose, Bld: 133 mg/dL — ABNORMAL HIGH (ref 70–99)
Potassium: 2.6 mmol/L — CL (ref 3.5–5.1)
Sodium: 138 mmol/L (ref 135–145)

## 2024-04-07 LAB — BRAIN NATRIURETIC PEPTIDE: B Natriuretic Peptide: 422.2 pg/mL — ABNORMAL HIGH (ref 0.0–100.0)

## 2024-04-07 SURGERY — ATRIAL FIBRILLATION ABLATION
Anesthesia: General

## 2024-04-07 MED ORDER — POTASSIUM CHLORIDE CRYS ER 20 MEQ PO TBCR: 60.0000 meq | EXTENDED_RELEASE_TABLET | Freq: Every day | ORAL | Status: AC

## 2024-04-07 MED ORDER — TORSEMIDE 20 MG PO TABS: 40.0000 mg | ORAL_TABLET | Freq: Every day | ORAL | 3 refills | Status: DC

## 2024-04-07 MED ORDER — POTASSIUM CHLORIDE CRYS ER 20 MEQ PO TBCR: 40.0000 meq | EXTENDED_RELEASE_TABLET | Freq: Every day | ORAL | 3 refills | Status: DC

## 2024-04-07 NOTE — Patient Instructions (Addendum)
 INCREASE Torsemide  to 40 mg daily.  INCREASE Potassium to 40 mEq ( 2 tabs) daily.  Labs done today, your results will be available in MyChart, we will contact you for abnormal readings.  Do the following things EVERYDAY: Weigh yourself in the morning before breakfast. Write it down and keep it in a log. Take your medicines as prescribed Eat low salt foods--Limit salt (sodium) to 2000 mg per day.  Stay as active as you can everyday Limit all fluids for the day to less than 2 liters  Your physician recommends that you schedule a follow-up appointment as scheduled.  If you have any questions or concerns before your next appointment please send us  a message through [place] or call our office at [phone].    TO LEAVE A MESSAGE FOR THE NURSE SELECT OPTION 2, PLEASE LEAVE A MESSAGE INCLUDING: YOUR NAME DATE OF BIRTH CALL BACK NUMBER REASON FOR CALL**this is important as we prioritize the call backs  YOU WILL RECEIVE A CALL BACK THE SAME DAY AS LONG AS YOU CALL BEFORE 4:00 PM  At the Advanced Heart Failure Clinic, you and your health needs are our priority. As part of our continuing mission to provide you with exceptional heart care, we have created designated Provider Care Teams. These Care Teams include your primary Cardiologist (physician) and Advanced Practice Providers (APPs- Physician Assistants and Nurse Practitioners) who all work together to provide you with the care you need, when you need it.   You may see any of the following providers on your designated Care Team at your next follow up: Dr Toribio Fuel Dr Ezra Shuck Dr. Ria Commander Dr. Morene Brownie Amy Lenetta, NP Caffie Shed, [place] [facility], [place] Beckey Coe, NP [place] Lee, NP Ellouise Class, NP Tinnie Redman, PharmD Jaun Bash, PharmD   Please be sure to bring in all your medications bottles to every appointment.    Thank you for choosing [facility]

## 2024-04-07 NOTE — Progress Notes (Signed)
 ReDS Vest / Clip - 04/07/24 1000       ReDS Vest / Clip   Station Marker D    Ruler Value 33    ReDS Value Range High volume overload    ReDS Actual Value 51

## 2024-04-08 ENCOUNTER — Telehealth: Payer: Self-pay | Admitting: Physician Assistant

## 2024-04-08 NOTE — Telephone Encounter (Signed)
 Pt called to clarify potassium dosing.   Per Harlene Gainer: K is dangerously low.    Increase KCL to 80 bid x 3 days then 60 KCL daily thereafter.   Repeat BMET on Monday or Tues.  I suggested continuation of 80 mEq potassium daily x 3 days then 60 mEq potassium daily until follow up.   He is concerned that he is not urinating as much with 50 mg torsemide  - I reviewed recommendation for 40 mg torsemide  BID. I suggested ER evaluation if his urination did not increase to check for retention. May also need to rule out AKI. He expressed understanding.  Had a brief hold at the end of the conversation, returned to phone call and call was dropped.

## 2024-04-09 ENCOUNTER — Ambulatory Visit: Payer: Self-pay | Admitting: Cardiology

## 2024-04-10 DIAGNOSIS — F4322 Adjustment disorder with anxiety: Secondary | ICD-10-CM | POA: Diagnosis not present

## 2024-04-11 ENCOUNTER — Encounter: Payer: Self-pay | Admitting: Cardiology

## 2024-04-11 ENCOUNTER — Ambulatory Visit (HOSPITAL_COMMUNITY): Payer: Self-pay | Admitting: Family Medicine

## 2024-04-11 ENCOUNTER — Ambulatory Visit (HOSPITAL_COMMUNITY)
Admission: RE | Admit: 2024-04-11 | Discharge: 2024-04-11 | Disposition: A | Discharge status: Home / Self Care | Source: Ambulatory Visit | Attending: Cardiology | Admitting: Cardiology

## 2024-04-11 DIAGNOSIS — I5022 Chronic systolic (congestive) heart failure: Secondary | ICD-10-CM | POA: Diagnosis not present

## 2024-04-11 LAB — BASIC METABOLIC PANEL WITH GFR
Anion gap: 14 (ref 5–15)
BUN: 15 mg/dL (ref 8–23)
CO2: 30 mmol/L (ref 22–32)
Calcium: 8.5 mg/dL — ABNORMAL LOW (ref 8.9–10.3)
Chloride: 94 mmol/L — ABNORMAL LOW (ref 98–111)
Creatinine, Ser: 1.36 mg/dL — ABNORMAL HIGH (ref 0.61–1.24)
GFR, Estimated: 58 mL/min — ABNORMAL LOW (ref >60)
Glucose, Bld: 149 mg/dL — ABNORMAL HIGH (ref 70–99)
Potassium: 3.3 mmol/L — ABNORMAL LOW (ref 3.5–5.1)
Sodium: 138 mmol/L (ref 135–145)

## 2024-04-11 MED ORDER — MAGNESIUM 400 MG PO CAPS: 400.0000 mg | ORAL_CAPSULE | Freq: Every day | ORAL | 3 refills | Status: DC

## 2024-04-12 NOTE — Progress Notes (Signed)
 Remote ICD Transmission

## 2024-04-14 ENCOUNTER — Encounter (HOSPITAL_COMMUNITY)

## 2024-04-17 ENCOUNTER — Other Ambulatory Visit (HOSPITAL_COMMUNITY): Payer: Self-pay

## 2024-04-17 ENCOUNTER — Encounter: Payer: Self-pay | Admitting: Family Medicine

## 2024-04-17 MED ORDER — UMECLIDINIUM-VILANTEROL 62.5-25 MCG/ACT IN AEPB
1.0000 | INHALATION_SPRAY | Freq: Every day | RESPIRATORY_TRACT | 3 refills | Status: AC
  Filled 2024-04-17: qty 60, 30d supply, fill #0

## 2024-04-18 ENCOUNTER — Other Ambulatory Visit: Payer: Self-pay

## 2024-04-19 NOTE — Progress Notes (Signed)
 Advanced Heart Failure Clinic Note  Primary Care: [name] HF Cardiologist: [name]  HPI: [name] is a 63 y.o. 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 Jude ICD implantation. Was enrolled in [employer].    Has been followed recently by [name] at [facility]. Underwent cath at [facility] in 3/15 and had 2.5x3mm Xience DES placed in LAD. Had cath 12/15 here with stable CAD. Repeat cath set up in 10/18 but he cancelled.    Echo (8/19): EF 30-35% (stable) mild RV dilation.  Echo 07/18/19 EF 40-45% mild MR   Lost to follow up.  Admitted 10/23 with overdose and NSTEMI.  Had recent 8-week stay at rehab, returned home and found unresponsive. Given narcan  in field. Required ICU monitoring and Narcan  gtt. Treated for aspiration PNA and AKI. HS Trops elevated, cardiology consulted felt to be type II NSTEMI in setting of drug overdose with no recommended ischemic evaluation. Echo showed EF 25-30%. LHC deferred as reduced EF felt to be related to overdose. General cardiology consulted and GDMT titrated, but limited by AKI. Patient left AMA, weight 235 lbs.  Echo 09/17/22  EF 35-40% RV mod reduced.  Follow up 3/25 for pre-op cardiac evaluation. Saw [name] in [facility]. Back MRI with severe generalized lumbar spine degeneration with scoliosis, multilevel listhesis, and discogenic edema + spinal stenosis. Plan to update echo and get PET perfusion scan. If testing not high risk, will grant clearance.   Saw EP 6/25, multiple episodes of VT, on 03/02/24 had AF with RVR for several hours, which devolved into VT and VF, with unsuccessful ATP and shock x 1. Coreg  switched to Toprol , started on Xarelto  (Plavix  and ASA stopped) and planned for AF ablation.  Planned for AF ablation but procedure  cancelled after labs suggested hypervolemia.   He returns today for heart failure follow up with his partner. Overall feeling short of breath with relatively any activity. NYHA IIIb-IV. Reports dyspnea and fatigue. Denies chest pain, orthopnea, palpitations, and dizziness. Able to perform ADLs. Appetite okay. Has taken himself off of some of his medications in favor of viagra  and cialis , reports that he is taking them daily due to very frequent sexual activity. Remains smoking <1 ppd and THC.   Cardiac Studies - Echo (3/24); EF 35-40%, G1DD, mildly reduced RV  - Echo (10/23): EF 25-30%, grade I DD, RV mildly reduced - Echo (07/18/19): EF 40-45% mild MR - Echo (2019): EF 30-35% - Cath at [facility] (12/15): stable CAD - Cath at [facility] (3/15): 2.5x40mm Xience DES placed in LAD.   Past Medical History:  Diagnosis Date   Anxiety    Automatic implantable cardiac defibrillator St Judes    Analyze ST   Benign prostatic hypertrophy    Benzodiazepine dependence (HCC)    chronic   CAD (coronary artery disease)    Last cath 2/12. 3-v CAD. Failed PCI of distal RCAc  CATH [facility] 4/13 with DES to  LAD X2   CHF (congestive heart failure) (HCC)    Chronic back pain    lumbar stenosis   Chronic systolic heart failure (HCC)    EF 20-25%. s/p ST. Jude ICD   Depression    DJD (degenerative joint disease)    History of testicular cancer    Ischemic cardiomyopathy  Myocardial infarction [facility])    Narcotic dependence (HCC)    chronic   OSA on CPAP    Polycythemia, secondary    S/p hematology consultation for polycythema on 01/06/18.  S/p bone marrow biopsy on 12/31/17 that was negative for myeloproliferative disorder.  Feels secondary to sleep apnea, active smoking, testosterone  supplementation, nocturnal hypoxia   Substance abuse (HCC)    TIA (transient ischemic attack)    Vitamin B12 deficiency 07/22/2016   Current Outpatient Medications  Medication Sig Dispense Refill   acetaminophen  (TYLENOL ) 325 MG tablet       AFRIN 12 HOUR 0.05 % nasal spray Place 1 spray into both nostrils 2 (two) times daily as needed for congestion.     amiodarone  (PACERONE ) 200 MG tablet Take 1 tablet (200 mg total) by mouth 2 (two) times daily. 180 tablet 3   ANORO ELLIPTA 62.5-25 MCG/ACT AEPB Inhale 1 puff into the lungs daily.     aspirin  EC 81 MG tablet Take 81 mg by mouth in the morning. Swallow whole.     famotidine  (PEPCID ) 20 MG tablet      LORazepam (ATIVAN) 2 MG tablet Take 2 mg by mouth daily. 1 mg in the evening     Magnesium 400 MG CAPS Take 400 mg by mouth daily. 90 capsule 3   metoprolol  succinate (TOPROL  XL) 25 MG 24 hr tablet Take 1 tablet (25 mg total) by mouth daily. 90 tablet 3   naloxone  (NARCAN ) nasal spray 4 mg/0.1 mL Place 1 spray into the nose once as needed (for opioid emergency). 1 each 2   nitroGLYCERIN  (NITROSTAT ) 0.4 MG SL tablet Place 1 tablet (0.4 mg total) under the tongue every 5 (five) minutes as needed for chest pain. 90 tablet 3   ondansetron  (ZOFRAN ) 4 MG tablet Take 4 mg by mouth every 8 (eight) hours as needed.     ondansetron  (ZOFRAN -ODT) 4 MG disintegrating tablet      pantoprazole  (PROTONIX ) 40 MG tablet Take by mouth.     PEPCID  COMPLETE 10-800-165 MG chewable tablet Chew 1 tablet by mouth daily as needed (for reflux).     potassium chloride  SA (KLOR-CON  M) 20 MEQ tablet Take 3 tablets (60 mEq total) by mouth daily.     rivaroxaban  (XARELTO ) 20 MG TABS tablet Take 1 tablet (20 mg total) by mouth daily with supper. 30 tablet 11   sildenafil  (VIAGRA ) 100 MG tablet Take 50 mg by mouth daily as needed.     SOMA 250 MG tablet      spironolactone  (ALDACTONE ) 25 MG tablet Take 1 tablet (25 mg total) by mouth daily. 30 tablet 3   tadalafil  (CIALIS ) 20 MG tablet Take 10 mg by mouth daily as needed for erectile dysfunction.     tamsulosin  (FLOMAX ) 0.4 MG CAPS capsule Take 1 capsule (0.4 mg total) by mouth daily after breakfast. 90 capsule 0   Testosterone  Cypionate 200 MG/ML KIT Inject 200  mg into the muscle every 14 (fourteen) days. 2 kit 0   traZODone  (DESYREL ) 100 MG tablet Take 200 mg by mouth at bedtime.     umeclidinium-vilanterol (ANORO ELLIPTA) 62.5-25 MCG/ACT AEPB Inhale 1 puff into the lungs daily. 60 each 3   dapagliflozin  propanediol (FARXIGA ) 10 MG TABS tablet Take 1 tablet (10 mg total) by mouth daily before breakfast. 30 tablet 5   lisinopril  (ZESTRIL ) 10 MG tablet Take 1 tablet (10 mg total) by mouth daily. 90 tablet 3   torsemide  (DEMADEX ) 20 MG tablet Take 2  tablets (40 mg total) by mouth daily. May take an extra 40 mg (2 tablet) as needed for additional swelling in legs 90 tablet 3   No current facility-administered medications for this encounter.   Allergies  Allergen Reactions   Darvocet [Propoxyphene N-Acetaminophen ] Anaphylaxis    Throat closes   Depakote [Divalproex Sodium] Other (See Comments)    Coded- ended up on a vent   Haldol [Haloperidol] Other (See Comments)    Coded- ended up on a vent   Propoxyphene Anaphylaxis and Swelling   Social History   Socioeconomic History   Marital status: Divorced    Spouse name: Not on file   Number of children: 4   Years of education: Not on file   Highest education level: Not on file  Occupational History   Not on file  Tobacco Use   Smoking status: Every Day    Current packs/day: 0.00    Average packs/day: 0.5 packs/day for 29.0 years (14.5 ttl pk-yrs)    Types: Cigarettes, E-cigarettes    Start date: 07/13/1986    Last attempt to quit: 07/14/2015    Years since quitting: 8.7   Smokeless tobacco: Never   Tobacco comments:    no cigarette in 4 days but yes to e-cig  Vaping Use   Vaping status: Every Day   Start date: 07/14/2015  Substance and Sexual Activity   Alcohol  use: No    Alcohol /week: 0.0 standard drinks of alcohol    Drug use: Yes    Types: Marijuana    Comment: Urine showed THC   Sexual activity: Yes    Partners: Male  Other Topics Concern   Not on file  Social History Narrative    Marital status: married x 12 years; wife is severe alcoholic.      Children: 3 married daughters (23, 1, 15); 1 son (10yo); 3 grandchildren born in 2017      Lives: with wife, son      Left-handed   Caffeine: 3 drinks per day   Social Drivers of Health   Financial Resource Strain: Not on file  Food Insecurity: No Food Insecurity (05/04/2022)   Hunger Vital Sign    Worried About Running Out of Food in the Last Year: Never true    Ran Out of Food in the Last Year: Never true  Transportation Needs: No Transportation Needs (05/04/2022)   PRAPARE - Administrator, Civil Service (Medical): No    Lack of Transportation (Non-Medical): No  Physical Activity: Not on file  Stress: Not on file  Social Connections: Not on file  Intimate Partner Violence: Not At Risk (05/04/2022)   Humiliation, Afraid, Rape, and Kick questionnaire    Fear of Current or Ex-Partner: No    Emotionally Abused: No    Physically Abused: No    Sexually Abused: No   Family History  Problem Relation Age of Onset   Heart disease Father 35       Died of MI   Cancer Mother    BP 132/76   Pulse 76   Ht 6' (1.829 m)   Wt 91.6 kg (202 lb)   SpO2 94%   BMI 27.40 kg/m   Wt Readings from Last 3 Encounters:  04/24/24 91.6 kg (202 lb)  04/07/24 92.5 kg (204 lb)  03/14/24 86.2 kg (190 lb)   PHYSICAL EXAM: General: Chronically-ill, thin appearing. No distress on RA Cardiac: JVP flat. S1 and S2 present. No murmurs Resp: Lung sounds clear and equal  B/L Extremities: Warm and dry.  No peripheral edema.  Neuro: Alert and oriented x3. Affect skittish. Arrived by [facility].  Device interrogation (personally reviewed): <1% AT/AF burden, 3 episodes. No thoracic impedence, Corvue appears without volume overload (although there are noted gaps in monitoring)  ReDs reading: 50%, abnormal   ASSESSMENT & PLAN:  1. Chronic Systolic Heart Failure - iCM - Echo (8/19): EF 30-35%.  - S/p STJ ICD - Echo (07/18/19): EF  40-45% - Echo (10/23): EF 25-30%, grade I DD, RV mildly reduced, felt to be newly reduced due to overdose. - Echo (3/24): EF 35-40% RV mildly down - NYHA IIIb, limited mostly by deconditioning, does not ambulate much.  - ReDs remains elevated, however exam and Corvue looks okay. Will message device RN about re-calibration - continue torsemide  40 mg qAM + PRN 40 mg qPM - continue Toprol  XL 25 mg daily - restart Farxiga  10 mg daily. - restart lisinopril  10 mg daily. - continue spironolactone  25 mg daily - taking cialis  and viagra  daily for ED. Will get a BP cuff and start taking daily BPs - repeat echo ordered, has not been completed yet  2. AF - Episodes becoming more frequent - Had AF with RVR, devolved into VT/VF>ATP and shock - Follows with EP. - Scheduled for AF ablation, however canceled due to pre-procedure labs concerning for ADHF - Continue amiodarone  200 mg bid, as bridge until ablation. Needs to resume GDMT and reschedule. - Continue Xarelto  20 mg daily. No bleeding issues  3. CAD with ischemic CM - Stable by cath 2015. - Recent type II NSTEMI 2/2 overdose. - No chest pain - Off Plavix , on ASA (with need for [facility]) - Continue statin. - Pet perfusion has been ordered, not completed  4. Chronic respiratory failure - Uses oxygen  at night.  5. Tobacco use - Continues to smoke. - Encouraged cessation   6. Polycythemia  - Followed by Trevor. Etha at [facility], felt to be due to chronic hypoxia from OSA.  - Seen by Trevor. Amadeo, who agrees.  7. Spinal stenosis - Followed by NSG - Has not had surgery yet  Follow up in 2 months with APP  Trevor Andy Moye, NP 04/24/24

## 2024-04-20 ENCOUNTER — Other Ambulatory Visit: Payer: Self-pay

## 2024-04-21 ENCOUNTER — Telehealth (HOSPITAL_COMMUNITY): Payer: Self-pay

## 2024-04-21 MED ORDER — AMIODARONE HCL 200 MG PO TABS: 200.0000 mg | ORAL_TABLET | Freq: Two times a day (BID) | ORAL | 3 refills | Status: DC

## 2024-04-21 NOTE — Telephone Encounter (Signed)
 Pt's medication was sent to pt's pharmacy as requested. Confirmation received.

## 2024-04-21 NOTE — Telephone Encounter (Signed)
 Called to confirm/remind patient of their appointment at the Advanced Heart Failure Clinic on 04/24/24 2:00.   Appointment:   [x] Confirmed  [] Left mess   [] No answer/No voice mail  [] VM Full/unable to leave message  [] Phone not in service  Patient reminded to bring all medications and/or complete list.  Confirmed patient has transportation. Gave directions, instructed to utilize valet parking.

## 2024-04-21 NOTE — Telephone Encounter (Signed)
 Pt's pharmacy is requesting a refill on medication amiodarone  200 mg tablet. Does Dr. Kennyth want pt to continue taking this medication? Please address

## 2024-04-24 ENCOUNTER — Ambulatory Visit (HOSPITAL_COMMUNITY)
Admission: RE | Admit: 2024-04-24 | Discharge: 2024-04-24 | Disposition: A | Discharge status: Home / Self Care | Source: Ambulatory Visit | Attending: Cardiology | Admitting: Cardiology

## 2024-04-24 ENCOUNTER — Ambulatory Visit (HOSPITAL_COMMUNITY): Payer: Self-pay | Admitting: Cardiology

## 2024-04-24 ENCOUNTER — Encounter (HOSPITAL_COMMUNITY): Payer: Self-pay

## 2024-04-24 VITALS — BP 132/76 | HR 76 | Ht 72.0 in | Wt 202.0 lb

## 2024-04-24 DIAGNOSIS — J9610 Chronic respiratory failure, unspecified whether with hypoxia or hypercapnia: Secondary | ICD-10-CM | POA: Diagnosis not present

## 2024-04-24 DIAGNOSIS — G4733 Obstructive sleep apnea (adult) (pediatric): Secondary | ICD-10-CM | POA: Diagnosis not present

## 2024-04-24 DIAGNOSIS — I130 Hypertensive heart and chronic kidney disease with heart failure and stage 1 through stage 4 chronic kidney disease, or unspecified chronic kidney disease: Secondary | ICD-10-CM | POA: Diagnosis not present

## 2024-04-24 DIAGNOSIS — F1290 Cannabis use, unspecified, uncomplicated: Secondary | ICD-10-CM | POA: Diagnosis not present

## 2024-04-24 DIAGNOSIS — I5022 Chronic systolic (congestive) heart failure: Secondary | ICD-10-CM | POA: Diagnosis not present

## 2024-04-24 DIAGNOSIS — Z95810 Presence of automatic (implantable) cardiac defibrillator: Secondary | ICD-10-CM | POA: Diagnosis not present

## 2024-04-24 DIAGNOSIS — M549 Dorsalgia, unspecified: Secondary | ICD-10-CM | POA: Insufficient documentation

## 2024-04-24 DIAGNOSIS — I4891 Unspecified atrial fibrillation: Secondary | ICD-10-CM | POA: Diagnosis not present

## 2024-04-24 DIAGNOSIS — Z720 Tobacco use: Secondary | ICD-10-CM

## 2024-04-24 DIAGNOSIS — G8929 Other chronic pain: Secondary | ICD-10-CM | POA: Diagnosis not present

## 2024-04-24 DIAGNOSIS — F32A Depression, unspecified: Secondary | ICD-10-CM | POA: Insufficient documentation

## 2024-04-24 DIAGNOSIS — Z7901 Long term (current) use of anticoagulants: Secondary | ICD-10-CM | POA: Insufficient documentation

## 2024-04-24 DIAGNOSIS — E1151 Type 2 diabetes mellitus with diabetic peripheral angiopathy without gangrene: Secondary | ICD-10-CM | POA: Diagnosis not present

## 2024-04-24 DIAGNOSIS — E669 Obesity, unspecified: Secondary | ICD-10-CM | POA: Insufficient documentation

## 2024-04-24 DIAGNOSIS — M79606 Pain in leg, unspecified: Secondary | ICD-10-CM | POA: Insufficient documentation

## 2024-04-24 DIAGNOSIS — I2510 Atherosclerotic heart disease of native coronary artery without angina pectoris: Secondary | ICD-10-CM | POA: Insufficient documentation

## 2024-04-24 DIAGNOSIS — Z6827 Body mass index (BMI) 27.0-27.9, adult: Secondary | ICD-10-CM | POA: Insufficient documentation

## 2024-04-24 DIAGNOSIS — Z955 Presence of coronary angioplasty implant and graft: Secondary | ICD-10-CM | POA: Insufficient documentation

## 2024-04-24 DIAGNOSIS — F17210 Nicotine dependence, cigarettes, uncomplicated: Secondary | ICD-10-CM | POA: Diagnosis not present

## 2024-04-24 DIAGNOSIS — Z79899 Other long term (current) drug therapy: Secondary | ICD-10-CM | POA: Insufficient documentation

## 2024-04-24 DIAGNOSIS — M4800 Spinal stenosis, site unspecified: Secondary | ICD-10-CM | POA: Insufficient documentation

## 2024-04-24 DIAGNOSIS — Z7982 Long term (current) use of aspirin: Secondary | ICD-10-CM | POA: Diagnosis not present

## 2024-04-24 DIAGNOSIS — I255 Ischemic cardiomyopathy: Secondary | ICD-10-CM | POA: Diagnosis not present

## 2024-04-24 DIAGNOSIS — I252 Old myocardial infarction: Secondary | ICD-10-CM | POA: Insufficient documentation

## 2024-04-24 DIAGNOSIS — D751 Secondary polycythemia: Secondary | ICD-10-CM

## 2024-04-24 DIAGNOSIS — D45 Polycythemia vera: Secondary | ICD-10-CM | POA: Insufficient documentation

## 2024-04-24 DIAGNOSIS — J989 Respiratory disorder, unspecified: Secondary | ICD-10-CM

## 2024-04-24 DIAGNOSIS — I509 Heart failure, unspecified: Secondary | ICD-10-CM | POA: Diagnosis not present

## 2024-04-24 DIAGNOSIS — Z125 Encounter for screening for malignant neoplasm of prostate: Secondary | ICD-10-CM | POA: Diagnosis not present

## 2024-04-24 DIAGNOSIS — I480 Paroxysmal atrial fibrillation: Secondary | ICD-10-CM

## 2024-04-24 LAB — BASIC METABOLIC PANEL WITH GFR
Anion gap: 9 (ref 5–15)
BUN: 27 mg/dL — ABNORMAL HIGH (ref 8–23)
CO2: 29 mmol/L (ref 22–32)
Calcium: 9 mg/dL (ref 8.9–10.3)
Chloride: 94 mmol/L — ABNORMAL LOW (ref 98–111)
Creatinine, Ser: 1.81 mg/dL — ABNORMAL HIGH (ref 0.61–1.24)
GFR, Estimated: 41 mL/min — ABNORMAL LOW (ref >60)
Glucose, Bld: 113 mg/dL — ABNORMAL HIGH (ref 70–99)
Potassium: 4.3 mmol/L (ref 3.5–5.1)
Sodium: 132 mmol/L — ABNORMAL LOW (ref 135–145)

## 2024-04-24 MED ORDER — DAPAGLIFLOZIN PROPANEDIOL 10 MG PO TABS: 10.0000 mg | ORAL_TABLET | Freq: Every day | ORAL | 5 refills | Status: AC

## 2024-04-24 MED ORDER — LISINOPRIL 10 MG PO TABS: 10.0000 mg | ORAL_TABLET | Freq: Every day | ORAL | 3 refills | Status: AC

## 2024-04-24 MED ORDER — TORSEMIDE 20 MG PO TABS: 40.0000 mg | ORAL_TABLET | Freq: Every day | ORAL | 3 refills | Status: DC

## 2024-04-24 NOTE — Patient Instructions (Addendum)
 Medication Changes:  RESTART FARXIGA    RESTART LISINOPRIL    DECREASE TORSEMIDE  TO 40MG  IN THE MORNING, YOU MAY TAKE AN EXTRA 40MG  IN THE EVENING AS NEEDED FOR SWELLING OR WEIGHT GAIN   Lab Work:  Labs done today, your results will be available in MyChart, we will contact you for abnormal readings.  Special Instructions // Education:  THE NUMBER TO ELECTROPHYSIOLOGY IS [phone]  Follow-Up in: 2 MONTHS AS SCHEDULED WITH APP CLINIC   At the Advanced Heart Failure Clinic, you and your health needs are our priority. We have a designated team specialized in the treatment of Heart Failure. This Care Team includes your primary Heart Failure Specialized Cardiologist (physician), Advanced Practice Providers (APPs- Physician Assistants and Nurse Practitioners), and Pharmacist who all work together to provide you with the care you need, when you need it.   You may see any of the following providers on your designated Care Team at your next follow up:  Dr. Toribio Fuel Dr. Ezra Shuck Dr. Ria Commander Dr. Odis Brownie Greig Mosses, NP Caffie Shed, [place] [facility], [place] Beckey Coe, NP [place] Lee, NP Tinnie Redman, PharmD   Please be sure to bring in all your medications bottles to every appointment.   Need to Contact Us :  If you have any questions or concerns before your next appointment please send us  a message through [place] or call our office at [phone].    TO LEAVE A MESSAGE FOR THE NURSE SELECT OPTION 2, PLEASE LEAVE A MESSAGE INCLUDING: YOUR NAME DATE OF BIRTH CALL BACK NUMBER REASON FOR CALL**this is important as we prioritize the call backs  YOU WILL RECEIVE A CALL BACK THE SAME DAY AS LONG AS YOU CALL BEFORE 4:00 PM

## 2024-04-24 NOTE — Progress Notes (Signed)
 ReDS Vest / Clip - 04/24/24 1416       ReDS Vest / Clip   Station Marker D    Ruler Value 41    ReDS Value Range High volume overload    ReDS Actual Value 50

## 2024-04-25 ENCOUNTER — Telehealth: Payer: Self-pay | Admitting: Cardiology

## 2024-04-25 DIAGNOSIS — R351 Nocturia: Secondary | ICD-10-CM | POA: Diagnosis not present

## 2024-04-25 DIAGNOSIS — N401 Enlarged prostate with lower urinary tract symptoms: Secondary | ICD-10-CM | POA: Diagnosis not present

## 2024-04-25 DIAGNOSIS — E291 Testicular hypofunction: Secondary | ICD-10-CM | POA: Diagnosis not present

## 2024-04-25 DIAGNOSIS — N5201 Erectile dysfunction due to arterial insufficiency: Secondary | ICD-10-CM | POA: Diagnosis not present

## 2024-04-25 NOTE — Telephone Encounter (Signed)
 Patient called in stating that at his visit with [place] Lee, NP yesterday 10/13 - she told him to call in today to get the ablation rescheduled. Patient would like a call back to get that rescheduled.

## 2024-04-26 ENCOUNTER — Ambulatory Visit

## 2024-04-26 ENCOUNTER — Ambulatory Visit: Attending: Cardiovascular Disease | Admitting: *Deleted

## 2024-04-26 DIAGNOSIS — I5022 Chronic systolic (congestive) heart failure: Secondary | ICD-10-CM

## 2024-04-26 NOTE — Telephone Encounter (Signed)
 Left message for patient to call back

## 2024-04-26 NOTE — Patient Instructions (Signed)
 Follow up as scheduled.

## 2024-04-27 ENCOUNTER — Encounter (HOSPITAL_COMMUNITY): Payer: Self-pay

## 2024-04-27 DIAGNOSIS — I5022 Chronic systolic (congestive) heart failure: Secondary | ICD-10-CM

## 2024-04-27 NOTE — Progress Notes (Signed)
 Patient came in today at request of [place] Lee, NP for CORVUE recalibration for gaps noted in data recording. Spoke with [employer]. Jude rep Dorise Regal who said the CORVUE cannot be re-calibrated and the gaps are caused by frequent PVC's or runs of NSVT and the device does not check thoracic impedance during those times. No leads tested. No charge for visit.

## 2024-04-28 MED ORDER — TORSEMIDE 20 MG PO TABS: 60.0000 mg | ORAL_TABLET | Freq: Every day | ORAL | 3 refills | Status: DC

## 2024-05-01 ENCOUNTER — Other Ambulatory Visit (HOSPITAL_COMMUNITY): Payer: Self-pay | Admitting: Internal Medicine

## 2024-05-01 DIAGNOSIS — Z0000 Encounter for general adult medical examination without abnormal findings: Secondary | ICD-10-CM | POA: Diagnosis not present

## 2024-05-01 DIAGNOSIS — I130 Hypertensive heart and chronic kidney disease with heart failure and stage 1 through stage 4 chronic kidney disease, or unspecified chronic kidney disease: Secondary | ICD-10-CM | POA: Diagnosis not present

## 2024-05-01 DIAGNOSIS — Z23 Encounter for immunization: Secondary | ICD-10-CM | POA: Diagnosis not present

## 2024-05-01 DIAGNOSIS — Z1331 Encounter for screening for depression: Secondary | ICD-10-CM | POA: Diagnosis not present

## 2024-05-01 NOTE — Telephone Encounter (Signed)
Patient is returning call to schedule ablation. 

## 2024-05-02 MED ORDER — TORSEMIDE 20 MG PO TABS: ORAL_TABLET | ORAL | Status: DC

## 2024-05-02 NOTE — Telephone Encounter (Addendum)
 Pt aware, agreeable, and verbalized understanding To change torsemide  to 60 mg / 40 mg. Has labs scheduled next week.   ----- Message from [place] Lee sent at 04/24/2024  4:50 PM EDT ----- Creatinine slightly up, decrease lasix  dosing as discussed today in clinic visit. Repeat labs in 10-14 days. ----- Message ----- From: Rebecka, Lab In [place] Sent: 04/24/2024   4:19 PM EDT To: [place] Lee, NP

## 2024-05-02 NOTE — Telephone Encounter (Signed)
 Spoke with the patient and scheduled him for an ablation with Dr. Kennyth on 12/22.

## 2024-05-03 ENCOUNTER — Telehealth (HOSPITAL_COMMUNITY): Payer: Self-pay

## 2024-05-03 NOTE — Telephone Encounter (Signed)
 Patient called to report that the increase in torsemide  is too much for him. He also denies that he ever said that he had a problem with urine output last week.

## 2024-05-03 NOTE — Telephone Encounter (Signed)
 Patient called wanting to know if his ablation could be sooner. Please reach out to patient.

## 2024-05-04 DIAGNOSIS — J449 Chronic obstructive pulmonary disease, unspecified: Secondary | ICD-10-CM | POA: Diagnosis not present

## 2024-05-04 DIAGNOSIS — J9611 Chronic respiratory failure with hypoxia: Secondary | ICD-10-CM | POA: Diagnosis not present

## 2024-05-05 NOTE — Telephone Encounter (Signed)
Pt called to f/u-please advise.

## 2024-05-06 ENCOUNTER — Telehealth: Payer: Self-pay | Admitting: Physician Assistant

## 2024-05-06 NOTE — Telephone Encounter (Signed)
   The patient called the answering service after-hours today. He has noticed every morning he is waking up shaking a little bit. This has been happening ever since he started amiodarone . He took the 8am dose today then noticed later this morning his whole body has been shaking like a leaf. His legs are shaking also. His voice also feels like it is shaking. He feels unsteady. He has not fallen but uses a walker. No fevers, chills or other focal neurologic symptoms. He also reports that he engaged in a long period of sexual activity the other day and felt unwell towards the end. We discussed that given his complex cardiac hx as well as hx of electrolyte disturbance and renal insufficiency, difficult to fully ascertain symptoms over the phone. I reviewed with Dr. Cherrie who suggested trial of reduction of amiodarone  - patient prefers to cut amiodarone  to 100mg  BID rather than 200mg  once daily. Also updated Dr. Kennyth. He will go to ER if diffuse shaking does not stop in next hour. Otherwise he is interested in having his repeat labs drawn sooner than 05/11/24. Will forward to Jordan Lee to assist with these repeat lab orders if patient does not go to ER.  Hosanna Betley N Amaryah Mallen, PA-C

## 2024-05-09 NOTE — Addendum Note (Signed)
 Addended by: Briane Birden, DALTON HERO on: 05/09/2024 01:35 PM   Modules accepted: Orders

## 2024-05-09 NOTE — Telephone Encounter (Signed)
 Please see message above regarding CBC.  Ok to order at upcoming lab appt 10/31?   Pt was already advised to reach back out to original provider and or PCP

## 2024-05-09 NOTE — Telephone Encounter (Signed)
 Attempted to call patient. Unable to leave voicemail.

## 2024-05-10 ENCOUNTER — Ambulatory Visit: Payer: BC Managed Care – PPO

## 2024-05-10 DIAGNOSIS — I255 Ischemic cardiomyopathy: Secondary | ICD-10-CM

## 2024-05-11 ENCOUNTER — Ambulatory Visit (HOSPITAL_COMMUNITY)
Admission: RE | Admit: 2024-05-11 | Discharge: 2024-05-11 | Disposition: A | Discharge status: Home / Self Care | Source: Ambulatory Visit | Attending: Cardiology | Admitting: Cardiology

## 2024-05-11 ENCOUNTER — Telehealth: Payer: Self-pay | Admitting: Cardiology

## 2024-05-11 ENCOUNTER — Encounter: Payer: Self-pay | Admitting: Cardiology

## 2024-05-11 DIAGNOSIS — I5022 Chronic systolic (congestive) heart failure: Secondary | ICD-10-CM | POA: Insufficient documentation

## 2024-05-11 LAB — CUP PACEART REMOTE DEVICE CHECK
Battery Remaining Longevity: 77 mo
Battery Remaining Percentage: 73 %
Battery Voltage: 2.99 V
Brady Statistic AP VP Percent: 1 %
Brady Statistic AP VS Percent: 1 %
Brady Statistic AS VP Percent: 1 %
Brady Statistic AS VS Percent: 97 %
Brady Statistic RA Percent Paced: 1 %
Brady Statistic RV Percent Paced: 1 %
Date Time Interrogation Session: 20251029021904
HighPow Impedance: 72 Ohm
Implantable Lead Connection Status: 753985
Implantable Lead Connection Status: 753985
Implantable Lead Implant Date: 20120201
Implantable Lead Implant Date: 20120201
Implantable Lead Location: 753859
Implantable Lead Location: 753860
Implantable Pulse Generator Implant Date: 20230131
Lead Channel Impedance Value: 440 Ohm
Lead Channel Impedance Value: 450 Ohm
Lead Channel Pacing Threshold Amplitude: 0.75 V
Lead Channel Pacing Threshold Amplitude: 1.5 V
Lead Channel Pacing Threshold Pulse Width: 0.5 ms
Lead Channel Pacing Threshold Pulse Width: 0.7 ms
Lead Channel Sensing Intrinsic Amplitude: 12 mV
Lead Channel Sensing Intrinsic Amplitude: 5 mV
Lead Channel Setting Pacing Amplitude: 2 V
Lead Channel Setting Pacing Amplitude: 3 V
Lead Channel Setting Pacing Pulse Width: 0.7 ms
Lead Channel Setting Sensing Sensitivity: 0.5 mV
Pulse Gen Serial Number: 111050201
Zone Setting Status: 755011

## 2024-05-11 LAB — BASIC METABOLIC PANEL WITH GFR
Anion gap: 10 (ref 5–15)
BUN: 18 mg/dL (ref 8–23)
CO2: 32 mmol/L (ref 22–32)
Calcium: 8.6 mg/dL — ABNORMAL LOW (ref 8.9–10.3)
Chloride: 95 mmol/L — ABNORMAL LOW (ref 98–111)
Creatinine, Ser: 1.5 mg/dL — ABNORMAL HIGH (ref 0.61–1.24)
GFR, Estimated: 52 mL/min — ABNORMAL LOW (ref >60)
Glucose, Bld: 131 mg/dL — ABNORMAL HIGH (ref 70–99)
Potassium: 3.7 mmol/L (ref 3.5–5.1)
Sodium: 137 mmol/L (ref 135–145)

## 2024-05-11 LAB — CBC
HCT: 51 % (ref 39.0–52.0)
Hemoglobin: 15.9 g/dL (ref 13.0–17.0)
MCH: 25 pg — ABNORMAL LOW (ref 26.0–34.0)
MCHC: 31.2 g/dL (ref 30.0–36.0)
MCV: 80.2 fL (ref 80.0–100.0)
Platelets: 180 K/uL (ref 150–400)
RBC: 6.36 MIL/uL — ABNORMAL HIGH (ref 4.22–5.81)
RDW: 19 % — ABNORMAL HIGH (ref 11.5–15.5)
WBC: 9.4 K/uL (ref 4.0–10.5)
nRBC: 0 % (ref 0.0–0.2)

## 2024-05-11 NOTE — Telephone Encounter (Signed)
 Patient wants to know if having a CT scan will affect his device.

## 2024-05-12 ENCOUNTER — Ambulatory Visit (HOSPITAL_COMMUNITY): Payer: Self-pay | Admitting: Family Medicine

## 2024-05-15 ENCOUNTER — Ambulatory Visit (HOSPITAL_COMMUNITY): Payer: Self-pay | Admitting: Cardiology

## 2024-05-17 NOTE — Progress Notes (Signed)
 Remote ICD Transmission

## 2024-05-19 ENCOUNTER — Ambulatory Visit (HOSPITAL_COMMUNITY)
Admission: RE | Admit: 2024-05-19 | Discharge: 2024-05-19 | Disposition: A | Discharge status: Home / Self Care | Source: Ambulatory Visit | Attending: Cardiology | Admitting: Cardiology

## 2024-05-19 DIAGNOSIS — R079 Chest pain, unspecified: Secondary | ICD-10-CM | POA: Diagnosis not present

## 2024-05-19 DIAGNOSIS — F1910 Other psychoactive substance abuse, uncomplicated: Secondary | ICD-10-CM | POA: Insufficient documentation

## 2024-05-19 DIAGNOSIS — I472 Ventricular tachycardia, unspecified: Secondary | ICD-10-CM | POA: Diagnosis not present

## 2024-05-19 DIAGNOSIS — I5022 Chronic systolic (congestive) heart failure: Secondary | ICD-10-CM | POA: Insufficient documentation

## 2024-05-19 DIAGNOSIS — Z859 Personal history of malignant neoplasm, unspecified: Secondary | ICD-10-CM | POA: Diagnosis not present

## 2024-05-19 DIAGNOSIS — I2510 Atherosclerotic heart disease of native coronary artery without angina pectoris: Secondary | ICD-10-CM | POA: Diagnosis not present

## 2024-05-19 DIAGNOSIS — I340 Nonrheumatic mitral (valve) insufficiency: Secondary | ICD-10-CM | POA: Diagnosis not present

## 2024-05-19 DIAGNOSIS — R0602 Shortness of breath: Secondary | ICD-10-CM | POA: Insufficient documentation

## 2024-05-19 DIAGNOSIS — F17210 Nicotine dependence, cigarettes, uncomplicated: Secondary | ICD-10-CM | POA: Diagnosis not present

## 2024-05-19 DIAGNOSIS — Z95810 Presence of automatic (implantable) cardiac defibrillator: Secondary | ICD-10-CM | POA: Diagnosis not present

## 2024-05-19 DIAGNOSIS — J449 Chronic obstructive pulmonary disease, unspecified: Secondary | ICD-10-CM | POA: Insufficient documentation

## 2024-05-19 DIAGNOSIS — I428 Other cardiomyopathies: Secondary | ICD-10-CM | POA: Diagnosis not present

## 2024-05-19 LAB — ECHOCARDIOGRAM COMPLETE
Calc EF: 34 %
MV VTI: 1.61 cm2
S' Lateral: 6.2 cm
Single Plane A2C EF: 37 %
Single Plane A4C EF: 33.5 %

## 2024-05-19 NOTE — Progress Notes (Signed)
  Echocardiogram 2D Echocardiogram has been performed.  [name] 05/19/2024, 4:02 PM

## 2024-05-23 ENCOUNTER — Ambulatory Visit: Payer: Self-pay | Admitting: Cardiology

## 2024-05-24 ENCOUNTER — Other Ambulatory Visit (HOSPITAL_COMMUNITY): Payer: Self-pay

## 2024-05-24 DIAGNOSIS — Z5181 Encounter for therapeutic drug level monitoring: Secondary | ICD-10-CM | POA: Diagnosis not present

## 2024-05-24 DIAGNOSIS — F4322 Adjustment disorder with anxiety: Secondary | ICD-10-CM | POA: Diagnosis not present

## 2024-05-25 ENCOUNTER — Other Ambulatory Visit (HOSPITAL_COMMUNITY): Payer: Self-pay

## 2024-05-26 DIAGNOSIS — R3129 Other microscopic hematuria: Secondary | ICD-10-CM | POA: Diagnosis not present

## 2024-05-26 DIAGNOSIS — K5730 Diverticulosis of large intestine without perforation or abscess without bleeding: Secondary | ICD-10-CM | POA: Diagnosis not present

## 2024-05-28 ENCOUNTER — Ambulatory Visit: Payer: Self-pay | Admitting: Cardiology

## 2024-06-05 NOTE — Telephone Encounter (Signed)
 Spoke with the patient and rescheduled his ablation to 11/28.   Discuss episode that patient had last night. He reports he had missed some of his medications and had taken them late. He reports shaking for a short period of time. He remained conscious and alert during the episode. Symptoms resolved after taking ativan. He is feeling fine today with no other concerns. He is anxious about having an ablation but also wants to go ahead and get it done.

## 2024-06-06 ENCOUNTER — Telehealth: Payer: Self-pay

## 2024-06-06 NOTE — Telephone Encounter (Signed)
-----   Message from Nurse Carlyle C sent at 06/05/2024  1:50 PM EST ----- Regarding: 11/28 afib ablation FW: 07/03/24 afib ablation Patient's afib ablation with Dr. Kennyth has been moved to 11/28.   Thanks! Carly ----- Message ----- From: Chauvigne, Carlyle, RN Sent: 05/02/2024   3:15 PM EST To: Pluma Diniz, CMA; [place] MALVA Sever, RN;# Subject: 07/03/24 afib ablation                         Precert:  MD: Kennyth Type of ablation: A-fib Diagnosis: A-Fib CPT code: A-fib (06343) Ablation scheduled (date/time): 07/03/24 at 9:30am  Procedure:  Added to calendar? Yes Orders entered? Yes Letter complete? No, >30 days before procedure Scheduled with cath lab? Yes Any medications to hold? No Labs ordered (CBC, BMET, PT/INR if on warfarin): Yes Mapping system: Doesn't matter CARTO/OPAL rep notified? No Cardiac CT needed? No Letter method: MyChart H&P: 9/2 Device: Yes, SJ ICD  Follow-up:  Cassie/Angel, please schedule Routine.  Covering RN - please send this message to [employer], EP scheduler, EP Scheduling pool, EP [employer], and CT scheduler (Brittany Lynch/Stephanie Mogg), if indicated.

## 2024-06-07 NOTE — Pre-Procedure Instructions (Signed)
 Attempted to call patient regarding procedure instructions for Friday.  Unable to leave voiceamil, mailbox full.  Below are the following instructions.   Arrival time 0730 Nothing to eat or drink after midnight No meds AM of procedure Responsible person to drive you home and stay with you for 24 hrs  Have you missed any doses of anti-coagulant Xarelto - should be taken once a day, if you have missed any doses please let us  know.

## 2024-06-09 ENCOUNTER — Other Ambulatory Visit: Payer: Self-pay

## 2024-06-09 ENCOUNTER — Ambulatory Visit (HOSPITAL_COMMUNITY): Admitting: Anesthesiology

## 2024-06-09 ENCOUNTER — Ambulatory Visit (HOSPITAL_COMMUNITY)
Admission: RE | Admit: 2024-06-09 | Discharge: 2024-06-09 | Disposition: A | Discharge status: Home / Self Care | Attending: Cardiology | Admitting: Cardiology

## 2024-06-09 ENCOUNTER — Ambulatory Visit (HOSPITAL_COMMUNITY)
Admission: RE | Disposition: A | Discharge status: Home / Self Care | Payer: Self-pay | Source: Home / Self Care | Attending: Cardiology

## 2024-06-09 DIAGNOSIS — Z95810 Presence of automatic (implantable) cardiac defibrillator: Secondary | ICD-10-CM | POA: Insufficient documentation

## 2024-06-09 DIAGNOSIS — I252 Old myocardial infarction: Secondary | ICD-10-CM | POA: Insufficient documentation

## 2024-06-09 DIAGNOSIS — Z7901 Long term (current) use of anticoagulants: Secondary | ICD-10-CM | POA: Diagnosis not present

## 2024-06-09 DIAGNOSIS — I110 Hypertensive heart disease with heart failure: Secondary | ICD-10-CM | POA: Insufficient documentation

## 2024-06-09 DIAGNOSIS — Z8673 Personal history of transient ischemic attack (TIA), and cerebral infarction without residual deficits: Secondary | ICD-10-CM | POA: Diagnosis not present

## 2024-06-09 DIAGNOSIS — I2510 Atherosclerotic heart disease of native coronary artery without angina pectoris: Secondary | ICD-10-CM | POA: Insufficient documentation

## 2024-06-09 DIAGNOSIS — I5022 Chronic systolic (congestive) heart failure: Secondary | ICD-10-CM | POA: Insufficient documentation

## 2024-06-09 DIAGNOSIS — D6869 Other thrombophilia: Secondary | ICD-10-CM | POA: Insufficient documentation

## 2024-06-09 DIAGNOSIS — Z7902 Long term (current) use of antithrombotics/antiplatelets: Secondary | ICD-10-CM | POA: Insufficient documentation

## 2024-06-09 DIAGNOSIS — I480 Paroxysmal atrial fibrillation: Secondary | ICD-10-CM | POA: Diagnosis not present

## 2024-06-09 DIAGNOSIS — J449 Chronic obstructive pulmonary disease, unspecified: Secondary | ICD-10-CM | POA: Insufficient documentation

## 2024-06-09 DIAGNOSIS — I739 Peripheral vascular disease, unspecified: Secondary | ICD-10-CM | POA: Diagnosis not present

## 2024-06-09 HISTORY — PX: ATRIAL FIBRILLATION ABLATION: EP1191

## 2024-06-09 LAB — GLUCOSE, CAPILLARY: Glucose-Capillary: 108 mg/dL — ABNORMAL HIGH (ref 70–99)

## 2024-06-09 LAB — POCT ACTIVATED CLOTTING TIME: Activated Clotting Time: 348 s

## 2024-06-09 SURGERY — ATRIAL FIBRILLATION ABLATION
Anesthesia: General

## 2024-06-09 MED ORDER — HEPARIN SODIUM (PORCINE) 1000 UNIT/ML IJ SOLN
INTRAMUSCULAR | Status: DC | PRN
  Administered 2024-06-09: 15000 [IU] via INTRAVENOUS

## 2024-06-09 MED ORDER — ROCURONIUM BROMIDE 10 MG/ML (PF) SYRINGE
PREFILLED_SYRINGE | INTRAVENOUS | Status: DC | PRN
Start: 2024-06-09 — End: 2024-06-09
  Administered 2024-06-09: 90 mg via INTRAVENOUS
  Administered 2024-06-09: 50 mg via INTRAVENOUS

## 2024-06-09 MED ORDER — PHENYLEPHRINE HCL-NACL 20-0.9 MG/250ML-% IV SOLN
INTRAVENOUS | Status: DC | PRN
Start: 2024-06-09 — End: 2024-06-09
  Administered 2024-06-09: 10 ug/min via INTRAVENOUS

## 2024-06-09 MED ORDER — ONDANSETRON HCL 4 MG/2ML IJ SOLN
INTRAMUSCULAR | Status: DC | PRN
  Administered 2024-06-09: 4 mg via INTRAVENOUS

## 2024-06-09 MED ORDER — PROTAMINE SULFATE 10 MG/ML IV SOLN
INTRAVENOUS | Status: DC | PRN
  Administered 2024-06-09: 30 mg via INTRAVENOUS
  Administered 2024-06-09: 5 mg via INTRAVENOUS

## 2024-06-09 MED ORDER — ATROPINE SULFATE 1 MG/ML IV SOLN
INTRAVENOUS | Status: DC | PRN
  Administered 2024-06-09: 1 mg via INTRAVENOUS

## 2024-06-09 MED ORDER — DIPHENHYDRAMINE HCL 50 MG/ML IJ SOLN
INTRAMUSCULAR | Status: DC | PRN
  Administered 2024-06-09: 12.5 mg via INTRAVENOUS

## 2024-06-09 MED ORDER — FENTANYL CITRATE (PF) 100 MCG/2ML IJ SOLN
INTRAMUSCULAR | Status: AC
  Filled 2024-06-09: qty 2

## 2024-06-09 MED ORDER — SODIUM CHLORIDE 0.9 % IV SOLN: INTRAVENOUS | Status: DC

## 2024-06-09 MED ORDER — SODIUM CHLORIDE 0.9% FLUSH: 3.0000 mL | Freq: Two times a day (BID) | INTRAVENOUS | Status: DC

## 2024-06-09 MED ORDER — CEFAZOLIN SODIUM-DEXTROSE 2-4 GM/100ML-% IV SOLN
2.0000 g | Freq: Once | INTRAVENOUS | Status: AC
  Administered 2024-06-09: 2 g via INTRAVENOUS

## 2024-06-09 MED ORDER — PROPOFOL 10 MG/ML IV BOLUS
INTRAVENOUS | Status: DC | PRN
  Administered 2024-06-09: 180 mg via INTRAVENOUS

## 2024-06-09 MED ORDER — MIDAZOLAM HCL (PF) 2 MG/2ML IJ SOLN
INTRAMUSCULAR | Status: DC | PRN
  Administered 2024-06-09: 2 mg via INTRAVENOUS

## 2024-06-09 MED ORDER — CEFAZOLIN SODIUM-DEXTROSE 2-4 GM/100ML-% IV SOLN
INTRAVENOUS | Status: AC
  Filled 2024-06-09: qty 100

## 2024-06-09 MED ORDER — DEXAMETHASONE SOD PHOSPHATE PF 10 MG/ML IJ SOLN
INTRAMUSCULAR | Status: DC | PRN
  Administered 2024-06-09: 10 mg via INTRAVENOUS

## 2024-06-09 MED ORDER — SODIUM CHLORIDE 0.9 % IV SOLN: 250.0000 mL | INTRAVENOUS | Status: DC | PRN

## 2024-06-09 MED ORDER — RIVAROXABAN 20 MG PO TABS
20.0000 mg | ORAL_TABLET | Freq: Once | ORAL | Status: AC
  Administered 2024-06-09: 20 mg via ORAL
  Filled 2024-06-09: qty 1

## 2024-06-09 MED ORDER — ONDANSETRON HCL 4 MG/2ML IJ SOLN: 4.0000 mg | Freq: Four times a day (QID) | INTRAMUSCULAR | Status: DC | PRN

## 2024-06-09 MED ORDER — LIDOCAINE 2% (20 MG/ML) 5 ML SYRINGE
INTRAMUSCULAR | Status: DC | PRN
  Administered 2024-06-09: 80 mg via INTRAVENOUS

## 2024-06-09 MED ORDER — SODIUM CHLORIDE 0.9% FLUSH: 3.0000 mL | INTRAVENOUS | Status: DC | PRN

## 2024-06-09 MED ORDER — PROPOFOL 500 MG/50ML IV EMUL
INTRAVENOUS | Status: DC | PRN
  Administered 2024-06-09: 125 ug/kg/min via INTRAVENOUS

## 2024-06-09 MED ORDER — EPHEDRINE SULFATE-NACL 50-0.9 MG/10ML-% IV SOSY
PREFILLED_SYRINGE | INTRAVENOUS | Status: DC | PRN
  Administered 2024-06-09: 2.5 mg via INTRAVENOUS

## 2024-06-09 MED ORDER — SUGAMMADEX SODIUM 200 MG/2ML IV SOLN
INTRAVENOUS | Status: DC | PRN
  Administered 2024-06-09: 354 mg via INTRAVENOUS

## 2024-06-09 MED ORDER — FENTANYL CITRATE (PF) 250 MCG/5ML IJ SOLN
INTRAMUSCULAR | Status: DC | PRN
  Administered 2024-06-09 (×2): 50 ug via INTRAVENOUS

## 2024-06-09 MED ORDER — MIDAZOLAM HCL 2 MG/2ML IJ SOLN
INTRAMUSCULAR | Status: AC
  Filled 2024-06-09: qty 2

## 2024-06-09 MED ORDER — HEPARIN (PORCINE) IN NACL 1000-0.9 UT/500ML-% IV SOLN
INTRAVENOUS | Status: DC | PRN
  Administered 2024-06-09 (×3): 500 mL

## 2024-06-09 SURGICAL SUPPLY — 18 items
CABLE FARASTAR GEN2 SNGL USE (CABLE) IMPLANT
CATH BI DIR 7FR CS F-J 12 PIN (CATHETERS) IMPLANT
CATH FARAWAVE 2.0 31 (CATHETERS) IMPLANT
CATH GE 8FR SOUNDSTAR (CATHETERS) IMPLANT
CATH OCTARAY 2.0 F 3-3-3-3-3 (CATHETERS) IMPLANT
CLOSURE PERCLOSE PROSTYLE (Vascular Products) IMPLANT
COVER SWIFTLINK CONNECTOR (BAG) ×2 IMPLANT
DILATOR VESSEL 38 20CM 16FR (INTRODUCER) IMPLANT
GUIDEWIRE INQWIRE 1.5J.035X260 (WIRE) IMPLANT
KIT VERSACROSS CNCT FARADRIVE (KITS) IMPLANT
PACK EP LF (CUSTOM PROCEDURE TRAY) ×2 IMPLANT
PAD DEFIB RADIO PHYSIO CONN (PAD) ×2 IMPLANT
PATCH CARTO3 (PAD) IMPLANT
SHEATH FARADRIVE STEERABLE (SHEATH) IMPLANT
SHEATH PINNACLE 8F 10CM (SHEATH) IMPLANT
SHEATH PINNACLE 9F 10CM (SHEATH) IMPLANT
SHEATH PROBE COVER 6X72 (BAG) IMPLANT
WIRE HI TORQ VERSACORE-J 145CM (WIRE) IMPLANT

## 2024-06-09 NOTE — Discharge Instructions (Signed)

## 2024-06-09 NOTE — H&P (Signed)
 Electrophysiology Note:   Date:  06/09/24  ID:  ARSHAD OBERHOLZER, DOB [birth year], MRN [id number]   Primary Cardiologist: None Electrophysiologist: Elspeth Sage, MD       History of Present Illness:   JULIES CARMICKLE is a 63 y.o. male with h/o VT, ICM/sCHF s/p ICD, CAD s/p PCI 2015 to RCA, OSA, MAI, chronic hypoxic respiratory failure, depression / anxiety, spinal stenosis, opoid use/abuse with overdose admissions seen today for cardiac clearance for L2-L3, L3-L4 DLIF left prone transpsoas, L4-L5, L5-S1 TLIF- posterior lateral and interbody fusion who is being seen today for follow-up device management.   On 03/02/2024 patient had multiple device therapies.   Discussed the use of AI scribe software for clinical note transcription with the patient, who gave verbal consent to proceed.   History of Present Illness KAIDE GAGE is a 63 year old male with atrial fibrillation and heart failure who presents with recent episodes of device shocks and worsening symptoms.   He has been experiencing an increase in the frequency of shocks from his cardiac device over the past four to five months, with the most recent episodes occurring in the last few days. These episodes are associated with feelings of fear, nervousness, and shakiness, as well as shortness of breath and fatigue.   His atrial fibrillation has been occurring more frequently over the past four to five months. He feels progressively worse with each episode of atrial fibrillation.   His current medications include carvedilol , lisinopril , and spironolactone . Carvedilol  helps with his symptoms, but he has experienced lightheadedness with metoprolol  in the past. He is also on Plavix .   He recently experienced an episode of illness after eating fish, which he had tolerated the previous day. He has a history of kidney issues related to medication use three years ago. He is concerned about maintaining his weight and mentions previous issues with  medication approval through insurance.    Interval: Patient presents today for planned ablation. Reports feeling relatively well. No new or acute complaints.   Review of systems complete and found to be negative unless listed in HPI.    EP Information / Studies Reviewed:      EKG Interpretation Date/Time:                  Tuesday March 14 2024 11:28:01 EDT Ventricular Rate:         74 PR Interval:                 164 QRS Duration:             126 QT Interval:                 438 QTC Calculation:486 R Axis:                         102   Text Interpretation:Sinus rhythm with frequent Premature ventricular complexes Rightward axis Non-specific intra-ventricular conduction block T wave abnormality, consider inferior ischemia When compared with ECG of 25-Oct-2023 15:42, Premature atrial complexes are no longer Present T wave inversion less evident in Lateral leads Confirmed by Kennyth Chew [phone]) on 03/15/2024 11:14:39 AM    Echo 09/17/22:   1. Left ventricular ejection fraction, by estimation, is 35 to 40%. The  left ventricle has moderately decreased function. The left ventricle  demonstrates regional wall motion abnormalities (see scoring  diagram/findings for description). Left ventricular   diastolic parameters are consistent with Grade I diastolic  dysfunction  (impaired relaxation). There is hypokinesis of the left ventricular,  entire inferior wall and inferolateral wall.   2. Right ventricular systolic function is mildly reduced. The right  ventricular size is normal. There is normal pulmonary artery systolic  pressure.   3. Left atrial size was mildly dilated.   4. The mitral valve is normal in structure. Trivial mitral valve  regurgitation. No evidence of mitral stenosis.   5. The aortic valve is tricuspid. Aortic valve regurgitation is not  visualized. No aortic stenosis is present.   6. The inferior vena cava is normal in size with greater than 50%  respiratory  variability, suggesting right atrial pressure of 3 mmHg.    Risk Assessment/Calculations:     CHA2DS2-VASc Score = 5   This indicates a 7.2% annual risk of stroke. The patient's score is based upon: CHF History: 1 HTN History: 1 Diabetes History: 0 Stroke History: 2 Vascular Disease History: 1 Age Score: 0 Gender Score: 0           Physical Exam:    Today's Vitals   06/09/24 0837 06/09/24 0904  BP: 116/75   Pulse: 65   Resp: 17   Temp: 98.4 F (36.9 C)   TempSrc: Oral   SpO2: 96%   Weight: 88.5 kg   Height: 5' 11 (1.803 m)   PainSc:  5    Body mass index is 27.2 kg/m.  GEN: Well nourished, well developed in no acute distress NECK: No JVD CARDIAC: Normal rate, regular rhythm RESPIRATORY:  Clear to auscultation without rales, wheezing or rhonchi  ABDOMEN: Soft, non-distended EXTREMITIES:  No edema; No deformity    ASSESSMENT AND PLAN:      # Paroxysmal atrial fibrillation: # Secondary hypercoagulable state due to atrial fibrillation: - Had previously been diagnosed with subclinical atrial fibrillation /device detected atrial fibrillation.  More recently, on 8/21 he developed atrial fibrillation with RVR lasting for hours, which initiated VT and VF resulting in device shock.  Given this as well as his underlying systolic heart failure, rhythm control strategy has been prioritized.   -Discussed treatment options today for AF including antiarrhythmic drug therapy and ablation. Discussed risks, recovery and likelihood of success with each treatment strategy. Risk, benefits, and alternatives to EP study and ablation for afib were discussed. These risks include but are not limited to stroke, bleeding, vascular damage, tamponade, perforation, damage to the esophagus, lungs, phrenic nerve and other structures, pulmonary vein stenosis, worsening renal function, coronary vasospasm and death.  Discussed potential need for repeat ablation procedures and antiarrhythmic drugs after  an initial ablation. The patient understands these risk and wishes to proceed today. -Continue Xarelto  20 mg once daily. -Continue metoprolol  XL 25 mg.  This can be uptitrated as needed.   # VT s/p ICD: # ICD shocks: All occurred in setting of AF w/ RVR. - Will given Ancef  as prophylaxis.   # Chronic systolic heart failure secondary to ischemic cardiomyopathy:  # CAD s/p PCI: -Continue GDMT regimen of Farxiga , lisinopril , spironolactone .  Continue metoprolol  XL as noted above and uptitrate as tolerated. -Continue close follow-up with HF clinic, Dr. Cherrie.    Follow up with Dr. Kennyth 3 months after ablation.    Signed, Fonda Kennyth, MD

## 2024-06-09 NOTE — Transfer of Care (Signed)
 Immediate Anesthesia Transfer of Care Note  Patient: [name]  Procedure(s) Performed: ATRIAL FIBRILLATION ABLATION  Patient Location: PACU  Anesthesia Type:General  Level of Consciousness: awake, alert , and drowsy  Airway & Oxygen  Therapy: Patient Spontanous Breathing  Post-op Assessment: Report given to RN and Post -op Vital signs reviewed and stable  Post vital signs: Reviewed and stable  Last Vitals:  Vitals Value Taken Time  BP 98/69 06/09/24 12:10  Temp 36.2 C 06/09/24 11:48  Pulse 62 06/09/24 12:19  Resp 10 06/09/24 12:19  SpO2 90 % 06/09/24 12:19  Vitals shown include unfiled device data.  Last Pain:  Vitals:   06/09/24 1148  TempSrc: Tympanic  PainSc:          Complications: No notable events documented.

## 2024-06-09 NOTE — Progress Notes (Signed)
 Discharge instructions reviewed with patient and partner at bedside. Denies questions concerns. PT tolerated PO intake. Ambulated in the hallway, was able to void without difficulty. Seen by MD. Incision site remains clean dry and intact. No s/s of complications. PT escorted from the unit via wheel chair to personal vehicle.

## 2024-06-09 NOTE — Anesthesia Postprocedure Evaluation (Signed)
 Anesthesia Post Note  Patient: [name]  Procedure(s) Performed: ATRIAL FIBRILLATION ABLATION     Patient location during evaluation: PACU Anesthesia Type: General Level of consciousness: awake and alert Pain management: pain level controlled Vital Signs Assessment: post-procedure vital signs reviewed and stable Respiratory status: spontaneous breathing, nonlabored ventilation and respiratory function stable Cardiovascular status: blood pressure returned to baseline and stable Postop Assessment: no apparent nausea or vomiting Anesthetic complications: no   No notable events documented.  Last Vitals:  Vitals:   06/09/24 1155 06/09/24 1200  BP: 109/66 105/69  Pulse: 64 62  Resp: 11 (!) 8  Temp:    SpO2: 93% 93%    Last Pain:  Vitals:   06/09/24 1148  TempSrc: Tympanic  PainSc:    Pain Goal:                   Butler Levander Pinal

## 2024-06-09 NOTE — Anesthesia Preprocedure Evaluation (Addendum)
 Anesthesia Evaluation  Patient identified by MRN, date of birth, ID band Patient awake    Reviewed: Allergy & Precautions, H&P , NPO status , Patient's Chart, lab work & pertinent test results  Airway Mallampati: II  TM Distance: >3 FB Neck ROM: Full    Dental  (+) Dental Advisory Given   Pulmonary sleep apnea , COPD, Current Smoker and Patient abstained from smoking.   Pulmonary exam normal breath sounds clear to auscultation       Cardiovascular + angina  + CAD, + Past MI, + Peripheral Vascular Disease and +CHF  Normal cardiovascular exam+ dysrhythmias Atrial Fibrillation  Rhythm:Regular Rate:Normal     Neuro/Psych   Anxiety Depression    TIA negative psych ROS   GI/Hepatic negative GI ROS, Neg liver ROS,,,  Endo/Other  negative endocrine ROS    Renal/GU negative Renal ROS  negative genitourinary   Musculoskeletal  (+) Arthritis ,    Abdominal   Peds negative pediatric ROS (+)  Hematology negative hematology ROS (+)   Anesthesia Other Findings   Reproductive/Obstetrics negative OB ROS                              Anesthesia Physical Anesthesia Plan  ASA: 4  Anesthesia Plan: General   Post-op Pain Management:    Induction: Intravenous  PONV Risk Score and Plan: 1 and Ondansetron  and Treatment may vary due to age or medical condition  Airway Management Planned: Oral ETT  Additional Equipment:   Intra-op Plan:   Post-operative Plan: Extubation in OR  Informed Consent: I have reviewed the patients History and Physical, chart, labs and discussed the procedure including the risks, benefits and alternatives for the proposed anesthesia with the patient or authorized representative who has indicated his/her understanding and acceptance.     Dental advisory given  Plan Discussed with: CRNA  Anesthesia Plan Comments:         Anesthesia Quick Evaluation

## 2024-06-09 NOTE — Anesthesia Procedure Notes (Signed)
 Procedure Name: Intubation Date/Time: 06/09/2024 10:09 AM  Performed by: Mollie Olivia SAUNDERS, CRNAPre-anesthesia Checklist: Patient identified, Emergency Drugs available, Suction available and Patient being monitored Patient Re-evaluated:Patient Re-evaluated prior to induction Oxygen  Delivery Method: Circle system utilized Preoxygenation: Pre-oxygenation with 100% oxygen  Induction Type: IV induction Ventilation: Two handed mask ventilation required Laryngoscope Size: Glidescope and 4 Grade View: Grade I Tube type: Oral Tube size: 7.5 mm Number of attempts: 1 Airway Equipment and Method: Oral airway, Rigid stylet and Video-laryngoscopy Placement Confirmation: ETT inserted through vocal cords under direct vision, positive ETCO2 and breath sounds checked- equal and bilateral Secured at: 23 cm Tube secured with: Tape Dental Injury: Teeth and Oropharynx as per pre-operative assessment  Difficulty Due To: Difficulty was anticipated and Difficult Airway- due to anterior larynx

## 2024-06-10 ENCOUNTER — Encounter (HOSPITAL_COMMUNITY): Payer: Self-pay | Admitting: Cardiology

## 2024-06-12 ENCOUNTER — Telehealth (HOSPITAL_COMMUNITY): Payer: Self-pay

## 2024-06-12 MED FILL — Fentanyl Citrate Preservative Free (PF) Inj 100 MCG/2ML: INTRAMUSCULAR | Qty: 2 | Status: AC

## 2024-06-12 NOTE — Telephone Encounter (Signed)
 Spoke with patient to complete post procedure follow up call.  Patient reports no complications with groin sites.   Instructions reviewed with patient:  Remove large bandage at puncture site after 24 hours. It is normal to have bruising, tenderness, mild swelling, and a pea or marble sized lump/knot at the groin site which can take up to three months to resolve.  Get help right away if you notice sudden swelling at the puncture site.  Check your puncture site every day for signs of infection: fever, redness, swelling, pus drainage, warmth, foul odor or excessive pain. If this occurs, please call [phone], to speak with the RN Navigator. Get help right away if your puncture site is bleeding and the bleeding does not stop after applying firm pressure to the area.  You may continue to have skipped beats/ atrial fibrillation during the first several months after your procedure.  It is very important not to miss any doses of your blood thinner Xarelto .    You will follow up with the Afib clinic 4 weeks after your procedure and follow up with the APP 3 months after your procedure.  Activity restrictions reviewed.  Patient verbalized understanding to all instructions provided.

## 2024-06-15 DIAGNOSIS — F4322 Adjustment disorder with anxiety: Secondary | ICD-10-CM | POA: Diagnosis not present

## 2024-06-22 ENCOUNTER — Encounter (HOSPITAL_COMMUNITY): Payer: Self-pay

## 2024-06-22 ENCOUNTER — Ambulatory Visit (HOSPITAL_COMMUNITY): Payer: Self-pay | Admitting: Cardiology

## 2024-06-22 ENCOUNTER — Ambulatory Visit (HOSPITAL_COMMUNITY): Admission: RE | Admit: 2024-06-22 | Discharge: 2024-06-22 | Attending: Cardiology

## 2024-06-22 VITALS — BP 118/70 | HR 66 | Ht 71.0 in | Wt 212.4 lb

## 2024-06-22 DIAGNOSIS — I5022 Chronic systolic (congestive) heart failure: Secondary | ICD-10-CM

## 2024-06-22 DIAGNOSIS — Z716 Tobacco abuse counseling: Secondary | ICD-10-CM | POA: Diagnosis not present

## 2024-06-22 DIAGNOSIS — F32A Depression, unspecified: Secondary | ICD-10-CM | POA: Diagnosis not present

## 2024-06-22 DIAGNOSIS — Z79899 Other long term (current) drug therapy: Secondary | ICD-10-CM | POA: Diagnosis not present

## 2024-06-22 DIAGNOSIS — M4800 Spinal stenosis, site unspecified: Secondary | ICD-10-CM | POA: Diagnosis not present

## 2024-06-22 DIAGNOSIS — Z7901 Long term (current) use of anticoagulants: Secondary | ICD-10-CM | POA: Diagnosis not present

## 2024-06-22 DIAGNOSIS — G8929 Other chronic pain: Secondary | ICD-10-CM | POA: Diagnosis not present

## 2024-06-22 DIAGNOSIS — Z6829 Body mass index (BMI) 29.0-29.9, adult: Secondary | ICD-10-CM | POA: Diagnosis not present

## 2024-06-22 DIAGNOSIS — I252 Old myocardial infarction: Secondary | ICD-10-CM | POA: Diagnosis not present

## 2024-06-22 DIAGNOSIS — I472 Ventricular tachycardia, unspecified: Secondary | ICD-10-CM | POA: Diagnosis not present

## 2024-06-22 DIAGNOSIS — E8770 Fluid overload, unspecified: Secondary | ICD-10-CM | POA: Diagnosis not present

## 2024-06-22 DIAGNOSIS — Z955 Presence of coronary angioplasty implant and graft: Secondary | ICD-10-CM | POA: Diagnosis not present

## 2024-06-22 DIAGNOSIS — G4733 Obstructive sleep apnea (adult) (pediatric): Secondary | ICD-10-CM | POA: Diagnosis not present

## 2024-06-22 DIAGNOSIS — F17210 Nicotine dependence, cigarettes, uncomplicated: Secondary | ICD-10-CM | POA: Diagnosis not present

## 2024-06-22 DIAGNOSIS — D751 Secondary polycythemia: Secondary | ICD-10-CM | POA: Diagnosis not present

## 2024-06-22 DIAGNOSIS — I2510 Atherosclerotic heart disease of native coronary artery without angina pectoris: Secondary | ICD-10-CM | POA: Diagnosis not present

## 2024-06-22 DIAGNOSIS — G4734 Idiopathic sleep related nonobstructive alveolar hypoventilation: Secondary | ICD-10-CM | POA: Diagnosis not present

## 2024-06-22 DIAGNOSIS — J9610 Chronic respiratory failure, unspecified whether with hypoxia or hypercapnia: Secondary | ICD-10-CM | POA: Diagnosis not present

## 2024-06-22 DIAGNOSIS — I4901 Ventricular fibrillation: Secondary | ICD-10-CM | POA: Diagnosis not present

## 2024-06-22 DIAGNOSIS — I255 Ischemic cardiomyopathy: Secondary | ICD-10-CM | POA: Diagnosis not present

## 2024-06-22 LAB — BASIC METABOLIC PANEL WITH GFR
Anion gap: 12 (ref 5–15)
BUN: 13 mg/dL (ref 8–23)
CO2: 32 mmol/L (ref 22–32)
Calcium: 8.8 mg/dL — ABNORMAL LOW (ref 8.9–10.3)
Chloride: 96 mmol/L — ABNORMAL LOW (ref 98–111)
Creatinine, Ser: 1.41 mg/dL — ABNORMAL HIGH (ref 0.61–1.24)
GFR, Estimated: 56 mL/min — ABNORMAL LOW (ref >60)
Glucose, Bld: 102 mg/dL — ABNORMAL HIGH (ref 70–99)
Potassium: 3.4 mmol/L — ABNORMAL LOW (ref 3.5–5.1)
Sodium: 140 mmol/L (ref 135–145)

## 2024-06-22 LAB — BRAIN NATRIURETIC PEPTIDE: B Natriuretic Peptide: 353.9 pg/mL — ABNORMAL HIGH (ref 0.0–100.0)

## 2024-06-22 MED ORDER — TORSEMIDE 20 MG PO TABS: 40.0000 mg | ORAL_TABLET | Freq: Two times a day (BID) | ORAL | 11 refills | Status: AC

## 2024-06-22 NOTE — Patient Instructions (Signed)
 Take torsemide  60 mg in am and 40 mg in pm for one day. Then take torsemide  40 mg twice daily after that - updated Rx sent. Labs today - will call you if abnormal. Message sent to Device Clinic asking them to do device check from your home in 2 weeks. Return to see Dr. Cherrie in 3 months.  CALL [phone] IN FEBRUARY TO SCHEDULE THIS APPOINTMENT IF YOU HAVEN'T HEARD FROM US . 5.   Please call [phone] if any questions or concerns prior to your next visit.

## 2024-06-22 NOTE — Progress Notes (Signed)
 Advanced Heart Failure Clinic Note  Primary Care: Dr Jakie HF Cardiologist: Dr. Cherrie  HPI: [name] is a 63 y.o. 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 Jude ICD implantation. Was enrolled in [employer].    Has been followed recently by Dr. Sharl at [facility]. Underwent cath at [facility] in 3/15 and had 2.5x43mm Xience DES placed in LAD. Had cath 12/15 here with stable CAD. Repeat cath set up in 10/18 but he cancelled.    Echo (8/19): EF 30-35% (stable) mild RV dilation.  Echo 07/18/19 EF 40-45% mild MR   Lost to follow up.  Admitted 10/23 with overdose and NSTEMI.  Had recent 8-week stay at rehab, returned home and found unresponsive. Given narcan  in field. Required ICU monitoring and Narcan  gtt. Treated for aspiration PNA and AKI. HS Trops elevated, cardiology consulted felt to be type II NSTEMI in setting of drug overdose with no recommended ischemic evaluation. Echo showed EF 25-30%. LHC deferred as reduced EF felt to be related to overdose. General cardiology consulted and GDMT titrated, but limited by AKI. Patient left AMA, weight 235 lbs.  Echo 09/17/22  EF 35-40% RV mod reduced.  Follow up 3/25 for pre-op  cardiac evaluation. Saw Dr. Debby in [facility]. Back MRI with severe generalized lumbar spine degeneration with scoliosis, multilevel listhesis, and discogenic edema + spinal stenosis. Plan to update echo and get PET perfusion scan. If testing not high risk, will grant clearance.   Saw EP 6/25, multiple episodes of VT, on 03/02/24 had AF with RVR for several hours, which devolved into VT and VF, with unsuccessful ATP and shock x 1. Coreg  switched to Toprol , started on Xarelto  (Plavix  and ASA stopped) and planned for AF ablation.  S/p AF ablation 11/28 by Dr. Kennyth   He  returns today for heart failure follow up with his partner.  Overall feeling much better since his ablation. Breathing and exercise tolerance improved. Less exertional dyspnea w/ ADLs. No palpitations. Device interrogation shows no detection of breakthrough Afib. He has however noticed wt gain and says that he self decreased his torsemide  several wks ago b/c he felt he was on too much diuretic. Wt is up 10 lb. CorVue fluid index also elevated, showing ongoing fluid accumulation over the last 17 days.   Wt Readings from Last 3 Encounters:  06/22/24 96.3 kg (212 lb 6.4 oz)  06/09/24 88.5 kg (195 lb)  04/24/24 91.6 kg (202 lb)     Cardiac Studies - Echo (3/24); EF 35-40%, G1DD, mildly reduced RV  - Echo (10/23): EF 25-30%, grade I DD, RV mildly reduced - Echo (07/18/19): EF 40-45% mild MR - Echo (2019): EF 30-35% - Cath at [facility] (12/15): stable CAD - Cath at [facility] (3/15): 2.5x24mm Xience DES placed in LAD.   Past Medical History:  Diagnosis Date   Anxiety    Automatic implantable cardiac defibrillator St Judes    Analyze ST   Benign prostatic hypertrophy    Benzodiazepine dependence (HCC)    chronic   CAD (coronary artery disease)    Last cath 2/12. 3-v CAD. Failed PCI of distal RCAc  CATH [facility] 4/13 with DES to  LAD X2   CHF (congestive heart failure) (HCC)    Chronic back pain    lumbar stenosis   Chronic  systolic heart failure (HCC)    EF 20-25%. s/p ST. Jude ICD   Depression    DJD (degenerative joint disease)    History of testicular cancer    Ischemic cardiomyopathy    Myocardial infarction [facility])    Narcotic dependence (HCC)    chronic   OSA on CPAP    Polycythemia, secondary    S/p hematology consultation for polycythema on 01/06/18.  S/p bone marrow biopsy on 12/31/17 that was negative for myeloproliferative disorder.  Feels secondary to sleep apnea, active smoking, testosterone  supplementation, nocturnal hypoxia   Substance abuse (HCC)    TIA (transient ischemic attack)     Vitamin B12 deficiency 07/22/2016   Current Outpatient Medications  Medication Sig Dispense Refill   acetaminophen  (TYLENOL ) 325 MG tablet Take 325 mg by mouth every 4 (four) hours as needed for mild pain (pain score 1-3) or moderate pain (pain score 4-6).     AFRIN 12 HOUR 0.05 % nasal spray Place 2 sprays into both nostrils at bedtime.     albuterol  (VENTOLIN  HFA) 108 (90 Base) MCG/ACT inhaler Inhale 1 puff into the lungs every 4 (four) hours as needed for wheezing or shortness of breath.     amiodarone  (PACERONE ) 200 MG tablet Take 1 tablet (200 mg total) by mouth 2 (two) times daily. 180 tablet 3   aspirin  EC 81 MG tablet Take 81 mg by mouth daily as needed. Swallow whole. When pt remembers     dapagliflozin  propanediol (FARXIGA ) 10 MG TABS tablet Take 1 tablet (10 mg total) by mouth daily before breakfast. 30 tablet 5   diclofenac  Sodium (VOLTAREN ) 1 % GEL Apply 1 Application topically daily as needed (pain).     lisinopril  (ZESTRIL ) 10 MG tablet Take 1 tablet (10 mg total) by mouth daily. 90 tablet 3   LORazepam (ATIVAN) 1 MG tablet Take 1 mg by mouth See admin instructions. 2 tabs in the morning and 1 tab in the evening, or however he wants     magnesium  oxide (MAG-OX) 400 (240 Mg) MG tablet Take 1 tablet by mouth daily as needed.     metoprolol  succinate (TOPROL  XL) 25 MG 24 hr tablet Take 1 tablet (25 mg total) by mouth daily. 90 tablet 3   naloxone  (NARCAN ) nasal spray 4 mg/0.1 mL Place 1 spray into the nose once as needed (for opioid emergency). 1 each 2   nitroGLYCERIN  (NITROSTAT ) 0.4 MG SL tablet Place 1 tablet (0.4 mg total) under the tongue every 5 (five) minutes as needed for chest pain. 90 tablet 3   ondansetron  (ZOFRAN ) 4 MG tablet Take 4 mg by mouth every 8 (eight) hours as needed.     pantoprazole  (PROTONIX ) 40 MG tablet Take 40 mg by mouth daily.     PEPCID  COMPLETE [phone] MG chewable tablet Chew 1 tablet by mouth daily as needed (for reflux).     potassium chloride  SA  (KLOR-CON  M) 20 MEQ tablet Take 3 tablets (60 mEq total) by mouth daily.     rivaroxaban  (XARELTO ) 20 MG TABS tablet Take 1 tablet (20 mg total) by mouth daily with supper. 30 tablet 11   sildenafil  (VIAGRA ) 100 MG tablet Take 50 mg by mouth daily as needed.     SOMA 250 MG tablet Take 350 mg by mouth 2 (two) times daily as needed.     spironolactone  (ALDACTONE ) 25 MG tablet Take 1 tablet (25 mg total) by mouth daily. 30 tablet 3   tadalafil  (CIALIS ) 20 MG tablet  Take 10 mg by mouth daily as needed for erectile dysfunction.     tamsulosin  (FLOMAX ) 0.4 MG CAPS capsule Take 1 capsule (0.4 mg total) by mouth daily after breakfast. 90 capsule 0   Testosterone  Cypionate 200 MG/ML KIT Inject 200 mg into the muscle every 14 (fourteen) days. 2 kit 0   traZODone  (DESYREL ) 100 MG tablet Take 200 mg by mouth at bedtime.     umeclidinium-vilanterol (ANORO ELLIPTA ) 62.5-25 MCG/ACT AEPB Inhale 1 puff into the lungs daily. (Patient taking differently: Inhale 1 puff into the lungs daily as needed.) 60 each 3   torsemide  (DEMADEX ) 20 MG tablet Take 2 tablets (40 mg total) by mouth 2 (two) times daily. 120 tablet 11   varenicline (CHANTIX) 0.5 MG tablet Take 0.5 mg by mouth 2 (two) times daily. (Patient not taking: Reported on 06/22/2024)     No current facility-administered medications for this encounter.   Allergies  Allergen Reactions   Darvocet [Propoxyphene N-Acetaminophen ] Anaphylaxis    Throat closes   Depakote [Divalproex Sodium] Other (See Comments)    Coded- ended up on a vent   Haldol [Haloperidol] Other (See Comments)    Coded- ended up on a vent   Propoxyphene Anaphylaxis and Swelling   Social History   Socioeconomic History   Marital status: Divorced    Spouse name: Not on file   Number of children: 4   Years of education: Not on file   Highest education level: Not on file  Occupational History   Not on file  Tobacco Use   Smoking status: Every Day    Current packs/day: 0.00     Average packs/day: 0.5 packs/day for 29.0 years (14.5 ttl pk-yrs)    Types: Cigarettes, E-cigarettes    Start date: 07/13/1986    Last attempt to quit: 07/14/2015    Years since quitting: 8.9   Smokeless tobacco: Never   Tobacco comments:    no cigarette in 4 days but yes to e-cig  Vaping Use   Vaping status: Every Day   Start date: 07/14/2015  Substance and Sexual Activity   Alcohol  use: No    Alcohol /week: 0.0 standard drinks of alcohol    Drug use: Yes    Types: Marijuana    Comment: Urine showed THC   Sexual activity: Yes    Partners: Male  Other Topics Concern   Not on file  Social History Narrative   Marital status: married x 12 years; wife is severe alcoholic.      Children: 3 married daughters (23, 64, 55); 1 son (10yo); 3 grandchildren born in 2017      Lives: with wife, son      Left-handed   Caffeine: 3 drinks per day   Social Drivers of Health   Tobacco Use: High Risk (06/22/2024)   Patient History    Smoking Tobacco Use: Every Day    Smokeless Tobacco Use: Never    Passive Exposure: Not on file  Financial Resource Strain: Not on file  Food Insecurity: No Food Insecurity (05/04/2022)   Hunger Vital Sign    Worried About Running Out of Food in the Last Year: Never true    Ran Out of Food in the Last Year: Never true  Transportation Needs: No Transportation Needs (05/04/2022)   PRAPARE - Administrator, Civil Service (Medical): No    Lack of Transportation (Non-Medical): No  Physical Activity: Not on file  Stress: Not on file  Social Connections: Not on file  Intimate Partner Violence: Not At Risk (05/04/2022)   Humiliation, Afraid, Rape, and Kick questionnaire    Fear of Current or Ex-Partner: No    Emotionally Abused: No    Physically Abused: No    Sexually Abused: No  Depression (PHQ2-9): Low Risk (09/11/2021)   Depression (PHQ2-9)    PHQ-2 Score: 3  Alcohol  Screen: Not on file  Housing: Low Risk (05/04/2022)   Housing    Last Housing Risk  Score: 0  Utilities: Not At Risk (05/04/2022)   AHC Utilities    Threatened with loss of utilities: No  Health Literacy: Not on file   Family History  Problem Relation Age of Onset   Heart disease Father 62       Died of MI   Cancer Mother    BP 118/70   Pulse 66   Ht 5' 11 (1.803 m)   Wt 96.3 kg (212 lb 6.4 oz)   SpO2 95%   BMI 29.62 kg/m   Wt Readings from Last 3 Encounters:  06/22/24 96.3 kg (212 lb 6.4 oz)  06/09/24 88.5 kg (195 lb)  04/24/24 91.6 kg (202 lb)   Physical Exam  GENERAL: NAD Lungs- clear  CARDIAC:  JVP 8 cm          Normal rate with regular rhythm. No MRG. 1+ b/l ankle edema  ABDOMEN: Soft, non-tender, non-distended.  EXTREMITIES: Warm and well perfused.  NEUROLOGIC: No obvious FND   Device interrogation (personally reviewed): 0% AF burden,CorVue suggestive of ongoing fluid accumulation x 17 days. No VT    ASSESSMENT & PLAN:  1. Chronic Systolic Heart Failure - iCM - Echo (8/19): EF 30-35%.  - S/p STJ ICD - Echo (07/18/19): EF 40-45% - Echo (10/23): EF 25-30%, grade I DD, RV mildly reduced, felt to be newly reduced due to overdose. - Echo (3/24): EF 35-40% RV mildly down - Echo (11/25): EF 30-35%, RV normal  - NYHA II, improving since AF ablation  - volume overloaded on exam and device interrogation after pt self reduced dose of torsemide  - Increase to 60/40 mg x 1 day then back to 40 mg bid (currently taking 40/30).  - Will send request to ICM clinic to reassess CorVue remotely in 2 wks  - continue Toprol  XL 25 mg daily - Continue Farxiga  10 mg daily - Continue lisinopril  10 mg daily - continue spironolactone  25 mg daily  2. AF - Had AF with RVR, devolved into VT/VF>ATP and shock - Follows with EP - S/p AF Ablation 11/26. No breakthrough AF on device interrogation  - remains on amio for now per EP  - Continue Xarelto  20 mg daily. No bleeding issues  3. CAD with ischemic CM - Stable by cath 2015. - Recent type II NSTEMI 2/2  overdose. - Denies CP  - Off Plavix , on ASA (with need for [facility]) - Continue statin. - Pet perfusion has been ordered, not completed  4. Chronic respiratory failure - Uses oxygen  at night.  5. Tobacco use - Continues to smoke. - Encouraged cessation   6. Polycythemia  - Followed by Dr. Etha at [facility], felt to be due to chronic hypoxia from OSA.  - Seen by Dr. Amadeo, who agrees.  7. Spinal stenosis - Followed by NSG - Has not had surgery yet   F/u in 3 months w/ Dr. Cherrie Caffie Shed, PA-C 06/22/2024

## 2024-06-23 ENCOUNTER — Other Ambulatory Visit (HOSPITAL_COMMUNITY): Payer: Self-pay

## 2024-06-26 NOTE — Addendum Note (Signed)
 Addended by: Honestie Kulik M on: 06/26/2024 12:29 PM   Modules accepted: Orders

## 2024-06-26 NOTE — Progress Notes (Signed)
 Request received from Advanced HF clinic after 06/22/2024 office visit:  [place] Geofm NOVAK, RN  Sims Laday, Mitzie RAMAN, RN Cc: Marcine Caffie HERO, PA-C Can you do a remote device check 2 weeks from now per Caffie Marcine, PA request.  [place] Geofm  ICM Remote Transmission Scheduled in [place] for 07/04/2024.

## 2024-06-28 ENCOUNTER — Encounter (HOSPITAL_COMMUNITY): Payer: Self-pay | Admitting: Emergency Medicine

## 2024-06-28 ENCOUNTER — Other Ambulatory Visit: Payer: Self-pay

## 2024-06-28 ENCOUNTER — Emergency Department (HOSPITAL_COMMUNITY)
Admission: EM | Admit: 2024-06-28 | Discharge: 2024-06-29 | Disposition: A | Discharge status: Home / Self Care | Attending: Emergency Medicine | Admitting: Emergency Medicine

## 2024-06-28 DIAGNOSIS — I5022 Chronic systolic (congestive) heart failure: Secondary | ICD-10-CM | POA: Insufficient documentation

## 2024-06-28 DIAGNOSIS — S59912A Unspecified injury of left forearm, initial encounter: Secondary | ICD-10-CM | POA: Diagnosis present

## 2024-06-28 DIAGNOSIS — T148XXA Other injury of unspecified body region, initial encounter: Secondary | ICD-10-CM

## 2024-06-28 DIAGNOSIS — S51812A Laceration without foreign body of left forearm, initial encounter: Secondary | ICD-10-CM | POA: Insufficient documentation

## 2024-06-28 DIAGNOSIS — W268XXA Contact with other sharp object(s), not elsewhere classified, initial encounter: Secondary | ICD-10-CM | POA: Diagnosis not present

## 2024-06-28 DIAGNOSIS — Z7901 Long term (current) use of anticoagulants: Secondary | ICD-10-CM | POA: Insufficient documentation

## 2024-06-28 LAB — CBC
HCT: 47.1 % (ref 39.0–52.0)
Hemoglobin: 14.6 g/dL (ref 13.0–17.0)
MCH: 25.8 pg — ABNORMAL LOW (ref 26.0–34.0)
MCHC: 31 g/dL (ref 30.0–36.0)
MCV: 83.2 fL (ref 80.0–100.0)
Platelets: 220 K/uL (ref 150–400)
RBC: 5.66 MIL/uL (ref 4.22–5.81)
RDW: 22.7 % — ABNORMAL HIGH (ref 11.5–15.5)
WBC: 7.5 K/uL (ref 4.0–10.5)
nRBC: 0 % (ref 0.0–0.2)

## 2024-06-28 NOTE — ED Triage Notes (Signed)
 Pt with small laceration to left forearm that has continued to ooze blood since 1700 today.  Pt is on xarelto .  Does not know what happened to cause the lac.  Bleeding controlled at time of triage with dressing that was already in place.

## 2024-06-28 NOTE — ED Provider Notes (Signed)
 [place] EMERGENCY DEPARTMENT AT [facility] Provider Note   CSN: [id number] Arrival date & time: 06/28/24  2213     Patient presents with: Laceration   [name] is a 63 y.o. male with history of substance abuse, TIA, depression, chronic systolic heart failure, chronic back pain, on anticoagulation.  Patient presents to ED complaining of laceration/abrasion to left forearm.  States that he and his significant other were involved in intercourse when he must have cut his arm.  Arrives with a small oozing area to his left forearm.  No obvious laceration.  See photo.  Denies lightheadedness, dizziness or weakness.   Laceration      Prior to Admission medications  Medication Sig Start Date End Date Taking? Authorizing Provider  acetaminophen  (TYLENOL ) 325 MG tablet Take 325 mg by mouth every 4 (four) hours as needed for mild pain (pain score 1-3) or moderate pain (pain score 4-6).    [provider]  AFRIN 12 HOUR 0.05 % nasal spray Place 2 sprays into both nostrils at bedtime.    [provider]  albuterol  (VENTOLIN  HFA) 108 (90 Base) MCG/ACT inhaler Inhale 1 puff into the lungs every 4 (four) hours as needed for wheezing or shortness of breath. 07/13/23   [provider]  amiodarone  (PACERONE ) 200 MG tablet Take 1 tablet (200 mg total) by mouth 2 (two) times daily. 04/21/24   Kennyth Chew, MD  aspirin  EC 81 MG tablet Take 81 mg by mouth daily as needed. Swallow whole. When pt remembers    [provider]  dapagliflozin  propanediol (FARXIGA ) 10 MG TABS tablet Take 1 tablet (10 mg total) by mouth daily before breakfast. 04/24/24   Lee, Jordan, NP  diclofenac  Sodium (VOLTAREN ) 1 % GEL Apply 1 Application topically daily as needed (pain).    [provider]  lisinopril  (ZESTRIL ) 10 MG tablet Take 1 tablet (10 mg total) by mouth daily. 04/24/24   Lee, Jordan, NP  LORazepam (ATIVAN) 1 MG tablet Take 1 mg by mouth See admin  instructions. 2 tabs in the morning and 1 tab in the evening, or however he wants    [provider]  magnesium  oxide (MAG-OX) 400 (240 Mg) MG tablet Take 1 tablet by mouth daily as needed. 04/12/24   [provider]  metoprolol  succinate (TOPROL  XL) 25 MG 24 hr tablet Take 1 tablet (25 mg total) by mouth daily. 03/14/24   Kennyth Chew, MD  naloxone  (NARCAN ) nasal spray 4 mg/0.1 mL Place 1 spray into the nose once as needed (for opioid emergency). 05/04/22   Ilah Corean HERO, PA-C  nitroGLYCERIN  (NITROSTAT ) 0.4 MG SL tablet Place 1 tablet (0.4 mg total) under the tongue every 5 (five) minutes as needed for chest pain. 09/21/22 09/13/24  Bensimhon, Toribio SAUNDERS, MD  ondansetron  (ZOFRAN ) 4 MG tablet Take 4 mg by mouth every 8 (eight) hours as needed. 07/24/22   [provider]  pantoprazole  (PROTONIX ) 40 MG tablet Take 40 mg by mouth daily. 05/21/23   [provider]  PEPCID  COMPLETE 10-800-165 MG chewable tablet Chew 1 tablet by mouth daily as needed (for reflux).    [provider]  potassium chloride  SA (KLOR-CON  M) 20 MEQ tablet Take 3 tablets (60 mEq total) by mouth daily. 04/07/24   Glena Harlene HERO, FNP  rivaroxaban  (XARELTO ) 20 MG TABS tablet Take 1 tablet (20 mg total) by mouth daily with supper. 03/14/24   Kennyth Chew, MD  sildenafil  (VIAGRA ) 100 MG tablet Take  50 mg by mouth daily as needed. 06/01/22   [provider]  SOMA 250 MG tablet Take 350 mg by mouth 2 (two) times daily as needed.    [provider]  spironolactone  (ALDACTONE ) 25 MG tablet Take 1 tablet (25 mg total) by mouth daily. 05/01/24   Bensimhon, Toribio SAUNDERS, MD  tadalafil  (CIALIS ) 20 MG tablet Take 10 mg by mouth daily as needed for erectile dysfunction. 05/19/22   [provider]  tamsulosin  (FLOMAX ) 0.4 MG CAPS capsule Take 1 capsule (0.4 mg total) by mouth daily after breakfast. 10/03/20   Kip Ade, NP  Testosterone  Cypionate 200 MG/ML KIT Inject 200 mg  into the muscle every 14 (fourteen) days. 08/20/22   Bensimhon, Toribio SAUNDERS, MD  torsemide  (DEMADEX ) 20 MG tablet Take 2 tablets (40 mg total) by mouth 2 (two) times daily. 06/22/24   Marcine Catalan M, PA-C  traZODone  (DESYREL ) 100 MG tablet Take 200 mg by mouth at bedtime.    [provider]  umeclidinium-vilanterol (ANORO ELLIPTA ) 62.5-25 MCG/ACT AEPB Inhale 1 puff into the lungs daily. Patient taking differently: Inhale 1 puff into the lungs daily as needed. 02/17/24     varenicline (CHANTIX) 0.5 MG tablet Take 0.5 mg by mouth 2 (two) times daily. Patient not taking: Reported on 06/22/2024 01/14/24   [provider]    Allergies: Darvocet [propoxyphene n-acetaminophen ], Depakote [divalproex sodium], Haldol [haloperidol], and Propoxyphene    Review of Systems  Skin:  Positive for wound.  All other systems reviewed and are negative.   Updated Vital Signs BP (!) 124/93   Pulse 73   Temp 98.2 F (36.8 C) (Oral)   Resp 16   SpO2 93%   Physical Exam Vitals and nursing note reviewed.  Constitutional:      General: He is not in acute distress.    Appearance: He is well-developed.  HENT:     Head: Normocephalic and atraumatic.  Eyes:     Conjunctiva/sclera: Conjunctivae normal.  Cardiovascular:     Rate and Rhythm: Normal rate and regular rhythm.     Heart sounds: No murmur heard. Pulmonary:     Effort: Pulmonary effort is normal. No respiratory distress.     Breath sounds: Normal breath sounds.  Abdominal:     Palpations: Abdomen is soft.     Tenderness: There is no abdominal tenderness.  Musculoskeletal:        General: No swelling.     Cervical back: Neck supple.  Skin:    General: Skin is warm and dry.     Capillary Refill: Capillary refill takes less than 2 seconds.     Comments: Small oozing abrasion to left forearm.  Neurological:     Mental Status: He is alert.  Psychiatric:        Mood and Affect: Mood normal.     (all labs ordered are listed,  but only abnormal results are displayed) Labs Reviewed  CBC - Abnormal; Notable for the following components:      Result Value   MCH 25.8 (*)    RDW 22.7 (*)    All other components within normal limits    EKG: None  Radiology: No results found.  Procedures   Medications Ordered in the ED - No data to display   Medical Decision Making Amount and/or Complexity of Data Reviewed Labs: ordered.   63 year old male presents with oozing abrasion to left forearm.  On blood thinners.  Patient labs checked, hemoglobin stable.  Patient had  come back because applied and after 20 minutes, rechecked and noted to have slowed bleeding.  Patient then had pinpoint gauze applied to wound, wrapped with gauze.  Patient advised to leave dressing on for 24 hours.  Patient advised to follow-up outpatient with PCP.  He is in agreement with plan.  Stable discharge.    Final diagnoses:  Abrasion    ED Discharge Orders     None          Ruthell Lonni JULIANNA DEVONNA 06/29/24 0031    Bari Charmaine JULIANNA, MD 06/29/24 0430

## 2024-06-29 NOTE — Discharge Instructions (Signed)
 Please keep bandage on for 24 hours.  You may remove it after this.  Follow-up with your PCP.

## 2024-06-30 ENCOUNTER — Ambulatory Visit (HOSPITAL_COMMUNITY)

## 2024-07-03 ENCOUNTER — Ambulatory Visit (HOSPITAL_COMMUNITY)
Admission: RE | Admit: 2024-07-03 | Discharge: 2024-07-03 | Disposition: A | Discharge status: Home / Self Care | Source: Ambulatory Visit | Attending: Internal Medicine | Admitting: Internal Medicine

## 2024-07-03 ENCOUNTER — Ambulatory Visit (HOSPITAL_COMMUNITY): Payer: Self-pay | Admitting: Cardiology

## 2024-07-03 DIAGNOSIS — I5022 Chronic systolic (congestive) heart failure: Secondary | ICD-10-CM | POA: Diagnosis not present

## 2024-07-03 LAB — BASIC METABOLIC PANEL WITH GFR
Anion gap: 10 (ref 5–15)
BUN: 19 mg/dL (ref 8–23)
CO2: 31 mmol/L (ref 22–32)
Calcium: 9.3 mg/dL (ref 8.9–10.3)
Chloride: 95 mmol/L — ABNORMAL LOW (ref 98–111)
Creatinine, Ser: 1.53 mg/dL — ABNORMAL HIGH (ref 0.61–1.24)
GFR, Estimated: 50 mL/min — ABNORMAL LOW
Glucose, Bld: 111 mg/dL — ABNORMAL HIGH (ref 70–99)
Potassium: 4.4 mmol/L (ref 3.5–5.1)
Sodium: 136 mmol/L (ref 135–145)

## 2024-07-04 ENCOUNTER — Telehealth: Payer: Self-pay

## 2024-07-04 ENCOUNTER — Ambulatory Visit: Attending: Cardiology

## 2024-07-04 DIAGNOSIS — I5022 Chronic systolic (congestive) heart failure: Secondary | ICD-10-CM

## 2024-07-04 DIAGNOSIS — Z95810 Presence of automatic (implantable) cardiac defibrillator: Secondary | ICD-10-CM

## 2024-07-04 NOTE — Progress Notes (Signed)
 EPIC Encounter for ICM Monitoring  Patient Name: [name] is a 63 y.o. male Date: 07/04/2024 Primary Care Physican: Yolande Toribio MATSU, MD Primary Cardiologist: Bensimhon Electrophysiologist: Inocencio 06/22/2024 Office Weight: 212.6 lbs (was up 10 lbs at HF clinic visit)       ICM remote transmission check for HF clinic as requested by Caffie Shed, PA.  Attempted call to patient and unable to reach.  Transmission results reviewed.    CorVue thoracic impedance suggesting fluid levels returned to normal 06/24/2024.   Was suggesting possible fluid accumulation from 06/05/2024-06/23/2024 as addressed by HF clinic.   Prescribed:  Torsemide  20 mg take 2 tablet(s) (40 mg total) by mouth twice a day.   Potassium 20 mEq take 3 tablet(s) (60 mEq total) by mouth daily. Spironolactone  25 mg take 1 tablet daily  Labs: 07/03/2024 Creatinine 1.53, BUN 19, Potassium 4.4, Sodium 136, GFR 50  06/22/2024 Creatinine 1.41, BUN 13, Potassium 3.4, Sodium 140, GFR 56  05/11/2024 Creatinine 1.50, BUN 18, Potassium 3.7, Sodium 137, GFR 52  A complete set of results can be found in Results Review.  Recommendations: Unable to reach.   Copy sent to Caffie Shed, PA as requested following 06/22/2024 HF clinic visit  Follow-up plan: No further ICM clinic phone appointments.   91 day device clinic remote transmission 08/09/2024.    EP/Cardiology Office Visits: 09/11/2024 with Charlies Arthur, PA.    Copy of ICM check sent to Dr. Inocencio.    Remote monitoring is medically necessary for Heart Failure Management.    Daily Thoracic Impedance ICM trend: 04/05/2024 through 07/04/2024.    12-14 Month Thoracic Impedance ICM trend:     Mitzie GORMAN Garner, RN 07/04/2024 8:08 AM

## 2024-07-04 NOTE — Progress Notes (Signed)
  Received: Today Allayne Butcher, PA-C  Durell Lofaso, Josephine Igo, RN Thank you!

## 2024-07-04 NOTE — Telephone Encounter (Signed)
 Remote ICM transmission received.  Attempted call to patient regarding ICM remote transmission and no answer.

## 2024-07-05 ENCOUNTER — Other Ambulatory Visit (HOSPITAL_COMMUNITY): Payer: Self-pay

## 2024-07-07 ENCOUNTER — Ambulatory Visit (HOSPITAL_COMMUNITY): Admitting: Physician Assistant

## 2024-07-07 ENCOUNTER — Other Ambulatory Visit (HOSPITAL_COMMUNITY): Payer: Self-pay

## 2024-07-14 ENCOUNTER — Encounter: Payer: Self-pay | Admitting: Internal Medicine

## 2024-07-14 ENCOUNTER — Encounter: Payer: Self-pay | Admitting: Cardiology

## 2024-07-14 NOTE — Telephone Encounter (Signed)
 I am not seeing any results regarding device transmission sent through my chart on our end.  Last transmission on 07/04/24, patient says that was not what he is asking about.  He is going to email me a copy of what he is seeing on his end with questions.   Waiting for email.

## 2024-07-20 ENCOUNTER — Ambulatory Visit (HOSPITAL_COMMUNITY)
Admission: RE | Admit: 2024-07-20 | Discharge: 2024-07-20 | Disposition: A | Discharge status: Home / Self Care | Source: Ambulatory Visit | Attending: Physician Assistant | Admitting: Physician Assistant

## 2024-07-20 ENCOUNTER — Other Ambulatory Visit (HOSPITAL_COMMUNITY): Payer: Self-pay

## 2024-07-20 VITALS — BP 124/82 | HR 76 | Ht 71.0 in | Wt 207.4 lb

## 2024-07-20 DIAGNOSIS — I480 Paroxysmal atrial fibrillation: Secondary | ICD-10-CM

## 2024-07-20 DIAGNOSIS — D6869 Other thrombophilia: Secondary | ICD-10-CM | POA: Diagnosis not present

## 2024-07-20 DIAGNOSIS — I4891 Unspecified atrial fibrillation: Secondary | ICD-10-CM

## 2024-07-20 MED ORDER — NITROGLYCERIN 0.4 MG SL SUBL: 0.4000 mg | SUBLINGUAL_TABLET | SUBLINGUAL | 3 refills | Status: AC | PRN

## 2024-07-20 NOTE — Progress Notes (Signed)
 "   Primary Care Physician: Yolande Toribio MATSU, MD Primary Cardiologist: None Electrophysiologist: Fonda Kitty, MD  [facility]: Dr Cherrie  Referring Physician: Dr Kitty Glendia Sandoval Trevor is a 64 y.o. male with a history of OSA, CAD, CHF, MAI lung infection, VT s/p ICD, atrial fibrillation who presents for follow up in the [facility].  The patient was seen by Dr Kitty for increased burden of afib and was started on amiodarone  as a bridge to ablation. He is s/p afib ablation 06/09/24. Patient is on Xarelto  for stroke prevention.    Patient presents today for follow up for atrial fibrillation. He remains in SR today and feels well. He denies any interim symptoms of afib or device shocks. His groin sites are well healed. No bleeding issues on anticoagulation.   Today, he denies symptoms of palpitations, chest pain, shortness of breath, orthopnea, PND, lower extremity edema, dizziness, presyncope, syncope, bleeding, or neurologic sequela. The patient is tolerating medications without difficulties and is otherwise without complaint today.    Atrial Fibrillation Risk Factors:  he does have symptoms or diagnosis of sleep apnea. he does not have a history of rheumatic fever.   Atrial Fibrillation Management history:  Previous antiarrhythmic drugs: amiodarone   Previous cardioversions: none Previous ablations: 06/09/24 Anticoagulation history: Xarelto   ROS- All systems are reviewed and negative except as per the HPI above.  Past Medical History:  Diagnosis Date   Anxiety    Automatic implantable cardiac defibrillator [employer]    Analyze ST   Benign prostatic hypertrophy    Benzodiazepine dependence (HCC)    chronic   CAD (coronary artery disease)    Last cath 2/12. 3-v CAD. Failed PCI of distal RCAc  CATH [facility] 4/13 with DES to  LAD X2   CHF (congestive heart failure) (HCC)    Chronic back pain    lumbar stenosis   Chronic systolic heart failure (HCC)     EF 20-25%. s/p ST[employer] ICD   Depression    DJD (degenerative joint disease)    History of testicular cancer    Ischemic cardiomyopathy    Myocardial infarction [facility])    Narcotic dependence (HCC)    chronic   OSA on CPAP    Polycythemia, secondary    S/p hematology consultation for polycythema on 01/06/18.  S/p bone marrow biopsy on 12/31/17 that was negative for myeloproliferative disorder.  Feels secondary to sleep apnea, active smoking, testosterone  supplementation, nocturnal hypoxia   Substance abuse (HCC)    TIA (transient ischemic attack)    Vitamin B12 deficiency 07/22/2016    Current Outpatient Medications  Medication Sig Dispense Refill   acetaminophen  (TYLENOL ) 325 MG tablet Take 325 mg by mouth every 4 (four) hours as needed for mild pain (pain score 1-3) or moderate pain (pain score 4-6). (Patient taking differently: Take 325 mg by mouth as needed for mild pain (pain score 1-3) or moderate pain (pain score 4-6).)     AFRIN 12 HOUR 0.05 % nasal spray Place 2 sprays into both nostrils at bedtime.     albuterol  (VENTOLIN  HFA) 108 (90 Base) MCG/ACT inhaler Inhale 1 puff into the lungs every 4 (four) hours as needed for wheezing or shortness of breath.     aspirin  EC 81 MG tablet Take 81 mg by mouth daily as needed. Swallow whole. When pt remembers (Patient taking differently: Take 81 mg by mouth every morning. Swallow whole. When pt remembers)     carisoprodol (SOMA) 350  MG tablet Take 350 mg by mouth 2 (two) times daily as needed.     dapagliflozin  propanediol (FARXIGA ) 10 MG TABS tablet Take 1 tablet (10 mg total) by mouth daily before breakfast. 30 tablet 5   diclofenac  Sodium (VOLTAREN ) 1 % GEL Apply 1 Application topically daily as needed (pain).     lisinopril  (ZESTRIL ) 10 MG tablet Take 1 tablet (10 mg total) by mouth daily. 90 tablet 3   LORazepam (ATIVAN) 1 MG tablet Take 1 mg by mouth See admin instructions. 2 tabs in the morning and 1 tab in the evening, or however he  wants     magnesium  oxide (MAG-OX) 400 (240 Mg) MG tablet Take 1 tablet by mouth daily as needed. (Patient taking differently: Take 1 tablet by mouth every morning.)     metoprolol  succinate (TOPROL  XL) 25 MG 24 hr tablet Take 1 tablet (25 mg total) by mouth daily. 90 tablet 3   naloxone  (NARCAN ) nasal spray 4 mg/0.1 mL Place 1 spray into the nose once as needed (for opioid emergency). 1 each 2   nitroGLYCERIN  (NITROSTAT ) 0.4 MG SL tablet Place 1 tablet (0.4 mg total) under the tongue every 5 (five) minutes as needed for chest pain. 90 tablet 3   pantoprazole  (PROTONIX ) 40 MG tablet Take 40 mg by mouth daily.     PEPCID  COMPLETE 10-800-165 MG chewable tablet Chew 1 tablet by mouth daily as needed (for reflux). (Patient taking differently: Chew 1 tablet by mouth as needed (for reflux).)     potassium chloride  SA (KLOR-CON  M) 20 MEQ tablet Take 3 tablets (60 mEq total) by mouth daily.     rivaroxaban  (XARELTO ) 20 MG TABS tablet Take 1 tablet (20 mg total) by mouth daily with supper. 30 tablet 11   sildenafil  (VIAGRA ) 100 MG tablet Take 50 mg by mouth daily as needed.     spironolactone  (ALDACTONE ) 25 MG tablet Take 1 tablet (25 mg total) by mouth daily. 30 tablet 3   tadalafil  (CIALIS ) 20 MG tablet Take 10 mg by mouth daily as needed for erectile dysfunction.     testosterone  cypionate (DEPOTESTOSTERONE CYPIONATE) 200 MG/ML injection SMARTSIG:0.4 Milliliter(s) IM Once a Week     Testosterone  Cypionate 200 MG/ML KIT Inject 200 mg into the muscle every 14 (fourteen) days. 2 kit 0   torsemide  (DEMADEX ) 20 MG tablet Take 2 tablets (40 mg total) by mouth 2 (two) times daily. 120 tablet 11   traZODone  (DESYREL ) 100 MG tablet Take 200 mg by mouth at bedtime.     umeclidinium-vilanterol (ANORO ELLIPTA ) 62.5-25 MCG/ACT AEPB Inhale 1 puff into the lungs daily. 60 each 3   SOMA 250 MG tablet Take 350 mg by mouth 2 (two) times daily as needed.     varenicline (CHANTIX) 0.5 MG tablet Take 0.5 mg by mouth 2 (two)  times daily. (Patient not taking: Reported on 07/20/2024)     No current facility-administered medications for this encounter.    Physical Exam: BP 124/82   Pulse 76   Ht 5' 11 (1.803 m)   Wt 94.1 kg   BMI 28.93 kg/m   GEN: Well nourished, well developed in no acute distress CARDIAC: Regular rate and rhythm, no murmurs, rubs, gallops RESPIRATORY:  Clear to auscultation without rales, wheezing or rhonchi  ABDOMEN: Soft, non-tender, non-distended EXTREMITIES:  No edema; No deformity   Wt Readings from Last 3 Encounters:  07/20/24 94.1 kg  06/22/24 96.3 kg  06/09/24 88.5 kg     EKG  Interpretation Date/Time:  Thursday July 20 2024 15:07:33 EST Ventricular Rate:  76 PR Interval:  162 QRS Duration:  126 QT Interval:  414 QTC Calculation: 465 R Axis:   103  Text Interpretation: Normal sinus rhythm Rightward axis Non-specific intra-ventricular conduction block Nonspecific T wave abnormality Abnormal ECG When compared with ECG of 09-Jun-2024 11:49, No significant change was found Confirmed by Maritsa Hunsucker ([id number]) on 07/20/2024 3:39:50 PM    Echo 05/19/24 demonstrated   1. Global hypokinesis with akinesis of the inferolateral wall; overall  moderate to severe LV dysfunction.   2. Left ventricular ejection fraction, by estimation, is 30 to 35%. Left  ventricular ejection fraction by 3D volume is 31 %. Left ventricular  ejection fraction by 2D MOD biplane is 34.0 %. The left ventricle has  moderate to severely decreased function.  The left ventricle demonstrates regional wall motion abnormalities (see  scoring diagram/findings for description). The left ventricular internal  cavity size was severely dilated. Left ventricular diastolic parameters  are consistent with Grade I diastolic   dysfunction (impaired relaxation). The average left ventricular global  longitudinal strain is -12.2 %. The global longitudinal strain is  abnormal.   3. Right ventricular systolic function is  normal. The right ventricular  size is mildly enlarged. There is normal pulmonary artery systolic  pressure. The estimated right ventricular systolic pressure is 26.3 mmHg.   4. Left atrial size was moderately dilated.   5. The mitral valve is degenerative. Moderate mitral valve regurgitation.  No evidence of mitral stenosis.   6. The aortic valve is tricuspid. Aortic valve regurgitation is not  visualized. No aortic stenosis is present.   7. The inferior vena cava is dilated in size with >50% respiratory  variability, suggesting right atrial pressure of 8 mmHg.    CHA2DS2-VASc Score = 5  The patient's score is based upon: CHF History: 1 HTN History: 1 Diabetes History: 0 Stroke History: 2 Vascular Disease History: 1 Age Score: 0 Gender Score: 0       ASSESSMENT AND PLAN: Paroxysmal Atrial Fibrillation (ICD10:  I48.0) The patient's CHA2DS2-VASc score is 5, indicating a 7.2% annual risk of stroke.   S/p afib ablation 06/09/24 Patient appears to be maintaining SR Will stop amiodarone  today. Continue Xarelto  20 mg daily with no missed doses for 3 months post ablation.  Continue Toprol  25 mg daily  Secondary Hypercoagulable State (ICD10:  D68.69) The patient is at significant risk for stroke/thromboembolism based upon his CHA2DS2-VASc Score of 5.  Continue Rivaroxaban  (Xarelto ). No bleeding issues.   Chronic HFrEF EF 30-35% GDMT per [facility] team Fluid status appears stable today  CAD No anginal symptoms Followed by Dr Cherrie  VT S/p ICD Followed by Dr Kennyth  OSA  Encouraged nightly CPAP   Follow up with Charlies Arthur as scheduled.     [facility] [address] [place], [place] [address] [phone] "

## 2024-08-04 ENCOUNTER — Other Ambulatory Visit: Payer: Self-pay

## 2024-08-09 ENCOUNTER — Ambulatory Visit: Payer: BC Managed Care – PPO

## 2024-08-09 DIAGNOSIS — I255 Ischemic cardiomyopathy: Secondary | ICD-10-CM

## 2024-08-09 LAB — CUP PACEART REMOTE DEVICE CHECK
Battery Remaining Longevity: 74 mo
Battery Remaining Percentage: 71 %
Battery Voltage: 2.99 V
Brady Statistic AP VP Percent: 0 %
Brady Statistic AP VS Percent: 1 %
Brady Statistic AS VP Percent: 1 %
Brady Statistic AS VS Percent: 99 %
Brady Statistic RA Percent Paced: 1 %
Brady Statistic RV Percent Paced: 1 %
Date Time Interrogation Session: 20260128020851
HighPow Impedance: 73 Ohm
Implantable Lead Connection Status: 753985
Implantable Lead Connection Status: 753985
Implantable Lead Implant Date: 20120201
Implantable Lead Implant Date: 20120201
Implantable Lead Location: 753859
Implantable Lead Location: 753860
Implantable Pulse Generator Implant Date: 20230131
Lead Channel Impedance Value: 400 Ohm
Lead Channel Impedance Value: 450 Ohm
Lead Channel Pacing Threshold Amplitude: 0.75 V
Lead Channel Pacing Threshold Amplitude: 1.25 V
Lead Channel Pacing Threshold Pulse Width: 0.5 ms
Lead Channel Pacing Threshold Pulse Width: 0.7 ms
Lead Channel Sensing Intrinsic Amplitude: 12 mV
Lead Channel Sensing Intrinsic Amplitude: 5 mV
Lead Channel Setting Pacing Amplitude: 2 V
Lead Channel Setting Pacing Amplitude: 3 V
Lead Channel Setting Pacing Pulse Width: 0.7 ms
Lead Channel Setting Sensing Sensitivity: 0.5 mV
Pulse Gen Serial Number: 111050201
Zone Setting Status: 755011

## 2024-08-10 ENCOUNTER — Other Ambulatory Visit (HOSPITAL_COMMUNITY): Payer: Self-pay

## 2024-08-13 ENCOUNTER — Ambulatory Visit: Payer: Self-pay | Admitting: Cardiology

## 2024-08-17 NOTE — Progress Notes (Signed)
 Remote ICD Transmission

## 2024-09-11 ENCOUNTER — Ambulatory Visit: Admitting: Physician Assistant

## 2024-11-08 ENCOUNTER — Ambulatory Visit

## 2025-02-07 ENCOUNTER — Ambulatory Visit

## 2025-05-09 ENCOUNTER — Ambulatory Visit

## 2025-08-08 ENCOUNTER — Ambulatory Visit

## 2025-11-07 ENCOUNTER — Ambulatory Visit
